# Patient Record
Sex: Female | Born: 1945 | Race: Black or African American | Hispanic: No | Smoking: Never smoker
Health system: Southern US, Community
[De-identification: ages and names within clinical notes are randomized; demographics above are authoritative.]

## PROBLEM LIST (undated history)

## (undated) ENCOUNTER — Emergency Department (HOSPITAL_COMMUNITY): Admission: EM | Payer: Medicare HMO

## (undated) DIAGNOSIS — G459 Transient cerebral ischemic attack, unspecified: Secondary | ICD-10-CM

## (undated) DIAGNOSIS — J309 Allergic rhinitis, unspecified: Secondary | ICD-10-CM

## (undated) DIAGNOSIS — E559 Vitamin D deficiency, unspecified: Secondary | ICD-10-CM

## (undated) DIAGNOSIS — R040 Epistaxis: Secondary | ICD-10-CM

## (undated) DIAGNOSIS — M85852 Other specified disorders of bone density and structure, left thigh: Secondary | ICD-10-CM

## (undated) DIAGNOSIS — I10 Essential (primary) hypertension: Secondary | ICD-10-CM

## (undated) DIAGNOSIS — M1A9XX0 Chronic gout, unspecified, without tophus (tophi): Secondary | ICD-10-CM

## (undated) DIAGNOSIS — E441 Mild protein-calorie malnutrition: Secondary | ICD-10-CM

## (undated) DIAGNOSIS — K5901 Slow transit constipation: Secondary | ICD-10-CM

## (undated) DIAGNOSIS — E162 Hypoglycemia, unspecified: Secondary | ICD-10-CM

## (undated) DIAGNOSIS — M1990 Unspecified osteoarthritis, unspecified site: Secondary | ICD-10-CM

## (undated) DIAGNOSIS — D509 Iron deficiency anemia, unspecified: Secondary | ICD-10-CM

## (undated) DIAGNOSIS — F22 Delusional disorders: Secondary | ICD-10-CM

## (undated) DIAGNOSIS — K5790 Diverticulosis of intestine, part unspecified, without perforation or abscess without bleeding: Secondary | ICD-10-CM

## (undated) DIAGNOSIS — Z950 Presence of cardiac pacemaker: Secondary | ICD-10-CM

## (undated) DIAGNOSIS — Z7901 Long term (current) use of anticoagulants: Secondary | ICD-10-CM

## (undated) DIAGNOSIS — G4730 Sleep apnea, unspecified: Secondary | ICD-10-CM

## (undated) DIAGNOSIS — I509 Heart failure, unspecified: Secondary | ICD-10-CM

## (undated) DIAGNOSIS — I4891 Unspecified atrial fibrillation: Secondary | ICD-10-CM

## (undated) DIAGNOSIS — K922 Gastrointestinal hemorrhage, unspecified: Secondary | ICD-10-CM

## (undated) DIAGNOSIS — D6859 Other primary thrombophilia: Secondary | ICD-10-CM

## (undated) DIAGNOSIS — K220 Achalasia of cardia: Secondary | ICD-10-CM

## (undated) DIAGNOSIS — N189 Chronic kidney disease, unspecified: Secondary | ICD-10-CM

## (undated) DIAGNOSIS — R1312 Dysphagia, oropharyngeal phase: Secondary | ICD-10-CM

## (undated) DIAGNOSIS — M069 Rheumatoid arthritis, unspecified: Secondary | ICD-10-CM

## (undated) DIAGNOSIS — M329 Systemic lupus erythematosus, unspecified: Secondary | ICD-10-CM

## (undated) DIAGNOSIS — I7300 Raynaud's syndrome without gangrene: Secondary | ICD-10-CM

## (undated) DIAGNOSIS — M1A00X0 Idiopathic chronic gout, unspecified site, without tophus (tophi): Secondary | ICD-10-CM

## (undated) DIAGNOSIS — F0390 Unspecified dementia without behavioral disturbance: Secondary | ICD-10-CM

## (undated) DIAGNOSIS — R569 Unspecified convulsions: Secondary | ICD-10-CM

## (undated) DIAGNOSIS — D649 Anemia, unspecified: Secondary | ICD-10-CM

## (undated) DIAGNOSIS — J8410 Pulmonary fibrosis, unspecified: Secondary | ICD-10-CM

## (undated) DIAGNOSIS — I429 Cardiomyopathy, unspecified: Secondary | ICD-10-CM

## (undated) DIAGNOSIS — Z9289 Personal history of other medical treatment: Secondary | ICD-10-CM

## (undated) HISTORY — DX: Epistaxis: R04.0

## (undated) HISTORY — DX: Iron deficiency anemia, unspecified: D50.9

## (undated) HISTORY — DX: Rheumatoid arthritis, unspecified: M06.9

## (undated) HISTORY — DX: Chronic gout, unspecified, without tophus (tophi): M1A.9XX0

## (undated) HISTORY — DX: Mild protein-calorie malnutrition: E44.1

## (undated) HISTORY — DX: Transient cerebral ischemic attack, unspecified: G45.9

## (undated) HISTORY — PX: HEMORRHOID SURGERY: SHX153

## (undated) HISTORY — DX: Delusional disorders: F22

## (undated) HISTORY — DX: Achalasia of cardia: K22.0

## (undated) HISTORY — DX: Slow transit constipation: K59.01

## (undated) HISTORY — DX: Hypoglycemia, unspecified: E16.2

## (undated) HISTORY — DX: Unspecified convulsions: R56.9

## (undated) HISTORY — DX: Gastrointestinal hemorrhage, unspecified: K92.2

## (undated) HISTORY — DX: Long term (current) use of anticoagulants: Z79.01

## (undated) HISTORY — DX: Other specified disorders of bone density and structure, left thigh: M85.852

## (undated) HISTORY — PX: PACEMAKER INSERTION: SHX728

## (undated) HISTORY — DX: Idiopathic chronic gout, unspecified site, without tophus (tophi): M1A.00X0

## (undated) HISTORY — PX: BREAST BIOPSY: SHX20

## (undated) HISTORY — PX: TONSILLECTOMY: SUR1361

## (undated) HISTORY — DX: Raynaud's syndrome without gangrene: I73.00

## (undated) HISTORY — DX: Allergic rhinitis, unspecified: J30.9

## (undated) HISTORY — PX: CARDIAC VALVE SURGERY: SHX40

## (undated) HISTORY — DX: Other primary thrombophilia: D68.59

## (undated) HISTORY — DX: Dysphagia, oropharyngeal phase: R13.12

## (undated) HISTORY — PX: CARPAL TUNNEL RELEASE: SHX101

---

## 2002-03-26 DIAGNOSIS — I359 Nonrheumatic aortic valve disorder, unspecified: Secondary | ICD-10-CM

## 2002-03-26 HISTORY — DX: Nonrheumatic aortic valve disorder, unspecified: I35.9

## 2017-08-18 DIAGNOSIS — I4729 Other ventricular tachycardia: Secondary | ICD-10-CM

## 2017-08-18 DIAGNOSIS — I442 Atrioventricular block, complete: Secondary | ICD-10-CM | POA: Insufficient documentation

## 2017-08-18 DIAGNOSIS — I10 Essential (primary) hypertension: Secondary | ICD-10-CM

## 2017-08-18 HISTORY — DX: Atrioventricular block, complete: I44.2

## 2017-08-18 HISTORY — DX: Essential (primary) hypertension: I10

## 2017-08-18 HISTORY — DX: Other ventricular tachycardia: I47.29

## 2017-12-12 DIAGNOSIS — M25552 Pain in left hip: Secondary | ICD-10-CM

## 2017-12-12 HISTORY — DX: Pain in left hip: M25.552

## 2019-11-14 DIAGNOSIS — Z8679 Personal history of other diseases of the circulatory system: Secondary | ICD-10-CM

## 2019-11-14 DIAGNOSIS — E7849 Other hyperlipidemia: Secondary | ICD-10-CM

## 2019-11-14 DIAGNOSIS — I080 Rheumatic disorders of both mitral and aortic valves: Secondary | ICD-10-CM | POA: Insufficient documentation

## 2019-11-14 DIAGNOSIS — Z95810 Presence of automatic (implantable) cardiac defibrillator: Secondary | ICD-10-CM

## 2019-11-14 HISTORY — DX: Other hyperlipidemia: E78.49

## 2019-11-14 HISTORY — DX: Personal history of other diseases of the circulatory system: Z86.79

## 2019-11-14 HISTORY — DX: Rheumatic disorders of both mitral and aortic valves: I08.0

## 2020-03-10 DIAGNOSIS — A419 Sepsis, unspecified organism: Secondary | ICD-10-CM

## 2020-03-10 HISTORY — DX: Sepsis, unspecified organism: A41.9

## 2020-04-14 ENCOUNTER — Institutional Professional Consult (permissible substitution): Payer: Self-pay | Admitting: Pulmonary Disease

## 2020-05-01 ENCOUNTER — Ambulatory Visit (INDEPENDENT_AMBULATORY_CARE_PROVIDER_SITE_OTHER): Payer: Medicare HMO | Admitting: Pulmonary Disease

## 2020-05-01 ENCOUNTER — Other Ambulatory Visit: Payer: Self-pay

## 2020-05-01 ENCOUNTER — Encounter: Payer: Self-pay | Admitting: Pulmonary Disease

## 2020-05-01 DIAGNOSIS — I509 Heart failure, unspecified: Secondary | ICD-10-CM | POA: Insufficient documentation

## 2020-05-01 DIAGNOSIS — R809 Proteinuria, unspecified: Secondary | ICD-10-CM

## 2020-05-01 DIAGNOSIS — R928 Other abnormal and inconclusive findings on diagnostic imaging of breast: Secondary | ICD-10-CM | POA: Diagnosis not present

## 2020-05-01 DIAGNOSIS — M329 Systemic lupus erythematosus, unspecified: Secondary | ICD-10-CM | POA: Diagnosis not present

## 2020-05-01 DIAGNOSIS — I071 Rheumatic tricuspid insufficiency: Secondary | ICD-10-CM

## 2020-05-01 DIAGNOSIS — K5730 Diverticulosis of large intestine without perforation or abscess without bleeding: Secondary | ICD-10-CM

## 2020-05-01 DIAGNOSIS — K8020 Calculus of gallbladder without cholecystitis without obstruction: Secondary | ICD-10-CM | POA: Insufficient documentation

## 2020-05-01 DIAGNOSIS — I9589 Other hypotension: Secondary | ICD-10-CM | POA: Insufficient documentation

## 2020-05-01 DIAGNOSIS — D6869 Other thrombophilia: Secondary | ICD-10-CM

## 2020-05-01 DIAGNOSIS — E46 Unspecified protein-calorie malnutrition: Secondary | ICD-10-CM | POA: Insufficient documentation

## 2020-05-01 DIAGNOSIS — I4891 Unspecified atrial fibrillation: Secondary | ICD-10-CM | POA: Insufficient documentation

## 2020-05-01 DIAGNOSIS — D8481 Immunodeficiency due to conditions classified elsewhere: Secondary | ICD-10-CM | POA: Insufficient documentation

## 2020-05-01 DIAGNOSIS — E261 Secondary hyperaldosteronism: Secondary | ICD-10-CM

## 2020-05-01 DIAGNOSIS — E2749 Other adrenocortical insufficiency: Secondary | ICD-10-CM

## 2020-05-01 DIAGNOSIS — E559 Vitamin D deficiency, unspecified: Secondary | ICD-10-CM

## 2020-05-01 DIAGNOSIS — N183 Chronic kidney disease, stage 3 unspecified: Secondary | ICD-10-CM

## 2020-05-01 DIAGNOSIS — J84112 Idiopathic pulmonary fibrosis: Secondary | ICD-10-CM

## 2020-05-01 DIAGNOSIS — I5032 Chronic diastolic (congestive) heart failure: Secondary | ICD-10-CM

## 2020-05-01 DIAGNOSIS — D638 Anemia in other chronic diseases classified elsewhere: Secondary | ICD-10-CM | POA: Diagnosis not present

## 2020-05-01 DIAGNOSIS — M169 Osteoarthritis of hip, unspecified: Secondary | ICD-10-CM

## 2020-05-01 DIAGNOSIS — I700 Atherosclerosis of aorta: Secondary | ICD-10-CM | POA: Insufficient documentation

## 2020-05-01 DIAGNOSIS — D849 Immunodeficiency, unspecified: Secondary | ICD-10-CM | POA: Insufficient documentation

## 2020-05-01 DIAGNOSIS — K219 Gastro-esophageal reflux disease without esophagitis: Secondary | ICD-10-CM

## 2020-05-01 DIAGNOSIS — I10 Essential (primary) hypertension: Secondary | ICD-10-CM

## 2020-05-01 DIAGNOSIS — I480 Paroxysmal atrial fibrillation: Secondary | ICD-10-CM | POA: Insufficient documentation

## 2020-05-01 DIAGNOSIS — M351 Other overlap syndromes: Secondary | ICD-10-CM

## 2020-05-01 DIAGNOSIS — M179 Osteoarthritis of knee, unspecified: Secondary | ICD-10-CM

## 2020-05-01 DIAGNOSIS — M174 Other bilateral secondary osteoarthritis of knee: Secondary | ICD-10-CM

## 2020-05-01 DIAGNOSIS — I429 Cardiomyopathy, unspecified: Secondary | ICD-10-CM | POA: Insufficient documentation

## 2020-05-01 DIAGNOSIS — M1611 Unilateral primary osteoarthritis, right hip: Secondary | ICD-10-CM

## 2020-05-01 DIAGNOSIS — E441 Mild protein-calorie malnutrition: Secondary | ICD-10-CM

## 2020-05-01 HISTORY — DX: Chronic kidney disease, stage 3 unspecified: N18.30

## 2020-05-01 HISTORY — DX: Osteoarthritis of hip, unspecified: M16.9

## 2020-05-01 HISTORY — DX: Rheumatic tricuspid insufficiency: I07.1

## 2020-05-01 HISTORY — DX: Immunodeficiency due to conditions classified elsewhere: D84.81

## 2020-05-01 HISTORY — DX: Unspecified atrial fibrillation: I48.91

## 2020-05-01 HISTORY — DX: Other thrombophilia: D68.69

## 2020-05-01 HISTORY — DX: Diverticulosis of large intestine without perforation or abscess without bleeding: K57.30

## 2020-05-01 HISTORY — DX: Idiopathic pulmonary fibrosis: J84.112

## 2020-05-01 HISTORY — DX: Proteinuria, unspecified: R80.9

## 2020-05-01 HISTORY — DX: Other adrenocortical insufficiency: E27.49

## 2020-05-01 HISTORY — DX: Systemic lupus erythematosus, unspecified: M32.9

## 2020-05-01 HISTORY — DX: Atherosclerosis of aorta: I70.0

## 2020-05-01 HISTORY — DX: Chronic diastolic (congestive) heart failure: I50.32

## 2020-05-01 HISTORY — DX: Osteoarthritis of knee, unspecified: M17.9

## 2020-05-01 HISTORY — DX: Calculus of gallbladder without cholecystitis without obstruction: K80.20

## 2020-05-01 HISTORY — DX: Gastro-esophageal reflux disease without esophagitis: K21.9

## 2020-05-01 HISTORY — DX: Secondary hyperaldosteronism: E26.1

## 2020-05-01 LAB — CBC
HCT: 26.3 % — ABNORMAL LOW (ref 36.0–46.0)
Hemoglobin: 8.8 g/dL — ABNORMAL LOW (ref 12.0–15.0)
MCHC: 33.6 g/dL (ref 30.0–36.0)
MCV: 93.8 fl (ref 78.0–100.0)
Platelets: 165 10*3/uL (ref 150.0–400.0)
RBC: 2.8 Mil/uL — ABNORMAL LOW (ref 3.87–5.11)
RDW: 16.4 % — ABNORMAL HIGH (ref 11.5–15.5)
WBC: 5.9 10*3/uL (ref 4.0–10.5)

## 2020-05-01 NOTE — Patient Instructions (Signed)
Nice to meet you  We will get blood work today to make sure your blood count is okay given the report of dark stool  Please schedule a 6-minute walk test at your convenience in the coming days when you check out.  This will check if your oxygen is dropping, I will also be useful to follow how far you walk if we need to do additional things in the future.  Please make sure to bring your Rollator and use it during this walk.  Return to clinic or follow-up with [name] in 3 months, or sooner as needed

## 2020-05-04 NOTE — Progress Notes (Signed)
@Patient  ID: , female    DOB: 02-02-1945, 75 y.o.   MRN: [id number]  Chief Complaint  Patient presents with   Consult    Winded easily    Referring provider: No ref. provider found  HPI:   75 year old woman whom I am seeing in consultation at the request of self for evaluation and ongoing care of multiple pulmonary issues.  PCP note reviewed.  Patient recently [place] the area from [place].  Had multiple specialist including cardiology, pulmonary.  Chronic dyspnea for some time.  Diagnosed with RA related ILD.  Started on Ofev in the past.  Continues this.  Occasional diarrhea.  Dyspnea worse on inclines or stairs.  Present on flat surfaces over short distances as well.  No time of day when things are better or worse.  Dyspnea constant.  No seasonal environmental factors she can identify that make this better or worse.  No position make things better or worse.  No clear alleviating exacerbating factors.  She continues on inhalers.  Currently Trelegy.  PMH: ILD, valvular issues, heart issues, Surgical history: Heart valve surgery, pacemaker placement, tonsillectomy Family history: Mother had CAD, hypertension Father with CAD, hypertension Social history: Never smoker, recently moved to the area, lives in [place] / Pulmonary Flowsheets:   ACT:  No flowsheet data found.  MMRC: No flowsheet data found.  Epworth:  No flowsheet data found.  Tests:   FENO:  No results found for: NITRICOXIDE  PFT: No flowsheet data found.  WALK:  No flowsheet data found.  Imaging: None reviewed, requested  Lab Results:  CBC Personally reviewed most recent labs from PCP with minimal records sent    Component Value Date/Time   WBC 5.9 05/01/2020 1124   RBC 2.80 (L) 05/01/2020 1124   HGB 8.8 Repeated and verified X2. (L) 05/01/2020 1124   HCT 26.3 Repeated and verified X2. (L) 05/01/2020 1124   PLT 165.0 05/01/2020 1124   MCV 93.8 05/01/2020 1124    MCHC 33.6 05/01/2020 1124   RDW 16.4 (H) 05/01/2020 1124    BMET No results found for: NA, K, CL, CO2, GLUCOSE, BUN, CREATININE, CALCIUM, GFRNONAA, GFRAA  BNP No results found for: BNP  ProBNP No results found for: PROBNP  Specialty Problems       Pulmonary Problems   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HCC)       Not on File   There is no immunization history on file for this patient.  History reviewed. No pertinent past medical history.  Tobacco History: Social History   Tobacco Use  Smoking Status Never Smoker  Smokeless Tobacco Never Used   Counseling given: Yes   Continue to not smoke  Outpatient Encounter Medications as of 05/01/2020  Medication Sig   acetaminophen (TYLENOL 8 HOUR) 650 MG CR tablet Take 650 mg by mouth every 8 (eight) hours as needed for pain.   albuterol (VENTOLIN HFA) 108 (90 Base) MCG/ACT inhaler Inhale 2 puffs into the lungs every 6 (six) hours as needed for wheezing or shortness of breath.   allopurinol (ZYLOPRIM) 100 MG tablet Take 100 mg by mouth daily.   apixaban (ELIQUIS) 2.5 MG TABS tablet Take 2.5 mg by mouth 2 (two) times daily.   calcium carbonate (OSCAL) 1500 (600 Ca) MG TABS tablet Take by mouth 2 (two) times daily with a meal.   cetirizine HCl (ZYRTEC) 5 MG/5ML SOLN Take 5 mg by mouth daily.   feeding supplement (ENSURE IMMUNE HEALTH) LIQD Take 237 mLs  by mouth 3 (three) times daily with meals.   fluticasone (FLONASE) 50 MCG/ACT nasal spray Place 2 sprays into both nostrils daily.   Fluticasone-Umeclidin-Vilant (TRELEGY ELLIPTA) 100-62.5-25 MCG/INH AEPB Inhale into the lungs.   folic acid (FOLVITE) 800 MCG tablet Take 400 mcg by mouth daily.   furosemide (LASIX) 20 MG tablet Take 20 mg by mouth.   hydrocortisone (CORTEF) 5 MG tablet Take 5 mg by mouth 2 (two) times daily.   hydroxychloroquine (PLAQUENIL) 200 MG tablet Take 200 mg by mouth daily.   levothyroxine (SYNTHROID) 75 MCG tablet Take 75 mcg by mouth daily before  breakfast.   metoprolol tartrate (LOPRESSOR) 25 MG tablet Take 25 mg by mouth daily.   Multiple Vitamin (MULTIVITAMIN) LIQD Take 5 mLs by mouth daily.   Nintedanib (OFEV) 150 MG CAPS Take 150 mg by mouth 2 (two) times daily.   [DISCONTINUED] hydrocortisone cream 0.5 % Apply 1 application topically 2 (two) times daily.   No facility-administered encounter medications on file as of 05/01/2020.     Review of Systems  Review of Systems  No chest pain with exertion.  No orthopnea or PND.  She endorses lower extremity swelling that fluctuates.  Comprehensive review of systems otherwise negative. Physical Exam  BP 118/74 (BP Location: Left Arm, Cuff Size: Normal)   Pulse 71   Temp 99 F (37.2 C) (Oral)   Ht 5' 3.5" (1.613 m)   Wt 128 lb 6.4 oz (58.2 kg)   SpO2 99%   BMI 22.39 kg/m   Wt Readings from Last 5 Encounters:  05/01/20 128 lb 6.4 oz (58.2 kg)    BMI Readings from Last 5 Encounters:  05/01/20 22.39 kg/m     Physical Exam General: Frail, sitting in chair Eyes: EOMI, no icterus Neck: Supple, no JVP Cardiovascular: Significant 3/6 late systolic murmur heard best right upper sternal border, regular rate Pulmonary: Crackles in bilateral bases, otherwise clear to auscultation bilaterally Abdomen: Nondistended, bowel sounds present MSK: No synovitis, joint effusion Neuro: Ambulates with assistance of walker, otherwise no weakness Psych: Normal mood, full affect   Assessment & Plan:   ILD: Presumed IPF, on Ofev.  To continue this.  Consider repeat CT scan in future if symptoms worsen.  We will need to track down prior films for comparison.  6-minute walk test ordered to be completed in the coming days to assess for hypoxemia and as baseline so he can compare in future if symptoms worsen.  Dyspnea exertion: Related to ILD, pulmonary hypertension.  On therapy for ILD as above.  Continue Trelegy.  Consider PFTs in the future if symptoms worsen.  Pulmonary hypertension:  Presumed based on prior TTE.  Never on therapy.  Denies right heart cath.  Reportedly significant valvular disease, like combination  of group 2 and group 3.  She is poor candidate for therapy based on her age, frailty.  Do not see a reason to pursue right heart cath at this time but can reconsider in the future.  Patient presented with release of records, this was signed and filled out today requested prior notes, radiology reports, films, PFTs etc.  We will look for these results in the coming days to weeks.  Will need to be scanned into the media tab so can reference them in the future.   Return in about 3 months (around 07/31/2020).   Karren Burly, MD 05/04/2020

## 2020-05-12 ENCOUNTER — Emergency Department (HOSPITAL_COMMUNITY)
Admission: EM | Admit: 2020-05-12 | Discharge: 2020-05-12 | Disposition: A | Discharge status: Home / Self Care | Payer: Medicare HMO | Attending: Emergency Medicine | Admitting: Emergency Medicine

## 2020-05-12 ENCOUNTER — Other Ambulatory Visit: Payer: Self-pay

## 2020-05-12 ENCOUNTER — Encounter (HOSPITAL_COMMUNITY): Payer: Self-pay | Admitting: *Deleted

## 2020-05-12 ENCOUNTER — Emergency Department (HOSPITAL_COMMUNITY): Payer: Medicare HMO

## 2020-05-12 DIAGNOSIS — N183 Chronic kidney disease, stage 3 unspecified: Secondary | ICD-10-CM | POA: Diagnosis not present

## 2020-05-12 DIAGNOSIS — I509 Heart failure, unspecified: Secondary | ICD-10-CM | POA: Diagnosis not present

## 2020-05-12 DIAGNOSIS — Z7901 Long term (current) use of anticoagulants: Secondary | ICD-10-CM | POA: Insufficient documentation

## 2020-05-12 DIAGNOSIS — I4891 Unspecified atrial fibrillation: Secondary | ICD-10-CM | POA: Insufficient documentation

## 2020-05-12 DIAGNOSIS — I130 Hypertensive heart and chronic kidney disease with heart failure and stage 1 through stage 4 chronic kidney disease, or unspecified chronic kidney disease: Secondary | ICD-10-CM | POA: Insufficient documentation

## 2020-05-12 DIAGNOSIS — Z79899 Other long term (current) drug therapy: Secondary | ICD-10-CM | POA: Diagnosis not present

## 2020-05-12 DIAGNOSIS — Z4502 Encounter for adjustment and management of automatic implantable cardiac defibrillator: Secondary | ICD-10-CM | POA: Insufficient documentation

## 2020-05-12 DIAGNOSIS — Z7189 Other specified counseling: Secondary | ICD-10-CM

## 2020-05-12 HISTORY — DX: Pulmonary fibrosis, unspecified: J84.10

## 2020-05-12 HISTORY — DX: Chronic kidney disease, unspecified: N18.9

## 2020-05-12 HISTORY — DX: Unspecified atrial fibrillation: I48.91

## 2020-05-12 HISTORY — DX: Cardiomyopathy, unspecified: I42.9

## 2020-05-12 HISTORY — DX: Essential (primary) hypertension: I10

## 2020-05-12 HISTORY — DX: Diverticulosis of intestine, part unspecified, without perforation or abscess without bleeding: K57.90

## 2020-05-12 HISTORY — DX: Vitamin D deficiency, unspecified: E55.9

## 2020-05-12 HISTORY — DX: Systemic lupus erythematosus, unspecified: M32.9

## 2020-05-12 HISTORY — DX: Unspecified osteoarthritis, unspecified site: M19.90

## 2020-05-12 HISTORY — DX: Anemia, unspecified: D64.9

## 2020-05-12 HISTORY — DX: Heart failure, unspecified: I50.9

## 2020-05-12 LAB — TROPONIN I (HIGH SENSITIVITY)
Troponin I (High Sensitivity): 66 ng/L — ABNORMAL HIGH (ref <18)
Troponin I (High Sensitivity): 61 ng/L — ABNORMAL HIGH (ref <18)

## 2020-05-12 LAB — CBC WITH DIFFERENTIAL/PLATELET
Abs Immature Granulocytes: 0.03 10*3/uL (ref 0.00–0.07)
Basophils Absolute: 0 10*3/uL (ref 0.0–0.1)
Basophils Relative: 0 %
Eosinophils Absolute: 0.1 10*3/uL (ref 0.0–0.5)
Eosinophils Relative: 1 %
HCT: 27.6 % — ABNORMAL LOW (ref 36.0–46.0)
Hemoglobin: 8.8 g/dL — ABNORMAL LOW (ref 12.0–15.0)
Immature Granulocytes: 1 %
Lymphocytes Relative: 26 %
Lymphs Abs: 1.6 10*3/uL (ref 0.7–4.0)
MCH: 31.2 pg (ref 26.0–34.0)
MCHC: 31.9 g/dL (ref 30.0–36.0)
MCV: 97.9 fL (ref 80.0–100.0)
Monocytes Absolute: 0.5 10*3/uL (ref 0.1–1.0)
Monocytes Relative: 7 %
Neutro Abs: 4.1 10*3/uL (ref 1.7–7.7)
Neutrophils Relative %: 65 %
Platelets: 121 10*3/uL — ABNORMAL LOW (ref 150–400)
RBC: 2.82 MIL/uL — ABNORMAL LOW (ref 3.87–5.11)
RDW: 16.1 % — ABNORMAL HIGH (ref 11.5–15.5)
WBC: 6.4 10*3/uL (ref 4.0–10.5)
nRBC: 0 % (ref 0.0–0.2)

## 2020-05-12 LAB — BASIC METABOLIC PANEL
Anion gap: 8 (ref 5–15)
BUN: 17 mg/dL (ref 8–23)
CO2: 27 mmol/L (ref 22–32)
Calcium: 9.2 mg/dL (ref 8.9–10.3)
Chloride: 102 mmol/L (ref 98–111)
Creatinine, Ser: 0.96 mg/dL (ref 0.44–1.00)
GFR, Estimated: >60 mL/min (ref >60)
Glucose, Bld: 83 mg/dL (ref 70–99)
Potassium: 3.4 mmol/L — ABNORMAL LOW (ref 3.5–5.1)
Sodium: 137 mmol/L (ref 135–145)

## 2020-05-12 LAB — BRAIN NATRIURETIC PEPTIDE: B Natriuretic Peptide: 567.7 pg/mL — ABNORMAL HIGH (ref 0.0–100.0)

## 2020-05-12 LAB — MAGNESIUM: Magnesium: 1.8 mg/dL (ref 1.7–2.4)

## 2020-05-12 MED ORDER — FUROSEMIDE 10 MG/ML IJ SOLN
20.0000 mg | Freq: Once | INTRAMUSCULAR | Status: AC
  Administered 2020-05-12: 20 mg via INTRAVENOUS
  Filled 2020-05-12: qty 2

## 2020-05-12 NOTE — ED Provider Notes (Signed)
[facility] EMERGENCY DEPARTMENT Provider Note   CSN: [id number] Arrival date & time: 05/12/20  1615     History Chief Complaint  Patient presents with  . Illness    Lindsey Huber is a 75 y.o. female.  The history is provided by the patient.  Illness Location:  Pacemaker Severity:  Mild Onset quality:  Sudden Timing:  Unable to specify Progression:  Unable to specify Chronicity:  New Context:  Patient was notified by her cardiologist in [place] (just move here and to establish care with Dr. Leta Jungling next week) that her ICD was having an issue and instructed to come to ED. Patient told ICD was trying to fire but patient denies any symtpoms or shocks. Relieved by:  Nothing Worsened by:  Nothing Associated symptoms: no abdominal pain, no chest pain, no congestion, no cough, no diarrhea, no ear pain, no fatigue, no fever, no headaches, no loss of consciousness, no myalgias, no nausea, no rash, no rhinorrhea, no shortness of breath, no sore throat, no vomiting and no wheezing   Risk factors:  Cardiomyopathy, afib on blood thinner      Past Medical History:  Diagnosis Date  . A-fib (HCC)   . Anemia   . Cardiomyopathy (HCC)   . CHF (congestive heart failure) (HCC)   . CKD (chronic kidney disease)   . Diverticulosis   . Hypertension   . Lupus (HCC)   . Osteoarthritis   . Pulmonary fibrosis (HCC)   . Vitamin D deficiency     Patient Active Problem List   Diagnosis Date Noted  . 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  . Vitamin D deficiency 05/01/2020  . Mammogram abnormal 05/01/2020  . Proteinuria 05/01/2020  . Other bilateral secondary osteoarthritis of knee 05/01/2020  . Osteoarthritis of hip 05/01/2020  . HTN (hypertension) 05/01/2020  . GERD without esophagitis 05/01/2020  . 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HCC) 05/01/2020  . Cardiomyopathy (HCC) 05/01/2020  . Iatrogenic hypotension 05/01/2020  . Hypercoagulability due to atrial  fibrillation (HCC) 05/01/2020  . Systemic lupus erythematosus (HCC) 05/01/2020  . Chronic heart failure with preserved ejection fraction (HCC) 05/01/2020  . 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  . Paroxysmal atrial fibrillation with RVR (HCC) 05/01/2020  . Protein calorie malnutrition (HCC) 05/01/2020  . Chronic kidney disease (CKD), active medical management without dialysis, stage 3 (moderate) (HCC) 05/01/2020  . 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HCC) 05/01/2020  . Non-rheumatic atrial fibrillation (HCC) 05/01/2020  . Atherosclerosis of abdominal aorta (HCC) 05/01/2020  . Diverticulosis of colon 05/01/2020  . Cholelithiasis 05/01/2020  . 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 Anemia of chronic disease 05/01/2020    Past Surgical History:  Procedure Laterality Date  . PACEMAKER INSERTION       OB History   No obstetric history on file.     No family history on file.  Social History   Tobacco Use  . Smoking status: Never Smoker  . Smokeless tobacco: Never Used    Home Medications Prior to Admission medications   Medication Sig Start Date End Date Taking? Authorizing Provider  acetaminophen (TYLENOL 8 HOUR) 650 MG CR tablet Take 650 mg by mouth every 8 (eight) hours as needed for pain.    [provider]  albuterol (VENTOLIN HFA) 108 (90 Base) MCG/ACT inhaler Inhale 2 puffs into the lungs every 6 (six) hours as needed for wheezing or shortness of breath.    [provider]  allopurinol (ZYLOPRIM) 100 MG tablet Take 100 mg  by mouth daily.    [provider]  apixaban (ELIQUIS) 2.5 MG TABS tablet Take 2.5 mg by mouth 2 (two) times daily.    [provider]  calcium carbonate (OSCAL) 1500 (600 Ca) MG TABS tablet Take by mouth 2 (two) times daily with a meal.    [provider]  cetirizine HCl (ZYRTEC) 5 MG/5ML SOLN Take 5 mg by mouth daily.    [provider]   feeding supplement (ENSURE IMMUNE HEALTH) LIQD Take 237 mLs by mouth 3 (three) times daily with meals.    [provider]  fluticasone (FLONASE) 50 MCG/ACT nasal spray Place 2 sprays into both nostrils daily.    [provider]  Fluticasone-Umeclidin-Vilant (TRELEGY ELLIPTA) 100-62.5-25 MCG/INH AEPB Inhale into the lungs.    [provider]  folic acid (FOLVITE) 800 MCG tablet Take 400 mcg by mouth daily.    [provider]  furosemide (LASIX) 20 MG tablet Take 20 mg by mouth.    [provider]  hydrocortisone (CORTEF) 5 MG tablet Take 5 mg by mouth 2 (two) times daily.    [provider]  hydroxychloroquine (PLAQUENIL) 200 MG tablet Take 200 mg by mouth daily.    [provider]  levothyroxine (SYNTHROID) 75 MCG tablet Take 75 mcg by mouth daily before breakfast.    [provider]  metoprolol tartrate (LOPRESSOR) 25 MG tablet Take 25 mg by mouth daily.    [provider]  Multiple Vitamin (MULTIVITAMIN) LIQD Take 5 mLs by mouth daily.    [provider]  Nintedanib (OFEV) 150 MG CAPS Take 150 mg by mouth 2 (two) times daily.    [provider]    Allergies    Norvasc [amlodipine]  Review of Systems   Review of Systems  Constitutional: Negative for chills, fatigue and fever.  HENT: Negative for congestion, ear pain, rhinorrhea and sore throat.   Eyes: Negative for pain and visual disturbance.  Respiratory: Negative for cough, shortness of breath and wheezing.   Cardiovascular: Negative for chest pain and palpitations.  Gastrointestinal: Negative for abdominal pain, diarrhea, nausea and vomiting.  Genitourinary: Negative for dysuria and hematuria.  Musculoskeletal: Negative for arthralgias, back pain and myalgias.  Skin: Negative for color change and rash.  Neurological: Negative for seizures, loss of consciousness, syncope and headaches.  All other systems reviewed and are  negative.   Physical Exam Updated Vital Signs  ED Triage Vitals  Enc Vitals Group     BP 05/12/20 1620 (!) 152/75     Pulse Rate 05/12/20 1620 74     Resp 05/12/20 1620 17     Temp 05/12/20 1620 (!) 97.3 F (36.3 C)     Temp Source 05/12/20 1620 Oral     SpO2 05/12/20 1620 100 %     Weight --      Height --      Head Circumference --      Peak Flow --      Pain Score 05/12/20 1629 0     Pain Loc --      Pain Edu? --      Excl. in GC? --     Physical Exam Vitals and nursing note reviewed.  Constitutional:      General: She is not in acute distress.    Appearance: She is well-developed. She is not ill-appearing.  HENT:     Head: Normocephalic and atraumatic.     Nose: Nose normal.  Mouth/Throat:     Mouth: Mucous membranes are moist.  Eyes:     Extraocular Movements: Extraocular movements intact.     Conjunctiva/sclera: Conjunctivae normal.     Pupils: Pupils are equal, round, and reactive to light.  Cardiovascular:     Rate and Rhythm: Normal rate and regular rhythm.     Pulses: Normal pulses.     Heart sounds: Normal heart sounds. No murmur heard.   Pulmonary:     Effort: Pulmonary effort is normal. No respiratory distress.     Breath sounds: Normal breath sounds.  Abdominal:     Palpations: Abdomen is soft.     Tenderness: There is no abdominal tenderness.  Musculoskeletal:        General: Normal range of motion.     Cervical back: Normal range of motion and neck supple.  Skin:    General: Skin is warm and dry.     Capillary Refill: Capillary refill takes less than 2 seconds.  Neurological:     General: No focal deficit present.     Mental Status: She is alert and oriented to person, place, and time.     Cranial Nerves: No cranial nerve deficit.     Sensory: No sensory deficit.     Motor: No weakness.     Coordination: Coordination normal.     ED Results / Procedures / Treatments   Labs (all labs ordered are listed, but only abnormal results  are displayed) Labs Reviewed  CBC WITH DIFFERENTIAL/PLATELET - Abnormal; Notable for the following components:      Result Value   RBC 2.82 (*)    Hemoglobin 8.8 (*)    HCT 27.6 (*)    RDW 16.1 (*)    Platelets 121 (*)    All other components within normal limits  BASIC METABOLIC PANEL - Abnormal; Notable for the following components:   Potassium 3.4 (*)    All other components within normal limits  BRAIN NATRIURETIC PEPTIDE - Abnormal; Notable for the following components:   B Natriuretic Peptide 567.7 (*)    All other components within normal limits  TROPONIN I (HIGH SENSITIVITY) - Abnormal; Notable for the following components:   Troponin I (High Sensitivity) 66 (*)    All other components within normal limits  TROPONIN I (HIGH SENSITIVITY) - Abnormal; Notable for the following components:   Troponin I (High Sensitivity) 61 (*)    All other components within normal limits  MAGNESIUM    EKG EKG Interpretation  Date/Time:  Monday May 12 2020 16:24:44 EDT Ventricular Rate:  71 PR Interval:  336 QRS Duration: 168 QT Interval:  512 QTC Calculation: 556 R Axis:   -79 Text Interpretation: Atrial-sensed ventricular-paced rhythm with prolonged AV conduction Abnormal ECG No previous ECGs available Confirmed by Tilden Fossa [phone]) on 05/12/2020 4:36:49 PM   Radiology DG Chest 2 View  Result Date: 05/12/2020 CLINICAL DATA:  Arrhythmia EXAM: CHEST - 2 VIEW COMPARISON:  None. FINDINGS: Cardiac shadow is enlarged. Postsurgical changes and defibrillator are noted. Lungs are well aerated bilaterally. Mild vascular prominence is seen. Mild bibasilar atelectatic changes are noted. No bony abnormality is seen. IMPRESSION: Mild vascular congestion. Mild basilar atelectasis. Electronically Signed   By: Alcide Clever M.D.   On: 05/12/2020 16:55    Procedures Procedures   Medications Ordered in ED Medications  furosemide (LASIX) injection 20 mg (20 mg Intravenous Given 05/12/20 2100)     ED Course  I have reviewed the triage vital signs and  the nursing notes.  Pertinent labs & imaging results that were available during my care of the patient were reviewed by me and considered in my medical decision making (see chart for details).    MDM Rules/Calculators/A&P                          Jacqeline Broers is a 75 year old female with history of heart failure status post ICD, atrial fib on blood thinner who presents to the ED with ICD problem.  Patient with overall normal vitals.  She denies any chest pain or shortness of breath.  She was instructed to come here by her cardiologist who is in [place] as [employer] called about her ICD having an issue.  She currently moved here.  She is due to see cardiology next week here to establish care.  She is unsure of the ICD issue.  She denies any chest pain, shortness of breath, leg swelling.  [employer] device was interrogated.  Lab work was obtained.  Troponin stable x2.  These were in the 60s.  BNP mildly elevated at 560.  Otherwise lab work is unremarkable.  Chest x-ray unremarkable.  No signs of large volume overload on exam.  Vital signs are reassuring.  I was able to get the [employer] report and overall the main issue was that the OptiFlow portion of the machine was saying that patient might be volume overloaded as this has been collecting data since February.  Talked on the phone with cardiology about this and overall not significant given that she is asymptomatic.  I decided to give her a dose of Lasix and she will follow-up with cardiology next week.  Discharged in good condition.  No concern for other arrhythmia or ICD problem.  This chart was dictated using voice recognition software.  Despite best efforts to proofread,  errors can occur which can change the documentation meaning.    Final Clinical Impression(s) / ED Diagnoses Final diagnoses:  Encounter for discussion regarding implantable cardioverter-defibrillator (ICD)     Rx / DC Orders ED Discharge Orders    None       Virgina Norfolk, DO 05/12/20 2129

## 2020-05-12 NOTE — ED Triage Notes (Signed)
Pt reports that ICD company called her to come to ED due to abnormality noted in her cardiac rhythm. Pt has no complaints and no acute distress is noted.

## 2020-05-12 NOTE — ED Triage Notes (Signed)
Emergency Medicine Provider Triage Evaluation Note  Lindsey Huber , a 75 y.o. female  was evaluated in triage.  Pt complains of irregular heart rate. States that she has a pacemaker and was contacted by her cardiologist and told that her rhythm was abnormal. She did not feel her icd fire. States that she has been asymptomatic.  Review of Systems  Positive: Abnormal heart rhythm Negative: Chest pain, sob  Physical Exam  BP (!) 152/75 (BP Location: Left Arm)   Pulse 74   Temp (!) 97.3 F (36.3 C) (Oral)   Resp 17   SpO2 100%  Gen:   Awake, no distress   HEENT:  Atraumatic  Resp:  Normal effort, bibasilar rales Cardiac:  Normal rate  Abd:   Nondistended, nontender  MSK:   Moves extremities without difficulty  Neuro:  Speech clear   Medical Decision Making  Medically screening exam initiated at 6:34 PM.  Appropriate orders placed.  Lindsey Huber was informed that the remainder of the evaluation will be completed by another provider, this initial triage assessment does not replace that evaluation, and the importance of remaining in the ED until their evaluation is complete.  Clinical Impression   76 y/o f here with arrhytmia  MSE was initiated and I personally evaluated the patient and placed orders (if any) at  6:34 PM on May 12, 2020.  The patient appears stable so that the remainder of the MSE may be completed by another provider.    Karrie Meres, [place] 05/12/20 1834

## 2020-05-20 ENCOUNTER — Other Ambulatory Visit: Payer: Self-pay

## 2020-05-20 ENCOUNTER — Ambulatory Visit (INDEPENDENT_AMBULATORY_CARE_PROVIDER_SITE_OTHER): Payer: Medicare HMO | Admitting: Cardiology

## 2020-05-20 ENCOUNTER — Encounter: Payer: Self-pay | Admitting: Cardiology

## 2020-05-20 VITALS — BP 178/80 | HR 90 | Ht 63.5 in | Wt 118.6 lb

## 2020-05-20 DIAGNOSIS — R030 Elevated blood-pressure reading, without diagnosis of hypertension: Secondary | ICD-10-CM

## 2020-05-20 DIAGNOSIS — I480 Paroxysmal atrial fibrillation: Secondary | ICD-10-CM

## 2020-05-20 DIAGNOSIS — I429 Cardiomyopathy, unspecified: Secondary | ICD-10-CM | POA: Diagnosis not present

## 2020-05-20 DIAGNOSIS — Z95810 Presence of automatic (implantable) cardiac defibrillator: Secondary | ICD-10-CM

## 2020-05-20 DIAGNOSIS — D638 Anemia in other chronic diseases classified elsewhere: Secondary | ICD-10-CM | POA: Diagnosis not present

## 2020-05-20 MED ORDER — RIVAROXABAN 15 MG PO TABS: 15.0000 mg | ORAL_TABLET | Freq: Every day | ORAL | 11 refills | Status: DC

## 2020-05-20 NOTE — Progress Notes (Signed)
Electrophysiology Office Note:    Date:  05/20/2020   ID:  Lindsey Huber, DOB 1945-04-26, MRN [id number]  PCP:  Lindsey Mares, PA  [facility] Cardiologist:  No primary care provider on file.  [facility] Electrophysiologist:  Lindsey Prude, MD   Referring MD: Lindsey Mares, PA   Chief Complaint: ICD  History of Present Illness:    Lindsey Huber is a 75 y.o. female who presents for an evaluation of ICD at the request of [place], PA-C.  She has an extensive past medical history that includes cardiomyopathy, GERD, hypertension, idiopathic pulmonary fibrosis, paroxysmal atrial fibrillation and flutter post ablation, bioprosthetic aortic valve replacement, tricuspid valve replacement stage IIIa chronic kidney disease, SLE and anemia of chronic disease.  She presents today to establish care for her ICD.  She was previously seen in [facility] by cardiology.  She takes Eliquis for stroke prophylaxis.  She is with her daughter today in clinic.  She tells me that overall she is doing well since being discharged from her hospitalization for sepsis in February.  She has some of that she has intermittent nosebleeds on her Eliquis.  They last about 5 minutes but she is interested in trying another blood thinner to see if it helps.  She has never been on Xarelto.  Past Medical History:  Diagnosis Date  . A-fib (HCC)   . Anemia   . Cardiomyopathy (HCC)   . CHF (congestive heart failure) (HCC)   . CKD (chronic kidney disease)   . Diverticulosis   . Hypertension   . Lupus (HCC)   . Osteoarthritis   . Pulmonary fibrosis (HCC)   . Vitamin D deficiency     Past Surgical History:  Procedure Laterality Date  . PACEMAKER INSERTION      Current Medications: Current Meds  Medication Sig  . acetaminophen (TYLENOL) 650 MG CR tablet Take 650 mg by mouth every 8 (eight) hours as needed for pain.  Marland Kitchen albuterol (VENTOLIN HFA) 108 (90 Base) MCG/ACT inhaler  Inhale 2 puffs into the lungs every 6 (six) hours as needed for wheezing or shortness of breath.  . allopurinol (ZYLOPRIM) 100 MG tablet Take 100 mg by mouth daily.  . calcium carbonate (OSCAL) 1500 (600 Ca) MG TABS tablet Take by mouth 2 (two) times daily with a meal.  . cetirizine HCl (ZYRTEC) 5 MG/5ML SOLN Take 5 mg by mouth daily.  . Cholecalciferol 25 MCG (1000 UT) tablet Take 1 tablet by mouth daily.  . feeding supplement (ENSURE IMMUNE HEALTH) LIQD Take 237 mLs by mouth 3 (three) times daily with meals.  . fluticasone (FLONASE) 50 MCG/ACT nasal spray Place 2 sprays into both nostrils daily.  . Fluticasone-Umeclidin-Vilant (TRELEGY ELLIPTA) 100-62.5-25 MCG/INH AEPB Inhale into the lungs.  . folic acid (FOLVITE) 800 MCG tablet Take 400 mcg by mouth daily.  . furosemide (LASIX) 20 MG tablet Take 20 mg by mouth.  . hydrocortisone (CORTEF) 5 MG tablet Take 5 mg by mouth 2 (two) times daily.  . hydroxychloroquine (PLAQUENIL) 200 MG tablet Take 200 mg by mouth daily.  Marland Kitchen levothyroxine (SYNTHROID) 75 MCG tablet Take 75 mcg by mouth daily before breakfast.  . metoprolol tartrate (LOPRESSOR) 25 MG tablet Take 25 mg by mouth daily.  . Multiple Vitamin (MULTIVITAMIN) LIQD Take 5 mLs by mouth daily.  . Nintedanib (OFEV) 150 MG CAPS Take 150 mg by mouth 2 (two) times daily.  . Rivaroxaban (XARELTO) 15 MG TABS tablet Take 1 tablet (15 mg total)  by mouth daily with supper.  . senna-docusate (SENOKOT-S) 8.6-50 MG tablet Take 2 tablets by mouth as needed for irritation.  . [DISCONTINUED] apixaban (ELIQUIS) 2.5 MG TABS tablet Take 2.5 mg by mouth 2 (two) times daily.  . [DISCONTINUED] ELIQUIS 5 MG TABS tablet Take 1 tablet by mouth 2 (two) times daily.     Allergies:   Norvasc [amlodipine]   Social History   Socioeconomic History  . Marital status: Legally Separated    Spouse name: Not on file  . Number of children: Not on file  . Years of education: Not on file  . Highest education level: Not on  file  Occupational History  . Not on file  Tobacco Use  . Smoking status: Never Smoker  . Smokeless tobacco: Never Used  Substance and Sexual Activity  . Alcohol use: Not on file  . Drug use: Not on file  . Sexual activity: Not on file  Other Topics Concern  . Not on file  Social History Narrative  . Not on file   Social Determinants of Health   Financial Resource Strain: Not on file  Food Insecurity: Not on file  Transportation Needs: Not on file  Physical Activity: Not on file  Stress: Not on file  Social Connections: Not on file     Family History: The patient's family history is not on file.  ROS:   Please see the history of present illness.    All other systems reviewed and are negative.  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    May 20, 2020 in clinic device interrogation personally reviewed 99% V pacing Lead parameters are stable Battery longevity 7.4 years Her OptiVol index has been elevated since her hospitalization for sepsis in February.  Recent Labs: 05/12/2020: B Natriuretic Peptide 567.7; BUN 17; Creatinine, Ser 0.96; Hemoglobin 8.8; Magnesium 1.8; Platelets 121; Potassium 3.4; Sodium 137  Recent Lipid Panel No results found for: CHOL, TRIG, HDL, CHOLHDL, VLDL, LDLCALC, LDLDIRECT  Physical Exam:    VS:  BP (!) 178/80   Pulse 90   Ht 5' 3.5" (1.613 m)   Wt 118 lb 9.6 oz (53.8 kg)   SpO2 95%   BMI 20.68 kg/m     Wt Readings from Last 3 Encounters:  05/20/20 118 lb 9.6 oz (53.8 kg)  05/12/20 118 lb (53.5 kg)  05/01/20 128 lb 6.4 oz (58.2 kg)     GEN: Well nourished, well developed in no acute distress.   Using Rollator for ambulation HEENT: Normal NECK: No JVD; No carotid bruits LYMPHATICS: No lymphadenopathy CARDIAC: RRR, no murmurs, rubs, gallops.  Defibrillator pocket well-healed without pain. RESPIRATORY:  Clear to auscultation without rales, wheezing or rhonchi  ABDOMEN: Soft, non-tender, non-distended MUSCULOSKELETAL:  No edema; No deformity  SKIN: Warm and dry NEUROLOGIC:  Alert and oriented x 3 PSYCHIATRIC:  Normal affect   ASSESSMENT:    1. Paroxysmal atrial fibrillation with RVR (HCC)   2. Implantable cardioverter-defibrillator (ICD) in situ    PLAN:    In order of problems listed above:  1. Paroxysmal atrial fibrillation On Eliquis for stroke prophylaxis.  We will transition her to Xarelto 15 mg by mouth once daily to see if this helps  with her nosebleeds.   2.  ICD in situ Will establish her in our device clinic for remote monitoring  3.  Elevated blood pressure reading Patient's daughter brings in a log of her daily blood pressures at home and they are all controlled in the 120s 130s systolic.  I suspect there is an element of whitecoat hypertension contributing to today's reading.  I have asked him to continue taking measurements at home.  Follow-up 1 year.   Medication Adjustments/Labs and Tests Ordered: Current medicines are reviewed at length with the patient today.  Concerns regarding medicines are outlined above.  Orders Placed This Encounter  Procedures  . EKG 12-Lead   Meds ordered this encounter  Medications  . Rivaroxaban (XARELTO) 15 MG TABS tablet    Sig: Take 1 tablet (15 mg total) by mouth daily with supper.    Dispense:  30 tablet    Refill:  11     Signed, Deisha Stull T. Lalla Brothers, MD, [facility], [facility] 05/20/2020 8:55 AM    Electrophysiology [facility]

## 2020-05-20 NOTE — Patient Instructions (Addendum)
Medication Instructions:  Your physician has recommended you make the following change in your medication:   1.  STOP ELIQUIS  2.  START Xarelto 15 mg- Take one tablet by mouth daily with dinner.  *If you need a refill on your cardiac medications before your next appointment, please call your pharmacy*  Lab Work: None ordered. If you have labs (blood work) drawn today and your tests are completely normal, you will receive your results only by: Marland Kitchen MyChart Message (if you have MyChart) OR . A paper copy in the mail If you have any lab test that is abnormal or we need to change your treatment, we will call you to review the results.  Testing/Procedures: None ordered.  Follow-Up: At [facility], you and your health needs are our priority.  As part of our continuing mission to provide you with exceptional heart care, we have created designated Provider Care Teams.  These Care Teams include your primary Cardiologist (physician) and Advanced Practice Providers (APPs -  Physician Assistants and Nurse Practitioners) who all work together to provide you with the care you need, when you need it.  Your next appointment:   Your physician wants you to follow-up in: one year with Dr. Lalla Brothers.   You will receive a reminder letter in the mail two months in advance. If you don't receive a letter, please call our office to schedule the follow-up appointment.  Remote monitoring is used to monitor your ICD from home.  We will set you up with remote monitoring with our device clinic.  Device clinic [phone]

## 2020-05-23 ENCOUNTER — Telehealth: Payer: Self-pay | Admitting: Cardiology

## 2020-05-23 ENCOUNTER — Telehealth: Payer: Self-pay | Admitting: Pulmonary Disease

## 2020-05-23 NOTE — Telephone Encounter (Signed)
Spoke with pt and and advised of Dr Harvie Bridge recommendation to continue Xarelto for now.  Pt states she has called pulmonology and is waiting on a call back.  Pt verbalizes understanding is she has another episode of hemoptysis she should report to ED.  Pt thanked Charity fundraiser for the call.

## 2020-05-23 NOTE — Telephone Encounter (Signed)
Spoke with pt, states that her cardiologist has switched her from Eliquis to Xarelto. She took last dose of Eliquis yesterday morning approx 0600, and took her first dose of Xarelto yesterday evening.  Pt woke up approx 0200 this morning with some sob, and had a prod cough with dark red blood and contained blood clots. Pt states that since this her sputum has lightened to pink tinged.  Denies any current SOB, fever, chest pain.   Pt reached out to cardiologist, who advised her to contact our office for further recs and to present to ED if hemoptysis worsens.   [name] please advise on recs, thanks!

## 2020-05-23 NOTE — Telephone Encounter (Signed)
Mild hemoptysis that seems improving, the pink tinge is encouraging compared to the clots overnight. Advise her to go to ED if coughs up more than a couple tablespoons of frank fresh blood at a time. It is not uncommon to cough up a little blood intermittently with pulmonary fibrosis and while on a blood thinner is often a little worse but usually gets better on its own.

## 2020-05-23 NOTE — Telephone Encounter (Signed)
She has been on Eliquis long term.  She was just changed to Xarelto and took her first dose last night.  She had some scant hemoptysis. At this point I do not necessarily think that her hemoptysis is due to the fact that she changed from Eliquis to Xarelto although that is possible. I suspect this is more of a pulmonary fibrosis issue and she should call her pulmonary doctor. Continue Xarelto for now.  If she has recurrent hemoptysis then she should stop the Xarelto and change back to Eliquis.  If she has severe hemoptysis she needs to go to the emergency room.

## 2020-05-23 NOTE — Telephone Encounter (Signed)
Spoke with pt who reports between 2am-3am this morning she woke feeling some mild SOB and began to cough up phlegm that contained dark blood and a couple of clots. Pt continued to cough and blood became lighter and pink tinged in color.  Pt has not seen any blood in her sputum since 5am.  She denies current CP, SOB, dizziness, fever, nausea or vomiting and states she feels fine right now.  She states she saw Dr Lalla Brothers on 05/20/2020 to establish care for ICD and Afib management.  Her Eliquis was changed to Xarelto d/t her history of diarrhea.  Pt states she took her first Xarelto last night.  She is concerned the medication change possibly caused this.  Pt also has a history of Pulmonary Fibrosis and systemic Lupus.   Pt advised Dr Lalla Brothers is out of the office today but will forward information to him and our DOD, Dr Elease Hashimoto.  Encouraged pt to contact her Pulmonologist now and if symptoms reappear she is to go immediately to the ED for further evaluation.  Pt verbalizes understanding and agrees with current plan.

## 2020-05-23 NOTE — Telephone Encounter (Signed)
Spoke with pt, aware of recs.  Nothing further needed at this time- will close encounter.   

## 2020-05-23 NOTE — Telephone Encounter (Signed)
New message   Pt c/o medication issue:  1. Name of Medication: xarelto  2. How are you currently taking this medication (dosage and times per day)? Once daily at supper   3. Are you having a reaction (difficulty breathing--STAT)? no  4. What is your medication issue? Pt states she took medication last night and woke up this morning about 3 coughing and spitting up blood

## 2020-06-19 ENCOUNTER — Other Ambulatory Visit: Payer: Self-pay | Admitting: Gastroenterology

## 2020-06-19 DIAGNOSIS — R634 Abnormal weight loss: Secondary | ICD-10-CM

## 2020-06-19 DIAGNOSIS — R194 Change in bowel habit: Secondary | ICD-10-CM

## 2020-06-19 DIAGNOSIS — R1319 Other dysphagia: Secondary | ICD-10-CM

## 2020-06-26 NOTE — Progress Notes (Signed)
Office Visit Note  Patient: Lindsey Huber             Date of Birth: 01-Nov-1945           MRN: [id number]             PCP: Collene Mares, PA Referring: Collene Mares, [name] Visit Date: 06/27/2020 Occupation: Former   Subjective:  New Patient (Initial Visit) (Total body pain, bil knee pain, bil hand pain)   History of Present Illness: Lindsey Huber is a 75 y.o. female here to establish care for systemic lupus and gouty arthritis currently on hydroxychloroquine 200 mg p.o. daily and allopurinol 100 mg p.o. daily.  She was originally diagnosed due to symptoms of multiple joint pains with orthopedic surgery evaluation in [place] with additional work-up revealing for systemic lupus.  She has previously taken steroids and was on low-dose prednisone 5 mg daily for quite some time although discontinued at her most recent hospitalization.  She has had episodic gout flares involving both feet.  Lupus symptoms have been predominantly arthritis possibly a overlap with rheumatoid arthritis with no known history of lupus nephritis.  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  She has also had intra-articular steroid injection of the knees for arthritis symptoms with reasonably good benefit.  Her left knee has had some frequent slipping or instability reported she describes a fall last week despite routine use of a walker and sometimes knee brace for this.  Activities of Daily Living:  Patient reports morning stiffness for 24 hours.   Patient Denies nocturnal pain.  Difficulty dressing/grooming: Reports Difficulty climbing stairs: Reports Difficulty getting out of chair: Reports Difficulty using hands for taps, buttons, cutlery, and/or writing: Reports  Review of Systems  Constitutional: Positive for fatigue.  HENT: Positive for mouth dryness.   Eyes: Positive for dryness.  Respiratory: Positive for shortness of breath.   Cardiovascular:  Positive for swelling in legs/feet.  Gastrointestinal: Positive for constipation and diarrhea.  Endocrine: Positive for cold intolerance and increased urination.  Genitourinary: Negative for difficulty urinating.  Musculoskeletal: Positive for arthralgias, gait problem, joint pain, joint swelling, muscle weakness, morning stiffness and muscle tenderness.  Skin: Negative for rash.  Allergic/Immunologic: Negative for susceptible to infections.  Neurological: Positive for numbness and weakness.  Hematological: Positive for bruising/bleeding tendency.  Psychiatric/Behavioral: Positive for sleep disturbance.    PMFS History:  Patient Active Problem List   Diagnosis Date Noted  . Idiopathic gout of multiple sites 06/27/2020  . 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  . Vitamin D deficiency 05/01/2020  . Mammogram abnormal 05/01/2020  . Proteinuria 05/01/2020  . Other bilateral secondary osteoarthritis of knee 05/01/2020  . Osteoarthritis of hip 05/01/2020  . HTN (hypertension) 05/01/2020  . GERD without esophagitis 05/01/2020  . 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HCC) 05/01/2020  . Cardiomyopathy (HCC) 05/01/2020  . Iatrogenic hypotension 05/01/2020  . Hypercoagulability due to atrial fibrillation (HCC) 05/01/2020  . Systemic lupus erythematosus (HCC) 05/01/2020  . Chronic heart failure with preserved ejection fraction (HCC) 05/01/2020  . 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  . Paroxysmal atrial fibrillation with RVR (HCC) 05/01/2020  . Protein calorie malnutrition (HCC) 05/01/2020  . Chronic kidney disease (CKD), active medical management without dialysis, stage 3 (moderate) (HCC) 05/01/2020  . 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HCC) 05/01/2020  . Non-rheumatic atrial fibrillation (HCC) 05/01/2020  . Atherosclerosis of abdominal aorta (HCC) 05/01/2020  . Diverticulosis of colon 05/01/2020  . Cholelithiasis  05/01/2020  . Secondary adrenal  insufficiency (HCC) 05/01/2020  . Anemia of chronic disease 05/01/2020    Past Medical History:  Diagnosis Date  . A-fib (HCC)   . Anemia   . Cardiomyopathy (HCC)   . CHF (congestive heart failure) (HCC)   . CKD (chronic kidney disease)   . Diverticulosis   . Hypertension   . Lupus (HCC)   . Osteoarthritis   . Pulmonary fibrosis (HCC)   . Vitamin D deficiency     Family History  Problem Relation Age of Onset  . Heart disease Mother   . Hypertension Mother   . Rheum arthritis Mother   . Osteoarthritis Mother   . Heart disease Father   . Hypertension Father   . Osteoarthritis Father   . Heart disease Sister   . Diabetes Brother   . Cancer Brother    Past Surgical History:  Procedure Laterality Date  . CARDIAC VALVE SURGERY    . CARPAL TUNNEL RELEASE    . HEMORRHOID SURGERY    . PACEMAKER INSERTION    . TONSILLECTOMY     Social History   Social History Narrative  . Not on file   Immunization History  Administered Date(s) Administered  . PFIZER(Purple Top)SARS-COV-2 Vaccination 04/17/2019, 11/24/2019     Objective: Vital Signs: BP (!) 148/73 (BP Location: Right Arm, Patient Position: Sitting, Cuff Size: Normal)   Pulse 69   Resp 16   Ht 5' 2.5" (1.588 m)   Wt 118 lb (53.5 kg)   BMI 21.24 kg/m    Physical Exam HENT:     Right Ear: External ear normal.     Left Ear: External ear normal.     Mouth/Throat:     Mouth: Mucous membranes are moist.     Pharynx: Oropharynx is clear.  Eyes:     Conjunctiva/sclera: Conjunctivae normal.  Cardiovascular:     Rate and Rhythm: Normal rate and regular rhythm.     Heart sounds: Murmur heard.    Pulmonary:     Comments: Bilateral inspiratory crackles Skin:    General: Skin is warm and dry.     Findings: No rash.     Comments: Bilateral pitting edema at the ankles, wearing high socks  Neurological:     Mental Status: She is alert.     Comments: Difficult to produce knee and ankle jerk reflexes but intact   Psychiatric:        Mood and Affect: Mood normal.     Musculoskeletal Exam: Right wrist in brace for previous surgical fixation Squaring of 1st CMC joint more on right, mild bony enlargement of several MCP joints Left knee patellofemoral crepitus, bony enlargement, bilateral knees medial joint line tenderness to palpation Negative MTP squeeze tenderness  Investigation: No additional findings.  Imaging: No results found.  Recent Labs: Lab Results  Component Value Date   WBC 6.4 05/12/2020   HGB 8.8 (L) 05/12/2020   PLT 121 (L) 05/12/2020   NA 137 05/12/2020   K 3.4 (L) 05/12/2020   CL 102 05/12/2020   CO2 27 05/12/2020   GLUCOSE 83 05/12/2020   BUN 17 05/12/2020   CREATININE 0.96 05/12/2020   CALCIUM 9.2 05/12/2020    Speciality Comments: No specialty comments available.  Procedures:  No procedures performed Allergies: Propofol, Amlodipine, Flecainide, and Amiodarone   Assessment / Plan:     Visit Diagnoses: Systemic lupus erythematosus, unspecified SLE type, unspecified organ involvement status (HCC) - Plan: Sedimentation rate, Anti-DNA antibody, double-stranded, C3  and C4, Protein / creatinine ratio, urine, Ambulatory referral to Ophthalmology, hydroxychloroquine (PLAQUENIL) 200 MG tablet  Longstanding history of systemic lupus since decades ago manifestations seem most consistent with inflammatory arthritis and possibly more than RA overlap.  Currently on long-term treatment with hydroxychloroquine symptoms seem reasonably controlled I suspect more of the knee problem is degenerative arthritis.  We will check for disease activity sedimentation rate, dsDNA, complements, urine protein creatinine ratio.  Continue hydroxychloroquine 200 mg p.o. daily, referral to ophthalmology for routine hydroxychloroquine retinal toxicity screening as she has a greater than 15-year medication use history.  Chronic kidney disease (CKD), active medical management without dialysis, stage 3  (moderate) (HCC)  CKD stage III with no known lupus nephritis monitoring for urine protein as above.  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HCC)  Currently on Ofev treatment she does describe shortness of breath with any increased activity level is following up with pulmonology for this.  Vitamin D deficiency - Plan: VITAMIN D 25 Hydroxy (Vit-D Deficiency, Fractures)  Has had previous vitamin D deficiency we will check for adequacy of replacement today for association with lupus.  Idiopathic chronic gout of multiple sites without tophus - Plan: Uric acid, allopurinol (ZYLOPRIM) 100 MG tablet  Previous history of gout affecting both feet no current inflammation somewhat hard to distinguish as she has chronic edema causing some swelling.  She has had some weight loss and also there is some changes in renal function to make sure current uric acid is at goal.  Orders: Orders Placed This Encounter  Procedures  . Sedimentation rate  . Uric acid  . Anti-DNA antibody, double-stranded  . C3 and C4  . Protein / creatinine ratio, urine  . VITAMIN D 25 Hydroxy (Vit-D Deficiency, Fractures)  . Ambulatory referral to Ophthalmology   Meds ordered this encounter  Medications  . hydroxychloroquine (PLAQUENIL) 200 MG tablet    Sig: Take 1 tablet (200 mg total) by mouth daily.    Dispense:  30 tablet    Refill:  0  . allopurinol (ZYLOPRIM) 100 MG tablet    Sig: Take 1 tablet (100 mg total) by mouth daily.    Dispense:  30 tablet    Refill:  0     Follow-Up Instructions: Return in about 8 weeks (around 08/22/2020) for SLE, gout, new pt f/u.   Fuller Plan, MD  Note - This record has been created using [employer].  Chart creation errors have been sought, but may not always  have been located. Such creation errors do not reflect on  the standard of medical care.

## 2020-06-27 ENCOUNTER — Encounter: Payer: Self-pay | Admitting: Internal Medicine

## 2020-06-27 ENCOUNTER — Ambulatory Visit (INDEPENDENT_AMBULATORY_CARE_PROVIDER_SITE_OTHER): Payer: Medicare HMO | Admitting: Internal Medicine

## 2020-06-27 ENCOUNTER — Other Ambulatory Visit: Payer: Self-pay

## 2020-06-27 VITALS — BP 148/73 | HR 69 | Resp 16 | Ht 62.5 in | Wt 118.0 lb

## 2020-06-27 DIAGNOSIS — E559 Vitamin D deficiency, unspecified: Secondary | ICD-10-CM | POA: Diagnosis not present

## 2020-06-27 DIAGNOSIS — N183 Chronic kidney disease, stage 3 unspecified: Secondary | ICD-10-CM | POA: Diagnosis not present

## 2020-06-27 DIAGNOSIS — M329 Systemic lupus erythematosus, unspecified: Secondary | ICD-10-CM

## 2020-06-27 DIAGNOSIS — M1A09X0 Idiopathic chronic gout, multiple sites, without tophus (tophi): Secondary | ICD-10-CM

## 2020-06-27 DIAGNOSIS — J84112 Idiopathic pulmonary fibrosis: Secondary | ICD-10-CM

## 2020-06-27 DIAGNOSIS — M1009 Idiopathic gout, multiple sites: Secondary | ICD-10-CM | POA: Insufficient documentation

## 2020-06-27 MED ORDER — HYDROXYCHLOROQUINE SULFATE 200 MG PO TABS: 200.0000 mg | ORAL_TABLET | Freq: Every day | ORAL | 0 refills | Status: DC

## 2020-06-27 MED ORDER — ALLOPURINOL 100 MG PO TABS: 100.0000 mg | ORAL_TABLET | Freq: Every day | ORAL | 0 refills | Status: DC

## 2020-06-27 NOTE — Patient Instructions (Addendum)
Erythrocyte Sedimentation Rate Test Why am I having this test? The erythrocyte sedimentation rate (ESR) test is used to help find illnesses related to:  Sudden (acute) or long-term (chronic) infections.  Inflammation.  The body's disease-fighting system attacking healthy cells (autoimmune diseases).  Cancer.  Tissue death. If you have symptoms that may be related to any of these illnesses, your health care provider may do an ESR test before doing more specific tests. If you have an inflammatory immune disease, such as rheumatoid arthritis, you may have this test to help monitor your therapy. What is being tested? This test measures how long it takes for your red blood cells (erythrocytes) to settle in a solution over a certain amount of time (sedimentation rate). When you have an infection or inflammation, your red blood cells clump together and settle faster. The sedimentation rate provides information about how much inflammation is present in the body. What kind of sample is taken? A blood sample is required for this test. It is usually collected by inserting a needle into a blood vessel.   How do I prepare for this test? Follow any instructions from your health care provider about changing or stopping your regular medicines. Tell a health care provider about:  Any allergies you have.  All medicines you are taking, including vitamins, herbs, eye drops, creams, and over-the-counter medicines.  Any blood disorders you have.  Any surgeries you have had.  Any medical conditions you have, such as thyroid or kidney disease.  Whether you are pregnant or may be pregnant. How are the results reported? Your results will be reported as a value that measures sedimentation rate in millimeters per hour (mm/hr). Your health care provider will compare your results to normal ranges that were established after testing a large group of people (reference values). Reference values may vary among  labs and hospitals. For this test, common reference values, which vary by age and gender, are:  Newborn: 0-2 mm/hr.  Child, up to puberty: 0-10 mm/hr.  Female: ? Under 50 years: 0-20 mm/hr. ? 50-85 years: 0-30 mm/hr. ? Over 85 years: 0-42 mm/hr.  Female: ? Under 50 years: 0-15 mm/hr. ? 50-85 years: 0-20 mm/hr. ? Over 85 years: 0-30 mm/hr. Certain conditions or medicines may cause ESR levels to be falsely lower or higher, such as:  Pregnancy.  Obesity.  Steroids, birth control pills, and blood thinners.  Thyroid or kidney disease. What do the results mean? Results that are within reference values are considered normal, meaning that the level of inflammation in your body is healthy. High ESR levels mean that there is inflammation in your body. You will have more tests to help make a diagnosis. Inflammation may result from many different conditions or injuries. Talk with your health care provider about what your results mean. Questions to ask your health care provider Ask your health care provider, or the department that is doing the test:  When will my results be ready?  How will I get my results?  What are my treatment options?  What other tests do I need?  What are my next steps? Summary  The erythrocyte sedimentation rate (ESR) test is used to help find illnesses associated with sudden (acute) or long-term (chronic) infections, inflammation, autoimmune diseases, cancer, or tissue death.  If you have symptoms that may be related to any of these illnesses, your health care provider may do an ESR test before doing more specific tests. If you have an inflammatory immune disease, such as rheumatoid  arthritis, you may have this test to help monitor your therapy.  This test measures how long it takes for your red blood cells (erythrocytes) to settle in a solution over a certain amount of time (sedimentation rate). This provides information about how much inflammation is present  in the body. This information is not intended to replace advice given to you by your health care provider. Make sure you discuss any questions you have with your health care provider. Document Revised: 09/14/2019 Document Reviewed: 09/14/2019 Elsevier Patient Education  [place].   Anti-DNA Antibody Test Why am I having this test? The anti-DNA antibody test helps with the diagnosis and follow-up of systemic lupus erythematosus (SLE). It is also used to monitor treatment of this condition as the antibody decreases with successful therapy. What is being tested? This test measures the amount of anti-DNA antibody in the blood. This antibody is found in 65-80% of patients with active SLE. This antibody is not as common in patients who have other diseases. What kind of sample is taken? A blood sample is required for this test. It is usually collected by inserting a needle into a blood vessel.   How are the results reported? Your test results will be reported as a value. Your test results may also be reported as positive, intermediate, or negative. Your health care provider will compare your results to normal ranges that were established after testing a large group of people (reference values). Reference values may vary among labs and hospitals. For this test, common reference values are:  Positive: 10 or more international units/mL.  Intermediate: 5-9 international units/mL.  Negative: Less than 5 international units/mL. What do the results mean? Positive results, which are associated with results that are higher than the reference values, may indicate:  Autoimmune disorders such as SLE.  Infectious mononucleosis.  Chronic liver conditions. Intermediate results mean that the anti-DNA antibody levels are higher than normal, but not high enough to be considered positive. Negative results mean that you do not have the anti-DNA antibody that is associated with these conditions. Talk  with your health care provider about what your results mean. Questions to ask your health care provider Ask your health care provider, or the department that is doing the test:  When will my results be ready?  How will I get my results?  What are my treatment options?  What other tests do I need?  What are my next steps? Summary  The anti-DNA antibody test helps with the diagnosis and follow-up of systemic lupus erythematosus (SLE). It is also used to monitor treatment of this condition as the antibody decreases with successful therapy.  This test measures the amount of anti-DNA antibody in the blood.  Elevated levels of anti-DNA antibody can be seen in patients with SLE and certain other conditions.   Complement Assay Test Why am I having this test? Complement refers to a group of proteins that are part of the body's disease-fighting system (immune system). A complement assay test provides information about some or all of these proteins. You may have this test:  To diagnose a lack, or deficiency, of certain complement proteins. Deficiencies can be passed from parent to child (inherited).  To monitor an infection or autoimmune disease.  If you have unexplained inflammation or swelling (edema).  If you have bacterial infections again and again. What is being tested? This test can be used to measure:  Total complement. This is the total number of protein complements in your  blood.  The number of each kind of complement in your blood. The nine main kinds of complement are labeled C1 through C9. Some of these complements, such as C3 and C4, are especially important and have many functions in the body. Depending on why you are having the test, your health care provider may test your total complement or only some individual complements, such as C3 and C4. The total complement assay test may be done before individual complements are tested. What kind of sample is taken? A blood  sample is required for this test. It is usually collected by inserting a needle into a blood vessel.   Tell a health care provider about:  Any allergies you have.  All medicines you are taking, including vitamins, herbs, eye drops, creams, and over-the-counter medicines.  Any blood disorders you have.  Any surgeries you have had.  Any medical conditions you have.  Whether you are pregnant or may be pregnant. How are the results reported? Your results will be reported as a value that tells you how much complement is in your blood. This will be given as units per milliliter of blood (units/mL) or as milligrams per deciliter of blood (mg/dL). Your results may be reported as total complement, or as individual complements, or both. Your health care provider will compare your results to normal ranges that were established after testing a large group of people (reference ranges). Reference ranges may vary among labs and hospitals. For this test, reference ranges for some of the most commonly measured complement assays may be:  Total complement: 30-75 units/mL.  C2: 1-4 mg/dL.  C3: 75-175 mg/dL.  C4: 22-45 units/mL. What do the results mean? Results within reference ranges are considered normal, which means you have a normal amount of complement in your blood. Results that are higher than the reference ranges may be caused by:  Inflammatory disease.  Heart attack.  Cancer. Complement deficiencies, or results lower than the reference ranges, may be caused by:  Certain inherited conditions.  Autoimmune disease.  Certain liver diseases.  Malnutrition.  Certain types of anemia that result in breakdown of red blood cells (hemolytic anemia). Talk with your health care provider about what your results mean. Questions to ask your health care provider Ask your health care provider, or the department that is doing the test:  When will my results be ready?  How will I get my  results?  What are my treatment options?  What other tests do I need?  What are my next steps? Summary  Complement refers to a group of proteins that are part of the body's disease-fighting system (immune system). A complement assay test can provide information about some or all of these proteins.  You may have a complement assay test to help diagnose a complement deficiency, and to monitor some infections or autoimmune disease.  Talk with your health care provider about what your results mean.   Urine Protein Test Why am I having this test? A urine protein test may be used to check the health of your kidneys and to look for kidney damage or kidney disease. The test may be done as part of a normal screening or if you have a condition that affects your kidneys, such as:  Diabetes.  High blood pressure (hypertension).  Urinary tract infection.  Problems resulting from pregnancy, including preeclampsia. Your health care provider may also do this test if you have symptoms of kidney disease. These can include:  Fluid retention, also called  edema.  Tiredness, or fatigue.  Nausea. What is being tested? This test measures the amount of protein in your urine. Your blood contains proteins. When your blood is filtered by your kidneys, most of the proteins should be returned to your blood and there should be no protein or very little protein in your urine. If you have a large amount of protein in your urine (proteinuria), your kidneys may not be working as well as they should. What kind of sample is taken? This test requires a urine sample. Two methods may be used for the test:  A dipstick test is often the first step in a urine protein test. After you urinate into a germ-free (sterile) cup, your health care provider will dip a test strip into your sample. The health care provider can tell right away if there is protein in your urine.  Your health care provider may do a 24-hour urine  test if the dipstick test shows that there is a large amount of protein in your urine. This test measures the amount of protein that is released in your urine over a 24-hour period. You may also have other tests to find out the types of proteins you have in your urine.   How do I collect samples at home? If your health care provider wants to do a 24-hour urine test, you may be asked to collect urine samples at home over a 24-hour period. Follow instructions from a health care provider about how to collect the samples. When collecting a urine sample at home, make sure you:  Use supplies and instructions that you received from the lab.  Collect urine only in the sterile cup that you received from the lab.  Do not let any toilet paper or stool (feces) get into the cup.  Refrigerate the sample or keep it on ice until you can return it to the lab.  Return the sample(s) to the lab as instructed.   Tell a health care provider about:  All medicines you are taking, including vitamins, herbs, eye drops, creams, and over-the-counter medicines.  Any medical conditions you have. Some conditions can cause you to have protein in your urine.  Whether you have had a radiology scan with contrast dye.  Whether you have exercised heavily in the last few days.  Whether you are pregnant or may be pregnant. How are the results reported? The result of the urine dipstick test for proteins will be reported as either positive or negative.  A negative result means no protein or a small amount of protein was found in your urine.  A positive result means a large amount of protein was found in your urine. The result of the 24-hour urine test will be reported as a value that indicates the amount of protein in your urine. Your health care provider will compare your results to normal ranges that were established after testing a large group of people (reference ranges). Reference ranges may vary among labs and  hospitals. For someone at rest, 50-80 mg per 24 hours is considered normal. For someone exercising, 50-250 mg per 24 hours is considered normal. What do the results mean? Many conditions can cause a level of protein in your urine that is higher than normal, such as:  Dehydration.  Doing exercise that takes a lot of effort (is vigorous).  Urinary tract infection.  High blood pressure, heart failure, or diabetes.  Kidney disease.  Bladder cancer.  Preeclampsia, in pregnant women. Talk with your health  care provider about what your results mean. Questions to ask your health care provider Ask your health care provider, or the department that is doing the test:  When will my results be ready?  How will I get my results?  What are my treatment options?  What other tests do I need?  What are my next steps? Summary  The urine protein test is often used to screen for kidney disease.  This test measures the amount of protein in your urine. If you have a large amount of protein in your urine (proteinuria), your kidneys may not be working as well as they should.  Your health care provider may do a 24-hour urine test if the dipstick test shows that there is a large amount of protein in your urine.  Talk with your health care provider about what your results mean. This information is not intended to replace advice given to you by your health care provider. Make sure you discuss any questions you have with your health care provider. Document Revised: 09/14/2019 Document Reviewed: 04/06/2019 Elsevier Patient Education  [place].

## 2020-06-30 ENCOUNTER — Ambulatory Visit
Admission: RE | Admit: 2020-06-30 | Discharge: 2020-06-30 | Disposition: A | Discharge status: Home / Self Care | Payer: Medicare HMO | Source: Ambulatory Visit | Attending: Gastroenterology | Admitting: Gastroenterology

## 2020-06-30 DIAGNOSIS — R194 Change in bowel habit: Secondary | ICD-10-CM

## 2020-06-30 DIAGNOSIS — R634 Abnormal weight loss: Secondary | ICD-10-CM

## 2020-06-30 LAB — ANTI-DNA ANTIBODY, DOUBLE-STRANDED: ds DNA Ab: 3 IU/mL

## 2020-06-30 LAB — C3 AND C4
C3 Complement: 109 mg/dL (ref 83–193)
C4 Complement: 28 mg/dL (ref 15–57)

## 2020-06-30 LAB — SEDIMENTATION RATE: Sed Rate: 58 mm/h — ABNORMAL HIGH (ref 0–30)

## 2020-06-30 LAB — PROTEIN / CREATININE RATIO, URINE
Creatinine, Urine: 127 mg/dL (ref 20–275)
Protein/Creat Ratio: 409 mg/g creat — ABNORMAL HIGH (ref 21–161)
Protein/Creatinine Ratio: 0.409 mg/mg creat — ABNORMAL HIGH (ref 0.021–0.161)
Total Protein, Urine: 52 mg/dL — ABNORMAL HIGH (ref 5–24)

## 2020-06-30 LAB — URIC ACID: Uric Acid, Serum: 5.8 mg/dL (ref 2.5–7.0)

## 2020-06-30 LAB — VITAMIN D 25 HYDROXY (VIT D DEFICIENCY, FRACTURES): Vit D, 25-Hydroxy: 72 ng/mL (ref 30–100)

## 2020-06-30 MED ORDER — IOPAMIDOL (ISOVUE-300) INJECTION 61%
100.0000 mL | Freq: Once | INTRAVENOUS | Status: AC | PRN
  Administered 2020-06-30: 100 mL via INTRAVENOUS

## 2020-07-01 ENCOUNTER — Emergency Department (HOSPITAL_BASED_OUTPATIENT_CLINIC_OR_DEPARTMENT_OTHER): Payer: Medicare HMO

## 2020-07-01 ENCOUNTER — Emergency Department (HOSPITAL_BASED_OUTPATIENT_CLINIC_OR_DEPARTMENT_OTHER)
Admission: EM | Admit: 2020-07-01 | Discharge: 2020-07-02 | Disposition: A | Discharge status: Home / Self Care | Payer: Medicare HMO | Attending: Emergency Medicine | Admitting: Emergency Medicine

## 2020-07-01 ENCOUNTER — Encounter (HOSPITAL_BASED_OUTPATIENT_CLINIC_OR_DEPARTMENT_OTHER): Payer: Self-pay | Admitting: Emergency Medicine

## 2020-07-01 ENCOUNTER — Other Ambulatory Visit: Payer: Self-pay

## 2020-07-01 DIAGNOSIS — I130 Hypertensive heart and chronic kidney disease with heart failure and stage 1 through stage 4 chronic kidney disease, or unspecified chronic kidney disease: Secondary | ICD-10-CM | POA: Diagnosis not present

## 2020-07-01 DIAGNOSIS — G459 Transient cerebral ischemic attack, unspecified: Secondary | ICD-10-CM | POA: Diagnosis not present

## 2020-07-01 DIAGNOSIS — I509 Heart failure, unspecified: Secondary | ICD-10-CM | POA: Diagnosis not present

## 2020-07-01 DIAGNOSIS — Z950 Presence of cardiac pacemaker: Secondary | ICD-10-CM | POA: Insufficient documentation

## 2020-07-01 DIAGNOSIS — Z79899 Other long term (current) drug therapy: Secondary | ICD-10-CM | POA: Diagnosis not present

## 2020-07-01 DIAGNOSIS — N183 Chronic kidney disease, stage 3 unspecified: Secondary | ICD-10-CM | POA: Diagnosis not present

## 2020-07-01 DIAGNOSIS — R202 Paresthesia of skin: Secondary | ICD-10-CM | POA: Insufficient documentation

## 2020-07-01 DIAGNOSIS — I480 Paroxysmal atrial fibrillation: Secondary | ICD-10-CM | POA: Diagnosis not present

## 2020-07-01 DIAGNOSIS — Y900 Blood alcohol level of less than 20 mg/100 ml: Secondary | ICD-10-CM | POA: Diagnosis not present

## 2020-07-01 DIAGNOSIS — R4182 Altered mental status, unspecified: Secondary | ICD-10-CM | POA: Diagnosis present

## 2020-07-01 DIAGNOSIS — E049 Nontoxic goiter, unspecified: Secondary | ICD-10-CM | POA: Insufficient documentation

## 2020-07-01 DIAGNOSIS — Z7901 Long term (current) use of anticoagulants: Secondary | ICD-10-CM | POA: Diagnosis not present

## 2020-07-01 DIAGNOSIS — R519 Headache, unspecified: Secondary | ICD-10-CM | POA: Insufficient documentation

## 2020-07-01 LAB — CBC
HCT: 27.3 % — ABNORMAL LOW (ref 36.0–46.0)
Hemoglobin: 8.9 g/dL — ABNORMAL LOW (ref 12.0–15.0)
MCH: 31.2 pg (ref 26.0–34.0)
MCHC: 32.6 g/dL (ref 30.0–36.0)
MCV: 95.8 fL (ref 80.0–100.0)
Platelets: 128 10*3/uL — ABNORMAL LOW (ref 150–400)
RBC: 2.85 MIL/uL — ABNORMAL LOW (ref 3.87–5.11)
RDW: 15.8 % — ABNORMAL HIGH (ref 11.5–15.5)
WBC: 5.4 10*3/uL (ref 4.0–10.5)
nRBC: 0 % (ref 0.0–0.2)

## 2020-07-01 LAB — DIFFERENTIAL
Abs Immature Granulocytes: 0.01 10*3/uL (ref 0.00–0.07)
Basophils Absolute: 0 10*3/uL (ref 0.0–0.1)
Basophils Relative: 0 %
Eosinophils Absolute: 0.1 10*3/uL (ref 0.0–0.5)
Eosinophils Relative: 2 %
Immature Granulocytes: 0 %
Lymphocytes Relative: 24 %
Lymphs Abs: 1.3 10*3/uL (ref 0.7–4.0)
Monocytes Absolute: 0.6 10*3/uL (ref 0.1–1.0)
Monocytes Relative: 12 %
Neutro Abs: 3.3 10*3/uL (ref 1.7–7.7)
Neutrophils Relative %: 62 %

## 2020-07-01 LAB — APTT: aPTT: 36 seconds (ref 24–36)

## 2020-07-01 LAB — COMPREHENSIVE METABOLIC PANEL
ALT: 41 U/L (ref 0–44)
AST: 56 U/L — ABNORMAL HIGH (ref 15–41)
Albumin: 3.2 g/dL — ABNORMAL LOW (ref 3.5–5.0)
Alkaline Phosphatase: 69 U/L (ref 38–126)
Anion gap: 5 (ref 5–15)
BUN: 15 mg/dL (ref 8–23)
CO2: 27 mmol/L (ref 22–32)
Calcium: 8.9 mg/dL (ref 8.9–10.3)
Chloride: 104 mmol/L (ref 98–111)
Creatinine, Ser: 0.87 mg/dL (ref 0.44–1.00)
GFR, Estimated: >60 mL/min (ref >60)
Glucose, Bld: 102 mg/dL — ABNORMAL HIGH (ref 70–99)
Potassium: 4.1 mmol/L (ref 3.5–5.1)
Sodium: 136 mmol/L (ref 135–145)
Total Bilirubin: 0.7 mg/dL (ref 0.3–1.2)
Total Protein: 7.4 g/dL (ref 6.5–8.1)

## 2020-07-01 LAB — ETHANOL: Alcohol, Ethyl (B): <10 mg/dL (ref <10)

## 2020-07-01 LAB — CBG MONITORING, ED: Glucose-Capillary: 96 mg/dL (ref 70–99)

## 2020-07-01 LAB — PROTIME-INR
INR: 1.5 — ABNORMAL HIGH (ref 0.8–1.2)
Prothrombin Time: 18.5 seconds — ABNORMAL HIGH (ref 11.4–15.2)

## 2020-07-01 MED ORDER — IOHEXOL 350 MG/ML SOLN
75.0000 mL | Freq: Once | INTRAVENOUS | Status: AC | PRN
  Administered 2020-07-01: 75 mL via INTRAVENOUS

## 2020-07-01 NOTE — ED Notes (Signed)
Patient transported to CT 

## 2020-07-01 NOTE — ED Provider Notes (Signed)
[facility] EMERGENCY DEPARTMENT Provider Note   CSN: [id number] Arrival date & time: 07/01/20  1940     History Chief Complaint  Patient presents with  . stroke like symptoms    Lindsey Huber is a 75 y.o. female.  HPI 75 year old female presents with acute trouble speaking and AMS.  History is from patient somewhat and also family.  Family at the bedside states that around 6:28 PM she acutely started holding the right side of her head near her ear and was saying "mm-hmm".  She would not talk otherwise.  When she finally did speak it did not sound like she was talking correctly.  Words were slurred and did not usually make sense.  When she finally tried to identify the family member she used her own names of the family members name.  Called 911 and tried to get her to repeat phrases but she could not do it.  EMS was called but by that time she actually was completely resolved.  Lasted around 5 minutes in total.  They recommended she come to the ER to be checked out for a possible UTI.  Patient indicates she is been having a headache on and off all day.  It is mild, 3/10 now.  Intermittent foot and leg numbness bilaterally all day as well and some fingertip numbness.  Right now is completely back to normal besides a little bit of a headache and some tingling/numbness  Past Medical History:  Diagnosis Date  . A-fib (HCC)   . Anemia   . Cardiomyopathy (HCC)   . CHF (congestive heart failure) (HCC)   . CKD (chronic kidney disease)   . Diverticulosis   . Hypertension   . Lupus (HCC)   . Osteoarthritis   . Pulmonary fibrosis (HCC)   . Vitamin D deficiency     Patient Active Problem List   Diagnosis Date Noted  . Idiopathic gout of multiple sites 06/27/2020  . 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  . Vitamin D deficiency 05/01/2020  . Mammogram abnormal 05/01/2020  . Proteinuria 05/01/2020  . Other bilateral secondary osteoarthritis of knee 05/01/2020  . Osteoarthritis  of hip 05/01/2020  . HTN (hypertension) 05/01/2020  . GERD without esophagitis 05/01/2020  . 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HCC) 05/01/2020  . Cardiomyopathy (HCC) 05/01/2020  . Iatrogenic hypotension 05/01/2020  . Hypercoagulability due to atrial fibrillation (HCC) 05/01/2020  . Systemic lupus erythematosus (HCC) 05/01/2020  . Chronic heart failure with preserved ejection fraction (HCC) 05/01/2020  . 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  . Paroxysmal atrial fibrillation with RVR (HCC) 05/01/2020  . Protein calorie malnutrition (HCC) 05/01/2020  . Chronic kidney disease (CKD), active medical management without dialysis, stage 3 (moderate) (HCC) 05/01/2020  . 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HCC) 05/01/2020  . Non-rheumatic atrial fibrillation (HCC) 05/01/2020  . Atherosclerosis of abdominal aorta (HCC) 05/01/2020  . Diverticulosis of colon 05/01/2020  . Cholelithiasis 05/01/2020  . 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 Anemia of chronic disease 05/01/2020    Past Surgical History:  Procedure Laterality Date  . CARDIAC VALVE SURGERY    . CARPAL TUNNEL RELEASE    . HEMORRHOID SURGERY    . PACEMAKER INSERTION    . TONSILLECTOMY       OB History   No obstetric history on file.     Family History  Problem Relation Age of Onset  . Heart disease Mother   . Hypertension Mother   . Rheum arthritis Mother   .  Osteoarthritis Mother   . Heart disease Father   . Hypertension Father   . Osteoarthritis Father   . Heart disease Sister   . Diabetes Brother   . Cancer Brother     Social History   Tobacco Use  . Smoking status: Never Smoker  . Smokeless tobacco: Never Used  Vaping Use  . Vaping Use: Never used  Substance Use Topics  . Alcohol use: Never  . Drug use: Never    Home Medications Prior to Admission medications   Medication Sig Start Date End Date Taking? Authorizing Provider  acetaminophen  (TYLENOL) 650 MG CR tablet Take 650 mg by mouth every 8 (eight) hours as needed for pain.    [provider]  albuterol (VENTOLIN HFA) 108 (90 Base) MCG/ACT inhaler Inhale 2 puffs into the lungs every 6 (six) hours as needed for wheezing or shortness of breath.    [provider]  allopurinol (ZYLOPRIM) 100 MG tablet Take 1 tablet (100 mg total) by mouth daily. 06/27/20   Fuller Plan, MD  amoxicillin (AMOXIL) 500 MG capsule For dental procedures 03/30/20   [provider]  BREO ELLIPTA 100-25 MCG/INH AEPB 1 puff daily. 06/18/20   [provider]  calcium carbonate (OSCAL) 1500 (600 Ca) MG TABS tablet Take by mouth 2 (two) times daily with a meal. Patient not taking: Reported on 06/27/2020    [provider]  Calcium Carbonate-Vitamin D (CALTRATE 600+D PO)     [provider]  cetirizine HCl (ZYRTEC) 5 MG/5ML SOLN Take 5 mg by mouth daily.    [provider]  Cholecalciferol 25 MCG (1000 UT) tablet Take 1 tablet by mouth daily.    [provider]  feeding supplement (ENSURE IMMUNE HEALTH) LIQD Take 237 mLs by mouth 3 (three) times daily with meals.    [provider]  fluticasone (FLONASE) 50 MCG/ACT nasal spray Place 2 sprays into both nostrils daily.    [provider]  Fluticasone-Umeclidin-Vilant (TRELEGY ELLIPTA) 100-62.5-25 MCG/INH AEPB Inhale into the lungs.    [provider]  folic acid (FOLVITE) 800 MCG tablet Take 400 mcg by mouth daily.    [provider]  furosemide (LASIX) 20 MG tablet Take 20 mg by mouth.    [provider]  hydrocortisone (CORTEF) 5 MG tablet Take 5 mg by mouth 2 (two) times daily.    [provider]  hydroxychloroquine (PLAQUENIL) 200 MG tablet Take 1 tablet (200 mg total) by mouth daily. 06/27/20   Fuller Plan, MD  levothyroxine (SYNTHROID) 25 MCG tablet Take 25 mcg by mouth daily. Patient not taking: Reported on 06/27/2020 04/04/20    [provider]  levothyroxine (SYNTHROID) 75 MCG tablet Take 75 mcg by mouth daily before breakfast.    [provider]  metoprolol succinate (TOPROL-XL) 25 MG 24 hr tablet Take 1 tablet by mouth daily. 06/18/20   [provider]  metoprolol tartrate (LOPRESSOR) 25 MG tablet Take 25 mg by mouth daily. Patient not taking: Reported on 06/27/2020    [provider]  Multiple Vitamin (MULTIVITAMIN) LIQD Take 5 mLs by mouth daily.    [provider]  Nintedanib (OFEV) 150 MG CAPS Take 150 mg by mouth 2 (two) times daily.    [provider]  Rivaroxaban (XARELTO) 15 MG TABS tablet Take 1 tablet (15 mg total) by mouth daily with supper. 05/20/20   Lanier Prude, MD  senna-docusate (SENOKOT-S) 8.6-50 MG tablet Take 2 tablets by mouth  as needed for irritation. 02/24/20   [provider]    Allergies    Propofol, Amlodipine, Flecainide, and Amiodarone  Review of Systems   Review of Systems  Constitutional: Negative for fever.  Neurological: Positive for speech difficulty and headaches. Negative for weakness and numbness.  Psychiatric/Behavioral: Positive for confusion.  All other systems reviewed and are negative.   Physical Exam Updated Vital Signs BP 130/75 (BP Location: Left Arm)   Pulse 74   Temp 99.4 F (37.4 C)   Resp 20   Ht 5' 2.5" (1.588 m)   Wt 53.5 kg   SpO2 100%   BMI 21.24 kg/m   Physical Exam Vitals and nursing note reviewed.  Constitutional:      Appearance: She is well-developed.  HENT:     Head: Normocephalic and atraumatic.     Right Ear: Tympanic membrane and external ear normal.     Left Ear: Tympanic membrane and external ear normal.     Nose: Nose normal.  Eyes:     General:        Right eye: No discharge.        Left eye: No discharge.     Extraocular Movements: Extraocular movements intact.     Pupils: Pupils are equal, round, and reactive to light.  Cardiovascular:     Rate and Rhythm:  Normal rate and regular rhythm.     Heart sounds: Normal heart sounds.  Pulmonary:     Effort: Pulmonary effort is normal.     Breath sounds: Normal breath sounds.  Abdominal:     Palpations: Abdomen is soft.     Tenderness: There is no abdominal tenderness.  Musculoskeletal:     Cervical back: Neck supple.  Skin:    General: Skin is warm and dry.  Neurological:     Mental Status: She is alert and oriented to person, place, and time.     Comments: CN 3-12 grossly intact. 5/5 strength in all 4 extremities. Grossly normal sensation. Normal finger to nose.   Psychiatric:        Mood and Affect: Mood is not anxious.     ED Results / Procedures / Treatments   Labs (all labs ordered are listed, but only abnormal results are displayed) Labs Reviewed  PROTIME-INR - Abnormal; Notable for the following components:      Result Value   Prothrombin Time 18.5 (*)    INR 1.5 (*)    All other components within normal limits  CBC - Abnormal; Notable for the following components:   RBC 2.85 (*)    Hemoglobin 8.9 (*)    HCT 27.3 (*)    RDW 15.8 (*)    Platelets 128 (*)    All other components within normal limits  COMPREHENSIVE METABOLIC PANEL - Abnormal; Notable for the following components:   Glucose, Bld 102 (*)    Albumin 3.2 (*)    AST 56 (*)    All other components within normal limits  ETHANOL  APTT  DIFFERENTIAL  RAPID URINE DRUG SCREEN, HOSP PERFORMED  URINALYSIS, ROUTINE W REFLEX MICROSCOPIC  CBG MONITORING, ED    EKG EKG Interpretation  Date/Time:  Tuesday July 01 2020 20:07:17 EDT Ventricular Rate:  70 PR Interval:    QRS Duration: 147 QT Interval:  475 QTC Calculation: 513 R Axis:   -81 Text Interpretation: Accelerated junctional rhythm LVH with IVCD, LAD and secondary repol abnrmolarity Anterolateral infarct, recent Prolonged QT interval Confirmed by Pricilla Loveless [phone]) on  07/01/2020 8:15:59 PM   Radiology CT Angio Head W or Wo Contrast  Result Date:  07/01/2020 CLINICAL DATA:  Initial evaluation for acute stroke, slurred speech. EXAM: CT ANGIOGRAPHY HEAD AND NECK TECHNIQUE: Multidetector CT imaging of the head and neck was performed using the standard protocol during bolus administration of intravenous contrast. Multiplanar CT image reconstructions and MIPs were obtained to evaluate the vascular anatomy. Carotid stenosis measurements (when applicable) are obtained utilizing NASCET criteria, using the distal internal carotid diameter as the denominator. CONTRAST:  31mL OMNIPAQUE IOHEXOL 350 MG/ML SOLN COMPARISON:  Prior study from earlier the same day. FINDINGS: CTA NECK FINDINGS Aortic arch: Visualized aortic arch normal in caliber with normal branch pattern. Mild-to-moderate atheromatous change about the arch and origin of the great vessels without hemodynamically significant stenosis. Right carotid system: Right CCA patent from its origin to the bifurcation without stenosis. Mild atheromatous change about the right carotid bulb/proximal right ICA without significant stenosis. Right ICA patent distally without stenosis, dissection or occlusion. Left carotid system: Left CCA patent from its origin to the bifurcation without stenosis. Mild atheromatous change about the left carotid bulb/proximal left ICA without significant stenosis. Left ICA patent distally without stenosis, dissection or occlusion. Vertebral arteries: Both vertebral arteries arise from the subclavian arteries. No proximal subclavian artery stenosis. Vertebral arteries mildly tortuous but are widely patent without stenosis, dissection or occlusion. Skeleton: No visible acute osseous finding. No discrete or worrisome osseous lesions. Moderate spondylosis noted at C5-6 and C6-7. Other neck: No other acute soft tissue abnormality within the neck. Salivary glands are atrophic with innumerable sialoliths bilaterally. Enlarged multinodular goiter with substernal extension. Dominant left thyroid nodule  measures up to approximately 5.7 cm. Mass effect on the subglottic trachea which is mildly narrowed and deviated to the right. Upper chest: Enlarged precarinal lymph node measures up to 2.7 cm. Prevascular node measures 1.5 cm. Scattered subpleural reticular densities seen within the partially visualized lungs, nonspecific, but could reflect fibrosis and/or infection. Visualized esophagus is dilated and patulous with air-fluid level within the esophageal lumen. Review of the MIP images confirms the above findings CTA HEAD FINDINGS Anterior circulation: Petrous segments patent bilaterally. Scattered atheromatous change within the carotid siphons with associated mild multifocal narrowing. A1 segments patent bilaterally. Normal anterior communicating artery complex. Anterior cerebral arteries patent to their distal aspects without stenosis. No M1 stenosis or occlusion. Normal MCA bifurcations. Distal MCA branches well perfused and symmetric. Posterior circulation: Both V4 segments patent to the vertebrobasilar junction without stenosis. Both PICA origins patent and normal. Basilar patent to its distal aspect without stenosis. Superior cerebellar arteries patent bilaterally. Both PCAs supplied via the basilar and prominent bilateral posterior communicating arteries. PCAs well perfused to their distal aspects without stenosis. Venous sinuses: Grossly patent allowing for timing the contrast bolus. Anatomic variants: None significant.  No aneurysm. Review of the MIP images confirms the above findings IMPRESSION: 1. Negative CTA for large vessel occlusion. 2. Mild for age atheromatous change about the carotid bifurcations and carotid siphons without hemodynamically significant stenosis. 3. Enlarged multinodular goiter with dominant approximate 5.7 cm nodule on the left. Further evaluation with dedicated thyroid ultrasound recommended. This could be performed on a nonemergent outpatient basis. (Ref: J Am Coll Radiol. 2015  Feb;12(2): 143-50). 4. Scattered subpleural reticular densities within the partially visualized lungs, nonspecific, but could reflect fibrosis and/or infection. Correlation with plain film radiography suggested as warranted. 5. Enlarged mediastinal adenopathy as above, indeterminate, but could be reactive. 6. Atrophic appearance of the salivary glands  bilaterally with innumerable intraglandular calcifications. Findings are nonspecific, but can be seen in the setting of Sjogren's syndrome Electronically Signed   By: Rise Mu M.D.   On: 07/01/2020 23:16   CT HEAD WO CONTRAST  Result Date: 07/01/2020 CLINICAL DATA:  Transient ischemic attack. Aphasia for 5-6 minutes today, resolved. Headache. EXAM: CT HEAD WITHOUT CONTRAST TECHNIQUE: Contiguous axial images were obtained from the base of the skull through the vertex without intravenous contrast. COMPARISON:  None. FINDINGS: Brain: Brain volume is normal for age. No intracranial hemorrhage, mass effect, or midline shift. No hydrocephalus. The basilar cisterns are patent. Bilateral basal gangliar mineralization, typically senescent. Moderate periventricular and deep white matter hypodensity, nonspecific but typically chronic small vessel ischemia. No evidence of territorial infarct or acute ischemia. No extra-axial or intracranial fluid collection. Vascular: Atherosclerosis of skullbase vasculature without hyperdense vessel or abnormal calcification. Skull: No fracture or focal lesion. Sinuses/Orbits: No acute finding. No paranasal sinus inflammation or mastoid effusion. Other: Scattered parotid calcifications, nonspecific. IMPRESSION: 1. No acute intracranial abnormality. 2. Moderate chronic small vessel ischemia. Electronically Signed   By: Narda Rutherford M.D.   On: 07/01/2020 20:41   CT Angio Neck W and/or Wo Contrast  Result Date: 07/01/2020 CLINICAL DATA:  Initial evaluation for acute stroke, slurred speech. EXAM: CT ANGIOGRAPHY HEAD AND NECK  TECHNIQUE: Multidetector CT imaging of the head and neck was performed using the standard protocol during bolus administration of intravenous contrast. Multiplanar CT image reconstructions and MIPs were obtained to evaluate the vascular anatomy. Carotid stenosis measurements (when applicable) are obtained utilizing NASCET criteria, using the distal internal carotid diameter as the denominator. CONTRAST:  75mL OMNIPAQUE IOHEXOL 350 MG/ML SOLN COMPARISON:  Prior study from earlier the same day. FINDINGS: CTA NECK FINDINGS Aortic arch: Visualized aortic arch normal in caliber with normal branch pattern. Mild-to-moderate atheromatous change about the arch and origin of the great vessels without hemodynamically significant stenosis. Right carotid system: Right CCA patent from its origin to the bifurcation without stenosis. Mild atheromatous change about the right carotid bulb/proximal right ICA without significant stenosis. Right ICA patent distally without stenosis, dissection or occlusion. Left carotid system: Left CCA patent from its origin to the bifurcation without stenosis. Mild atheromatous change about the left carotid bulb/proximal left ICA without significant stenosis. Left ICA patent distally without stenosis, dissection or occlusion. Vertebral arteries: Both vertebral arteries arise from the subclavian arteries. No proximal subclavian artery stenosis. Vertebral arteries mildly tortuous but are widely patent without stenosis, dissection or occlusion. Skeleton: No visible acute osseous finding. No discrete or worrisome osseous lesions. Moderate spondylosis noted at C5-6 and C6-7. Other neck: No other acute soft tissue abnormality within the neck. Salivary glands are atrophic with innumerable sialoliths bilaterally. Enlarged multinodular goiter with substernal extension. Dominant left thyroid nodule measures up to approximately 5.7 cm. Mass effect on the subglottic trachea which is mildly narrowed and deviated  to the right. Upper chest: Enlarged precarinal lymph node measures up to 2.7 cm. Prevascular node measures 1.5 cm. Scattered subpleural reticular densities seen within the partially visualized lungs, nonspecific, but could reflect fibrosis and/or infection. Visualized esophagus is dilated and patulous with air-fluid level within the esophageal lumen. Review of the MIP images confirms the above findings CTA HEAD FINDINGS Anterior circulation: Petrous segments patent bilaterally. Scattered atheromatous change within the carotid siphons with associated mild multifocal narrowing. A1 segments patent bilaterally. Normal anterior communicating artery complex. Anterior cerebral arteries patent to their distal aspects without stenosis. No M1 stenosis or occlusion. Normal  MCA bifurcations. Distal MCA branches well perfused and symmetric. Posterior circulation: Both V4 segments patent to the vertebrobasilar junction without stenosis. Both PICA origins patent and normal. Basilar patent to its distal aspect without stenosis. Superior cerebellar arteries patent bilaterally. Both PCAs supplied via the basilar and prominent bilateral posterior communicating arteries. PCAs well perfused to their distal aspects without stenosis. Venous sinuses: Grossly patent allowing for timing the contrast bolus. Anatomic variants: None significant.  No aneurysm. Review of the MIP images confirms the above findings IMPRESSION: 1. Negative CTA for large vessel occlusion. 2. Mild for age atheromatous change about the carotid bifurcations and carotid siphons without hemodynamically significant stenosis. 3. Enlarged multinodular goiter with dominant approximate 5.7 cm nodule on the left. Further evaluation with dedicated thyroid ultrasound recommended. This could be performed on a nonemergent outpatient basis. (Ref: J Am Coll Radiol. 2015 Feb;12(2): 143-50). 4. Scattered subpleural reticular densities within the partially visualized lungs, nonspecific,  but could reflect fibrosis and/or infection. Correlation with plain film radiography suggested as warranted. 5. Enlarged mediastinal adenopathy as above, indeterminate, but could be reactive. 6. Atrophic appearance of the salivary glands bilaterally with innumerable intraglandular calcifications. Findings are nonspecific, but can be seen in the setting of Sjogren's syndrome Electronically Signed   By: Rise Mu M.D.   On: 07/01/2020 23:16   CT ABDOMEN PELVIS W CONTRAST  Result Date: 07/01/2020 CLINICAL DATA:  Weight loss EXAM: CT ABDOMEN AND PELVIS WITH CONTRAST TECHNIQUE: Multidetector CT imaging of the abdomen and pelvis was performed using the standard protocol following bolus administration of intravenous contrast. CONTRAST:  ISOVUE-300 IOPAMIDOL (ISOVUE-300) INJECTION 61% COMPARISON:  None. FINDINGS: Lower chest: Cardiomegaly. Pacer wires noted in the right heart. Scarring/fibrosis in the lung bases. Hepatobiliary: Small layering gallstones within the gallbladder. No focal hepatic abnormality. Pancreas: No focal abnormality or ductal dilatation. Spleen: No focal abnormality.  Normal size. Adrenals/Urinary Tract: No adrenal abnormality. No focal renal abnormality. No stones or hydronephrosis. Urinary bladder is unremarkable. Stomach/Bowel: Moderate stool throughout the colon. Stomach, large and small bowel grossly unremarkable. Few scattered sigmoid diverticula. Vascular/Lymphatic: Scattered aortic atherosclerosis. No evidence of aneurysm or adenopathy. Reproductive: Calcifications throughout the uterus, likely fibroids. No adnexal mass. Other: No free fluid or free air. Musculoskeletal: Advanced degenerative disc and facet disease throughout the lumbar spine. Severe disc disease at L4-5 with complete disc space loss. 15 mm of anterolisthesis of L4 on L5 related to severe facet disease. Mild compression fracture through the superior endplate of L3, age indeterminate. IMPRESSION: Cardiomegaly.  Scarring/fibrosis in the lung bases. Scattered sigmoid diverticula. Cholelithiasis. Uterine fibroids. Severe degenerative disc and facet disease in the lower lumbar spine with grade 2 anterolisthesis at L4-5. Age-indeterminate compression fracture at L3. Electronically Signed   By: Charlett Nose M.D.   On: 07/01/2020 09:10    Procedures Procedures   Medications Ordered in ED Medications  iohexol (OMNIPAQUE) 350 MG/ML injection 75 mL (75 mLs Intravenous Contrast Given 07/01/20 2156)    ED Course  I have reviewed the triage vital signs and the nursing notes.  Pertinent labs & imaging results that were available during my care of the patient were reviewed by me and considered in my medical decision making (see chart for details).    MDM Rules/Calculators/A&P                          Patient's symptoms sound like a TIA.  She has known A. fib and is already on Xarelto.  I  discussed with neurology, Dr. Derry Lory, given the negative work-up.  He recommends CT angiography of head and neck to assess her major arteries.  These do not show significant atherosclerosis so he recommends follow-up with neurology in 1-2 weeks but she would otherwise be okay for discharge home.  Antiplatelets do not need to be added.  We also discussed the ancillary findings such as goiter, lymph nodes, fibrosis, etc. and she and her family are already aware of all of these.  We discussed return precautions. Final Clinical Impression(s) / ED Diagnoses Final diagnoses:  TIA (transient ischemic attack)  Goiter    Rx / DC Orders ED Discharge Orders         Ordered    Ambulatory referral to Neurology       Comments: An appointment is requested in approximately: 1 week   07/01/20 2333           Pricilla Loveless, MD 07/01/20 2350

## 2020-07-01 NOTE — ED Triage Notes (Signed)
Per pt family they were sitting there talking and all of a sudden the pt grabbed the side of her face and was not able to talk  Family states she was unable to say her name  When she was able to talk her speech was slurred and the words did not make sense  EMS came out to the house   Family states sxs resolved in about 5 or 6 minutes  Family also states the pt has been complaining that her hands and feet have been numb today  Pt able to speak clearly upon arrival to the hospital  Pt is c/o headache today

## 2020-07-01 NOTE — Discharge Instructions (Addendum)
If you develop recurrent or new symptoms such as trouble speaking, severe headache, weakness or numbness, or any other new/concerning symptoms then call 911 or return to the ER.   Your CT scan showed some other findings, such as a thyroid goiter and nonspecific lymph nodes. You will need follow up by your primary care physician for an outpatient ultrasound

## 2020-07-02 LAB — URINALYSIS, ROUTINE W REFLEX MICROSCOPIC
Bilirubin Urine: NEGATIVE
Glucose, UA: NEGATIVE mg/dL
Hgb urine dipstick: NEGATIVE
Ketones, ur: NEGATIVE mg/dL
Leukocytes,Ua: NEGATIVE
Nitrite: NEGATIVE
Protein, ur: NEGATIVE mg/dL
Specific Gravity, Urine: <1.005 — ABNORMAL LOW (ref 1.005–1.030)
pH: 6.5 (ref 5.0–8.0)

## 2020-07-02 LAB — RAPID URINE DRUG SCREEN, HOSP PERFORMED
Amphetamines: NOT DETECTED
Barbiturates: NOT DETECTED
Benzodiazepines: NOT DETECTED
Cocaine: NOT DETECTED
Opiates: NOT DETECTED
Tetrahydrocannabinol: NOT DETECTED

## 2020-07-02 NOTE — Progress Notes (Signed)
NEUROLOGY CONSULTATION NOTE  Lindsey Huber MRN: [id number] DOB: 07-28-1945  Referring provider: Pricilla Loveless, MD (ED referral) Primary care provider: Evangeline Dakin, [name]  Reason for consult:  TIA  Assessment/Plan:    Transient neurologic symptoms - May have been a TIA but given altered awareness, also consider seizure. Left temporal headache - returned after discontinuation of prednisone.  Consider temporal arteritis.  Sed rate only mildly elevated but would check CRP A fib Hypertension  1.Continue Xarelto and blood pressure medications 2.Check lipid panel and Hgb A1c 3.Check CRP 4.Check EEG 5.Further recommendations pending results.  Otherwise, follow up in 6 months.   Subjective:  Lindsey Huber is a 75 year old right-handed female with a fib, cardiomyopathy, HTN, CHF, lupus, pulmonary fibrosis and CKD who presents for TIA.  History supplemented by ED note.  Current medications:  Xarelto, Toprol-XL, furosemide,   On 07/01/2020, the patient experienced a mild headache off and on during the day.  She also had a left temporal headache and noted intermittent numbness and tingling in the bilateral lower extremities.  That evening, she was talking with family when she suddenly started holding the right side of her face, eyes became big and she started drooling.  When her family spoke to her, she could only moan.  She was unable to repeat.  She could not smile but did not exhibit facial droop or unilateral weakness.  After 5-6 minutes, she started speaking but it was slurred and didn't make sense.  She was unable to repeat phrases.  She has no recollection of this.  No convulsions or incontinence  She was brought to the ED.  CTA of head and neck personally reviewed was negative for large vessel occlusion or hemodynamically significant stenosis.  Unable to have an MRI due to pacemaker.  She has a fib which is treated with Xarelto.    She has remote history of left sided temporal  headaches (sometimes radiates to right side).  She has lupus and was on prednisone until February.  Headaches returned about a month later.  She sometimes reports blurred vision.  Sed rate on 06/27/2020 was 58.     PAST MEDICAL HISTORY: Past Medical History:  Diagnosis Date   A-fib (HCC)    Anemia    Cardiomyopathy (HCC)    CHF (congestive heart failure) (HCC)    CKD (chronic kidney disease)    Diverticulosis    Hypertension    Lupus (HCC)    Osteoarthritis    Pulmonary fibrosis (HCC)    Vitamin D deficiency     PAST SURGICAL HISTORY: Past Surgical History:  Procedure Laterality Date   CARDIAC VALVE SURGERY     CARPAL TUNNEL RELEASE     HEMORRHOID SURGERY     PACEMAKER INSERTION     TONSILLECTOMY      MEDICATIONS: Current Outpatient Medications on File Prior to Visit  Medication Sig Dispense Refill   acetaminophen (TYLENOL) 650 MG CR tablet Take 650 mg by mouth every 8 (eight) hours as needed for pain.     albuterol (VENTOLIN HFA) 108 (90 Base) MCG/ACT inhaler Inhale 2 puffs into the lungs every 6 (six) hours as needed for wheezing or shortness of breath.     allopurinol (ZYLOPRIM) 100 MG tablet Take 1 tablet (100 mg total) by mouth daily. 30 tablet 0   amoxicillin (AMOXIL) 500 MG capsule For dental procedures     BREO ELLIPTA 100-25 MCG/INH AEPB 1 puff daily.     calcium carbonate (OSCAL) 1500 (600  Ca) MG TABS tablet Take by mouth 2 (two) times daily with a meal. (Patient not taking: Reported on 06/27/2020)     Calcium Carbonate-Vitamin D (CALTRATE 600+D PO)      cetirizine HCl (ZYRTEC) 5 MG/5ML SOLN Take 5 mg by mouth daily.     Cholecalciferol 25 MCG (1000 UT) tablet Take 1 tablet by mouth daily.     feeding supplement (ENSURE IMMUNE HEALTH) LIQD Take 237 mLs by mouth 3 (three) times daily with meals.     fluticasone (FLONASE) 50 MCG/ACT nasal spray Place 2 sprays into both nostrils daily.     Fluticasone-Umeclidin-Vilant (TRELEGY ELLIPTA) 100-62.5-25 MCG/INH AEPB Inhale  into the lungs.     folic acid (FOLVITE) 800 MCG tablet Take 400 mcg by mouth daily.     furosemide (LASIX) 20 MG tablet Take 20 mg by mouth.     hydrocortisone (CORTEF) 5 MG tablet Take 5 mg by mouth 2 (two) times daily.     hydroxychloroquine (PLAQUENIL) 200 MG tablet Take 1 tablet (200 mg total) by mouth daily. 30 tablet 0   levothyroxine (SYNTHROID) 25 MCG tablet Take 25 mcg by mouth daily. (Patient not taking: Reported on 06/27/2020)     levothyroxine (SYNTHROID) 75 MCG tablet Take 75 mcg by mouth daily before breakfast.     metoprolol succinate (TOPROL-XL) 25 MG 24 hr tablet Take 1 tablet by mouth daily.     metoprolol tartrate (LOPRESSOR) 25 MG tablet Take 25 mg by mouth daily. (Patient not taking: Reported on 06/27/2020)     Multiple Vitamin (MULTIVITAMIN) LIQD Take 5 mLs by mouth daily.     Nintedanib (OFEV) 150 MG CAPS Take 150 mg by mouth 2 (two) times daily.     Rivaroxaban (XARELTO) 15 MG TABS tablet Take 1 tablet (15 mg total) by mouth daily with supper. 30 tablet 11   senna-docusate (SENOKOT-S) 8.6-50 MG tablet Take 2 tablets by mouth as needed for irritation.     No current facility-administered medications on file prior to visit.    ALLERGIES: Allergies  Allergen Reactions   Propofol     Other reaction(s): asthma, Unknown/Patient and Family Unable to Define   Amlodipine Other (See Comments)    Other reaction(s): unknown Tired, syncope   Flecainide     Other reaction(s): unknown   Amiodarone     Other reaction(s): Delerium/Confusion/Psychosis, unknown    FAMILY HISTORY: Family History  Problem Relation Age of Onset   Heart disease Mother    Hypertension Mother    Rheum arthritis Mother    Osteoarthritis Mother    Heart disease Father    Hypertension Father    Osteoarthritis Father    Heart disease Sister    Diabetes Brother    Cancer Brother     Objective:  Blood pressure (!) 158/87, pulse 82, height 5\' 2"  (1.575 m), weight 118 lb (53.5 kg), SpO2 100  %. General: No acute distress.  Patient appears well-groomed.   Head:  Normocephalic/atraumatic Eyes:  fundi examined but not visualized Neck: supple, no paraspinal tenderness, full range of motion Back: No paraspinal tenderness Heart: regular rate and rhythm Lungs: Clear to auscultation bilaterally. Vascular: No carotid bruits. Neurological Exam: Mental status: alert and oriented to person, place, and time, recent and remote memory intact, fund of knowledge intact, attention and concentration intact, speech fluent and not dysarthric, language intact. Cranial nerves: CN I: not tested CN II: pupils equal, round and reactive to light, visual fields intact CN III, IV, VI:  full  range of motion, no nystagmus, no ptosis CN V: facial sensation intact. CN VII: upper and lower face symmetric CN VIII: hearing intact CN IX, X: gag intact, uvula midline CN XI: sternocleidomastoid and trapezius muscles intact CN XII: tongue midline Bulk & Tone: normal, no fasciculations. Motor:  muscle strength 5/5 throughout Sensation:  Pinprick, temperature and vibratory sensation intact. Deep Tendon Reflexes:  2+ throughout,  toes downgoing.   Finger to nose testing:  Without dysmetria.   Heel to shin:  Without dysmetria.   Gait:  Normal station and stride.  Romberg negative.    Thank you for allowing me to take part in the care of this patient.  Shon Millet, DO  CC: Evangeline Dakin, [name]

## 2020-07-03 ENCOUNTER — Other Ambulatory Visit (INDEPENDENT_AMBULATORY_CARE_PROVIDER_SITE_OTHER): Payer: Medicare HMO

## 2020-07-03 ENCOUNTER — Other Ambulatory Visit: Payer: Self-pay

## 2020-07-03 ENCOUNTER — Encounter: Payer: Self-pay | Admitting: Neurology

## 2020-07-03 ENCOUNTER — Ambulatory Visit (INDEPENDENT_AMBULATORY_CARE_PROVIDER_SITE_OTHER): Payer: Medicare HMO | Admitting: Neurology

## 2020-07-03 VITALS — BP 158/87 | HR 82 | Ht 62.0 in | Wt 118.0 lb

## 2020-07-03 DIAGNOSIS — G459 Transient cerebral ischemic attack, unspecified: Secondary | ICD-10-CM

## 2020-07-03 DIAGNOSIS — R404 Transient alteration of awareness: Secondary | ICD-10-CM | POA: Diagnosis not present

## 2020-07-03 DIAGNOSIS — I10 Essential (primary) hypertension: Secondary | ICD-10-CM

## 2020-07-03 DIAGNOSIS — I480 Paroxysmal atrial fibrillation: Secondary | ICD-10-CM | POA: Diagnosis not present

## 2020-07-03 DIAGNOSIS — R739 Hyperglycemia, unspecified: Secondary | ICD-10-CM

## 2020-07-03 DIAGNOSIS — R519 Headache, unspecified: Secondary | ICD-10-CM | POA: Diagnosis not present

## 2020-07-03 DIAGNOSIS — M329 Systemic lupus erythematosus, unspecified: Secondary | ICD-10-CM

## 2020-07-03 LAB — HEMOGLOBIN A1C: Hgb A1c MFr Bld: 5.4 % (ref 4.6–6.5)

## 2020-07-03 LAB — LIPID PANEL
Cholesterol: 134 mg/dL (ref 0–200)
HDL: 59.1 mg/dL (ref >39.00)
LDL Cholesterol: 60 mg/dL (ref 0–99)
NonHDL: 75.35
Total CHOL/HDL Ratio: 2
Triglycerides: 76 mg/dL (ref 0.0–149.0)
VLDL: 15.2 mg/dL (ref 0.0–40.0)

## 2020-07-03 LAB — C-REACTIVE PROTEIN: CRP: 1.2 mg/dL (ref 0.5–20.0)

## 2020-07-03 NOTE — Patient Instructions (Addendum)
Check lipid panel, Hgb A1c, and CRP.Your provider has requested that you have labwork completed today. Please go to [facility] ([id number]) on the second floor of this building before leaving the office today. You do not need to check in. If you are not called within 15 minutes please check with the front desk.   Check EEG Further recommendations pending results Follow up 6 months.

## 2020-07-04 ENCOUNTER — Telehealth: Payer: Self-pay

## 2020-07-04 NOTE — Telephone Encounter (Signed)
Pt called no answer per DPR left a voice mail All labs look okay.  No change in management of care

## 2020-07-04 NOTE — Telephone Encounter (Signed)
-----   Message from Drema Dallas, DO sent at 07/04/2020  6:19 AM EDT ----- All labs look okay.  No change in management.

## 2020-07-06 NOTE — Progress Notes (Signed)
Lab tests show increased sedimentation rate which indicates inflammation. Other markers for lupus activity were negative. Uric acid level is good so do not suspect gout is causing symptoms. I did see she had a recent episode at the ED with neurology follow up. No specific treatment changes recommended from our visit currently, we will follow up.

## 2020-07-07 NOTE — Progress Notes (Signed)
Spoke with patient, advised lab tests show increased sedimentation rate which indicates inflammation. Other markers for lupus activity were negative. Uric acid level is good so do not suspect gout is causing symptoms. Dr. Dimple Casey did see she had a recent episode at the ED with neurology follow up. No specific treatment changes recommended from ourvisit currently, we will follow up.

## 2020-07-08 ENCOUNTER — Other Ambulatory Visit: Payer: Self-pay | Admitting: Gastroenterology

## 2020-07-08 ENCOUNTER — Ambulatory Visit
Admission: RE | Admit: 2020-07-08 | Discharge: 2020-07-08 | Disposition: A | Discharge status: Home / Self Care | Payer: Medicare HMO | Source: Ambulatory Visit | Attending: Gastroenterology | Admitting: Gastroenterology

## 2020-07-08 DIAGNOSIS — R1319 Other dysphagia: Secondary | ICD-10-CM

## 2020-07-10 ENCOUNTER — Emergency Department (HOSPITAL_COMMUNITY): Payer: Medicare HMO

## 2020-07-10 ENCOUNTER — Emergency Department (HOSPITAL_COMMUNITY)
Admission: EM | Admit: 2020-07-10 | Discharge: 2020-07-10 | Disposition: A | Discharge status: Home / Self Care | Payer: Medicare HMO | Attending: Emergency Medicine | Admitting: Emergency Medicine

## 2020-07-10 ENCOUNTER — Other Ambulatory Visit: Payer: Self-pay

## 2020-07-10 DIAGNOSIS — I5032 Chronic diastolic (congestive) heart failure: Secondary | ICD-10-CM | POA: Diagnosis not present

## 2020-07-10 DIAGNOSIS — N183 Chronic kidney disease, stage 3 unspecified: Secondary | ICD-10-CM | POA: Diagnosis not present

## 2020-07-10 DIAGNOSIS — E162 Hypoglycemia, unspecified: Secondary | ICD-10-CM

## 2020-07-10 DIAGNOSIS — Z7901 Long term (current) use of anticoagulants: Secondary | ICD-10-CM | POA: Diagnosis not present

## 2020-07-10 DIAGNOSIS — I130 Hypertensive heart and chronic kidney disease with heart failure and stage 1 through stage 4 chronic kidney disease, or unspecified chronic kidney disease: Secondary | ICD-10-CM | POA: Diagnosis not present

## 2020-07-10 DIAGNOSIS — D649 Anemia, unspecified: Secondary | ICD-10-CM

## 2020-07-10 DIAGNOSIS — E1122 Type 2 diabetes mellitus with diabetic chronic kidney disease: Secondary | ICD-10-CM | POA: Insufficient documentation

## 2020-07-10 DIAGNOSIS — R479 Unspecified speech disturbances: Secondary | ICD-10-CM | POA: Insufficient documentation

## 2020-07-10 DIAGNOSIS — R519 Headache, unspecified: Secondary | ICD-10-CM | POA: Diagnosis present

## 2020-07-10 DIAGNOSIS — Z7951 Long term (current) use of inhaled steroids: Secondary | ICD-10-CM | POA: Insufficient documentation

## 2020-07-10 DIAGNOSIS — Z950 Presence of cardiac pacemaker: Secondary | ICD-10-CM | POA: Insufficient documentation

## 2020-07-10 DIAGNOSIS — D696 Thrombocytopenia, unspecified: Secondary | ICD-10-CM

## 2020-07-10 DIAGNOSIS — Z79899 Other long term (current) drug therapy: Secondary | ICD-10-CM | POA: Insufficient documentation

## 2020-07-10 DIAGNOSIS — G459 Transient cerebral ischemic attack, unspecified: Secondary | ICD-10-CM

## 2020-07-10 LAB — BASIC METABOLIC PANEL
Anion gap: 6 (ref 5–15)
BUN: 11 mg/dL (ref 8–23)
CO2: 21 mmol/L — ABNORMAL LOW (ref 22–32)
Calcium: 7.6 mg/dL — ABNORMAL LOW (ref 8.9–10.3)
Chloride: 112 mmol/L — ABNORMAL HIGH (ref 98–111)
Creatinine, Ser: 0.67 mg/dL (ref 0.44–1.00)
GFR, Estimated: >60 mL/min (ref >60)
Glucose, Bld: 66 mg/dL — ABNORMAL LOW (ref 70–99)
Potassium: 3.5 mmol/L (ref 3.5–5.1)
Sodium: 139 mmol/L (ref 135–145)

## 2020-07-10 LAB — CBC
HCT: 25.8 % — ABNORMAL LOW (ref 36.0–46.0)
Hemoglobin: 8.3 g/dL — ABNORMAL LOW (ref 12.0–15.0)
MCH: 32.4 pg (ref 26.0–34.0)
MCHC: 32.2 g/dL (ref 30.0–36.0)
MCV: 100.8 fL — ABNORMAL HIGH (ref 80.0–100.0)
Platelets: 92 10*3/uL — ABNORMAL LOW (ref 150–400)
RBC: 2.56 MIL/uL — ABNORMAL LOW (ref 3.87–5.11)
RDW: 16.3 % — ABNORMAL HIGH (ref 11.5–15.5)
WBC: 4 10*3/uL (ref 4.0–10.5)
nRBC: 0 % (ref 0.0–0.2)

## 2020-07-10 LAB — CBG MONITORING, ED
Glucose-Capillary: 44 mg/dL — CL (ref 70–99)
Glucose-Capillary: 76 mg/dL (ref 70–99)

## 2020-07-10 NOTE — ED Notes (Signed)
Pt found to have CBG 44. Pt alert, caox4 in no obvious distress. Pt drank 4 oz apple juice, 4 oz orange juice, and ate two more sticks of cheese.

## 2020-07-10 NOTE — ED Triage Notes (Signed)
Pt arrived via [facility] from home. Per EMS pt family states at 1700 headache 1703 family noticed pt having episode of aphasia lasting till 34 per family. EMS reports that stroke scale was negative throughout their care. Pt arrived to ED caox4 in no obvious distress, stating she is still having slight headache but that it has improved since initial onset. Pt had similar incident occurrence and evaluation at ED on 07/01/20. No obvious neurological deficit on arrival to ED.   BP 189/103. HR 70 with pacemaker  RR 16 CBG 101 SpO2 97%  20 g L forearm

## 2020-07-10 NOTE — ED Notes (Signed)
D/c instructions reviewed and explained, pt verbalized understanding. 

## 2020-07-10 NOTE — ED Notes (Signed)
Sprite, graham crackers and cheese provided after pt passed stroke swallow screen.

## 2020-07-10 NOTE — Discharge Instructions (Addendum)
It was our pleasure to provide your ER care today - we hope that you feel better.  From today's labs, your blood sugar was low - make sure to eat meals regularly and not to skip/delay meals. Consider supplementing nutrition with Boost, Ensure, or other nutritious shake.  Also from the labs, your calcium level is mildly low, and blood count low (hemoglobin 8.3) and platelet count low (92).  Follow up with your primary care doctor in the coming week for recheck, and to discuss above lab results.   Also follow up with neurologist in the next 1-2 weeks.   Return to ER right away if worse, new symptoms, fevers, chest pain, trouble breathing, weak/fainting, change in speech  or vision, numbness/weakness, or other concern.

## 2020-07-10 NOTE — ED Provider Notes (Addendum)
[facility] EMERGENCY DEPARTMENT Provider Note   CSN: [id number] Arrival date & time: 07/10/20  1853     History Chief Complaint  Patient presents with   Aphasia    Lindsey Huber is a 75 y.o. female.  Patient presents via EMS with family noting that earlier tonight pt with episode of trouble speaking that lasted approximately 30 minutes. Symptoms acute onset, moderate, episodic, now resolved. Pt says she felt fine earlier other than mild headache, which is resolving. No acute, abrupt or severe head pain. No neck pain or stiffness. Denies change in vision. Denies numbness, weakness, problems w balance or coordination, or loss of normal functional ability. EMS did not note any focal deficits on exam. CBG 101.   The history is provided by the patient and the EMS personnel.      Past Medical History:  Diagnosis Date   A-fib (HCC)    Anemia    Cardiomyopathy (HCC)    CHF (congestive heart failure) (HCC)    CKD (chronic kidney disease)    Diverticulosis    Hypertension    Lupus (HCC)    Osteoarthritis    Pulmonary fibrosis (HCC)    Vitamin D deficiency     Patient Active Problem List   Diagnosis Date Noted   Idiopathic gout of multiple sites 06/27/2020   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   Vitamin D deficiency 05/01/2020   Mammogram abnormal 05/01/2020   Proteinuria 05/01/2020   Other bilateral secondary osteoarthritis of knee 05/01/2020   Osteoarthritis of hip 05/01/2020   HTN (hypertension) 05/01/2020   GERD without esophagitis 05/01/2020   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HCC) 05/01/2020   Cardiomyopathy (HCC) 05/01/2020   Iatrogenic hypotension 05/01/2020   Hypercoagulability due to atrial fibrillation (HCC) 05/01/2020   Systemic lupus erythematosus (HCC) 05/01/2020   Chronic heart failure with preserved ejection fraction (HCC) 05/01/2020   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   Paroxysmal atrial fibrillation with RVR (HCC)  05/01/2020   Protein calorie malnutrition (HCC) 05/01/2020   Chronic kidney disease (CKD), active medical management without dialysis, stage 3 (moderate) (HCC) 05/01/2020   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     Family History  Problem Relation Age of Onset   Heart disease Mother    Hypertension Mother    Rheum arthritis Mother    Osteoarthritis Mother    Heart disease Father    Hypertension Father    Osteoarthritis Father    Heart disease Sister    Diabetes Brother    Cancer Brother     Social History   Tobacco Use   Smoking status: Never   Smokeless tobacco: Never  Vaping Use   Vaping Use: Never used  Substance Use Topics   Alcohol use: Never   Drug use: Never    Home Medications Prior to Admission medications   Medication Sig Start Date End Date Taking? Authorizing Provider  acetaminophen (TYLENOL) 650 MG CR tablet Take  650 mg by mouth every 8 (eight) hours as needed for pain.    [provider]  albuterol (VENTOLIN HFA) 108 (90 Base) MCG/ACT inhaler Inhale 2 puffs into the lungs every 6 (six) hours as needed for wheezing or shortness of breath.    [provider]  allopurinol (ZYLOPRIM) 100 MG tablet Take 1 tablet (100 mg total) by mouth daily. 06/27/20   Fuller Plan, MD  amoxicillin (AMOXIL) 500 MG capsule For dental procedures 03/30/20   [provider]  BREO ELLIPTA 100-25 MCG/INH AEPB 1 puff daily.  06/18/20   [provider]  calcium carbonate (OSCAL) 1500 (600 Ca) MG TABS tablet Take by mouth 2 (two) times daily with a meal. Patient not taking: No sig reported    [provider]  Calcium Carbonate-Vitamin D (CALTRATE 600+D PO)     [provider]  cetirizine HCl (ZYRTEC) 5 MG/5ML SOLN Take 5 mg by mouth daily.    [provider]  Cholecalciferol 25 MCG (1000 UT) tablet Take 1 tablet by mouth daily.    [provider]  feeding supplement (ENSURE IMMUNE HEALTH) LIQD Take 237 mLs by mouth 3 (three) times daily with meals.    [provider]  fluticasone (FLONASE) 50 MCG/ACT nasal spray Place 2 sprays into both nostrils daily.    [provider]  Fluticasone-Umeclidin-Vilant (TRELEGY ELLIPTA) 100-62.5-25 MCG/INH AEPB Inhale into the lungs.    [provider]  folic acid (FOLVITE) 800 MCG tablet Take 400 mcg by mouth daily.    [provider]  furosemide (LASIX) 20 MG tablet Take 20 mg by mouth.    [provider]  hydrocortisone (CORTEF) 5 MG tablet Take 5 mg by mouth 2 (two) times daily.    [provider]  hydroxychloroquine (PLAQUENIL) 200 MG tablet Take 1 tablet (200 mg total) by mouth daily. 06/27/20   Fuller Plan, MD  levothyroxine (SYNTHROID) 25 MCG tablet Take 25 mcg by mouth daily. 04/04/20   [provider]  metoprolol succinate (TOPROL-XL) 25 MG 24 hr tablet Take 1 tablet by mouth daily. 06/18/20   [provider]  metoprolol tartrate (LOPRESSOR) 25 MG tablet Take 25 mg by mouth daily.    [provider]  Multiple Vitamin (MULTIVITAMIN) LIQD Take 5 mLs by mouth daily.    [provider]  Nintedanib (OFEV) 150 MG CAPS Take 150 mg by mouth 2 (two) times daily.    [provider]  Rivaroxaban (XARELTO) 15 MG TABS tablet Take 1 tablet (15 mg total) by mouth daily with supper. 05/20/20   Lanier Prude, MD  senna-docusate (SENOKOT-S) 8.6-50  MG tablet Take 2 tablets by mouth as needed for irritation. 02/24/20   [provider]    Allergies    Propofol, Amlodipine, Flecainide, and Amiodarone  Review of Systems   Review of Systems  Constitutional:  Negative for fever.  HENT:  Negative for sore throat and trouble swallowing.   Eyes:  Negative for pain and visual disturbance.  Respiratory:  Negative for shortness of breath.   Cardiovascular:  Negative for chest pain.  Gastrointestinal:  Negative for abdominal pain, nausea and vomiting.  Genitourinary:  Negative for dysuria and flank pain.  Musculoskeletal:  Negative for back pain and neck pain.  Skin:  Negative for rash.  Neurological:  Positive for speech difficulty and headaches. Negative for weakness and numbness.  Hematological:  Does not bruise/bleed easily.  Psychiatric/Behavioral:  Negative for agitation.    Physical  Exam Updated Vital Signs BP (!) 159/91   Pulse 72   Temp 98 F (36.7 C) (Oral)   Resp 16   SpO2 100%   Physical Exam Vitals and nursing note reviewed.  Constitutional:      Appearance: Normal appearance. She is well-developed.  HENT:     Head: Atraumatic.     Nose: Nose normal.     Mouth/Throat:     Mouth: Mucous membranes are moist.  Eyes:     General: No scleral icterus.    Conjunctiva/sclera: Conjunctivae normal.     Pupils: Pupils are equal, round, and reactive to light.  Neck:     Vascular: No carotid bruit.     Trachea: No tracheal deviation.     Comments: No stiffness or rigidity.  Cardiovascular:     Rate and Rhythm: Normal rate and regular rhythm.     Pulses: Normal pulses.     Heart sounds: Normal heart sounds. No murmur heard.   No friction rub. No gallop.  Pulmonary:     Effort: Pulmonary effort is normal. No respiratory distress.     Breath sounds: Normal breath sounds.  Abdominal:     General: Bowel sounds are normal. There is no distension.     Palpations: Abdomen is soft.     Tenderness: There is no  abdominal tenderness. There is no guarding.  Genitourinary:    Comments: No cva tenderness. Yellowish/light brown stool, heme neg.  Musculoskeletal:        General: No swelling or tenderness.     Cervical back: Normal range of motion and neck supple. No rigidity. No muscular tenderness.  Skin:    General: Skin is warm and dry.     Findings: No rash.  Neurological:     Mental Status: She is alert.     Comments: Alert, speech normal. No dysarthria or aphasia. Motor intact bil, stre 5/5. No pronator drift. Sensation grossly intact bil.   Psychiatric:        Mood and Affect: Mood normal.    ED Results / Procedures / Treatments   Labs (all labs ordered are listed, but only abnormal results are displayed) Results for orders placed or performed during the hospital encounter of 07/10/20  CBC  Result Value Ref Range   WBC 4.0 4.0 - 10.5 K/uL   RBC 2.56 (L) 3.87 - 5.11 MIL/uL   Hemoglobin 8.3 (L) 12.0 - 15.0 g/dL   HCT 16.6 (L) 06.3 - 01.6 %   MCV 100.8 (H) 80.0 - 100.0 fL   MCH 32.4 26.0 - 34.0 pg   MCHC 32.2 30.0 - 36.0 g/dL   RDW 01.0 (H) 93.2 - 35.5 %   Platelets 92 (L) 150 - 400 K/uL   nRBC 0.0 0.0 - 0.2 %  Basic metabolic panel  Result Value Ref Range   Sodium 139 135 - 145 mmol/L   Potassium 3.5 3.5 - 5.1 mmol/L   Chloride 112 (H) 98 - 111 mmol/L   CO2 21 (L) 22 - 32 mmol/L   Glucose, Bld 66 (L) 70 - 99 mg/dL   BUN 11 8 - 23 mg/dL   Creatinine, Ser 7.32 0.44 - 1.00 mg/dL   Calcium 7.6 (L) 8.9 - 10.3 mg/dL   GFR, Estimated >20 >25 mL/min   Anion gap 6 5 - 15  POC CBG, ED  Result Value Ref Range   Glucose-Capillary 44 (LL) 70 - 99 mg/dL     CT HEAD WO CONTRAST  Result Date:  07/10/2020 CLINICAL DATA:  Aphasia EXAM: CT HEAD WITHOUT CONTRAST TECHNIQUE: Contiguous axial images were obtained from the base of the skull through the vertex without intravenous contrast. COMPARISON:  None. FINDINGS: Brain: There is atrophy and chronic small vessel disease changes. No acute  intracranial abnormality. Specifically, no hemorrhage, hydrocephalus, mass lesion, acute infarction, or significant intracranial injury. Vascular: No hyperdense vessel or unexpected calcification. Skull: Insert calvarium Sinuses/Orbits: No acute findings Other: None IMPRESSION: Atrophy, chronic microvascular disease. No acute intracranial abnormality. Electronically Signed   By: Charlett Nose M.D.   On: 07/10/2020 20:08    EKG EKG Interpretation  Date/Time:  Thursday July 10 2020 18:55:11 EDT Ventricular Rate:  71 PR Interval:  275 QRS Duration: 148 QT Interval:  483 QTC Calculation: 525 R Axis:   -80 Text Interpretation: Ventricular-paced rhythm No further analysis attempted due to paced rhythm Confirmed by Cathren Laine ([id number]) on 07/10/2020 6:59:54 PM  Radiology CT HEAD WO CONTRAST  Result Date: 07/10/2020 CLINICAL DATA:  Aphasia EXAM: CT HEAD WITHOUT CONTRAST TECHNIQUE: Contiguous axial images were obtained from the base of the skull through the vertex without intravenous contrast. COMPARISON:  None. FINDINGS: Brain: There is atrophy and chronic small vessel disease changes. No acute intracranial abnormality. Specifically, no hemorrhage, hydrocephalus, mass lesion, acute infarction, or significant intracranial injury. Vascular: No hyperdense vessel or unexpected calcification. Skull: Insert calvarium Sinuses/Orbits: No acute findings Other: None IMPRESSION: Atrophy, chronic microvascular disease. No acute intracranial abnormality. Electronically Signed   By: Charlett Nose M.D.   On: 07/10/2020 20:08    Procedures Procedures   Medications Ordered in ED Medications - No data to display  ED Course  I have reviewed the triage vital signs and the nursing notes.  Pertinent labs & imaging results that were available during my care of the patient were reviewed by me and considered in my medical decision making (see chart for details).    MDM Rules/Calculators/A&P                         Iv  ns. Continuous pulse ox and cardiac monitoring. Stat labs. Imaging.   Reviewed nursing notes and prior charts for additional history. Very recent ED and neurology evaluation for similar symptoms - CTA and neuro recs then to continue current meds, xarelto.   CT reviewed/interpreted by me - no hem.   Labs reviewed/interpreted by me - k normal, hgb c/w prior. Glucose mildly low.  Pt/fam deny any diabetic med use, but state pt hadnt eaten anything since breakfast, and that they were about to eat dinner when came to ED. Po fluids/food provided. Repeat cbg.   Recheck CBG 76/normal.  Pt/family indicate symptoms have remain resolved and pt at baseline. Pt indicates feels ready for d/c.   Rec close pcp f/u.  Return precautions provided.       Final Clinical Impression(s) / ED Diagnoses Final diagnoses:  None    Rx / DC Orders ED Discharge Orders     None          Cathren Laine, MD 07/10/20 2304

## 2020-07-11 ENCOUNTER — Telehealth: Payer: Self-pay | Admitting: *Deleted

## 2020-07-11 NOTE — Telephone Encounter (Signed)
   [facility] Pre-operative Risk Assessment    Patient Name: Lindsey Huber  DOB: 06-Sep-1945  MRN: [id number]   HEARTCARE STAFF: - Please ensure there is not already an duplicate clearance open for this procedure. - Under Visit Info/Reason for Call, type in Other and utilize the format Clearance MM/DD/YY or Clearance TBD. Do not use dashes or single digits. - If request is for dental extraction, please clarify the # of teeth to be extracted. - If the patient is currently at the dentist's office, call Pre-Op APP to address. If the patient is not currently in the dentist office, please route to the Pre-Op pool  Request for surgical clearance:  What type of surgery is being performed? COLONOSCOPY/ENDOSCOPY (ANEMIA, CHANGES IN BOWEL HABITS, WT LOSS ,DYSPHAGIA)   When is this surgery scheduled? TBD   What type of clearance is required (medical clearance vs. Pharmacy clearance to hold med vs. Both)? BOTH  Are there any medications that need to be held prior to surgery and how long? XARELTO x 3 DAYS PER DR. [facility]   Practice name and name of physician performing surgery? [facility]; DR. Therisa Doyne   What is the office phone number? [phone]   7.   What is the office fax number? [phone]  8.   Anesthesia type (None, local, MAC, general) ? PROPOFOL   Julaine Hua 07/11/2020, 5:09 PM  _________________________________________________________________   (provider comments below)

## 2020-07-14 ENCOUNTER — Ambulatory Visit (INDEPENDENT_AMBULATORY_CARE_PROVIDER_SITE_OTHER): Payer: Medicare HMO | Admitting: Neurology

## 2020-07-14 ENCOUNTER — Other Ambulatory Visit: Payer: Self-pay

## 2020-07-14 DIAGNOSIS — R413 Other amnesia: Secondary | ICD-10-CM | POA: Diagnosis not present

## 2020-07-14 DIAGNOSIS — R519 Headache, unspecified: Secondary | ICD-10-CM

## 2020-07-14 DIAGNOSIS — R404 Transient alteration of awareness: Secondary | ICD-10-CM | POA: Diagnosis not present

## 2020-07-14 DIAGNOSIS — R4781 Slurred speech: Secondary | ICD-10-CM | POA: Diagnosis not present

## 2020-07-14 NOTE — Telephone Encounter (Signed)
Pharmacy, can you please comment on how long Xarelto can be held prior to procedure?  Thank you!

## 2020-07-14 NOTE — Procedures (Signed)
ELECTROENCEPHALOGRAM REPORT  Date of Study: 07/14/2020  Patient's Name: Lindsey Huber MRN: [id number] Date of Birth: October 28, 1945  Clinical History: 75 year old female with transient episode of headache, drooling, slurred speech and amnesia.  Medications: TYLENOL 650 MG CR tablet VENTOLIN HFA 108 (90 Base) MCG/ACT inhaler ZYLOPRIM 100 MG tablet AMOXIL 500 MG capsule BREO ELLIPTA 100-25 MCG/INH AEPB OSCAL 1500 (600 Ca) MG TABS tablet ZYRTEC 5 MG/5ML SOLN CALTRATE 600+D PO Cholecalciferol 25 MCG (1000 UT) tablet ENSURE IMMUNE HEALTH LIQD FLONASE 50 MCG/ACT nasal spray TRELEGY ELLIPTA 100-62.5-25 MCG/INH AEPB FOLVITE 800 MCG tablet LASIX 20 MG tablet CORTEF 5 MG tablet PLAQUENIL 200 MG tablet SYNTHROID 25 MCG tablet SYNTHROID 75 MCG tablet TOPROL-XL 25 MG 24 hr tablet LOPRESSOR 25 MG tablet MULTIVITAMIN LIQD OFEV 150 MG CAPS XARELTO 15 MG TABS tablet SENOKOT-S 8.6-50 MG tablet  Technical Summary: A multichannel digital EEG recording measured by the international 10-20 system with electrodes applied with paste and impedances below 5000 ohms performed in our laboratory with EKG monitoring in an awake and asleep patient.  Photic stimulation was performed.  The digital EEG was referentially recorded, reformatted, and digitally filtered in a variety of bipolar and referential montages for optimal display.    Description: The patient is awake and asleep during the recording.  During maximal wakefulness, there is a symmetric, medium voltage 10 Hz posterior dominant rhythm that attenuates with eye opening.  The record is symmetric.  During drowsiness and sleep, there is an increase in theta slowing of the background.  Vertex waves and symmetric sleep spindles were seen.  Photic stimulation did not elicit any abnormalities.  There were no epileptiform discharges or electrographic seizures seen.    EKG lead was unremarkable.  Impression: This awake and asleep EEG is normal.     Clinical Correlation: A normal EEG does not exclude a clinical diagnosis of epilepsy.  If further clinical questions remain, prolonged EEG may be helpful.  Clinical correlation is advised.   Shon Millet, DO

## 2020-07-14 NOTE — Telephone Encounter (Signed)
Patient with diagnosis of atrial fibrillation on Xarelto for anticoagulation.    Procedure: colonoscopy/endoscopy Date of procedure: TBD   CHA2DS2-VASc Score = 6  This indicates a 9.7% annual risk of stroke. The patient's score is based upon: CHF History: Yes HTN History: Yes Diabetes History: No Stroke History: Yes Vascular Disease History: No Age Score: 1 Gender Score: 1    CrCl 63.2 Platelet count 92  Patient has been to ED x 2 in June 2022 for TIA - after first episode (6/7) was seen by neurology to rule out seizure disorder - they are monitoring.  Second occurred 07/10/20.    Note that patient is on Xarelto 15 mg, switched in April from Eliquis due to issues with nose bleeds.  Based on CrCl, she should be on the 20 mg dose of Xarelto to prevent stroke, however may have been given lower dose due to history of chronic anemia and current nosebleeds.  Would recommend increasing dose to 20 mg or going back to Eliquis due to recent TIA activity.    Also, patient has platelet count < 100, therefore need MD comment on clearance.  If procedure cannot be delayed, would recommend that only hold Xarelto x 1 day.  Lovenox is not recommended for platelet counts < 100.   Will defer to primary cardiologist for final decision.

## 2020-07-15 NOTE — Telephone Encounter (Signed)
Personally called and spoke with Dr. Laurey Morale nurse Amy about Dr. Lovena Neighbours recommendation for holding off on colonoscopy for now if not emergent. Patient has had weight loss, dysphagia, and anemia. Will fax Dr. Lovena Neighbours note below to Dr. Laurey Morale office so that she can get back to [place] on what she ultimately recommends.   Also, Amy stated that Dr. Marca Ancona was also wanting Dr. [name] to clear her for procedure given previous problems with anaesthesia. However, patient has never been seen by Dr. [name] and I don't see mention of referring to him in Dr. Lovena Neighbours last office visit note. It sounds like PCP referred patient to Dr. [name]. There was concern about anesthesia with patient's pacemaker and history of COPD. Discussed that the pacemaker should not create a major problem with anesthesia and that it sounds like pulmonology needs to given pre-op risk assessment if the concern is about her severe COPD.   Will fax note to Dr. Marca Ancona office and wait for her recommendation.  Corrin Parker, PA-C 07/15/2020 10:29 AM

## 2020-07-16 NOTE — Progress Notes (Signed)
Patient advised.

## 2020-07-17 MED ORDER — RIVAROXABAN 20 MG PO TABS: 20.0000 mg | ORAL_TABLET | Freq: Every day | ORAL | 11 refills | Status: DC

## 2020-07-17 NOTE — Telephone Encounter (Signed)
Spoke with Pt's daughter.  Advised per Dr. Lalla Brothers should increase her Xarelto to 20 mg one tablet by mouth daily.  Prescription sent to pharmacy.  Daughter indicates understanding.

## 2020-07-22 NOTE — Telephone Encounter (Signed)
Called patient to get clarification on timing of colonoscopy. I spoke with her daughter Lindsey Huber. They have an appt with GI on 08/20/20 to discuss next steps regarding colonoscopy.  Per Dr. Lalla Brothers:  I reviewed her chart. She was previously on lowered dose of apixaban because of bleeding issues and chronic unexplained anemia.   For now, given the recent events that sound like TIA, can we please send in a prescription for xarelto 20mg  PO daily.   If the colonoscopy is not emergent, would recommend holding for now.     I have asked them to clarify urgency and timing of colonoscopy at their July appt. I have also asked them to really to GI if colonoscopy is planned, please reach back out to August for clearance with date of scope.   I will remove this request from the pool.

## 2020-08-13 ENCOUNTER — Other Ambulatory Visit: Payer: Self-pay

## 2020-08-13 ENCOUNTER — Ambulatory Visit (INDEPENDENT_AMBULATORY_CARE_PROVIDER_SITE_OTHER): Payer: Medicare HMO | Admitting: Pulmonary Disease

## 2020-08-13 ENCOUNTER — Telehealth (HOSPITAL_COMMUNITY): Payer: Self-pay | Admitting: Cardiology

## 2020-08-13 ENCOUNTER — Encounter: Payer: Self-pay | Admitting: Pulmonary Disease

## 2020-08-13 VITALS — BP 146/80 | HR 86 | Ht 62.0 in | Wt 115.6 lb

## 2020-08-13 DIAGNOSIS — J4540 Moderate persistent asthma, uncomplicated: Secondary | ICD-10-CM

## 2020-08-13 DIAGNOSIS — J84112 Idiopathic pulmonary fibrosis: Secondary | ICD-10-CM | POA: Diagnosis not present

## 2020-08-13 DIAGNOSIS — R0600 Dyspnea, unspecified: Secondary | ICD-10-CM

## 2020-08-13 DIAGNOSIS — R0609 Other forms of dyspnea: Secondary | ICD-10-CM

## 2020-08-13 MED ORDER — BREZTRI AEROSPHERE 160-9-4.8 MCG/ACT IN AERO: 2.0000 | INHALATION_SPRAY | Freq: Two times a day (BID) | RESPIRATORY_TRACT | 11 refills | Status: DC

## 2020-08-13 MED ORDER — BREZTRI AEROSPHERE 160-9-4.8 MCG/ACT IN AERO: 2.0000 | INHALATION_SPRAY | Freq: Two times a day (BID) | RESPIRATORY_TRACT | 0 refills | Status: DC

## 2020-08-13 NOTE — Telephone Encounter (Signed)
[facility] called to f/u w/referral faxed on 06/07, please advise

## 2020-08-13 NOTE — Patient Instructions (Addendum)
Nice to see you again  I worried that some your symptoms are related to poorly controlled asthma given the cough, wheeze.  Stop Trelegy.  Use Breztri 2 puffs twice a day.  Please rinse your mouth out after every use.  Similar to Trelegy, Lindsey Huber as 3 medicines to help reduce inflammation and open up your airways.  I am hopeful this will improve your breathing.  If despite this change or still having difficulty breathing, we will need to consider repeating breathing tests in the future.  Continue taking the Ofev as prescribed.  Return to clinic in 3 months for follow-up with [name] or sooner as needed

## 2020-08-18 ENCOUNTER — Ambulatory Visit: Payer: Medicare HMO | Admitting: Internal Medicine

## 2020-08-19 ENCOUNTER — Ambulatory Visit (INDEPENDENT_AMBULATORY_CARE_PROVIDER_SITE_OTHER): Payer: Medicare HMO

## 2020-08-19 DIAGNOSIS — I429 Cardiomyopathy, unspecified: Secondary | ICD-10-CM

## 2020-08-19 NOTE — Progress Notes (Signed)
  ID: Lindsey Huber, female    DOB: 10/12/1945, 75 y.o.   MRN: [id number]  Chief Complaint  Patient presents with   Follow-up    She reports short of breath on Exertion x 1-2 months,. Laying down makes her feel better.     Referring provider: Collene Mares, PA  HPI:   75 year old woman whom I am seeing in follow up and ongoing care of multiple pulmonary issues.  ED notes 07/01/20 and 07/10/20 reviewed.  Overall doing ok. A bit more wheezy, cough a little worse last few weeks. Increased nasal congestion, post nasal drip symptoms. Seen in ED for word finding difficulties. Imaging normal. Glucose low. Symptoms improved after food.  Recent EEG outpatient EEG reviewed - normal.   HPI at initial visit: Patient recently moved to the area from [place].  Had multiple specialist including cardiology, pulmonary.  Chronic dyspnea for some time.  Diagnosed with RA related ILD.  Started on Ofev in the past.  Continues this.  Occasional diarrhea.  Dyspnea worse on inclines or stairs.  Present on flat surfaces over short distances as well.  No time of day when things are better or worse.  Dyspnea constant.  No seasonal environmental factors she can identify that make this better or worse.  No position make things better or worse.  No clear alleviating exacerbating factors.  She continues on inhalers.  Currently Trelegy.  PMH: ILD, valvular issues, heart issues, Surgical history: Heart valve surgery, pacemaker placement, tonsillectomy Family history: Mother had CAD, hypertension Father with CAD, hypertension Social history: Never smoker, recently moved to the area, lives in [place] / Pulmonary Flowsheets:   ACT:  No flowsheet data found.  MMRC: No flowsheet data found.  Epworth:  No flowsheet data found.  Tests:   FENO:  No results found for: NITRICOXIDE  PFT: No flowsheet data found.  WALK:  No flowsheet data found.  Imaging: None reviewed,  requested  Lab Results:  CBC Personally reviewed most recent labs from PCP with minimal records sent    Component Value Date/Time   WBC 4.0 07/10/2020 1930   RBC 2.56 (L) 07/10/2020 1930   HGB 8.3 (L) 07/10/2020 1930   HCT 25.8 (L) 07/10/2020 1930   PLT 92 (L) 07/10/2020 1930   MCV 100.8 (H) 07/10/2020 1930   MCH 32.4 07/10/2020 1930   MCHC 32.2 07/10/2020 1930   RDW 16.3 (H) 07/10/2020 1930   LYMPHSABS 1.3 07/01/2020 2006   MONOABS 0.6 07/01/2020 2006   EOSABS 0.1 07/01/2020 2006   BASOSABS 0.0 07/01/2020 2006    BMET    Component Value Date/Time   NA 139 07/10/2020 1930   K 3.5 07/10/2020 1930   CL 112 (H) 07/10/2020 1930   CO2 21 (L) 07/10/2020 1930   GLUCOSE 66 (L) 07/10/2020 1930   BUN 11 07/10/2020 1930   CREATININE 0.67 07/10/2020 1930   CALCIUM 7.6 (L) 07/10/2020 1930   GFRNONAA >60 07/10/2020 1930    BNP    Component Value Date/Time   BNP 567.7 (H) 05/12/2020 1754    ProBNP No results found for: PROBNP  Specialty Problems       Pulmonary Problems   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HCC)    Allergies  Allergen Reactions   Propofol Other (See Comments)    Other reaction(s): asthma, Unknown/Patient and Family Unable to Define   Flecainide Other (See Comments)    Other reaction(s): unknown   Amiodarone Other (See Comments)    Other reaction(s):  Delerium/Confusion/Psychosis, unknown   Amlodipine Other (See Comments)    Other reaction(s): unknown Tired, syncope    Immunization History  Administered Date(s) Administered   Influenza-Unspecified 10/26/2019   PFIZER(Purple Top)SARS-COV-2 Vaccination 04/17/2019, 05/08/2019, 11/24/2019    Past Medical History:  Diagnosis Date   A-fib (HCC)    Anemia    Cardiomyopathy (HCC)    CHF (congestive heart failure) (HCC)    CKD (chronic kidney disease)    Diverticulosis    Hypertension    Lupus (HCC)    Osteoarthritis    Pulmonary fibrosis (HCC)    Vitamin D deficiency     Tobacco  History: Social History   Tobacco Use  Smoking Status Never  Smokeless Tobacco Never   Counseling given: Not Answered   Continue to not smoke  Outpatient Encounter Medications as of 08/13/2020  Medication Sig   albuterol (VENTOLIN HFA) 108 (90 Base) MCG/ACT inhaler Inhale 2 puffs into the lungs every 6 (six) hours as needed for wheezing or shortness of breath.   allopurinol (ZYLOPRIM) 100 MG tablet Take 1 tablet (100 mg total) by mouth daily.   amoxicillin (AMOXIL) 500 MG capsule Take 500 mg by mouth 2 (two) times daily as needed (Dental procedures).   Budeson-Glycopyrrol-Formoterol (BREZTRI AEROSPHERE) 160-9-4.8 MCG/ACT AERO Inhale 2 puffs into the lungs in the morning and at bedtime.   Budeson-Glycopyrrol-Formoterol (BREZTRI AEROSPHERE) 160-9-4.8 MCG/ACT AERO Inhale 2 puffs into the lungs in the morning and at bedtime.   Calcium Carbonate-Vitamin D (CALTRATE 600+D PO) Take 1 capsule by mouth daily.   cetirizine HCl (ZYRTEC) 5 MG/5ML SOLN Take 5 mg by mouth daily.   Cholecalciferol 25 MCG (1000 UT) tablet Take 1 tablet by mouth daily.   feeding supplement (ENSURE IMMUNE HEALTH) LIQD Take 237 mLs by mouth 3 (three) times daily with meals.   fluticasone (FLONASE) 50 MCG/ACT nasal spray Place 2 sprays into both nostrils daily as needed for allergies.   folic acid (FOLVITE) 800 MCG tablet Take 400 mcg by mouth daily.   furosemide (LASIX) 20 MG tablet Take 20 mg by mouth daily.   hydrocortisone (CORTEF) 5 MG tablet Take 5 mg by mouth 2 (two) times daily.   hydroxychloroquine (PLAQUENIL) 200 MG tablet Take 1 tablet (200 mg total) by mouth daily.   levothyroxine (SYNTHROID) 25 MCG tablet Take 25 mcg by mouth daily.   metoprolol succinate (TOPROL-XL) 25 MG 24 hr tablet Take 1 tablet by mouth daily. Hold dose if <110 SBP or <60 DBP   Multiple Vitamin (MULTIVITAMIN) LIQD Take 5 mLs by mouth daily.   Nintedanib (OFEV) 150 MG CAPS Take 150 mg by mouth 2 (two) times daily.   rivaroxaban (XARELTO)  20 MG TABS tablet Take 1 tablet (20 mg total) by mouth daily with supper.   senna-docusate (SENOKOT-S) 8.6-50 MG tablet Take 2 tablets by mouth as needed for irritation.   [DISCONTINUED] Fluticasone-Umeclidin-Vilant (TRELEGY ELLIPTA) 100-62.5-25 MCG/INH AEPB Inhale 1 puff into the lungs daily.   [DISCONTINUED] acetaminophen (TYLENOL) 650 MG CR tablet Take 650 mg by mouth every 8 (eight) hours as needed for pain. (Patient not taking: Reported on 08/13/2020)   [DISCONTINUED] BREO ELLIPTA 100-25 MCG/INH AEPB Inhale 1 puff into the lungs daily.   No facility-administered encounter medications on file as of 08/13/2020.     Review of Systems  Review of Systems  N/a Physical Exam  BP (!) 146/80 (BP Location: Right Arm, Patient Position: Sitting, Cuff Size: Normal)   Pulse 86   Ht 5\' 2"  (1.575 m)  Wt 115 lb 9.6 oz (52.4 kg)   SpO2 100%   BMI 21.14 kg/m   Wt Readings from Last 5 Encounters:  08/13/20 115 lb 9.6 oz (52.4 kg)  07/10/20 118 lb (53.5 kg)  07/03/20 118 lb (53.5 kg)  07/01/20 118 lb (53.5 kg)  06/27/20 118 lb (53.5 kg)    BMI Readings from Last 5 Encounters:  08/13/20 21.14 kg/m  07/10/20 21.58 kg/m  07/03/20 21.58 kg/m  07/01/20 21.24 kg/m  06/27/20 21.24 kg/m     Physical Exam General: Frail, sitting in chair Eyes: EOMI, no icterus Neck: Supple, no JVP Cardiovascular: Significant 3/6 late systolic murmur heard best right upper sternal border, regular rate Pulmonary: Crackles in bilateral bases, otherwise clear to auscultation bilaterally Abdomen: Nondistended, bowel sounds present MSK: No synovitis, joint effusion Neuro: Ambulates with assistance of walker, otherwise no weakness Psych: Normal mood, full affect   Assessment & Plan:   ILD: 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  To continue this.  Consider repeat CT scan in future if symptoms worsen.  Dyspnea exertion: Related to ILD, pulmonary hypertension, asthma.  On therapy for ILD as above.   Worsened DOE with increased atopic symptoms. See asthma below for intervention. Consider PFTs in the future if symptoms fail to improve or worsen.  Asthma: Increase DOE, wheeze, atopic symptoms last several weeks. Stop Trelegy, start Breztri as suspect HFA may be more effective with advanced lung disease.   Pulmonary hypertension: Presumed based on prior TTE.  Never on therapy.  Denies right heart cath.  Reportedly significant valvular disease, like combination  of group 2 and group 3.  She is poor candidate for therapy based on her age, frailty.  Do not see a reason to pursue right heart cath at this time but can reconsider in the future.    Return in about 3 months (around 11/13/2020).   Karren Burly, MD 08/19/2020

## 2020-08-20 ENCOUNTER — Ambulatory Visit (INDEPENDENT_AMBULATORY_CARE_PROVIDER_SITE_OTHER): Payer: Medicare HMO | Admitting: Internal Medicine

## 2020-08-20 ENCOUNTER — Other Ambulatory Visit: Payer: Self-pay

## 2020-08-20 ENCOUNTER — Ambulatory Visit: Payer: Medicare HMO | Admitting: Internal Medicine

## 2020-08-20 ENCOUNTER — Encounter: Payer: Self-pay | Admitting: Internal Medicine

## 2020-08-20 VITALS — BP 142/84 | HR 84 | Ht 62.0 in | Wt 112.4 lb

## 2020-08-20 DIAGNOSIS — E042 Nontoxic multinodular goiter: Secondary | ICD-10-CM

## 2020-08-20 DIAGNOSIS — M1A09X0 Idiopathic chronic gout, multiple sites, without tophus (tophi): Secondary | ICD-10-CM | POA: Diagnosis not present

## 2020-08-20 DIAGNOSIS — E2749 Other adrenocortical insufficiency: Secondary | ICD-10-CM

## 2020-08-20 DIAGNOSIS — E039 Hypothyroidism, unspecified: Secondary | ICD-10-CM

## 2020-08-20 DIAGNOSIS — M329 Systemic lupus erythematosus, unspecified: Secondary | ICD-10-CM

## 2020-08-20 HISTORY — DX: Nontoxic multinodular goiter: E04.2

## 2020-08-20 HISTORY — DX: Hypothyroidism, unspecified: E03.9

## 2020-08-20 LAB — BASIC METABOLIC PANEL
BUN: 20 mg/dL (ref 6–23)
CO2: 24 mEq/L (ref 19–32)
Calcium: 9.4 mg/dL (ref 8.4–10.5)
Chloride: 105 mEq/L (ref 96–112)
Creatinine, Ser: 0.98 mg/dL (ref 0.40–1.20)
GFR: 57.36 mL/min — ABNORMAL LOW (ref >60.00)
Glucose, Bld: 69 mg/dL — ABNORMAL LOW (ref 70–99)
Potassium: 3.6 mEq/L (ref 3.5–5.1)
Sodium: 138 mEq/L (ref 135–145)

## 2020-08-20 LAB — TSH: TSH: 1.67 u[IU]/mL (ref 0.35–5.50)

## 2020-08-20 LAB — CORTISOL: Cortisol, Plasma: 3.7 ug/dL

## 2020-08-20 MED ORDER — ALLOPURINOL 100 MG PO TABS: 100.0000 mg | ORAL_TABLET | Freq: Every day | ORAL | 0 refills | Status: DC

## 2020-08-20 MED ORDER — HYDROXYCHLOROQUINE SULFATE 200 MG PO TABS: 200.0000 mg | ORAL_TABLET | Freq: Every day | ORAL | 0 refills | Status: DC

## 2020-08-20 MED ORDER — PREDNISONE 5 MG PO TABS: 5.0000 mg | ORAL_TABLET | Freq: Every day | ORAL | 3 refills | Status: DC

## 2020-08-20 NOTE — Progress Notes (Signed)
Name: Lindsey Huber  MRN/ DOB: [id number], [birth year]    Age/ Sex: 75 y.o., female    PCP: Collene Mares, [name]   Reason for Endocrinology Evaluation: MNG, adrenal insufficiency      Date of Initial Endocrinology Evaluation: 08/20/2020     HPI: [name] is a 75 y.o. female with a past medical history of MNG, Gout, Adrenal Insufficiency, CHF and RA. The patient presented for initial endocrinology clinic visit on 08/20/2020 for consultative assistance with her MNG/ adrenal Insufficiency .     She is accompanied by her Daughter Marcelino Duster , moved from [place]    MNG History:  Has been diagnosed with MNG years ago. She is not sure of a thyroid biopsies in the past . Has been on LT-4 replacement daily   She takes it appropriately in the morning  She denies any local neck swelling, or pain  Denies prior exposure to radiation   No Fh of thyroid disease   Adrenal History :  She was started on [facility] in 02/2020 following an admission for septic shock . Prior to that she was on Prednisone for years , she was on this though rheumatology for RA   RA is stable  Fatigue fluctuates  Has occasional dizziness  Rare nausea Alternating bowel movement  She has been losing weight    HOME ENDOCRINE MEDICATIONS : HC 5 mg  BID  Levothyroxine 25 mcg daily     HISTORY:  Past Medical History:  Past Medical History:  Diagnosis Date   A-fib (HCC)    Anemia    Cardiomyopathy (HCC)    CHF (congestive heart failure) (HCC)    CKD (chronic kidney disease)    Diverticulosis    Hypertension    Lupus (HCC)    Osteoarthritis    Pulmonary fibrosis (HCC)    Vitamin D deficiency    Past Surgical History:  Past Surgical History:  Procedure Laterality Date   CARDIAC VALVE SURGERY     CARPAL TUNNEL RELEASE     HEMORRHOID SURGERY     PACEMAKER INSERTION     TONSILLECTOMY      Social History:  reports that she has never smoked. She has never used smokeless tobacco. She reports that  she does not drink alcohol and does not use drugs. Family History: family history includes Cancer in her brother; Diabetes in her brother; Heart disease in her father, mother, and sister; Hypertension in her father and mother; Osteoarthritis in her father and mother; Rheum arthritis in her mother.   HOME MEDICATIONS: Allergies as of 08/20/2020       Reactions   Propofol Other (See Comments)   Other reaction(s): asthma, Unknown/Patient and Family Unable to Define   Flecainide Other (See Comments)   Other reaction(s): unknown   Amiodarone Other (See Comments)   Other reaction(s): Delerium/Confusion/Psychosis, unknown   Amlodipine Other (See Comments)   Other reaction(s): unknown Tired, syncope        Medication List        Accurate as of August 20, 2020  7:08 AM. If you have any questions, ask your nurse or doctor.          albuterol 108 (90 Base) MCG/ACT inhaler Commonly known as: VENTOLIN HFA Inhale 2 puffs into the lungs every 6 (six) hours as needed for wheezing or shortness of breath.   allopurinol 100 MG tablet Commonly known as: ZYLOPRIM Take 1 tablet (100 mg total) by mouth daily.   amoxicillin 500 MG  capsule Commonly known as: AMOXIL Take 500 mg by mouth 2 (two) times daily as needed (Dental procedures).   Breztri Aerosphere 160-9-4.8 MCG/ACT Aero Generic drug: Budeson-Glycopyrrol-Formoterol Inhale 2 puffs into the lungs in the morning and at bedtime.   Breztri Aerosphere 160-9-4.8 MCG/ACT Aero Generic drug: Budeson-Glycopyrrol-Formoterol Inhale 2 puffs into the lungs in the morning and at bedtime.   CALTRATE 600+D PO Take 1 capsule by mouth daily.   cetirizine HCl 5 MG/5ML Soln Commonly known as: Zyrtec Take 5 mg by mouth daily.   Cholecalciferol 25 MCG (1000 UT) tablet Take 1 tablet by mouth daily.   feeding supplement Liqd Take 237 mLs by mouth 3 (three) times daily with meals.   fluticasone 50 MCG/ACT nasal spray Commonly known as:  FLONASE Place 2 sprays into both nostrils daily as needed for allergies.   folic acid 800 MCG tablet Commonly known as: FOLVITE Take 400 mcg by mouth daily.   furosemide 20 MG tablet Commonly known as: LASIX Take 20 mg by mouth daily.   hydrocortisone 5 MG tablet Commonly known as: CORTEF Take 5 mg by mouth 2 (two) times daily.   hydroxychloroquine 200 MG tablet Commonly known as: PLAQUENIL Take 1 tablet (200 mg total) by mouth daily.   levothyroxine 25 MCG tablet Commonly known as: SYNTHROID Take 25 mcg by mouth daily.   metoprolol succinate 25 MG 24 hr tablet Commonly known as: TOPROL-XL Take 1 tablet by mouth daily. Hold dose if <110 SBP or <60 DBP   multivitamin Liqd Take 5 mLs by mouth daily.   Ofev 150 MG Caps Generic drug: Nintedanib Take 150 mg by mouth 2 (two) times daily.   rivaroxaban 20 MG Tabs tablet Commonly known as: XARELTO Take 1 tablet (20 mg total) by mouth daily with supper.   senna-docusate 8.6-50 MG tablet Commonly known as: Senokot-S Take 2 tablets by mouth as needed for irritation.          REVIEW OF SYSTEMS: A comprehensive ROS was conducted with the patient and is negative except as per HPI    OBJECTIVE:  VS: BP (!) 142/84 (BP Location: Left Arm, Patient Position: Sitting, Cuff Size: Normal)   Pulse 84   Ht 5\' 2"  (1.575 m)   Wt 112 lb 6.4 oz (51 kg)   SpO2 99%   BMI 20.56 kg/m    Wt Readings from Last 3 Encounters:  08/13/20 115 lb 9.6 oz (52.4 kg)  07/10/20 118 lb (53.5 kg)  07/03/20 118 lb (53.5 kg)     EXAM: General: Pt appears well and is in NAD  Neck: General: Supple without adenopathy. Thyroid: Thyroid size normal.  Right thyroid asymmetry appreciated.   Lungs: Clear with good BS bilat with no rales, rhonchi, or wheezes  Heart: Auscultation: RRR.  Abdomen: Normoactive bowel sounds, soft, nontender, without masses or organomegaly palpable  Extremities:  BL LE: No pretibial edema normal ROM and strength.   Mental Status: Judgment, insight: Intact Orientation: Oriented to time, place, and person Mood and affect: No depression, anxiety, or agitation     DATA REVIEWED: Results for LESLIEANNE, COBARRUBIAS (MRN [name]) as of 08/22/2020 09:28  Ref. Range 08/20/2020 09:19  Sodium Latest Ref Range: 135 - 145 mEq/L 138  Potassium Latest Ref Range: 3.5 - 5.1 mEq/L 3.6  Chloride Latest Ref Range: 96 - 112 mEq/L 105  CO2 Latest Ref Range: 19 - 32 mEq/L 24  Glucose Latest Ref Range: 70 - 99 mg/dL 69 (L)  BUN Latest Ref Range: 6 -  23 mg/dL 20  Creatinine Latest Ref Range: 0.40 - 1.20 mg/dL 4.09  Calcium Latest Ref Range: 8.4 - 10.5 mg/dL 9.4  GFR Latest Ref Range: >60.00 mL/min 57.36 (L)  Cortisol, Plasma Latest Units: ug/dL 3.7  TSH Latest Ref Range: 0.35 - 5.50 uIU/mL 1.67      ASSESSMENT/PLAN/RECOMMENDATIONS:   Multinodular Goiter :  -Records not available -And known history of biopsies in the past -We will proceed with thyroid ultrasound    2. Hypothyroidism:  -Patient with nonspecific symptoms - Pt educated extensively on the correct way to take levothyroxine (first thing in the morning with water, 30 minutes before eating or taking other medications). - Pt encouraged to double dose the following day if she were to miss a dose given long half-life of levothyroxine.  Medication Continue levothyroxine 25 MCG daily   3.  Secondary adrenal insufficiency:   -This is due to chronic use of glucocorticoids for rheumatoid arthritis.  She was on prednisone for years which was switched to hydrocortisone since her hospitalization in [place] in 02/2020 -She is on subphysiologic doses of hydrocortisone which could explain some of her symptoms -Since this is a chronic issue I am going to switch hydrocortisone back to prednisone for ease of take -I have advised the patient to obtain a medical alert bracelet -We discussed sick day will   Medication Stop hydrocortisone Start prednisone 5 mg  daily with breakfast    Follow-up in 6 months  Signed electronically by: Lyndle Herrlich, MD  [facility]  [facility] [address] [place] [place], [place] [address] Phone: [phone] FAX: [phone]   CC: Collene Mares, [name] [address] [place] [address] Phone: [phone] Fax: [phone]   Return to Endocrinology clinic as below: Future Appointments  Date Time Provider Department Center  08/20/2020  8:30 AM Baylee Mccorkel, Konrad Dolores, MD [facility] None  08/20/2020 11:00 AM Fuller Plan, MD [facility] None  08/22/2020  2:30 PM [facility] 6 MINUTE WALK [facility] None  10/27/2020  2:20 PM Bensimhon, Bevelyn Buckles, MD [facility] None  11/18/2020  7:00 AM [facility] DEVICE REMOTES [facility]  01/21/2021  3:30 PM Shon Millet R, DO [facility] None  02/17/2021  7:00 AM [facility] DEVICE REMOTES [facility]  05/19/2021  7:00 AM [facility] DEVICE REMOTES [facility]  08/18/2021  7:00 AM [facility] DEVICE REMOTES [facility]  11/17/2021  7:00 AM [facility] DEVICE REMOTES [facility]

## 2020-08-20 NOTE — Progress Notes (Addendum)
Office Visit Note  Patient: Lindsey Huber             Date of Birth: Apr 07, 1945           MRN: [id number]             PCP: Collene Mares, PA Referring: Collene Mares, [name] Visit Date: 08/20/2020   Subjective:  Follow-up (Patient is currently taking PLQ 200 mg daily and Allopurinol 100 mg daily and feels as if most days symptoms are well controlled. Patient is scheduled for PLQ eye exam in August. Patient was seen by endocrinology earlier today and was started on Prednisone 5 mg daily. )   History of Present Illness: Lindsey Huber is a 75 y.o. female here for follow up for SLE with inflammatory arthritis and gout currently on HCQ 200 mg and allopurinol 100 mg.  She denies any significant flareup of symptoms since her last visit. She remains on Ofev 150 mg BID for ILD. She has ongoing problems with her left knee sometimes increased pain and stiffness in this joint and feeling that it gives out on her.  She reports a fall in April of this year after her left knee gave out on her also slipping in May according to our last visit.  She uses a rolling walker for stability and sometimes uses a soft knee brace. She worked with physical therapy for gait earlier this year. She saw endocrinology clinic this morning and is now restarting steroids at 5 mg prednisone daily for chronic adrenal insufficiency most likely from long-term use of steroids for rheumatology.  Previous HPI: 06/27/20 [name] is a 75 y.o. female here to establish care for systemic lupus and gouty arthritis currently on hydroxychloroquine 200 mg p.o. daily and allopurinol 100 mg p.o. daily.  She was originally diagnosed due to symptoms of multiple joint pains with orthopedic surgery evaluation in [place] with additional work-up revealing for systemic lupus.  She has previously taken steroids and was on low-dose prednisone 5 mg daily for quite some time although discontinued at her most recent hospitalization.  She  has had episodic gout flares involving both feet.  Lupus symptoms have been predominantly arthritis possibly a overlap with rheumatoid arthritis with no known history of lupus nephritis.  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  She has also had intra-articular steroid injection of the knees for arthritis symptoms with reasonably good benefit.  Her left knee has had some frequent slipping or instability reported she describes a fall last week despite routine use of a walker and sometimes knee brace for this.  Review of Systems  Constitutional:  Negative for fatigue.  HENT:  Negative for mouth sores, mouth dryness and nose dryness.   Eyes:  Positive for pain. Negative for itching, visual disturbance and dryness.  Respiratory:  Positive for shortness of breath. Negative for cough, hemoptysis and difficulty breathing.   Cardiovascular:  Positive for swelling in legs/feet. Negative for chest pain and palpitations.  Gastrointestinal:  Positive for constipation and diarrhea. Negative for abdominal pain and blood in stool.  Endocrine: Negative for increased urination.  Genitourinary:  Negative for painful urination.  Musculoskeletal:  Positive for joint pain, joint pain and morning stiffness. Negative for joint swelling, myalgias, muscle weakness, muscle tenderness and myalgias.  Skin:  Negative for color change, rash and redness.  Allergic/Immunologic: Negative for susceptible to infections.  Neurological:  Negative for dizziness, numbness, headaches, memory loss and weakness.  Hematological:  Negative for swollen glands.  Psychiatric/Behavioral:  Positive for sleep disturbance. Negative for confusion.    PMFS History:  Patient Active Problem List   Diagnosis Date Noted   Multinodular goiter 08/20/2020   Acquired hypothyroidism 08/20/2020   Idiopathic gout of multiple sites 06/27/2020   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   Vitamin D deficiency  05/01/2020   Mammogram abnormal 05/01/2020   Proteinuria 05/01/2020   Other bilateral secondary osteoarthritis of knee 05/01/2020   Osteoarthritis of hip 05/01/2020   HTN (hypertension) 05/01/2020   GERD without esophagitis 05/01/2020   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HCC) 05/01/2020   Cardiomyopathy (HCC) 05/01/2020   Iatrogenic hypotension 05/01/2020   Hypercoagulability due to atrial fibrillation (HCC) 05/01/2020   Systemic lupus erythematosus (HCC) 05/01/2020   Chronic heart failure with preserved ejection fraction (HCC) 05/01/2020   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   Paroxysmal atrial fibrillation with RVR (HCC) 05/01/2020   Protein calorie malnutrition (HCC) 05/01/2020   Chronic kidney disease (CKD), active medical management without dialysis, stage 3 (moderate) (HCC) 05/01/2020   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HCC) 05/01/2020   Non-rheumatic atrial fibrillation (HCC) 05/01/2020   Atherosclerosis of abdominal aorta (HCC) 05/01/2020   Diverticulosis of colon 05/01/2020   Cholelithiasis 05/01/2020   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   Anemia of chronic disease 05/01/2020    Past Medical History:  Diagnosis Date   A-fib (HCC)    Anemia    Cardiomyopathy (HCC)    CHF (congestive heart failure) (HCC)    CKD (chronic kidney disease)    Diverticulosis    Hypertension    Lupus (HCC)    Osteoarthritis    Pulmonary fibrosis (HCC)    Vitamin D deficiency     Family History  Problem Relation Age of Onset   Heart disease Mother    Hypertension Mother    Rheum arthritis Mother    Osteoarthritis Mother    Heart disease Father    Hypertension Father    Osteoarthritis Father    Heart disease Sister    Diabetes Brother    Cancer Brother    Past Surgical History:  Procedure Laterality Date   CARDIAC VALVE SURGERY     CARPAL TUNNEL RELEASE     HEMORRHOID SURGERY     PACEMAKER INSERTION      TONSILLECTOMY     Social History   Social History Narrative   Not on file   Immunization History  Administered Date(s) Administered   Influenza-Unspecified 10/26/2019   PFIZER(Purple Top)SARS-COV-2 Vaccination 04/17/2019, 05/08/2019, 11/24/2019     Objective: Vital Signs: BP (!) 141/77 (BP Location: Right Arm, Patient Position: Sitting, Cuff Size: Normal)   Pulse 73   Ht 5\' 2"  (1.575 m)   Wt 114 lb (51.7 kg)   BMI 20.85 kg/m    Physical Exam HENT:     Right Ear: External ear normal.     Left Ear: External ear normal.  Pulmonary:     Effort: Pulmonary effort is normal.  Skin:    General: Skin is dry.     Comments: 1+ edema over shins bilaterally, hyperpigmented skin changes in patches anteriorly  Neurological:     Mental Status: She is alert.  Psychiatric:        Mood and Affect: Mood normal.     Musculoskeletal Exam:  Neck full ROM no tenderness Shoulders full ROM no tenderness or swelling Elbows full ROM no tenderness or swelling Wrists decreased flexion and extension ROM with crepitus  R>L no swelling or tenderness Fingers full ROM, squaring of 1st CMC b/l with MCP hyperextension, chronic MCP enlargement Knees full ROM R normal left medial joint line tenderness to pressure and patellofemoral crepitus Ankles full ROM no tenderness or swelling   Investigation: No additional findings.  Imaging: No results found.  Recent Labs: Lab Results  Component Value Date   WBC 4.0 07/10/2020   HGB 8.3 (L) 07/10/2020   PLT 92 (L) 07/10/2020   NA 139 07/10/2020   K 3.5 07/10/2020   CL 112 (H) 07/10/2020   CO2 21 (L) 07/10/2020   GLUCOSE 66 (L) 07/10/2020   BUN 11 07/10/2020   CREATININE 0.67 07/10/2020   BILITOT 0.7 07/01/2020   ALKPHOS 69 07/01/2020   AST 56 (H) 07/01/2020   ALT 41 07/01/2020   PROT 7.4 07/01/2020   ALBUMIN 3.2 (L) 07/01/2020   CALCIUM 7.6 (L) 07/10/2020    Speciality Comments: No specialty comments available.  Procedures:  No procedures  performed Allergies: Propofol, Flecainide, Amiodarone, and Amlodipine   Assessment / Plan:     Visit Diagnoses: Systemic lupus erythematosus, unspecified SLE type, unspecified organ involvement status (HCC) - Plan: hydroxychloroquine (PLAQUENIL) 200 MG tablet  Inflammatory arthritis appears well controlled no synovitis present on exam today.  Left knee problem sounds more consistent with degenerative arthritis also suspicious for degenerative meniscus damage with the worsening joint stability and giving way.  Patient and her daughter provide somewhat confused report whether she is taking the hydroxychloroquine once or twice daily.  I reviewed with them the correct dosing is once daily based on her body mass and updated prescription to be sure it reflects this.  He is resuming low-dose steroids for endocrine purposes this would make any symptom exacerbation if this is a dose reduction very unlikely.  She is scheduled for ophthalmology exam later this year.  Idiopathic chronic gout of multiple sites without tophus - Plan: allopurinol (ZYLOPRIM) 100 MG tablet  Uric acid previously at goal on current allopurinol 100 mg daily she denies any gout flare there is no inflammatory arthritis present on exam today.  Plan to continue allopurinol at current 100 mg dose.  Orders: No orders of the defined types were placed in this encounter.  Meds ordered this encounter  Medications   hydroxychloroquine (PLAQUENIL) 200 MG tablet    Sig: Take 1 tablet (200 mg total) by mouth daily.    Dispense:  90 tablet    Refill:  0   allopurinol (ZYLOPRIM) 100 MG tablet    Sig: Take 1 tablet (100 mg total) by mouth daily.    Dispense:  90 tablet    Refill:  0      Follow-Up Instructions: Return in about 3 months (around 11/20/2020) for RA on HCQ dose reduction f/u 52mos.   Fuller Plan, MD  Note - This record has been created using [employer].  Chart creation errors have been sought, but may not  always  have been located. Such creation errors do not reflect on  the standard of medical care.

## 2020-08-20 NOTE — Patient Instructions (Signed)
-  STOP Hydrocortisone - START Prednisone 5 mg 1 tablet EVERY morning with Breakfast  - Continue Levothyroxine 25 mcg daily   ADRENAL INSUFFICIENCY SICK DAY RULES:  Should you face an extreme emotional or physical stress such as trauma, surgery or acute illness, this will require extra steroid coverage so that the body can meet that stress.   Without increasing the steroid dose you may experience severe weakness, headache, dizziness, nausea and vomiting and possibly a more serious deterioration in health.  Typically the dose of steroids will only need to be increased for a couple of days if you have an illness that is transient and managed in the community.   If you are unable to take/absorb an increased dose of steroids orally because of vomiting or diarrhea, you will urgently require steroid injections and should present to an Emergency Department.  The general advice for any serious illness is as follows: Double the normal daily steroid dose for up to 3 days if you have a temperature of more than 37.50C (99.50F) with signs of sickness, or severe emotional or physical distress Contact your primary care doctor and Endocrinologist if the illness worsens or it lasts for more than 3 days.  In cases of severe illness, urgent medical assistance should be promptly sought. If you experience vomiting/diarrhea or are unable to take steroids by mouth, please administer the Hydrocortisone injection kit and seek urgent medical help.  

## 2020-08-21 ENCOUNTER — Telehealth: Payer: Self-pay

## 2020-08-21 NOTE — Telephone Encounter (Signed)
Patient called to let us know that her monitor is being replaced and it will take 7-10 business days and she will call us once she receives it

## 2020-08-22 ENCOUNTER — Ambulatory Visit (INDEPENDENT_AMBULATORY_CARE_PROVIDER_SITE_OTHER): Payer: Medicare HMO

## 2020-08-22 ENCOUNTER — Other Ambulatory Visit: Payer: Self-pay

## 2020-08-22 DIAGNOSIS — J84112 Idiopathic pulmonary fibrosis: Secondary | ICD-10-CM | POA: Diagnosis not present

## 2020-08-22 LAB — CUP PACEART REMOTE DEVICE CHECK
Battery Remaining Longevity: 83 mo
Battery Voltage: 2.94 V
Brady Statistic RV Percent Paced: 99.68 %
Date Time Interrogation Session: 20220728222705
HighPow Impedance: 45 Ohm
Implantable Lead Implant Date: 20120920
Implantable Lead Location: 753862
Implantable Pulse Generator Implant Date: 20190809
Lead Channel Impedance Value: 247 Ohm
Lead Channel Impedance Value: 304 Ohm
Lead Channel Pacing Threshold Amplitude: 0.875 V
Lead Channel Pacing Threshold Pulse Width: 0.4 ms
Lead Channel Sensing Intrinsic Amplitude: 4.875 mV
Lead Channel Sensing Intrinsic Amplitude: 4.875 mV
Lead Channel Setting Pacing Amplitude: 1.75 V
Lead Channel Setting Pacing Pulse Width: 0.4 ms
Lead Channel Setting Sensing Sensitivity: 0.3 mV

## 2020-08-22 MED ORDER — LEVOTHYROXINE SODIUM 25 MCG PO TABS: 25.0000 ug | ORAL_TABLET | Freq: Every day | ORAL | 3 refills | Status: DC

## 2020-08-22 NOTE — Progress Notes (Signed)
Six Minute Walk - 08/22/20 1452       Six Minute Walk   Medications taken before test (dose and time) levothyroxine at 6am, allopurinol 100mg , hydroxychloroquine 200mg , prednisone 5mg  at 7am, and multivitamin at 12pm    Supplemental oxygen during test? No    Lap distance in meters  34 meters    Laps Completed 6    Partial lap (in meters) 0 meters    Baseline BP (sitting) 120/72    Baseline Heartrate 91    Baseline Dyspnea (Borg Scale) 3    Baseline Fatigue (Borg Scale) 2    Baseline SPO2 100 %      End of Test Values    BP (sitting) 150/100    Heartrate 95    Dyspnea (Borg Scale) 6    Fatigue (Borg Scale) 4    SPO2 97 %      2 Minutes Post Walk Values   BP (sitting) 138/90    Heartrate 90    SPO2 100 %    Stopped or paused before six minutes? Yes    Reason: paused with 1 min 50 sec remaining for 2 minutes due to needing to catch her breath      Interpretation   Distance completed 204 meters    Tech Comments: pt walked at a slow pace with a left knee brace on, stumbled multiple times against the wall due to the left knee and brace. pt paused with 1 min 50 sec remaining for 2 min to catch breath but was able to fully finish the walk.

## 2020-08-26 LAB — ACTH: C206 ACTH: 6 pg/mL (ref 6–50)

## 2020-08-28 ENCOUNTER — Other Ambulatory Visit: Payer: Self-pay

## 2020-08-28 ENCOUNTER — Ambulatory Visit
Admission: RE | Admit: 2020-08-28 | Discharge: 2020-08-28 | Disposition: A | Discharge status: Home / Self Care | Payer: Medicare HMO | Source: Ambulatory Visit | Attending: Internal Medicine | Admitting: Internal Medicine

## 2020-08-28 DIAGNOSIS — E042 Nontoxic multinodular goiter: Secondary | ICD-10-CM

## 2020-08-28 NOTE — Telephone Encounter (Signed)
Patient called in this morning to let us know that she has received her new monitor and she has sent a transmission and it was sent successfully.

## 2020-08-29 ENCOUNTER — Telehealth: Payer: Self-pay | Admitting: Internal Medicine

## 2020-08-29 ENCOUNTER — Telehealth: Payer: Self-pay | Admitting: Cardiology

## 2020-08-29 NOTE — Telephone Encounter (Signed)
I have discussed the ultrasound findings with daughter Marcelino Duster on 08/29/2020 at 1800      Thyroid ultrasound the patient has a thyroid nodule that meets criteria for FNA  Per Marcelino Duster patient is on Xarelto and the cardiologist has recommended no interruption due to TIA symptoms while proceeding with endoscopic procedures. Her EGD is on hold at this time  We have discussed holding off on FNA at this time we will wait on her records and compare previous thyroid nodules size   We may also opt for short-term follow-up in 6 months   Marcelino Duster is in agreement of this

## 2020-08-29 NOTE — Telephone Encounter (Signed)
Per pt's daughter inflammatory marker is mildly elevated and was told to call .Reviewed pt's chart and daughter then stated was to call vascular Pt's daughter to get clarification if card  is needed or referral required for vascular Will also have office send copy of labs as well./cy

## 2020-08-29 NOTE — Telephone Encounter (Signed)
Patient's daughter called and said that the nurse wanted the cardiology know that she mildly elevated imflammatory marker

## 2020-08-29 NOTE — Telephone Encounter (Signed)
Patient went to PCP yesterday, and had blood work. PCP requested daughter advise Dr. Dimple Casey that her SED Rate was mildly elevated at 62. Please call to advise.

## 2020-09-01 NOTE — Telephone Encounter (Signed)
Thanks for the update. Is she having any particular symptoms or worsening issues at the time? We do not need to change plans if feeling okay since she is on some antiinflammatory treatments already, and can follow up for this over time in routine clinic appointment.

## 2020-09-01 NOTE — Telephone Encounter (Signed)
Spoke with patient's daughter, Marcelino Duster, advised if she is not having any particular symptoms or worsening issues, which she is not, there is no need to change treatment plan since she is on some antiinflammatory treatment already. Advised patient's daughter we will follow up as planned on 11/19/2020.

## 2020-09-11 ENCOUNTER — Telehealth: Payer: Self-pay | Admitting: Internal Medicine

## 2020-09-11 NOTE — Telephone Encounter (Signed)
Patient's daughter Marcelino Duster requests to be called at ph# [phone] re: Marcelino Duster states Patient has been experiencing hearing voices and hallucinating since starting Prednisone

## 2020-09-12 NOTE — Progress Notes (Signed)
Remote ICD transmission.   

## 2020-09-15 NOTE — Telephone Encounter (Signed)
Spoke with daughter Marcelino Duster and she stated that she will relay the message to her mother and she will cb to the office if mother decided to have referral placed.

## 2020-09-15 NOTE — Telephone Encounter (Signed)
Called and confirmed pt is still experiencing hallucinations and hearing voices since starting Prednisone. This week is not as severe as last week but pt request advise on following up.

## 2020-10-24 ENCOUNTER — Encounter: Payer: Medicare HMO | Admitting: Vascular Surgery

## 2020-10-27 ENCOUNTER — Encounter (HOSPITAL_COMMUNITY): Payer: Self-pay | Admitting: Internal Medicine

## 2020-10-27 ENCOUNTER — Other Ambulatory Visit: Payer: Self-pay

## 2020-10-27 ENCOUNTER — Ambulatory Visit (HOSPITAL_COMMUNITY)
Admission: RE | Admit: 2020-10-27 | Discharge: 2020-10-27 | Disposition: A | Discharge status: Home / Self Care | Payer: Medicare HMO | Source: Ambulatory Visit | Attending: Internal Medicine | Admitting: Internal Medicine

## 2020-10-27 VITALS — BP 160/78 | HR 73 | Wt 116.4 lb

## 2020-10-27 DIAGNOSIS — J449 Chronic obstructive pulmonary disease, unspecified: Secondary | ICD-10-CM | POA: Diagnosis not present

## 2020-10-27 DIAGNOSIS — Z95810 Presence of automatic (implantable) cardiac defibrillator: Secondary | ICD-10-CM | POA: Diagnosis not present

## 2020-10-27 DIAGNOSIS — M1990 Unspecified osteoarthritis, unspecified site: Secondary | ICD-10-CM | POA: Diagnosis not present

## 2020-10-27 DIAGNOSIS — E2749 Other adrenocortical insufficiency: Secondary | ICD-10-CM | POA: Diagnosis not present

## 2020-10-27 DIAGNOSIS — R5381 Other malaise: Secondary | ICD-10-CM | POA: Diagnosis not present

## 2020-10-27 DIAGNOSIS — Z8249 Family history of ischemic heart disease and other diseases of the circulatory system: Secondary | ICD-10-CM | POA: Insufficient documentation

## 2020-10-27 DIAGNOSIS — M3214 Glomerular disease in systemic lupus erythematosus: Secondary | ICD-10-CM | POA: Diagnosis not present

## 2020-10-27 DIAGNOSIS — Z79899 Other long term (current) drug therapy: Secondary | ICD-10-CM | POA: Diagnosis not present

## 2020-10-27 DIAGNOSIS — Z888 Allergy status to other drugs, medicaments and biological substances status: Secondary | ICD-10-CM | POA: Diagnosis not present

## 2020-10-27 DIAGNOSIS — I5032 Chronic diastolic (congestive) heart failure: Secondary | ICD-10-CM

## 2020-10-27 DIAGNOSIS — I351 Nonrheumatic aortic (valve) insufficiency: Secondary | ICD-10-CM | POA: Diagnosis not present

## 2020-10-27 DIAGNOSIS — I4892 Unspecified atrial flutter: Secondary | ICD-10-CM | POA: Diagnosis not present

## 2020-10-27 DIAGNOSIS — I5033 Acute on chronic diastolic (congestive) heart failure: Secondary | ICD-10-CM | POA: Insufficient documentation

## 2020-10-27 DIAGNOSIS — Z953 Presence of xenogenic heart valve: Secondary | ICD-10-CM | POA: Diagnosis not present

## 2020-10-27 DIAGNOSIS — I4821 Permanent atrial fibrillation: Secondary | ICD-10-CM | POA: Diagnosis not present

## 2020-10-27 DIAGNOSIS — I340 Nonrheumatic mitral (valve) insufficiency: Secondary | ICD-10-CM | POA: Diagnosis not present

## 2020-10-27 DIAGNOSIS — J8410 Pulmonary fibrosis, unspecified: Secondary | ICD-10-CM | POA: Insufficient documentation

## 2020-10-27 DIAGNOSIS — N189 Chronic kidney disease, unspecified: Secondary | ICD-10-CM | POA: Insufficient documentation

## 2020-10-27 DIAGNOSIS — I272 Pulmonary hypertension, unspecified: Secondary | ICD-10-CM | POA: Diagnosis not present

## 2020-10-27 DIAGNOSIS — I130 Hypertensive heart and chronic kidney disease with heart failure and stage 1 through stage 4 chronic kidney disease, or unspecified chronic kidney disease: Secondary | ICD-10-CM | POA: Insufficient documentation

## 2020-10-27 DIAGNOSIS — Z7901 Long term (current) use of anticoagulants: Secondary | ICD-10-CM | POA: Insufficient documentation

## 2020-10-27 DIAGNOSIS — I495 Sick sinus syndrome: Secondary | ICD-10-CM | POA: Insufficient documentation

## 2020-10-27 DIAGNOSIS — R0600 Dyspnea, unspecified: Secondary | ICD-10-CM | POA: Insufficient documentation

## 2020-10-27 LAB — CBC
HCT: 30.4 % — ABNORMAL LOW (ref 36.0–46.0)
Hemoglobin: 10 g/dL — ABNORMAL LOW (ref 12.0–15.0)
MCH: 33.1 pg (ref 26.0–34.0)
MCHC: 32.9 g/dL (ref 30.0–36.0)
MCV: 100.7 fL — ABNORMAL HIGH (ref 80.0–100.0)
Platelets: 135 10*3/uL — ABNORMAL LOW (ref 150–400)
RBC: 3.02 MIL/uL — ABNORMAL LOW (ref 3.87–5.11)
RDW: 13.9 % (ref 11.5–15.5)
WBC: 6.5 10*3/uL (ref 4.0–10.5)
nRBC: 0 % (ref 0.0–0.2)

## 2020-10-27 LAB — BASIC METABOLIC PANEL
Anion gap: 8 (ref 5–15)
BUN: 22 mg/dL (ref 8–23)
CO2: 26 mmol/L (ref 22–32)
Calcium: 9.2 mg/dL (ref 8.9–10.3)
Chloride: 105 mmol/L (ref 98–111)
Creatinine, Ser: 1 mg/dL (ref 0.44–1.00)
GFR, Estimated: 59 mL/min — ABNORMAL LOW (ref >60)
Glucose, Bld: 96 mg/dL (ref 70–99)
Potassium: 3.8 mmol/L (ref 3.5–5.1)
Sodium: 139 mmol/L (ref 135–145)

## 2020-10-27 LAB — BRAIN NATRIURETIC PEPTIDE: B Natriuretic Peptide: 419.2 pg/mL — ABNORMAL HIGH (ref 0.0–100.0)

## 2020-10-27 NOTE — Progress Notes (Signed)
[facility] CLIN[facility] CONSULT NOTE  Referring Physician: Dr. Lalla Brothers Primary Care: Hyacinth Meeker, [place] Primary Cardiologist: EP Steffanie Dunn  HPI:  75 yo female with pmh significant for chronic diastolic CHF, permanent A. fib, lupus, pulmonary fibrosis, pulmonary hypertension. Paroxysmal VT, OSA not on cpap, valvular disease Aortic, Mitral and Tricuspid, aflutter s/p ablation. Referred by Dr. Lalla Brothers for further evaluation of PAH and valvular heart disease  Mid 1990's developed substantial weight loss and severe arthritis diagnosed with SLE.    Aflutter ablation ~ 2003.  Did subsequently develop afib.    History of valvular disease with multiple replaced valves AVR/MV ring/TVR 09/25/2010.  Paroxysmal VT, ICD placed due to torsades on Flecainide with SSS requiring PPM which predated valve repar/replacement surgery 09/25/2010 and in fact ICD lead was damaged during valve replacement surgery.       History of chronic COPD and pulmonary fibrosis followed by pulmonology.  On triple therapy for COPD vs asthma,  nintedanib for PF. 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  Did not smoke but inhaled second hand smoke from husband and mother pretty much her whole life until 01/2020.  Continued nintedanib switched inhaler therapy to [facility].    2016 ECHO EF 55%, moderately reduced RV function, mild MR with normally functioning valve s/p MV ring, mild TR with normal functioning bioprosthesis.  No AI w/normally functioning aortic bioprosthesis.    Echocardiogram 12/06/2018: EF 63%, pacemaker in right heart, normal bioprosthetic tricuspid and aortic valve with mild tricuspid regurgitation, mitral valve repair with mild mitral regurgitation, severe biatrial enlargement.   Admitted at [facility] healthcare [facility] for hypotension and acute kidney injury 03/10/2020.  She was placed on broad-spectrum antibiotics with initial diagnosis of sepsis, no source of infection found also required Levophed and IVF infusion  to support blood pressure, started on COPD therapy.  There was question of whether adrenal insufficiency contributing.  Steroid therapy reinstituted.   Echo 03/11/2020  LVEF 55 to 60%, moderate to severe tricuspid regurgitation, moderately elevated right ventricular systolic pressure estimate 28mm Hg, moderate mitral regurgitation.  RV not well visualized.    Has felt more tired the last two weeks.  Does have dyspnea on exertion, gets dyspneic just going from waiting room to the exam room.  Feels this is fairly acute worse over the last month.  6 months ago she was able to do everything except go up and down stairs without getting dyspneic.  Takes lasix when systolic >110 and diastolic >60.  Uses same parameters for metoprolol  XL.  She meets these parameters most days.  No light headedness or dizziness for months.  Did not take her home medications yet today, normally bp sig lower.   Review of Systems: [y] = yes, [ ]  = no   General: Weight gain [ ] ; Weight loss [ ] ; Anorexia [ ] ; Fatigue ]; Fever [ ] ; Chills [ ] ; Weakness [ ]   Cardiac: Chest pain/pressure [ ] ; Resting SOB [ y]; Exertional SOB [ y]; Orthopnea [ ] ; Pedal Edema [ ] ; Palpitations [ ] ; Syncope [ ] ; Presyncope [ ] ; Paroxysmal nocturnal dyspnea[ ]   Pulmonary: Cough [ y]; Wheezing[ ] ; Hemoptysis[ ] ; Sputum [ ] ; Snoring [ y]  GI: Vomiting[ ] ; Dysphagia[ ] ; Melena[ ] ; Hematochezia [ ] ; Heartburn[ ] ; Abdominal pain [ ] ; Constipation [ ] ; Diarrhea [ ] ; BRBPR [ ]   GU: Hematuria[ ] ; Dysuria [ ] ; Nocturia[ ]   Vascular: Pain in legs with walking [ ] ; Pain in feet with lying flat [ ] ;  Non-healing sores [ ] ; Stroke [ ] ; TIA [ ] ; Slurred speech [ ] ;  Neuro: Headaches[ ] ; Vertigo[ ] ; Seizures[ ] ; Paresthesias[ ] ;Blurred vision [ ] ; Diplopia [ ] ; Vision changes [ ]   Ortho/Skin: Arthritis [ y]; Joint pain ]; Muscle pain [ ] ; Joint swelling [ ] ; Back Pain [ ] ; Rash [ ]   Psych: Depression[ ] ; Anxiety[ ]   Heme: Bleeding problems [ ] ; Clotting  disorders [ ] ; Anemia ]  Endocrine: Diabetes [ ] ; Thyroid dysfunction[ ]    Past Medical History:  Diagnosis Date   A-fib (HCC)    Anemia    Cardiomyopathy (HCC)    CHF (congestive heart failure) (HCC)    CKD (chronic kidney disease)    Diverticulosis    Hypertension    Lupus (HCC)    Osteoarthritis    Pulmonary fibrosis (HCC)    Vitamin D deficiency     Current Outpatient Medications  Medication Sig Dispense Refill   albuterol (VENTOLIN HFA) 108 (90 Base) MCG/ACT inhaler Inhale 2 puffs into the lungs every 6 (six) hours as needed for wheezing or shortness of breath.     allopurinol (ZYLOPRIM) 100 MG tablet Take 1 tablet (100 mg total) by mouth daily. 90 tablet 0   amoxicillin (AMOXIL) 500 MG capsule Take 500 mg by mouth 2 (two) times daily as needed (Dental procedures).     Budeson-Glycopyrrol-Formoterol (BREZTRI AEROSPHERE) 160-9-4.8 MCG/ACT AERO Inhale 2 puffs into the lungs in the morning and at bedtime. 10.7 g 0   Calcium Carbonate-Vitamin D (CALTRATE 600+D PO) Take 1 capsule by mouth daily.     cetirizine HCl (ZYRTEC) 5 MG/5ML SOLN Take 5 mg by mouth daily.     Cholecalciferol 25 MCG (1000 UT) tablet Take 1 tablet by mouth daily.     feeding supplement (ENSURE IMMUNE HEALTH) LIQD Take 237 mLs by mouth 3 (three) times daily with meals.     fluticasone (FLONASE) 50 MCG/ACT nasal spray Place 2 sprays into both nostrils daily as needed for allergies.     folic acid (FOLVITE) 800 MCG tablet Take 400 mcg by mouth daily.     furosemide (LASIX) 20 MG tablet Take 20 mg by mouth daily.     hydroxychloroquine (PLAQUENIL) 200 MG tablet Take 1 tablet (200 mg total) by mouth daily. 90 tablet 0   levothyroxine (SYNTHROID) 25 MCG tablet Take 1 tablet (25 mcg total) by mouth daily. 90 tablet 3   metoprolol succinate (TOPROL-XL) 25 MG 24 hr tablet Take 1 tablet by mouth daily. Hold dose if <110 SBP or <60 DBP     Multiple Vitamin (MULTIVITAMIN) LIQD Take 5 mLs by mouth daily.     Nintedanib  (OFEV) 150 MG CAPS Take 150 mg by mouth 2 (two) times daily.     predniSONE (DELTASONE) 5 MG tablet Take 1 tablet (5 mg total) by mouth daily with breakfast. Patient to double up dose during sick day rule 100 tablet 3   rivaroxaban (XARELTO) 20 MG TABS tablet Take 1 tablet (20 mg total) by mouth daily with supper. 30 tablet 11   senna-docusate (SENOKOT-S) 8.6-50 MG tablet Take 2 tablets by mouth as needed for irritation.     No current facility-administered medications for this encounter.    Allergies  Allergen Reactions   Propofol Other (See Comments)    Other reaction(s): asthma, Unknown/Patient and Family Unable to Define   Flecainide Other (See Comments)    Other reaction(s): unknown   Amiodarone Other (See Comments)  Other reaction(s): Delerium/Confusion/Psychosis, unknown   Amlodipine Other (See Comments)    Other reaction(s): unknown Tired, syncope      Social History   Socioeconomic History   Marital status: Legally Separated    Spouse name: Not on file   Number of children: Not on file   Years of education: Not on file   Highest education level: Not on file  Occupational History   Not on file  Tobacco Use   Smoking status: Never   Smokeless tobacco: Never  Vaping Use   Vaping Use: Never used  Substance and Sexual Activity   Alcohol use: Never   Drug use: Never   Sexual activity: Not on file  Other Topics Concern   Not on file  Social History Narrative   Not on file   Social Determinants of Health   Financial Resource Strain: Not on file  Food Insecurity: Not on file  Transportation Needs: Not on file  Physical Activity: Not on file  Stress: Not on file  Social Connections: Not on file  Intimate Partner Violence: Not on file      Family History  Problem Relation Age of Onset   Heart disease Mother    Hypertension Mother    Rheum arthritis Mother    Osteoarthritis Mother    Heart disease Father    Hypertension Father    Osteoarthritis Father     Heart disease Sister    Diabetes Brother    Cancer Brother     Vitals:   10/27/20 1445  BP: (!) 160/78  Pulse: 73  SpO2: 97%  Weight: 52.8 kg (116 lb 6.4 oz)    PHYSICAL EXAM: General:  Elderly. No respiratory difficulty HEENT: normal Neck: supple. JVD 10 prominent C-V waves. 3/6 systolic murmur, Carotids 2+ bilat; no bruits. No lymphadenopathy or thryomegaly appreciated. Cor: PMI nondisplaced. Regular rate & rhythm. No rubs, gallops or murmurs. Lungs: dry crackles bibasilar Abdomen: soft, nontender, nondistended. No hepatosplenomegaly. No bruits or masses. Good bowel sounds. Extremities: no cyanosis, clubbing, rash, edema Neuro: alert & oriented x 3, cranial nerves grossly intact. moves all 4 extremities w/o difficulty. Affect pleasant.   ASSESSMENT & PLAN:  Acute on Chronic Diastolic CHF due to valvular heart disease -uncertain cause of valvular dysfunction.  Does have hx of SLE -s/p bioprosthetic AVR/MV ring/bioprosthetic TVR 09/25/2010 -progression of valvular dysfunction since 2016 ECHO EF 55%, moderately reduced RV function, mild MR with normally functioning valve s/p MV ring, mild TR with normal functioning bioprosthesis.  No AI w/normally functioning aortic bioprosthesis. -Echocardiogram 12/06/2018: EF 63%, pacemaker in right heart, normal bioprosthetic tricuspid and aortic valve with mild tricuspid regurgitation, mitral valve repair with mild mitral regurgitation, severe biatrial enlargement.  -Echo 03/11/2020  LVEF 55 to 60%, moderate to severe tricuspid regurgitation, moderately elevated right ventricular systolic pressure estimate 50mm Hg, moderate mitral regurgitation.  RV not well visualized. AV function not assessed. -NYHA class III, mild volume up on exam, REDS 32% -She has several potential reasons for her continued decline and symptoms. Worsening pulmonary disease, worsening valvular heart disease and worsening pulmonary HTN and any combination of all of these.   There is a potential for CAD but less likely due to no hx in 2012. -begin workup with repeat TTE, expect she will need TEE and L/RHC in near future   2. Pulmonary HTN by ECHO -Given serial echo results and RV dysfunction predating the left sided valvular issues I suspect high RVSP estimate primarily PH driven likely WHO group 3  but cannot be certain.   3. ILD -11/2018 HRCT with stable ILD compared with 2018.  2018 CT Slowly progressive basal predominant, moderately severe pulmonary fibrosis. Favor NSIP -no visible PFT's on care everywhere -followed now by [name] and continued on Ofev, bronchodilators  4. OSA -resolved with weight loss and cpap discontinued  5. Permanent Afib with SSS - has ICD in place due to h/o VT  -Followed by Dr. Lalla Brothers, 04/2020 was 99% V pacing.  Switched to xarelto for nosebleeds  6. H/o VT - ICD in palce  7. SLE -followed by Dr. Dimple Casey, lupus felt to be under good control -continued on HCQ and she is also on 5mg  prednisone for secondary adrenal insufficiency  , MD  Patient seen and examined with the above-signed Advanced Practice Provider and/or Housestaff. I personally reviewed laboratory data, imaging studies and relevant notes. I independently examined the patient and formulated the important aspects of the plan. I have edited the note to reflect any of my changes or salient points. I have personally discussed the plan with the patient and/or family.  75 y/o woman with complicated medical h/o including SLE, OSA, ILD, valvular heart disease s/p previous AVR, TVR and MV repair, chronic AF, and VT s/p ICD.   Recently moved from [place] to be with family in setting of worsening DOE. Now NYHA IIIB which is apparently quite a change from 6 months ago. Denies CP, edema, orthopnea or PND. Recently saw Dr. Beaumont and inhalers adjusted. We do not have PFTs on chart.   Echo report from [facility] EF 55-60% moderate to severe tricuspid  regurgitation, moderately elevated right ventricular systolic pressure estimate 73mm Hg, moderate mitral regurgitation.  RV not well visualized. AV function not assessed.  ReDS 32%   General:  Elderly frail No resp difficulty HEENT: normal Neck: supple.JVP 8-10 with prominent v- waves. Carotids 2+ bilat; no bruits. No lymphadenopathy or thryomegaly appreciated. Cor: PMI nondisplaced. Regular rate & rhythm. No rubs, gallops or murmurs. Lungs: decreased throughout fine crackles at bases  Abdomen: soft, nontender, nondistended. No hepatosplenomegaly. No bruits or masses. Good bowel sounds. Extremities: no cyanosis, clubbing, rash, edema Neuro: alert & orientedx3, cranial nerves grossly intact. moves all 4 extremities w/o difficulty. Affect pleasant  ECG AF with v pacing  She is quite debilitated with marked DOE which has progressed substantially over past 6 months by her reports. Suspect this is likely multifactorial due to lung disease, valvular heart disease and deconditioning.   Will repeat echo and will likely need R/LHC as well as PFTs. Possible TEE  41m, MD  7:49 PM

## 2020-10-27 NOTE — Patient Instructions (Addendum)
Labs done today, your results will be available in MyChart, we will contact you for abnormal readings.  Your physician has requested that you have an echocardiogram. Echocardiography is a painless test that uses sound waves to create images of your heart. It provides your doctor with information about the size and shape of your heart and how well your heart's chambers and valves are working. This procedure takes approximately one hour. There are no restrictions for this procedure.  Your physician recommends that you schedule a follow-up appointment in: 2 months  If you have any questions or concerns before your next appointment please send [place] a message through [place] or call our office at [phone].    TO LEAVE A MESSAGE FOR THE NURSE SELECT OPTION 2, PLEASE LEAVE A MESSAGE INCLUDING: YOUR NAME DATE OF BIRTH CALL BACK NUMBER REASON FOR CALL**this is important as we prioritize the call backs  YOU WILL RECEIVE A CALL BACK THE SAME DAY AS LONG AS YOU CALL BEFORE 4:00 PM  At the [facility], you and your health needs are our priority. As part of our continuing mission to provide you with exceptional heart care, we have created designated Provider Care Teams. These Care Teams include your primary Cardiologist (physician) and Advanced Practice Providers (APPs- Physician Assistants and Nurse Practitioners) who all work together to provide you with the care you need, when you need it.   You may see any of the following providers on your designated Care Team at your next follow up: Dr Arvilla Meres Dr Marca Ancona Dr Brandon Melnick, NP Robbie Lis, [name] Mikki Santee Karle Plumber, PharmD   Please be sure to bring in all your medications bottles to every appointment.

## 2020-10-27 NOTE — Progress Notes (Signed)
ReDS Vest / Clip - 10/27/20 1500       ReDS Vest / Clip   Station Marker A    Ruler Value 27    ReDS Value Range Low volume    ReDS Actual Value 32

## 2020-10-31 ENCOUNTER — Ambulatory Visit (INDEPENDENT_AMBULATORY_CARE_PROVIDER_SITE_OTHER): Payer: Medicare HMO | Admitting: Vascular Surgery

## 2020-10-31 ENCOUNTER — Other Ambulatory Visit: Payer: Self-pay

## 2020-10-31 ENCOUNTER — Ambulatory Visit (HOSPITAL_COMMUNITY)
Admission: RE | Admit: 2020-10-31 | Discharge: 2020-10-31 | Disposition: A | Discharge status: Home / Self Care | Payer: Medicare HMO | Source: Ambulatory Visit | Attending: Internal Medicine | Admitting: Internal Medicine

## 2020-10-31 ENCOUNTER — Encounter: Payer: Self-pay | Admitting: Vascular Surgery

## 2020-10-31 VITALS — BP 171/101 | HR 80 | Temp 97.9°F | Resp 18 | Ht 62.0 in | Wt 115.0 lb

## 2020-10-31 DIAGNOSIS — I340 Nonrheumatic mitral (valve) insufficiency: Secondary | ICD-10-CM

## 2020-10-31 DIAGNOSIS — I083 Combined rheumatic disorders of mitral, aortic and tricuspid valves: Secondary | ICD-10-CM | POA: Insufficient documentation

## 2020-10-31 DIAGNOSIS — I351 Nonrheumatic aortic (valve) insufficiency: Secondary | ICD-10-CM

## 2020-10-31 DIAGNOSIS — M25562 Pain in left knee: Secondary | ICD-10-CM

## 2020-10-31 DIAGNOSIS — M25561 Pain in right knee: Secondary | ICD-10-CM

## 2020-10-31 DIAGNOSIS — G8929 Other chronic pain: Secondary | ICD-10-CM | POA: Diagnosis not present

## 2020-10-31 DIAGNOSIS — I739 Peripheral vascular disease, unspecified: Secondary | ICD-10-CM

## 2020-10-31 LAB — ECHOCARDIOGRAM COMPLETE
AR max vel: 1.21 cm2
AV Area VTI: 1.14 cm2
AV Area mean vel: 1.1 cm2
AV Mean grad: 18.6 mmHg
AV Peak grad: 32.3 mmHg
Ao pk vel: 2.84 m/s
Area-P 1/2: 3.48 cm2
Calc EF: 4 %
MV VTI: 1.93 cm2
S' Lateral: 3.1 cm
Single Plane A2C EF: 55.3 %
Single Plane A4C EF: -6.8 %

## 2020-10-31 NOTE — Progress Notes (Signed)
  Echocardiogram 2D Echocardiogram has been performed.  Janalyn Harder 10/31/2020, 10:12 AM

## 2020-10-31 NOTE — Progress Notes (Signed)
Office Note     CC: Abnormal ABI Requesting Provider:  Collene Mares, PA  HPI: Lindsey Huber is a 75 y.o. (1945/10/23) female who presents at the request of her PCP after recent outside ABI demonstrated concern for left lower extremity peripheral arterial disease.  [name] presents to the office accompanied by her daughter.  She recently moved from [place] after retirement.  Currently describes most of her days being rather sedentary, but with ambulation denies symptoms of claudication, rest pain.  She has no history of lower extremity wounds.  Ambulation is mostly limited by bilateral knee pain.  She was told she has very little cartilage left in either knee. Pt also has history of gout, and well-controlled SLE.  The pt is not on a statin for cholesterol management.  The pt is not on a daily aspirin.   Other AC:  Xarelto The pt is not on medication for hypertension.   The pt is not diabetic.   Tobacco hx: Non-smoker, secondhand smoke from husband  Past Medical History:  Diagnosis Date   A-fib (HCC)    Anemia    Cardiomyopathy (HCC)    CHF (congestive heart failure) (HCC)    CKD (chronic kidney disease)    Diverticulosis    Hypertension    Lupus (HCC)    Osteoarthritis    Pulmonary fibrosis (HCC)    Vitamin D deficiency     Past Surgical History:  Procedure Laterality Date   CARDIAC VALVE SURGERY     CARPAL TUNNEL RELEASE     HEMORRHOID SURGERY     PACEMAKER INSERTION     TONSILLECTOMY      Social History   Socioeconomic History   Marital status: Legally Separated    Spouse name: Not on file   Number of children: Not on file   Years of education: Not on file   Highest education level: Not on file  Occupational History   Not on file  Tobacco Use   Smoking status: Never   Smokeless tobacco: Never  Vaping Use   Vaping Use: Never used  Substance and Sexual Activity   Alcohol use: Never   Drug use: Never   Sexual activity: Not on file  Other Topics  Concern   Not on file  Social History Narrative   Not on file   Social Determinants of Health   Financial Resource Strain: Not on file  Food Insecurity: Not on file  Transportation Needs: Not on file  Physical Activity: Not on file  Stress: Not on file  Social Connections: Not on file  Intimate Partner Violence: Not on file    Family History  Problem Relation Age of Onset   Heart disease Mother    Hypertension Mother    Rheum arthritis Mother    Osteoarthritis Mother    Heart disease Father    Hypertension Father    Osteoarthritis Father    Heart disease Sister    Diabetes Brother    Cancer Brother     Current Outpatient Medications  Medication Sig Dispense Refill   albuterol (VENTOLIN HFA) 108 (90 Base) MCG/ACT inhaler Inhale 2 puffs into the lungs every 6 (six) hours as needed for wheezing or shortness of breath.     allopurinol (ZYLOPRIM) 100 MG tablet Take 1 tablet (100 mg total) by mouth daily. 90 tablet 0   amoxicillin (AMOXIL) 500 MG capsule Take 500 mg by mouth 2 (two) times daily as needed (Dental procedures).     Calcium Carbonate-Vitamin D (  CALTRATE 600+D PO) Take 1 capsule by mouth daily.     cetirizine HCl (ZYRTEC) 5 MG/5ML SOLN Take 5 mg by mouth daily.     Cholecalciferol 25 MCG (1000 UT) tablet Take 1 tablet by mouth daily.     feeding supplement (ENSURE IMMUNE HEALTH) LIQD Take 237 mLs by mouth 3 (three) times daily with meals.     fluticasone (FLONASE) 50 MCG/ACT nasal spray Place 2 sprays into both nostrils daily as needed for allergies.     folic acid (FOLVITE) 800 MCG tablet Take 400 mcg by mouth daily.     furosemide (LASIX) 20 MG tablet Take 20 mg by mouth daily.     hydroxychloroquine (PLAQUENIL) 200 MG tablet Take 1 tablet (200 mg total) by mouth daily. 90 tablet 0   levothyroxine (SYNTHROID) 25 MCG tablet Take 1 tablet (25 mcg total) by mouth daily. 90 tablet 3   metoprolol succinate (TOPROL-XL) 25 MG 24 hr tablet Take 1 tablet by mouth daily.  Hold dose if <110 SBP or <60 DBP     Multiple Vitamin (MULTIVITAMIN) LIQD Take 5 mLs by mouth daily.     Nintedanib (OFEV) 150 MG CAPS Take 150 mg by mouth 2 (two) times daily.     predniSONE (DELTASONE) 5 MG tablet Take 1 tablet (5 mg total) by mouth daily with breakfast. Patient to double up dose during sick day rule 100 tablet 3   rivaroxaban (XARELTO) 20 MG TABS tablet Take 1 tablet (20 mg total) by mouth daily with supper. 30 tablet 11   senna-docusate (SENOKOT-S) 8.6-50 MG tablet Take 2 tablets by mouth as needed for irritation.     No current facility-administered medications for this visit.    Allergies  Allergen Reactions   Propofol Other (See Comments)    Other reaction(s): asthma, Unknown/Patient and Family Unable to Define   Flecainide Other (See Comments)    Other reaction(s): unknown   Amiodarone Other (See Comments)    Other reaction(s): Delerium/Confusion/Psychosis, unknown   Amlodipine Other (See Comments)    Other reaction(s): unknown Tired, syncope     REVIEW OF SYSTEMS:   [X]  denotes positive finding, [ ]  denotes negative finding Cardiac  Comments:  Chest pain or chest pressure:    Shortness of breath upon exertion:    Short of breath when lying flat:    Irregular heart rhythm:        Vascular    Pain in calf, thigh, or hip brought on by ambulation:    Pain in feet at night that wakes you up from your sleep:     Blood clot in your veins:    Leg swelling:         Pulmonary    Oxygen at home:    Productive cough:     Wheezing:         Neurologic    Sudden weakness in arms or legs:     Sudden numbness in arms or legs:     Sudden onset of difficulty speaking or slurred speech:    Temporary loss of vision in one eye:     Problems with dizziness:         Gastrointestinal    Blood in stool:     Vomited blood:         Genitourinary    Burning when urinating:     Blood in urine:        Psychiatric    Major depression:  Hematologic     Bleeding problems:    Problems with blood clotting too easily:        Skin    Rashes or ulcers:        Constitutional    Fever or chills:      PHYSICAL EXAMINATION:  Vitals:   10/31/20 0945  BP: (!) 171/101  Pulse: 80  Resp: 18  Temp: 97.9 F (36.6 C)  SpO2: 98%  Weight: 115 lb (52.2 kg)  Height: 5\' 2"  (1.575 m)    General:  WDWN in NAD; vital signs documented above Gait: Not observed HENT: WNL, normocephalic Pulmonary: normal non-labored breathing , without Rales, rhonchi,  wheezing Cardiac: regular HR, irregular rhythm Abdomen: soft, NT, no masses Skin: without rashes Vascular Exam/Pulses:  Right Left                  DP 2+ (normal) 2+ (normal)  PT 1+ (weak) 1+ (weak)   Extremities: without ischemic changes, without Gangrene , without cellulitis; without open wounds;  Musculoskeletal: no muscle wasting or atrophy  Neurologic: A&O X 3;  No focal weakness or paresthesias are detected Psychiatric:  The pt has Normal affect.   Non-Invasive Vascular Imaging:   Outside ankle-brachial index was independently reviewed demonstrating a left sided ABI of 0.37.    ASSESSMENT/PLAN:: 75 y.o. female presenting with outside ABI noting moderate to severe left leg peripheral arterial disease.  This is inconsistent with her physical exam which demonstrated palpable pedal pulses bilaterally.  Avin has no history of claudication, rest pain, tissue loss.  I believe the outside ABI is incorrect, as an ABI of 0.37 is usually consistent with significant claudication symptoms, possible rest pain.  I discussed repeating the ABI in our office with both Alice and her daughter due to the discrepancy outlined above. I will call them with the results.   Eber Jones, MD [facility] [phone]

## 2020-11-05 ENCOUNTER — Encounter (HOSPITAL_COMMUNITY): Payer: Medicare HMO

## 2020-11-06 ENCOUNTER — Other Ambulatory Visit: Payer: Self-pay | Admitting: Internal Medicine

## 2020-11-06 DIAGNOSIS — M1A09X0 Idiopathic chronic gout, multiple sites, without tophus (tophi): Secondary | ICD-10-CM

## 2020-11-07 ENCOUNTER — Ambulatory Visit (HOSPITAL_COMMUNITY)
Admission: RE | Admit: 2020-11-07 | Discharge: 2020-11-07 | Disposition: A | Discharge status: Home / Self Care | Payer: Medicare HMO | Source: Ambulatory Visit | Attending: Vascular Surgery | Admitting: Vascular Surgery

## 2020-11-07 ENCOUNTER — Other Ambulatory Visit: Payer: Self-pay

## 2020-11-07 DIAGNOSIS — I739 Peripheral vascular disease, unspecified: Secondary | ICD-10-CM | POA: Diagnosis not present

## 2020-11-12 ENCOUNTER — Ambulatory Visit: Payer: Medicare HMO | Attending: Internal Medicine | Admitting: Physical Therapy

## 2020-11-12 ENCOUNTER — Other Ambulatory Visit: Payer: Self-pay

## 2020-11-12 DIAGNOSIS — R262 Difficulty in walking, not elsewhere classified: Secondary | ICD-10-CM | POA: Insufficient documentation

## 2020-11-12 DIAGNOSIS — R2689 Other abnormalities of gait and mobility: Secondary | ICD-10-CM | POA: Insufficient documentation

## 2020-11-12 DIAGNOSIS — M6281 Muscle weakness (generalized): Secondary | ICD-10-CM | POA: Diagnosis present

## 2020-11-12 DIAGNOSIS — R293 Abnormal posture: Secondary | ICD-10-CM | POA: Diagnosis present

## 2020-11-12 NOTE — Patient Instructions (Signed)
Access Code: [email or web address] URL: [email or web address] Date: 11/12/2020 Prepared by: Vernon Prey April Kirstie Peri  Exercises Supine Bridge - 1 x daily - 7 x weekly - 2 sets - 10 reps Clamshell - 1 x daily - 7 x weekly - 2 sets - 10 reps Supine Quad Set - 1 x daily - 7 x weekly - 2 sets - 10 reps Supine Posterior Pelvic Tilt - 1 x daily - 7 x weekly - 2 sets - 10 reps Supine Lower Trunk Rotation - 1 x daily - 7 x weekly - 5 sets - 5-10 sec hold

## 2020-11-12 NOTE — Therapy (Signed)
[facility] [address] [place], [place], [address] Phone: [phone]   Fax:  [phone]  Physical Therapy Evaluation  Patient Details  Name: Lindsey Huber MRN: [id number] Date of Birth: October 25, 1945 No data recorded  Encounter Date: 11/12/2020   PT End of Session - 11/12/20 1659     Visit Number 1    Number of Visits 16    Date for PT Re-Evaluation 01/07/21    Authorization Type [employer] Medicare    PT Start Time 1700    PT Stop Time 1745    PT Time Calculation (min) 45 min    Equipment Utilized During Treatment Gait belt    Activity Tolerance Patient tolerated treatment well    Behavior During Therapy WFL for tasks assessed/performed             Past Medical History:  Diagnosis Date   A-fib (HCC)    Anemia    Cardiomyopathy (HCC)    CHF (congestive heart failure) (HCC)    CKD (chronic kidney disease)    Diverticulosis    Hypertension    Lupus (HCC)    Osteoarthritis    Pulmonary fibrosis (HCC)    Vitamin D deficiency     Past Surgical History:  Procedure Laterality Date   CARDIAC VALVE SURGERY     CARPAL TUNNEL RELEASE     HEMORRHOID SURGERY     PACEMAKER INSERTION     TONSILLECTOMY      There were no vitals filed for this visit.    Subjective Assessment - 11/12/20 1703     Subjective Pt was referred by her cardiologist for conditioning. Pt has been feeling tired and SHOB which has slowly been worsening. Pt reports some LE pain mostly in the knees. Also notes increased LE spasms.    Patient is accompained by: Family member   daughter   Pertinent History A-fib, anemia, CHF, CKD, gout, HTN, OA, pulmonary fibrosis, lupus    Limitations Standing;Walking;Lifting    How long can you sit comfortably? no issues    How long can you stand comfortably? ~1 hr    How long can you walk comfortably? ~1 hr    Patient Stated Goals Increase strength, walk without getting so out of breath    Currently in Pain?  Yes    Pain Score 3     Pain Location Knee    Pain Orientation Left;Right    Pain Descriptors / Indicators Sore    Pain Type Chronic pain    Pain Onset More than a month ago    Pain Frequency Constant    Aggravating Factors  Mornings    Pain Relieving Factors Sitting                [facility] PT Assessment - 11/12/20 0001       Home Environment   Living Environment Private residence    Living Arrangements Children    Type of Home House    Home Access Level entry    Home Layout One level      Prior Function   Level of Independence Independent    Leisure Coloring      ROM / Strength   AROM / PROM / Strength Strength      Strength   Overall Strength Comments L knee flexion and extension 4/5 (limited by pain); R knee flex and ext 4+/5.    Strength Assessment Site Hip    Right/Left Hip Right;Left    Right  Hip Flexion 4/5    Right Hip Extension 4/5    Right Hip ABduction 4-/5    Left Hip Flexion 4-/5    Left Hip Extension 4/5    Left Hip ABduction 4-/5      Transfers   Five time sit to stand comments  45 sec      6 Minute walk- Post Test   6 Minute Walk Post Test yes    HR (bpm) 95    02 Sat (%RA) 95 %    Modified Borg Scale for Dyspnea 6-      6 minute walk test results    Endurance additional comments 7 laps around gym before fatigue at 54 sec                        Objective measurements completed on examination: See above findings.       [facility] Adult PT Treatment/Exercise - 11/12/20 0001       Exercises   Exercises Knee/Hip      Knee/Hip Exercises: Stretches   Other Knee/Hip Stretches lower trunk rotation 5x10 sec      Knee/Hip Exercises: Supine   Quad Sets Strengthening;Both;10 reps    Bridges Strengthening;Both;10 reps    Other Supine Knee/Hip Exercises Pelvic tilt x10      Knee/Hip Exercises: Sidelying   Clams x10 bilat                     PT Education - 11/12/20 1750     Education Details Discussed exam  findings, POC, and HEP    Person(s) Educated Patient    Methods Explanation;Demonstration;Tactile cues;Verbal cues;Handout    Comprehension Verbalized understanding;Returned demonstration;Verbal cues required;Tactile cues required              PT Short Term Goals - 11/12/20 1758       PT SHORT TERM GOAL #1   Title Pt will be independent with initial HEP    Time 4    Period Weeks    Status New    Target Date 12/10/20      PT SHORT TERM GOAL #2   Title Pt will be able to tolerate walking at least 6 minutes to demo improving endurance    Time 6    Period Weeks    Status New    Target Date 12/24/20      PT SHORT TERM GOAL #3   Title Pt will be able to perform 1 sit to stand without UE support    Time 4    Period Weeks    Status New    Target Date 12/10/20               PT Long Term Goals - 11/12/20 1759       PT LONG TERM GOAL #1   Title Pt will be independent with advanced HEP    Time 8    Period Weeks    Status New    Target Date 01/07/21      PT LONG TERM GOAL #2   Title Pt will be able to walk at least 12 laps around the gym within 6 min to demo improved endurance    Baseline only able to walk 7 laps in 3 min 54 sec    Time 8    Period Weeks    Status New    Target Date 01/07/21      PT LONG TERM GOAL #3  Title Pt will be able to perform 5x STS without UE support in </=20 sec    Baseline 46 sec with bilat UE support    Time 8    Period Weeks    Status New    Target Date 01/07/21      PT LONG TERM GOAL #4   Title Pt will report decrease in pain by at least 25%    Time 8    Period Weeks    Status New    Target Date 01/07/21                    Plan - 11/12/20 1752     Clinical Impression Statement Pt is a 75 y/o F referred to [facility] for physical deconditioning. Pt demos bilat LE weakness but mostly in hips and knees (L worse than R). Pt only able to tolerate <4 min of amb (~7 laps from gym to front desk). Pt would benefit from PT to  improve her home and community mobility.    Personal Factors and Comorbidities Age;Fitness;Time since onset of injury/illness/exacerbation    Examination-Activity Limitations Squat;Stairs;Locomotion Level;Transfers    Examination-Participation Restrictions Community Activity;Shop;Meal Prep;Cleaning    Stability/Clinical Decision Making Stable/Uncomplicated    Clinical Decision Making Low    Rehab Potential Good    PT Frequency 2x / week    PT Duration 8 weeks    PT Treatment/Interventions ADLs/Self Care Home Management;Aquatic Therapy;Electrical Stimulation;Cryotherapy;Iontophoresis 4mg /ml Dexamethasone;Moist Heat;Ultrasound;DME Instruction;Gait training;Stair training;Functional mobility training;Therapeutic activities;Therapeutic exercise;Balance training;Neuromuscular re-education;Manual techniques;Patient/family education;Passive range of motion;Dry needling;Taping    PT Next Visit Plan Assess response to HEP. Continue core, bilat hip and knee strengthening. Continue to work on endurance.    PT Home Exercise Plan Access Code: [email or web address]    Consulted and Agree with Plan of Care Patient;Family member/caregiver    Family Member Consulted Daughter,             Patient will benefit from skilled therapeutic intervention in order to improve the following deficits and impairments:  Abnormal gait, Difficulty walking, Decreased endurance, Decreased activity tolerance, Pain, Decreased balance, Impaired flexibility, Improper body mechanics, Decreased mobility, Decreased strength, Postural dysfunction  Visit Diagnosis: Muscle weakness (generalized)  Difficulty in walking, not elsewhere classified  Abnormal posture  Other abnormalities of gait and mobility     Problem List Patient Active Problem List   Diagnosis Date Noted   Multinodular goiter 08/20/2020   Acquired hypothyroidism 08/20/2020   Idiopathic gout of multiple sites 06/27/2020   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    Vitamin D deficiency 05/01/2020   Mammogram abnormal 05/01/2020   Proteinuria 05/01/2020   Other bilateral secondary osteoarthritis of knee 05/01/2020   Osteoarthritis of hip 05/01/2020   HTN (hypertension) 05/01/2020   GERD without esophagitis 05/01/2020   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HCC) 05/01/2020   Cardiomyopathy (HCC) 05/01/2020   Iatrogenic hypotension 05/01/2020   Hypercoagulability due to atrial fibrillation (HCC) 05/01/2020   Systemic lupus erythematosus (HCC) 05/01/2020   Chronic heart failure with preserved ejection fraction (HCC) 05/01/2020   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   Paroxysmal atrial fibrillation with RVR (HCC) 05/01/2020   Protein calorie malnutrition (HCC) 05/01/2020   Chronic kidney disease (CKD), active medical management without dialysis, stage 3 (moderate) (HCC) 05/01/2020   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HCC) 05/01/2020   Non-rheumatic atrial fibrillation (HCC) 05/01/2020   Atherosclerosis of abdominal aorta (HCC) 05/01/2020   Diverticulosis of colon 05/01/2020   Cholelithiasis 05/01/2020   Secondary adrenal  insufficiency (HCC) 05/01/2020   Anemia of chronic disease 05/01/2020    [facility] April Dell [place], [place], DPT 11/12/2020, 6:07 PM  [facility] [address] [place], [place], [address] Phone: [phone]   Fax:  [phone]  Name: Aletheia Tangredi MRN: [id number] Date of Birth: Aug 24, 1945

## 2020-11-18 ENCOUNTER — Ambulatory Visit (INDEPENDENT_AMBULATORY_CARE_PROVIDER_SITE_OTHER): Payer: Medicare HMO

## 2020-11-18 DIAGNOSIS — I429 Cardiomyopathy, unspecified: Secondary | ICD-10-CM | POA: Diagnosis not present

## 2020-11-18 LAB — CUP PACEART REMOTE DEVICE CHECK
Battery Remaining Longevity: 76 mo
Battery Voltage: 2.99 V
Brady Statistic RV Percent Paced: 99.4 %
Date Time Interrogation Session: 20221025001603
HighPow Impedance: 48 Ohm
Implantable Lead Implant Date: 20120920
Implantable Lead Location: 753862
Implantable Pulse Generator Implant Date: 20190809
Lead Channel Impedance Value: 285 Ohm
Lead Channel Impedance Value: 304 Ohm
Lead Channel Pacing Threshold Amplitude: 0.75 V
Lead Channel Pacing Threshold Pulse Width: 0.4 ms
Lead Channel Sensing Intrinsic Amplitude: 6.875 mV
Lead Channel Sensing Intrinsic Amplitude: 6.875 mV
Lead Channel Setting Pacing Amplitude: 1.5 V
Lead Channel Setting Pacing Pulse Width: 0.4 ms
Lead Channel Setting Sensing Sensitivity: 0.3 mV

## 2020-11-19 ENCOUNTER — Ambulatory Visit: Payer: Medicare HMO | Admitting: Internal Medicine

## 2020-11-19 ENCOUNTER — Other Ambulatory Visit: Payer: Self-pay

## 2020-11-19 ENCOUNTER — Ambulatory Visit: Payer: Medicare HMO | Admitting: Physical Therapy

## 2020-11-19 DIAGNOSIS — M6281 Muscle weakness (generalized): Secondary | ICD-10-CM

## 2020-11-19 DIAGNOSIS — R262 Difficulty in walking, not elsewhere classified: Secondary | ICD-10-CM

## 2020-11-19 DIAGNOSIS — R2689 Other abnormalities of gait and mobility: Secondary | ICD-10-CM

## 2020-11-19 DIAGNOSIS — R293 Abnormal posture: Secondary | ICD-10-CM

## 2020-11-19 NOTE — Therapy (Signed)
[facility] [address] [place], [place], [address] Phone: [phone]   Fax:  [phone]  Physical Therapy Treatment  Patient Details  Name: Lindsey Huber MRN: [id number] Date of Birth: [birth year] No data recorded  Encounter Date: 11/19/2020   PT End of Session - 11/19/20 1748     Visit Number 2    Number of Visits 16    Date for PT Re-Evaluation 01/07/21    Authorization Type A[employer]na Medicare    PT Start Time 1712   arrived late   PT Stop Time 1743    PT Time Calculation (min) 31 min    Activity Tolerance Patient tolerated treatment well    Behavior During Therapy WFL for tasks assessed/performed             Past Medical History:  Diagnosis Date   A-fib (HCC)    Anemia    Cardiomyopathy (HCC)    CHF (congestive heart failure) (HCC)    CKD (chronic kidney disease)    Diverticulosis    Hypertension    Lupus (HCC)    Osteoarthritis    Pulmonary fibrosis (HCC)    Vitamin D deficiency     Past Surgical History:  Procedure Laterality Date   CARDIAC VALVE SURGERY     CARPAL TUNNEL RELEASE     HEMORRHOID SURGERY     PACEMAKER INSERTION     TONSILLECTOMY      There were no vitals filed for this visit.   Subjective Assessment - 11/19/20 1714     Subjective I'm a little sore in my hips and knees, its normal for me and this is worse some days/not so bad others. Standing is hard for me to do, as is getting up. No falls or close calls but has been stumbling a bit. I get tired easily.    Patient is accompained by: Family member    Pertinent History A-fib, anemia, CHF, CKD, gout, HTN, OA, pulmonary fibrosis, lupus    Currently in Pain? Yes    Pain Score 6     Pain Location Knee   and hips   Pain Orientation Right;Left    Pain Descriptors / Indicators Sore    Pain Type Chronic pain    Aggravating Factors  standing/movement    Pain Relieving Factors stretching                                [facility] Adult PT Treatment/Exercise - 11/19/20 0001       Knee/Hip Exercises: Seated   Other Seated Knee/Hip Exercises sitting on air pad with no UE/LE support- external perturbations multidirectional for core sterngth; seated bicep curls on air pad without BLE support 2 sets (superset) 3#     Knee/Hip Exercises: Supine   Bridges Strengthening;Both;2 sets;10 reps   superset   Other Supine Knee/Hip Exercises supine marches 2x20 (superset)            Gait interval training x2 rounds      Balance Exercises - 11/19/20 0001       Balance Exercises: Standing   Standing Eyes Closed Foam/compliant surface   60 seconds   Heel Raises 15 reps;Other (comment)   on blue foam pad   Other Standing Exercises narrow BOS with horizontal and vertical head turns                PT Education - 11/19/20 1748  Education Details exercise form and purpose    Person(s) Educated Patient;Child(ren)    Methods Explanation    Comprehension Verbalized understanding              PT Short Term Goals - 11/12/20 1758       PT SHORT TERM GOAL #1   Title Pt will be independent with initial HEP    Time 4    Period Weeks    Status New    Target Date 12/10/20      PT SHORT TERM GOAL #2   Title Pt will be able to tolerate walking at least 6 minutes to demo improving endurance    Time 6    Period Weeks    Status New    Target Date 12/24/20      PT SHORT TERM GOAL #3   Title Pt will be able to perform 1 sit to stand without UE support    Time 4    Period Weeks    Status New    Target Date 12/10/20               PT Long Term Goals - 11/12/20 1759       PT LONG TERM GOAL #1   Title Pt will be independent with advanced HEP    Time 8    Period Weeks    Status New    Target Date 01/07/21      PT LONG TERM GOAL #2   Title Pt will be able to walk at least 12 laps around the gym within 6 min to demo improved endurance    Baseline only  able to walk 7 laps in 3 min 54 sec    Time 8    Period Weeks    Status New    Target Date 01/07/21      PT LONG TERM GOAL #3   Title Pt will be able to perform 5x STS without UE support in </=20 sec    Baseline 46 sec with bilat UE support    Time 8    Period Weeks    Status New    Target Date 01/07/21      PT LONG TERM GOAL #4   Title Pt will report decrease in pain by at least 25%    Time 8    Period Weeks    Status New    Target Date 01/07/21                   Plan - 11/19/20 1748     Clinical Impression Statement Lindsey Huber arrived a bit late today, but regardless motivated and willing to work with therapy. Reports her main concern is weakness and being fatigued, has had some stumbles at home. Spent time working on [employer] for strengthening/endurance as well as a bit of balance training and gait intervals today. Able to complete first gait interval in 105 seconds on first attempt with cane, and in 76 seconds on second attempt with no cane. Does well with gentle encouragement. Will continue to benefit from ongoing skilled PT- continue POC.    Personal Factors and Comorbidities Age;Fitness;Time since onset of injury/illness/exacerbation    Examination-Activity Limitations Squat;Stairs;Locomotion Level;Transfers    Examination-Participation Restrictions Community Activity;Shop;Meal Prep;Cleaning    Stability/Clinical Decision Making Stable/Uncomplicated    Rehab Potential Good    PT Frequency 2x / week    PT Duration 8 weeks    PT Treatment/Interventions ADLs/Self Care Home Management;Aquatic Therapy;Electrical Stimulation;Cryotherapy;Iontophoresis  4mg /ml Dexamethasone;Moist Heat;Ultrasound;DME Instruction;Gait training;Stair training;Functional mobility training;Therapeutic activities;Therapeutic exercise;Balance training;Neuromuscular re-education;Manual techniques;Patient/family education;Passive range of motion;Dry needling;Taping    PT Next Visit Plan continue  core, BLE strengthening and endurance training; also incorporate balance training. Try interval training on [employer]??    PT Home Exercise Plan Access Code: [email or web address]    Consulted and Agree with Plan of Care Patient;Family member/caregiver    Family Member Consulted Daughter,             Patient will benefit from skilled therapeutic intervention in order to improve the following deficits and impairments:  Abnormal gait, Difficulty walking, Decreased endurance, Decreased activity tolerance, Pain, Decreased balance, Impaired flexibility, Improper body mechanics, Decreased mobility, Decreased strength, Postural dysfunction  Visit Diagnosis: Muscle weakness (generalized)  Difficulty in walking, not elsewhere classified  Abnormal posture  Other abnormalities of gait and mobility     Problem List Patient Active Problem List   Diagnosis Date Noted   Multinodular goiter 08/20/2020   Acquired hypothyroidism 08/20/2020   Idiopathic gout of multiple sites 06/27/2020   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   Vitamin D deficiency 05/01/2020   Mammogram abnormal 05/01/2020   Proteinuria 05/01/2020   Other bilateral secondary osteoarthritis of knee 05/01/2020   Osteoarthritis of hip 05/01/2020   HTN (hypertension) 05/01/2020   GERD without esophagitis 05/01/2020   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HCC) 05/01/2020   Cardiomyopathy (HCC) 05/01/2020   Iatrogenic hypotension 05/01/2020   Hypercoagulability due to atrial fibrillation (HCC) 05/01/2020   Systemic lupus erythematosus (HCC) 05/01/2020   Chronic heart failure with preserved ejection fraction (HCC) 05/01/2020   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   Paroxysmal atrial fibrillation with RVR (HCC) 05/01/2020   Protein calorie malnutrition (HCC) 05/01/2020   Chronic kidney disease (CKD), active medical management without dialysis, stage 3 (moderate) (HCC) 05/01/2020   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HCC) 05/01/2020   Non-rheumatic atrial fibrillation (HCC) 05/01/2020   Atherosclerosis of abdominal aorta (HCC) 05/01/2020   Diverticulosis of colon 05/01/2020   Cholelithiasis 05/01/2020   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   Anemia of chronic disease 05/01/2020   07/01/2020 PT, DPT, PN2   Supplemental Physical Therapist [facility]    Pager [phone] Acute Rehab Office [phone]   [facility] [address] [place], [place], [place] Phone: [phone]   Fax:  [phone]  Name: Lindsey Huber MRN: [name] Date of Birth: [birth year]

## 2020-11-21 ENCOUNTER — Other Ambulatory Visit: Payer: Self-pay

## 2020-11-21 ENCOUNTER — Ambulatory Visit: Payer: Medicare HMO | Admitting: Physical Therapy

## 2020-11-21 ENCOUNTER — Encounter: Payer: Self-pay | Admitting: Physical Therapy

## 2020-11-21 DIAGNOSIS — R262 Difficulty in walking, not elsewhere classified: Secondary | ICD-10-CM

## 2020-11-21 DIAGNOSIS — R293 Abnormal posture: Secondary | ICD-10-CM

## 2020-11-21 DIAGNOSIS — M6281 Muscle weakness (generalized): Secondary | ICD-10-CM

## 2020-11-21 DIAGNOSIS — R2689 Other abnormalities of gait and mobility: Secondary | ICD-10-CM

## 2020-11-21 NOTE — Therapy (Signed)
[facility] [place], [place], [address] Phone: [phone]   Fax:  [phone]  Physical Therapy Treatment  Patient Details  Name: Lindsey Huber MRN: [id number] Date of Birth: 1945-11-27 No data recorded  Encounter Date: 11/21/2020   PT End of Session - 11/21/20 1146     Visit Number 3    Number of Visits 16    Date for PT Re-Evaluation 01/07/21    Authorization Type [employer]    PT Start Time 1104    PT Stop Time 1147    PT Time Calculation (min) 43 min    Activity Tolerance Patient tolerated treatment well    Behavior During Therapy WFL for tasks assessed/performed             Past Medical History:  Diagnosis Date   A-fib ([place])    Anemia    Cardiomyopathy ([place])    CHF (congestive heart failure) ([place])    CKD (chronic kidney disease)    Diverticulosis    Hypertension    Lupus ([place])    Osteoarthritis    Pulmonary fibrosis ([place])    Vitamin D deficiency     Past Surgical History:  Procedure Laterality Date   CARDIAC VALVE [place]      There were no vitals filed for this visit.   Subjective Assessment - 11/21/20 1111     Subjective "The cold weather has affected me, sore in hips and knees. But I have to keep going."    Patient is accompained by: Family member    Pertinent History A-fib, anemia, CHF, CKD, gout, HTN, OA, pulmonary fibrosis, lupus    Limitations Standing;Walking;Lifting    How long can you sit comfortably? no issues    How long can you stand comfortably? ~1 hr    How long can you walk comfortably? ~1 hr    Patient Stated Goals Increase strength, walk without getting so out of breath    Currently in Pain? Yes    Pain Score 4     Pain Location Hip    Pain Orientation Right;Left    Pain Descriptors / Indicators Aching    Pain Type Chronic pain    Pain Onset More than a month  ago    Pain Frequency Constant    Aggravating Factors  Cold weather.                               [facility] Adult PT Treatment/Exercise - 11/21/20 0001       Knee/Hip Exercises: Aerobic   Nustep L3 x 5 minutes      Knee/Hip Exercises: Standing   Other Standing Knee Exercises standing rotation and reach to each side, CGA, x 5 each direction.      Knee/Hip Exercises: Seated   Other Seated Knee/Hip Exercises Sit on side of mat, rotate to R, place both hands on the mat, walk hands out away from trunk, initiating weight shift over BOS, return to center. 5 x to each side.    Sit to [employer] Other (comment)   Patient struggled with sit to stand wihtout UE support, even with elevated mat, so tried mini suats, which also proved difficult.     Knee/Hip Exercises: Supine   Bridges Strengthening;Both;2 sets;10 reps;1 set  Bridges Limitations over ball    Straight Leg Raises Strengthening;Both;10 reps    Other Supine Knee/Hip Exercises Supine with legs over ball, rol ups and lower trunk rotation. 10 reps of each.      Knee/Hip Exercises: Sidelying   Hip ABduction Strengthening;Both;2 sets;10 reps    Hip ABduction Limitations at the same time, against red T band resistance.                     PT Education - 11/21/20 1145     Education Details Facilitated upright posture during exercises.    Person(s) Educated Patient    Methods Explanation;Demonstration    Comprehension Verbalized understanding;Returned demonstration              PT Short Term Goals - 11/21/20 1124       PT SHORT TERM GOAL #1   Title Pt will be independent with initial HEP    Time 3    Period Weeks    Status On-going    Target Date 12/10/20      PT SHORT TERM GOAL #2   Title Pt will be able to tolerate walking at least 6 minutes to demo improving endurance    Time 5    Period Weeks    Status On-going    Target Date 12/24/20      PT SHORT TERM GOAL #3   Title Pt will be able  to perform 1 sit to stand without UE support    Baseline unable    Time 3    Period Weeks    Status Not Met    Target Date 12/10/20               PT Long Term Goals - 11/12/20 1759       PT LONG TERM GOAL #1   Title Pt will be independent with advanced HEP    Time 8    Period Weeks    Status New    Target Date 01/07/21      PT LONG TERM GOAL #2   Title Pt will be able to walk at least 12 laps around the gym within 6 min to demo improved endurance    Baseline only able to walk 7 laps in 3 min 54 sec    Time 8    Period Weeks    Status New    Target Date 01/07/21      PT LONG TERM GOAL #3   Title Pt will be able to perform 5x STS without UE support in </=20 sec    Baseline 46 sec with bilat UE support    Time 8    Period Weeks    Status New    Target Date 01/07/21      PT LONG TERM GOAL #4   Title Pt will report decrease in pain by at least 25%    Time 8    Period Weeks    Status New    Target Date 01/07/21                   Plan - 11/21/20 1147     Clinical Impression Statement Patient particiapted well. Treatment focused on trunk and LE strength for functional moibity and postural contorl. Also utilized Nu-Step to facilitate activitiy tolerance, endurance, cardiovascular training, tolerated 5 minutes.    Personal Factors and Comorbidities Age;Fitness;Time since onset of injury/illness/exacerbation    Examination-Activity Limitations Squat;Stairs;Locomotion Level;Transfers    Examination-Participation Restrictions [employer]  Activity;Shop;Meal Prep;Cleaning    Stability/Clinical Decision Making Stable/Uncomplicated    Clinical Decision Making Low    Rehab Potential Good    PT Frequency 2x / week    PT Duration 8 weeks    PT Treatment/Interventions ADLs/Self Care Home Management;Aquatic Therapy;Electrical Stimulation;Cryotherapy;Iontophoresis 59m/ml Dexamethasone;Moist Heat;Ultrasound;DME Instruction;Gait training;Stair training;Functional mobility  training;Therapeutic activities;Therapeutic exercise;Balance training;Neuromuscular re-education;Manual techniques;Patient/family education;Passive range of motion;Dry needling;Taping    PT Next Visit Plan continue core, BLE strengthening and endurance training; also incorporate balance training. Try interval training on Nustep??    PT Home Exercise Plan Access Code: [email or web address]    Consulted and Agree with Plan of Care Patient;Family member/caregiver    Family Member Consulted Daughter, MSharyn Lull            Patient will benefit from skilled therapeutic intervention in order to improve the following deficits and impairments:  Abnormal gait, Difficulty walking, Decreased endurance, Decreased activity tolerance, Pain, Decreased balance, Impaired flexibility, Improper body mechanics, Decreased mobility, Decreased strength, Postural dysfunction  Visit Diagnosis: Muscle weakness (generalized)  Difficulty in walking, not elsewhere classified  Abnormal posture  Other abnormalities of gait and mobility     Problem List Patient Active Problem List   Diagnosis Date Noted   Multinodular goiter 08/20/2020   Acquired hypothyroidism 08/20/2020   Idiopathic gout of multiple sites 06/27/2020   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   Vitamin D deficiency 05/01/2020   Mammogram abnormal 05/01/2020   Proteinuria 05/01/2020   Other bilateral secondary osteoarthritis of knee 05/01/2020   Osteoarthritis of hip 05/01/2020   HTN (hypertension) 05/01/2020   GERD without esophagitis 05/01/2020   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[place] 05/01/2020   Cardiomyopathy ([facility] 05/01/2020   Iatrogenic hypotension 05/01/2020   Hypercoagulability due to atrial fibrillation ([facility] 05/01/2020   Systemic lupus erythematosus ([facility] 05/01/2020   Chronic heart failure with preserved ejection fraction ([facility] 05/01/2020   Mixed connective tissue disease ([place] 05/01/2020   Paroxysmal atrial fibrillation with RVR ([facility]  05/01/2020   Protein calorie malnutrition ([facility] 05/01/2020   Chronic kidney disease (CKD), active medical management without dialysis, stage 3 (moderate) ([facility] 05/01/2020   Secondary hyperaldosteronism ([facility] 05/01/2020   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[facility] 05/01/2020   Non-rheumatic atrial fibrillation ([facility] 05/01/2020   Atherosclerosis of abdominal aorta ([facility] 05/01/2020   Diverticulosis of colon 05/01/2020   Cholelithiasis 05/01/2020   Secondary adrenal insufficiency ([facility] 05/01/2020   Anemia of chronic disease 05/01/2020    SMarcelina Morel DPT 11/21/2020, 11:50 AM  [place] [id number]: [phone]  Fax:  [phone] Name: Lindsey BoldingMRN: [id number] of Birth: [birth year]

## 2020-11-25 ENCOUNTER — Telehealth (HOSPITAL_COMMUNITY): Payer: Self-pay | Admitting: *Deleted

## 2020-11-25 ENCOUNTER — Other Ambulatory Visit (HOSPITAL_COMMUNITY): Payer: Self-pay | Admitting: *Deleted

## 2020-11-25 DIAGNOSIS — I5032 Chronic diastolic (congestive) heart failure: Secondary | ICD-10-CM

## 2020-11-25 DIAGNOSIS — I340 Nonrheumatic mitral (valve) insufficiency: Secondary | ICD-10-CM

## 2020-11-25 NOTE — Telephone Encounter (Signed)
Spoke with pts daughter cath instructions given.  Hold xarelto the night before, hold lasix the morning of procedure, nothing to eat or drink after midnight except a sip of water with medications. Arrive at [facility] at 10am for 12pm cath.

## 2020-11-26 ENCOUNTER — Ambulatory Visit: Payer: Medicare HMO | Admitting: Internal Medicine

## 2020-11-26 NOTE — Progress Notes (Signed)
Remote ICD transmission.   

## 2020-11-27 ENCOUNTER — Ambulatory Visit (HOSPITAL_COMMUNITY)
Admission: RE | Disposition: A | Discharge status: Home / Self Care | Payer: Self-pay | Source: Home / Self Care | Attending: Internal Medicine

## 2020-11-27 ENCOUNTER — Ambulatory Visit (HOSPITAL_COMMUNITY)
Admission: RE | Admit: 2020-11-27 | Discharge: 2020-11-27 | Disposition: A | Discharge status: Home / Self Care | Payer: Medicare HMO | Attending: Internal Medicine | Admitting: Internal Medicine

## 2020-11-27 DIAGNOSIS — Z888 Allergy status to other drugs, medicaments and biological substances status: Secondary | ICD-10-CM | POA: Insufficient documentation

## 2020-11-27 DIAGNOSIS — I5032 Chronic diastolic (congestive) heart failure: Secondary | ICD-10-CM | POA: Insufficient documentation

## 2020-11-27 DIAGNOSIS — I340 Nonrheumatic mitral (valve) insufficiency: Secondary | ICD-10-CM | POA: Insufficient documentation

## 2020-11-27 DIAGNOSIS — G4733 Obstructive sleep apnea (adult) (pediatric): Secondary | ICD-10-CM | POA: Diagnosis not present

## 2020-11-27 DIAGNOSIS — Z95810 Presence of automatic (implantable) cardiac defibrillator: Secondary | ICD-10-CM | POA: Insufficient documentation

## 2020-11-27 DIAGNOSIS — M3214 Glomerular disease in systemic lupus erythematosus: Secondary | ICD-10-CM | POA: Insufficient documentation

## 2020-11-27 DIAGNOSIS — J8410 Pulmonary fibrosis, unspecified: Secondary | ICD-10-CM | POA: Insufficient documentation

## 2020-11-27 DIAGNOSIS — I272 Pulmonary hypertension, unspecified: Secondary | ICD-10-CM | POA: Insufficient documentation

## 2020-11-27 DIAGNOSIS — I130 Hypertensive heart and chronic kidney disease with heart failure and stage 1 through stage 4 chronic kidney disease, or unspecified chronic kidney disease: Secondary | ICD-10-CM | POA: Diagnosis present

## 2020-11-27 DIAGNOSIS — Z953 Presence of xenogenic heart valve: Secondary | ICD-10-CM | POA: Diagnosis not present

## 2020-11-27 DIAGNOSIS — I4821 Permanent atrial fibrillation: Secondary | ICD-10-CM | POA: Diagnosis not present

## 2020-11-27 DIAGNOSIS — I495 Sick sinus syndrome: Secondary | ICD-10-CM | POA: Insufficient documentation

## 2020-11-27 DIAGNOSIS — E2749 Other adrenocortical insufficiency: Secondary | ICD-10-CM | POA: Diagnosis not present

## 2020-11-27 HISTORY — PX: RIGHT/LEFT HEART CATH AND CORONARY ANGIOGRAPHY: CATH118266

## 2020-11-27 LAB — BASIC METABOLIC PANEL
Anion gap: 8 (ref 5–15)
BUN: 20 mg/dL (ref 8–23)
CO2: 30 mmol/L (ref 22–32)
Calcium: 9.2 mg/dL (ref 8.9–10.3)
Chloride: 100 mmol/L (ref 98–111)
Creatinine, Ser: 1.05 mg/dL — ABNORMAL HIGH (ref 0.44–1.00)
GFR, Estimated: 56 mL/min — ABNORMAL LOW (ref >60)
Glucose, Bld: 80 mg/dL (ref 70–99)
Potassium: 4 mmol/L (ref 3.5–5.1)
Sodium: 138 mmol/L (ref 135–145)

## 2020-11-27 LAB — CBC
HCT: 38 % (ref 36.0–46.0)
Hemoglobin: 12.6 g/dL (ref 12.0–15.0)
MCH: 33.1 pg (ref 26.0–34.0)
MCHC: 33.2 g/dL (ref 30.0–36.0)
MCV: 99.7 fL (ref 80.0–100.0)
Platelets: 142 10*3/uL — ABNORMAL LOW (ref 150–400)
RBC: 3.81 MIL/uL — ABNORMAL LOW (ref 3.87–5.11)
RDW: 13.4 % (ref 11.5–15.5)
WBC: 6.8 10*3/uL (ref 4.0–10.5)
nRBC: 0 % (ref 0.0–0.2)

## 2020-11-27 LAB — POCT I-STAT EG7
Acid-Base Excess: 4 mmol/L — ABNORMAL HIGH (ref 0.0–2.0)
Acid-Base Excess: 0 mmol/L (ref 0.0–2.0)
Acid-Base Excess: 3 mmol/L — ABNORMAL HIGH (ref 0.0–2.0)
Acid-Base Excess: 6 mmol/L — ABNORMAL HIGH (ref 0.0–2.0)
Bicarbonate: 28.9 mmol/L — ABNORMAL HIGH (ref 20.0–28.0)
Bicarbonate: 26.2 mmol/L (ref 20.0–28.0)
Bicarbonate: 27.9 mmol/L (ref 20.0–28.0)
Bicarbonate: 32.1 mmol/L — ABNORMAL HIGH (ref 20.0–28.0)
Calcium, Ion: 1.07 mmol/L — ABNORMAL LOW (ref 1.15–1.40)
Calcium, Ion: 1 mmol/L — ABNORMAL LOW (ref 1.15–1.40)
Calcium, Ion: 1.07 mmol/L — ABNORMAL LOW (ref 1.15–1.40)
Calcium, Ion: 1.19 mmol/L (ref 1.15–1.40)
HCT: 33 % — ABNORMAL LOW (ref 36.0–46.0)
HCT: 30 % — ABNORMAL LOW (ref 36.0–46.0)
HCT: 31 % — ABNORMAL LOW (ref 36.0–46.0)
HCT: 34 % — ABNORMAL LOW (ref 36.0–46.0)
Hemoglobin: 11.2 g/dL — ABNORMAL LOW (ref 12.0–15.0)
Hemoglobin: 10.2 g/dL — ABNORMAL LOW (ref 12.0–15.0)
Hemoglobin: 10.5 g/dL — ABNORMAL LOW (ref 12.0–15.0)
Hemoglobin: 11.6 g/dL — ABNORMAL LOW (ref 12.0–15.0)
O2 Saturation: 96 %
O2 Saturation: 75 %
O2 Saturation: 76 %
O2 Saturation: 82 %
Potassium: 3 mmol/L — ABNORMAL LOW (ref 3.5–5.1)
Potassium: 2.7 mmol/L — CL (ref 3.5–5.1)
Potassium: 2.8 mmol/L — ABNORMAL LOW (ref 3.5–5.1)
Potassium: 3.3 mmol/L — ABNORMAL LOW (ref 3.5–5.1)
Sodium: 142 mmol/L (ref 135–145)
Sodium: 134 mmol/L — ABNORMAL LOW (ref 135–145)
Sodium: 140 mmol/L (ref 135–145)
Sodium: 143 mmol/L (ref 135–145)
TCO2: 30 mmol/L (ref 22–32)
TCO2: 28 mmol/L (ref 22–32)
TCO2: 29 mmol/L (ref 22–32)
TCO2: 34 mmol/L — ABNORMAL HIGH (ref 22–32)
pCO2, Ven: 43.6 mmHg — ABNORMAL LOW (ref 44.0–60.0)
pCO2, Ven: 46 mmHg (ref 44.0–60.0)
pCO2, Ven: 50.4 mmHg (ref 44.0–60.0)
pCO2, Ven: 51 mmHg (ref 44.0–60.0)
pH, Ven: 7.43 (ref 7.250–7.430)
pH, Ven: 7.324 (ref 7.250–7.430)
pH, Ven: 7.391 (ref 7.250–7.430)
pH, Ven: 7.407 (ref 7.250–7.430)
pO2, Ven: 81 mmHg — ABNORMAL HIGH (ref 32.0–45.0)
pO2, Ven: 40 mmHg (ref 32.0–45.0)
pO2, Ven: 42 mmHg (ref 32.0–45.0)
pO2, Ven: 51 mmHg — ABNORMAL HIGH (ref 32.0–45.0)

## 2020-11-27 SURGERY — RIGHT/LEFT HEART CATH AND CORONARY ANGIOGRAPHY
Anesthesia: LOCAL

## 2020-11-27 MED ORDER — SODIUM CHLORIDE 0.9 % IV SOLN: INTRAVENOUS | Status: DC

## 2020-11-27 MED ORDER — ACETAMINOPHEN 325 MG PO TABS: 650.0000 mg | ORAL_TABLET | ORAL | Status: DC | PRN

## 2020-11-27 MED ORDER — HEPARIN SODIUM (PORCINE) 1000 UNIT/ML IJ SOLN
INTRAMUSCULAR | Status: DC | PRN
  Administered 2020-11-27: 2500 [IU] via INTRAVENOUS

## 2020-11-27 MED ORDER — IOHEXOL 350 MG/ML SOLN
INTRAVENOUS | Status: DC | PRN
  Administered 2020-11-27: 55 mL

## 2020-11-27 MED ORDER — LIDOCAINE HCL (PF) 1 % IJ SOLN
INTRAMUSCULAR | Status: DC | PRN
  Administered 2020-11-27: 5 mL

## 2020-11-27 MED ORDER — SODIUM CHLORIDE 0.9 % IV SOLN: 250.0000 mL | INTRAVENOUS | Status: DC | PRN

## 2020-11-27 MED ORDER — ASPIRIN 81 MG PO CHEW
81.0000 mg | CHEWABLE_TABLET | ORAL | Status: AC
  Administered 2020-11-27: 81 mg via ORAL
  Filled 2020-11-27: qty 1

## 2020-11-27 MED ORDER — SODIUM CHLORIDE 0.9% FLUSH: 3.0000 mL | INTRAVENOUS | Status: DC | PRN

## 2020-11-27 MED ORDER — SODIUM CHLORIDE 0.9% FLUSH: 3.0000 mL | Freq: Two times a day (BID) | INTRAVENOUS | Status: DC

## 2020-11-27 MED ORDER — HEPARIN (PORCINE) IN NACL 1000-0.9 UT/500ML-% IV SOLN
INTRAVENOUS | Status: AC
  Filled 2020-11-27: qty 500

## 2020-11-27 MED ORDER — LABETALOL HCL 5 MG/ML IV SOLN: 10.0000 mg | INTRAVENOUS | Status: DC | PRN

## 2020-11-27 MED ORDER — VERAPAMIL HCL 2.5 MG/ML IV SOLN
INTRAVENOUS | Status: AC
  Filled 2020-11-27: qty 2

## 2020-11-27 MED ORDER — LIDOCAINE HCL (PF) 1 % IJ SOLN
INTRAMUSCULAR | Status: AC
  Filled 2020-11-27: qty 30

## 2020-11-27 MED ORDER — VERAPAMIL HCL 2.5 MG/ML IV SOLN
INTRAVENOUS | Status: DC | PRN
  Administered 2020-11-27: 10 mL via INTRA_ARTERIAL

## 2020-11-27 MED ORDER — HEPARIN (PORCINE) IN NACL 1000-0.9 UT/500ML-% IV SOLN
INTRAVENOUS | Status: DC | PRN
  Administered 2020-11-27 (×2): 500 mL

## 2020-11-27 MED ORDER — HEPARIN SODIUM (PORCINE) 1000 UNIT/ML IJ SOLN
INTRAMUSCULAR | Status: AC
  Filled 2020-11-27: qty 1

## 2020-11-27 MED ORDER — MIDAZOLAM HCL 2 MG/2ML IJ SOLN
INTRAMUSCULAR | Status: AC
  Filled 2020-11-27: qty 2

## 2020-11-27 MED ORDER — MIDAZOLAM HCL 2 MG/2ML IJ SOLN
INTRAMUSCULAR | Status: DC | PRN
  Administered 2020-11-27: 1 mg via INTRAVENOUS

## 2020-11-27 MED ORDER — ONDANSETRON HCL 4 MG/2ML IJ SOLN: 4.0000 mg | Freq: Four times a day (QID) | INTRAMUSCULAR | Status: DC | PRN

## 2020-11-27 MED ORDER — HYDRALAZINE HCL 20 MG/ML IJ SOLN: 10.0000 mg | INTRAMUSCULAR | Status: DC | PRN

## 2020-11-27 SURGICAL SUPPLY — 12 items
CATH BALLN WEDGE 5F 110CM (CATHETERS) ×2 IMPLANT
CATH INFINITI 5 FR JL3.5 (CATHETERS) ×2 IMPLANT
CATH INFINITI MULTIPACK ANG 4F (CATHETERS) ×2 IMPLANT
DEVICE RAD COMP TR BAND LRG (VASCULAR PRODUCTS) ×2 IMPLANT
GLIDESHEATH SLEND SS 6F .021 (SHEATH) ×2 IMPLANT
GUIDEWIRE .025 260CM (WIRE) ×2 IMPLANT
GUIDEWIRE INQWIRE 1.5J.035X260 (WIRE) ×1 IMPLANT
INQWIRE 1.5J .035X260CM (WIRE) ×2
PACK CARDIAC CATHETERIZATION (CUSTOM PROCEDURE TRAY) ×2 IMPLANT
SHEATH GLIDE SLENDER 4/5FR (SHEATH) ×4 IMPLANT
TRANSDUCER W/STOPCOCK (MISCELLANEOUS) ×2 IMPLANT
WIRE HI TORQ VERSACORE-J 145CM (WIRE) ×2 IMPLANT

## 2020-11-27 NOTE — H&P (Signed)
ADVANCED HF CLINIC H&P NOTE  Referring Physician: Dr. Quentin Ore Primary Care: Sabra Heck, [place] Primary Cardiologist: EP Lars Mage  HPI:  75 yo female with pmh significant for chronic diastolic CHF, permanent A. fib, lupus, pulmonary fibrosis, pulmonary hypertension. Paroxysmal VT, OSA not on cpap, valvular disease Aortic, Mitral and Tricuspid, aflutter s/p ablation. Referred by Dr. Quentin Ore for further evaluation of [place] and valvular heart disease  Mid 1990's developed substantial weight loss and severe arthritis diagnosed with SLE.    Aflutter ablation ~ 2003.  Did subsequently develop afib.    History of valvular disease with multiple replaced valves AVR/MV ring/TVR 09/25/2010.  Paroxysmal VT, ICD placed due to torsades on Flecainide with SSS requiring PPM which predated valve repar/replacement surgery 09/25/2010 and in fact ICD lead was damaged during valve replacement surgery.       History of chronic COPD and pulmonary fibrosis followed by pulmonology.  On triple therapy for COPD vs asthma,  nintedanib for PF. 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  Did not smoke but inhaled second hand smoke from husband and mother pretty much her whole life until 01/2020.  Continued nintedanib switched inhaler therapy to [facility].    2016 ECHO EF 55%, moderately reduced RV function, mild MR with normally functioning valve s/p MV ring, mild TR with normal functioning bioprosthesis.  No AI w/normally functioning aortic bioprosthesis.    Echocardiogram 12/06/2018: EF 63%, pacemaker in right heart, normal bioprosthetic tricuspid and aortic valve with mild tricuspid regurgitation, mitral valve repair with mild mitral regurgitation, severe biatrial enlargement.   Admitted at [facility] for hypotension and acute kidney injury 03/10/2020.  She was placed on broad-spectrum antibiotics with initial diagnosis of sepsis, no source of infection found also required Levophed and IVF infusion to  support blood pressure, started on COPD therapy.  There was question of whether adrenal insufficiency contributing.  Steroid therapy reinstituted.   Echo 03/11/2020  LVEF 55 to 60%, moderate to severe tricuspid regurgitation, moderately elevated right ventricular systolic pressure estimate 78mm Hg, moderate mitral regurgitation.  RV not well visualized.    Seen in clinic last note more fatigued with increasing DOE. Now NYHA IIIB-IV. Concern for worsening HF and valvular disease. Arranged for R/L cath and TEE.   Review of Systems: [y] = yes, [ ]  = no   General: Weight gain [ ] ; Weight loss [ ] ; Anorexia [ ] ; Fatigue Blue.Reese ]; Fever [ ] ; Chills [ ] ; Weakness [ ]   Cardiac: Chest pain/pressure [ ] ; Resting SOB [ y]; Exertional SOB [ y]; Orthopnea [ ] ; Pedal Edema [ y]; Palpitations [ ] ; Syncope [ ] ; Presyncope [ ] ; Paroxysmal nocturnal dyspnea[ ]   Pulmonary: Cough [ y]; Wheezing[ ] ; Hemoptysis[ ] ; Sputum [ ] ; Snoring [ y]  GI: Vomiting[ ] ; Dysphagia[ ] ; Melena[ ] ; Hematochezia [ ] ; Heartburn[ ] ; Abdominal pain [ ] ; Constipation [ ] ; Diarrhea [ ] ; BRBPR [ ]   GU: Hematuria[ ] ; Dysuria [ ] ; Nocturia[ ]   Vascular: Pain in legs with walking [ ] ; Pain in feet with lying flat [ ] ; Non-healing sores [ ] ; Stroke [ ] ; TIA [ ] ; Slurred speech [ ] ;  Neuro: Headaches[ ] ; Vertigo[ ] ; Seizures[ ] ; Paresthesias[ ] ;Blurred vision [ ] ; Diplopia [ ] ; Vision changes [ ]   Ortho/Skin: Arthritis [ y]; Joint pain Blue.Reese ]; Muscle pain [ ] ; Joint swelling [ ] ; Back Pain [ ] ; Rash [ ]   Psych: Depression[ ] ; Anxiety[ ]   Heme: Bleeding problems [ ] ; Clotting disorders [ ] ;  Anemia [place] ]  Endocrine: Diabetes [ ] ; Thyroid dysfunction[ ]    Past Medical History:  Diagnosis Date   A-fib (HCC)    Anemia    Cardiomyopathy (H[facility])    CHF (congestive heart failure) (H[facility])    CKD (chronic kidney disease)    Diverticulosis    Hypertension    Lupus ([facility])    Osteoarthritis    Pulmonary fibrosis ([facility])    Vitamin D deficiency     Current  Facility-Administered Medications  Medication Dose Route Frequency Provider Last Rate Last Admin   0.9 %  sodium chloride infusion  250 mL Intravenous PRN Eddi Hymes, , MD       0.9 %  sodium chloride infusion   Intravenous Continuous Doratha Mcswain, Bevelyn Buckles, MD 10 mL/hr at 11/27/20 1030 New Bag at 11/27/20 1030   sodium chloride flush (NS) 0.9 % injection 3 mL  3 mL Intravenous Q12H Yarel Kilcrease, 13/03/22, MD       sodium chloride flush (NS) 0.9 % injection 3 mL  3 mL Intravenous PRN Gentri Guardado, Bevelyn Buckles, MD        Allergies  Allergen Reactions   Propofol Other (See Comments)    Other reaction(s): asthma, Unknown/Patient and Family Unable to Define   Flecainide Other (See Comments)    Other reaction(s): unknown   Amiodarone Other (See Comments)    Other reaction(s): Delerium/Confusion/Psychosis, unknown   Amlodipine Other (See Comments)    Other reaction(s): unknown Tired, syncope      Social History   Socioeconomic History   Marital status: Legally Separated    Spouse name: Not on file   Number of children: Not on file   Years of education: Not on file   Highest education level: Not on file  Occupational History   Not on file  Tobacco Use   Smoking status: Never   Smokeless tobacco: Never  Vaping Use   Vaping Use: Never used  Substance and Sexual Activity   Alcohol use: Never   Drug use: Never   Sexual activity: Not on file  Other Topics Concern   Not on file  Social History Narrative   Not on file   Social Determinants of Health   Financial Resource Strain: Not on file  Food Insecurity: Not on file  Transportation Needs: Not on file  Physical Activity: Not on file  Stress: Not on file  Social Connections: Not on file  Intimate Partner Violence: Not on file      Family History  Problem Relation Age of Onset   Heart disease Mother    Hypertension Mother    Rheum arthritis Mother    Osteoarthritis Mother    Heart disease Father    Hypertension Father     Osteoarthritis Father    Heart disease Sister    Diabetes Brother    Cancer Brother     Vitals:   11/27/20 1006  BP: (!) 159/69  Pulse: 72  Resp: 15  Temp: 97.8 F (36.6 C)  TempSrc: Oral  SpO2: 100%  Weight: 53.5 kg  Height: 5\' 2"  (1.575 m)    PHYSICAL EXAM: General:  Elderly. No respiratory difficulty HEENT: normal Neck: supple. JVP 8-10 with prominent v waves   Carotids 2+ bilat; no bruits. No lymphadenopathy or thryomegaly appreciated. Cor: PMI nondisplaced. Irregular rate & rhythm. 3/6 MR/TR Lungs: clear Abdomen: soft, nontender, nondistended. No hepatosplenomegaly. No bruits or masses. Good bowel sounds. Extremities: no cyanosis, clubbing, rash, edema Neuro: alert & orientedx3, cranial nerves grossly  intact. moves all 4 extremities w/o difficulty. Affect pleasant   ASSESSMENT & PLAN:  Chronic Diastolic CHF due to valvular heart disease -uncertain cause of valvular dysfunction.  Does have hx of SLE -s/p bioprosthetic AVR/MV ring/bioprosthetic TVR 09/25/2010 -progression of valvular dysfunction since 2016 ECHO EF 55%, moderately reduced RV function, mild MR with normally functioning valve s/p MV ring, mild TR with normal functioning bioprosthesis.  No AI w/normally functioning aortic bioprosthesis. -Echocardiogram 12/06/2018: EF 63%, pacemaker in right heart, normal bioprosthetic tricuspid and aortic valve with mild tricuspid regurgitation, mitral valve repair with mild mitral regurgitation, severe biatrial enlargement.  -Echo 03/11/2020  LVEF 55 to 60%, moderate to severe tricuspid regurgitation, moderately elevated right ventricular systolic pressure estimate 36mm Hg, moderate mitral regurgitation.  RV not well visualized. AV function not assessed. -Worse NYHA IIIB  -She has several potential reasons for her continued decline and symptoms. Worsening pulmonary disease, worsening valvular heart disease and worsening pulmonary HTN and any combination of all of these.  There  is a potential for CAD but less likely due to no hx in 2012. -Plan echo and R/L cath to further evaluate. Cath today  2. 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  - R/L cath today  3. ILD -11/2018 HRCT with stable ILD compared with 2018.  2018 CT Slowly progressive basal predominant, moderately severe pulmonary fibrosis. Favor NSIP -no visible PFT's on care everywhere -followed now by Dr. Silas Flood and continued on Ofev, bronchodilators  4. OSA -resolved with weight loss and cpap discontinued  5. Permanent Afib with SSS - has ICD in place due to h/o VT  -Followed by Dr. Quentin Ore, 04/2020 was 99% V pacing.  Switched to [place] for nosebleeds - Xarelto on hold for cath   6. H/o VT - ICD in palce  7. SLE -followed by Dr. Benjamine Mola, lupus felt to be under good control -continued on HCQ and she is also on 5mg  prednisone for secondary adrenal insufficiency  Glori Bickers, MD  11:33 AM

## 2020-11-28 ENCOUNTER — Encounter (HOSPITAL_COMMUNITY): Payer: Self-pay | Admitting: Internal Medicine

## 2020-11-28 MED FILL — Heparin Sod (Porcine)-NaCl IV Soln 1000 Unit/500ML-0.9%: INTRAVENOUS | Qty: 500 | Status: AC

## 2020-12-02 ENCOUNTER — Ambulatory Visit: Payer: Medicare HMO | Admitting: Internal Medicine

## 2020-12-05 ENCOUNTER — Ambulatory Visit: Payer: Medicare HMO | Admitting: Physical Therapy

## 2020-12-09 ENCOUNTER — Encounter: Payer: Medicare HMO | Admitting: Physical Therapy

## 2020-12-10 ENCOUNTER — Encounter: Payer: Medicare HMO | Admitting: Physical Therapy

## 2020-12-13 ENCOUNTER — Inpatient Hospital Stay (HOSPITAL_COMMUNITY)
Admission: EM | Admit: 2020-12-13 | Discharge: 2020-12-23 | DRG: 378 | Disposition: A | Discharge status: Home / Self Care | Payer: Medicare HMO | Attending: Internal Medicine | Admitting: Internal Medicine

## 2020-12-13 ENCOUNTER — Emergency Department (HOSPITAL_COMMUNITY): Payer: Medicare HMO

## 2020-12-13 DIAGNOSIS — Z953 Presence of xenogenic heart valve: Secondary | ICD-10-CM

## 2020-12-13 DIAGNOSIS — Z8249 Family history of ischemic heart disease and other diseases of the circulatory system: Secondary | ICD-10-CM

## 2020-12-13 DIAGNOSIS — E872 Acidosis, unspecified: Secondary | ICD-10-CM | POA: Diagnosis present

## 2020-12-13 DIAGNOSIS — E8809 Other disorders of plasma-protein metabolism, not elsewhere classified: Secondary | ICD-10-CM | POA: Diagnosis present

## 2020-12-13 DIAGNOSIS — I359 Nonrheumatic aortic valve disorder, unspecified: Secondary | ICD-10-CM | POA: Diagnosis present

## 2020-12-13 DIAGNOSIS — Z20822 Contact with and (suspected) exposure to covid-19: Secondary | ICD-10-CM | POA: Diagnosis present

## 2020-12-13 DIAGNOSIS — Z7952 Long term (current) use of systemic steroids: Secondary | ICD-10-CM

## 2020-12-13 DIAGNOSIS — K5793 Diverticulitis of intestine, part unspecified, without perforation or abscess with bleeding: Secondary | ICD-10-CM

## 2020-12-13 DIAGNOSIS — I110 Hypertensive heart disease with heart failure: Secondary | ICD-10-CM | POA: Diagnosis present

## 2020-12-13 DIAGNOSIS — J84112 Idiopathic pulmonary fibrosis: Secondary | ICD-10-CM | POA: Diagnosis not present

## 2020-12-13 DIAGNOSIS — I429 Cardiomyopathy, unspecified: Secondary | ICD-10-CM | POA: Diagnosis present

## 2020-12-13 DIAGNOSIS — E039 Hypothyroidism, unspecified: Secondary | ICD-10-CM | POA: Diagnosis present

## 2020-12-13 DIAGNOSIS — E611 Iron deficiency: Secondary | ICD-10-CM | POA: Diagnosis present

## 2020-12-13 DIAGNOSIS — I4510 Unspecified right bundle-branch block: Secondary | ICD-10-CM | POA: Diagnosis present

## 2020-12-13 DIAGNOSIS — I4821 Permanent atrial fibrillation: Secondary | ICD-10-CM | POA: Diagnosis present

## 2020-12-13 DIAGNOSIS — I480 Paroxysmal atrial fibrillation: Secondary | ICD-10-CM | POA: Diagnosis present

## 2020-12-13 DIAGNOSIS — E861 Hypovolemia: Secondary | ICD-10-CM | POA: Diagnosis present

## 2020-12-13 DIAGNOSIS — Z635 Disruption of family by separation and divorce: Secondary | ICD-10-CM

## 2020-12-13 DIAGNOSIS — K922 Gastrointestinal hemorrhage, unspecified: Secondary | ICD-10-CM | POA: Diagnosis present

## 2020-12-13 DIAGNOSIS — E876 Hypokalemia: Secondary | ICD-10-CM | POA: Diagnosis not present

## 2020-12-13 DIAGNOSIS — I495 Sick sinus syndrome: Secondary | ICD-10-CM | POA: Diagnosis present

## 2020-12-13 DIAGNOSIS — I959 Hypotension, unspecified: Secondary | ICD-10-CM | POA: Diagnosis present

## 2020-12-13 DIAGNOSIS — R079 Chest pain, unspecified: Secondary | ICD-10-CM | POA: Diagnosis present

## 2020-12-13 DIAGNOSIS — I9589 Other hypotension: Secondary | ICD-10-CM

## 2020-12-13 DIAGNOSIS — Z8679 Personal history of other diseases of the circulatory system: Secondary | ICD-10-CM

## 2020-12-13 DIAGNOSIS — E871 Hypo-osmolality and hyponatremia: Secondary | ICD-10-CM | POA: Diagnosis present

## 2020-12-13 DIAGNOSIS — E2749 Other adrenocortical insufficiency: Secondary | ICD-10-CM | POA: Diagnosis present

## 2020-12-13 DIAGNOSIS — Z809 Family history of malignant neoplasm, unspecified: Secondary | ICD-10-CM

## 2020-12-13 DIAGNOSIS — I2729 Other secondary pulmonary hypertension: Secondary | ICD-10-CM | POA: Diagnosis present

## 2020-12-13 DIAGNOSIS — I4891 Unspecified atrial fibrillation: Secondary | ICD-10-CM | POA: Diagnosis present

## 2020-12-13 DIAGNOSIS — M109 Gout, unspecified: Secondary | ICD-10-CM | POA: Diagnosis present

## 2020-12-13 DIAGNOSIS — K8020 Calculus of gallbladder without cholecystitis without obstruction: Secondary | ICD-10-CM | POA: Diagnosis present

## 2020-12-13 DIAGNOSIS — K5733 Diverticulitis of large intestine without perforation or abscess with bleeding: Secondary | ICD-10-CM | POA: Diagnosis present

## 2020-12-13 DIAGNOSIS — Z888 Allergy status to other drugs, medicaments and biological substances status: Secondary | ICD-10-CM

## 2020-12-13 DIAGNOSIS — Z86718 Personal history of other venous thrombosis and embolism: Secondary | ICD-10-CM

## 2020-12-13 DIAGNOSIS — I5082 Biventricular heart failure: Secondary | ICD-10-CM | POA: Diagnosis present

## 2020-12-13 DIAGNOSIS — G4733 Obstructive sleep apnea (adult) (pediatric): Secondary | ICD-10-CM | POA: Diagnosis present

## 2020-12-13 DIAGNOSIS — I5032 Chronic diastolic (congestive) heart failure: Secondary | ICD-10-CM | POA: Diagnosis present

## 2020-12-13 DIAGNOSIS — Z79899 Other long term (current) drug therapy: Secondary | ICD-10-CM

## 2020-12-13 DIAGNOSIS — I071 Rheumatic tricuspid insufficiency: Secondary | ICD-10-CM | POA: Diagnosis present

## 2020-12-13 DIAGNOSIS — Z833 Family history of diabetes mellitus: Secondary | ICD-10-CM

## 2020-12-13 DIAGNOSIS — M329 Systemic lupus erythematosus, unspecified: Secondary | ICD-10-CM | POA: Diagnosis present

## 2020-12-13 DIAGNOSIS — Z95810 Presence of automatic (implantable) cardiac defibrillator: Secondary | ICD-10-CM

## 2020-12-13 DIAGNOSIS — D62 Acute posthemorrhagic anemia: Secondary | ICD-10-CM | POA: Diagnosis present

## 2020-12-13 DIAGNOSIS — Z8261 Family history of arthritis: Secondary | ICD-10-CM

## 2020-12-13 DIAGNOSIS — Z7901 Long term (current) use of anticoagulants: Secondary | ICD-10-CM

## 2020-12-13 DIAGNOSIS — D696 Thrombocytopenia, unspecified: Secondary | ICD-10-CM | POA: Diagnosis present

## 2020-12-13 DIAGNOSIS — E559 Vitamin D deficiency, unspecified: Secondary | ICD-10-CM | POA: Diagnosis present

## 2020-12-13 DIAGNOSIS — Z79890 Hormone replacement therapy (postmenopausal): Secondary | ICD-10-CM

## 2020-12-13 LAB — MAGNESIUM: Magnesium: 1.6 mg/dL — ABNORMAL LOW (ref 1.7–2.4)

## 2020-12-13 LAB — I-STAT CHEM 8, ED
BUN: 15 mg/dL (ref 8–23)
Calcium, Ion: 0.9 mmol/L — ABNORMAL LOW (ref 1.15–1.40)
Chloride: 116 mmol/L — ABNORMAL HIGH (ref 98–111)
Creatinine, Ser: 0.7 mg/dL (ref 0.44–1.00)
Glucose, Bld: 87 mg/dL (ref 70–99)
HCT: 15 % — ABNORMAL LOW (ref 36.0–46.0)
Hemoglobin: 5.1 g/dL — CL (ref 12.0–15.0)
Potassium: 2.9 mmol/L — ABNORMAL LOW (ref 3.5–5.1)
Sodium: 145 mmol/L (ref 135–145)
TCO2: 19 mmol/L — ABNORMAL LOW (ref 22–32)

## 2020-12-13 LAB — PREPARE RBC (CROSSMATCH)

## 2020-12-13 LAB — COMPREHENSIVE METABOLIC PANEL WITH GFR
ALT: 17 U/L (ref 0–44)
AST: 26 U/L (ref 15–41)
Albumin: 1.8 g/dL — ABNORMAL LOW (ref 3.5–5.0)
Alkaline Phosphatase: 27 U/L — ABNORMAL LOW (ref 38–126)
Anion gap: 5 (ref 5–15)
BUN: 15 mg/dL (ref 8–23)
CO2: 17 mmol/L — ABNORMAL LOW (ref 22–32)
Calcium: 6.2 mg/dL — CL (ref 8.9–10.3)
Chloride: 118 mmol/L — ABNORMAL HIGH (ref 98–111)
Creatinine, Ser: 0.75 mg/dL (ref 0.44–1.00)
GFR, Estimated: >60 mL/min
Glucose, Bld: 92 mg/dL (ref 70–99)
Potassium: 2.8 mmol/L — ABNORMAL LOW (ref 3.5–5.1)
Sodium: 140 mmol/L (ref 135–145)
Total Bilirubin: 0.6 mg/dL (ref 0.3–1.2)
Total Protein: 4.4 g/dL — ABNORMAL LOW (ref 6.5–8.1)

## 2020-12-13 LAB — CBG MONITORING, ED: Glucose-Capillary: 109 mg/dL — ABNORMAL HIGH (ref 70–99)

## 2020-12-13 LAB — CBC WITH DIFFERENTIAL/PLATELET
Abs Immature Granulocytes: 0.01 10*3/uL (ref 0.00–0.07)
Basophils Absolute: 0 10*3/uL (ref 0.0–0.1)
Basophils Relative: 0 %
Eosinophils Absolute: 0 10*3/uL (ref 0.0–0.5)
Eosinophils Relative: 1 %
HCT: 19.2 % — ABNORMAL LOW (ref 36.0–46.0)
Hemoglobin: 6 g/dL — CL (ref 12.0–15.0)
Immature Granulocytes: 0 %
Lymphocytes Relative: 44 %
Lymphs Abs: 2.4 10*3/uL (ref 0.7–4.0)
MCH: 32.8 pg (ref 26.0–34.0)
MCHC: 31.3 g/dL (ref 30.0–36.0)
MCV: 104.9 fL — ABNORMAL HIGH (ref 80.0–100.0)
Monocytes Absolute: 0.3 10*3/uL (ref 0.1–1.0)
Monocytes Relative: 5 %
Neutro Abs: 2.7 10*3/uL (ref 1.7–7.7)
Neutrophils Relative %: 50 %
Platelets: 91 10*3/uL — ABNORMAL LOW (ref 150–400)
RBC: 1.83 MIL/uL — ABNORMAL LOW (ref 3.87–5.11)
RDW: 13.3 % (ref 11.5–15.5)
WBC: 5.5 10*3/uL (ref 4.0–10.5)
nRBC: 0 % (ref 0.0–0.2)

## 2020-12-13 LAB — ABO/RH: ABO/RH(D): A NEG

## 2020-12-13 LAB — TROPONIN I (HIGH SENSITIVITY): Troponin I (High Sensitivity): 70 ng/L — ABNORMAL HIGH (ref <18)

## 2020-12-13 LAB — BRAIN NATRIURETIC PEPTIDE: B Natriuretic Peptide: 117.6 pg/mL — ABNORMAL HIGH (ref 0.0–100.0)

## 2020-12-13 LAB — LACTIC ACID, PLASMA
Lactic Acid, Venous: 1.2 mmol/L (ref 0.5–1.9)
Lactic Acid, Venous: 1 mmol/L (ref 0.5–1.9)

## 2020-12-13 LAB — POC OCCULT BLOOD, ED: Fecal Occult Bld: POSITIVE — AB

## 2020-12-13 MED ORDER — POTASSIUM CHLORIDE 10 MEQ/100ML IV SOLN
10.0000 meq | INTRAVENOUS | Status: AC
  Administered 2020-12-14 (×2): 10 meq via INTRAVENOUS
  Filled 2020-12-13 (×2): qty 100

## 2020-12-13 MED ORDER — SODIUM CHLORIDE 0.9% IV SOLUTION: Freq: Once | INTRAVENOUS | Status: AC

## 2020-12-13 MED ORDER — POTASSIUM CHLORIDE CRYS ER 20 MEQ PO TBCR
40.0000 meq | EXTENDED_RELEASE_TABLET | Freq: Once | ORAL | Status: AC
  Administered 2020-12-13: 40 meq via ORAL
  Filled 2020-12-13: qty 2

## 2020-12-13 MED ORDER — MAGNESIUM SULFATE 2 GM/50ML IV SOLN
2.0000 g | Freq: Once | INTRAVENOUS | Status: AC
  Administered 2020-12-14: 2 g via INTRAVENOUS
  Filled 2020-12-13: qty 50

## 2020-12-13 MED ORDER — PROTHROMBIN COMPLEX CONC HUMAN 500 UNITS IV KIT
2669.0000 [IU] | PACK | Status: AC
  Administered 2020-12-13: 2669 [IU] via INTRAVENOUS
  Filled 2020-12-13: qty 506

## 2020-12-13 MED ORDER — PANTOPRAZOLE SODIUM 40 MG IV SOLR
40.0000 mg | Freq: Once | INTRAVENOUS | Status: AC
  Administered 2020-12-13: 40 mg via INTRAVENOUS
  Filled 2020-12-13: qty 40

## 2020-12-13 MED ORDER — CALCIUM GLUCONATE-NACL 1-0.675 GM/50ML-% IV SOLN
1.0000 g | Freq: Once | INTRAVENOUS | Status: AC
  Administered 2020-12-13: 1000 mg via INTRAVENOUS
  Filled 2020-12-13: qty 50

## 2020-12-13 MED ORDER — IOHEXOL 350 MG/ML SOLN
100.0000 mL | Freq: Once | INTRAVENOUS | Status: AC | PRN
  Administered 2020-12-13: 100 mL via INTRAVENOUS

## 2020-12-13 NOTE — H&P (Addendum)
History and Physical    Lindsey Huber [id number] DOB: Jan 11, 1946 DOA: 12/13/2020  PCP: Scheryl Marten, PA  Patient coming from: Home  I have personally briefly reviewed patient's old medical records in [place]  Chief Complaint: chest pain  HPI: Lindsey Huber is a 75 y.o. 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  For the past 2 days, patient is noted increasing shortness of breath with intermittent chest pain both at rest and with exertion.  Also has been feeling fatigued.  Has diffuse abdominal pain worse in left quadrants and epigastric area.  Has had diarrhea.  2 days ago she has noticed some darker stools with bright red blood.  Has history of hemorrhoidectomy.  Denies chronic NSAID use.  Patient also recently had left heart cath on 11/3 due to worsening symptoms of fatigue and dyspnea with exertion.  LHC showed normal coronary arteries, EF of 60 to 65%, very mild PAH and no significant valvular stenosis   ED Course: She presented initially with hypotension of 60 over 30s and had to be placed in Trendelenburg position.  Hemoglobin down to 6 from a prior of 10.5 several weeks ago.  FOBT positive with reportedly dark blood on rectal exam by ED PA.   Troponin pending.  EKG with ventricularly paced rhythm. K of 2.8.  Creatinine of 0.75. Calcium of 6.2, albumin 1.8.  Chest x-ray shows mild vascular congestion.  CT angio of the abdomen and later returned with likely acute diverticular hemorrhage.  GI was consulted by ED PA and is awaiting further recommendations.  1 unit of PRBC has been ordered and patient is pending transfusion although her blood pressure has significantly improved in Trendelenburg  position.   Review of Systems: Constitutional: No Weight Change, No Fever ENT/Mouth: No sore throat, No Rhinorrhea Eyes: No Eye Pain, No Vision Changes Cardiovascular: +Chest Pain, +SOB Respiratory: No Cough, No Sputum Gastrointestinal: No Nausea, No Vomiting, + Diarrhea, No Constipation, + Pain Genitourinary: +Urinary Incontinence, No Urgency, No Flank Pain Musculoskeletal: No Arthralgias, No Myalgias Skin: No Skin Lesions, No Pruritus, Neuro: no Weakness, No Numbness Psych: No Anxiety/Panic, No Depression, no decrease appetite Heme/Lymph: No Bruising, No Bleeding  Past Medical History:  Diagnosis Date   A-fib (HCC)    Anemia    Cardiomyopathy (HCC)    CHF (congestive heart failure) (HCC)    CKD (chronic kidney disease)    Diverticulosis    Hypertension    Lupus (HCC)    Osteoarthritis    Pulmonary fibrosis (HCC)    Vitamin D deficiency     Past Surgical History:  Procedure Laterality Date   CARDIAC VALVE SURGERY     CARPAL TUNNEL RELEASE     HEMORRHOID SURGERY     PACEMAKER INSERTION     RIGHT/LEFT HEART CATH AND CORONARY ANGIOGRAPHY N/A 11/27/2020   Procedure: RIGHT/LEFT HEART CATH AND CORONARY ANGIOGRAPHY;  Surgeon: Jolaine Artist, MD;  Location: [facility];  Service: Cardiovascular;  Laterality: N/A;   TONSILLECTOMY       reports that she has never smoked. She has never used smokeless tobacco. She reports that she does not drink alcohol and does not use drugs. Social History  Allergies  Allergen Reactions   Propofol Other (See Comments)    Caused asthma   Flecainide  Other (See Comments)    Unknown reaction   Amiodarone Other (See Comments)    Delerium/Confusion/Psychosis,    Amlodipine Other (See Comments)    Tired, syncope    Family History  Problem Relation Age of Onset   Heart disease Mother    Hypertension Mother    Rheum arthritis Mother    Osteoarthritis Mother    Heart disease Father    Hypertension Father    Osteoarthritis  Father    Heart disease Sister    Diabetes Brother    Cancer Brother      Prior to Admission medications   Medication Sig Start Date End Date Taking? Authorizing Provider  albuterol (VENTOLIN HFA) 108 (90 Base) MCG/ACT inhaler Inhale 2 puffs into the lungs every 6 (six) hours as needed for wheezing or shortness of breath.    [provider]  allopurinol (ZYLOPRIM) 100 MG tablet TAKE 1 TABLET BY MOUTH EVERY DAY 11/06/20   Rice, Jamesetta Orleans, MD  amoxicillin (AMOXIL) 500 MG capsule Take 2,000 mg by mouth See admin instructions. Dental procedures 03/30/20   [provider]  Budeson-Glycopyrrol-Formoterol (BREZTRI AEROSPHERE) 160-9-4.8 MCG/ACT AERO Inhale 2 puffs into the lungs daily as needed (SOB).    [provider]  Calcium Carbonate-Vitamin D (CALTRATE 600+D PO) Take 1 capsule by mouth daily.    [provider]  cetirizine HCl (ZYRTEC) 5 MG/5ML SOLN Take 5 mg by mouth at bedtime as needed for allergies.    [provider]  Cholecalciferol 25 MCG (1000 UT) tablet Take 1 tablet by mouth daily.    [provider]  feeding supplement (ENSURE IMMUNE HEALTH) LIQD Take 237 mLs by mouth daily as needed (nutrition).    [provider]  ferrous sulfate 325 (65 FE) MG tablet Take 325 mg by mouth daily with breakfast.    [provider]  fluticasone (FLONASE) 50 MCG/ACT nasal spray Place 2 sprays into both nostrils daily as needed for allergies.    [provider]  folic acid (FOLVITE) 800 MCG tablet Take 800 mcg by mouth daily.    [provider]  furosemide (LASIX) 20 MG tablet Take 20 mg by mouth daily. Hold if BP is lower than 110/60    [provider]  hydroxychloroquine (PLAQUENIL) 200 MG tablet Take 1 tablet (200 mg total) by mouth daily. 08/20/20   Fuller Plan, MD  levothyroxine (SYNTHROID) 25 MCG tablet Take 1 tablet (25 mcg total) by mouth daily. 08/22/20   Shamleffer, Konrad Dolores, MD   metoprolol succinate (TOPROL-XL) 25 MG 24 hr tablet Take 1 tablet by mouth daily. Hold dose if <110 SBP or <60 DBP 06/18/20   [provider]  Multiple Vitamins-Minerals (MULTIVITAMIN WITH MINERALS) tablet Take 1 tablet by mouth daily.    [provider]  Nintedanib (OFEV) 150 MG CAPS Take 150 mg by mouth daily.    [provider]  polyvinyl alcohol (LIQUIFILM TEARS) 1.4 % ophthalmic solution Place 1 drop into both eyes as needed for dry eyes.    [provider]  predniSONE (DELTASONE) 5 MG tablet Take 1 tablet (5 mg total) by mouth daily with breakfast. Patient to double up dose during sick day rule 08/20/20   Shamleffer, Konrad Dolores, MD  rivaroxaban (XARELTO) 20 MG TABS tablet Take 1 tablet (20 mg total) by mouth daily with supper. 07/17/20   Lanier Prude, MD  vitamin B-12 (CYANOCOBALAMIN) 1000 MCG tablet Take 1,000 mcg by mouth daily.    [provider]    Physical Exam: Vitals:   12/13/20 2028 12/13/20 2030 12/13/20 2045 12/13/20 2050  BP: 122/88 (!) 111/58 (!) 112/59 118/63  Pulse: 75  70 70  Resp: 16 13 20  (!) 22  Temp: 98.6 F (37 C)     TempSrc:      SpO2: 100%  100% 100%    Constitutional: NAD, calm, ill appearing drowsy female laying in Trendelenburg position.  Awoke easily to light touch. Vitals:   12/13/20 2028 12/13/20 2030 12/13/20 2045 12/13/20 2050  BP: 122/88 (!) 111/58 (!) 112/59 118/63  Pulse: 75  70 70  Resp: 16 13 20  (!) 22  Temp: 98.6 F (37 C)     TempSrc:      SpO2: 100%  100% 100%   Eyes: PERRL, lids and conjunctivae normal ENMT: Mucous membranes are moist.  Neck: normal, supple Respiratory: clear to auscultation bilaterally, no wheezing, no crackles. Normal respiratory effort. No accessory muscle use.  Cardiovascular: Regular rate and rhythm, no murmurs / rubs / gallops. No extremity edema.  Abdomen: Diffuse abdominal tenderness without any guarding, rigidity or rebound tenderness.  Bowel sounds  positive.  Musculoskeletal: no clubbing / cyanosis. No joint deformity upper and lower extremities. Good ROM, no contractures. Normal muscle tone.  Skin: no rashes, lesions, ulcers. No induration Neurologic: CN 2-12 grossly intact. Sensation intact, DTR normal. Strength 5/5 in all 4.  Psychiatric: Normal judgment and insight. Alert and oriented x 3. Normal mood.     Labs on Admission: I have personally reviewed following labs and imaging studies  CBC: Recent Labs  Lab 12/13/20 1846 12/13/20 1928  WBC 5.5  --   NEUTROABS 2.7  --   HGB 6.0* 5.1*  HCT 19.2* 15.0*  MCV 104.9*  --   PLT 91*  --    Basic Metabolic Panel: Recent Labs  Lab 12/13/20 1846 12/13/20 1928  NA 140 145  K 2.8* 2.9*  CL 118* 116*  CO2 17*  --   GLUCOSE 92 87  BUN 15 15  CREATININE 0.75 0.70  CALCIUM 6.2*  --    GFR: CrCl cannot be calculated (Unknown ideal weight.). Liver Function Tests: Recent Labs  Lab 12/13/20 1846  AST 26  ALT 17  ALKPHOS 27*  BILITOT 0.6  PROT 4.4*  ALBUMIN 1.8*   No results for input(s): LIPASE, AMYLASE in the last 168 hours. No results for input(s): AMMONIA in the last 168 hours. Coagulation Profile: No results for input(s): INR, PROTIME in the last 168 hours. Cardiac Enzymes: No results for input(s): CKTOTAL, CKMB, CKMBINDEX, TROPONINI in the last 168 hours. BNP (last 3 results) No results for input(s): PROBNP in the last 8760 hours. HbA1C: No results for input(s): HGBA1C in the last 72 hours. CBG: Recent Labs  Lab 12/13/20 1829  GLUCAP 109*   Lipid Profile: No results for input(s): CHOL, HDL, LDLCALC, TRIG, CHOLHDL, LDLDIRECT in the last 72 hours. Thyroid Function Tests: No results for input(s): TSH, T4TOTAL, FREET4, T3FREE, THYROIDAB in the last 72 hours. Anemia Panel: No results for input(s): VITAMINB12, FOLATE, FERRITIN, TIBC, IRON, RETICCTPCT in the last 72 hours. Urine analysis:    Component Value Date/Time   COLORURINE YELLOW 07/01/2020 0009    APPEARANCEUR CLEAR 07/01/2020 0009   LABSPEC <1.005 (L) 07/01/2020 0009   PHURINE 6.5 07/01/2020 0009   GLUCOSEU NEGATIVE 07/01/2020 0009   HGBUR NEGATIVE 07/01/2020 0009   BILIRUBINUR NEGATIVE 07/01/2020 0009   KETONESUR NEGATIVE 07/01/2020 0009   PROTEINUR NEGATIVE 07/01/2020 0009  NITRITE NEGATIVE 07/01/2020 0009   LEUKOCYTESUR NEGATIVE 07/01/2020 0009    Radiological Exams on Admission: DG Chest Portable 1 View  Result Date: 12/13/2020 CLINICAL DATA:  Shortness of breath EXAM: PORTABLE CHEST 1 VIEW COMPARISON:  05/12/2020 FINDINGS: Cardiac shadow is enlarged. Postsurgical changes and defibrillator are again seen and stable. Mild vascular congestion is again noted. No focal infiltrate or effusion is seen. No bony abnormality is noted. IMPRESSION: Mild vascular congestion stable from the prior exam. Electronically Signed   By: Inez Catalina M.D.   On: 12/13/2020 19:54      Assessment/Plan  Acute symptomatic anemia secondary to GI bleed Hgb of 6 from prior of 10 two weeks ago. CT angio showing acute diverticular hemorrhage. VIR consulted and recommends reversal with Kcentra and to continue resuscitation with blood transfusion. They will be available for mesenteric angiogram and possible embolization if needed overnight  -Transfuse 2 units now and follow q2 H/H with goal of 8  Hypotension Secondary to hypovolemia from acute anemia Continue to be resuscitation with 2 unit PRBC transfusion  Chronic diastolic CHF due to valvular heart disease S/p bioprosthetic AVR/MV ring/bioprosthetic TVR in 2012  Permanent atrial fibrillation with SSS History of VT s/p ICD -Hold Xarelto due to active bleed. given Kcentra for reversal. -Holding beta-blocker due to hypotension  Hypokalemia K of 2.8.  Given 40 mEq orally.  We will also start IV 10 mEq x 3 K supplementation  Hypocalcemia Corrected calcium for albumin of 7.6.  Received IV calcium gluconate in the ED.  Follow with repeat in the  morning.  Also we will replete magnesium  Hypomagnesemia Magnesium 1.7.  Will replete with IV magnesium  ILD -continue Ofev, bronchodilator -follows with Dr. Silas Flood pulmonology   OSA -Off CPAP after weight loss  SLE -Follow by rheumatology Dr. Benjamine Mola - Continue steroid through IV for secondary adrenal insufficiency while NPO  DVT prophylaxis:.SCD Code Status: Full Family Communication: Plan discussed with patient and daughter at bedside  disposition Plan: Home with at least 2 midnight stays  Consults called: VIR Admission status: inpatient  Level of care: Progressive  Status is: Inpatient  Remains inpatient appropriate because: Active          Orene Desanctis DO [facility]   If 7PM-7AM, please contact night-coverage [email or web address]   12/13/2020, 9:30 PM

## 2020-12-13 NOTE — ED Notes (Addendum)
Pt to triage via [facility] for chest pain x 2 days.  Pt pale on arrival to triage with BP in the 60s.  Charge RN notified and pt to treatment room.  IV was started PTA by EMS and NS bolus hung on arrival to triage.

## 2020-12-13 NOTE — ED Provider Notes (Signed)
[facility] EMERGENCY DEPARTMENT Provider Note   CSN: [id number] Arrival date & time: 12/13/20  1821     History No chief complaint on file.   Lindsey Huber is a 75 y.o. female presenting for evaluation of chest pain and lightheadedness.  Patient states she called EMS today due to chest pain.  On arrival to the ER, patient felt lightheaded, like she was going to pass out.  Her chest pain has resolved.  Over the past 2 days, she has had some intermittent shortness of breath, not always with exertion.  She also reports some nausea and diarrhea, states her daughter said her stool was bloody today.  She is on Xarelto due to A. fib, last dose was last night.  She denies fever or urinary symptoms.  Additional history obtained from chart review, patient with a history of A. fib on anticoagulation, CHF, CKD, hypertension, lupus, pulmonary fibrosis.  She had a heart cath done 2 weeks ago.  HPI     Past Medical History:  Diagnosis Date   A-fib ([place])    Anemia    Cardiomyopathy ([place])    CHF (congestive heart failure) ([place])    CKD (chronic kidney disease)    Diverticulosis    Hypertension    Lupus ([place])    Osteoarthritis    Pulmonary fibrosis ([place])    Vitamin D deficiency     Patient Active Problem List   Diagnosis Date Noted   Acute GI bleeding 12/13/2020   Multinodular goiter 08/20/2020   Acquired hypothyroidism 08/20/2020   Idiopathic gout of multiple sites 06/27/2020   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   Vitamin D deficiency 05/01/2020   Mammogram abnormal 05/01/2020   Proteinuria 05/01/2020   Other bilateral secondary osteoarthritis of knee 05/01/2020   Osteoarthritis of hip 05/01/2020   HTN (hypertension) 05/01/2020   GERD without esophagitis 05/01/2020   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[place]) 05/01/2020   Cardiomyopathy ([place]) 05/01/2020   Iatrogenic hypotension 05/01/2020   Hypercoagulability due to atrial fibrillation ([place]) 05/01/2020    Systemic lupus erythematosus ([place]) 05/01/2020   Chronic heart failure with preserved ejection fraction ([place]) 05/01/2020   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   Paroxysmal atrial fibrillation with RVR ([place]) 05/01/2020   Protein calorie malnutrition ([place]) 05/01/2020   Chronic kidney disease (CKD), active medical management without dialysis, stage 3 (moderate) ([place]) 05/01/2020   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   Non-rheumatic atrial fibrillation ([place]) 05/01/2020   Atherosclerosis of abdominal aorta ([place]) 05/01/2020   Diverticulosis of colon 05/01/2020   Cholelithiasis 05/01/2020   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     Family History  Problem Relation Age of Onset   Heart disease Mother    Hypertension Mother    Rheum arthritis Mother    Osteoarthritis Mother    Heart disease Father    Hypertension Father    Osteoarthritis Father    Heart disease  Sister    Diabetes Brother    Cancer Brother     Social History   Tobacco Use   Smoking status: Never   Smokeless tobacco: Never  Vaping Use   Vaping Use: Never used  Substance Use Topics   Alcohol use: Never   Drug use: Never    Home Medications Prior to Admission medications   Medication Sig Start Date End Date Taking? Authorizing Provider  acetaminophen (TYLENOL) 500 MG tablet Take 1,000 mg by mouth every 6 (six) hours as needed (pain).   Yes [provider]  albuterol (VENTOLIN HFA) 108 (90 Base) MCG/ACT inhaler Inhale 2 puffs into the lungs every 6 (six) hours as needed for wheezing or shortness of breath.   Yes [provider]  allopurinol (ZYLOPRIM) 100 MG tablet TAKE 1 TABLET BY MOUTH EVERY DAY Patient taking differently: Take 100 mg by mouth every morning. 11/06/20  Yes Rice, Resa Miner, MD  amoxicillin (AMOXIL) 500 MG capsule Take 2,000 mg by mouth See admin instructions. Take 4 capsules (2000 mg) by mouth one hour prior to dental procedures 03/30/20  Yes [provider]  Budeson-Glycopyrrol-Formoterol (BREZTRI AEROSPHERE) 160-9-4.8 MCG/ACT AERO Inhale 2 puffs into the lungs 2 (two) times daily as needed (shortness of breath).   Yes [provider]  Calcium Carbonate-Vitamin D (CALTRATE 600+D PO) Take 1 capsule by mouth daily with lunch.   Yes [provider]  cetirizine HCl (ZYRTEC) 5 MG/5ML SOLN Take 5 mg by mouth at bedtime.   Yes [provider]  Cholecalciferol 25 MCG (1000 UT) tablet Take 1,000 Units by mouth daily with lunch.   Yes [provider]  ferrous sulfate 325 (65 FE) MG tablet Take 325 mg by mouth daily with lunch.   Yes [provider]  fluticasone (FLONASE) 50 MCG/ACT nasal spray Place 2 sprays into both nostrils daily as needed for allergies.   Yes [provider]  folic acid (FOLVITE) Q000111Q MCG tablet Take 800 mcg by mouth daily with lunch.   Yes [provider]  furosemide (LASIX) 20 MG tablet Take 20 mg by mouth See admin instructions. Take one tablet (20 mg) by mouth every morning, Hold if BP is lower than 110/60   Yes [provider]  hydroxychloroquine (PLAQUENIL) 200 MG tablet Take 1 tablet (200 mg total) by mouth daily. Patient taking differently: Take 200 mg by mouth every morning. 08/20/20  Yes Rice, Resa Miner, MD  levothyroxine (SYNTHROID) 25 MCG tablet Take 1 tablet (25 mcg total) by mouth daily. Patient taking  differently: Take 25 mcg by mouth daily before breakfast. 08/22/20  Yes Shamleffer, Melanie Crazier, MD  metoprolol succinate (TOPROL-XL) 25 MG 24 hr tablet Take 25 mg by mouth See admin instructions. Take one tablet (25 mg) by mouth every morning, Hold dose if <110 SBP or <60 DBP 06/18/20  Yes [provider]  Multiple Vitamins-Minerals (MULTIVITAMIN WITH MINERALS) tablet Take 1 tablet by mouth daily with lunch.   Yes [provider]  Nintedanib (OFEV) 150 MG CAPS Take 150 mg by mouth at bedtime.   Yes [provider]  polyvinyl alcohol (LIQUIFILM TEARS) 1.4 % ophthalmic solution Place 1 drop into both eyes daily as needed for dry eyes.   Yes [provider]  predniSONE (DELTASONE) 5 MG tablet Take 1 tablet (5 mg total) by mouth daily with breakfast. Patient to double up dose during sick day rule Patient taking differently: Take 5 mg by mouth See admin instructions. Take one  tablet (5 mg) by mouth every morning, double up dose during sick day rule 08/20/20  Yes Shamleffer, Melanie Crazier, MD  rivaroxaban (XARELTO) 20 MG TABS tablet Take 1 tablet (20 mg total) by mouth daily with supper. 07/17/20  Yes Vickie Epley, MD  vitamin B-12 (CYANOCOBALAMIN) 1000 MCG tablet Take 1,000 mcg by mouth daily with lunch.   Yes [provider]    Allergies    Propofol, Flecainide, Amiodarone, and Amlodipine  Review of Systems   Review of Systems  Cardiovascular:  Positive for chest pain (resolved).  Gastrointestinal:  Positive for blood in stool.  Hematological:  Bruises/bleeds easily.  All other systems reviewed and are negative.  Physical Exam Updated Vital Signs BP 118/63   Pulse 70   Temp 98.6 F (37 C)   Resp (!) 22   Wt 53.5 kg   SpO2 100%   BMI 21.58 kg/m   Physical Exam Vitals and nursing note reviewed.  Constitutional:      General: She is in acute distress.     Appearance: Normal appearance. She is ill-appearing.  HENT:     Head:  Normocephalic and atraumatic.  Eyes:     Conjunctiva/sclera: Conjunctivae normal.     Pupils: Pupils are equal, round, and reactive to light.  Cardiovascular:     Rate and Rhythm: Normal rate and regular rhythm.     Pulses: Normal pulses.  Pulmonary:     Effort: Pulmonary effort is normal. No respiratory distress.     Breath sounds: Normal breath sounds. No wheezing.     Comments: Speaking in full sentences.  Clear lung sounds in all fields. Abdominal:     General: There is no distension.     Palpations: Abdomen is soft. There is no mass.     Tenderness: There is abdominal tenderness. There is no guarding or rebound.     Comments: Mild ttp of lower abd  Musculoskeletal:        General: Normal range of motion.     Cervical back: Normal range of motion and neck supple.  Skin:    General: Skin is warm and dry.     Capillary Refill: Capillary refill takes less than 2 seconds.     Coloration: Skin is pale.  Neurological:     Mental Status: She is alert and oriented to person, place, and time.  Psychiatric:        Mood and Affect: Mood and affect normal.        Speech: Speech normal.        Behavior: Behavior normal.    ED Results / Procedures / Treatments   Labs (all labs ordered are listed, but only abnormal results are displayed) Labs Reviewed  BRAIN NATRIURETIC PEPTIDE - Abnormal; Notable for the following components:      Result Value   B Natriuretic Peptide 117.6 (*)    All other components within normal limits  CBC WITH DIFFERENTIAL/PLATELET - Abnormal; Notable for the following components:   RBC 1.83 (*)    Hemoglobin 6.0 (*)    HCT 19.2 (*)    MCV 104.9 (*)    Platelets 91 (*)    All other components within normal limits  COMPREHENSIVE METABOLIC PANEL - Abnormal; Notable for the following components:   Potassium 2.8 (*)    Chloride 118 (*)    CO2 17 (*)    Calcium 6.2 (*)    Total Protein 4.4 (*)    Albumin 1.8 (*)  Alkaline Phosphatase 27 (*)    All other  components within normal limits  MAGNESIUM - Abnormal; Notable for the following components:   Magnesium 1.6 (*)    All other components within normal limits  CBG MONITORING, ED - Abnormal; Notable for the following components:   Glucose-Capillary 109 (*)    All other components within normal limits  I-STAT CHEM 8, ED - Abnormal; Notable for the following components:   Potassium 2.9 (*)    Chloride 116 (*)    Calcium, Ion 0.90 (*)    TCO2 19 (*)    Hemoglobin 5.1 (*)    HCT 15.0 (*)    All other components within normal limits  POC OCCULT BLOOD, ED - Abnormal; Notable for the following components:   Fecal Occult Bld POSITIVE (*)    All other components within normal limits  TROPONIN I (HIGH SENSITIVITY) - Abnormal; Notable for the following components:   Troponin I (High Sensitivity) 70 (*)    All other components within normal limits  RESP PANEL BY RT-PCR (FLU A&B, COVID) ARPGX2  LACTIC ACID, PLASMA  LACTIC ACID, PLASMA  TYPE AND SCREEN  PREPARE RBC (CROSSMATCH)  ABO/RH  PREPARE RBC (CROSSMATCH)    EKG EKG Interpretation  Date/Time:  Saturday December 13 2020 18:35:37 EST Ventricular Rate:  80 PR Interval:  474 QRS Duration: 135 QT Interval:  467 QTC Calculation: 539 R Axis:   264 Text Interpretation: Ventricular-paced rhythm No further analysis attempted due to paced rhythm No significant change since last tracing Confirmed by Linwood Dibbles [phone]) on 12/13/2020 6:37:09 PM  Radiology DG Chest Portable 1 View  Result Date: 12/13/2020 CLINICAL DATA:  Shortness of breath EXAM: PORTABLE CHEST 1 VIEW COMPARISON:  05/12/2020 FINDINGS: Cardiac shadow is enlarged. Postsurgical changes and defibrillator are again seen and stable. Mild vascular congestion is again noted. No focal infiltrate or effusion is seen. No bony abnormality is noted. IMPRESSION: Mild vascular congestion stable from the prior exam. Electronically Signed   By: Alcide Clever M.D.   On: 12/13/2020 19:54   CT  ANGIO GI BLEED  Result Date: 12/13/2020 CLINICAL DATA:  Bright red blood per rectum, hypertension EXAM: CTA ABDOMEN AND PELVIS WITHOUT AND WITH CONTRAST TECHNIQUE: Multidetector CT imaging of the abdomen and pelvis was performed using the standard protocol during bolus administration of intravenous contrast. Multiplanar reconstructed images and MIPs were obtained and reviewed to evaluate the vascular anatomy. CONTRAST:  OMNIPAQUE IOHEXOL 350 MG/ML SOLN COMPARISON:  06/30/2020 FINDINGS: VASCULAR Aorta: Minimal atherosclerotic calcification. Normal caliber; no aneurysm or dissection. No periaortic inflammatory change. Celiac: Patent without evidence of aneurysm, dissection, vasculitis or significant stenosis. SMA: Patent without evidence of aneurysm, dissection, vasculitis or significant stenosis. Renals: Single right and dual left renal arteries are widely patent demonstrate normal vascular morphology. No aneurysm or dissection. IMA: Patent without evidence of aneurysm, dissection, vasculitis or significant stenosis. Inflow: Patent without evidence of aneurysm, dissection, vasculitis or significant stenosis. Proximal Outflow: Bilateral common femoral and visualized portions of the superficial and profunda femoral arteries are patent without evidence of aneurysm, dissection, vasculitis or significant stenosis. Veins: Unremarkable Review of the MIP images confirms the above findings. NON-VASCULAR Lower chest: Ground-glass pulmonary infiltrate with associated intra and interlobular septal thickening and traction bronchiectasis in keeping with pulmonary fibrosis again noted at the visualized lung bases, similar to prior examination. Tricuspid, mitral, and possible aortic valve replacement has been performed. Pacemaker leads are seen within the right atrium and right ventricle. Moderate cardiomegaly with biatrial  enlargement. Reflux of contrast into the hepatic venous system is in keeping with at least some  degree of right heart failure or tricuspid regurgitation. Hepatobiliary: Cholelithiasis without pericholecystic inflammatory change. Gallbladder unremarkable. No intra or extrahepatic biliary ductal dilation. Pancreas: Unremarkable Spleen: Unremarkable Adrenals/Urinary Tract: Adrenal glands are unremarkable. Kidneys are normal, without renal calculi, focal lesion, or hydronephrosis. Bladder is unremarkable. Stomach/Bowel: There is active gastrointestinal hemorrhage involving the mid descending colon, best seen on axial image # 62/6 and coronal image # 96/11. The underlying etiology of the hemorrhage is not clearly identified, however, this most likely represents a small diverticular hemorrhage. There is background moderate sigmoid diverticulosis. The stomach, small bowel, and large bowel are otherwise unremarkable. The appendix is not clearly identified and is likely absent. Lymphatic: No pathologic adenopathy within the abdomen and pelvis. Reproductive: Uterus and bilateral adnexa are unremarkable. Other: No abdominal wall hernia.  The rectum is unremarkable. Musculoskeletal: There is grade 2 anterolisthesis of L4-5 with superimposed advanced degenerative changes. Resultant marked central canal stenosis best seen on axial image # 70/6. Superior endplate fracture of L3 with approximately 10-20% loss of height noted. Osseous structures are diffusely osteopenic. No acute bone abnormality identified. No lytic or blastic bone lesions. IMPRESSION: VASCULAR No acute findings. NON-VASCULAR Active gastrointestinal hemorrhage involving the mid descending colon likely representing an active diverticular hemorrhage. Stable bibasilar pulmonary fibrotic change. Moderate cardiomegaly with biatrial enlargement and at least some degree of right heart failure or tricuspid regurgitation. Cholelithiasis. Moderate sigmoid diverticulosis. Grade 2 anterolisthesis L4-5 with superimposed advanced degenerative change. Resultant marked  central canal stenosis. These results were called by telephone at the time of interpretation on 12/13/2020 at 9:52 pm to provider [facility] , who verbally acknowledged these results. Electronically Signed   By: Fidela Salisbury M.D.   On: 12/13/2020 21:54    Procedures Ultrasound ED Peripheral IV (Provider)  Date/Time: 12/13/2020 8:06 PM Performed by: Franchot Heidelberg, PA-C Authorized by: Franchot Heidelberg, PA-C   Procedure details:    Indications: hypotension and poor IV access     Skin Prep: isopropyl alcohol     Location: R proximal arm.   Angiocath:  20 G   Bedside Ultrasound Guided: Yes     Images: not archived     Patient tolerated procedure without complications: Yes     Dressing applied: Yes   .Critical Care Performed by: Franchot Heidelberg, PA-C Authorized by: Franchot Heidelberg, PA-C   Critical care provider statement:    Critical care time (minutes):  50   Critical care time was exclusive of:  Separately billable procedures and treating other patients and teaching time   Critical care was necessary to treat or prevent imminent or life-threatening deterioration of the following conditions:  Metabolic crisis and circulatory failure   Critical care was time spent personally by me on the following activities:  Blood draw for specimens, development of treatment plan with patient or surrogate, examination of patient, evaluation of patient's response to treatment, obtaining history from patient or surrogate, ordering and performing treatments and interventions, ordering and review of laboratory studies, ordering and review of radiographic studies, pulse oximetry, review of old charts and re-evaluation of patient's condition   I assumed direction of critical care for this patient from another provider in my specialty: no     Care discussed with: admitting provider     Medications Ordered in ED Medications  calcium gluconate 1 g/ 50 mL sodium chloride IVPB (1,000 mg Intravenous  New Bag/Given 12/13/20 2212)  potassium chloride  SA (KLOR-CON) CR tablet 40 mEq (has no administration in time range)  0.9 %  sodium chloride infusion (Manually program via Guardrails IV Fluids) (has no administration in time range)  prothrombin complex conc human (KCENTRA) IVPB 2,669 Units (has no administration in time range)  0.9 %  sodium chloride infusion (Manually program via Guardrails IV Fluids) ( Intravenous New Bag/Given 12/13/20 1945)  pantoprazole (PROTONIX) injection 40 mg (40 mg Intravenous Given 12/13/20 1944)  iohexol (OMNIPAQUE) 350 MG/ML injection 100 mL (100 mLs Intravenous Contrast Given 12/13/20 2117)    ED Course  I have reviewed the triage vital signs and the nursing notes.  Pertinent labs & imaging results that were available during my care of the patient were reviewed by me and considered in my medical decision making (see chart for details).    MDM Rules/Calculators/A&P                           Patient presenting for evaluation of chest pain and lightheadedness/hypotension.  On exam, chest pain has resolved, however patient appears ill.  She is hypotensive and pale.  She has grossly positive rectal exam.  Concern for GI bleed, labs and blood ordered.  Protonix for bleed.  BP responding well to fluids and pt in trendelenburg.   Hemoglobin low at 5.  On reevaluation, blood pressure improved, overall 100.  As patient's last dose of anticoagulant was last night, and blood pressure is improving, patient remains ANO, will hold on reversal agent.  Labs interpreted by me, shows hypocalcemia, hypokalemia.  Replenishment ordered.  Will test magnesium.  Troponin minimally elevated at 7, this is likely due to demand.  Without an ischemic EKG or chest pain, doubt ACS.  Chest x-ray viewed and independently interpreted by me, shows cardiomegaly, per radiology stable exam.  Patient will need to be admitted for anemia 2/2 GI bleed and electrolyte abnormality.on reevaluation  blood pressure is stable.  Patient is now reporting some mild left-sided abdominal pain.  As such, will obtain CTA of the abdomen to rule out bleed.  CTA shows an acute bleed in the descending colon, likely diverticular.  Discussed with Dr. Deno Etienne from GI who recommends IR consultation and management.  Discussed with Dr. Earleen Newport from interventional radiology who will evaluate the patient.  Recommends reversal agent.  Discussed with pharmacy, will order [place].  Discussed with Dr. Flossie Buffy from [facility] hospitalist service, patient to be admitted.   Final Clinical Impression(s) / ED Diagnoses Final diagnoses:  Gastrointestinal hemorrhage associated with intestinal diverticulitis  Hypocalcemia  Hypokalemia  Hypomagnesemia    Rx / DC Orders ED Discharge Orders     None        Franchot Heidelberg, PA-C 12/13/20 2240    Dorie Rank, MD 12/14/20 1314

## 2020-12-13 NOTE — ED Notes (Signed)
When pt came to room she was placed in trendelenburg & NS fluid bolus with pressure bag was used, A/Ox4, verbal & very pale in appearance.

## 2020-12-13 NOTE — Consult Note (Signed)
Chief Complaint: GI hemorrhage  Referring Physician(s): Dr. Lisbeth Renshaw  Supervising Physician: Gilmer Mor  Patient Status: [facility] - ED  History of Present Illness: Lindsey Huber is a 75 y.o. female presenting to [facility] ED with her daughter, with bloody bowel movement.   Her daughter gives most of the history.  They live together.   Earlier today her daughter tells me that she noticed some blood in the stool, which occurred twice at home.  She has not moved her bowels at the hospital.  This is the first time this has happened according to her daughter.    She is therapeutic on xarelto, QD dosing, secondary to atrial fibrillation and aortic valve disease.    [name] denies any chest pain or abdominal pain at this time.  She is rousable, though somnolent.   Pertinent labs: BNP: 117.6 Trop I: 70 H&H: 6.0/19.2 Platelets: 91 WBC: 5.5 Cr: 0.75 BUN: 15 Ca: 6.2 K: 2.8 Mg: 1.6 Lactic Acid: 1.2  Vitals: SBP 109-120's HR: 70 O2: 100  CTA GI bleeding study is positive for contrast in the descending colon.  Diverticular disease  Past Medical History:  Diagnosis Date   A-fib (HCC)    Anemia    Cardiomyopathy (HCC)    CHF (congestive heart failure) (HCC)    CKD (chronic kidney disease)    Diverticulosis    Hypertension    Lupus (HCC)    Osteoarthritis    Pulmonary fibrosis (HCC)    Vitamin D deficiency     Past Surgical History:  Procedure Laterality Date   CARDIAC VALVE SURGERY     CARPAL TUNNEL RELEASE     HEMORRHOID SURGERY     PACEMAKER INSERTION     RIGHT/LEFT HEART CATH AND CORONARY ANGIOGRAPHY N/A 11/27/2020   Procedure: RIGHT/LEFT HEART CATH AND CORONARY ANGIOGRAPHY;  Surgeon: Dolores Patty, MD;  Location: [facility] INVASIVE CV LAB;  Service: Cardiovascular;  Laterality: N/A;   TONSILLECTOMY      Allergies: Propofol, Flecainide, Amiodarone, and Amlodipine  Medications: Prior to Admission medications   Medication Sig Start Date End Date Taking?  Authorizing Provider  acetaminophen (TYLENOL) 500 MG tablet Take 1,000 mg by mouth every 6 (six) hours as needed (pain).   Yes [provider]  albuterol (VENTOLIN HFA) 108 (90 Base) MCG/ACT inhaler Inhale 2 puffs into the lungs every 6 (six) hours as needed for wheezing or shortness of breath.   Yes [provider]  allopurinol (ZYLOPRIM) 100 MG tablet TAKE 1 TABLET BY MOUTH EVERY DAY Patient taking differently: Take 100 mg by mouth every morning. 11/06/20  Yes Rice, Lindsey Orleans, MD  amoxicillin (AMOXIL) 500 MG capsule Take 2,000 mg by mouth See admin instructions. Take 4 capsules (2000 mg) by mouth one hour prior to dental procedures 03/30/20  Yes [provider]  Budeson-Glycopyrrol-Formoterol (BREZTRI AEROSPHERE) 160-9-4.8 MCG/ACT AERO Inhale 2 puffs into the lungs 2 (two) times daily as needed (shortness of breath).   Yes [provider]  Calcium Carbonate-Vitamin D (CALTRATE 600+D PO) Take 1 capsule by mouth daily with lunch.   Yes [provider]  cetirizine HCl (ZYRTEC) 5 MG/5ML SOLN Take 5 mg by mouth at bedtime.   Yes [provider]  Cholecalciferol 25 MCG (1000 UT) tablet Take 1,000 Units by mouth daily with lunch.   Yes [provider]  ferrous sulfate 325 (65 FE) MG tablet Take 325 mg by mouth daily with lunch.   Yes [provider]  fluticasone (FLONASE) 50 MCG/ACT nasal  spray Place 2 sprays into both nostrils daily as needed for allergies.   Yes [provider]  folic acid (FOLVITE) Q000111Q MCG tablet Take 800 mcg by mouth daily with lunch.   Yes [provider]  furosemide (LASIX) 20 MG tablet Take 20 mg by mouth See admin instructions. Take one tablet (20 mg) by mouth every morning, Hold if BP is lower than 110/60   Yes [provider]  hydroxychloroquine (PLAQUENIL) 200 MG tablet Take 1 tablet (200 mg total) by mouth daily. Patient taking differently: Take 200 mg by mouth every morning.  08/20/20  Yes Rice, Lindsey Miner, MD  levothyroxine (SYNTHROID) 25 MCG tablet Take 1 tablet (25 mcg total) by mouth daily. Patient taking differently: Take 25 mcg by mouth daily before breakfast. 08/22/20  Yes Shamleffer, Melanie Crazier, MD  metoprolol succinate (TOPROL-XL) 25 MG 24 hr tablet Take 25 mg by mouth See admin instructions. Take one tablet (25 mg) by mouth every morning, Hold dose if <110 SBP or <60 DBP 06/18/20  Yes [provider]  Multiple Vitamins-Minerals (MULTIVITAMIN WITH MINERALS) tablet Take 1 tablet by mouth daily with lunch.   Yes [provider]  Nintedanib (OFEV) 150 MG CAPS Take 150 mg by mouth at bedtime.   Yes [provider]  polyvinyl alcohol (LIQUIFILM TEARS) 1.4 % ophthalmic solution Place 1 drop into both eyes daily as needed for dry eyes.   Yes [provider]  predniSONE (DELTASONE) 5 MG tablet Take 1 tablet (5 mg total) by mouth daily with breakfast. Patient to double up dose during sick day rule Patient taking differently: Take 5 mg by mouth See admin instructions. Take one tablet (5 mg) by mouth every morning, double up dose during sick day rule 08/20/20  Yes Shamleffer, Melanie Crazier, MD  rivaroxaban (XARELTO) 20 MG TABS tablet Take 1 tablet (20 mg total) by mouth daily with supper. 07/17/20  Yes Vickie Epley, MD  vitamin B-12 (CYANOCOBALAMIN) 1000 MCG tablet Take 1,000 mcg by mouth daily with lunch.   Yes [provider]     Family History  Problem Relation Age of Onset   Heart disease Mother    Hypertension Mother    Rheum arthritis Mother    Osteoarthritis Mother    Heart disease Father    Hypertension Father    Osteoarthritis Father    Heart disease Sister    Diabetes Brother    Cancer Brother     Social History   Socioeconomic History   Marital status: Legally Separated    Spouse name: Not on file   Number of children: Not on file   Years of education: Not on file   Highest education level:  Not on file  Occupational History   Not on file  Tobacco Use   Smoking status: Never   Smokeless tobacco: Never  Vaping Use   Vaping Use: Never used  Substance and Sexual Activity   Alcohol use: Never   Drug use: Never   Sexual activity: Not on file  Other Topics Concern   Not on file  Social History Narrative   Not on file   Social Determinants of Health   Financial Resource Strain: Not on file  Food Insecurity: Not on file  Transportation Needs: Not on file  Physical Activity: Not on file  Stress: Not on file  Social Connections: Not on file       Review of Systems: A 12 point ROS discussed and pertinent positives are indicated  in the HPI above.  All other systems are negative.  Review of Systems  Vital Signs: BP 109/61   Pulse 70   Temp 98.2 F (36.8 C)   Resp 16   Wt 53.5 kg   SpO2 100%   BMI 21.58 kg/m   Physical Exam General: 75 yo female appearing stated age.  Frail, somnolent, arousable HEENT: Atraumatic, normocephalic.  Conjugate gaze, extra-ocular motor intact. No scleral icterus or scleral injection. No lesions on external ears, nose, lips, or gums.  Oral mucosa moist, pink.  Neck: Symmetric with no goiter enlargement.  Chest/Lungs:  Symmetric chest with inspiration/expiration.  No labored breathing.  Clear to auscultation with no wheezes, rhonchi, or rales.  Heart:  RRR, with mechanical heart sounds appreciated. No JVD appreciated.  Abdomen:  Soft, NT/ND, with + bowel sounds.  Heme positive rectal exam, with no active bleeding identified.  Genito-urinary: Deferred Neurologic:  Appropriate questions.  Moving all 4 extremities with gross sensory intact.  Pulse Exam:  No bruit appreciated.   Palpable right CFA & left CFA.   Extremities: cool extremities.  No pitting edema  Imaging: CARDIAC CATHETERIZATION  Result Date: 11/27/2020   The left ventricular ejection fraction is 55-65% by visual estimate. Findings: Ao =129/63 (89) LV = 141/8 RA = 11 (v  waves to 14) RV = 43/4 PA = 42/15 (25) PCW = 10 Fick cardiac output/index = 5.7/3.7 PVR = 2.1 WU SVR = 1101 PAPi = 2.45 WU Ao sat = 96% PA sat =  74%, 75% SVC sat = 82% MVA (LV-wedge tracing) = 3.5cm2 mean gradient across MV 3.91mmHG Assessment: 1. Normal coronary arteries 2. EF 60-65% 3. Very mild PAH 4. Normal left-sided filling pressures with no significant v-waves in PCWP tracing to suggest hemodynamically significant MR 5. No significant stenosis across AoV, MV or TV Plan/Discussion: Medical therapy Arvilla Meres, MD 1:17 PM  DG Chest Portable 1 View  Result Date: 12/13/2020 CLINICAL DATA:  Shortness of breath EXAM: PORTABLE CHEST 1 VIEW COMPARISON:  05/12/2020 FINDINGS: Cardiac shadow is enlarged. Postsurgical changes and defibrillator are again seen and stable. Mild vascular congestion is again noted. No focal infiltrate or effusion is seen. No bony abnormality is noted. IMPRESSION: Mild vascular congestion stable from the prior exam. Electronically Signed   By: Alcide Clever M.D.   On: 12/13/2020 19:54   CUP PACEART REMOTE DEVICE CHECK  Result Date: 11/18/2020 Scheduled remote reviewed. Normal device function.  Next remote 91 days- JBox, RN/CVRS  CT ANGIO GI BLEED  Result Date: 12/13/2020 CLINICAL DATA:  Bright red blood per rectum, hypertension EXAM: CTA ABDOMEN AND PELVIS WITHOUT AND WITH CONTRAST TECHNIQUE: Multidetector CT imaging of the abdomen and pelvis was performed using the standard protocol during bolus administration of intravenous contrast. Multiplanar reconstructed images and MIPs were obtained and reviewed to evaluate the vascular anatomy. CONTRAST:  OMNIPAQUE IOHEXOL 350 MG/ML SOLN COMPARISON:  06/30/2020 FINDINGS: VASCULAR Aorta: Minimal atherosclerotic calcification. Normal caliber; no aneurysm or dissection. No periaortic inflammatory change. Celiac: Patent without evidence of aneurysm, dissection, vasculitis or significant stenosis. SMA: Patent without evidence of  aneurysm, dissection, vasculitis or significant stenosis. Renals: Single right and dual left renal arteries are widely patent demonstrate normal vascular morphology. No aneurysm or dissection. IMA: Patent without evidence of aneurysm, dissection, vasculitis or significant stenosis. Inflow: Patent without evidence of aneurysm, dissection, vasculitis or significant stenosis. Proximal Outflow: Bilateral common femoral and visualized portions of the superficial and profunda femoral arteries are patent without evidence of aneurysm, dissection,  vasculitis or significant stenosis. Veins: Unremarkable Review of the MIP images confirms the above findings. NON-VASCULAR Lower chest: Ground-glass pulmonary infiltrate with associated intra and interlobular septal thickening and traction bronchiectasis in keeping with pulmonary fibrosis again noted at the visualized lung bases, similar to prior examination. Tricuspid, mitral, and possible aortic valve replacement has been performed. Pacemaker leads are seen within the right atrium and right ventricle. Moderate cardiomegaly with biatrial enlargement. Reflux of contrast into the hepatic venous system is in keeping with at least some degree of right heart failure or tricuspid regurgitation. Hepatobiliary: Cholelithiasis without pericholecystic inflammatory change. Gallbladder unremarkable. No intra or extrahepatic biliary ductal dilation. Pancreas: Unremarkable Spleen: Unremarkable Adrenals/Urinary Tract: Adrenal glands are unremarkable. Kidneys are normal, without renal calculi, focal lesion, or hydronephrosis. Bladder is unremarkable. Stomach/Bowel: There is active gastrointestinal hemorrhage involving the mid descending colon, best seen on axial image # 62/6 and coronal image # 96/11. The underlying etiology of the hemorrhage is not clearly identified, however, this most likely represents a small diverticular hemorrhage. There is background moderate sigmoid diverticulosis. The  stomach, small bowel, and large bowel are otherwise unremarkable. The appendix is not clearly identified and is likely absent. Lymphatic: No pathologic adenopathy within the abdomen and pelvis. Reproductive: Uterus and bilateral adnexa are unremarkable. Other: No abdominal wall hernia.  The rectum is unremarkable. Musculoskeletal: There is grade 2 anterolisthesis of L4-5 with superimposed advanced degenerative changes. Resultant marked central canal stenosis best seen on axial image # 70/6. Superior endplate fracture of L3 with approximately 10-20% loss of height noted. Osseous structures are diffusely osteopenic. No acute bone abnormality identified. No lytic or blastic bone lesions. IMPRESSION: VASCULAR No acute findings. NON-VASCULAR Active gastrointestinal hemorrhage involving the mid descending colon likely representing an active diverticular hemorrhage. Stable bibasilar pulmonary fibrotic change. Moderate cardiomegaly with biatrial enlargement and at least some degree of right heart failure or tricuspid regurgitation. Cholelithiasis. Moderate sigmoid diverticulosis. Grade 2 anterolisthesis L4-5 with superimposed advanced degenerative change. Resultant marked central canal stenosis. These results were called by telephone at the time of interpretation on 12/13/2020 at 9:52 pm to provider [facility] , who verbally acknowledged these results. Electronically Signed   By: Fidela Salisbury M.D.   On: 12/13/2020 21:54    Labs:  CBC: Recent Labs    07/10/20 1930 10/27/20 1556 11/27/20 1020 11/27/20 1226 11/27/20 1231 11/27/20 1258 12/13/20 1846 12/13/20 1928  WBC 4.0 6.5 6.8  --   --   --  5.5  --   HGB 8.3* 10.0* 12.6   < > 11.6*  10.2* 10.5* 6.0* 5.1*  HCT 25.8* 30.4* 38.0   < > 34.0*  30.0* 31.0* 19.2* 15.0*  PLT 92* 135* 142*  --   --   --  91*  --    < > = values in this interval not displayed.    COAGS: Recent Labs    07/01/20 2006  INR 1.5*  APTT 36    BMP: Recent Labs     07/10/20 1930 08/20/20 0919 10/27/20 1556 11/27/20 1020 11/27/20 1226 11/27/20 1231 11/27/20 1258 12/13/20 1846 12/13/20 1928  NA 139 138 139 138   < > 140  143 134* 140 145  K 3.5 3.6 3.8 4.0   < > 3.3*  2.8* 2.7* 2.8* 2.9*  CL 112* 105 105 100  --   --   --  118* 116*  CO2 21* 24 26 30   --   --   --  17*  --  GLUCOSE 66* 69* 96 80  --   --   --  92 87  BUN 11 20 22 20   --   --   --  15 15  CALCIUM 7.6* 9.4 9.2 9.2  --   --   --  6.2*  --   CREATININE 0.67 0.98 1.00 1.05*  --   --   --  0.75 0.70  GFRNONAA >60  --  59* 56*  --   --   --  >60  --    < > = values in this interval not displayed.    LIVER FUNCTION TESTS: Recent Labs    07/01/20 2006 12/13/20 1846  BILITOT 0.7 0.6  AST 56* 26  ALT 41 17  ALKPHOS 69 27*  PROT 7.4 4.4*  ALBUMIN 3.2* 1.8*    TUMOR MARKERS: No results for input(s): AFPTM, CEA, CA199, CHROMGRNA in the last 8760 hours.  Assessment and Plan:  Ms Berringer is 75 yo female with lower GI bleeding localized to the descending colon on CTA performed in the ED.   She is currently therapeutic on xarelto, with last dose confirmed last night.   She is undergoing resuscitation, starting second unit of PRBC's now.   Discussed with Dr. Junious Silk the need for considering reversal of the [facility] as the first step for controlling a presumed symptomatic diverticular bleed.  This would be for primary control of the bleed, but also to make any need for arterial access/mesenteric angiogram safer.    I had a discussion with Ms Leavy daughter regarding the role of mesenteric angiogram and possible embolization in this setting, and the historical role of surgery.  I do agree that mesenteric angiogram should be the first step.  I offered a complete informed consent regarding our procedure, and consent was obtained.   Risks and benefits of mesenteric angiogram possible embolization were discussed with the patient and her daughter including, but not limited to bleeding,  infection, contrast reaction, kidney/organ injury, vascular injury, non target embolization, cardiopulmonary collapse, death.   This interventional procedure involves the use of X-rays and because of the nature of the planned procedure, it is possible that we will have prolonged use of X-ray fluoroscopy.  Consent signed and in chart.  Plan:  Our current plan is to continue resuscitation and reversal of the [facility], and we will follow along.  Should there be any change in status we are prepared for mesenteric angiogram and possible embolization.    Thank you for this interesting consult.  I greatly enjoyed meeting Verlinda Kinnison and look forward to participating in their care.  A copy of this report was sent to the requesting provider on this date.  Electronically Signed: Corrie Mckusick, DO 12/13/2020, 11:12 PM   I spent a total of 67 Miinutes    in face to face in clinical consultation, greater than 50% of which was counseling/coordinating care for lower GI hemorrhage, possible mesenteric angiogram possible embolization

## 2020-12-14 ENCOUNTER — Other Ambulatory Visit: Payer: Self-pay

## 2020-12-14 ENCOUNTER — Encounter (HOSPITAL_COMMUNITY): Payer: Self-pay | Admitting: Family Medicine

## 2020-12-14 DIAGNOSIS — E876 Hypokalemia: Secondary | ICD-10-CM

## 2020-12-14 DIAGNOSIS — I959 Hypotension, unspecified: Secondary | ICD-10-CM

## 2020-12-14 DIAGNOSIS — K922 Gastrointestinal hemorrhage, unspecified: Secondary | ICD-10-CM | POA: Diagnosis not present

## 2020-12-14 DIAGNOSIS — I5032 Chronic diastolic (congestive) heart failure: Secondary | ICD-10-CM | POA: Diagnosis not present

## 2020-12-14 HISTORY — DX: Hypocalcemia: E83.51

## 2020-12-14 HISTORY — DX: Hypomagnesemia: E83.42

## 2020-12-14 HISTORY — DX: Hypotension, unspecified: I95.9

## 2020-12-14 HISTORY — DX: Hypokalemia: E87.6

## 2020-12-14 LAB — HEMOGLOBIN AND HEMATOCRIT, BLOOD
HCT: 27.2 % — ABNORMAL LOW (ref 36.0–46.0)
HCT: 26.3 % — ABNORMAL LOW (ref 36.0–46.0)
HCT: 26.3 % — ABNORMAL LOW (ref 36.0–46.0)
HCT: 29.6 % — ABNORMAL LOW (ref 36.0–46.0)
HCT: 27.4 % — ABNORMAL LOW (ref 36.0–46.0)
HCT: 28.7 % — ABNORMAL LOW (ref 36.0–46.0)
Hemoglobin: 9.1 g/dL — ABNORMAL LOW (ref 12.0–15.0)
Hemoglobin: 8.7 g/dL — ABNORMAL LOW (ref 12.0–15.0)
Hemoglobin: 8.9 g/dL — ABNORMAL LOW (ref 12.0–15.0)
Hemoglobin: 9.5 g/dL — ABNORMAL LOW (ref 12.0–15.0)
Hemoglobin: 9.1 g/dL — ABNORMAL LOW (ref 12.0–15.0)
Hemoglobin: 9.7 g/dL — ABNORMAL LOW (ref 12.0–15.0)

## 2020-12-14 LAB — BPAM RBC
Blood Product Expiration Date: 202211252359
Blood Product Expiration Date: 202212192359
ISSUE DATE / TIME: 202211191943
ISSUE DATE / TIME: 202211192229
Unit Type and Rh: 5100
Unit Type and Rh: 600

## 2020-12-14 LAB — BASIC METABOLIC PANEL
Anion gap: 7 (ref 5–15)
BUN: 19 mg/dL (ref 8–23)
CO2: 19 mmol/L — ABNORMAL LOW (ref 22–32)
Calcium: 8 mg/dL — ABNORMAL LOW (ref 8.9–10.3)
Chloride: 108 mmol/L (ref 98–111)
Creatinine, Ser: 0.99 mg/dL (ref 0.44–1.00)
GFR, Estimated: >60 mL/min (ref >60)
Glucose, Bld: 198 mg/dL — ABNORMAL HIGH (ref 70–99)
Potassium: 4.9 mmol/L (ref 3.5–5.1)
Sodium: 134 mmol/L — ABNORMAL LOW (ref 135–145)

## 2020-12-14 LAB — RESP PANEL BY RT-PCR (FLU A&B, COVID) ARPGX2
Influenza A by PCR: NEGATIVE
Influenza B by PCR: NEGATIVE
SARS Coronavirus 2 by RT PCR: NEGATIVE

## 2020-12-14 LAB — TYPE AND SCREEN
ABO/RH(D): A NEG
Antibody Screen: NEGATIVE
Unit division: 0
Unit division: 0

## 2020-12-14 MED ORDER — PREDNISONE 5 MG PO TABS
5.0000 mg | ORAL_TABLET | Freq: Every day | ORAL | Status: DC
  Administered 2020-12-15 – 2020-12-23 (×9): 5 mg via ORAL
  Filled 2020-12-14 (×8): qty 1

## 2020-12-14 MED ORDER — VITAMIN B-12 1000 MCG PO TABS
1000.0000 ug | ORAL_TABLET | Freq: Every day | ORAL | Status: DC
  Administered 2020-12-15 – 2020-12-22 (×7): 1000 ug via ORAL
  Filled 2020-12-14 (×7): qty 1

## 2020-12-14 MED ORDER — POTASSIUM CHLORIDE 10 MEQ/100ML IV SOLN
INTRAVENOUS | Status: AC
  Administered 2020-12-14: 10 meq
  Filled 2020-12-14: qty 100

## 2020-12-14 MED ORDER — METHYLPREDNISOLONE SODIUM SUCC 40 MG IJ SOLR
20.0000 mg | Freq: Every day | INTRAMUSCULAR | Status: DC
  Administered 2020-12-14: 20 mg via INTRAVENOUS
  Filled 2020-12-14: qty 1

## 2020-12-14 MED ORDER — FLUTICASONE FUROATE-VILANTEROL 100-25 MCG/ACT IN AEPB: 1.0000 | INHALATION_SPRAY | Freq: Every day | RESPIRATORY_TRACT | Status: DC | PRN

## 2020-12-14 MED ORDER — BUDESON-GLYCOPYRROL-FORMOTEROL 160-9-4.8 MCG/ACT IN AERO: 2.0000 | INHALATION_SPRAY | Freq: Two times a day (BID) | RESPIRATORY_TRACT | Status: DC | PRN

## 2020-12-14 MED ORDER — FERROUS SULFATE 325 (65 FE) MG PO TABS
325.0000 mg | ORAL_TABLET | Freq: Every day | ORAL | Status: DC
  Administered 2020-12-15 – 2020-12-22 (×7): 325 mg via ORAL
  Filled 2020-12-14 (×7): qty 1

## 2020-12-14 MED ORDER — METOPROLOL SUCCINATE ER 25 MG PO TB24: 25.0000 mg | ORAL_TABLET | ORAL | Status: DC

## 2020-12-14 MED ORDER — POLYVINYL ALCOHOL 1.4 % OP SOLN
1.0000 [drp] | Freq: Every day | OPHTHALMIC | Status: DC | PRN
  Administered 2020-12-14: 22:00:00 1 [drp] via OPHTHALMIC
  Filled 2020-12-14: qty 15

## 2020-12-14 MED ORDER — ALBUTEROL SULFATE (2.5 MG/3ML) 0.083% IN NEBU: 3.0000 mL | INHALATION_SOLUTION | Freq: Four times a day (QID) | RESPIRATORY_TRACT | Status: DC | PRN

## 2020-12-14 MED ORDER — ALLOPURINOL 100 MG PO TABS
100.0000 mg | ORAL_TABLET | Freq: Every morning | ORAL | Status: DC
  Administered 2020-12-15 – 2020-12-23 (×9): 100 mg via ORAL
  Filled 2020-12-14 (×9): qty 1

## 2020-12-14 MED ORDER — UMECLIDINIUM BROMIDE 62.5 MCG/ACT IN AEPB: 1.0000 | INHALATION_SPRAY | Freq: Every day | RESPIRATORY_TRACT | Status: DC | PRN

## 2020-12-14 MED ORDER — HYDROXYCHLOROQUINE SULFATE 200 MG PO TABS
200.0000 mg | ORAL_TABLET | Freq: Every morning | ORAL | Status: DC
  Administered 2020-12-15 – 2020-12-23 (×9): 200 mg via ORAL
  Filled 2020-12-14 (×9): qty 1

## 2020-12-14 MED ORDER — ADULT MULTIVITAMIN W/MINERALS CH
1.0000 | ORAL_TABLET | Freq: Every day | ORAL | Status: DC
  Administered 2020-12-15 – 2020-12-22 (×7): 1 via ORAL
  Filled 2020-12-14 (×7): qty 1

## 2020-12-14 MED ORDER — FOLIC ACID 1 MG PO TABS
1.0000 mg | ORAL_TABLET | Freq: Every day | ORAL | Status: DC
  Administered 2020-12-16 – 2020-12-22 (×6): 1 mg via ORAL
  Filled 2020-12-14 (×6): qty 1

## 2020-12-14 MED ORDER — NINTEDANIB ESYLATE 150 MG PO CAPS: 150.0000 mg | ORAL_CAPSULE | Freq: Every day | ORAL | Status: DC

## 2020-12-14 MED ORDER — FLUTICASONE PROPIONATE 50 MCG/ACT NA SUSP
2.0000 | Freq: Every day | NASAL | Status: DC | PRN
  Filled 2020-12-14: qty 16

## 2020-12-14 MED ORDER — INFLUENZA VAC A&B SA ADJ QUAD 0.5 ML IM PRSY
0.5000 mL | PREFILLED_SYRINGE | INTRAMUSCULAR | Status: DC
  Filled 2020-12-14: qty 0.5

## 2020-12-14 MED ORDER — LEVOTHYROXINE SODIUM 25 MCG PO TABS
25.0000 ug | ORAL_TABLET | Freq: Every day | ORAL | Status: DC
  Administered 2020-12-15 – 2020-12-23 (×8): 25 ug via ORAL
  Filled 2020-12-14 (×9): qty 1

## 2020-12-14 MED ORDER — CETIRIZINE HCL 5 MG/5ML PO SOLN
5.0000 mg | Freq: Every day | ORAL | Status: DC
  Administered 2020-12-14 – 2020-12-22 (×9): 5 mg via ORAL
  Filled 2020-12-14 (×10): qty 5

## 2020-12-14 NOTE — ED Notes (Signed)
Patient incont of stool. Cleaned up and new brief put on

## 2020-12-14 NOTE — ED Notes (Addendum)
ED TO INPATIENT HANDOFF REPORT  ED Nurse Name and Phone #: [place] [phone]  S Name/Age/Gender Lindsey Huber 75 y.o. female Room/Bed: 029C/029C  Code Status   Code Status: Prior  Home/SNF/Other Home Patient oriented to: self, place, time, and situation Is this baseline? Yes   Triage Complete: Triage complete  Chief Complaint Acute GI bleeding [K92.2]  Triage Note No notes on file   Allergies Allergies  Allergen Reactions   Propofol Other (See Comments)    Caused asthma   Flecainide Other (See Comments)    Unknown reaction   Amiodarone Other (See Comments)    Delerium/Confusion/Psychosis,    Amlodipine Other (See Comments)    Tired, syncope    Level of Care/Admitting Diagnosis ED Disposition     ED Disposition  Admit   Condition  --   [place]: [facility] [place] [[id number]]  Level of Care: Progressive [102]  Admit to Progressive based on following criteria: GI, ENDOCRINE disease patients with GI bleeding, acute liver failure or pancreatitis, stable with diabetic ketoacidosis or thyrotoxicosis (hypothyroid) state.  May admit patient to [facility] or [facility] if equivalent level of care is available:: No  Covid Evaluation: Asymptomatic Screening Protocol (No Symptoms)  Diagnosis: Acute GI bleeding [253168]  Admitting Physician: Orene Desanctis [id number]  Attending Physician: Orene Desanctis [id number]  Estimated length of stay: past midnight tomorrow  Certification:: I certify this patient will need inpatient services for at least 2 midnights          B Medical/Surgery History Past Medical History:  Diagnosis Date   A-fib ([place])    Anemia    Cardiomyopathy ([place])    CHF (congestive heart failure) ([place])    CKD (chronic kidney disease)    Diverticulosis    Hypertension    Lupus ([place])    Osteoarthritis    Pulmonary fibrosis ([place])    Vitamin D deficiency    Past Surgical History:  Procedure Laterality Date   CARDIAC VALVE  SURGERY     CARPAL TUNNEL RELEASE     HEMORRHOID SURGERY     PACEMAKER INSERTION     RIGHT/LEFT HEART CATH AND CORONARY ANGIOGRAPHY N/A 11/27/2020   Procedure: RIGHT/LEFT HEART CATH AND CORONARY ANGIOGRAPHY;  Surgeon: Jolaine Artist, MD;  Location: [facility];  Service: Cardiovascular;  Laterality: N/A;   TONSILLECTOMY       A IV Location/Drains/Wounds Patient Lines/Drains/Airways Status     Active Line/Drains/Airways     Name Placement date Placement time Site Days   Peripheral IV 12/13/20 Anterior;Left Forearm 12/13/20  1940  Forearm  1   Peripheral IV 12/13/20 20 G Right Antecubital 12/13/20  2019  Antecubital  1   Peripheral IV 12/14/20 20 G Anterior;Left;Upper Arm 12/14/20  0744  Arm  less than 1            Intake/Output Last 24 hours  Intake/Output Summary (Last 24 hours) at 12/14/2020 1329 Last data filed at 12/14/2020 [id number] Gross per 24 hour  Intake 2136.49 ml  Output --  Net 2136.49 ml    Labs/Imaging Results for orders placed or performed during the hospital encounter of 12/13/20 (from the past 48 hour(s))  CBG monitoring, ED     Status: Abnormal   Collection Time: 12/13/20  6:29 PM  Result Value Ref Range   Glucose-Capillary 109 (H) 70 - 99 mg/dL    Comment: Glucose reference range applies only to samples taken after fasting for at least 8 hours.  Brain natriuretic peptide     Status: Abnormal   Collection Time: 12/13/20  6:46 PM  Result Value Ref Range   B Natriuretic Peptide 117.6 (H) 0.0 - 100.0 pg/mL    Comment: Performed at [facility] [address] [place], [place] [address]  CBC with Differential     Status: Abnormal   Collection Time: 12/13/20  6:46 PM  Result Value Ref Range   WBC 5.5 4.0 - 10.5 K/uL   RBC 1.83 (L) 3.87 - 5.11 MIL/uL   Hemoglobin 6.0 (LL) 12.0 - 15.0 g/dL    Comment: REPEATED TO VERIFY THIS CRITICAL RESULT HAS VERIFIED AND BEEN CALLED TO MATT BRUCK,RN BY BILLEE WYLIE ON 11 19 2022 AT 2018, AND HAS BEEN  READ BACK.     HCT 19.2 (L) 36.0 - 46.0 %   MCV 104.9 (H) 80.0 - 100.0 fL   MCH 32.8 26.0 - 34.0 pg   MCHC 31.3 30.0 - 36.0 g/dL   RDW 13.3 11.5 - 15.5 %   Platelets 91 (L) 150 - 400 K/uL    Comment: Immature Platelet Fraction may be clinically indicated, consider ordering this additional test [id number] REPEATED TO VERIFY PLATELET COUNT CONFIRMED BY SMEAR    nRBC 0.0 0.0 - 0.2 %   Neutrophils Relative % 50 %   Neutro Abs 2.7 1.7 - 7.7 K/uL   Lymphocytes Relative 44 %   Lymphs Abs 2.4 0.7 - 4.0 K/uL   Monocytes Relative 5 %   Monocytes Absolute 0.3 0.1 - 1.0 K/uL   Eosinophils Relative 1 %   Eosinophils Absolute 0.0 0.0 - 0.5 K/uL   Basophils Relative 0 %   Basophils Absolute 0.0 0.0 - 0.1 K/uL   Immature Granulocytes 0 %   Abs Immature Granulocytes 0.01 0.00 - 0.07 K/uL    Comment: Performed at [facility], [address], [place], [place] [address]  Comprehensive metabolic panel     Status: Abnormal   Collection Time: 12/13/20  6:46 PM  Result Value Ref Range   Sodium 140 135 - 145 mmol/L   Potassium 2.8 (L) 3.5 - 5.1 mmol/L   Chloride 118 (H) 98 - 111 mmol/L   CO2 17 (L) 22 - 32 mmol/L   Glucose, Bld 92 70 - 99 mg/dL    Comment: Glucose reference range applies only to samples taken after fasting for at least 8 hours.   BUN 15 8 - 23 mg/dL   Creatinine, Ser 0.75 0.44 - 1.00 mg/dL   Calcium 6.2 (LL) 8.9 - 10.3 mg/dL    Comment: CRITICAL RESULT CALLED TO, READ BACK BY AND VERIFIED WITH: Selena Batten RN BY SSTEPHENS 2015 [id number]    Total Protein 4.4 (L) 6.5 - 8.1 g/dL   Albumin 1.8 (L) 3.5 - 5.0 g/dL   AST 26 15 - 41 U/L   ALT 17 0 - 44 U/L   Alkaline Phosphatase 27 (L) 38 - 126 U/L   Total Bilirubin 0.6 0.3 - 1.2 mg/dL   GFR, Estimated >60 >60 mL/min    Comment: (NOTE) Calculated using the CKD-EPI Creatinine Equation (2021)    Anion gap 5 5 - 15    Comment: Performed at [facility], [place] [address], [place], [place] [address]  Resp Panel by RT-PCR (Flu  A&B, Covid) Nasopharyngeal Swab     Status: None   Collection Time: 12/13/20  6:47 PM   Specimen: Nasopharyngeal Swab; Nasopharyngeal(NP) swabs in vial transport medium  Result Value  Ref Range   SARS Coronavirus 2 by RT PCR NEGATIVE NEGATIVE    Comment: (NOTE) SARS-CoV-2 target nucleic acids are NOT DETECTED.  The SARS-CoV-2 RNA is generally detectable in upper respiratory specimens during the acute phase of infection. The lowest concentration of SARS-CoV-2 viral copies this assay can detect is 138 copies/mL. A negative result does not preclude SARS-Cov-2 infection and should not be used as the sole basis for treatment or other patient management decisions. A negative result may occur with  improper specimen collection/handling, submission of specimen other than nasopharyngeal swab, presence of viral mutation(s) within the areas targeted by this assay, and inadequate number of viral copies(<138 copies/mL). A negative result must be combined with clinical observations, patient history, and epidemiological information. The expected result is Negative.  Fact Sheet for Patients:  EntrepreneurPulse.com.au  Fact Sheet for Healthcare Providers:  IncredibleEmployment.be  This test is no t yet approved or cleared by the [place] FDA and  has been authorized for detection and/or diagnosis of SARS-CoV-2 by FDA under an Emergency Use Authorization (EUA). This EUA will remain  in effect (meaning this test can be used) for the duration of the COVID-19 declaration under Section 564(b)(1) of the Act, 21 U.S.C.section 360bbb-3(b)(1), unless the authorization is terminated  or revoked sooner.       Influenza A by PCR NEGATIVE NEGATIVE   Influenza B by PCR NEGATIVE NEGATIVE    Comment: (NOTE) The Xpert Xpress SARS-CoV-2/FLU/RSV plus assay is intended as an aid in the diagnosis of influenza from Nasopharyngeal swab specimens and should not be used as a  sole basis for treatment. Nasal washings and aspirates are unacceptable for Xpert Xpress SARS-CoV-2/FLU/RSV testing.  Fact Sheet for Patients: EntrepreneurPulse.com.au  Fact Sheet for Healthcare Providers: IncredibleEmployment.be  This test is not yet approved or cleared by the [place] FDA and has been authorized for detection and/or diagnosis of SARS-CoV-2 by FDA under an Emergency Use Authorization (EUA). This EUA will remain in effect (meaning this test can be used) for the duration of the COVID-19 declaration under Section 564(b)(1) of the Act, 21 U.S.C. section 360bbb-3(b)(1), unless the authorization is terminated or revoked.  Performed at [facility], [place] [address] [place], [place] [address]   POC occult blood, ED Provider will collect     Status: Abnormal   Collection Time: 12/13/20  6:50 PM  Result Value Ref Range   Fecal Occult Bld POSITIVE (A) NEGATIVE  ABO/Rh     Status: None   Collection Time: 12/13/20  6:55 PM  Result Value Ref Range   ABO/RH(D)      A NEG Performed at [facility] [address], [place], [place] [address]   Type and screen [place]     Status: None   Collection Time: 12/13/20  7:00 PM  Result Value Ref Range   ABO/RH(D) A NEG    Antibody Screen NEG    Sample Expiration 12/16/2020,2359    Unit Number [id number]    Blood Component Type RED CELLS,LR    Unit division 00    Status of Unit ISSUED,FINAL    Unit tag comment EMERGENCY RELEASE    Transfusion Status OK TO TRANSFUSE    Crossmatch Result COMPATIBLE    Unit Number [id number]    Blood Component Type RED CELLS,LR    Unit division 00    Status of Unit ISSUED,FINAL    Transfusion Status OK TO TRANSFUSE    Crossmatch Result  Compatible Performed at [facility], [place] [address], [place], [place] [address]   I-stat chem 8, ED     Status: Abnormal   Collection Time: 12/13/20  7:28 PM  Result  Value Ref Range   Sodium 145 135 - 145 mmol/L   Potassium 2.9 (L) 3.5 - 5.1 mmol/L   Chloride 116 (H) 98 - 111 mmol/L   BUN 15 8 - 23 mg/dL   Creatinine, Ser 0.70 0.44 - 1.00 mg/dL   Glucose, Bld 87 70 - 99 mg/dL    Comment: Glucose reference range applies only to samples taken after fasting for at least 8 hours.   Calcium, Ion 0.90 (L) 1.15 - 1.40 mmol/L   TCO2 19 (L) 22 - 32 mmol/L   Hemoglobin 5.1 (LL) 12.0 - 15.0 g/dL   HCT 15.0 (L) 36.0 - 46.0 %   Comment NOTIFIED PHYSICIAN   Prepare RBC (crossmatch)     Status: None   Collection Time: 12/13/20  7:30 PM  Result Value Ref Range   Order Confirmation      ORDER PROCESSED BY BLOOD BANK Performed at [facility], [place] [address] [place], [place] [address]   Lactic acid, plasma     Status: None   Collection Time: 12/13/20  8:00 PM  Result Value Ref Range   Lactic Acid, Venous 1.2 0.5 - 1.9 mmol/L    Comment: Performed at [facility], [place] [address] [place], [place] [address]  Prepare RBC (crossmatch)     Status: None   Collection Time: 12/13/20  8:47 PM  Result Value Ref Range   Order Confirmation      ORDER PROCESSED BY BLOOD BANK Performed at [facility], [place] [address], [place], [place] [address]   Troponin I (High Sensitivity)     Status: Abnormal   Collection Time: 12/13/20  9:33 PM  Result Value Ref Range   Troponin I (High Sensitivity) 70 (H) <18 ng/L    Comment: (NOTE) Elevated high sensitivity troponin I (hsTnI) values and significant  changes across serial measurements may suggest ACS but many other  chronic and acute conditions are known to elevate hsTnI results.  Refer to the "Links" section for chest pain algorithms and additional  guidance. Performed at [facility], [place] [address], [place], [place] [address]   Lactic acid, plasma     Status: None   Collection Time: 12/13/20  9:33 PM  Result Value Ref Range   Lactic Acid, Venous 1.0 0.5 - 1.9 mmol/L    Comment: Performed at  [facility] [address] [place], [place] [address]  Magnesium     Status: Abnormal   Collection Time: 12/13/20  9:33 PM  Result Value Ref Range   Magnesium 1.6 (L) 1.7 - 2.4 mg/dL    Comment: Performed at [facility] [address], [place], [place] [address]  Hemoglobin and hematocrit, blood     Status: Abnormal   Collection Time: 12/14/20  1:35 AM  Result Value Ref Range   Hemoglobin 9.1 (L) 12.0 - 15.0 g/dL    Comment: REPEATED TO VERIFY POST TRANSFUSION SPECIMEN    HCT 27.2 (L) 36.0 - 46.0 %    Comment: Performed at [facility] [address], [place], [place] [address]  Hemoglobin and hematocrit, blood     Status: Abnormal   Collection Time: 12/14/20  5:35 AM  Result Value Ref Range   Hemoglobin 8.7 (  L) 12.0 - 15.0 g/dL   HCT 79.0 (L) 24.0 - 97.3 %    Comment: Performed at [facility], [address] [place], [place] [address]  Hemoglobin and hematocrit, blood     Status: Abnormal   Collection Time: 12/14/20  7:35 AM  Result Value Ref Range   Hemoglobin 8.9 (L) 12.0 - 15.0 g/dL   HCT 24.2 (L) 68.3 - 41.9 %    Comment: Performed at [facility], [address], [place], [place] [address]  Hemoglobin and hematocrit, blood     Status: Abnormal   Collection Time: 12/14/20  9:32 AM  Result Value Ref Range   Hemoglobin 9.5 (L) 12.0 - 15.0 g/dL   HCT 79.8 (L) 92.1 - 19.4 %    Comment: Performed at [facility], [address] [place], [place] [address]  Hemoglobin and hematocrit, blood     Status: Abnormal   Collection Time: 12/14/20 12:10 PM  Result Value Ref Range   Hemoglobin 9.1 (L) 12.0 - 15.0 g/dL   HCT 14.4 (L) 81.8 - 56.3 %    Comment: Performed at [facility], [address], [place], [place] [address]   DG Chest Portable 1 View  Result Date: 12/13/2020 CLINICAL DATA:  Shortness of breath EXAM: PORTABLE CHEST 1 VIEW COMPARISON:  05/12/2020 FINDINGS: Cardiac shadow is enlarged. Postsurgical changes and  defibrillator are again seen and stable. Mild vascular congestion is again noted. No focal infiltrate or effusion is seen. No bony abnormality is noted. IMPRESSION: Mild vascular congestion stable from the prior exam. Electronically Signed   By: Alcide Clever M.D.   On: 12/13/2020 19:54   CT ANGIO GI BLEED  Result Date: 12/13/2020 CLINICAL DATA:  Bright red blood per rectum, hypertension EXAM: CTA ABDOMEN AND PELVIS WITHOUT AND WITH CONTRAST TECHNIQUE: Multidetector CT imaging of the abdomen and pelvis was performed using the standard protocol during bolus administration of intravenous contrast. Multiplanar reconstructed images and MIPs were obtained and reviewed to evaluate the vascular anatomy. CONTRAST:  OMNIPAQUE IOHEXOL 350 MG/ML SOLN COMPARISON:  06/30/2020 FINDINGS: VASCULAR Aorta: Minimal atherosclerotic calcification. Normal caliber; no aneurysm or dissection. No periaortic inflammatory change. Celiac: Patent without evidence of aneurysm, dissection, vasculitis or significant stenosis. SMA: Patent without evidence of aneurysm, dissection, vasculitis or significant stenosis. Renals: Single right and dual left renal arteries are widely patent demonstrate normal vascular morphology. No aneurysm or dissection. IMA: Patent without evidence of aneurysm, dissection, vasculitis or significant stenosis. Inflow: Patent without evidence of aneurysm, dissection, vasculitis or significant stenosis. Proximal Outflow: Bilateral common femoral and visualized portions of the superficial and profunda femoral arteries are patent without evidence of aneurysm, dissection, vasculitis or significant stenosis. Veins: Unremarkable Review of the MIP images confirms the above findings. NON-VASCULAR Lower chest: Ground-glass pulmonary infiltrate with associated intra and interlobular septal thickening and traction bronchiectasis in keeping with pulmonary fibrosis again noted at the visualized lung bases, similar to prior  examination. Tricuspid, mitral, and possible aortic valve replacement has been performed. Pacemaker leads are seen within the right atrium and right ventricle. Moderate cardiomegaly with biatrial enlargement. Reflux of contrast into the hepatic venous system is in keeping with at least some degree of right heart failure or tricuspid regurgitation. Hepatobiliary: Cholelithiasis without pericholecystic inflammatory change. Gallbladder unremarkable. No intra or extrahepatic biliary ductal dilation. Pancreas: Unremarkable Spleen: Unremarkable Adrenals/Urinary Tract: Adrenal glands are unremarkable. Kidneys are normal, without renal calculi, focal lesion, or hydronephrosis. Bladder is unremarkable. Stomach/Bowel: There  is active gastrointestinal hemorrhage involving the mid descending colon, best seen on axial image # 62/6 and coronal image # 96/11. The underlying etiology of the hemorrhage is not clearly identified, however, this most likely represents a small diverticular hemorrhage. There is background moderate sigmoid diverticulosis. The stomach, small bowel, and large bowel are otherwise unremarkable. The appendix is not clearly identified and is likely absent. Lymphatic: No pathologic adenopathy within the abdomen and pelvis. Reproductive: Uterus and bilateral adnexa are unremarkable. Other: No abdominal wall hernia.  The rectum is unremarkable. Musculoskeletal: There is grade 2 anterolisthesis of L4-5 with superimposed advanced degenerative changes. Resultant marked central canal stenosis best seen on axial image # 70/6. Superior endplate fracture of L3 with approximately 10-20% loss of height noted. Osseous structures are diffusely osteopenic. No acute bone abnormality identified. No lytic or blastic bone lesions. IMPRESSION: VASCULAR No acute findings. NON-VASCULAR Active gastrointestinal hemorrhage involving the mid descending colon likely representing an active diverticular hemorrhage. Stable bibasilar  pulmonary fibrotic change. Moderate cardiomegaly with biatrial enlargement and at least some degree of right heart failure or tricuspid regurgitation. Cholelithiasis. Moderate sigmoid diverticulosis. Grade 2 anterolisthesis L4-5 with superimposed advanced degenerative change. Resultant marked central canal stenosis. These results were called by telephone at the time of interpretation on 12/13/2020 at 9:52 pm to provider [facility] , who verbally acknowledged these results. Electronically Signed   By: Fidela Salisbury M.D.   On: 12/13/2020 21:54    Pending Labs Unresulted Labs (From admission, onward)     Start     Ordered   12/14/20 0135  Hemoglobin and hematocrit, blood  Now then every 2 hours,   R (with TIMED occurrences)      12/14/20 0134            Vitals/Pain Today's Vitals   12/14/20 0845 12/14/20 0900 12/14/20 1200 12/14/20 1315  BP: (!) 102/51 (!) 105/57 107/60 (!) 121/97  Pulse: 70 70 68 70  Resp: (!) 22 18 (!) 21 20  Temp:      TempSrc:      SpO2: 99% 100% 100% 100%  Weight:      PainSc:        Isolation Precautions No active isolations  Medications Medications  potassium chloride 10 mEq in 100 mL IVPB (0 mEq Intravenous Stopped 12/14/20 0356)  Nintedanib (OFEV) CAPS 150 mg (has no administration in time range)  methylPREDNISolone sodium succinate (SOLU-MEDROL) 40 mg/mL injection 20 mg (20 mg Intravenous Given 12/14/20 0917)  fluticasone furoate-vilanterol (BREO ELLIPTA) 100-25 MCG/ACT 1 puff (has no administration in time range)    And  umeclidinium bromide (INCRUSE ELLIPTA) 62.5 MCG/ACT 1 puff (has no administration in time range)  0.9 %  sodium chloride infusion (Manually program via Guardrails IV Fluids) (0 mLs Intravenous Stopped 12/13/20 2350)  pantoprazole (PROTONIX) injection 40 mg (40 mg Intravenous Given 12/13/20 1944)  calcium gluconate 1 g/ 50 mL sodium chloride IVPB (0 mg Intravenous Stopped 12/13/20 2350)  potassium chloride SA (KLOR-CON) CR  tablet 40 mEq (40 mEq Oral Given 12/13/20 2120)  0.9 %  sodium chloride infusion (Manually program via Guardrails IV Fluids) (0 mLs Intravenous Stopped 12/13/20 2350)  iohexol (OMNIPAQUE) 350 MG/ML injection 100 mL (100 mLs Intravenous Contrast Given 12/13/20 2117)  prothrombin complex conc human (KCENTRA) IVPB 2,669 Units (0 Units Intravenous Stopped 12/14/20 0046)  magnesium sulfate IVPB 2 g 50 mL (0 g Intravenous Stopped 12/14/20 0233)  potassium chloride 10 MEQ/100ML IVPB (0 mEq  Stopped 12/14/20 0612)  Mobility walks with device Moderate fall risk   Focused Assessments Cardiac Assessment Handoff:  Cardiac Rhythm: Other (Comment) (paced) No results found for: CKTOTAL, CKMB, CKMBINDEX, TROPONINI No results found for: DDIMER Does the Patient currently have chest pain? No   , Neuro Assessment Handoff:  Swallow screen pass? Yes  Cardiac Rhythm: Other (Comment) (paced)       Neuro Assessment: Within Defined Limits Neuro Checks:      Last Documented NIHSS Modified Score:   Has TPA been given? No If patient is a Neuro Trauma and patient is going to OR before floor call report to [place] nurse: [phone] or [phone]   R Recommendations: See Admitting Provider Note  Report given to:   Additional Notes: none

## 2020-12-14 NOTE — Progress Notes (Signed)
PROGRESS NOTE    Lindsey Huber  [id number] DOB: November 06, 1945 DOA: 12/13/2020 PCP: Scheryl Marten, PA    Brief Narrative:  [name] was admitted to the hospital with the working diagnosis of acute blood loss anemia due to lower GI bleed.   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   Reported intermittent chest pain for 2 days leading into her hospitalization, associated with dyspnea, fatigue and diffuse abdominal pain.  Positive melena.  Because of persistent symptoms she came to the hospital, for further evaluation.  On her initial physical examination her blood pressure was 60/30, she received IV fluids and improved to 122/88, heart rate 75, respiratory rate 16, temperature 98.6, oxygen saturation 100%, her lungs are clear to auscultation bilaterally, heart S1-S2, present, rhythmic, her abdomen was diffusely tender to palpation with no guarding or rebound, no lower extremity edema.  Sodium 140, potassium 2.8, chloride 118, bicarb 17, glucose 92, BUN 15, creatinine 0.72, calcium 6.2, albumin 1.8, AST 26, ALT 17, white count 5.5, hemoglobin 6.0, hematocrit 19.2, platelets 91 SARS COVID-19 negative  Chest radiograph with cardiomegaly, no infiltrates.  EKG 84 bpm, rightward axis, right bundle branch block, PACs with ventricular paced rhythm, no significant ST segment or T wave changes.  CT angiography show acute gastrointestinal hemorrhage involving the mid descending colon likely representing an active diverticular hemorrhage.  Stable bibasilar pulmonary fibrotic changes.  Moderate cardiomegaly.  Positive cholelithiasis.  Moderate sigmoid  diverticulosis.  Patient received 2 units packed red blood cell transfusion. Anticoagulation was held and received Kcentra for reversal. IR was consulted with recommendations to reverse anticoagulation, and close follow-up, with plan of angiogram and possible embolization if worsening bleeding.   Assessment & Plan:   Principal Problem:   Acute GI bleeding Active Problems:   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HCC)   Systemic lupus erythematosus (HCC)   Chronic heart failure with preserved ejection fraction (HCC)   Non-rheumatic atrial fibrillation (HCC)   Secondary adrenal insufficiency (HCC)   Hypotension   Hypokalemia   Hypocalcemia   Hypomagnesemia   Acute blood loss anemia due to lower GI bleed, diverticular bleed, symptomatic anemia. Iron deficiency  Patient is sp 2 units PRBC with good toleration, follow up hgb is 9,1 and hct 27.4 No dyspnea or chest pain, blood pressure continue to be stable with systolic [id number]. No further melena during this morning Sp prothrombin complex infusion.   Plan to continue close hemodynamic monitoring Check cell count in am. If worsening bleeding will need IR embolization.  Continue with clear liquid diet for today.  Resume oral iron supplementation   2. Valvular heart disease, with chronic diastolic heart failure. Bioprosthetic valves  Continue to close blood pressure monitoring, no signs of heart failure decompensation.   3. Paroxysmal atrial fibrillation. Currently paced rhythm, continue telemetry monitoring, Holding anticoagulation due to acute blood loss anemia.   4. Hyponatremia, hypokalemia, hypomagnesemia and hypocalcemia/ non anion gap metabolic acidosis Follow up renal function with serum cr at 0,70, K is 2,9 and serum bicarbonate at 17.  Check BMP this pm and continue K correction. Continue to hold on IV fluids for now.   5. SLE and ILD  No signs of acute exacerbation.  Continue with Nintedanib and resume  hydroxychloroquine  6. Secondary adrenal insufficiency. Continue with systemic steroids and close blood pressure monitoring   7. Hypothyroid. Continue with levothyroxine   8. HTN Continue holding metoprolol and furosemide to prevent hypotension.   Patient continue to be at high risk for worsening anemia   Status is: Inpatient  Remains inpatient appropriate because: hemodynamic monitoring    DVT prophylaxis: Scd   Code Status:    full Family Communication:  No family at the bedside    Consultants:  IR  Subjective: Patient with no dyspnea or chest pain, no nausea or vomiting, this am with no further melena   Objective: Vitals:   12/14/20 1200 12/14/20 1315 12/14/20 1357 12/14/20 1401  BP: 107/60 (!) 121/97 (!) 127/52   Pulse: 68 70 73   Resp: (!) 21 20 17    Temp:   97.7 F (36.5 C)   TempSrc:   Oral   SpO2: 100% 100% 100%   Weight:    53.7 kg  Height:    5\' 2"  (1.575 m)    Intake/Output Summary (Last 24 hours) at 12/14/2020 1424 Last data filed at 12/14/2020 [id number] Gross per 24 hour  Intake 2136.49 ml  Output --  Net 2136.49 ml   Filed Weights   12/13/20 2218 12/14/20 1401  Weight: 53.5 kg 53.7 kg    Examination:   General: Not in pain or dyspnea, deconditioned  Neurology: Awake and alert, non focal  E ENT: positive pallor, no icterus, oral mucosa moist Cardiovascular: No JVD. S1-S2 present, rhythmic, positive 3/6 systolic murmur at the right parasternal border. No lower extremity edema. Pulmonary: positive breath sounds bilaterally, adequate air movement, no wheezing, rhonchi or rales. Gastrointestinal. Abdomen soft and non tender Skin. No rashes Musculoskeletal: no joint deformities     Data Reviewed: I have personally reviewed following labs and imaging studies  CBC: Recent Labs  Lab 12/13/20 1846 12/13/20 1928 12/14/20 0135 12/14/20 0535 12/14/20 0735 12/14/20 0932 12/14/20 1210  WBC 5.5  --   --   --   --   --   --   NEUTROABS 2.7  --    --   --   --   --   --   HGB 6.0*   < > 9.1* 8.7* 8.9* 9.5* 9.1*  HCT 19.2*   < > 27.2* 26.3* 26.3* 29.6* 27.4*  MCV 104.9*  --   --   --   --   --   --   PLT 91*  --   --   --   --   --   --    < > = values in this interval not displayed.   Basic Metabolic Panel: Recent Labs  Lab 12/13/20 1846 12/13/20 1928 12/13/20 2133  NA 140 145  --   K 2.8* 2.9*  --   CL 118* 116*  --   CO2 17*  --   --   GLUCOSE 92 87  --   BUN 15 15  --   CREATININE 0.75 0.70  --   CALCIUM 6.2*  --   --   MG  --   --  1.6*   GFR: Estimated Creatinine Clearance: 49.5 mL/min (by C-G formula based on SCr of 0.7 mg/dL). Liver Function Tests: Recent Labs  Lab 12/13/20 1846  AST 26  ALT 17  ALKPHOS 27*  BILITOT 0.6  PROT 4.4*  ALBUMIN 1.8*   No results for input(s): LIPASE, AMYLASE in the last 168 hours. No results for input(s): AMMONIA in  the last 168 hours. Coagulation Profile: No results for input(s): INR, PROTIME in the last 168 hours. Cardiac Enzymes: No results for input(s): CKTOTAL, CKMB, CKMBINDEX, TROPONINI in the last 168 hours. BNP (last 3 results) No results for input(s): PROBNP in the last 8760 hours. HbA1C: No results for input(s): HGBA1C in the last 72 hours. CBG: Recent Labs  Lab 12/13/20 1829  GLUCAP 109*   Lipid Profile: No results for input(s): CHOL, HDL, LDLCALC, TRIG, CHOLHDL, LDLDIRECT in the last 72 hours. Thyroid Function Tests: No results for input(s): TSH, T4TOTAL, FREET4, T3FREE, THYROIDAB in the last 72 hours. Anemia Panel: No results for input(s): VITAMINB12, FOLATE, FERRITIN, TIBC, IRON, RETICCTPCT in the last 72 hours.    Radiology Studies: I have reviewed all of the imaging during this hospital visit personally     Scheduled Meds:  [START ON 12/15/2020] influenza vaccine adjuvanted  0.5 mL Intramuscular Tomorrow-1000   methylPREDNISolone (SOLU-MEDROL) injection  20 mg Intravenous Daily   Nintedanib  150 mg Oral QHS   Continuous Infusions:    LOS: 1 day        Brock Mokry Annett Gula, MD

## 2020-12-14 NOTE — H&P (View-Only) (Signed)
[facility] Consult  Referring Provider: ER Primary Care Physician:  Scheryl Marten, [name] Primary Gastroenterologist: Dr.Claudette Wermuth  Reason for Consultation: Rectal bleeding  HPI: Lindsey Huber is a 75 y.o. female presented to the ER with worsening shortness of breath, chest pain, fatigue, diffuse abdominal pain, diarrhea, rectal bleeding since 1 day. In ER found to be hypotensive, blood pressure 60/30 mmHg, hemoglobin 6, dropped to 5.1(baseline 10.5) CT angio performed in ER showed active bleeding from mid descending colon, likely active diverticular bleeding  Past medical history:chronic diastolic CHF with valvular disease, status post prosthetic aortic and mitral valve replacement, bioprosthetic tricuspid valve replacement, permanent atrial fibrillation, sick sinus syndrome, status post AICD placement, history of thromboembolism, lupus, pulmonary hypertension and interstitial lung disease. Recent cardiac cath on 11/27/2020: EF 60-65, mild PAH  Prior GI work-up: Barium swallow 06/19/2020, possibility of achalasia, EGD with Botox recommended however not deemed appropriate for anesthesia. Past Medical History:  Diagnosis Date   A-fib ([place])    Anemia    Cardiomyopathy ([place])    CHF (congestive heart failure) (H[facility])    CKD (chronic kidney disease)    Diverticulosis    Hypertension    Lupus (H[facility])    Osteoarthritis    Pulmonary fibrosis ([place])    Vitamin D deficiency     Past Surgical History:  Procedure Laterality Date   CARDIAC VALVE SURGERY     CARPAL TUNNEL RELEASE     HEMORRHOID SURGERY     PACEMAKER INSERTION     RIGHT/LEFT HEART CATH AND CORONARY ANGIOGRAPHY N/A 11/27/2020   Procedure: RIGHT/LEFT HEART CATH AND CORONARY ANGIOGRAPHY;  Surgeon: Jolaine Artist, MD;  Location: [facility];  Service: Cardiovascular;  Laterality: N/A;   TONSILLECTOMY      Prior to Admission medications   Medication Sig Start Date End Date Taking? Authorizing Provider   acetaminophen (TYLENOL) 500 MG tablet Take 1,000 mg by mouth every 6 (six) hours as needed (pain).   Yes [provider]  albuterol (VENTOLIN HFA) 108 (90 Base) MCG/ACT inhaler Inhale 2 puffs into the lungs every 6 (six) hours as needed for wheezing or shortness of breath.   Yes [provider]  allopurinol (ZYLOPRIM) 100 MG tablet TAKE 1 TABLET BY MOUTH EVERY DAY Patient taking differently: Take 100 mg by mouth every morning. 11/06/20  Yes Rice, Resa Miner, MD  amoxicillin (AMOXIL) 500 MG capsule Take 2,000 mg by mouth See admin instructions. Take 4 capsules (2000 mg) by mouth one hour prior to dental procedures 03/30/20  Yes [provider]  Budeson-Glycopyrrol-Formoterol (BREZTRI AEROSPHERE) 160-9-4.8 MCG/ACT AERO Inhale 2 puffs into the lungs 2 (two) times daily as needed (shortness of breath).   Yes [provider]  Calcium Carbonate-Vitamin D (CALTRATE 600+D PO) Take 1 capsule by mouth daily with lunch.   Yes [provider]  cetirizine HCl (ZYRTEC) 5 MG/5ML SOLN Take 5 mg by mouth at bedtime.   Yes [provider]  Cholecalciferol 25 MCG (1000 UT) tablet Take 1,000 Units by mouth daily with lunch.   Yes [provider]  ferrous sulfate 325 (65 FE) MG tablet Take 325 mg by mouth daily with lunch.   Yes [provider]  fluticasone (FLONASE) 50 MCG/ACT nasal spray Place 2 sprays into both nostrils daily as needed for allergies.   Yes [provider]  folic acid (FOLVITE) Q000111Q MCG tablet Take 800 mcg by mouth daily with lunch.   Yes [provider]  furosemide (LASIX) 20 MG tablet  Take 20 mg by mouth See admin instructions. Take one tablet (20 mg) by mouth every morning, Hold if BP is lower than 110/60   Yes [provider]  hydroxychloroquine (PLAQUENIL) 200 MG tablet Take 1 tablet (200 mg total) by mouth daily. Patient taking differently: Take 200 mg by mouth every morning. 08/20/20  Yes Rice,  Resa Miner, MD  levothyroxine (SYNTHROID) 25 MCG tablet Take 1 tablet (25 mcg total) by mouth daily. Patient taking differently: Take 25 mcg by mouth daily before breakfast. 08/22/20  Yes Shamleffer, Melanie Crazier, MD  metoprolol succinate (TOPROL-XL) 25 MG 24 hr tablet Take 25 mg by mouth See admin instructions. Take one tablet (25 mg) by mouth every morning, Hold dose if <110 SBP or <60 DBP 06/18/20  Yes [provider]  Multiple Vitamins-Minerals (MULTIVITAMIN WITH MINERALS) tablet Take 1 tablet by mouth daily with lunch.   Yes [provider]  Nintedanib (OFEV) 150 MG CAPS Take 150 mg by mouth at bedtime.   Yes [provider]  polyvinyl alcohol (LIQUIFILM TEARS) 1.4 % ophthalmic solution Place 1 drop into both eyes daily as needed for dry eyes.   Yes [provider]  predniSONE (DELTASONE) 5 MG tablet Take 1 tablet (5 mg total) by mouth daily with breakfast. Patient to double up dose during sick day rule Patient taking differently: Take 5 mg by mouth See admin instructions. Take one tablet (5 mg) by mouth every morning, double up dose during sick day rule 08/20/20  Yes Shamleffer, Melanie Crazier, MD  rivaroxaban (XARELTO) 20 MG TABS tablet Take 1 tablet (20 mg total) by mouth daily with supper. 07/17/20  Yes Vickie Epley, MD  vitamin B-12 (CYANOCOBALAMIN) 1000 MCG tablet Take 1,000 mcg by mouth daily with lunch.   Yes [provider]    Current Facility-Administered Medications  Medication Dose Route Frequency Provider Last Rate Last Admin   fluticasone furoate-vilanterol (BREO ELLIPTA) 100-25 MCG/ACT 1 puff  1 puff Inhalation Daily PRN Tu, Ching T, DO       And   umeclidinium bromide (INCRUSE ELLIPTA) 62.5 MCG/ACT 1 puff  1 puff Inhalation Daily PRN Tu, Ching T, DO       methylPREDNISolone sodium succinate (SOLU-MEDROL) 40 mg/mL injection 20 mg  20 mg Intravenous Daily Tu, Ching T, DO   20 mg at 12/14/20 [id number]   Nintedanib (OFEV) CAPS 150  mg  150 mg Oral QHS Tu, Ching T, DO       Current Outpatient Medications  Medication Sig Dispense Refill   acetaminophen (TYLENOL) 500 MG tablet Take 1,000 mg by mouth every 6 (six) hours as needed (pain).     albuterol (VENTOLIN HFA) 108 (90 Base) MCG/ACT inhaler Inhale 2 puffs into the lungs every 6 (six) hours as needed for wheezing or shortness of breath.     allopurinol (ZYLOPRIM) 100 MG tablet TAKE 1 TABLET BY MOUTH EVERY DAY (Patient taking differently: Take 100 mg by mouth every morning.) 90 tablet 0   amoxicillin (AMOXIL) 500 MG capsule Take 2,000 mg by mouth See admin instructions. Take 4 capsules (2000 mg) by mouth one hour prior to dental procedures     Budeson-Glycopyrrol-Formoterol (BREZTRI AEROSPHERE) 160-9-4.8 MCG/ACT AERO Inhale 2 puffs into the lungs 2 (two) times daily as needed (shortness of breath).     Calcium Carbonate-Vitamin D (CALTRATE 600+D PO) Take 1 capsule by mouth daily with lunch.     cetirizine HCl (ZYRTEC) 5 MG/5ML SOLN Take 5 mg by mouth at bedtime.  Cholecalciferol 25 MCG (1000 UT) tablet Take 1,000 Units by mouth daily with lunch.     ferrous sulfate 325 (65 FE) MG tablet Take 325 mg by mouth daily with lunch.     fluticasone (FLONASE) 50 MCG/ACT nasal spray Place 2 sprays into both nostrils daily as needed for allergies.     folic acid (FOLVITE) Q000111Q MCG tablet Take 800 mcg by mouth daily with lunch.     furosemide (LASIX) 20 MG tablet Take 20 mg by mouth See admin instructions. Take one tablet (20 mg) by mouth every morning, Hold if BP is lower than 110/60     hydroxychloroquine (PLAQUENIL) 200 MG tablet Take 1 tablet (200 mg total) by mouth daily. (Patient taking differently: Take 200 mg by mouth every morning.) 90 tablet 0   levothyroxine (SYNTHROID) 25 MCG tablet Take 1 tablet (25 mcg total) by mouth daily. (Patient taking differently: Take 25 mcg by mouth daily before breakfast.) 90 tablet 3   metoprolol succinate (TOPROL-XL) 25 MG 24 hr tablet Take 25  mg by mouth See admin instructions. Take one tablet (25 mg) by mouth every morning, Hold dose if <110 SBP or <60 DBP     Multiple Vitamins-Minerals (MULTIVITAMIN WITH MINERALS) tablet Take 1 tablet by mouth daily with lunch.     Nintedanib (OFEV) 150 MG CAPS Take 150 mg by mouth at bedtime.     polyvinyl alcohol (LIQUIFILM TEARS) 1.4 % ophthalmic solution Place 1 drop into both eyes daily as needed for dry eyes.     predniSONE (DELTASONE) 5 MG tablet Take 1 tablet (5 mg total) by mouth daily with breakfast. Patient to double up dose during sick day rule (Patient taking differently: Take 5 mg by mouth See admin instructions. Take one tablet (5 mg) by mouth every morning, double up dose during sick day rule) 100 tablet 3   rivaroxaban (XARELTO) 20 MG TABS tablet Take 1 tablet (20 mg total) by mouth daily with supper. 30 tablet 11   vitamin B-12 (CYANOCOBALAMIN) 1000 MCG tablet Take 1,000 mcg by mouth daily with lunch.      Allergies as of 12/13/2020 - Review Complete 12/13/2020  Allergen Reaction Noted   Propofol Other (See Comments) 02/06/2007   Flecainide Other (See Comments) 06/19/2020   Amiodarone Other (See Comments) 09/03/2015   Amlodipine Other (See Comments) 09/01/2016    Family History  Problem Relation Age of Onset   Heart disease Mother    Hypertension Mother    Rheum arthritis Mother    Osteoarthritis Mother    Heart disease Father    Hypertension Father    Osteoarthritis Father    Heart disease Sister    Diabetes Brother    Cancer Brother     Social History   Socioeconomic History   Marital status: Legally Separated    Spouse name: Not on file   Number of children: Not on file   Years of education: Not on file   Highest education level: Not on file  Occupational History   Not on file  Tobacco Use   Smoking status: Never   Smokeless tobacco: Never  Vaping Use   Vaping Use: Never used  Substance and Sexual Activity   Alcohol use: Never   Drug use: Never    Sexual activity: Not on file  Other Topics Concern   Not on file  Social History Narrative   Not on file   Social Determinants of Health   Financial Resource Strain: Not on file  Food Insecurity: Not on file  Transportation Needs: Not on file  Physical Activity: Not on file  Stress: Not on file  Social Connections: Not on file  Intimate Partner Violence: Not on file    Review of Systems: as per HPI Pertinent positives: unintentional weight loss, arthritis in her knees/back, dysphagia   Physical Exam: Vital signs in last 24 hours: Temp:  [98 F (36.7 C)-98.6 F (37 C)] 98.1 F (36.7 C) (11/20 0354) Pulse Rate:  [69-75] 70 (11/20 0900) Resp:  [13-27] 18 (11/20 0900) BP: (67-138)/(39-88) 105/57 (11/20 0900) SpO2:  [98 %-100 %] 100 % (11/20 0900) Weight:  [53.5 kg] 53.5 kg (11/19 2218)    General:   Frail, elderly, on RA Head:  Normocephalic and atraumatic. Eyes: Prominent pallor Ears:  Normal auditory acuity. Nose:  No deformity, discharge,  or lesions. Mouth:  No deformity or lesions.  Oropharynx pink & moist. Neck:  Supple; no masses or thyromegaly. Lungs:  Clear throughout to auscultation.   No wheezes, crackles, or rhonchi. No acute distress. Heart:  Regular rate and rhythm; no murmurs, clicks, rubs,  or gallops. Extremities:  Without clubbing or edema. Neurologic:  Alert and  oriented x4;  grossly normal neurologically. Skin:  Intact without significant lesions or rashes. Psych:  Alert and cooperative. Normal mood and affect. Abdomen:  Soft, nontender and nondistended. No masses, hepatosplenomegaly or hernias noted. Normal bowel sounds, without guarding, and without rebound.         Lab Results: Recent Labs    12/13/20 1846 12/13/20 1928 12/14/20 0535 12/14/20 0735 12/14/20 0932  WBC 5.5  --   --   --   --   HGB 6.0*   < > 8.7* 8.9* 9.5*  HCT 19.2*   < > 26.3* 26.3* 29.6*  PLT 91*  --   --   --   --    < > = values in this interval not displayed.    BMET Recent Labs    12/13/20 1846 12/13/20 1928  NA 140 145  K 2.8* 2.9*  CL 118* 116*  CO2 17*  --   GLUCOSE 92 87  BUN 15 15  CREATININE 0.75 0.70  CALCIUM 6.2*  --    LFT Recent Labs    12/13/20 1846  PROT 4.4*  ALBUMIN 1.8*  AST 26  ALT 17  ALKPHOS 27*  BILITOT 0.6   PT/INR No results for input(s): LABPROT, INR in the last 72 hours.  Studies/Results: DG Chest Portable 1 View  Result Date: 12/13/2020 CLINICAL DATA:  Shortness of breath EXAM: PORTABLE CHEST 1 VIEW COMPARISON:  05/12/2020 FINDINGS: Cardiac shadow is enlarged. Postsurgical changes and defibrillator are again seen and stable. Mild vascular congestion is again noted. No focal infiltrate or effusion is seen. No bony abnormality is noted. IMPRESSION: Mild vascular congestion stable from the prior exam. Electronically Signed   By: Inez Catalina M.D.   On: 12/13/2020 19:54   CT ANGIO GI BLEED  Result Date: 12/13/2020 CLINICAL DATA:  Bright red blood per rectum, hypertension EXAM: CTA ABDOMEN AND PELVIS WITHOUT AND WITH CONTRAST TECHNIQUE: Multidetector CT imaging of the abdomen and pelvis was performed using the standard protocol during bolus administration of intravenous contrast. Multiplanar reconstructed images and MIPs were obtained and reviewed to evaluate the vascular anatomy. CONTRAST:  158mL OMNIPAQUE IOHEXOL 350 MG/ML SOLN COMPARISON:  06/30/2020 FINDINGS: VASCULAR Aorta: Minimal atherosclerotic calcification. Normal caliber; no aneurysm or dissection. No periaortic inflammatory change. Celiac: Patent without evidence of aneurysm, dissection,  vasculitis or significant stenosis. SMA: Patent without evidence of aneurysm, dissection, vasculitis or significant stenosis. Renals: Single right and dual left renal arteries are widely patent demonstrate normal vascular morphology. No aneurysm or dissection. IMA: Patent without evidence of aneurysm, dissection, vasculitis or significant stenosis. Inflow: Patent  without evidence of aneurysm, dissection, vasculitis or significant stenosis. Proximal Outflow: Bilateral common femoral and visualized portions of the superficial and profunda femoral arteries are patent without evidence of aneurysm, dissection, vasculitis or significant stenosis. Veins: Unremarkable Review of the MIP images confirms the above findings. NON-VASCULAR Lower chest: Ground-glass pulmonary infiltrate with associated intra and interlobular septal thickening and traction bronchiectasis in keeping with pulmonary fibrosis again noted at the visualized lung bases, similar to prior examination. Tricuspid, mitral, and possible aortic valve replacement has been performed. Pacemaker leads are seen within the right atrium and right ventricle. Moderate cardiomegaly with biatrial enlargement. Reflux of contrast into the hepatic venous system is in keeping with at least some degree of right heart failure or tricuspid regurgitation. Hepatobiliary: Cholelithiasis without pericholecystic inflammatory change. Gallbladder unremarkable. No intra or extrahepatic biliary ductal dilation. Pancreas: Unremarkable Spleen: Unremarkable Adrenals/Urinary Tract: Adrenal glands are unremarkable. Kidneys are normal, without renal calculi, focal lesion, or hydronephrosis. Bladder is unremarkable. Stomach/Bowel: There is active gastrointestinal hemorrhage involving the mid descending colon, best seen on axial image # 62/6 and coronal image # 96/11. The underlying etiology of the hemorrhage is not clearly identified, however, this most likely represents a small diverticular hemorrhage. There is background moderate sigmoid diverticulosis. The stomach, small bowel, and large bowel are otherwise unremarkable. The appendix is not clearly identified and is likely absent. Lymphatic: No pathologic adenopathy within the abdomen and pelvis. Reproductive: Uterus and bilateral adnexa are unremarkable. Other: No abdominal wall hernia.  The rectum  is unremarkable. Musculoskeletal: There is grade 2 anterolisthesis of L4-5 with superimposed advanced degenerative changes. Resultant marked central canal stenosis best seen on axial image # 70/6. Superior endplate fracture of L3 with approximately 10-20% loss of height noted. Osseous structures are diffusely osteopenic. No acute bone abnormality identified. No lytic or blastic bone lesions. IMPRESSION: VASCULAR No acute findings. NON-VASCULAR Active gastrointestinal hemorrhage involving the mid descending colon likely representing an active diverticular hemorrhage. Stable bibasilar pulmonary fibrotic change. Moderate cardiomegaly with biatrial enlargement and at least some degree of right heart failure or tricuspid regurgitation. Cholelithiasis. Moderate sigmoid diverticulosis. Grade 2 anterolisthesis L4-5 with superimposed advanced degenerative change. Resultant marked central canal stenosis. These results were called by telephone at the time of interpretation on 12/13/2020 at 9:52 pm to provider [facility] , who verbally acknowledged these results. Electronically Signed   By: Helyn Numbers M.D.   On: 12/13/2020 21:54    Impression: Hematochezia-active gastrointestinal hemorrhage involving mid descending colon likely representing active diverticular hemorrhage-evaluated by Dr. Loreta Ave from IR, recommended mesenteric angiogram and possible embolization after reversal of Xarelto She was given Kcentra x1 dose on 12/13/2020 Moderate sigmoid diverticulosis  Hemoglobin has improved to 9.5 after 2 unit PRBC transfusion Atrial fibrillation on Xarelto, last dose 12/13/2020  Hypokalemia, potassium 2.9 Acidosis, bicarb 17 Hypocalcemia, calcium 6.2 Hypomagnesemia, magnesium 1.6 Hypoalbuminemia, albumin 1.8   Plan: Monitor H and H and transfuse as needed If rectal bleeding continues, recommend IR evaluation for embolization    LOS: 1 day   Kerin Salen, MD  12/14/2020, 11:32 AM

## 2020-12-14 NOTE — Progress Notes (Addendum)
HOSPITAL MEDICINE OVERNIGHT EVENT NOTE    Repeat H/H reviewed.  Hgb currently at 9.1, quite a large increase compared to 5.1 after 2 unit PRBC transfusion.    Patient remains hemodynamically stable.  Nursing reports the patient is continuing to put out a mixture of dark and bright red stool output, approximately half a pint identified on last nursing assessment.  At this point, patient states that she feels much better, patient is AAOx3.  Continue to monitor closely, continuing to follow serial CBCs.  Per my discussion with the admitting provider, IR is actively following however I will contact them again if patient becomes hypotensive continues to exhibit significant ongoing bleeding or exhibits a substantial drop in hemoglobin and hematocrit.    Marinda Elk  MD [facility]   ADDENDUM 6:45AM  Repeat Hgb 8.7.  Patient remains hemodynamically stable.  Continue to monitor.  Deno Lunger Isley Zinni

## 2020-12-14 NOTE — Consult Note (Signed)
[facility] Consult  Referring Provider: ER Primary Care Physician:  Scheryl Marten, [name] Primary Gastroenterologist: Dr.Kimbree Casanas  Reason for Consultation: Rectal bleeding  HPI: Lindsey Huber is a 75 y.o. female presented to the ER with worsening shortness of breath, chest pain, fatigue, diffuse abdominal pain, diarrhea, rectal bleeding since 1 day. In ER found to be hypotensive, blood pressure 60/30 mmHg, hemoglobin 6, dropped to 5.1(baseline 10.5) CT angio performed in ER showed active bleeding from mid descending colon, likely active diverticular bleeding  Past medical history:chronic diastolic CHF with valvular disease, status post prosthetic aortic and mitral valve replacement, bioprosthetic tricuspid valve replacement, permanent atrial fibrillation, sick sinus syndrome, status post AICD placement, history of thromboembolism, lupus, pulmonary hypertension and interstitial lung disease. Recent cardiac cath on 11/27/2020: EF 60-65, mild PAH  Prior GI work-up: Barium swallow 06/19/2020, possibility of achalasia, EGD with Botox recommended however not deemed appropriate for anesthesia. Past Medical History:  Diagnosis Date   A-fib ([place])    Anemia    Cardiomyopathy ([place])    CHF (congestive heart failure) (HCC)    CKD (chronic kidney disease)    Diverticulosis    Hypertension    Lupus (HCC)    Osteoarthritis    Pulmonary fibrosis ([place])    Vitamin D deficiency     Past Surgical History:  Procedure Laterality Date   CARDIAC VALVE SURGERY     CARPAL TUNNEL RELEASE     HEMORRHOID SURGERY     PACEMAKER INSERTION     RIGHT/LEFT HEART CATH AND CORONARY ANGIOGRAPHY N/A 11/27/2020   Procedure: RIGHT/LEFT HEART CATH AND CORONARY ANGIOGRAPHY;  Surgeon: Jolaine Artist, MD;  Location: [facility];  Service: Cardiovascular;  Laterality: N/A;   TONSILLECTOMY      Prior to Admission medications   Medication Sig Start Date End Date Taking? Authorizing Provider   acetaminophen (TYLENOL) 500 MG tablet Take 1,000 mg by mouth every 6 (six) hours as needed (pain).   Yes [provider]  albuterol (VENTOLIN HFA) 108 (90 Base) MCG/ACT inhaler Inhale 2 puffs into the lungs every 6 (six) hours as needed for wheezing or shortness of breath.   Yes [provider]  allopurinol (ZYLOPRIM) 100 MG tablet TAKE 1 TABLET BY MOUTH EVERY DAY Patient taking differently: Take 100 mg by mouth every morning. 11/06/20  Yes Rice, Resa Miner, MD  amoxicillin (AMOXIL) 500 MG capsule Take 2,000 mg by mouth See admin instructions. Take 4 capsules (2000 mg) by mouth one hour prior to dental procedures 03/30/20  Yes [provider]  Budeson-Glycopyrrol-Formoterol (BREZTRI AEROSPHERE) 160-9-4.8 MCG/ACT AERO Inhale 2 puffs into the lungs 2 (two) times daily as needed (shortness of breath).   Yes [provider]  Calcium Carbonate-Vitamin D (CALTRATE 600+D PO) Take 1 capsule by mouth daily with lunch.   Yes [provider]  cetirizine HCl (ZYRTEC) 5 MG/5ML SOLN Take 5 mg by mouth at bedtime.   Yes [provider]  Cholecalciferol 25 MCG (1000 UT) tablet Take 1,000 Units by mouth daily with lunch.   Yes [provider]  ferrous sulfate 325 (65 FE) MG tablet Take 325 mg by mouth daily with lunch.   Yes [provider]  fluticasone (FLONASE) 50 MCG/ACT nasal spray Place 2 sprays into both nostrils daily as needed for allergies.   Yes [provider]  folic acid (FOLVITE) Q000111Q MCG tablet Take 800 mcg by mouth daily with lunch.   Yes [provider]  furosemide (LASIX) 20 MG tablet  Take 20 mg by mouth See admin instructions. Take one tablet (20 mg) by mouth every morning, Hold if BP is lower than 110/60   Yes [provider]  hydroxychloroquine (PLAQUENIL) 200 MG tablet Take 1 tablet (200 mg total) by mouth daily. Patient taking differently: Take 200 mg by mouth every morning. 08/20/20  Yes Rice,  Resa Miner, MD  levothyroxine (SYNTHROID) 25 MCG tablet Take 1 tablet (25 mcg total) by mouth daily. Patient taking differently: Take 25 mcg by mouth daily before breakfast. 08/22/20  Yes Shamleffer, Melanie Crazier, MD  metoprolol succinate (TOPROL-XL) 25 MG 24 hr tablet Take 25 mg by mouth See admin instructions. Take one tablet (25 mg) by mouth every morning, Hold dose if <110 SBP or <60 DBP 06/18/20  Yes [provider]  Multiple Vitamins-Minerals (MULTIVITAMIN WITH MINERALS) tablet Take 1 tablet by mouth daily with lunch.   Yes [provider]  Nintedanib (OFEV) 150 MG CAPS Take 150 mg by mouth at bedtime.   Yes [provider]  polyvinyl alcohol (LIQUIFILM TEARS) 1.4 % ophthalmic solution Place 1 drop into both eyes daily as needed for dry eyes.   Yes [provider]  predniSONE (DELTASONE) 5 MG tablet Take 1 tablet (5 mg total) by mouth daily with breakfast. Patient to double up dose during sick day rule Patient taking differently: Take 5 mg by mouth See admin instructions. Take one tablet (5 mg) by mouth every morning, double up dose during sick day rule 08/20/20  Yes Shamleffer, Melanie Crazier, MD  rivaroxaban (XARELTO) 20 MG TABS tablet Take 1 tablet (20 mg total) by mouth daily with supper. 07/17/20  Yes Vickie Epley, MD  vitamin B-12 (CYANOCOBALAMIN) 1000 MCG tablet Take 1,000 mcg by mouth daily with lunch.   Yes [provider]    Current Facility-Administered Medications  Medication Dose Route Frequency Provider Last Rate Last Admin   fluticasone furoate-vilanterol (BREO ELLIPTA) 100-25 MCG/ACT 1 puff  1 puff Inhalation Daily PRN Tu, Ching T, DO       And   umeclidinium bromide (INCRUSE ELLIPTA) 62.5 MCG/ACT 1 puff  1 puff Inhalation Daily PRN Tu, Ching T, DO       methylPREDNISolone sodium succinate (SOLU-MEDROL) 40 mg/mL injection 20 mg  20 mg Intravenous Daily Tu, Ching T, DO   20 mg at 12/14/20 [id number]   Nintedanib (OFEV) CAPS 150  mg  150 mg Oral QHS Tu, Ching T, DO       Current Outpatient Medications  Medication Sig Dispense Refill   acetaminophen (TYLENOL) 500 MG tablet Take 1,000 mg by mouth every 6 (six) hours as needed (pain).     albuterol (VENTOLIN HFA) 108 (90 Base) MCG/ACT inhaler Inhale 2 puffs into the lungs every 6 (six) hours as needed for wheezing or shortness of breath.     allopurinol (ZYLOPRIM) 100 MG tablet TAKE 1 TABLET BY MOUTH EVERY DAY (Patient taking differently: Take 100 mg by mouth every morning.) 90 tablet 0   amoxicillin (AMOXIL) 500 MG capsule Take 2,000 mg by mouth See admin instructions. Take 4 capsules (2000 mg) by mouth one hour prior to dental procedures     Budeson-Glycopyrrol-Formoterol (BREZTRI AEROSPHERE) 160-9-4.8 MCG/ACT AERO Inhale 2 puffs into the lungs 2 (two) times daily as needed (shortness of breath).     Calcium Carbonate-Vitamin D (CALTRATE 600+D PO) Take 1 capsule by mouth daily with lunch.     cetirizine HCl (ZYRTEC) 5 MG/5ML SOLN Take 5 mg by mouth at bedtime.  Cholecalciferol 25 MCG (1000 UT) tablet Take 1,000 Units by mouth daily with lunch.     ferrous sulfate 325 (65 FE) MG tablet Take 325 mg by mouth daily with lunch.     fluticasone (FLONASE) 50 MCG/ACT nasal spray Place 2 sprays into both nostrils daily as needed for allergies.     folic acid (FOLVITE) Q000111Q MCG tablet Take 800 mcg by mouth daily with lunch.     furosemide (LASIX) 20 MG tablet Take 20 mg by mouth See admin instructions. Take one tablet (20 mg) by mouth every morning, Hold if BP is lower than 110/60     hydroxychloroquine (PLAQUENIL) 200 MG tablet Take 1 tablet (200 mg total) by mouth daily. (Patient taking differently: Take 200 mg by mouth every morning.) 90 tablet 0   levothyroxine (SYNTHROID) 25 MCG tablet Take 1 tablet (25 mcg total) by mouth daily. (Patient taking differently: Take 25 mcg by mouth daily before breakfast.) 90 tablet 3   metoprolol succinate (TOPROL-XL) 25 MG 24 hr tablet Take 25  mg by mouth See admin instructions. Take one tablet (25 mg) by mouth every morning, Hold dose if <110 SBP or <60 DBP     Multiple Vitamins-Minerals (MULTIVITAMIN WITH MINERALS) tablet Take 1 tablet by mouth daily with lunch.     Nintedanib (OFEV) 150 MG CAPS Take 150 mg by mouth at bedtime.     polyvinyl alcohol (LIQUIFILM TEARS) 1.4 % ophthalmic solution Place 1 drop into both eyes daily as needed for dry eyes.     predniSONE (DELTASONE) 5 MG tablet Take 1 tablet (5 mg total) by mouth daily with breakfast. Patient to double up dose during sick day rule (Patient taking differently: Take 5 mg by mouth See admin instructions. Take one tablet (5 mg) by mouth every morning, double up dose during sick day rule) 100 tablet 3   rivaroxaban (XARELTO) 20 MG TABS tablet Take 1 tablet (20 mg total) by mouth daily with supper. 30 tablet 11   vitamin B-12 (CYANOCOBALAMIN) 1000 MCG tablet Take 1,000 mcg by mouth daily with lunch.      Allergies as of 12/13/2020 - Review Complete 12/13/2020  Allergen Reaction Noted   Propofol Other (See Comments) 02/06/2007   Flecainide Other (See Comments) 06/19/2020   Amiodarone Other (See Comments) 09/03/2015   Amlodipine Other (See Comments) 09/01/2016    Family History  Problem Relation Age of Onset   Heart disease Mother    Hypertension Mother    Rheum arthritis Mother    Osteoarthritis Mother    Heart disease Father    Hypertension Father    Osteoarthritis Father    Heart disease Sister    Diabetes Brother    Cancer Brother     Social History   Socioeconomic History   Marital status: Legally Separated    Spouse name: Not on file   Number of children: Not on file   Years of education: Not on file   Highest education level: Not on file  Occupational History   Not on file  Tobacco Use   Smoking status: Never   Smokeless tobacco: Never  Vaping Use   Vaping Use: Never used  Substance and Sexual Activity   Alcohol use: Never   Drug use: Never    Sexual activity: Not on file  Other Topics Concern   Not on file  Social History Narrative   Not on file   Social Determinants of Health   Financial Resource Strain: Not on file  Food Insecurity: Not on file  Transportation Needs: Not on file  Physical Activity: Not on file  Stress: Not on file  Social Connections: Not on file  Intimate Partner Violence: Not on file    Review of Systems: as per HPI Pertinent positives: unintentional weight loss, arthritis in her knees/back, dysphagia   Physical Exam: Vital signs in last 24 hours: Temp:  [98 F (36.7 C)-98.6 F (37 C)] 98.1 F (36.7 C) (11/20 0354) Pulse Rate:  [69-75] 70 (11/20 0900) Resp:  [13-27] 18 (11/20 0900) BP: (67-138)/(39-88) 105/57 (11/20 0900) SpO2:  [98 %-100 %] 100 % (11/20 0900) Weight:  [53.5 kg] 53.5 kg (11/19 2218)    General:   Frail, elderly, on RA Head:  Normocephalic and atraumatic. Eyes: Prominent pallor Ears:  Normal auditory acuity. Nose:  No deformity, discharge,  or lesions. Mouth:  No deformity or lesions.  Oropharynx pink & moist. Neck:  Supple; no masses or thyromegaly. Lungs:  Clear throughout to auscultation.   No wheezes, crackles, or rhonchi. No acute distress. Heart:  Regular rate and rhythm; no murmurs, clicks, rubs,  or gallops. Extremities:  Without clubbing or edema. Neurologic:  Alert and  oriented x4;  grossly normal neurologically. Skin:  Intact without significant lesions or rashes. Psych:  Alert and cooperative. Normal mood and affect. Abdomen:  Soft, nontender and nondistended. No masses, hepatosplenomegaly or hernias noted. Normal bowel sounds, without guarding, and without rebound.         Lab Results: Recent Labs    12/13/20 1846 12/13/20 1928 12/14/20 0535 12/14/20 0735 12/14/20 0932  WBC 5.5  --   --   --   --   HGB 6.0*   < > 8.7* 8.9* 9.5*  HCT 19.2*   < > 26.3* 26.3* 29.6*  PLT 91*  --   --   --   --    < > = values in this interval not displayed.    BMET Recent Labs    12/13/20 1846 12/13/20 1928  NA 140 145  K 2.8* 2.9*  CL 118* 116*  CO2 17*  --   GLUCOSE 92 87  BUN 15 15  CREATININE 0.75 0.70  CALCIUM 6.2*  --    LFT Recent Labs    12/13/20 1846  PROT 4.4*  ALBUMIN 1.8*  AST 26  ALT 17  ALKPHOS 27*  BILITOT 0.6   PT/INR No results for input(s): LABPROT, INR in the last 72 hours.  Studies/Results: DG Chest Portable 1 View  Result Date: 12/13/2020 CLINICAL DATA:  Shortness of breath EXAM: PORTABLE CHEST 1 VIEW COMPARISON:  05/12/2020 FINDINGS: Cardiac shadow is enlarged. Postsurgical changes and defibrillator are again seen and stable. Mild vascular congestion is again noted. No focal infiltrate or effusion is seen. No bony abnormality is noted. IMPRESSION: Mild vascular congestion stable from the prior exam. Electronically Signed   By: Inez Catalina M.D.   On: 12/13/2020 19:54   CT ANGIO GI BLEED  Result Date: 12/13/2020 CLINICAL DATA:  Bright red blood per rectum, hypertension EXAM: CTA ABDOMEN AND PELVIS WITHOUT AND WITH CONTRAST TECHNIQUE: Multidetector CT imaging of the abdomen and pelvis was performed using the standard protocol during bolus administration of intravenous contrast. Multiplanar reconstructed images and MIPs were obtained and reviewed to evaluate the vascular anatomy. CONTRAST:  149mL OMNIPAQUE IOHEXOL 350 MG/ML SOLN COMPARISON:  06/30/2020 FINDINGS: VASCULAR Aorta: Minimal atherosclerotic calcification. Normal caliber; no aneurysm or dissection. No periaortic inflammatory change. Celiac: Patent without evidence of aneurysm, dissection,  vasculitis or significant stenosis. SMA: Patent without evidence of aneurysm, dissection, vasculitis or significant stenosis. Renals: Single right and dual left renal arteries are widely patent demonstrate normal vascular morphology. No aneurysm or dissection. IMA: Patent without evidence of aneurysm, dissection, vasculitis or significant stenosis. Inflow: Patent  without evidence of aneurysm, dissection, vasculitis or significant stenosis. Proximal Outflow: Bilateral common femoral and visualized portions of the superficial and profunda femoral arteries are patent without evidence of aneurysm, dissection, vasculitis or significant stenosis. Veins: Unremarkable Review of the MIP images confirms the above findings. NON-VASCULAR Lower chest: Ground-glass pulmonary infiltrate with associated intra and interlobular septal thickening and traction bronchiectasis in keeping with pulmonary fibrosis again noted at the visualized lung bases, similar to prior examination. Tricuspid, mitral, and possible aortic valve replacement has been performed. Pacemaker leads are seen within the right atrium and right ventricle. Moderate cardiomegaly with biatrial enlargement. Reflux of contrast into the hepatic venous system is in keeping with at least some degree of right heart failure or tricuspid regurgitation. Hepatobiliary: Cholelithiasis without pericholecystic inflammatory change. Gallbladder unremarkable. No intra or extrahepatic biliary ductal dilation. Pancreas: Unremarkable Spleen: Unremarkable Adrenals/Urinary Tract: Adrenal glands are unremarkable. Kidneys are normal, without renal calculi, focal lesion, or hydronephrosis. Bladder is unremarkable. Stomach/Bowel: There is active gastrointestinal hemorrhage involving the mid descending colon, best seen on axial image # 62/6 and coronal image # 96/11. The underlying etiology of the hemorrhage is not clearly identified, however, this most likely represents a small diverticular hemorrhage. There is background moderate sigmoid diverticulosis. The stomach, small bowel, and large bowel are otherwise unremarkable. The appendix is not clearly identified and is likely absent. Lymphatic: No pathologic adenopathy within the abdomen and pelvis. Reproductive: Uterus and bilateral adnexa are unremarkable. Other: No abdominal wall hernia.  The rectum  is unremarkable. Musculoskeletal: There is grade 2 anterolisthesis of L4-5 with superimposed advanced degenerative changes. Resultant marked central canal stenosis best seen on axial image # 70/6. Superior endplate fracture of L3 with approximately 10-20% loss of height noted. Osseous structures are diffusely osteopenic. No acute bone abnormality identified. No lytic or blastic bone lesions. IMPRESSION: VASCULAR No acute findings. NON-VASCULAR Active gastrointestinal hemorrhage involving the mid descending colon likely representing an active diverticular hemorrhage. Stable bibasilar pulmonary fibrotic change. Moderate cardiomegaly with biatrial enlargement and at least some degree of right heart failure or tricuspid regurgitation. Cholelithiasis. Moderate sigmoid diverticulosis. Grade 2 anterolisthesis L4-5 with superimposed advanced degenerative change. Resultant marked central canal stenosis. These results were called by telephone at the time of interpretation on 12/13/2020 at 9:52 pm to provider [facility] , who verbally acknowledged these results. Electronically Signed   By: Helyn Numbers M.D.   On: 12/13/2020 21:54    Impression: Hematochezia-active gastrointestinal hemorrhage involving mid descending colon likely representing active diverticular hemorrhage-evaluated by Dr. Loreta Ave from IR, recommended mesenteric angiogram and possible embolization after reversal of Xarelto She was given Kcentra x1 dose on 12/13/2020 Moderate sigmoid diverticulosis  Hemoglobin has improved to 9.5 after 2 unit PRBC transfusion Atrial fibrillation on Xarelto, last dose 12/13/2020  Hypokalemia, potassium 2.9 Acidosis, bicarb 17 Hypocalcemia, calcium 6.2 Hypomagnesemia, magnesium 1.6 Hypoalbuminemia, albumin 1.8   Plan: Monitor H and H and transfuse as needed If rectal bleeding continues, recommend IR evaluation for embolization    LOS: 1 day   Kerin Salen, MD  12/14/2020, 11:32 AM

## 2020-12-15 DIAGNOSIS — K922 Gastrointestinal hemorrhage, unspecified: Secondary | ICD-10-CM | POA: Diagnosis not present

## 2020-12-15 DIAGNOSIS — I5032 Chronic diastolic (congestive) heart failure: Secondary | ICD-10-CM | POA: Diagnosis not present

## 2020-12-15 DIAGNOSIS — E876 Hypokalemia: Secondary | ICD-10-CM | POA: Diagnosis not present

## 2020-12-15 LAB — BASIC METABOLIC PANEL
Anion gap: 6 (ref 5–15)
BUN: 18 mg/dL (ref 8–23)
CO2: 21 mmol/L — ABNORMAL LOW (ref 22–32)
Calcium: 8 mg/dL — ABNORMAL LOW (ref 8.9–10.3)
Chloride: 107 mmol/L (ref 98–111)
Creatinine, Ser: 0.78 mg/dL (ref 0.44–1.00)
GFR, Estimated: >60 mL/min (ref >60)
Glucose, Bld: 83 mg/dL (ref 70–99)
Potassium: 4.1 mmol/L (ref 3.5–5.1)
Sodium: 134 mmol/L — ABNORMAL LOW (ref 135–145)

## 2020-12-15 LAB — CBC
HCT: 25.5 % — ABNORMAL LOW (ref 36.0–46.0)
Hemoglobin: 8.6 g/dL — ABNORMAL LOW (ref 12.0–15.0)
MCH: 31.7 pg (ref 26.0–34.0)
MCHC: 33.7 g/dL (ref 30.0–36.0)
MCV: 94.1 fL (ref 80.0–100.0)
Platelets: 100 10*3/uL — ABNORMAL LOW (ref 150–400)
RBC: 2.71 MIL/uL — ABNORMAL LOW (ref 3.87–5.11)
RDW: 15.2 % (ref 11.5–15.5)
WBC: 6.7 10*3/uL (ref 4.0–10.5)
nRBC: 0 % (ref 0.0–0.2)

## 2020-12-15 LAB — HEMOGLOBIN AND HEMATOCRIT, BLOOD
HCT: 29.2 % — ABNORMAL LOW (ref 36.0–46.0)
Hemoglobin: 9.7 g/dL — ABNORMAL LOW (ref 12.0–15.0)

## 2020-12-15 MED ORDER — NINTEDANIB ESYLATE 150 MG PO CAPS
150.0000 mg | ORAL_CAPSULE | Freq: Every day | ORAL | Status: DC
  Administered 2020-12-16 – 2020-12-22 (×8): 150 mg via ORAL
  Filled 2020-12-15 (×8): qty 1

## 2020-12-15 MED ORDER — METOPROLOL SUCCINATE ER 25 MG PO TB24
25.0000 mg | ORAL_TABLET | Freq: Every day | ORAL | Status: DC
  Administered 2020-12-16 – 2020-12-22 (×6): 25 mg via ORAL
  Filled 2020-12-15 (×9): qty 1

## 2020-12-15 NOTE — Progress Notes (Signed)
PROGRESS NOTE    Lindsey Huber  [id number] DOB: Jan 26, 1946 DOA: 12/13/2020 PCP: Scheryl Marten, PA    Brief Narrative:  Lindsey Huber was admitted to the hospital with the working diagnosis of acute blood loss anemia due to lower GI bleed.    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   Reported intermittent chest pain for 2 days leading into her hospitalization, associated with dyspnea, fatigue and diffuse abdominal pain.  Positive melena.  Because of persistent symptoms she came to the hospital, for further evaluation.  On her initial physical examination her blood pressure was 60/30, she received IV fluids and improved to 122/88, heart rate 75, respiratory rate 16, temperature 98.6, oxygen saturation 100%, her lungs are clear to auscultation bilaterally, heart S1-S2, present, rhythmic, her abdomen was diffusely tender to palpation with no guarding or rebound, no lower extremity edema.   Sodium 140, potassium 2.8, chloride 118, bicarb 17, glucose 92, BUN 15, creatinine 0.72, calcium 6.2, albumin 1.8, AST 26, ALT 17, white count 5.5, hemoglobin 6.0, hematocrit 19.2, platelets 91 SARS COVID-19 negative   Chest radiograph with cardiomegaly, no infiltrates.   EKG 84 bpm, rightward axis, right bundle branch block, PACs with ventricular paced rhythm, no significant ST segment or T wave changes.   CT angiography show acute gastrointestinal hemorrhage involving the mid descending colon likely representing an active diverticular hemorrhage.  Stable bibasilar pulmonary fibrotic changes.  Moderate cardiomegaly.  Positive cholelithiasis.  Moderate sigmoid  diverticulosis.   Patient received 2 units packed red blood cell transfusion. Anticoagulation was held and received Kcentra for reversal. IR was consulted with recommendations to reverse anticoagulation, and close follow-up, with plan of angiogram and possible embolization if worsening bleeding.    Her Hgb has been stable after PRBC transfusion but she continue to have hematochezia.   Assessment & Plan:   Principal Problem:   Acute GI bleeding Active Problems:   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HCC)   Systemic lupus erythematosus (HCC)   Chronic heart failure with preserved ejection fraction (HCC)   Non-rheumatic atrial fibrillation (HCC)   Secondary adrenal insufficiency (HCC)   Hypotension   Hypokalemia   Hypocalcemia   Hypomagnesemia   Acute blood loss anemia due to lower GI bleed, diverticular bleed, symptomatic anemia. Iron deficiency  Sp 2 units PRBC transfusion Sp prothrombin complex infusion to revert anticoagulation.    Follow up hgb this am 8,6 and hct at 25.5 Patient had hematochezia yesterday. Plant to continue with clear liquid diet and follow hgb in am, if worsening bleeding will need IR embolization.  Continue to hold on anticoagulation  On oral iron supplementation.     2. Valvular heart disease, with chronic diastolic heart failure. Bioprosthetic valves   No clinical signs of heart failure decompensation.    3. Paroxysmal atrial fibrillation.  Paced rhythm, continue telemetry monitoring, Continue holding anticoagulation due to acute blood loss anemia.    4. Hyponatremia, hypokalemia, hypomagnesemia and hypocalcemia/ non anion gap metabolic acidosis K has improved to 4,1 Na 134 with serum bicarbonate at 21, renal function with serum cr at 0,78  Continue with clear liquid diet.    5. SLE and ILD  No clinical signs of acute exacerbation.  On Nintedanib and hydroxychloroquine    6.  Secondary adrenal insufficiency.  No signs of acute  decompensation, continue with oral prednisone.   7. Hypothyroid.  On levothyroxine    8. HTN  Blood pressure 124 to 137 mmHg,  Will resume metoprolol to prevent rebound tachycardia.  Patient continue to be at high risk for worsening anemia   Status is: Inpatient  Remains inpatient appropriate because: monitor for bleeding   DVT prophylaxis: Scd   Code Status:    full  Family Communication:  I spoke with patient's daughter at the bedside, we talked in detail about patient's condition, plan of care and prognosis and all questions were addressed.     Consultants:  GI  IR     Subjective: Patient is feeling well, with no nausea or vomiting, no chest pain or dyspnea, no abdominal pain. Positive hematochezia yesterday.   Objective: Vitals:   12/14/20 1401 12/14/20 1402 12/14/20 2000 12/15/20 0421  BP:  (!) 115/51 (!) 142/58 124/76  Pulse:  70 70 69  Resp:  20 18 15   Temp:   98.2 F (36.8 C) (!) 97.5 F (36.4 C)  TempSrc:   Oral Oral  SpO2:  100%  100%  Weight: 53.7 kg     Height: 5\' 2"  (1.575 m)       Intake/Output Summary (Last 24 hours) at 12/15/2020 1229 Last data filed at 12/15/2020 0900 Gross per 24 hour  Intake 120 ml  Output --  Net 120 ml   Filed Weights   12/13/20 2218 12/14/20 1401  Weight: 53.5 kg 53.7 kg    Examination:   General: Not in pain or dyspnea, deconditioned  Neurology: Awake and alert, non focal  E ENT:  mild pallor, no icterus, oral mucosa moist Cardiovascular: No JVD. S1-S2 present, rhythmic, no gallops, rubs, or murmurs. No lower extremity edema. Pulmonary: positive breath sounds bilaterally, adequate air movement, no wheezing, rhonchi or rales. Gastrointestinal. Abdomen soft and non tender Skin. No rashes Musculoskeletal: no joint deformities     Data Reviewed: I have personally reviewed following labs and imaging studies  CBC: Recent Labs  Lab 12/13/20 1846 12/13/20 1928 12/14/20 0735 12/14/20 0932 12/14/20 1210  12/14/20 1432 12/15/20 0330  WBC 5.5  --   --   --   --   --  6.7  NEUTROABS 2.7  --   --   --   --   --   --   HGB 6.0*   < > 8.9* 9.5* 9.1* 9.7* 8.6*  HCT 19.2*   < > 26.3* 29.6* 27.4* 28.7* 25.5*  MCV 104.9*  --   --   --   --   --  94.1  PLT 91*  --   --   --   --   --  100*   < > = values in this interval not displayed.   Basic Metabolic Panel: Recent Labs  Lab 12/13/20 1846 12/13/20 1928 12/13/20 2133 12/14/20 1432 12/15/20 0330  NA 140 145  --  134* 134*  K 2.8* 2.9*  --  4.9 4.1  CL 118* 116*  --  108 107  CO2 17*  --   --  19* 21*  GLUCOSE 92 87  --  198* 83  BUN 15 15  --  19 18  CREATININE 0.75 0.70  --  0.99 0.78  CALCIUM 6.2*  --   --  8.0* 8.0*  MG  --   --  1.6*  --   --    GFR: Estimated Creatinine  Clearance: 49.5 mL/min (by C-G formula based on SCr of 0.78 mg/dL). Liver Function Tests: Recent Labs  Lab 12/13/20 1846  AST 26  ALT 17  ALKPHOS 27*  BILITOT 0.6  PROT 4.4*  ALBUMIN 1.8*   No results for input(s): LIPASE, AMYLASE in the last 168 hours. No results for input(s): AMMONIA in the last 168 hours. Coagulation Profile: No results for input(s): INR, PROTIME in the last 168 hours. Cardiac Enzymes: No results for input(s): CKTOTAL, CKMB, CKMBINDEX, TROPONINI in the last 168 hours. BNP (last 3 results) No results for input(s): PROBNP in the last 8760 hours. HbA1C: No results for input(s): HGBA1C in the last 72 hours. CBG: Recent Labs  Lab 12/13/20 1829  GLUCAP 109*   Lipid Profile: No results for input(s): CHOL, HDL, LDLCALC, TRIG, CHOLHDL, LDLDIRECT in the last 72 hours. Thyroid Function Tests: No results for input(s): TSH, T4TOTAL, FREET4, T3FREE, THYROIDAB in the last 72 hours. Anemia Panel: No results for input(s): VITAMINB12, FOLATE, FERRITIN, TIBC, IRON, RETICCTPCT in the last 72 hours.    Radiology Studies: I have reviewed all of the imaging during this hospital visit personally     Scheduled Meds:  allopurinol  100 mg  Oral q morning   cetirizine HCl  5 mg Oral QHS   ferrous sulfate  325 mg Oral Q lunch   folic acid  1 mg Oral Q lunch   hydroxychloroquine  200 mg Oral q morning   influenza vaccine adjuvanted  0.5 mL Intramuscular Tomorrow-1000   levothyroxine  25 mcg Oral QAC breakfast   multivitamin with minerals  1 tablet Oral Q lunch   Nintedanib  150 mg Oral QHS   predniSONE  5 mg Oral Q breakfast   vitamin B-12  1,000 mcg Oral Q lunch   Continuous Infusions:   LOS: 2 days        Lindsey Huber Annett Gula, MD

## 2020-12-15 NOTE — Progress Notes (Signed)
[facility] Gastroenterology Progress Note  Lindsey Huber 75 y.o. 11-22-1945  CC: Acute lower GI bleed   Subjective: Patient seen and examined at bedside.  Last bleeding episode was last night.  No bowel movement this morning.  Denies abdominal pain  ROS : Afebrile, negative for nausea and vomiting   Objective: Vital signs in last 24 hours: Vitals:   12/14/20 2000 12/15/20 0421  BP: (!) 142/58 124/76  Pulse: 70 69  Resp: 18 15  Temp: 98.2 F (36.8 C) (!) 97.5 F (36.4 C)  SpO2:  100%    Physical Exam:  General : Elderly patient, not in acute distress Abdomen : Soft, nontender, nondistended, bowel sounds present.  No peritoneal signs Psych : Mood and affect normal Neuro : Alert and oriented x3  Lab Results: Recent Labs    12/13/20 2133 12/14/20 1432 12/15/20 0330  NA  --  134* 134*  K  --  4.9 4.1  CL  --  108 107  CO2  --  19* 21*  GLUCOSE  --  198* 83  BUN  --  19 18  CREATININE  --  0.99 0.78  CALCIUM  --  8.0* 8.0*  MG 1.6*  --   --    Recent Labs    12/13/20 1846  AST 26  ALT 17  ALKPHOS 27*  BILITOT 0.6  PROT 4.4*  ALBUMIN 1.8*   Recent Labs    12/13/20 1846 12/13/20 1928 12/14/20 1432 12/15/20 0330  WBC 5.5  --   --  6.7  NEUTROABS 2.7  --   --   --   HGB 6.0*   < > 9.7* 8.6*  HCT 19.2*   < > 28.7* 25.5*  MCV 104.9*  --   --  94.1  PLT 91*  --   --  100*   < > = values in this interval not displayed.   No results for input(s): LABPROT, INR in the last 72 hours.    Assessment/Plan: -Acute lower GI bleed with CT angio showing active bleeding in a mid descending colon probably from diverticular bleed. -Acute blood loss anemia.  Hemoglobin stable now status posttransfusion -History of atrial fibrillation -Xarelto on hold. -History of pulmonary hypertension and interstitial lung disease -S/p prosthetic aortic and mitral valve replacement history of bioprosthetic tricuspid valve  replacement  Recommendation ------------------------- -Continue to monitor H&H.  Transfuse if hemoglobin less than 8 -Continue to hold Xarelto today -If evidence of active bleeding, recommend IR consult for embolization. -If no bleeding in the next 24 hours, may consider resuming anticoagulation with heparin drip if anticoagulation is clinically indicated -Okay to start liquid diet -We will follow  Kathi Der MD, FACP 12/15/2020, 8:13 AM  Contact #  [phone]

## 2020-12-16 ENCOUNTER — Ambulatory Visit: Payer: Medicare HMO | Admitting: Physical Therapy

## 2020-12-16 DIAGNOSIS — K922 Gastrointestinal hemorrhage, unspecified: Secondary | ICD-10-CM | POA: Diagnosis not present

## 2020-12-16 DIAGNOSIS — I5032 Chronic diastolic (congestive) heart failure: Secondary | ICD-10-CM | POA: Diagnosis not present

## 2020-12-16 DIAGNOSIS — E876 Hypokalemia: Secondary | ICD-10-CM | POA: Diagnosis not present

## 2020-12-16 LAB — CBC
HCT: 23.9 % — ABNORMAL LOW (ref 36.0–46.0)
Hemoglobin: 7.9 g/dL — ABNORMAL LOW (ref 12.0–15.0)
MCH: 31.6 pg (ref 26.0–34.0)
MCHC: 33.1 g/dL (ref 30.0–36.0)
MCV: 95.6 fL (ref 80.0–100.0)
Platelets: 96 10*3/uL — ABNORMAL LOW (ref 150–400)
RBC: 2.5 MIL/uL — ABNORMAL LOW (ref 3.87–5.11)
RDW: 14.9 % (ref 11.5–15.5)
WBC: 6.3 10*3/uL (ref 4.0–10.5)
nRBC: 0.3 % — ABNORMAL HIGH (ref 0.0–0.2)

## 2020-12-16 LAB — HEMOGLOBIN AND HEMATOCRIT, BLOOD
HCT: 25.3 % — ABNORMAL LOW (ref 36.0–46.0)
Hemoglobin: 8.5 g/dL — ABNORMAL LOW (ref 12.0–15.0)

## 2020-12-16 LAB — PROTIME-INR
INR: 1.2 (ref 0.8–1.2)
Prothrombin Time: 14.8 seconds (ref 11.4–15.2)

## 2020-12-16 NOTE — Care Management Important Message (Signed)
Important Message  Patient Details  Name: Lindsey Huber MRN: [id number] Date of Birth: [birth year]   Medicare Important Message Given:        Wadie Lessen 12/16/2020, 11:57 AM

## 2020-12-16 NOTE — Progress Notes (Signed)
[facility] Gastroenterology Progress Note  Lindsey Huber 75 y.o. [birth year]  CC: Acute lower GI bleed   Subjective: Patient seen and examined at bedside.  Last bowel movement last night which contained blood clots.  She denies abdominal pain.  ROS : Afebrile, negative for nausea and vomiting   Objective: Vital signs in last 24 hours: Vitals:   12/15/20 2019 12/16/20 0413  BP: 122/64 119/62  Pulse: 87 70  Resp: 18 20  Temp: 98 F (36.7 C) 97.6 F (36.4 C)  SpO2: (!) 81% 100%    Physical Exam:  General : Elderly patient, not in acute distress Abdomen : Soft, nontender, nondistended, bowel sounds present.  No peritoneal signs Psych : Mood and affect normal Neuro : Alert and oriented x3  Lab Results: Recent Labs    12/13/20 2133 12/14/20 1432 12/15/20 0330  NA  --  134* 134*  K  --  4.9 4.1  CL  --  108 107  CO2  --  19* 21*  GLUCOSE  --  198* 83  BUN  --  19 18  CREATININE  --  0.99 0.78  CALCIUM  --  8.0* 8.0*  MG 1.6*  --   --    Recent Labs    12/13/20 1846  AST 26  ALT 17  ALKPHOS 27*  BILITOT 0.6  PROT 4.4*  ALBUMIN 1.8*   Recent Labs    12/13/20 1846 12/13/20 1928 12/15/20 0330 12/15/20 1853 12/16/20 0239  WBC 5.5  --  6.7  --  6.3  NEUTROABS 2.7  --   --   --   --   HGB 6.0*   < > 8.6* 9.7* 7.9*  HCT 19.2*   < > 25.5* 29.2* 23.9*  MCV 104.9*  --  94.1  --  95.6  PLT 91*  --  100*  --  96*   < > = values in this interval not displayed.   No results for input(s): LABPROT, INR in the last 72 hours.    Assessment/Plan: -Acute lower GI bleed with CT angio showing active bleeding in a mid descending colon probably from diverticular bleed. -Acute blood loss anemia.   -History of atrial fibrillation -Xarelto on hold. -History of pulmonary hypertension and interstitial lung disease -S/p prosthetic aortic and mitral valve replacement history of bioprosthetic tricuspid valve replacement  Recommendation ------------------------- -Not having  any further bright red blood per rectum but still passing blood clots. -Continue to monitor H&H.  Transfuse if hemoglobin less than 8 -Continue to hold Xarelto.  May have to hold anticoagulation for additional few days. -If evidence of active rectal bleeding, recommend IR consult for embolization. -Advance diet to soft -We will follow  Kathi Der MD, FACP 12/16/2020, 9:33 AM  Contact #  [phone]

## 2020-12-16 NOTE — Progress Notes (Signed)
PROGRESS NOTE    Lindsey Huber  [id number]:[id number] DOB: 1945/10/10 DOA: 12/13/2020 PCP: Collene Mares, PA    Brief Narrative:  Mrs. Rossy was admitted to the hospital with the working diagnosis of acute blood loss anemia due to lower GI bleed, diverticular bleed.    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   Reported intermittent chest pain for 2 days leading into her hospitalization, associated with dyspnea, fatigue and diffuse abdominal pain.  Positive melena.  Because of persistent symptoms she came to the hospital, for further evaluation.  On her initial physical examination her blood pressure was 60/30, she received IV fluids and improved to 122/88, heart rate 75, respiratory rate 16, temperature 98.6, oxygen saturation 100%, her lungs were clear to auscultation bilaterally, heart S1-S2, present, rhythmic, her abdomen was diffusely tender to palpation with no guarding or rebound, no lower extremity edema.   Sodium 140, potassium 2.8, chloride 118, bicarb 17, glucose 92, BUN 15, creatinine 0.72, calcium 6.2, albumin 1.8, AST 26, ALT 17,  white count 5.5, hemoglobin 6.0, hematocrit 19.2, platelets 91 SARS COVID-19 negative   Chest radiograph with cardiomegaly, no infiltrates.   EKG 84 bpm, rightward axis, right bundle branch block, PACs with ventricular paced rhythm, no significant ST segment or T wave changes.   CT angiography show acute gastrointestinal hemorrhage involving the mid descending colon likely representing an active diverticular hemorrhage.  Stable bibasilar pulmonary fibrotic changes.  Moderate  cardiomegaly.  Positive cholelithiasis.  Moderate sigmoid diverticulosis.   Patient received 2 units packed red blood cell transfusion. Anticoagulation was held and received Kcentra for reversal. IR was consulted with recommendations to reverse anticoagulation, and close follow-up, with plan of angiogram and possible embolization if worsening bleeding.    Hgb and Hct slowly trending down and patient continue to have melanotic/ dark red stools.  11/22 IR re-consulted    Assessment & Plan:   Principal Problem:   Acute GI bleeding Active Problems:   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HCC)   Systemic lupus erythematosus (HCC)   Chronic heart failure with preserved ejection fraction (HCC)   Non-rheumatic atrial fibrillation (HCC)   Secondary adrenal insufficiency (HCC)   Hypotension   Hypokalemia   Hypocalcemia   Hypomagnesemia     Acute blood loss anemia due to lower GI bleed, diverticular bleed, symptomatic anemia. Iron deficiency / thrombocytopenia.  Sp 2 units PRBC transfusion Sp prothrombin complex infusion to revert anticoagulation.    Patient continue to have melanotic and dark blood stools, her hgb has been slowly trending down, today Hgb is 7,9 and Hct 23.9 with Plt 96.   I have contacted IR for re-consultation, if persistent worsening anemia, she likely will need further intervention.    Continue with oral iron supplementation.     2. Valvular heart disease, with chronic diastolic heart failure. Bioprosthetic valves  No signs of hypervolemia or pulmonary edema, continue close blood pressure monitoring.    3. Paroxysmal atrial fibrillation.  Patient has remained on paced rhythm. Continue telemetry monitoring and holding anticoagulation.    4. Hyponatremia, hypokalemia, hypomagnesemia and hypocalcemia/ non anion gap metabolic acidosis Patient has been no clear liquid diet, this am back to NPO due to worsening enemia.  Will follow up renal function and  electrolytes  in am.    5. SLE and ILD  No signs of acute exacerbation.  Continue with Nintedanib and hydroxychloroquine    6. Secondary adrenal insufficiency.  No acute decompensation, continue with oral prednisone at her home dose.    7. Hypothyroid.  Continue with levothyroxine    8. HTN  Blood pressure 159/67 this am with HR 70, continue with metoprolol.   9. Gout. No acute flare, continue with allopurinol.   Patient continue to be at high risk for worsening anemia   Status is: Inpatient  Remains inpatient appropriate because: acute anemia evaluation   DVT prophylaxis: Scd   Code Status:   full  Family Communication:  I spoke over the phone with the patient's daughter about patient's  condition, plan of care, prognosis and all questions were addressed.     Consultants:  GI   Procedures:   IR     Subjective: Patient feeling very weak and deconditioned, continue to have melanotic and dark red stools, positive intermittent mild abdominal cramps.   Objective: Vitals:   12/15/20 1841 12/15/20 2019 12/16/20 0413 12/16/20 1029  BP: (!) 147/48 122/64 119/62 (!) 159/67  Pulse: 73 87 70 70  Resp: 19 18 20 17   Temp: 97.6 F (36.4 C) 98 F (36.7 C) 97.6 F (36.4 C) 97.6 F (36.4 C)  TempSrc: Oral Oral    SpO2:  (!) 81% 100%   Weight:      Height:        Intake/Output Summary (Last 24 hours) at 12/16/2020 1218 Last data filed at 12/16/2020 0900 Gross per 24 hour  Intake 360 ml  Output --  Net 360 ml   Filed Weights   12/13/20 2218 12/14/20 1401  Weight: 53.5 kg 53.7 kg    Examination:   General: Not in pain or dyspnea, deconditioned  Neurology: Awake and alert, non focal  E ENT: positive pallor, no icterus, oral mucosa moist Cardiovascular: No JVD. S1-S2 present, rhythmic, no gallops, positive 3/6 systolic murmurs at the left sternal border. No lower extremity edema. Pulmonary: positive breath sounds bilaterally, with no wheezing, rhonchi or rales. Gastrointestinal.  Abdomen soft and non tender, not distended Skin. No rashes Musculoskeletal: no joint deformities     Data Reviewed: I have personally reviewed following labs and imaging studies  CBC: Recent Labs  Lab 12/13/20 1846 12/13/20 1928 12/14/20 1210 12/14/20 1432 12/15/20 0330 12/15/20 1853 12/16/20 0239  WBC 5.5  --   --   --  6.7  --  6.3  NEUTROABS 2.7  --   --   --   --   --   --   HGB 6.0*   < > 9.1* 9.7* 8.6* 9.7* 7.9*  HCT 19.2*   < > 27.4* 28.7* 25.5* 29.2* 23.9*  MCV 104.9*  --   --   --  94.1  --  95.6  PLT 91*  --   --   --  100*  --  96*   < > = values in this interval not displayed.   Basic Metabolic Panel: Recent Labs  Lab 12/13/20 1846 12/13/20 1928 12/13/20 2133 12/14/20 1432 12/15/20 0330  NA 140 145  --  134* 134*  K 2.8* 2.9*  --  4.9 4.1  CL 118* 116*  --  108 107  CO2 17*  --   --  19* 21*  GLUCOSE 92 87  --  198* 83  BUN 15 15  --  19 18  CREATININE  0.75 0.70  --  0.99 0.78  CALCIUM 6.2*  --   --  8.0* 8.0*  MG  --   --  1.6*  --   --    GFR: Estimated Creatinine Clearance: 49.5 mL/min (by C-G formula based on SCr of 0.78 mg/dL). Liver Function Tests: Recent Labs  Lab 12/13/20 1846  AST 26  ALT 17  ALKPHOS 27*  BILITOT 0.6  PROT 4.4*  ALBUMIN 1.8*   No results for input(s): LIPASE, AMYLASE in the last 168 hours. No results for input(s): AMMONIA in the last 168 hours. Coagulation Profile: Recent Labs  Lab 12/16/20 1130  INR 1.2   Cardiac Enzymes: No results for input(s): CKTOTAL, CKMB, CKMBINDEX, TROPONINI in the last 168 hours. BNP (last 3 results) No results for input(s): PROBNP in the last 8760 hours. HbA1C: No results for input(s): HGBA1C in the last 72 hours. CBG: Recent Labs  Lab 12/13/20 1829  GLUCAP 109*   Lipid Profile: No results for input(s): CHOL, HDL, LDLCALC, TRIG, CHOLHDL, LDLDIRECT in the last 72 hours. Thyroid Function Tests: No results for input(s): TSH, T4TOTAL, FREET4, T3FREE, THYROIDAB in the last 72  hours. Anemia Panel: No results for input(s): VITAMINB12, FOLATE, FERRITIN, TIBC, IRON, RETICCTPCT in the last 72 hours.    Radiology Studies: I have reviewed all of the imaging during this hospital visit personally     Scheduled Meds:  allopurinol  100 mg Oral q morning   cetirizine HCl  5 mg Oral QHS   ferrous sulfate  325 mg Oral Q lunch   folic acid  1 mg Oral Q lunch   hydroxychloroquine  200 mg Oral q morning   influenza vaccine adjuvanted  0.5 mL Intramuscular Tomorrow-1000   levothyroxine  25 mcg Oral QAC breakfast   metoprolol succinate  25 mg Oral Daily   multivitamin with minerals  1 tablet Oral Q lunch   Nintedanib  150 mg Oral QHS   predniSONE  5 mg Oral Q breakfast   vitamin B-12  1,000 mcg Oral Q lunch   Continuous Infusions:   LOS: 3 days        Jamai Dolce Gerome Apley, MD

## 2020-12-16 NOTE — Progress Notes (Signed)
Patient with melanotic stool and blood clots overnight with down trending hgb - IR consulted for possible intervention.  History and imaging reviewed by Dr. Bryn Gulling, Dr. Loreta Ave and Dr. Grace Isaac with plan as below:  - Stat INR - if patient becomes hypotensive and/or tachycardic we will bring to IR for mesenteric angiogram with possible embolization (patient seen and consented for procedure 11/19) - if patient is otherwise hemodynamically stable without BRBPR, continue to monitor hgb and if there is continued down trending recommend NM GI bleeding scan and if positive we will bring to IR for mesenteric angiogram with possible embolization  Above discussed with Dr. Ella Jubilee via Epic secure chat.  Please call IR with questions or concerns.  Lynnette Caffey, PA-C

## 2020-12-17 ENCOUNTER — Inpatient Hospital Stay (HOSPITAL_COMMUNITY): Payer: Medicare HMO

## 2020-12-17 ENCOUNTER — Ambulatory Visit: Payer: Medicare HMO | Admitting: Physical Therapy

## 2020-12-17 DIAGNOSIS — K922 Gastrointestinal hemorrhage, unspecified: Secondary | ICD-10-CM | POA: Diagnosis not present

## 2020-12-17 LAB — CBC
HCT: 24.2 % — ABNORMAL LOW (ref 36.0–46.0)
HCT: 27.2 % — ABNORMAL LOW (ref 36.0–46.0)
Hemoglobin: 8.2 g/dL — ABNORMAL LOW (ref 12.0–15.0)
Hemoglobin: 8.9 g/dL — ABNORMAL LOW (ref 12.0–15.0)
MCH: 32.5 pg (ref 26.0–34.0)
MCH: 32 pg (ref 26.0–34.0)
MCHC: 33.9 g/dL (ref 30.0–36.0)
MCHC: 32.7 g/dL (ref 30.0–36.0)
MCV: 96 fL (ref 80.0–100.0)
MCV: 97.8 fL (ref 80.0–100.0)
Platelets: 98 10*3/uL — ABNORMAL LOW (ref 150–400)
Platelets: 119 10*3/uL — ABNORMAL LOW (ref 150–400)
RBC: 2.52 MIL/uL — ABNORMAL LOW (ref 3.87–5.11)
RBC: 2.78 MIL/uL — ABNORMAL LOW (ref 3.87–5.11)
RDW: 14.7 % (ref 11.5–15.5)
RDW: 14.8 % (ref 11.5–15.5)
WBC: 6.5 10*3/uL (ref 4.0–10.5)
WBC: 10.1 10*3/uL (ref 4.0–10.5)
nRBC: 0 % (ref 0.0–0.2)
nRBC: 0 % (ref 0.0–0.2)

## 2020-12-17 LAB — BASIC METABOLIC PANEL
Anion gap: 4 — ABNORMAL LOW (ref 5–15)
BUN: 9 mg/dL (ref 8–23)
CO2: 26 mmol/L (ref 22–32)
Calcium: 8.5 mg/dL — ABNORMAL LOW (ref 8.9–10.3)
Chloride: 108 mmol/L (ref 98–111)
Creatinine, Ser: 0.81 mg/dL (ref 0.44–1.00)
GFR, Estimated: >60 mL/min (ref >60)
Glucose, Bld: 66 mg/dL — ABNORMAL LOW (ref 70–99)
Potassium: 3.8 mmol/L (ref 3.5–5.1)
Sodium: 138 mmol/L (ref 135–145)

## 2020-12-17 LAB — MAGNESIUM: Magnesium: 1.9 mg/dL (ref 1.7–2.4)

## 2020-12-17 MED ORDER — TECHNETIUM TC 99M-LABELED RED BLOOD CELLS IV KIT
24.6000 | PACK | Freq: Once | INTRAVENOUS | Status: AC | PRN
  Administered 2020-12-17: 24.6 via INTRAVENOUS

## 2020-12-17 NOTE — Progress Notes (Signed)
[facility] Gastroenterology Progress Note  Lindsey Huber 75 y.o. January 26, 1946  CC: Acute lower GI bleed   Subjective: Patient seen and examined at bedside.  Last bowel movement this morning had small amount of blood in the stool.  According to patient, bleeding has slowed down.  Patient wants to eat solid food.  ROS : Afebrile, negative for nausea and vomiting   Objective: Vital signs in last 24 hours: Vitals:   12/17/20 0416 12/17/20 0744  BP: 114/62 123/60  Pulse: 70 69  Resp: 18 16  Temp: 97.8 F (36.6 C) 98.2 F (36.8 C)  SpO2: 100% 100%    Physical Exam:  General : Elderly patient, not in acute distress Abdomen : Soft, nontender, nondistended, bowel sounds present.  No peritoneal signs Psych : Mood and affect normal Neuro : Alert and oriented x3  Lab Results: Recent Labs    12/15/20 0330 12/17/20 0218  NA 134* 138  K 4.1 3.8  CL 107 108  CO2 21* 26  GLUCOSE 83 66*  BUN 18 9  CREATININE 0.78 0.81  CALCIUM 8.0* 8.5*  MG  --  1.9   No results for input(s): AST, ALT, ALKPHOS, BILITOT, PROT, ALBUMIN in the last 72 hours.  Recent Labs    12/16/20 0239 12/16/20 1556 12/17/20 0218  WBC 6.3  --  6.5  HGB 7.9* 8.5* 8.2*  HCT 23.9* 25.3* 24.2*  MCV 95.6  --  96.0  PLT 96*  --  98*   Recent Labs    12/16/20 1130  LABPROT 14.8  INR 1.2      Assessment/Plan: -Acute lower GI bleed with CT angio showing active bleeding in a mid descending colon probably from diverticular bleed. -Acute blood loss anemia.   -History of atrial fibrillation -Xarelto on hold. 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 -Continue to hold Xarelto.  May have to hold anticoagulation for additional few days. -Okay to have soft diet after bleeding  scan from GI standpoint -Monitor H&H.  Transfuse if hemoglobin less than 8.  GI will follow  Kathi Der MD, FACP 12/17/2020, 11:22 AM  Contact #  [phone]

## 2020-12-17 NOTE — Progress Notes (Signed)
PROGRESS NOTE    Lindsey Huber  UYQ:[id number] DOB: 1945-11-02 DOA: 12/13/2020 PCP: Collene Mares, PA    Brief Narrative:  75/F chronically ill with chronic diastolic CHF, valvular heart disease s/p bioprosthetic AVR, mitral valve annuloplasty, bioprosthetic TVR, paroxysmal atrial fibrillation on Eliquis, history of VT status post AICD, history of lupus, interstitial lung disease, pulmonary hypertension -Was admitted with hematochezia and severe acute blood loss anemia, hemoglobin of 6  -CT angiography noted acute gastrointestinal hemorrhage involving the mid descending colon likely representing an active diverticular hemorrhage.   -She was transfused 2 units of PRBC, anticoagulation was held and received Kcentra for reversal -seen by gastroenterology and and interventional radiology in consultation  -IR was reconsulted 11/22 for ongoing bleeding  Assessment & Plan:     Acute diverticular bleeding  Acute blood loss anemia -Transfused 2 units of PRBC this admission -CTA 11/19 noted acute GI hemorrhage involving mid descending colon, likely acute diverticular hge -Gastroenterology following -Bleeding has continued to wax and wane, recurrent bleeding last 48 hours -IR was reconsulted yesterday, nuclear medicine scan recommended with possible mesenteric angiogram/embolization -For NM scan today -Trend hemoglobin  Chronic diastolic CHF Valvular heart disease, s/p bioprosthetic AVR, TVR, mitral valve ring Pulmonary hypertension -Last echo 2/22 noted EF of 55-60%, moderate to severe TR, moderate PAH, moderate MR -Followed by heart failure clinic -Clinically euvolemic at this time    Paroxysmal atrial fibrillation, atrial flutter Patient has remained on paced rhythm. -Was on hold as above   Hyponatremia, hypokalemia, hypomagnesemia and hypocalcemia/ non anion gap metabolic acidosis -Improved, repleted   SLE Interstitial lung disease No signs of acute exacerbation.   -Followed by [name] Continue with Nintedanib and hydroxychloroquine    Secondary adrenal insufficiency.  No acute decompensation, continue with oral prednisone at her home dose.     Hypothyroid.  Continue with levothyroxine     HTN  Blood pressure 159/67 this am with HR 70, continue with metoprolol.   Gout. No acute flare, continue with allopurinol.   DVT prophylaxis: Scd   Code Status:   full  Family Communication:  No family at bedside, updated daughter  Status is: Inpatient  Remains inpatient appropriate because: Severity of illness  Consultants:  GI   Procedures:   IR     Subjective: -Feels okay overall, denies any pain, did have an episode of maroon BRBPR yesterday  Objective: Vitals:   12/16/20 2044 12/16/20 2334 12/17/20 0416 12/17/20 0744  BP: 121/62 (!) 126/58 114/62 123/60  Pulse: 70 70 70 69  Resp: 18 20 18 16   Temp: 97.9 F (36.6 C) 97.6 F (36.4 C) 97.8 F (36.6 C) 98.2 F (36.8 C)  TempSrc: Oral Oral Oral Oral  SpO2: 100% 100% 100% 100%  Weight:      Height:        Intake/Output Summary (Last 24 hours) at 12/17/2020 1211 Last data filed at 12/16/2020 1300 Gross per 24 hour  Intake 0 ml  Output --  Net 0 ml   Filed Weights   12/13/20 2218 12/14/20 1401  Weight: 53.5 kg 53.7 kg    Examination:   General: Pleasant elderly female sitting up in bed, AAOx3, no distress HEENT: No JVD CVS: S1-S2, regular rate rhythm Lungs: Fine diffuse bilateral crackles Abdomen: Soft, nontender, bowel sounds present Extremities: No edema Skin: no new rashes on exposed skin Musculoskeletal: no joint deformities  Data Reviewed: I have personally reviewed following labs and imaging studies  CBC: Recent Labs  Lab 12/13/20 1846 12/13/20  1928 12/15/20 0330 12/15/20 1853 12/16/20 0239 12/16/20 1556 12/17/20 0218  WBC 5.5  --  6.7  --  6.3  --  6.5  NEUTROABS 2.7  --   --   --   --   --   --   HGB 6.0*   < > 8.6* 9.7* 7.9* 8.5* 8.2*  HCT  19.2*   < > 25.5* 29.2* 23.9* 25.3* 24.2*  MCV 104.9*  --  94.1  --  95.6  --  96.0  PLT 91*  --  100*  --  96*  --  98*   < > = values in this interval not displayed.   Basic Metabolic Panel: Recent Labs  Lab 12/13/20 1846 12/13/20 1928 12/13/20 2133 12/14/20 1432 12/15/20 0330 12/17/20 0218  NA 140 145  --  134* 134* 138  K 2.8* 2.9*  --  4.9 4.1 3.8  CL 118* 116*  --  108 107 108  CO2 17*  --   --  19* 21* 26  GLUCOSE 92 87  --  198* 83 66*  BUN 15 15  --  19 18 9   CREATININE 0.75 0.70  --  0.99 0.78 0.81  CALCIUM 6.2*  --   --  8.0* 8.0* 8.5*  MG  --   --  1.6*  --   --  1.9   GFR: Estimated Creatinine Clearance: 48.9 mL/min (by C-G formula based on SCr of 0.81 mg/dL). Liver Function Tests: Recent Labs  Lab 12/13/20 1846  AST 26  ALT 17  ALKPHOS 27*  BILITOT 0.6  PROT 4.4*  ALBUMIN 1.8*   No results for input(s): LIPASE, AMYLASE in the last 168 hours. No results for input(s): AMMONIA in the last 168 hours. Coagulation Profile: Recent Labs  Lab 12/16/20 1130  INR 1.2   Cardiac Enzymes: No results for input(s): CKTOTAL, CKMB, CKMBINDEX, TROPONINI in the last 168 hours. BNP (last 3 results) No results for input(s): PROBNP in the last 8760 hours. HbA1C: No results for input(s): HGBA1C in the last 72 hours. CBG: Recent Labs  Lab 12/13/20 1829  GLUCAP 109*   Lipid Profile: No results for input(s): CHOL, HDL, LDLCALC, TRIG, CHOLHDL, LDLDIRECT in the last 72 hours. Thyroid Function Tests: No results for input(s): TSH, T4TOTAL, FREET4, T3FREE, THYROIDAB in the last 72 hours. Anemia Panel: No results for input(s): VITAMINB12, FOLATE, FERRITIN, TIBC, IRON, RETICCTPCT in the last 72 hours.    Radiology Studies: I have reviewed all of the imaging during this hospital visit personally  Scheduled Meds:  allopurinol  100 mg Oral q morning   cetirizine HCl  5 mg Oral QHS   ferrous sulfate  325 mg Oral Q lunch   folic acid  1 mg Oral Q lunch    hydroxychloroquine  200 mg Oral q morning   influenza vaccine adjuvanted  0.5 mL Intramuscular Tomorrow-1000   levothyroxine  25 mcg Oral QAC breakfast   metoprolol succinate  25 mg Oral Daily   multivitamin with minerals  1 tablet Oral Q lunch   Nintedanib  150 mg Oral QHS   predniSONE  5 mg Oral Q breakfast   vitamin B-12  1,000 mcg Oral Q lunch   Continuous Infusions:   LOS: 4 days    Domenic Polite, MD

## 2020-12-17 NOTE — Care Management Important Message (Signed)
Important Message  Patient Details  Name: Lindsey Huber MRN: [id number] Date of Birth: 12-15-1945   Medicare Important Message Given:  Yes     Wadie Lessen 12/17/2020, 11:13 AM

## 2020-12-17 NOTE — Progress Notes (Signed)
Mobility Specialist Progress Note   12/17/20 1115  Mobility  Activity Ambulated in hall  Level of Assistance Modified independent, requires aide device or extra time  Assistive Device [facility] Ambulated (ft) 550 ft  Mobility Ambulated independently in hallway  Mobility Response Tolerated well  Mobility performed by Mobility specialist  $Mobility charge 1 Mobility   Received pt in bed having no complaints and agreeable to mobility. Asymptomatic throughout ambulation, returned back to bed w/ call bell by side prepping to be transported for a procedure.   Pre Mobility: 78 HR, 15 RR During Mobility: 83 HR, 31 RR Post Mobility: 72 HR, 12 RR  Judeen Hammans Phone Number [phone]

## 2020-12-18 DIAGNOSIS — K922 Gastrointestinal hemorrhage, unspecified: Secondary | ICD-10-CM | POA: Diagnosis not present

## 2020-12-18 LAB — BASIC METABOLIC PANEL
Anion gap: 7 (ref 5–15)
BUN: 11 mg/dL (ref 8–23)
CO2: 25 mmol/L (ref 22–32)
Calcium: 8.2 mg/dL — ABNORMAL LOW (ref 8.9–10.3)
Chloride: 103 mmol/L (ref 98–111)
Creatinine, Ser: 0.9 mg/dL (ref 0.44–1.00)
GFR, Estimated: >60 mL/min (ref >60)
Glucose, Bld: 54 mg/dL — ABNORMAL LOW (ref 70–99)
Potassium: 4.1 mmol/L (ref 3.5–5.1)
Sodium: 135 mmol/L (ref 135–145)

## 2020-12-18 LAB — GLUCOSE, CAPILLARY
Glucose-Capillary: 49 mg/dL — ABNORMAL LOW (ref 70–99)
Glucose-Capillary: 153 mg/dL — ABNORMAL HIGH (ref 70–99)
Glucose-Capillary: 52 mg/dL — ABNORMAL LOW (ref 70–99)
Glucose-Capillary: 64 mg/dL — ABNORMAL LOW (ref 70–99)
Glucose-Capillary: 102 mg/dL — ABNORMAL HIGH (ref 70–99)
Glucose-Capillary: 131 mg/dL — ABNORMAL HIGH (ref 70–99)
Glucose-Capillary: 116 mg/dL — ABNORMAL HIGH (ref 70–99)

## 2020-12-18 LAB — CBC
HCT: 24.9 % — ABNORMAL LOW (ref 36.0–46.0)
Hemoglobin: 8.2 g/dL — ABNORMAL LOW (ref 12.0–15.0)
MCH: 31.8 pg (ref 26.0–34.0)
MCHC: 32.9 g/dL (ref 30.0–36.0)
MCV: 96.5 fL (ref 80.0–100.0)
Platelets: 116 10*3/uL — ABNORMAL LOW (ref 150–400)
RBC: 2.58 MIL/uL — ABNORMAL LOW (ref 3.87–5.11)
RDW: 14.8 % (ref 11.5–15.5)
WBC: 5.9 10*3/uL (ref 4.0–10.5)
nRBC: 0 % (ref 0.0–0.2)

## 2020-12-18 MED ORDER — DEXTROSE 50 % IV SOLN: 1.0000 | Freq: Once | INTRAVENOUS | Status: AC

## 2020-12-18 MED ORDER — DEXTROSE 50 % IV SOLN
INTRAVENOUS | Status: AC
  Administered 2020-12-18: 50 mL via INTRAVENOUS
  Filled 2020-12-18: qty 50

## 2020-12-18 NOTE — Progress Notes (Signed)
Mobility Specialist Progress Note   12/18/20 0930  Mobility  Activity Ambulated in hall;Ambulated in room;Ambulated to bathroom  Level of Assistance Modified independent, requires aide device or extra time  Assistive Device [employer] Ambulated (ft) 580 ft  Mobility Out of bed for toileting;Ambulated with assistance in room;Ambulated with assistance in hallway  Mobility Response Tolerated well  Mobility performed by Mobility specialist  $Mobility charge 1 Mobility   Received pt needing help to bathroom, mod ind. For bed mobility and ambulation to BR. X2 successful BM's, blood was present in first BM but not in second. Pt reported no symptoms post BM. Pt ambulates find but did fatigue a lot quicker than normal for her baseline she claimed and took x1 standing break. Returned back to bed w/ call bell by side, alarm on and NT and RN notified about BM.   Pre Mobility: 92 HR, 129/66 BP, 85% SpO2 on RA  During Mobility: 112 HR, 95% SpO2 on RA  Post Mobility: 75 HR, 121/48 BP, 100% SpO2 on RA  Frederico Hamman Mobility Specialist Phone Number [phone]

## 2020-12-18 NOTE — Progress Notes (Signed)
[facility] Gastroenterology Progress Note  Lindsey Huber 75 y.o. 07/16/1945  CC: Acute lower GI bleed   Subjective: Patient seen and examined at bedside.  Continues to have blood clots mixed with the stool.  ROS : Afebrile, negative for nausea and vomiting   Objective: Vital signs in last 24 hours: Vitals:   12/18/20 0830 12/18/20 0903  BP: 128/61 129/66  Pulse: 67 71  Resp: 16 15  Temp: 98.4 F (36.9 C) 98.3 F (36.8 C)  SpO2: 100% 98%    Physical Exam:  General : Elderly patient, not in acute distress Abdomen : Soft, nontender, nondistended, bowel sounds present.  No peritoneal signs Psych : Mood and affect normal Neuro : Alert and oriented x3  Lab Results: Recent Labs    12/17/20 0218 12/18/20 0148  NA 138 135  K 3.8 4.1  CL 108 103  CO2 26 25  GLUCOSE 66* 54*  BUN 9 11  CREATININE 0.81 0.90  CALCIUM 8.5* 8.2*  MG 1.9  --    No results for input(s): AST, ALT, ALKPHOS, BILITOT, PROT, ALBUMIN in the last 72 hours.  Recent Labs    12/17/20 1523 12/18/20 0148  WBC 10.1 5.9  HGB 8.9* 8.2*  HCT 27.2* 24.9*  MCV 97.8 96.5  PLT 119* 116*   Recent Labs    12/16/20 1130  LABPROT 14.8  INR 1.2      Assessment/Plan: -Acute lower GI bleed with CT angio showing active bleeding in a mid descending colon probably from diverticular bleed. -Acute blood loss anemia.   -History of atrial fibrillation -Xarelto on hold. 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 -Mild drop in hemoglobin noted but hemoglobin relatively stable -If continues to have bleeding or drop in hemoglobin, we will consider colonoscopy in the next few days. -Continue to hold Xarelto.   -Monitor H&H.  Transfuse if hemoglobin less than 8.  GI will follow  Kathi Der MD,  FACP 12/18/2020, 10:39 AM  Contact #  [phone]

## 2020-12-18 NOTE — Progress Notes (Signed)
PROGRESS NOTE    Lindsey Huber  [id number]:[id number] DOB: November 23, 1945 DOA: 12/13/2020 PCP: Collene Mares, PA    Brief Narrative:  75/F chronically ill with chronic diastolic CHF, valvular heart disease s/p bioprosthetic AVR, mitral valve annuloplasty, bioprosthetic TVR, paroxysmal atrial fibrillation on Eliquis, history of VT status post AICD, history of lupus, interstitial lung disease, pulmonary hypertension -Was admitted with hematochezia and severe acute blood loss anemia, hemoglobin of 6  -CT angiography noted acute gastrointestinal hemorrhage involving the mid descending colon likely representing an active diverticular hemorrhage.   -She was transfused 2 units of PRBC, anticoagulation was held and received Kcentra for reversal -seen by gastroenterology and and interventional radiology in consultation  -IR was reconsulted 11/22 for ongoing bleeding  Assessment & Plan:     Acute diverticular bleeding  Acute blood loss anemia -Transfused 2 units of PRBC this admission -CTA 11/19 noted acute GI hemorrhage involving mid descending colon, likely acute diverticular hge -Bleeding has continued to wax and wane, recurrent bleeding persists, not profuse anymore -IR was reconsulted, nuclear medicine scan was negative -Hemoglobin is relatively stable, gastroenterology following, may consider colonoscopy if bleeding persists -CBC in a.m.  Chronic diastolic CHF Valvular heart disease, s/p bioprosthetic AVR, TVR, mitral valve ring Pulmonary hypertension -Last echo 2/22 noted EF of 55-60%, moderate to severe TR, moderate PAH, moderate MR -Followed by heart failure clinic -Clinically euvolemic at this time    Paroxysmal atrial fibrillation, atrial flutter Patient has remained on paced rhythm. -Was on hold as above   Hyponatremia, hypokalemia, hypomagnesemia and hypocalcemia/ non anion gap metabolic acidosis -Improved, repleted   SLE Interstitial lung disease No signs of acute  exacerbation.  -Followed by [name] Continue with Nintedanib and hydroxychloroquine    Secondary adrenal insufficiency.  No acute decompensation, continue with oral prednisone at her home dose.     Hypothyroid.  Continue with levothyroxine     HTN  Blood pressure 159/67 this am with HR 70, continue with metoprolol.   Gout. No acute flare, continue with allopurinol.   DVT prophylaxis: Scd   Code Status:   full  Family Communication:  No family at bedside, updated daughter yesterday  Status is: Inpatient  Remains inpatient appropriate because: Severity of illness  Consultants:  GI   Procedures:   IR     Subjective: -Feels okay overall, denies any pain or dyspnea, did have BM with blood clots yesterday  Objective: Vitals:   12/18/20 0407 12/18/20 0800 12/18/20 0830 12/18/20 0903  BP:  122/73 128/61 129/66  Pulse: 72 71 67 71  Resp:  20 16 15   Temp: 98.1 F (36.7 C)  98.4 F (36.9 C) 98.3 F (36.8 C)  TempSrc: Oral Oral Oral Oral  SpO2: 97% 99% 100% 98%  Weight:      Height:       No intake or output data in the 24 hours ending 12/18/20 1157  Filed Weights   12/13/20 2218 12/14/20 1401  Weight: 53.5 kg 53.7 kg    Examination:   General: Chronically ill telemetry built female laying in bed awake, Alert, Oriented X 3, no distress HEENT: no JVD Lungs: Fine diffuse bilateral crackles CVS: S1S2/RRR Abd: soft, Non tender, non distended, BS present Extremities: No edema Skin: no new rashes on exposed skin   Data Reviewed: I have personally reviewed following labs and imaging studies  CBC: Recent Labs  Lab 12/13/20 1846 12/13/20 1928 12/15/20 0330 12/15/20 1853 12/16/20 0239 12/16/20 1556 12/17/20 0218 12/17/20 1523 12/18/20 0148  WBC 5.5  --  6.7  --  6.3  --  6.5 10.1 5.9  NEUTROABS 2.7  --   --   --   --   --   --   --   --   HGB 6.0*   < > 8.6*   < > 7.9* 8.5* 8.2* 8.9* 8.2*  HCT 19.2*   < > 25.5*   < > 23.9* 25.3* 24.2* 27.2* 24.9*   MCV 104.9*  --  94.1  --  95.6  --  96.0 97.8 96.5  PLT 91*  --  100*  --  96*  --  98* 119* 116*   < > = values in this interval not displayed.   Basic Metabolic Panel: Recent Labs  Lab 12/13/20 1846 12/13/20 1928 12/13/20 2133 12/14/20 1432 12/15/20 0330 12/17/20 0218 12/18/20 0148  NA 140 145  --  134* 134* 138 135  K 2.8* 2.9*  --  4.9 4.1 3.8 4.1  CL 118* 116*  --  108 107 108 103  CO2 17*  --   --  19* 21* 26 25  GLUCOSE 92 87  --  198* 83 66* 54*  BUN 15 15  --  19 18 9 11   CREATININE 0.75 0.70  --  0.99 0.78 0.81 0.90  CALCIUM 6.2*  --   --  8.0* 8.0* 8.5* 8.2*  MG  --   --  1.6*  --   --  1.9  --    GFR: Estimated Creatinine Clearance: 44 mL/min (by C-G formula based on SCr of 0.9 mg/dL). Liver Function Tests: Recent Labs  Lab 12/13/20 1846  AST 26  ALT 17  ALKPHOS 27*  BILITOT 0.6  PROT 4.4*  ALBUMIN 1.8*   No results for input(s): LIPASE, AMYLASE in the last 168 hours. No results for input(s): AMMONIA in the last 168 hours. Coagulation Profile: Recent Labs  Lab 12/16/20 1130  INR 1.2   Cardiac Enzymes: No results for input(s): CKTOTAL, CKMB, CKMBINDEX, TROPONINI in the last 168 hours. BNP (last 3 results) No results for input(s): PROBNP in the last 8760 hours. HbA1C: No results for input(s): HGBA1C in the last 72 hours. CBG: Recent Labs  Lab 12/13/20 1829 12/18/20 0417 12/18/20 0546 12/18/20 0909 12/18/20 1145  GLUCAP 109* 49* 153* 52* 64*   Lipid Profile: No results for input(s): CHOL, HDL, LDLCALC, TRIG, CHOLHDL, LDLDIRECT in the last 72 hours. Thyroid Function Tests: No results for input(s): TSH, T4TOTAL, FREET4, T3FREE, THYROIDAB in the last 72 hours. Anemia Panel: No results for input(s): VITAMINB12, FOLATE, FERRITIN, TIBC, IRON, RETICCTPCT in the last 72 hours.    Radiology Studies: I have reviewed all of the imaging during this hospital visit personally  Scheduled Meds:  allopurinol  100 mg Oral q morning   cetirizine HCl   5 mg Oral QHS   ferrous sulfate  325 mg Oral Q lunch   folic acid  1 mg Oral Q lunch   hydroxychloroquine  200 mg Oral q morning   influenza vaccine adjuvanted  0.5 mL Intramuscular Tomorrow-1000   levothyroxine  25 mcg Oral QAC breakfast   metoprolol succinate  25 mg Oral Daily   multivitamin with minerals  1 tablet Oral Q lunch   Nintedanib  150 mg Oral QHS   predniSONE  5 mg Oral Q breakfast   vitamin B-12  1,000 mcg Oral Q lunch   Continuous Infusions:   LOS: 5 days    12/20/20, MD

## 2020-12-19 DIAGNOSIS — K922 Gastrointestinal hemorrhage, unspecified: Secondary | ICD-10-CM | POA: Diagnosis not present

## 2020-12-19 LAB — BASIC METABOLIC PANEL
Anion gap: 5 (ref 5–15)
BUN: 16 mg/dL (ref 8–23)
CO2: 29 mmol/L (ref 22–32)
Calcium: 8.3 mg/dL — ABNORMAL LOW (ref 8.9–10.3)
Chloride: 103 mmol/L (ref 98–111)
Creatinine, Ser: 1.14 mg/dL — ABNORMAL HIGH (ref 0.44–1.00)
GFR, Estimated: 51 mL/min — ABNORMAL LOW (ref >60)
Glucose, Bld: 117 mg/dL — ABNORMAL HIGH (ref 70–99)
Potassium: 4.2 mmol/L (ref 3.5–5.1)
Sodium: 137 mmol/L (ref 135–145)

## 2020-12-19 LAB — CBC
HCT: 24 % — ABNORMAL LOW (ref 36.0–46.0)
Hemoglobin: 7.8 g/dL — ABNORMAL LOW (ref 12.0–15.0)
MCH: 32 pg (ref 26.0–34.0)
MCHC: 32.5 g/dL (ref 30.0–36.0)
MCV: 98.4 fL (ref 80.0–100.0)
Platelets: 118 10*3/uL — ABNORMAL LOW (ref 150–400)
RBC: 2.44 MIL/uL — ABNORMAL LOW (ref 3.87–5.11)
RDW: 14.6 % (ref 11.5–15.5)
WBC: 5.9 10*3/uL (ref 4.0–10.5)
nRBC: 0 % (ref 0.0–0.2)

## 2020-12-19 MED ORDER — SODIUM CHLORIDE 0.9 % IV SOLN: INTRAVENOUS | Status: DC

## 2020-12-19 MED ORDER — PEG 3350-KCL-NA BICARB-NACL 420 G PO SOLR
4000.0000 mL | Freq: Once | ORAL | Status: AC
  Administered 2020-12-19: 4000 mL via ORAL
  Filled 2020-12-19: qty 4000

## 2020-12-19 NOTE — Progress Notes (Addendum)
Mobility Specialist Progress Note   12/19/20 1600  Mobility  Activity Ambulated in hall  Level of Assistance Modified independent, requires aide device or extra time  Assistive Device Front wheel walker  Distance Ambulated (ft) 550 ft  Mobility Ambulated independently in hallway  Mobility Response Tolerated well  Mobility performed by Mobility specialist  $Mobility charge 1 Mobility   Received pt in BR having no complaints and agreeable to mobility. Asymptomatic throughout ambulation, returned back to bed w/ call bell by side and all needs met.  Holland Falling Mobility Specialist Phone Number [phone]

## 2020-12-19 NOTE — Progress Notes (Signed)
[facility] Gastroenterology Progress Note  Lindsey Huber 75 y.o. 07/11/1945  CC: Acute lower GI bleed   Subjective: Patient seen and examined at bedside.  She continues to have intermittent rectal bleeding.  Mild drop in hemoglobin noted.  Denies any abdominal pain.  ROS : Afebrile, negative for nausea and vomiting   Objective: Vital signs in last 24 hours: Vitals:   12/18/20 2013 12/19/20 0610  BP: 134/68 (!) 110/54  Pulse: 69 72  Resp: 15 20  Temp: 98.4 F (36.9 C) 97.8 F (36.6 C)  SpO2: 98% 99%    Physical Exam:  General : Elderly patient, not in acute distress Abdomen : Soft, nontender, nondistended, bowel sounds present.  No peritoneal signs Psych : Mood and affect normal Neuro : Alert and oriented x3  Lab Results: Recent Labs    12/17/20 0218 12/18/20 0148 12/19/20 0212  NA 138 135 137  K 3.8 4.1 4.2  CL 108 103 103  CO2 26 25 29   GLUCOSE 66* 54* 117*  BUN 9 11 16   CREATININE 0.81 0.90 1.14*  CALCIUM 8.5* 8.2* 8.3*  MG 1.9  --   --    No results for input(s): AST, ALT, ALKPHOS, BILITOT, PROT, ALBUMIN in the last 72 hours.  Recent Labs    12/18/20 0148 12/19/20 0212  WBC 5.9 5.9  HGB 8.2* 7.8*  HCT 24.9* 24.0*  MCV 96.5 98.4  PLT 116* 118*   Recent Labs    12/16/20 1130  LABPROT 14.8  INR 1.2      Assessment/Plan: -Acute lower GI bleed with CT angio showing active bleeding in a mid descending colon probably from diverticular bleed.  -Acute blood loss anemia.   -History of atrial fibrillation -Xarelto on hold. 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 -Discussed with patient's daughter over the phone. -Okay to have clear liquids diet today.  Keep n.p.o. past midnight. -Continue to hold Xarelto.    Risks (bleeding, infection, bowel perforation that could require surgery, sedation-related changes in cardiopulmonary systems), benefits (identification and possible treatment of source of symptoms, exclusion of certain causes of symptoms), and alternatives (watchful waiting, radiographic imaging studies, empiric medical treatment)  were explained to patient/family in detail and patient wishes to proceed.   12/19 MD, FACP 12/19/2020, 9:07 AM  Contact #  [phone]

## 2020-12-19 NOTE — Progress Notes (Signed)
PROGRESS NOTE    Lindsey Huber  UXL:[id number] DOB: [birth year] DOA: 12/13/2020 PCP: Collene Mares, PA    Brief Narrative:  75/F chronically ill with chronic diastolic CHF, valvular heart disease s/p bioprosthetic AVR, mitral valve annuloplasty, bioprosthetic TVR, paroxysmal atrial fibrillation on Eliquis, history of VT status post AICD, history of lupus, interstitial lung disease, pulmonary hypertension -Was admitted with hematochezia and severe acute blood loss anemia, hemoglobin of 6  -CT angiography noted acute gastrointestinal hemorrhage involving the mid descending colon likely representing an active diverticular hemorrhage.   -She was transfused 2 units of PRBC, anticoagulation was held and received Kcentra for reversal -seen by gastroenterology and and interventional radiology in consultation  -IR was reconsulted 11/22 for ongoing bleeding  Assessment & Plan:     Acute diverticular bleeding  Acute blood loss anemia -Transfused 2 units of PRBC this admission -CTA 11/19 noted acute GI hemorrhage involving mid descending colon, likely acute diverticular hge -Bleeding has continued to wax and wane, recurrent bleeding persists, not profuse anymore -IR was reconsulted, nuclear medicine scan was negative -Multiple episodes of bleeding with clots yesterday, plan for colonoscopy tomorrow, slight downtrend in hemoglobin noted, check CBC in a.m.  Chronic diastolic CHF Valvular heart disease, s/p bioprosthetic AVR, TVR, mitral valve ring Pulmonary hypertension -Last echo 2/22 noted EF of 55-60%, moderate to severe TR, moderate PAH, moderate MR -Followed by heart failure clinic -Clinically euvolemic at this time    Paroxysmal atrial fibrillation, atrial flutter Patient has remained on paced rhythm. -Was on hold as above   Hyponatremia, hypokalemia, hypomagnesemia and hypocalcemia/ non anion gap metabolic acidosis -Improved, repleted   SLE Interstitial lung disease No signs  of acute exacerbation.  -Followed by [name] Continue with Nintedanib and hydroxychloroquine    Secondary adrenal insufficiency.  No acute decompensation, continue with oral prednisone at her home dose.     Hypothyroid.  Continue with levothyroxine     HTN  -Stable, continue metoprolol  Gout.  -continue with allopurinol.   DVT prophylaxis: Scd   Code Status:   full  Family Communication:  No family at bedside, updated daughter yesterday  Status is: Inpatient  Remains inpatient appropriate because: Severity of illness  Consultants:  GI   Procedures:   IR     Subjective: -Had 2 episodes of bloody stools with clots yesterday, breathing okay  Objective: Vitals:   12/18/20 1559 12/18/20 2013 12/19/20 0610 12/19/20 1017  BP: (!) 105/48 134/68 (!) 110/54 (!) 142/74  Pulse: 70 69 72 72  Resp: 16 15 20  (!) 22  Temp: 98.8 F (37.1 C) 98.4 F (36.9 C) 97.8 F (36.6 C) 97.7 F (36.5 C)  TempSrc: Oral Oral  Oral  SpO2: 100% 98% 99% 95%  Weight:      Height:       No intake or output data in the 24 hours ending 12/19/20 1151  Filed Weights   12/13/20 2218 12/14/20 1401  Weight: 53.5 kg 53.7 kg    Examination:   General: Chronically ill frail female sitting up in bed, AAOx3, no distress HEENT: No JVD CVS: S1-S2, regular rate rhythm Lungs: Fine diffuse bilateral crackles Abdomen: Soft, nontender, bowel sounds present Extremities: No edema Skin: no new rashes on exposed skin   Data Reviewed: I have personally reviewed following labs and imaging studies  CBC: Recent Labs  Lab 12/13/20 1846 12/13/20 1928 12/16/20 0239 12/16/20 1556 12/17/20 0218 12/17/20 1523 12/18/20 0148 12/19/20 0212  WBC 5.5   < > 6.3  --  6.5 10.1 5.9 5.9  NEUTROABS 2.7  --   --   --   --   --   --   --   HGB 6.0*   < > 7.9* 8.5* 8.2* 8.9* 8.2* 7.8*  HCT 19.2*   < > 23.9* 25.3* 24.2* 27.2* 24.9* 24.0*  MCV 104.9*   < > 95.6  --  96.0 97.8 96.5 98.4  PLT 91*   < > 96*  --   98* 119* 116* 118*   < > = values in this interval not displayed.   Basic Metabolic Panel: Recent Labs  Lab 12/13/20 2133 12/14/20 1432 12/15/20 0330 12/17/20 0218 12/18/20 0148 12/19/20 0212  NA  --  134* 134* 138 135 137  K  --  4.9 4.1 3.8 4.1 4.2  CL  --  108 107 108 103 103  CO2  --  19* 21* 26 25 29   GLUCOSE  --  198* 83 66* 54* 117*  BUN  --  19 18 9 11 16   CREATININE  --  0.99 0.78 0.81 0.90 1.14*  CALCIUM  --  8.0* 8.0* 8.5* 8.2* 8.3*  MG 1.6*  --   --  1.9  --   --    GFR: Estimated Creatinine Clearance: 34.8 mL/min (A) (by C-G formula based on SCr of 1.14 mg/dL (H)). Liver Function Tests: Recent Labs  Lab 12/13/20 1846  AST 26  ALT 17  ALKPHOS 27*  BILITOT 0.6  PROT 4.4*  ALBUMIN 1.8*   No results for input(s): LIPASE, AMYLASE in the last 168 hours. No results for input(s): AMMONIA in the last 168 hours. Coagulation Profile: Recent Labs  Lab 12/16/20 1130  INR 1.2   Cardiac Enzymes: No results for input(s): CKTOTAL, CKMB, CKMBINDEX, TROPONINI in the last 168 hours. BNP (last 3 results) No results for input(s): PROBNP in the last 8760 hours. HbA1C: No results for input(s): HGBA1C in the last 72 hours. CBG: Recent Labs  Lab 12/18/20 0909 12/18/20 1145 12/18/20 1348 12/18/20 1556 12/18/20 2013  GLUCAP 52* 64* 102* 131* 116*   Lipid Profile: No results for input(s): CHOL, HDL, LDLCALC, TRIG, CHOLHDL, LDLDIRECT in the last 72 hours. Thyroid Function Tests: No results for input(s): TSH, T4TOTAL, FREET4, T3FREE, THYROIDAB in the last 72 hours. Anemia Panel: No results for input(s): VITAMINB12, FOLATE, FERRITIN, TIBC, IRON, RETICCTPCT in the last 72 hours.    Radiology Studies: I have reviewed all of the imaging during this hospital visit personally  Scheduled Meds:  allopurinol  100 mg Oral q morning   cetirizine HCl  5 mg Oral QHS   ferrous sulfate  325 mg Oral Q lunch   folic acid  1 mg Oral Q lunch   hydroxychloroquine  200 mg Oral q  morning   influenza vaccine adjuvanted  0.5 mL Intramuscular Tomorrow-1000   levothyroxine  25 mcg Oral QAC breakfast   metoprolol succinate  25 mg Oral Daily   multivitamin with minerals  1 tablet Oral Q lunch   Nintedanib  150 mg Oral QHS   polyethylene glycol-electrolytes  4,000 mL Oral Once   predniSONE  5 mg Oral Q breakfast   vitamin B-12  1,000 mcg Oral Q lunch   Continuous Infusions:   LOS: 6 days    12/20/20, MD

## 2020-12-20 ENCOUNTER — Inpatient Hospital Stay (HOSPITAL_COMMUNITY): Payer: Medicare HMO | Admitting: Anesthesiology

## 2020-12-20 ENCOUNTER — Encounter (HOSPITAL_COMMUNITY)
Admission: EM | Disposition: A | Discharge status: Home / Self Care | Payer: Self-pay | Source: Home / Self Care | Attending: Internal Medicine

## 2020-12-20 DIAGNOSIS — K922 Gastrointestinal hemorrhage, unspecified: Secondary | ICD-10-CM | POA: Diagnosis not present

## 2020-12-20 HISTORY — PX: COLONOSCOPY WITH PROPOFOL: SHX5780

## 2020-12-20 LAB — CBC
HCT: 26.8 % — ABNORMAL LOW (ref 36.0–46.0)
Hemoglobin: 8.9 g/dL — ABNORMAL LOW (ref 12.0–15.0)
MCH: 32.6 pg (ref 26.0–34.0)
MCHC: 33.2 g/dL (ref 30.0–36.0)
MCV: 98.2 fL (ref 80.0–100.0)
Platelets: 134 10*3/uL — ABNORMAL LOW (ref 150–400)
RBC: 2.73 MIL/uL — ABNORMAL LOW (ref 3.87–5.11)
RDW: 14.7 % (ref 11.5–15.5)
WBC: 6.5 10*3/uL (ref 4.0–10.5)
nRBC: 0 % (ref 0.0–0.2)

## 2020-12-20 LAB — BASIC METABOLIC PANEL
Anion gap: 7 (ref 5–15)
BUN: 8 mg/dL (ref 8–23)
CO2: 25 mmol/L (ref 22–32)
Calcium: 8.7 mg/dL — ABNORMAL LOW (ref 8.9–10.3)
Chloride: 109 mmol/L (ref 98–111)
Creatinine, Ser: 0.8 mg/dL (ref 0.44–1.00)
GFR, Estimated: >60 mL/min (ref >60)
Glucose, Bld: 88 mg/dL (ref 70–99)
Potassium: 4.2 mmol/L (ref 3.5–5.1)
Sodium: 141 mmol/L (ref 135–145)

## 2020-12-20 SURGERY — COLONOSCOPY WITH PROPOFOL
Anesthesia: Monitor Anesthesia Care

## 2020-12-20 MED ORDER — PHENYLEPHRINE HCL (PRESSORS) 10 MG/ML IV SOLN
INTRAVENOUS | Status: DC | PRN
  Administered 2020-12-20 (×2): 80 ug via INTRAVENOUS

## 2020-12-20 MED ORDER — LACTATED RINGERS IV SOLN
INTRAVENOUS | Status: AC | PRN
  Administered 2020-12-20: 1000 mL via INTRAVENOUS

## 2020-12-20 MED ORDER — EPHEDRINE SULFATE 50 MG/ML IJ SOLN
INTRAMUSCULAR | Status: DC | PRN
  Administered 2020-12-20: 15 mg via INTRAVENOUS
  Administered 2020-12-20: 35 mg via INTRAVENOUS

## 2020-12-20 MED ORDER — PROPOFOL 10 MG/ML IV BOLUS
INTRAVENOUS | Status: DC | PRN
  Administered 2020-12-20: 20 mg via INTRAVENOUS
  Administered 2020-12-20: 30 mg via INTRAVENOUS

## 2020-12-20 MED ORDER — LIDOCAINE HCL (CARDIAC) PF 100 MG/5ML IV SOSY
PREFILLED_SYRINGE | INTRAVENOUS | Status: DC | PRN
  Administered 2020-12-20: 50 mg via INTRAVENOUS

## 2020-12-20 SURGICAL SUPPLY — 22 items

## 2020-12-20 NOTE — Progress Notes (Signed)
Mobility Specialist Progress Note   12/20/20 1325  Mobility  Activity Contraindicated/medical hold (d/t procedure today)   Frederico Hamman Mobility Specialist Phone Number [phone]

## 2020-12-20 NOTE — Anesthesia Postprocedure Evaluation (Signed)
Anesthesia Post Note  Patient: Lindsey Huber  Procedure(s) Performed: COLONOSCOPY WITH PROPOFOL     Patient location during evaluation: Endoscopy Anesthesia Type: MAC Level of consciousness: awake and alert Pain management: pain level controlled Vital Signs Assessment: post-procedure vital signs reviewed and stable Respiratory status: spontaneous breathing, nonlabored ventilation, respiratory function stable and patient connected to nasal cannula oxygen Cardiovascular status: stable and blood pressure returned to baseline Postop Assessment: no apparent nausea or vomiting Anesthetic complications: no   No notable events documented.  Last Vitals:  Vitals:   12/20/20 1421 12/20/20 1436  BP: (!) 104/51 (!) 109/59  Pulse:    Resp: 12 17  Temp: 36.6 C 36.6 C  SpO2: 96% 97%    Last Pain:  Vitals:   12/20/20 1436  TempSrc:   PainSc: 0-No pain                 Janyra Barillas,W. EDMOND

## 2020-12-20 NOTE — Transfer of Care (Signed)
Immediate Anesthesia Transfer of Care Note  Patient: Lindsey Huber  Procedure(s) Performed: COLONOSCOPY WITH PROPOFOL  Patient Location: PACU  Anesthesia Type:MAC  Level of Consciousness: awake, alert , oriented and patient cooperative  Airway & Oxygen Therapy: Patient Spontanous Breathing  Post-op Assessment: Report given to RN, Post -op Vital signs reviewed and stable and Patient moving all extremities X 4  Post vital signs: Reviewed and stable  Last Vitals:  Vitals Value Taken Time  BP 104/51 12/20/20 1421  Temp 36.6 C 12/20/20 1421  Pulse 27 12/20/20 1423  Resp 15 12/20/20 1423  SpO2 67 % 12/20/20 1423  Vitals shown include unvalidated device data.  Last Pain:  Vitals:   12/20/20 1235  TempSrc: Oral  PainSc: 0-No pain         Complications: No notable events documented.

## 2020-12-20 NOTE — Plan of Care (Signed)

## 2020-12-20 NOTE — Op Note (Signed)
[facility] Patient Name: Lindsey Huber Procedure Date : 12/20/2020 MRN: [id number] Attending MD: Arta Silence , MD Date of Birth: May 04, 1945 CSN: [id number] Age: 75 Admit Type: Outpatient Procedure:                Colonoscopy Indications:              Hematochezia, Acute post hemorrhagic anemia Providers:                Arta Silence, MD, Benetta Spar, Technician,                            Jerene Pitch Person Referring MD:             [facility]. Medicines:                Monitored Anesthesia Care Complications:            No immediate complications. Estimated Blood Loss:     Estimated blood loss: none. Procedure:                Pre-Anesthesia Assessment:                           - Prior to the procedure, a History and Physical                            was performed, and patient medications and                            allergies were reviewed. The patient's tolerance of                            previous anesthesia was also reviewed. The risks                            and benefits of the procedure and the sedation                            options and risks were discussed with the patient.                            All questions were answered, and informed consent                            was obtained. Prior Anticoagulants: The patient has                            taken Xarelto (rivaroxaban), last dose was 7 days                            prior to procedure. ASA Grade Assessment: III - A                            patient with severe systemic disease. After  reviewing the risks and benefits, the patient was                            deemed in satisfactory condition to undergo the                            procedure.                           After obtaining informed consent, the colonoscope                            was passed under direct vision. Throughout the                            procedure, the patient's  blood pressure, pulse, and                            oxygen saturations were monitored continuously. The                            PCF-HQ190L ([id number]) Olympus colonoscope was                            introduced through the anus and advanced to the the                            cecum, identified by appendiceal orifice and                            ileocecal valve. The ileocecal valve, appendiceal                            orifice, and rectum were photographed. The entire                            colon was visualized. Scope In: 1:54:16 PM Scope Out: 2:14:01 PM Scope Withdrawal Time: 0 hours 12 minutes 10 seconds  Total Procedure Duration: 0 hours 19 minutes 45 seconds  Findings:      Hemorrhoids were found on perianal exam.      A few small and large-mouthed diverticula were found in the sigmoid       colon and descending colon.      Colon otherwise normal; no other polyps, masses, vascular ectasias, or       inflammatory changes were seen.      No old or fresh blood was seen to the extent of our examination.      Very diminutive rectal vault; unable to retroflex; no obvious anorectal       pathology seen upon slow withdrawal of colonoscope in prograde position. Impression:               - Hemorrhoids found on perianal exam.                           - Diverticulosis in the sigmoid colon and in the  descending colon.                           - The examination was otherwise normal.                           - No bleeding seen; suspect bleeding was from                            diverticular bleeding that has subsequently stopped. Recommendation:           - Return patient to hospital ward for ongoing care.                           - Soft diet today.                           - Continue present medications.                           - Would resume Xarelto in 3 days, if clinically                            feasible.                           Sadie Haber  GI will sign-off; we can arrange outpatient                            follow-up with [place]; please call with any questions;                            thank you for the consultation. Procedure Code(s):        --- Professional ---                           [phone], Colonoscopy, flexible; diagnostic, including                            collection of specimen(s) by brushing or washing,                            when performed (separate procedure) Diagnosis Code(s):        --- Professional ---                           K64.9, Unspecified hemorrhoids                           K92.1, Melena (includes Hematochezia)                           D62, Acute posthemorrhagic anemia                           K57.30, Diverticulosis of large intestine without  perforation or abscess without bleeding CPT copyright 2019 [employer]. All rights reserved. The codes documented in this report are preliminary and upon coder review may  be revised to meet current compliance requirements. Arta Silence, MD 12/20/2020 2:30:13 PM This report has been signed electronically. Number of Addenda: 0

## 2020-12-20 NOTE — Anesthesia Preprocedure Evaluation (Addendum)
Anesthesia Evaluation  Patient identified by MRN, date of birth, ID band Patient awake    Reviewed: Allergy & Precautions, H&P , NPO status , Patient's Chart, lab work & pertinent test results  Airway Mallampati: III  TM Distance: >3 FB Neck ROM: Full    Dental no notable dental hx. (+) Teeth Intact, Dental Advisory Given   Pulmonary neg pulmonary ROS,    Pulmonary exam normal breath sounds clear to auscultation       Cardiovascular hypertension, Pt. on medications and Pt. on home beta blockers +CHF   Rhythm:Regular Rate:Normal     Neuro/Psych negative neurological ROS  negative psych ROS   GI/Hepatic negative GI ROS, Neg liver ROS, GERD  ,  Endo/Other  Hypothyroidism   Renal/GU negative Renal ROS  negative genitourinary   Musculoskeletal   Abdominal   Peds  Hematology negative hematology ROS (+)   Anesthesia Other Findings   Reproductive/Obstetrics negative OB ROS                            Anesthesia Physical Anesthesia Plan  ASA: 3  Anesthesia Plan: MAC   Post-op Pain Management:    Induction: Intravenous  PONV Risk Score and Plan: 2 and Propofol infusion and Treatment may vary due to age or medical condition  Airway Management Planned: Natural Airway and Simple Face Mask  Additional Equipment:   Intra-op Plan:   Post-operative Plan:   Informed Consent: I have reviewed the patients History and Physical, chart, labs and discussed the procedure including the risks, benefits and alternatives for the proposed anesthesia with the patient or authorized representative who has indicated his/her understanding and acceptance.     Dental advisory given  Plan Discussed with: CRNA  Anesthesia Plan Comments:         Anesthesia Quick Evaluation

## 2020-12-20 NOTE — Progress Notes (Signed)
PROGRESS NOTE    Lindsey Huber  ZOX:[id number] DOB: 10-25-1945 DOA: 12/13/2020 PCP: Collene Mares, PA    Brief Narrative:  75/F chronically ill with chronic diastolic CHF, valvular heart disease s/p bioprosthetic AVR, mitral valve annuloplasty, bioprosthetic TVR, paroxysmal atrial fibrillation on Eliquis, history of VT status post AICD, history of lupus, interstitial lung disease, pulmonary hypertension -Was admitted with hematochezia and severe acute blood loss anemia, hemoglobin of 6  -CT angiography noted acute gastrointestinal hemorrhage involving the mid descending colon likely representing an active diverticular hemorrhage.   -She was transfused 2 units of PRBC, anticoagulation was held and received Kcentra for reversal -seen by gastroenterology and and interventional radiology in consultation  -IR was reconsulted 11/22 for ongoing bleeding  Assessment & Plan:     Acute diverticular bleeding  Acute blood loss anemia -Transfused 2 units of PRBC this admission -CTA 11/19 noted acute GI hemorrhage involving mid descending colon, likely acute diverticular hge -Bleeding has continued to wax and wane, recurrent bleeding persists, not profuse anymore -IR was reconsulted, nuclear medicine scan was negative -Multiple episodes of bleeding with clots 11/24, gastroenterology following, started prep for colonoscopy yesterday, not clear yet -Eliquis on hold -Bump in his hemoglobin today, suspect lab error will repeat  Chronic diastolic CHF Valvular heart disease, s/p bioprosthetic AVR, TVR, mitral valve ring Pulmonary hypertension -Last echo 2/22 noted EF of 55-60%, moderate to severe TR, moderate PAH, moderate MR -Followed by heart failure clinic -Clinically euvolemic at this time    Paroxysmal atrial fibrillation, atrial flutter Patient has remained on paced rhythm. -Eliquis on hold as above   Hyponatremia, hypokalemia, hypomagnesemia and hypocalcemia/ non anion gap metabolic  acidosis -Improved, repleted   SLE Interstitial lung disease No signs of acute exacerbation.  -Followed by [name] Continue with Nintedanib and hydroxychloroquine    Secondary adrenal insufficiency.  No acute decompensation, continue with oral prednisone at her home dose.     Hypothyroid.  Continue with levothyroxine     HTN  -Stable, continue metoprolol  Gout.  -continue with allopurinol.   DVT prophylaxis: Scd   Code Status:   full  Family Communication: Discussed patient in detail, no family at bedside  Status is: Inpatient  Remains inpatient appropriate because: Severity of illness  Consultants:  GI   Procedures:   IR     Subjective: -Started colonoscopy prep yesterday evening, had 1 episode of loose stool this morning  Objective: Vitals:   12/19/20 1212 12/19/20 2017 12/20/20 0004 12/20/20 0851  BP: (!) 131/58 (!) 118/51 (!) 146/89 (!) 117/49  Pulse: 70 70 70 70  Resp: (!) 21   14  Temp:  97.6 F (36.4 C) 97.7 F (36.5 C)   TempSrc:  Oral Oral   SpO2: 94% 90% 99% 100%  Weight:      Height:       No intake or output data in the 24 hours ending 12/20/20 1128  Filed Weights   12/13/20 2218 12/14/20 1401  Weight: 53.5 kg 53.7 kg    Examination:   General: Pleasant chronically ill female sitting up in bed, AAOx3, no distress HEENT: No JVD CVS: S1-S2, regular rate rhythm Lungs: Fine diffuse bilateral crackles Abdomen: Soft, nontender, bowel sounds present Extremities: No edema  Skin: no new rashes on exposed skin   Data Reviewed: I have personally reviewed following labs and imaging studies  CBC: Recent Labs  Lab 12/13/20 1846 12/13/20 1928 12/17/20 0218 12/17/20 1523 12/18/20 0148 12/19/20 0212 12/20/20 0400  WBC 5.5   < >  6.5 10.1 5.9 5.9 6.5  NEUTROABS 2.7  --   --   --   --   --   --   HGB 6.0*   < > 8.2* 8.9* 8.2* 7.8* 8.9*  HCT 19.2*   < > 24.2* 27.2* 24.9* 24.0* 26.8*  MCV 104.9*   < > 96.0 97.8 96.5 98.4 98.2  PLT 91*    < > 98* 119* 116* 118* 134*   < > = values in this interval not displayed.   Basic Metabolic Panel: Recent Labs  Lab 12/13/20 2133 12/14/20 1432 12/15/20 0330 12/17/20 0218 12/18/20 0148 12/19/20 0212 12/20/20 0155  NA  --    < > 134* 138 135 137 141  K  --    < > 4.1 3.8 4.1 4.2 4.2  CL  --    < > 107 108 103 103 109  CO2  --    < > 21* 26 25 29 25   GLUCOSE  --    < > 83 66* 54* 117* 88  BUN  --    < > 18 9 11 16 8   CREATININE  --    < > 0.78 0.81 0.90 1.14* 0.80  CALCIUM  --    < > 8.0* 8.5* 8.2* 8.3* 8.7*  MG 1.6*  --   --  1.9  --   --   --    < > = values in this interval not displayed.   GFR: Estimated Creatinine Clearance: 49.5 mL/min (by C-G formula based on SCr of 0.8 mg/dL). Liver Function Tests: Recent Labs  Lab 12/13/20 1846  AST 26  ALT 17  ALKPHOS 27*  BILITOT 0.6  PROT 4.4*  ALBUMIN 1.8*   No results for input(s): LIPASE, AMYLASE in the last 168 hours. No results for input(s): AMMONIA in the last 168 hours. Coagulation Profile: Recent Labs  Lab 12/16/20 1130  INR 1.2   Cardiac Enzymes: No results for input(s): CKTOTAL, CKMB, CKMBINDEX, TROPONINI in the last 168 hours. BNP (last 3 results) No results for input(s): PROBNP in the last 8760 hours. HbA1C: No results for input(s): HGBA1C in the last 72 hours. CBG: Recent Labs  Lab 12/18/20 0909 12/18/20 1145 12/18/20 1348 12/18/20 1556 12/18/20 2013  GLUCAP 52* 64* 102* 131* 116*   Lipid Profile: No results for input(s): CHOL, HDL, LDLCALC, TRIG, CHOLHDL, LDLDIRECT in the last 72 hours. Thyroid Function Tests: No results for input(s): TSH, T4TOTAL, FREET4, T3FREE, THYROIDAB in the last 72 hours. Anemia Panel: No results for input(s): VITAMINB12, FOLATE, FERRITIN, TIBC, IRON, RETICCTPCT in the last 72 hours.    Radiology Studies: I have reviewed all of the imaging during this hospital visit personally  Scheduled Meds:  allopurinol  100 mg Oral q morning   cetirizine HCl  5 mg Oral  QHS   ferrous sulfate  325 mg Oral Q lunch   folic acid  1 mg Oral Q lunch   hydroxychloroquine  200 mg Oral q morning   influenza vaccine adjuvanted  0.5 mL Intramuscular Tomorrow-1000   levothyroxine  25 mcg Oral QAC breakfast   metoprolol succinate  25 mg Oral Daily   multivitamin with minerals  1 tablet Oral Q lunch   Nintedanib  150 mg Oral QHS   predniSONE  5 mg Oral Q breakfast   vitamin B-12  1,000 mcg Oral Q lunch   Continuous Infusions:  sodium chloride       LOS: 7 days    Domenic Polite,  MD   

## 2020-12-20 NOTE — Interval H&P Note (Signed)
History and Physical Interval Note:  12/20/2020 1:14 PM  Lindsey Huber  has presented today for surgery, with the diagnosis of Rectal bleeding.  The various methods of treatment have been discussed with the patient and family. After consideration of risks, benefits and other options for treatment, the patient has consented to  Procedure(s): COLONOSCOPY WITH PROPOFOL (N/A) as a surgical intervention.  The patient's history has been reviewed, patient examined, no change in status, stable for surgery.  I have reviewed the patient's chart and labs.  Questions were answered to the patient's satisfaction.     Freddy Jaksch

## 2020-12-21 ENCOUNTER — Encounter (HOSPITAL_COMMUNITY): Payer: Self-pay | Admitting: Gastroenterology

## 2020-12-21 DIAGNOSIS — K922 Gastrointestinal hemorrhage, unspecified: Secondary | ICD-10-CM | POA: Diagnosis not present

## 2020-12-21 LAB — FOLATE: Folate: 21.2 ng/mL (ref >5.9)

## 2020-12-21 LAB — BASIC METABOLIC PANEL
Anion gap: 6 (ref 5–15)
BUN: 13 mg/dL (ref 8–23)
CO2: 25 mmol/L (ref 22–32)
Calcium: 8.1 mg/dL — ABNORMAL LOW (ref 8.9–10.3)
Chloride: 106 mmol/L (ref 98–111)
Creatinine, Ser: 1.07 mg/dL — ABNORMAL HIGH (ref 0.44–1.00)
GFR, Estimated: 55 mL/min — ABNORMAL LOW (ref >60)
Glucose, Bld: 91 mg/dL (ref 70–99)
Potassium: 4.1 mmol/L (ref 3.5–5.1)
Sodium: 137 mmol/L (ref 135–145)

## 2020-12-21 LAB — CBC
HCT: 22.6 % — ABNORMAL LOW (ref 36.0–46.0)
Hemoglobin: 7.4 g/dL — ABNORMAL LOW (ref 12.0–15.0)
MCH: 32.7 pg (ref 26.0–34.0)
MCHC: 32.7 g/dL (ref 30.0–36.0)
MCV: 100 fL (ref 80.0–100.0)
Platelets: 124 10*3/uL — ABNORMAL LOW (ref 150–400)
RBC: 2.26 MIL/uL — ABNORMAL LOW (ref 3.87–5.11)
RDW: 14.8 % (ref 11.5–15.5)
WBC: 5.3 10*3/uL (ref 4.0–10.5)
nRBC: 0 % (ref 0.0–0.2)

## 2020-12-21 LAB — RETICULOCYTES
Immature Retic Fract: 20.4 % — ABNORMAL HIGH (ref 2.3–15.9)
RBC.: 2.33 MIL/uL — ABNORMAL LOW (ref 3.87–5.11)
Retic Count, Absolute: 68.7 10*3/uL (ref 19.0–186.0)
Retic Ct Pct: 3 % (ref 0.4–3.1)

## 2020-12-21 LAB — VITAMIN B12: Vitamin B-12: 1406 pg/mL — ABNORMAL HIGH (ref 180–914)

## 2020-12-21 LAB — FERRITIN: Ferritin: 76 ng/mL (ref 11–307)

## 2020-12-21 LAB — IRON AND TIBC
Iron: 48 ug/dL (ref 28–170)
Saturation Ratios: 24 % (ref 10.4–31.8)
TIBC: 197 ug/dL — ABNORMAL LOW (ref 250–450)
UIBC: 149 ug/dL

## 2020-12-21 MED ORDER — SODIUM CHLORIDE 0.9 % IV SOLN
250.0000 mg | Freq: Every day | INTRAVENOUS | Status: AC
  Administered 2020-12-21 – 2020-12-22 (×2): 250 mg via INTRAVENOUS
  Filled 2020-12-21 (×2): qty 20

## 2020-12-21 NOTE — Progress Notes (Signed)
PROGRESS NOTE    Lindsey Huber  ZOX:[id number] DOB: 12/01/1945 DOA: 12/13/2020 PCP: Collene Mares, PA    Brief Narrative:  75/F chronically ill with chronic diastolic CHF, valvular heart disease s/p bioprosthetic AVR, mitral valve annuloplasty, bioprosthetic TVR, paroxysmal atrial fibrillation on Eliquis, history of VT status post AICD, history of lupus, interstitial lung disease, pulmonary hypertension -Was admitted with hematochezia and severe acute blood loss anemia, hemoglobin of 6  -CT angiography noted acute gastrointestinal hemorrhage involving the mid descending colon likely representing an active diverticular hemorrhage.   -She was transfused 2 units of PRBC, anticoagulation was held and received Kcentra for reversal -seen by gastroenterology and and interventional radiology in consultation  -IR was reconsulted 11/22 for ongoing bleeding  Assessment & Plan:     Acute diverticular bleeding  Acute blood loss anemia -Transfused 2 units of PRBC this admission -CTA 11/19 noted acute GI hemorrhage involving mid descending colon, likely acute diverticular hge -Bleeding has continued to wax and wane, recurrent bleeding persists, not profuse anymore -IR was reconsulted, nuclear medicine scan was negative -Multiple episodes of bleeding with clots 11/24-11/25, gastroenterology following, underwent colonoscopy yesterday which noted hemorrhoids and diverticulosis, no active bleeding, recommended to restart Eliquis in 3 days if stable -Hemoglobin is 7.4 this morning , will check anemia panel, give IV iron CBC in a.m.  Chronic diastolic CHF Valvular heart disease, s/p bioprosthetic AVR, TVR, mitral valve ring Pulmonary hypertension -Last echo 2/22 noted EF of 55-60%, moderate to severe TR, moderate PAH, moderate MR -Followed by heart failure clinic -Clinically euvolemic at this time    Paroxysmal atrial fibrillation, atrial flutter Patient has remained on paced rhythm. -Eliquis  on hold as above   Hyponatremia, hypokalemia, hypomagnesemia and hypocalcemia/ non anion gap metabolic acidosis -Improved, repleted   SLE Interstitial lung disease No signs of acute exacerbation.  -Followed by [name] Continue with Nintedanib and hydroxychloroquine    Secondary adrenal insufficiency.  No acute decompensation, continue with oral prednisone at her home dose.     Hypothyroid.  Continue with levothyroxine     HTN  -Stable, continue metoprolol  Gout.  -continue with allopurinol.   DVT prophylaxis: Scd   Code Status:   full  Family Communication: Discussed patient in detail, no family at bedside Disposition: Home in 1 to 2 days Status is: Inpatient  Remains inpatient appropriate because: Severity of illness  Consultants:  GI   Procedures: Colonoscopy 11/26 noted hemorrhoids and diverticulosis in the sigmoid and descending colon, no active bleeding  IR     Subjective: -Underwent colonoscopy yesterday afternoon, this was unremarkable, no active bleeding noted   Objective: Vitals:   12/20/20 2050 12/20/20 2328 12/21/20 0400 12/21/20 0828  BP: (!) 105/47 (!) 98/43 (!) 102/47 (!) 100/56  Pulse: 70 69 69 71  Resp: 15 17 19 16   Temp: 98.2 F (36.8 C) 98 F (36.7 C) 98 F (36.7 C) 98.1 F (36.7 C)  TempSrc: Oral Oral Axillary Oral  SpO2: 100% 100% 100% 100%  Weight:   58.4 kg   Height:        Intake/Output Summary (Last 24 hours) at 12/21/2020 1140 Last data filed at 12/20/2020 2210 Gross per 24 hour  Intake 740 ml  Output 0 ml  Net 740 ml    Filed Weights   12/14/20 1401 12/20/20 1235 12/21/20 0400  Weight: 53.7 kg 53.7 kg 58.4 kg    Examination:   General: Pleasant chronically ill female sitting up in bed, AAOx3, no distress HEENT:  No JVD CVS: S1-S2, regular rate rhythm Lungs: Fine diffuse bilateral crackles Abdomen: Soft, nontender, bowel sounds present Extremities: No edema  Skin: no new rashes on exposed skin   Data  Reviewed: I have personally reviewed following labs and imaging studies  CBC: Recent Labs  Lab 12/17/20 1523 12/18/20 0148 12/19/20 0212 12/20/20 0400 12/21/20 0152  WBC 10.1 5.9 5.9 6.5 5.3  HGB 8.9* 8.2* 7.8* 8.9* 7.4*  HCT 27.2* 24.9* 24.0* 26.8* 22.6*  MCV 97.8 96.5 98.4 98.2 100.0  PLT 119* 116* 118* 134* [id number]*   Basic Metabolic Panel: Recent Labs  Lab 12/17/20 0218 12/18/20 0148 12/19/20 0212 12/20/20 0155 12/21/20 0152  NA 138 135 137 141 137  K 3.8 4.1 4.2 4.2 4.1  CL 108 103 103 109 106  CO2 26 25 29 25 25   GLUCOSE 66* 54* 117* 88 91  BUN 9 11 16 8 13   CREATININE 0.81 0.90 1.14* 0.80 1.07*  CALCIUM 8.5* 8.2* 8.3* 8.7* 8.1*  MG 1.9  --   --   --   --    GFR: Estimated Creatinine Clearance: 37 mL/min (A) (by C-G formula based on SCr of 1.07 mg/dL (H)). Liver Function Tests: No results for input(s): AST, ALT, ALKPHOS, BILITOT, PROT, ALBUMIN in the last 168 hours.  No results for input(s): LIPASE, AMYLASE in the last 168 hours. No results for input(s): AMMONIA in the last 168 hours. Coagulation Profile: Recent Labs  Lab 12/16/20 1130  INR 1.2   Cardiac Enzymes: No results for input(s): CKTOTAL, CKMB, CKMBINDEX, TROPONINI in the last 168 hours. BNP (last 3 results) No results for input(s): PROBNP in the last 8760 hours. HbA1C: No results for input(s): HGBA1C in the last 72 hours. CBG: Recent Labs  Lab 12/18/20 0909 12/18/20 1145 12/18/20 1348 12/18/20 1556 12/18/20 2013  GLUCAP 52* 64* 102* 131* 116*   Lipid Profile: No results for input(s): CHOL, HDL, LDLCALC, TRIG, CHOLHDL, LDLDIRECT in the last 72 hours. Thyroid Function Tests: No results for input(s): TSH, T4TOTAL, FREET4, T3FREE, THYROIDAB in the last 72 hours. Anemia Panel: Recent Labs    12/21/20 0753  VITAMINB12 1,406*  FOLATE 21.2  FERRITIN 76  TIBC 197*  IRON 48  RETICCTPCT 3.0   Radiology Studies: I have reviewed all of the imaging during this hospital visit  personally  Scheduled Meds:  allopurinol  100 mg Oral q morning   cetirizine HCl  5 mg Oral QHS   ferrous sulfate  325 mg Oral Q lunch   folic acid  1 mg Oral Q lunch   hydroxychloroquine  200 mg Oral q morning   influenza vaccine adjuvanted  0.5 mL Intramuscular Tomorrow-1000   levothyroxine  25 mcg Oral QAC breakfast   metoprolol succinate  25 mg Oral Daily   multivitamin with minerals  1 tablet Oral Q lunch   Nintedanib  150 mg Oral QHS   predniSONE  5 mg Oral Q breakfast   vitamin B-12  1,000 mcg Oral Q lunch   Continuous Infusions:     LOS: 8 days    Domenic Polite, MD

## 2020-12-21 NOTE — Evaluation (Signed)
Physical Therapy Evaluation Patient Details Name: Lindsey Huber MRN: [id number] DOB: 05/13/1945 Today's Date: 12/21/2020  History of Present Illness  Pt is a 75 y/o female admitted secondary to an acute GIB secondary to an acute diverticular bleed. PMH including but not limited to chronic diastolic CHF, valvular heart disease s/p bioprosthetic AVR, mitral valve annuloplasty, bioprosthetic TVR, paroxysmal atrial fibrillation on Eliquis, history of VT status post AICD, history of lupus, interstitial lung disease, pulmonary hypertension.   Clinical Impression  Pt presented sitting upright in recliner chair, awake and willing to participate in therapy session. Prior to admission, pt reported that she was independent with ADLs, walks independently within her home and utilizes a cane to ambulate within the community. Pt lives with her daughter and granddaughter in a single level home with a level entry. At the time of evaluation, pt overall moving very well at a supervision to min guard level with all functional mobility without the use of an AD, including long distance hallway ambulation. Pt reported that she feels she is at her baseline in regards to functional mobility. No further acute PT needs identified at this time. PT signing off.        Recommendations for follow up therapy are one component of a multi-disciplinary discharge planning process, led by the attending physician.  Recommendations may be updated based on patient status, additional functional criteria and insurance authorization.  Follow Up Recommendations No PT follow up    Assistance Recommended at Discharge PRN  Functional Status Assessment Patient has not had a recent decline in their functional status  Equipment Recommendations  None recommended by PT    Recommendations for Other Services       Precautions / Restrictions Precautions Precautions: Fall Restrictions Weight Bearing Restrictions: No      Mobility  Bed  Mobility               General bed mobility comments: pt OOB in recliner chair upon PT arrival    Transfers Overall transfer level: Needs assistance Equipment used: None Transfers: Sit to/from Stand Sit to Stand: Supervision           General transfer comment: for safety    Ambulation/Gait Ambulation/Gait assistance: Min guard Gait Distance (Feet): 500 Feet Assistive device: None Gait Pattern/deviations: Step-through pattern;Decreased stride length Gait velocity: decreased     General Gait Details: pt with mild instability but no overt LOB or need for physical assistance, min guard for safety  Stairs            Wheelchair Mobility    Modified Rankin (Stroke Patients Only)       Balance Overall balance assessment: Needs assistance Sitting-balance support: Feet supported Sitting balance-Leahy Scale: Good     Standing balance support: During functional activity;No upper extremity supported Standing balance-Leahy Scale: Fair                               Pertinent Vitals/Pain Pain Assessment: No/denies pain    Home Living Family/patient expects to be discharged to:: Private residence Living Arrangements: Children Available Help at Discharge: Family Type of Home: House Home Access: Level entry       Home Layout: One level Home Equipment: Conservation officer, nature (2 wheels);Cane - quad;Shower seat;Wheelchair - manual      Prior Function Prior Level of Function : Independent/Modified Independent             Mobility Comments: ambulates independently within her  home, uses a cane for community mobility       Hand Dominance        Extremity/Trunk Assessment   Upper Extremity Assessment Upper Extremity Assessment: Overall WFL for tasks assessed    Lower Extremity Assessment Lower Extremity Assessment: Overall WFL for tasks assessed       Communication   Communication: No difficulties  Cognition Arousal/Alertness:  Awake/alert Behavior During Therapy: WFL for tasks assessed/performed Overall Cognitive Status: Within Functional Limits for tasks assessed                                          General Comments      Exercises     Assessment/Plan    PT Assessment Patient does not need any further PT services  PT Problem List         PT Treatment Interventions      PT Goals (Current goals can be found in the Care Plan section)  Acute Rehab PT Goals Patient Stated Goal: "go home" PT Goal Formulation: All assessment and education complete, DC therapy    Frequency     Barriers to discharge        Co-evaluation               AM-PAC PT "6 Clicks" Mobility  Outcome Measure Help needed turning from your back to your side while in a flat bed without using bedrails?: None Help needed moving from lying on your back to sitting on the side of a flat bed without using bedrails?: None Help needed moving to and from a bed to a chair (including a wheelchair)?: None Help needed standing up from a chair using your arms (e.g., wheelchair or bedside chair)?: None Help needed to walk in hospital room?: None Help needed climbing 3-5 steps with a railing? : A Little 6 Click Score: 23    End of Session   Activity Tolerance: Patient tolerated treatment well Patient left: in chair;with call bell/phone within reach Nurse Communication: Mobility status PT Visit Diagnosis: Other abnormalities of gait and mobility (R26.89)    Time: 6568-1275 PT Time Calculation (min) (ACUTE ONLY): 17 min   Charges:   PT Evaluation $PT Eval Low Complexity: 1 Low          Ginette Pitman, PT, DPT  Acute Rehabilitation Services Office [phone]   Lindsey Huber 12/21/2020, 1:35 PM

## 2020-12-21 NOTE — Progress Notes (Deleted)
Mobility Specialist Progress Note   12/21/20 1347  Mobility  Activity Transferred to/from [facility];Transferred:  Bed to chair (Transferred to Bathroom*)  Level of Assistance Minimal assist, patient does 75% or more  Assistive Device Stedy  Mobility Sit up in bed/chair position for meals;Out of bed for toileting  Mobility Response Tolerated well  Mobility performed by Mobility specialist  Bed Position Chair  $Mobility charge 1 Mobility   Received pt in bed just receiving pain meds and needing to use BR. Min A for LE for bed mobility, Min A STS w/ stedy, able to stand for ~35min w/o complaint. Unsuccessful BM and returned to the chair w/ call bell in reach, lunch tray in front and chair alarm on.   Frederico Hamman Mobility Specialist Phone Number [phone]

## 2020-12-21 NOTE — Progress Notes (Deleted)
Office Visit Note  Patient: Lindsey Huber             Date of Birth: 1945/08/22           MRN: [id number]             PCP: Scheryl Marten, PA Referring: Scheryl Marten, [name] Visit Date: 12/22/2020   Subjective:  No chief complaint on file.   History of Present Illness: Lindsey Huber is a 75 y.o. female here for follow up for SLE with inflammatory arthritis and gout on HCQ 200 mg daily and allopurinol 100 mg daily. ***   Previous HPI 08/20/20 Lindsey Huber is a 75 y.o. female here for follow up for SLE with inflammatory arthritis and gout currently on HCQ 200 mg and allopurinol 100 mg.  She denies any significant flareup of symptoms since her last visit. She remains on Ofev 150 mg BID for ILD. She has ongoing problems with her left knee sometimes increased pain and stiffness in this joint and feeling that it gives out on her.  She reports a fall in April of this year after her left knee gave out on her also slipping in May according to our last visit.  She uses a rolling walker for stability and sometimes uses a soft knee brace. She worked with physical therapy for gait earlier this year. She saw endocrinology clinic this morning and is now restarting steroids at 5 mg prednisone daily for chronic adrenal insufficiency most likely from long-term use of steroids for rheumatology.   Previous HPI: 06/27/20 Lindsey Huber is a 75 y.o. female here to establish care for systemic lupus and gouty arthritis currently on hydroxychloroquine 200 mg p.o. daily and allopurinol 100 mg p.o. daily.  She was originally diagnosed due to symptoms of multiple joint pains with orthopedic surgery evaluation in [place] with additional work-up revealing for systemic lupus.  She has previously taken steroids and was on low-dose prednisone 5 mg daily for quite some time although discontinued at her most recent hospitalization.  She has had episodic gout flares involving both feet.  Lupus symptoms have  been predominantly arthritis possibly a overlap with rheumatoid arthritis with no known history of lupus nephritis.  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  She has also had intra-articular steroid injection of the knees for arthritis symptoms with reasonably good benefit.  Her left knee has had some frequent slipping or instability reported she describes a fall last week despite routine use of a walker and sometimes knee brace for this.   No Rheumatology ROS completed.   PMFS History:  Patient Active Problem List   Diagnosis Date Noted   Hypotension 12/14/2020   Hypokalemia 12/14/2020   Hypocalcemia 12/14/2020   Hypomagnesemia 12/14/2020   Acute GI bleeding 12/13/2020   Multinodular goiter 08/20/2020   Acquired hypothyroidism 08/20/2020   Idiopathic gout of multiple sites 06/27/2020   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   Vitamin D deficiency 05/01/2020   Mammogram abnormal 05/01/2020   Proteinuria 05/01/2020   Other bilateral secondary osteoarthritis of knee 05/01/2020   Osteoarthritis of hip 05/01/2020   HTN (hypertension) 05/01/2020   GERD without esophagitis 05/01/2020   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[place]) 05/01/2020   Cardiomyopathy ([place]) 05/01/2020   Iatrogenic hypotension 05/01/2020   Hypercoagulability due to atrial fibrillation ([place]) 05/01/2020   Systemic lupus erythematosus ([place]) 05/01/2020   Chronic heart failure with preserved ejection fraction ([place]) 05/01/2020   Mixed connective tissue disease (  [place]) 05/01/2020   Paroxysmal atrial fibrillation with RVR ([place]) 05/01/2020   Protein calorie malnutrition ([place]) 05/01/2020   Chronic kidney disease (CKD), active medical management without dialysis, stage 3 (moderate) ([place]) 05/01/2020   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[place]) 05/01/2020   Non-rheumatic atrial fibrillation ([place]) 05/01/2020   Atherosclerosis of abdominal aorta ([place])  05/01/2020   Diverticulosis of colon 05/01/2020   Cholelithiasis 05/01/2020   Secondary adrenal insufficiency ([place]) 05/01/2020   Anemia of chronic disease 05/01/2020    Past Medical History:  Diagnosis Date   A-fib ([place])    Anemia    Cardiomyopathy ([place])    CHF (congestive heart failure) ([place])    CKD (chronic kidney disease)    Diverticulosis    Hypertension    Lupus ([place])    Osteoarthritis    Pulmonary fibrosis ([place])    Vitamin D deficiency     Family History  Problem Relation Age of Onset   Heart disease Mother    Hypertension Mother    Rheum arthritis Mother    Osteoarthritis Mother    Heart disease Father    Hypertension Father    Osteoarthritis Father    Heart disease Sister    Diabetes Brother    Cancer Brother    Past Surgical History:  Procedure Laterality Date   CARDIAC VALVE SURGERY     CARPAL TUNNEL RELEASE     COLONOSCOPY WITH PROPOFOL N/A 12/20/2020   Procedure: COLONOSCOPY WITH PROPOFOL;  Surgeon: Arta Silence, MD;  Location: [facility] ENDOSCOPY;  Service: Endoscopy;  Laterality: N/A;   HEMORRHOID SURGERY     PACEMAKER INSERTION     RIGHT/LEFT HEART CATH AND CORONARY ANGIOGRAPHY N/A 11/27/2020   Procedure: RIGHT/LEFT HEART CATH AND CORONARY ANGIOGRAPHY;  Surgeon: Jolaine Artist, MD;  Location: [facility];  Service: Cardiovascular;  Laterality: N/A;   TONSILLECTOMY     Social History   Social History Narrative   Not on file   Immunization History  Administered Date(s) Administered   Influenza-Unspecified 10/26/2019   PFIZER(Purple Top)SARS-COV-2 Vaccination 05/08/2019     Objective: Vital Signs: There were no vitals taken for this visit.   Physical Exam   Musculoskeletal Exam: ***  CDAI Exam: CDAI Score: -- Patient Global: --; Provider Global: -- Swollen: --; Tender: -- Joint Exam 12/22/2020   No joint exam has been documented for this visit   There is currently no information documented on the homunculus. Go to the  Rheumatology activity and complete the homunculus joint exam.  Investigation: No additional findings.  Imaging: NM GI Blood Loss  Result Date: 12/17/2020 CLINICAL DATA:  Gastrointestinal bleeding, previous abnormal CT angiography demonstrating bleeding in the proximal descending colon, last episode of bleeding this morning at 8 a.m. EXAM: NUCLEAR MEDICINE GASTROINTESTINAL BLEEDING SCAN TECHNIQUE: Sequential abdominal images were obtained following intravenous administration of Tc-32m labeled red blood cells. RADIOPHARMACEUTICALS:  24.6 mCi Tc-38m pertechnetate in-vitro labeled red cells. COMPARISON:  12/13/2020 FINDINGS: Anterior planar images of the abdomen and pelvis are obtained for 2 hours after radiotracer administration. There is normal distribution of radiotracer throughout the vascular structures, liver, and heart. A small amount of excreted tracer is seen filling the bladder lumen. There is no abnormal accumulation of radiotracer to suggest active gastrointestinal bleeding. Specifically, no radiotracer accumulation within the region of the descending colon at the site of prior hemorrhage seen on CT angiography. IMPRESSION: 1. No evidence of active gastrointestinal bleeding. Electronically Signed   By: Randa Ngo M.D.   On:  12/17/2020 16:07   CARDIAC CATHETERIZATION  Result Date: 11/27/2020   The left ventricular ejection fraction is 55-65% by visual estimate. Findings: Ao =129/63 (89) LV = 141/8 RA = 11 (v waves to 14) RV = 43/4 PA = 42/15 (25) PCW = 10 Fick cardiac output/index = 5.7/3.7 PVR = 2.1 WU SVR = 1101 PAPi = 2.45 WU Ao sat = 96% PA sat =  74%, 75% SVC sat = 82% MVA (LV-wedge tracing) = 3.5cm2 mean gradient across MV 3.59mmHG Assessment: 1. Normal coronary arteries 2. EF 60-65% 3. Very mild PAH 4. Normal left-sided filling pressures with no significant v-waves in PCWP tracing to suggest hemodynamically significant MR 5. No significant stenosis across AoV, MV or TV Plan/Discussion:  Medical therapy Glori Bickers, MD 1:17 PM  DG Chest Portable 1 View  Result Date: 12/13/2020 CLINICAL DATA:  Shortness of breath EXAM: PORTABLE CHEST 1 VIEW COMPARISON:  05/12/2020 FINDINGS: Cardiac shadow is enlarged. Postsurgical changes and defibrillator are again seen and stable. Mild vascular congestion is again noted. No focal infiltrate or effusion is seen. No bony abnormality is noted. IMPRESSION: Mild vascular congestion stable from the prior exam. Electronically Signed   By: Inez Catalina M.D.   On: 12/13/2020 19:54   CT ANGIO GI BLEED  Result Date: 12/13/2020 CLINICAL DATA:  Bright red blood per rectum, hypertension EXAM: CTA ABDOMEN AND PELVIS WITHOUT AND WITH CONTRAST TECHNIQUE: Multidetector CT imaging of the abdomen and pelvis was performed using the standard protocol during bolus administration of intravenous contrast. Multiplanar reconstructed images and MIPs were obtained and reviewed to evaluate the vascular anatomy. CONTRAST:  119mL OMNIPAQUE IOHEXOL 350 MG/ML SOLN COMPARISON:  06/30/2020 FINDINGS: VASCULAR Aorta: Minimal atherosclerotic calcification. Normal caliber; no aneurysm or dissection. No periaortic inflammatory change. Celiac: Patent without evidence of aneurysm, dissection, vasculitis or significant stenosis. SMA: Patent without evidence of aneurysm, dissection, vasculitis or significant stenosis. Renals: Single right and dual left renal arteries are widely patent demonstrate normal vascular morphology. No aneurysm or dissection. IMA: Patent without evidence of aneurysm, dissection, vasculitis or significant stenosis. Inflow: Patent without evidence of aneurysm, dissection, vasculitis or significant stenosis. Proximal Outflow: Bilateral common femoral and visualized portions of the superficial and profunda femoral arteries are patent without evidence of aneurysm, dissection, vasculitis or significant stenosis. Veins: Unremarkable Review of the MIP images confirms the above  findings. NON-VASCULAR Lower chest: Ground-glass pulmonary infiltrate with associated intra and interlobular septal thickening and traction bronchiectasis in keeping with pulmonary fibrosis again noted at the visualized lung bases, similar to prior examination. Tricuspid, mitral, and possible aortic valve replacement has been performed. Pacemaker leads are seen within the right atrium and right ventricle. Moderate cardiomegaly with biatrial enlargement. Reflux of contrast into the hepatic venous system is in keeping with at least some degree of right heart failure or tricuspid regurgitation. Hepatobiliary: Cholelithiasis without pericholecystic inflammatory change. Gallbladder unremarkable. No intra or extrahepatic biliary ductal dilation. Pancreas: Unremarkable Spleen: Unremarkable Adrenals/Urinary Tract: Adrenal glands are unremarkable. Kidneys are normal, without renal calculi, focal lesion, or hydronephrosis. Bladder is unremarkable. Stomach/Bowel: There is active gastrointestinal hemorrhage involving the mid descending colon, best seen on axial image # 62/6 and coronal image # 96/11. The underlying etiology of the hemorrhage is not clearly identified, however, this most likely represents a small diverticular hemorrhage. There is background moderate sigmoid diverticulosis. The stomach, small bowel, and large bowel are otherwise unremarkable. The appendix is not clearly identified and is likely absent. Lymphatic: No pathologic adenopathy within the abdomen and pelvis. Reproductive:  Uterus and bilateral adnexa are unremarkable. Other: No abdominal wall hernia.  The rectum is unremarkable. Musculoskeletal: There is grade 2 anterolisthesis of L4-5 with superimposed advanced degenerative changes. Resultant marked central canal stenosis best seen on axial image # 70/6. Superior endplate fracture of L3 with approximately 10-20% loss of height noted. Osseous structures are diffusely osteopenic. No acute bone abnormality  identified. No lytic or blastic bone lesions. IMPRESSION: VASCULAR No acute findings. NON-VASCULAR Active gastrointestinal hemorrhage involving the mid descending colon likely representing an active diverticular hemorrhage. Stable bibasilar pulmonary fibrotic change. Moderate cardiomegaly with biatrial enlargement and at least some degree of right heart failure or tricuspid regurgitation. Cholelithiasis. Moderate sigmoid diverticulosis. Grade 2 anterolisthesis L4-5 with superimposed advanced degenerative change. Resultant marked central canal stenosis. These results were called by telephone at the time of interpretation on 12/13/2020 at 9:52 pm to provider [facility] , who verbally acknowledged these results. Electronically Signed   By: Helyn Numbers M.D.   On: 12/13/2020 21:54    Recent Labs: Lab Results  Component Value Date   WBC 5.3 12/21/2020   HGB 7.4 (L) 12/21/2020   PLT 124 (L) 12/21/2020   NA 137 12/21/2020   K 4.1 12/21/2020   CL 106 12/21/2020   CO2 25 12/21/2020   GLUCOSE 91 12/21/2020   BUN 13 12/21/2020   CREATININE 1.07 (H) 12/21/2020   BILITOT 0.6 12/13/2020   ALKPHOS 27 (L) 12/13/2020   AST 26 12/13/2020   ALT 17 12/13/2020   PROT 4.4 (L) 12/13/2020   ALBUMIN 1.8 (L) 12/13/2020   CALCIUM 8.1 (L) 12/21/2020    Speciality Comments: PLQ Eye Exam- 09/19/2020, nothing abnormal related to PLQ F/u in 12 months  Procedures:  No procedures performed Allergies: Propofol, Flecainide, Amiodarone, and Amlodipine   Assessment / Plan:     Visit Diagnoses: No diagnosis found.  ***  Orders: No orders of the defined types were placed in this encounter.  No orders of the defined types were placed in this encounter.    Follow-Up Instructions: No follow-ups on file.   Fuller Plan, MD  Note - This record has been created using [employer].  Chart creation errors have been sought, but may not always  have been located. Such creation errors do not reflect  on  the standard of medical care.

## 2020-12-21 NOTE — Progress Notes (Signed)
Mobility Specialist Progress Note   12/21/20 1150  Mobility  Activity Ambulated in hall  Level of Assistance Modified independent, requires aide device or extra time  Assistive Device Front wheel walker  Distance Ambulated (ft) 775 ft  Mobility Ambulated independently in hallway  Mobility Response Tolerated well  Mobility performed by Mobility specialist  Bed Position Chair  $Mobility charge 1 Mobility   Received pt in chair having no complaints and agreeable to mobility. Asymptomatic throughout ambulation, returned back to chair w/ call bell by side and all needs met.  Holland Falling Mobility Specialist Phone Number [phone]

## 2020-12-22 ENCOUNTER — Ambulatory Visit: Payer: Medicare HMO | Admitting: Internal Medicine

## 2020-12-22 DIAGNOSIS — K922 Gastrointestinal hemorrhage, unspecified: Secondary | ICD-10-CM | POA: Diagnosis not present

## 2020-12-22 LAB — CBC
HCT: 22.6 % — ABNORMAL LOW (ref 36.0–46.0)
Hemoglobin: 7.4 g/dL — ABNORMAL LOW (ref 12.0–15.0)
MCH: 32 pg (ref 26.0–34.0)
MCHC: 32.7 g/dL (ref 30.0–36.0)
MCV: 97.8 fL (ref 80.0–100.0)
Platelets: 130 10*3/uL — ABNORMAL LOW (ref 150–400)
RBC: 2.31 MIL/uL — ABNORMAL LOW (ref 3.87–5.11)
RDW: 14.8 % (ref 11.5–15.5)
WBC: 5.1 10*3/uL (ref 4.0–10.5)
nRBC: 0 % (ref 0.0–0.2)

## 2020-12-22 LAB — BASIC METABOLIC PANEL
Anion gap: 6 (ref 5–15)
BUN: 12 mg/dL (ref 8–23)
CO2: 26 mmol/L (ref 22–32)
Calcium: 8.4 mg/dL — ABNORMAL LOW (ref 8.9–10.3)
Chloride: 107 mmol/L (ref 98–111)
Creatinine, Ser: 1.07 mg/dL — ABNORMAL HIGH (ref 0.44–1.00)
GFR, Estimated: 55 mL/min — ABNORMAL LOW (ref >60)
Glucose, Bld: 91 mg/dL (ref 70–99)
Potassium: 4 mmol/L (ref 3.5–5.1)
Sodium: 139 mmol/L (ref 135–145)

## 2020-12-22 LAB — PREPARE RBC (CROSSMATCH)

## 2020-12-22 MED ORDER — SODIUM CHLORIDE 0.9% IV SOLUTION: Freq: Once | INTRAVENOUS | Status: AC

## 2020-12-22 MED ORDER — FUROSEMIDE 20 MG PO TABS
20.0000 mg | ORAL_TABLET | Freq: Every day | ORAL | Status: DC
  Administered 2020-12-22 – 2020-12-23 (×2): 20 mg via ORAL
  Filled 2020-12-22 (×2): qty 1

## 2020-12-22 NOTE — Progress Notes (Signed)
Mobility Specialist Progress Note   12/22/20 1513  Mobility  Activity Ambulated in hall  Level of Assistance Standby assist, set-up cues, supervision of patient - no hands on  Assistive Device Front wheel walker  Distance Ambulated (ft) 550 ft  Mobility Ambulated independently in hallway  Mobility Response Tolerated well  Mobility performed by Mobility specialist  $Mobility charge 1 Mobility   Received pt in bed having no complaints and agreeable to mobility. Asymptomatic throughout ambulation, returned back to bed w/ call bell by side and all needs met.  Holland Falling Mobility Specialist Phone Number [phone]

## 2020-12-22 NOTE — Progress Notes (Addendum)
PROGRESS NOTE    Lindsey Huber  [id number] DOB: [birth year] DOA: 12/13/2020 PCP: Scheryl Marten, PA    Brief Narrative:  75/F chronically ill with chronic diastolic CHF, valvular heart disease s/p bioprosthetic AVR, mitral valve annuloplasty, bioprosthetic TVR, paroxysmal atrial fibrillation on Eliquis, history of VT status post AICD, history of lupus, interstitial lung disease, pulmonary hypertension -Was admitted with hematochezia and severe acute blood loss anemia, hemoglobin of 6  -CT angiography noted acute gastrointestinal hemorrhage involving the mid descending colon likely representing an active diverticular hemorrhage.   -She was transfused 2 units of PRBC, anticoagulation was held and received Kcentra for reversal -seen by gastroenterology and and interventional radiology in consultation  -IR was reconsulted 11/22 for ongoing bleeding  Assessment & Plan:   Acute diverticular bleeding  Acute blood loss anemia -Transfused 2 units of PRBC this admission -CTA 11/19 noted acute GI hemorrhage involving mid descending colon, likely acute diverticular hge -Bleeding has continued to wax and wane, recurrent bleeding persists, not profuse anymore -IR was reconsulted, nuclear medicine scan was negative -Multiple episodes of bleeding with clots last week and 11/24-11/25, gastroenterology following, underwent colonoscopy 11/26 which noted hemorrhoids and diverticulosis, no active bleeding, recommended to restart Eliquis in 3 days if stable -Hemoglobin is stable at 7.4, will transfuse 1 unit of PRBC given multitude of cardiac issues including CHF, pulmonary hypertension, also giving IV iron  -CBC in a.m  Chronic diastolic CHF Valvular heart disease, s/p bioprosthetic AVR, TVR, mitral valve ring Pulmonary hypertension -Last echo 2/22 noted EF of 55-60%, moderate to severe TR, moderate PAH, moderate MR -Followed by heart failure clinic -Clinically euvolemic at this time -Resume  p.o. Lasix    Paroxysmal atrial fibrillation, atrial flutter Patient has remained on paced rhythm. -Eliquis on hold as above   Hyponatremia, hypokalemia, hypomagnesemia and hypocalcemia/ non anion gap metabolic acidosis -Improved, repleted   SLE Interstitial lung disease No signs of acute exacerbation.  -Followed by Dr. Silas Flood Continue with Nintedanib and hydroxychloroquine    Secondary adrenal insufficiency.  No acute decompensation, continue with oral prednisone at her home dose.     Hypothyroid.  Continue with levothyroxine     HTN  -Stable, continue metoprolol  Gout.  -continue with allopurinol.   DVT prophylaxis: SCDs  Code Status:   full  Family Communication: Discussed patient in detail, no family at bedside Disposition: Home tomorrow if stable Status is: Inpatient  Remains inpatient appropriate because: Severity of illness  Consultants:  GI   Procedures: Colonoscopy 11/26 noted hemorrhoids and diverticulosis in the sigmoid and descending colon, no active bleeding  IR     Subjective: -Feels okay, some dyspnea with exertion, not too different from baseline  Objective: Vitals:   12/21/20 2105 12/21/20 2342 12/22/20 0355 12/22/20 0819  BP: 132/62 (!) 102/46 (!) 113/54 (!) 121/52  Pulse: 71 73 70 71  Resp: 15 16 20 18   Temp: 97.8 F (36.6 C) 97.7 F (36.5 C) 97.6 F (36.4 C) 98.1 F (36.7 C)  TempSrc:  Oral Oral Oral  SpO2: 100% 98% 99% 100%  Weight:      Height:        Intake/Output Summary (Last 24 hours) at 12/22/2020 1214 Last data filed at 12/21/2020 1824 Gross per 24 hour  Intake 480 ml  Output --  Net 480 ml    Filed Weights   12/14/20 1401 12/20/20 1235 12/21/20 0400  Weight: 53.7 kg 53.7 kg 58.4 kg    Examination:   General: Pleasant chronically ill  female sitting up in bed, AAOx3, no distress HEENT: No JVD CVS: S1-S2, regular rate rhythm Lungs: Diffuse bilateral crackles Abdomen: Soft, nontender, bowel sounds  present Extremities: No edema Skin: no new rashes on exposed skin   Data Reviewed: I have personally reviewed following labs and imaging studies  CBC: Recent Labs  Lab 12/18/20 0148 12/19/20 0212 12/20/20 0400 12/21/20 0152 12/22/20 0333  WBC 5.9 5.9 6.5 5.3 5.1  HGB 8.2* 7.8* 8.9* 7.4* 7.4*  HCT 24.9* 24.0* 26.8* 22.6* 22.6*  MCV 96.5 98.4 98.2 100.0 97.8  PLT 116* 118* 134* 124* 130*   Basic Metabolic Panel: Recent Labs  Lab 12/17/20 0218 12/18/20 0148 12/19/20 0212 12/20/20 0155 12/21/20 0152 12/22/20 0333  NA 138 135 137 141 137 139  K 3.8 4.1 4.2 4.2 4.1 4.0  CL 108 103 103 109 106 107  CO2 26 25 29 25 25 26   GLUCOSE 66* 54* 117* 88 91 91  BUN 9 11 16 8 13 12   CREATININE 0.81 0.90 1.14* 0.80 1.07* 1.07*  CALCIUM 8.5* 8.2* 8.3* 8.7* 8.1* 8.4*  MG 1.9  --   --   --   --   --    GFR: Estimated Creatinine Clearance: 37 mL/min (A) (by C-G formula based on SCr of 1.07 mg/dL (H)). Liver Function Tests: No results for input(s): AST, ALT, ALKPHOS, BILITOT, PROT, ALBUMIN in the last 168 hours.  No results for input(s): LIPASE, AMYLASE in the last 168 hours. No results for input(s): AMMONIA in the last 168 hours. Coagulation Profile: Recent Labs  Lab 12/16/20 1130  INR 1.2   Cardiac Enzymes: No results for input(s): CKTOTAL, CKMB, CKMBINDEX, TROPONINI in the last 168 hours. BNP (last 3 results) No results for input(s): PROBNP in the last 8760 hours. HbA1C: No results for input(s): HGBA1C in the last 72 hours. CBG: Recent Labs  Lab 12/18/20 0909 12/18/20 1145 12/18/20 1348 12/18/20 1556 12/18/20 2013  GLUCAP 52* 64* 102* 131* 116*   Lipid Profile: No results for input(s): CHOL, HDL, LDLCALC, TRIG, CHOLHDL, LDLDIRECT in the last 72 hours. Thyroid Function Tests: No results for input(s): TSH, T4TOTAL, FREET4, T3FREE, THYROIDAB in the last 72 hours. Anemia Panel: Recent Labs    12/21/20 0753  VITAMINB12 1,406*  FOLATE 21.2  FERRITIN 76  TIBC 197*   IRON 48  RETICCTPCT 3.0   Radiology Studies: I have reviewed all of the imaging during this hospital visit personally  Scheduled Meds:  sodium chloride   Intravenous Once   allopurinol  100 mg Oral q morning   cetirizine HCl  5 mg Oral QHS   ferrous sulfate  325 mg Oral Q lunch   folic acid  1 mg Oral Q lunch   furosemide  20 mg Oral Daily   hydroxychloroquine  200 mg Oral q morning   influenza vaccine adjuvanted  0.5 mL Intramuscular Tomorrow-1000   levothyroxine  25 mcg Oral QAC breakfast   metoprolol succinate  25 mg Oral Daily   multivitamin with minerals  1 tablet Oral Q lunch   Nintedanib  150 mg Oral QHS   predniSONE  5 mg Oral Q breakfast   vitamin B-12  1,000 mcg Oral Q lunch   Continuous Infusions:     LOS: 9 days    12/20/20, MD

## 2020-12-22 NOTE — Care Management Important Message (Signed)
Important Message  Patient Details  Name: Lindsey Huber MRN: [id number] Date of Birth: 1945-09-03   Medicare Important Message Given:  Yes     Wadie Lessen 12/22/2020, 2:52 PM

## 2020-12-23 ENCOUNTER — Ambulatory Visit: Payer: Medicare HMO | Admitting: Physical Therapy

## 2020-12-23 DIAGNOSIS — K922 Gastrointestinal hemorrhage, unspecified: Secondary | ICD-10-CM | POA: Diagnosis not present

## 2020-12-23 LAB — BASIC METABOLIC PANEL
Anion gap: 6 (ref 5–15)
BUN: 13 mg/dL (ref 8–23)
CO2: 28 mmol/L (ref 22–32)
Calcium: 8.6 mg/dL — ABNORMAL LOW (ref 8.9–10.3)
Chloride: 106 mmol/L (ref 98–111)
Creatinine, Ser: 1.18 mg/dL — ABNORMAL HIGH (ref 0.44–1.00)
GFR, Estimated: 49 mL/min — ABNORMAL LOW (ref >60)
Glucose, Bld: 87 mg/dL (ref 70–99)
Potassium: 3.9 mmol/L (ref 3.5–5.1)
Sodium: 140 mmol/L (ref 135–145)

## 2020-12-23 LAB — CBC
HCT: 26.1 % — ABNORMAL LOW (ref 36.0–46.0)
Hemoglobin: 8.6 g/dL — ABNORMAL LOW (ref 12.0–15.0)
MCH: 31.7 pg (ref 26.0–34.0)
MCHC: 33 g/dL (ref 30.0–36.0)
MCV: 96.3 fL (ref 80.0–100.0)
Platelets: 131 10*3/uL — ABNORMAL LOW (ref 150–400)
RBC: 2.71 MIL/uL — ABNORMAL LOW (ref 3.87–5.11)
RDW: 15.8 % — ABNORMAL HIGH (ref 11.5–15.5)
WBC: 5.9 10*3/uL (ref 4.0–10.5)
nRBC: 0.5 % — ABNORMAL HIGH (ref 0.0–0.2)

## 2020-12-23 LAB — TYPE AND SCREEN
ABO/RH(D): A NEG
Antibody Screen: NEGATIVE
Unit division: 0

## 2020-12-23 LAB — BPAM RBC
Blood Product Expiration Date: 202212162359
ISSUE DATE / TIME: 202211281620
Unit Type and Rh: 6200

## 2020-12-23 MED ORDER — RIVAROXABAN 20 MG PO TABS: 20.0000 mg | ORAL_TABLET | Freq: Every day | ORAL | Status: DC

## 2020-12-23 MED ORDER — RIVAROXABAN 15 MG PO TABS: 15.0000 mg | ORAL_TABLET | Freq: Every day | ORAL | 0 refills | Status: DC

## 2020-12-23 NOTE — Progress Notes (Signed)
Patient given discharge instructions and stated understanding. 

## 2020-12-23 NOTE — Progress Notes (Signed)
Mobility Specialist Progress Note   12/23/20 1045  Mobility  Activity Ambulated in hall  Level of Assistance Independent after set-up  Assistive Device Front wheel walker  Distance Ambulated (ft) 550 ft  Mobility Ambulated independently in hallway  Mobility Response Tolerated well  Mobility performed by Mobility specialist  Bed Position Chair  $Mobility charge 1 Mobility   Received pt in chair having no complaints and agreeable to mobility. Asymptomatic throughout ambulation, returned back to chair w/ call bell by side and all needs met.  Holland Falling Mobility Specialist Phone Number [phone]

## 2020-12-24 ENCOUNTER — Encounter: Payer: Medicare HMO | Admitting: Physical Therapy

## 2020-12-25 ENCOUNTER — Ambulatory Visit: Payer: Medicare HMO | Admitting: Physical Therapy

## 2020-12-29 ENCOUNTER — Ambulatory Visit (HOSPITAL_COMMUNITY)
Admission: RE | Admit: 2020-12-29 | Discharge: 2020-12-29 | Disposition: A | Discharge status: Home / Self Care | Payer: Medicare HMO | Source: Ambulatory Visit | Attending: Internal Medicine | Admitting: Internal Medicine

## 2020-12-29 ENCOUNTER — Encounter (HOSPITAL_COMMUNITY): Payer: Self-pay | Admitting: Internal Medicine

## 2020-12-29 ENCOUNTER — Ambulatory Visit: Payer: Medicare HMO | Admitting: Physical Therapy

## 2020-12-29 VITALS — BP 150/90 | HR 68 | Wt 114.0 lb

## 2020-12-29 DIAGNOSIS — Z95810 Presence of automatic (implantable) cardiac defibrillator: Secondary | ICD-10-CM | POA: Insufficient documentation

## 2020-12-29 DIAGNOSIS — I4892 Unspecified atrial flutter: Secondary | ICD-10-CM | POA: Diagnosis not present

## 2020-12-29 DIAGNOSIS — Z7952 Long term (current) use of systemic steroids: Secondary | ICD-10-CM | POA: Diagnosis not present

## 2020-12-29 DIAGNOSIS — I4821 Permanent atrial fibrillation: Secondary | ICD-10-CM | POA: Diagnosis not present

## 2020-12-29 DIAGNOSIS — I340 Nonrheumatic mitral (valve) insufficiency: Secondary | ICD-10-CM | POA: Diagnosis not present

## 2020-12-29 DIAGNOSIS — M3214 Glomerular disease in systemic lupus erythematosus: Secondary | ICD-10-CM | POA: Insufficient documentation

## 2020-12-29 DIAGNOSIS — Z953 Presence of xenogenic heart valve: Secondary | ICD-10-CM | POA: Insufficient documentation

## 2020-12-29 DIAGNOSIS — I272 Pulmonary hypertension, unspecified: Secondary | ICD-10-CM | POA: Diagnosis not present

## 2020-12-29 DIAGNOSIS — I5032 Chronic diastolic (congestive) heart failure: Secondary | ICD-10-CM | POA: Insufficient documentation

## 2020-12-29 DIAGNOSIS — I495 Sick sinus syndrome: Secondary | ICD-10-CM | POA: Diagnosis not present

## 2020-12-29 DIAGNOSIS — N189 Chronic kidney disease, unspecified: Secondary | ICD-10-CM | POA: Insufficient documentation

## 2020-12-29 DIAGNOSIS — Z7182 Exercise counseling: Secondary | ICD-10-CM | POA: Diagnosis not present

## 2020-12-29 DIAGNOSIS — I083 Combined rheumatic disorders of mitral, aortic and tricuspid valves: Secondary | ICD-10-CM | POA: Diagnosis not present

## 2020-12-29 DIAGNOSIS — J8410 Pulmonary fibrosis, unspecified: Secondary | ICD-10-CM | POA: Insufficient documentation

## 2020-12-29 DIAGNOSIS — I351 Nonrheumatic aortic (valve) insufficiency: Secondary | ICD-10-CM

## 2020-12-29 DIAGNOSIS — E2749 Other adrenocortical insufficiency: Secondary | ICD-10-CM | POA: Diagnosis not present

## 2020-12-29 DIAGNOSIS — I2721 Secondary pulmonary arterial hypertension: Secondary | ICD-10-CM

## 2020-12-29 DIAGNOSIS — Z7901 Long term (current) use of anticoagulants: Secondary | ICD-10-CM | POA: Insufficient documentation

## 2020-12-29 DIAGNOSIS — J449 Chronic obstructive pulmonary disease, unspecified: Secondary | ICD-10-CM | POA: Diagnosis not present

## 2020-12-29 DIAGNOSIS — I130 Hypertensive heart and chronic kidney disease with heart failure and stage 1 through stage 4 chronic kidney disease, or unspecified chronic kidney disease: Secondary | ICD-10-CM | POA: Insufficient documentation

## 2020-12-29 DIAGNOSIS — Z7722 Contact with and (suspected) exposure to environmental tobacco smoke (acute) (chronic): Secondary | ICD-10-CM | POA: Insufficient documentation

## 2020-12-29 DIAGNOSIS — I480 Paroxysmal atrial fibrillation: Secondary | ICD-10-CM

## 2020-12-29 NOTE — Progress Notes (Signed)
ADVANCED HF CLINIC CONSULT NOTE  Referring Physician: Dr. Quentin Ore Primary Care: Sabra Heck, [place] Primary Cardiologist: EP Lars Mage  HPI:  75 yo female with pmh significant for chronic diastolic CHF, permanent A. fib, SLE, pulmonary fibrosis, pulmonary hypertension. Paroxysmal VT, OSA not on cpap, valvular disease Aortic, Mitral and Tricuspid, aflutter s/p ablation. Referred by Dr. Quentin Ore for further evaluation of [place] and valvular heart disease.  History of valvular disease with multiple replaced valves AVR/MV ring/TVR 09/25/2010.  Paroxysmal VT, ICD placed due to torsades on Flecainide with SSS requiring PPM which predated valve repar/replacement surgery 09/25/2010 and in fact ICD lead was damaged during valve replacement surgery.       History of chronic COPD and pulmonary fibrosis followed by pulmonology.  On triple therapy for COPD vs asthma,  nintedanib for PF. 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  Did not smoke but inhaled second hand smoke from husband and mother pretty much her whole life until 01/2020.  Continued nintedanib switched inhaler therapy to [facility].    Echocardiogram 12/06/2018: EF 63%, pacemaker in right heart, normal bioprosthetic tricuspid and aortic valve with mild tricuspid regurgitation, mitral valve repair with mild mitral regurgitation, severe biatrial enlargement.   Admitted at [place] for hypotension and acute kidney injury 03/10/2020.  She was placed on broad-spectrum antibiotics with initial diagnosis of sepsis, no source of infection found also required Levophed and IVF infusion to support blood pressure, started on COPD therapy.  There was question of whether adrenal insufficiency contributing.  Steroid therapy reinstituted.   Echo 03/11/2020  LVEF 55 to 60%, moderate to severe tricuspid regurgitation, moderately elevated right ventricular systolic pressure estimate 58mm Hg, moderate mitral regurgitation.  RV not well visualized.     Seen in HF Clinic for first visit 10/27/20. Had NYHA IIIB symptoms. Referred for R/L cath.   R/LHC 11/27/20 Ao =129/63 (89) LV = 141/8 RA = 11 (v waves to 14) RV = 43/4 PA = 42/15 (25) PCW = 10 Fick cardiac output/index = 5.7/3.7 PVR = 2.1 WU SVR = 1101 PAPi = 2.45 WU Ao sat = 96% PA sat =  74%, 75% SVC sat = 82% MVA (LV-wedge tracing) = 3.5cm2 mean gradient across MV 3.75mmHG   Assessment:   1. Normal coronary arteries 2. EF 60-65% 3. Very mild PAH 4. Normal left-sided filling pressures with no significant v-waves in PCWP tracing to suggest hemodynamically significant MR 5. No significant stenosis across AoV, MV or TV  Admitted 11/22 with acute LGIB. CT scan showed acute GI hemorrhage involving mid descending colon, likely acute diverticular bleed. Transfused 3u RBCs. Colonoscopy 11/26 which noted hemorrhoids and diverticulosis, no active bleeding, recommended to restart Xarelto in 3 days if stable.   Here with her daughter for routine f/u. Doing well. Gets around the house ok. No further bleeding. BP at home 120/70s, Denies edema, orthopnea or PND. No dizziness.    Past Medical History:  Diagnosis Date   A-fib ([place])    Anemia    Cardiomyopathy ([place])    CHF (congestive heart failure) (HCC)    CKD (chronic kidney disease)    Diverticulosis    Hypertension    Lupus ([facility])    Osteoarthritis    Pulmonary fibrosis (H[facility])    Vitamin D deficiency     Current Outpatient Medications  Medication Sig Dispense Refill   acetaminophen (TYLENOL) 500 MG tablet Take 1,000 mg by mouth every 6 (six) hours as needed (pain).     albuterol (  VENTOLIN HFA) 108 (90 Base) MCG/ACT inhaler Inhale 2 puffs into the lungs every 6 (six) hours as needed for wheezing or shortness of breath.     allopurinol (ZYLOPRIM) 100 MG tablet TAKE 1 TABLET BY MOUTH EVERY DAY 90 tablet 0   Budeson-Glycopyrrol-Formoterol (BREZTRI AEROSPHERE) 160-9-4.8 MCG/ACT AERO Inhale 2 puffs into the lungs 2 (two) times daily  as needed (shortness of breath).     Calcium Carbonate-Vitamin D (CALTRATE 600+D PO) Take 1 capsule by mouth daily with lunch.     cetirizine HCl (ZYRTEC) 5 MG/5ML SOLN Take 5 mg by mouth at bedtime.     Cholecalciferol 25 MCG (1000 UT) tablet Take 1,000 Units by mouth daily with lunch.     ferrous sulfate 325 (65 FE) MG tablet Take 325 mg by mouth 2 (two) times daily with a meal.     fluticasone (FLONASE) 50 MCG/ACT nasal spray Place 2 sprays into both nostrils daily as needed for allergies.     folic acid (FOLVITE) [id number] MCG tablet Take 800 mcg by mouth daily with lunch.     furosemide (LASIX) 20 MG tablet Take 20 mg by mouth See admin instructions. Take one tablet (20 mg) by mouth every morning, Hold if BP is lower than 110/60     hydroxychloroquine (PLAQUENIL) 200 MG tablet Take 1 tablet (200 mg total) by mouth daily. 90 tablet 0   levothyroxine (SYNTHROID) 25 MCG tablet Take 1 tablet (25 mcg total) by mouth daily. 90 tablet 3   metoprolol succinate (TOPROL-XL) 25 MG 24 hr tablet Take 25 mg by mouth See admin instructions. Take one tablet (25 mg) by mouth every morning, Hold dose if <110 SBP or <60 DBP     Multiple Vitamins-Minerals (MULTIVITAMIN WITH MINERALS) tablet Take 1 tablet by mouth daily with lunch.     Nintedanib (OFEV) 150 MG CAPS Take 150 mg by mouth at bedtime.     polyvinyl alcohol (LIQUIFILM TEARS) 1.4 % ophthalmic solution Place 1 drop into both eyes daily as needed for dry eyes.     predniSONE (DELTASONE) 5 MG tablet Take 1 tablet (5 mg total) by mouth daily with breakfast. Patient to double up dose during sick day rule 100 tablet 3   rivaroxaban (XARELTO) 15 MG TABS tablet Take 1 tablet (15 mg total) by mouth daily with supper. 30 tablet 0   vitamin B-12 (CYANOCOBALAMIN) 1000 MCG tablet Take 1,000 mcg by mouth daily with lunch.     No current facility-administered medications for this encounter.    Allergies  Allergen Reactions   Propofol Other (See Comments)    Caused  asthma   Flecainide Other (See Comments)    Unknown reaction   Amiodarone Other (See Comments)    Delerium/Confusion/Psychosis,    Amlodipine Other (See Comments)    Tired, syncope      Social History   Socioeconomic History   Marital status: Legally Separated    Spouse name: Not on file   Number of children: Not on file   Years of education: Not on file   Highest education level: Not on file  Occupational History   Not on file  Tobacco Use   Smoking status: Never   Smokeless tobacco: Never  Vaping Use   Vaping Use: Never used  Substance and Sexual Activity   Alcohol use: Never   Drug use: Never   Sexual activity: Not on file  Other Topics Concern   Not on file  Social History Narrative   Not on  file   Social Determinants of Health   Financial Resource Strain: Not on file  Food Insecurity: Not on file  Transportation Needs: Not on file  Physical Activity: Not on file  Stress: Not on file  Social Connections: Not on file  Intimate Partner Violence: Not on file      Family History  Problem Relation Age of Onset   Heart disease Mother    Hypertension Mother    Rheum arthritis Mother    Osteoarthritis Mother    Heart disease Father    Hypertension Father    Osteoarthritis Father    Heart disease Sister    Diabetes Brother    Cancer Brother     Vitals:   12/29/20 1534  BP: (!) 150/90  Pulse: 68  SpO2: 100%  Weight: 51.7 kg (114 lb)    PHYSICAL EXAM: General:  Thin elderly woman  No resp difficulty HEENT: normal Neck: supple. JVP 7-8 Carotids 2+ bilat; no bruits. No lymphadenopathy or thryomegaly appreciated. Cor: PMI nondisplaced. Irregular rate & rhythm. 2/6 TR Lungs: minimal dry crackles at bases Abdomen: soft, nontender, nondistended. No hepatosplenomegaly. No bruits or masses. Good bowel sounds. Extremities: no cyanosis, clubbing, rash, edema Neuro: alert & orientedx3, cranial nerves grossly intact. moves all 4 extremities w/o difficulty.  Affect pleasant   ASSESSMENT & PLAN:  Chronic Diastolic CHF due to valvular heart disease -s/p bioprosthetic AVR/MV ring/bioprosthetic TVR 09/25/2010 -Echo 03/11/2020  LVEF 55 to 60%, moderate to severe tricuspid regurgitation, moderately elevated right ventricular systolic pressure estimate 24mm Hg, moderate mitral regurgitation.  RV not well visualized. AV function not assessed. - [facility] 11/22: Mild PAH. RA = 11 (v waves to 14) PA = 42/15 (25) PCW = 10 Fick cardiac output/index = 5.7/3.7 PVR = 2.1 WU MVA (LV-wedge tracing) = 3.5cm2 mean gradient across MV 3.12mmHG - Stable NYHA III. Volume status ok  - Overall stable. Only mild PAH. Will continue medical therapy for now. If symptoms worsen can consider TEE - I have asked her to walk more  2. Pulmonary HTN by ECHO - [facility] 11/22 with mild PAH and normal PVR. (See above)  3. ILD -11/2018 HRCT with stable ILD compared with 2018.  2018 CT Slowly progressive basal predominant, moderately severe pulmonary fibrosis. Favor NSIP -no visible PFT's on care everywhere -followed now by [name] and continued on Ofev, bronchodilators  4. OSA -resolved with weight loss and cpap discontinued  5. Permanent Afib with SSS - has ICD in place due to h/o VT  -Followed by Dr. Lalla Brothers, 04/2020 was 99% V pacing.   - On xarelto  6. H/o VT - ICD in palce  7. SLE -followed by Dr. Dimple Casey, lupus felt to be under good control -continued on HCQ and she is also on 5mg  prednisone for secondary adrenal insufficiency  8. Recent diverticular bleed - seems to have resolved  , MD  10:11 PM

## 2020-12-29 NOTE — Patient Instructions (Signed)
Your physician recommends that you schedule a follow-up appointment in: 3-4 months  If you have any questions or concerns before your next appointment please send [place] a message through [place] or call our office at [phone].    TO LEAVE A MESSAGE FOR THE NURSE SELECT OPTION 2, PLEASE LEAVE A MESSAGE INCLUDING: YOUR NAME DATE OF BIRTH CALL BACK NUMBER REASON FOR CALL**this is important as we prioritize the call backs  YOU WILL RECEIVE A CALL BACK THE SAME DAY AS LONG AS YOU CALL BEFORE 4:00 PM  At the [facility], you and your health needs are our priority. As part of our continuing mission to provide you with exceptional heart care, we have created designated Provider Care Teams. These Care Teams include your primary Cardiologist (physician) and Advanced Practice Providers (APPs- Physician Assistants and Nurse Practitioners) who all work together to provide you with the care you need, when you need it.   You may see any of the following providers on your designated Care Team at your next follow up: Dr Arvilla Meres Dr Carron Curie, NP Robbie Lis, [name] [facility] [employer], [name] Karle Plumber, PharmD   Please be sure to bring in all your medications bottles to every appointment.

## 2020-12-31 ENCOUNTER — Ambulatory Visit: Payer: Medicare HMO | Admitting: Physical Therapy

## 2021-01-01 NOTE — Discharge Summary (Signed)
Physician Discharge Summary  Lindsey Huber [id number] DOB: July 03, 1945 DOA: 12/13/2020  PCP: Scheryl Marten, PA  Admit date: 12/13/2020 Discharge date: 12/23/2020  Time spent: 35 minutes  Recommendations for Outpatient Follow-up:  PCP in 1 week, please check CBC at follow-up, advised to resume Eliquis on 12/3 Heart failure team Dr. Haroldine Laws in few weeks   Discharge Diagnoses:  Principal Problem:   Acute GI bleeding Acute blood loss anemia   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[place])   Systemic lupus erythematosus ([place])   Chronic heart failure with preserved ejection fraction ([place])   Non-rheumatic atrial fibrillation ([employer])   Secondary adrenal insufficiency ([place])   Hypotension   Hypokalemia   Hypocalcemia   Hypomagnesemia   Discharge Condition: Stable  Diet recommendation: Low-sodium, heart healthy  Filed Weights   12/14/20 1401 12/20/20 1235 12/21/20 0400  Weight: 53.7 kg 53.7 kg 58.4 kg    History of present illness:  75/F chronically ill with chronic diastolic CHF, valvular heart disease s/p bioprosthetic AVR, mitral valve annuloplasty, bioprosthetic TVR, paroxysmal atrial fibrillation on Eliquis, history of VT status post AICD, history of lupus, interstitial lung disease, pulmonary hypertension -Was admitted with hematochezia and severe acute blood loss anemia, hemoglobin of 6  -CT angiography noted acute gastrointestinal hemorrhage involving the mid descending colon likely representing an active diverticular hemorrhage.   -She was transfused 2 units of PRBC, anticoagulation was held and received Kcentra for reversal -seen by gastroenterology and and interventional radiology in consultation  -IR was reconsulted 11/22 for ongoing bleeding    Hospital Course:   Acute diverticular bleeding  Acute blood loss anemia -Transfused 3 units of PRBC this admission -CTA 11/19 noted acute GI hemorrhage involving mid descending colon, likely acute diverticular  hge -Bleeding has continued to wax and wane,  not profuse anymore -IR was reconsulted, nuclear medicine scan was negative -Multiple episodes of bleeding with clots last week and 11/24-11/25, followed by gastroenterology  underwent colonoscopy 11/26 which noted hemorrhoids and diverticulosis, no active bleeding, recommended to restart Eliquis in 3 days if stable -Also given IV iron this admission -Hemoglobin 8.6 at discharge, no active bleeding for the last 48 hours -Follow-up with PCP in 1 week   Chronic diastolic CHF Valvular heart disease, s/p bioprosthetic AVR, TVR, mitral valve ring Pulmonary hypertension -Last echo 2/22 noted EF of 55-60%, moderate to severe TR, moderate PAH, moderate MR -Followed by heart failure clinic -Clinically euvolemic at this time -Resumed p.o. Lasix    Paroxysmal atrial fibrillation, atrial flutter Patient has remained on paced rhythm. -Eliquis to be resumed in 2 more days   Hyponatremia, hypokalemia, hypomagnesemia and hypocalcemia/ non anion gap metabolic acidosis -Improved, repleted   SLE Interstitial lung disease No signs of acute exacerbation.  -Followed by Dr. Silas Flood Continue with Nintedanib and hydroxychloroquine    Secondary adrenal insufficiency.  No acute decompensation, continue with oral prednisone at her home dose.     Hypothyroid.  Continue with levothyroxine     HTN  -Stable, continue metoprolol   Gout.  -continue with allopurinol.   Consultants:  GI    Procedures: Colonoscopy 11/26 noted hemorrhoids and diverticulosis in the sigmoid and descending colon, no active bleeding  IR    Discharge Exam: Vitals:   12/23/20 0817 12/23/20 0826  BP: (!) 89/73 (!) 140/57  Pulse: 71   Resp: 15   Temp: 98.1 F (36.7 C)   SpO2:    General: Pleasant chronically ill female sitting up in bed, AAOx3, no distress HEENT: No JVD CVS:  S1-S2, regular rate rhythm Lungs: Diffuse bilateral crackles Abdomen: Soft, nontender, bowel  sounds present Extremities: No edema Skin: no new rashes on exposed skin     Discharge Instructions   Discharge Instructions     Diet - low sodium heart healthy   Complete by: As directed    Increase activity slowly   Complete by: As directed       Allergies as of 12/23/2020       Reactions   Propofol Other (See Comments)   Caused asthma   Flecainide Other (See Comments)   Unknown reaction   Amiodarone Other (See Comments)   Delerium/Confusion/Psychosis,    Amlodipine Other (See Comments)   Tired, syncope        Medication List     STOP taking these medications    amoxicillin 500 MG capsule Commonly known as: AMOXIL       TAKE these medications    acetaminophen 500 MG tablet Commonly known as: TYLENOL Take 1,000 mg by mouth every 6 (six) hours as needed (pain).   albuterol 108 (90 Base) MCG/ACT inhaler Commonly known as: VENTOLIN HFA Inhale 2 puffs into the lungs every 6 (six) hours as needed for wheezing or shortness of breath.   allopurinol 100 MG tablet Commonly known as: ZYLOPRIM TAKE 1 TABLET BY MOUTH EVERY DAY   Breztri Aerosphere 160-9-4.8 MCG/ACT Aero Generic drug: Budeson-Glycopyrrol-Formoterol Inhale 2 puffs into the lungs 2 (two) times daily as needed (shortness of breath).   CALTRATE 600+D PO Take 1 capsule by mouth daily with lunch.   cetirizine HCl 5 MG/5ML Soln Commonly known as: Zyrtec Take 5 mg by mouth at bedtime.   Cholecalciferol 25 MCG (1000 UT) tablet Take 1,000 Units by mouth daily with lunch.   fluticasone 50 MCG/ACT nasal spray Commonly known as: FLONASE Place 2 sprays into both nostrils daily as needed for allergies.   folic acid Q000111Q MCG tablet Commonly known as: FOLVITE Take 800 mcg by mouth daily with lunch.   furosemide 20 MG tablet Commonly known as: LASIX Take 20 mg by mouth See admin instructions. Take one tablet (20 mg) by mouth every morning, Hold if BP is lower than 110/60   hydroxychloroquine 200  MG tablet Commonly known as: PLAQUENIL Take 1 tablet (200 mg total) by mouth daily.   levothyroxine 25 MCG tablet Commonly known as: SYNTHROID Take 1 tablet (25 mcg total) by mouth daily.   metoprolol succinate 25 MG 24 hr tablet Commonly known as: TOPROL-XL Take 25 mg by mouth See admin instructions. Take one tablet (25 mg) by mouth every morning, Hold dose if <110 SBP or <60 DBP   multivitamin with minerals tablet Take 1 tablet by mouth daily with lunch.   Ofev 150 MG Caps Generic drug: Nintedanib Take 150 mg by mouth at bedtime.   polyvinyl alcohol 1.4 % ophthalmic solution Commonly known as: LIQUIFILM TEARS Place 1 drop into both eyes daily as needed for dry eyes.   predniSONE 5 MG tablet Commonly known as: DELTASONE Take 1 tablet (5 mg total) by mouth daily with breakfast. Patient to double up dose during sick day rule   Rivaroxaban 15 MG Tabs tablet Commonly known as: XARELTO Take 1 tablet (15 mg total) by mouth daily with supper. What changed:  medication strength how much to take   vitamin B-12 1000 MCG tablet Commonly known as: CYANOCOBALAMIN Take 1,000 mcg by mouth daily with lunch.       Allergies  Allergen Reactions   Propofol  Other (See Comments)    Caused asthma   Flecainide Other (See Comments)    Unknown reaction   Amiodarone Other (See Comments)    Delerium/Confusion/Psychosis,    Amlodipine Other (See Comments)    Tired, syncope    Follow-up Information     [place], [place] E, PA. Schedule an appointment as soon as possible for a visit in 1 week(s).   Specialty: Internal Medicine Contact information: [address] [place] [address] [phone]                  The results of significant diagnostics from this hospitalization (including imaging, microbiology, ancillary and laboratory) are listed below for reference.    Significant Diagnostic Studies: NM GI Blood Loss  Result Date: 12/17/2020 CLINICAL  DATA:  Gastrointestinal bleeding, previous abnormal CT angiography demonstrating bleeding in the proximal descending colon, last episode of bleeding this morning at 8 a.m. EXAM: NUCLEAR MEDICINE GASTROINTESTINAL BLEEDING SCAN TECHNIQUE: Sequential abdominal images were obtained following intravenous administration of Tc-56m labeled red blood cells. RADIOPHARMACEUTICALS:  24.6 mCi Tc-22m pertechnetate in-vitro labeled red cells. COMPARISON:  12/13/2020 FINDINGS: Anterior planar images of the abdomen and pelvis are obtained for 2 hours after radiotracer administration. There is normal distribution of radiotracer throughout the vascular structures, liver, and heart. A small amount of excreted tracer is seen filling the bladder lumen. There is no abnormal accumulation of radiotracer to suggest active gastrointestinal bleeding. Specifically, no radiotracer accumulation within the region of the descending colon at the site of prior hemorrhage seen on CT angiography. IMPRESSION: 1. No evidence of active gastrointestinal bleeding. Electronically Signed   By: Sharlet Salina M.D.   On: 12/17/2020 16:07   DG Chest Portable 1 View  Result Date: 12/13/2020 CLINICAL DATA:  Shortness of breath EXAM: PORTABLE CHEST 1 VIEW COMPARISON:  05/12/2020 FINDINGS: Cardiac shadow is enlarged. Postsurgical changes and defibrillator are again seen and stable. Mild vascular congestion is again noted. No focal infiltrate or effusion is seen. No bony abnormality is noted. IMPRESSION: Mild vascular congestion stable from the prior exam. Electronically Signed   By: Alcide Clever M.D.   On: 12/13/2020 19:54   CT ANGIO GI BLEED  Result Date: 12/13/2020 CLINICAL DATA:  Bright red blood per rectum, hypertension EXAM: CTA ABDOMEN AND PELVIS WITHOUT AND WITH CONTRAST TECHNIQUE: Multidetector CT imaging of the abdomen and pelvis was performed using the standard protocol during bolus administration of intravenous contrast. Multiplanar  reconstructed images and MIPs were obtained and reviewed to evaluate the vascular anatomy. CONTRAST:  OMNIPAQUE IOHEXOL 350 MG/ML SOLN COMPARISON:  06/30/2020 FINDINGS: VASCULAR Aorta: Minimal atherosclerotic calcification. Normal caliber; no aneurysm or dissection. No periaortic inflammatory change. Celiac: Patent without evidence of aneurysm, dissection, vasculitis or significant stenosis. SMA: Patent without evidence of aneurysm, dissection, vasculitis or significant stenosis. Renals: Single right and dual left renal arteries are widely patent demonstrate normal vascular morphology. No aneurysm or dissection. IMA: Patent without evidence of aneurysm, dissection, vasculitis or significant stenosis. Inflow: Patent without evidence of aneurysm, dissection, vasculitis or significant stenosis. Proximal Outflow: Bilateral common femoral and visualized portions of the superficial and profunda femoral arteries are patent without evidence of aneurysm, dissection, vasculitis or significant stenosis. Veins: Unremarkable Review of the MIP images confirms the above findings. NON-VASCULAR Lower chest: Ground-glass pulmonary infiltrate with associated intra and interlobular septal thickening and traction bronchiectasis in keeping with pulmonary fibrosis again noted at the visualized lung bases, similar to prior examination. Tricuspid, mitral, and possible aortic valve  replacement has been performed. Pacemaker leads are seen within the right atrium and right ventricle. Moderate cardiomegaly with biatrial enlargement. Reflux of contrast into the hepatic venous system is in keeping with at least some degree of right heart failure or tricuspid regurgitation. Hepatobiliary: Cholelithiasis without pericholecystic inflammatory change. Gallbladder unremarkable. No intra or extrahepatic biliary ductal dilation. Pancreas: Unremarkable Spleen: Unremarkable Adrenals/Urinary Tract: Adrenal glands are unremarkable. Kidneys are normal,  without renal calculi, focal lesion, or hydronephrosis. Bladder is unremarkable. Stomach/Bowel: There is active gastrointestinal hemorrhage involving the mid descending colon, best seen on axial image # 62/6 and coronal image # 96/11. The underlying etiology of the hemorrhage is not clearly identified, however, this most likely represents a small diverticular hemorrhage. There is background moderate sigmoid diverticulosis. The stomach, small bowel, and large bowel are otherwise unremarkable. The appendix is not clearly identified and is likely absent. Lymphatic: No pathologic adenopathy within the abdomen and pelvis. Reproductive: Uterus and bilateral adnexa are unremarkable. Other: No abdominal wall hernia.  The rectum is unremarkable. Musculoskeletal: There is grade 2 anterolisthesis of L4-5 with superimposed advanced degenerative changes. Resultant marked central canal stenosis best seen on axial image # 70/6. Superior endplate fracture of L3 with approximately 10-20% loss of height noted. Osseous structures are diffusely osteopenic. No acute bone abnormality identified. No lytic or blastic bone lesions. IMPRESSION: VASCULAR No acute findings. NON-VASCULAR Active gastrointestinal hemorrhage involving the mid descending colon likely representing an active diverticular hemorrhage. Stable bibasilar pulmonary fibrotic change. Moderate cardiomegaly with biatrial enlargement and at least some degree of right heart failure or tricuspid regurgitation. Cholelithiasis. Moderate sigmoid diverticulosis. Grade 2 anterolisthesis L4-5 with superimposed advanced degenerative change. Resultant marked central canal stenosis. These results were called by telephone at the time of interpretation on 12/13/2020 at 9:52 pm to provider [facility] , who verbally acknowledged these results. Electronically Signed   By: Fidela Salisbury M.D.   On: 12/13/2020 21:54    Microbiology: No results found for this or any previous visit (from  the past 240 hour(s)).   Labs: Basic Metabolic Panel: No results for input(s): NA, K, CL, CO2, GLUCOSE, BUN, CREATININE, CALCIUM, MG, PHOS in the last 168 hours. Liver Function Tests: No results for input(s): AST, ALT, ALKPHOS, BILITOT, PROT, ALBUMIN in the last 168 hours. No results for input(s): LIPASE, AMYLASE in the last 168 hours. No results for input(s): AMMONIA in the last 168 hours. CBC: No results for input(s): WBC, NEUTROABS, HGB, HCT, MCV, PLT in the last 168 hours. Cardiac Enzymes: No results for input(s): CKTOTAL, CKMB, CKMBINDEX, TROPONINI in the last 168 hours. BNP: BNP (last 3 results) Recent Labs    05/12/20 1754 10/27/20 1556 12/13/20 1846  BNP 567.7* 419.2* 117.6*    ProBNP (last 3 results) No results for input(s): PROBNP in the last 8760 hours.  CBG: No results for input(s): GLUCAP in the last 168 hours.     Signed:  Domenic Polite MD.  [facility] 01/01/2021, 3:20 PM

## 2021-01-04 NOTE — Progress Notes (Signed)
Office Visit Note  Patient: Lindsey Huber             Date of Birth: [birth year]           MRN: [id number]             PCP: Scheryl Marten, PA Referring: Scheryl Marten, [name] Visit Date: 01/05/2021   Subjective:  Follow-up (Doing good)   History of Present Illness: Lindsey Huber is a 75 y.o. female here for follow up for SLE with inflammatory arthritis and gout on hydroxychloroquine 200 mg daily and allopurinol 100 mg. 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  Since our last visit she had a significant event with hospitalization for hematochezia work-up identified a diverticular bleed.  She did have sufficient anemia required blood transfusion otherwise no major complications.  She has had no major change in pulmonary symptoms no new skin rashes or swelling.  Does have mild pedal edema.  Currently she is experiencing some increased knee pain and stiffness.  Previous HPI 08/20/20 Lindsey Huber is a 75 y.o. female here for follow up for SLE with inflammatory arthritis and gout currently on HCQ 200 mg and allopurinol 100 mg.  She denies any significant flareup of symptoms since her last visit. She remains on Ofev 150 mg BID for ILD. She has ongoing problems with her left knee sometimes increased pain and stiffness in this joint and feeling that it gives out on her.  She reports a fall in April of this year after her left knee gave out on her also slipping in May according to our last visit.  She uses a rolling walker for stability and sometimes uses a soft knee brace. She worked with physical therapy for gait earlier this year. She saw endocrinology clinic this morning and is now restarting steroids at 5 mg prednisone daily for chronic adrenal insufficiency most likely from long-term use of steroids for rheumatology.   Previous HPI: 06/27/20 Lindsey Huber is a 75 y.o. female here to establish care for systemic lupus and gouty  arthritis currently on hydroxychloroquine 200 mg p.o. daily and allopurinol 100 mg p.o. daily.  She was originally diagnosed due to symptoms of multiple joint pains with orthopedic surgery evaluation in [place] with additional work-up revealing for systemic lupus.  She has previously taken steroids and was on low-dose prednisone 5 mg daily for quite some time although discontinued at her most recent hospitalization.  She has had episodic gout flares involving both feet.  Lupus symptoms have been predominantly arthritis possibly a overlap with rheumatoid arthritis with no known history of lupus nephritis.  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  She has also had intra-articular steroid injection of the knees for arthritis symptoms with reasonably good benefit.  Her left knee has had some frequent slipping or instability reported she describes a fall last week despite routine use of a walker and sometimes knee brace for this.   Review of Systems  Constitutional:  Positive for fatigue.  HENT:  Positive for mouth dryness.   Eyes:  Positive for dryness.  Respiratory:  Positive for shortness of breath.   Cardiovascular:  Negative for swelling in legs/feet.  Gastrointestinal:  Negative for constipation.  Endocrine: Positive for cold intolerance.  Genitourinary:  Negative for difficulty urinating.  Musculoskeletal:  Positive for joint pain, gait problem, joint pain and morning stiffness.  Skin:  Negative for rash.  Allergic/Immunologic: Negative for susceptible to infections.  Neurological:  Positive for numbness.  Hematological:  Positive for bruising/bleeding tendency.  Psychiatric/Behavioral:  Positive for sleep disturbance.    PMFS History:  Patient Active Problem List   Diagnosis Date Noted   Hypotension 12/14/2020   Hypokalemia 12/14/2020   Hypocalcemia 12/14/2020   Hypomagnesemia 12/14/2020   Acute GI bleeding 12/13/2020   Multinodular  goiter 08/20/2020   Acquired hypothyroidism 08/20/2020   Idiopathic gout of multiple sites 06/27/2020   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   Vitamin D deficiency 05/01/2020   Mammogram abnormal 05/01/2020   Proteinuria 05/01/2020   Other bilateral secondary osteoarthritis of knee 05/01/2020   Osteoarthritis of hip 05/01/2020   HTN (hypertension) 05/01/2020   GERD without esophagitis 05/01/2020   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[place]) 05/01/2020   Cardiomyopathy ([place]) 05/01/2020   Iatrogenic hypotension 05/01/2020   Hypercoagulability due to atrial fibrillation ([place]) 05/01/2020   Systemic lupus erythematosus ([place]) 05/01/2020   Chronic heart failure with preserved ejection fraction ([place]) 05/01/2020   Paroxysmal atrial fibrillation with RVR ([place]) 05/01/2020   Protein calorie malnutrition ([place]) 05/01/2020   Chronic kidney disease (CKD), active medical management without dialysis, stage 3 (moderate) ([place]) 05/01/2020   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[place]) 05/01/2020   Non-rheumatic atrial fibrillation ([place]) 05/01/2020   Atherosclerosis of abdominal aorta ([place]) 05/01/2020   Diverticulosis of colon 05/01/2020   Cholelithiasis 05/01/2020   Secondary adrenal insufficiency ([place]) 05/01/2020   Anemia of chronic disease 05/01/2020    Past Medical History:  Diagnosis Date   A-fib ([place])    Anemia    Cardiomyopathy ([place])    CHF (congestive heart failure) ([place])    CKD (chronic kidney disease)    Diverticulosis    Hypertension    Lupus ([place])    Osteoarthritis    Pulmonary fibrosis ([place])    Vitamin D deficiency     Family History  Problem Relation Age of Onset   Heart disease Mother    Hypertension Mother    Rheum arthritis Mother    Osteoarthritis Mother    Heart disease Father    Hypertension Father    Osteoarthritis Father    Heart disease Sister    Diabetes Brother    Cancer Brother    Past Surgical History:  Procedure Laterality Date    CARDIAC VALVE SURGERY     CARPAL TUNNEL RELEASE     COLONOSCOPY WITH PROPOFOL N/A 12/20/2020   Procedure: COLONOSCOPY WITH PROPOFOL;  Surgeon: Arta Silence, MD;  Location: [facility];  Service: Endoscopy;  Laterality: N/A;   HEMORRHOID SURGERY     PACEMAKER INSERTION     RIGHT/LEFT HEART CATH AND CORONARY ANGIOGRAPHY N/A 11/27/2020   Procedure: RIGHT/LEFT HEART CATH AND CORONARY ANGIOGRAPHY;  Surgeon: Jolaine Artist, MD;  Location: [facility];  Service: Cardiovascular;  Laterality: N/A;   TONSILLECTOMY     Social History   Social History Narrative   Not on file   Immunization History  Administered Date(s) Administered   Influenza-Unspecified 10/26/2019   PFIZER(Purple Top)SARS-COV-2 Vaccination 05/08/2019     Objective: Vital Signs: BP 131/69 (BP Location: Right Arm, Patient Position: Sitting, Cuff Size: Normal)   Pulse 93   Resp 16   Ht 5\' 2"  (1.575 m)   Wt 117 lb (53.1 kg)   BMI 21.40 kg/m    Physical Exam Eyes:     Conjunctiva/sclera: Conjunctivae normal.  Cardiovascular:     Rate and Rhythm: Normal rate and regular rhythm.  Pulmonary:  Effort: Pulmonary effort is normal.     Breath sounds: Normal breath sounds.  Musculoskeletal:     Comments: Mild pitting edema both sides slightly above ankles  Skin:    General: Skin is warm and dry.  Neurological:     Mental Status: She is alert.  Psychiatric:        Mood and Affect: Mood normal.     Musculoskeletal Exam:  Shoulders full ROM no tenderness or swelling Elbows full ROM no tenderness or swelling Wrists full ROM no tenderness or swelling Fingers full ROM no tenderness or swelling Knees full ROM, mild joint line tenderness to pressure no palpable effusions Ankles full ROM no tenderness or swelling    Investigation: No additional findings.  Imaging: NM GI Blood Loss  Result Date: 12/17/2020 CLINICAL DATA:  Gastrointestinal bleeding, previous abnormal CT angiography demonstrating  bleeding in the proximal descending colon, last episode of bleeding this morning at 8 a.m. EXAM: NUCLEAR MEDICINE GASTROINTESTINAL BLEEDING SCAN TECHNIQUE: Sequential abdominal images were obtained following intravenous administration of Tc-81m labeled red blood cells. RADIOPHARMACEUTICALS:  24.6 mCi Tc-37m pertechnetate in-vitro labeled red cells. COMPARISON:  12/13/2020 FINDINGS: Anterior planar images of the abdomen and pelvis are obtained for 2 hours after radiotracer administration. There is normal distribution of radiotracer throughout the vascular structures, liver, and heart. A small amount of excreted tracer is seen filling the bladder lumen. There is no abnormal accumulation of radiotracer to suggest active gastrointestinal bleeding. Specifically, no radiotracer accumulation within the region of the descending colon at the site of prior hemorrhage seen on CT angiography. IMPRESSION: 1. No evidence of active gastrointestinal bleeding. Electronically Signed   By: Randa Ngo M.D.   On: 12/17/2020 16:07   DG Chest Portable 1 View  Result Date: 12/13/2020 CLINICAL DATA:  Shortness of breath EXAM: PORTABLE CHEST 1 VIEW COMPARISON:  05/12/2020 FINDINGS: Cardiac shadow is enlarged. Postsurgical changes and defibrillator are again seen and stable. Mild vascular congestion is again noted. No focal infiltrate or effusion is seen. No bony abnormality is noted. IMPRESSION: Mild vascular congestion stable from the prior exam. Electronically Signed   By: Inez Catalina M.D.   On: 12/13/2020 19:54   CT ANGIO GI BLEED  Result Date: 12/13/2020 CLINICAL DATA:  Bright red blood per rectum, hypertension EXAM: CTA ABDOMEN AND PELVIS WITHOUT AND WITH CONTRAST TECHNIQUE: Multidetector CT imaging of the abdomen and pelvis was performed using the standard protocol during bolus administration of intravenous contrast. Multiplanar reconstructed images and MIPs were obtained and reviewed to evaluate the vascular anatomy.  CONTRAST:  138mL OMNIPAQUE IOHEXOL 350 MG/ML SOLN COMPARISON:  06/30/2020 FINDINGS: VASCULAR Aorta: Minimal atherosclerotic calcification. Normal caliber; no aneurysm or dissection. No periaortic inflammatory change. Celiac: Patent without evidence of aneurysm, dissection, vasculitis or significant stenosis. SMA: Patent without evidence of aneurysm, dissection, vasculitis or significant stenosis. Renals: Single right and dual left renal arteries are widely patent demonstrate normal vascular morphology. No aneurysm or dissection. IMA: Patent without evidence of aneurysm, dissection, vasculitis or significant stenosis. Inflow: Patent without evidence of aneurysm, dissection, vasculitis or significant stenosis. Proximal Outflow: Bilateral common femoral and visualized portions of the superficial and profunda femoral arteries are patent without evidence of aneurysm, dissection, vasculitis or significant stenosis. Veins: Unremarkable Review of the MIP images confirms the above findings. NON-VASCULAR Lower chest: Ground-glass pulmonary infiltrate with associated intra and interlobular septal thickening and traction bronchiectasis in keeping with pulmonary fibrosis again noted at the visualized lung bases, similar to prior examination. Tricuspid, mitral, and possible  aortic valve replacement has been performed. Pacemaker leads are seen within the right atrium and right ventricle. Moderate cardiomegaly with biatrial enlargement. Reflux of contrast into the hepatic venous system is in keeping with at least some degree of right heart failure or tricuspid regurgitation. Hepatobiliary: Cholelithiasis without pericholecystic inflammatory change. Gallbladder unremarkable. No intra or extrahepatic biliary ductal dilation. Pancreas: Unremarkable Spleen: Unremarkable Adrenals/Urinary Tract: Adrenal glands are unremarkable. Kidneys are normal, without renal calculi, focal lesion, or hydronephrosis. Bladder is unremarkable.  Stomach/Bowel: There is active gastrointestinal hemorrhage involving the mid descending colon, best seen on axial image # 62/6 and coronal image # 96/11. The underlying etiology of the hemorrhage is not clearly identified, however, this most likely represents a small diverticular hemorrhage. There is background moderate sigmoid diverticulosis. The stomach, small bowel, and large bowel are otherwise unremarkable. The appendix is not clearly identified and is likely absent. Lymphatic: No pathologic adenopathy within the abdomen and pelvis. Reproductive: Uterus and bilateral adnexa are unremarkable. Other: No abdominal wall hernia.  The rectum is unremarkable. Musculoskeletal: There is grade 2 anterolisthesis of L4-5 with superimposed advanced degenerative changes. Resultant marked central canal stenosis best seen on axial image # 70/6. Superior endplate fracture of L3 with approximately 10-20% loss of height noted. Osseous structures are diffusely osteopenic. No acute bone abnormality identified. No lytic or blastic bone lesions. IMPRESSION: VASCULAR No acute findings. NON-VASCULAR Active gastrointestinal hemorrhage involving the mid descending colon likely representing an active diverticular hemorrhage. Stable bibasilar pulmonary fibrotic change. Moderate cardiomegaly with biatrial enlargement and at least some degree of right heart failure or tricuspid regurgitation. Cholelithiasis. Moderate sigmoid diverticulosis. Grade 2 anterolisthesis L4-5 with superimposed advanced degenerative change. Resultant marked central canal stenosis. These results were called by telephone at the time of interpretation on 12/13/2020 at 9:52 pm to provider [facility] , who verbally acknowledged these results. Electronically Signed   By: Fidela Salisbury M.D.   On: 12/13/2020 21:54    Recent Labs: Lab Results  Component Value Date   WBC 5.9 12/23/2020   HGB 8.6 (L) 12/23/2020   PLT 131 (L) 12/23/2020   NA 140 12/23/2020   K  3.9 12/23/2020   CL 106 12/23/2020   CO2 28 12/23/2020   GLUCOSE 87 12/23/2020   BUN 13 12/23/2020   CREATININE 1.18 (H) 12/23/2020   BILITOT 0.6 12/13/2020   ALKPHOS 27 (L) 12/13/2020   AST 26 12/13/2020   ALT 17 12/13/2020   PROT 4.4 (L) 12/13/2020   ALBUMIN 1.8 (L) 12/13/2020   CALCIUM 8.6 (L) 12/23/2020    Speciality Comments: PLQ Eye Exam- 09/19/2020, nothing abnormal related to PLQ F/u in 12 months  Procedures:  No procedures performed Allergies: Propofol, Flecainide, Amiodarone, and Amlodipine   Assessment / Plan:     Visit Diagnoses: Systemic lupus erythematosus, unspecified SLE type, unspecified organ involvement status ([place]) - Plan: hydroxychloroquine (PLAQUENIL) 200 MG tablet  No particular evidence of new disease activity blood counts and metabolic panel reviewed from recent hospitalization.  Normal lupus monitoring labs from about 6 months ago clinically stable so we will wait to recheck these in future follow-up.  Continue hydroxychloroquine 200 mg p.o. daily. Chronic low dose prednisone not strictly for this indication but likely adding to control of any symptoms recurrence.  Idiopathic chronic gout of multiple sites without tophus - Plan: allopurinol (ZYLOPRIM) 100 MG tablet  No new episodes of inflammatory joint disease nothing on exam today continue allopurinol 100 mg daily.  Chronic kidney disease (CKD), active medical management without  dialysis, stage 3 (moderate) (HCC)  Stable by most recent labs checked during hospitalization she has upcoming follow-up with primary care office with plan for hospital follow-up repeat labs.  Other bilateral secondary osteoarthritis of knee  Knee pain appears more consistent with osteoarthritis symptoms no swelling crepitus and bony changes on exam discussed treatment options for this including low impact strengthening exercise for legs.  Orders: No orders of the defined types were placed in this encounter.  Meds ordered  this encounter  Medications   hydroxychloroquine (PLAQUENIL) 200 MG tablet    Sig: Take 1 tablet (200 mg total) by mouth daily.    Dispense:  90 tablet    Refill:  0   allopurinol (ZYLOPRIM) 100 MG tablet    Sig: Take 1 tablet (100 mg total) by mouth daily.    Dispense:  90 tablet    Refill:  0      Follow-Up Instructions: Return in about 3 months (around 04/05/2021) for SLE/OA f/u 48mos.   Fuller Plan, MD  Note - This record has been created using [employer].  Chart creation errors have been sought, but may not always  have been located. Such creation errors do not reflect on  the standard of medical care.

## 2021-01-05 ENCOUNTER — Other Ambulatory Visit: Payer: Self-pay

## 2021-01-05 ENCOUNTER — Ambulatory Visit (INDEPENDENT_AMBULATORY_CARE_PROVIDER_SITE_OTHER): Payer: Medicare HMO | Admitting: Internal Medicine

## 2021-01-05 ENCOUNTER — Encounter: Payer: Self-pay | Admitting: Internal Medicine

## 2021-01-05 VITALS — BP 131/69 | HR 93 | Resp 16 | Ht 62.0 in | Wt 117.0 lb

## 2021-01-05 DIAGNOSIS — M174 Other bilateral secondary osteoarthritis of knee: Secondary | ICD-10-CM | POA: Diagnosis not present

## 2021-01-05 DIAGNOSIS — N183 Chronic kidney disease, stage 3 unspecified: Secondary | ICD-10-CM

## 2021-01-05 DIAGNOSIS — M1A09X0 Idiopathic chronic gout, multiple sites, without tophus (tophi): Secondary | ICD-10-CM

## 2021-01-05 DIAGNOSIS — M329 Systemic lupus erythematosus, unspecified: Secondary | ICD-10-CM | POA: Diagnosis not present

## 2021-01-05 MED ORDER — HYDROXYCHLOROQUINE SULFATE 200 MG PO TABS: 200.0000 mg | ORAL_TABLET | Freq: Every day | ORAL | 0 refills | Status: DC

## 2021-01-05 MED ORDER — ALLOPURINOL 100 MG PO TABS: 100.0000 mg | ORAL_TABLET | Freq: Every day | ORAL | 0 refills | Status: DC

## 2021-01-05 NOTE — Patient Instructions (Signed)
For osteoarthritis several treatments may be beneficial:  - Topical antiinflammatory medicine such as diclofenac or Voltaren can be applied to  affected area as needed but may be less effective than oral antiinflammatory medicine. Topical analgesics containing CBD, menthol, or lidocaine can be tried.   - Other oral supplements such as glucosamine chondroitin or chondroitin sulfate do not have much strong data supporting effectiveness but can be helpful for some individuals and have no major side effects.  - Turmeric has some antiinflammatory effect similar to NSAID medications and may help, if taken as a supplement should not be taken above recommended doses.  - Physical therapy can discuss exercises or activity modification to improve symptoms or strength if needed. Strengthening exercises for the legs decrease the pressure on the joint and can help symptoms.  - Local steroid injection is an option if symptoms become worse and not controlled by the above options.

## 2021-01-08 ENCOUNTER — Other Ambulatory Visit: Payer: Self-pay | Admitting: Internal Medicine

## 2021-01-08 DIAGNOSIS — R2242 Localized swelling, mass and lump, left lower limb: Secondary | ICD-10-CM

## 2021-01-13 ENCOUNTER — Other Ambulatory Visit: Payer: Medicare HMO

## 2021-01-14 ENCOUNTER — Ambulatory Visit
Admission: RE | Admit: 2021-01-14 | Discharge: 2021-01-14 | Disposition: A | Discharge status: Home / Self Care | Payer: Medicare HMO | Source: Ambulatory Visit | Attending: Internal Medicine | Admitting: Internal Medicine

## 2021-01-14 DIAGNOSIS — R2242 Localized swelling, mass and lump, left lower limb: Secondary | ICD-10-CM

## 2021-01-20 NOTE — Progress Notes (Signed)
NEUROLOGY FOLLOW UP OFFICE NOTE  Lindsey Huber [id number]  Assessment/Plan:    Transient neurologic symptoms - Probable transient ischemic attack.   Left temporal headache - Giant cell arteritis not suspected.  Sed rate only mildly elevated and CRP normal. A fib Hypertension  Secondary stroke prevention as managed by PCP/cardiology: Xarelto Normotensive blood pressure LDL goal less than 70 Hgb W0J goal less than 7 If headaches do not continue to improve, consider gabapentin or antidepressant. Follow up 7 months.   Subjective:  Lindsey Huber is a 75 year old right-handed female with a fib, cardiomyopathy, HTN, CHF, lupus, pulmonary fibrosis and CKD who follows up for TIA.  UPDATE: Current medications:  Xarelto, Toprol-XL, furosemide,   To further evaluate headache, CRP checked in June, which was 1.2.  Overall, headaches are stable.  It is almost daily, some days worse than others.  Treats with Tylenol 2 to 3 days a week.  Other labs from June included Hgb A1c 5.4 and LDL 60. Routine awake and asleep EEG on 07/14/2020 was normal.  Admitted to hospital in November for acute diverticular bleeding.  Colonoscopy no longer found active bleeding.  She has since restarted Xarelto.  Feeling fatigued.  HISTORY:  On 07/01/2020, the patient experienced a mild headache off and on during the day.  She also had a left temporal headache and noted intermittent numbness and tingling in the bilateral lower extremities.  That evening, she was talking with family when she suddenly started holding the right side of her face, eyes became big and she started drooling.  When her family spoke to her, she could only moan.  She was unable to repeat.  She could not smile but did not exhibit facial droop or unilateral weakness.  After 5-6 minutes, she started speaking but it was slurred and didn't make sense.  She was unable to repeat phrases.  She has no recollection of this.  No convulsions or  incontinence  She was brought to the ED.  CTA of head and neck personally reviewed was negative for large vessel occlusion or hemodynamically significant stenosis.  Unable to have an MRI due to pacemaker.  She has a fib which is treated with Xarelto.     She has remote history of left sided temporal headaches (sometimes radiates to right side).  She has lupus and was on prednisone until February.  Headaches returned about a month later.  She sometimes reports blurred vision.  Sed rate on 06/27/2020 was 58.  PAST MEDICAL HISTORY: Past Medical History:  Diagnosis Date   A-fib (HCC)    Anemia    Cardiomyopathy (HCC)    CHF (congestive heart failure) (HCC)    CKD (chronic kidney disease)    Diverticulosis    Hypertension    Lupus (HCC)    Osteoarthritis    Pulmonary fibrosis (HCC)    Vitamin D deficiency     MEDICATIONS: Current Outpatient Medications on File Prior to Visit  Medication Sig Dispense Refill   acetaminophen (TYLENOL) 500 MG tablet Take 1,000 mg by mouth every 6 (six) hours as needed (pain).     albuterol (VENTOLIN HFA) 108 (90 Base) MCG/ACT inhaler Inhale 2 puffs into the lungs every 6 (six) hours as needed for wheezing or shortness of breath.     allopurinol (ZYLOPRIM) 100 MG tablet Take 1 tablet (100 mg total) by mouth daily. 90 tablet 0   Budeson-Glycopyrrol-Formoterol (BREZTRI AEROSPHERE) 160-9-4.8 MCG/ACT AERO Inhale 2 puffs into the lungs 2 (two) times daily as  needed (shortness of breath).     Calcium Carbonate-Vitamin D (CALTRATE 600+D PO) Take 1 capsule by mouth daily with lunch.     cetirizine HCl (ZYRTEC) 5 MG/5ML SOLN Take 5 mg by mouth at bedtime.     Cholecalciferol 25 MCG (1000 UT) tablet Take 1,000 Units by mouth daily with lunch.     ferrous sulfate 325 (65 FE) MG tablet Take 325 mg by mouth 2 (two) times daily with a meal.     fluticasone (FLONASE) 50 MCG/ACT nasal spray Place 2 sprays into both nostrils daily as needed for allergies.     folic acid (FOLVITE)  Q000111Q MCG tablet Take 800 mcg by mouth daily with lunch.     furosemide (LASIX) 20 MG tablet Take 20 mg by mouth See admin instructions. Take one tablet (20 mg) by mouth every morning, Hold if BP is lower than 110/60     hydroxychloroquine (PLAQUENIL) 200 MG tablet Take 1 tablet (200 mg total) by mouth daily. 90 tablet 0   levothyroxine (SYNTHROID) 25 MCG tablet Take 1 tablet (25 mcg total) by mouth daily. 90 tablet 3   metoprolol succinate (TOPROL-XL) 25 MG 24 hr tablet Take 25 mg by mouth See admin instructions. Take one tablet (25 mg) by mouth every morning, Hold dose if <110 SBP or <60 DBP     Multiple Vitamins-Minerals (MULTIVITAMIN WITH MINERALS) tablet Take 1 tablet by mouth daily with lunch.     Nintedanib (OFEV) 150 MG CAPS Take 150 mg by mouth at bedtime.     polyvinyl alcohol (LIQUIFILM TEARS) 1.4 % ophthalmic solution Place 1 drop into both eyes daily as needed for dry eyes.     predniSONE (DELTASONE) 5 MG tablet Take 1 tablet (5 mg total) by mouth daily with breakfast. Patient to double up dose during sick day rule 100 tablet 3   rivaroxaban (XARELTO) 15 MG TABS tablet Take 1 tablet (15 mg total) by mouth daily with supper. 30 tablet 0   vitamin B-12 (CYANOCOBALAMIN) 1000 MCG tablet Take 1,000 mcg by mouth daily with lunch.     No current facility-administered medications on file prior to visit.    ALLERGIES: Allergies  Allergen Reactions   Propofol Other (See Comments)    Caused asthma   Flecainide Other (See Comments)    Unknown reaction   Amiodarone Other (See Comments)    Delerium/Confusion/Psychosis,    Amlodipine Other (See Comments)    Tired, syncope    FAMILY HISTORY: Family History  Problem Relation Age of Onset   Heart disease Mother    Hypertension Mother    Rheum arthritis Mother    Osteoarthritis Mother    Heart disease Father    Hypertension Father    Osteoarthritis Father    Heart disease Sister    Diabetes Brother    Cancer Brother        Objective:  Blood pressure (!) 141/79, pulse 83, height 5\' 4"  (1.626 m), weight 115 lb (52.2 kg), SpO2 100 %. General: No acute distress.  Patient appears well-groomed.   Head:  Normocephalic/atraumatic Eyes:  Fundi examined but not visualized Neck: supple, no paraspinal tenderness, full range of motion Heart:  Regular rate and rhythm Lungs:  Clear to auscultation bilaterally Back: No paraspinal tenderness Neurological Exam: alert and oriented to person, place, and time.  Speech fluent and not dysarthric, language intact.  CN II-XII intact. Bulk and tone normal, muscle strength 5/5 throughout.  Sensation to light touch intact.  Deep tendon reflexes  1+ throughout.  Finger to nose testing intact.  Gait cautious.  Assisted by cane.  Romberg negative.   Metta Clines, DO  CC: Fredda Hammed, PA-C

## 2021-01-21 ENCOUNTER — Other Ambulatory Visit: Payer: Self-pay

## 2021-01-21 ENCOUNTER — Encounter: Payer: Self-pay | Admitting: Neurology

## 2021-01-21 ENCOUNTER — Ambulatory Visit (INDEPENDENT_AMBULATORY_CARE_PROVIDER_SITE_OTHER): Payer: Medicare HMO | Admitting: Neurology

## 2021-01-21 VITALS — BP 141/79 | HR 83 | Ht 64.0 in | Wt 115.0 lb

## 2021-01-21 DIAGNOSIS — R519 Headache, unspecified: Secondary | ICD-10-CM | POA: Diagnosis not present

## 2021-01-21 DIAGNOSIS — G459 Transient cerebral ischemic attack, unspecified: Secondary | ICD-10-CM

## 2021-01-21 NOTE — Patient Instructions (Signed)
Continue current management Follow up 7 months

## 2021-02-04 ENCOUNTER — Telehealth: Payer: Self-pay | Admitting: Cardiology

## 2021-02-04 NOTE — Telephone Encounter (Signed)
° °  Pt c/o medication issue:  1. Name of Medication:   rivaroxaban (XARELTO) 15 MG TABS tablet    2. How are you currently taking this medication (dosage and times per day)? Take 1 tablet (15 mg total) by mouth daily with supper.  3. Are you having a reaction (difficulty breathing--STAT)?   4. What is your medication issue? Pt's daughter requesting for discount card for xarelto. She said its very expensive

## 2021-02-04 NOTE — Telephone Encounter (Signed)
**Note De-Identified  Obfuscation** I called the pts daughter Lindsey Huber [facility]) and advised her that since her Mom has Medicare there are no discounts cards available other than the Xarelto "new start" free 30 day card that she has already used (It is a one time only benefit).  I did give her [employer] phone number so she can call to see if the pt is eligible for approval in their Xarelto program and/or to ask if there are any other discount options for her.  She is aware that Warfarin is the only generic anticoagulant on the market currently.   I did ask her to call us back if the pt is not eligible for any Xarelto discounts and cannot afford her Xarelto so we can discuss other anticoagulant options for the pt.  Lindsey Huber thanked me for calling her back.

## 2021-02-10 ENCOUNTER — Other Ambulatory Visit: Payer: Self-pay | Admitting: Internal Medicine

## 2021-02-16 ENCOUNTER — Other Ambulatory Visit: Payer: Self-pay | Admitting: Internal Medicine

## 2021-02-17 ENCOUNTER — Ambulatory Visit (INDEPENDENT_AMBULATORY_CARE_PROVIDER_SITE_OTHER): Payer: Medicare HMO

## 2021-02-17 DIAGNOSIS — I429 Cardiomyopathy, unspecified: Secondary | ICD-10-CM

## 2021-02-17 LAB — CUP PACEART REMOTE DEVICE CHECK
Battery Remaining Longevity: 74 mo
Battery Voltage: 2.99 V
Brady Statistic RV Percent Paced: 99.2 %
Date Time Interrogation Session: 20230124001605
HighPow Impedance: 52 Ohm
Implantable Lead Implant Date: 20120920
Implantable Lead Location: 753862
Implantable Pulse Generator Implant Date: 20190809
Lead Channel Impedance Value: 285 Ohm
Lead Channel Impedance Value: 304 Ohm
Lead Channel Pacing Threshold Amplitude: 0.625 V
Lead Channel Pacing Threshold Pulse Width: 0.4 ms
Lead Channel Sensing Intrinsic Amplitude: 8 mV
Lead Channel Sensing Intrinsic Amplitude: 8 mV
Lead Channel Setting Pacing Amplitude: 1.5 V
Lead Channel Setting Pacing Pulse Width: 0.4 ms
Lead Channel Setting Sensing Sensitivity: 0.3 mV

## 2021-02-20 ENCOUNTER — Other Ambulatory Visit: Payer: Self-pay

## 2021-02-20 ENCOUNTER — Encounter: Payer: Self-pay | Admitting: Internal Medicine

## 2021-02-20 ENCOUNTER — Ambulatory Visit (INDEPENDENT_AMBULATORY_CARE_PROVIDER_SITE_OTHER): Payer: Medicare HMO | Admitting: Internal Medicine

## 2021-02-20 VITALS — BP 130/84 | HR 82 | Ht 64.0 in | Wt 122.0 lb

## 2021-02-20 DIAGNOSIS — E042 Nontoxic multinodular goiter: Secondary | ICD-10-CM | POA: Diagnosis not present

## 2021-02-20 DIAGNOSIS — E2749 Other adrenocortical insufficiency: Secondary | ICD-10-CM

## 2021-02-20 DIAGNOSIS — E039 Hypothyroidism, unspecified: Secondary | ICD-10-CM

## 2021-02-20 MED ORDER — PREDNISONE 5 MG PO TABS: 5.0000 mg | ORAL_TABLET | Freq: Every day | ORAL | 3 refills | Status: DC

## 2021-02-20 NOTE — Progress Notes (Signed)
Name: Lindsey Huber  MRN/ DOB: [id number], Jan 14, 1946    Age/ Sex: 76 y.o., female    PCP: Scheryl Marten, [name]   Reason for Endocrinology Evaluation: MNG, adrenal insufficiency      Date of Initial Endocrinology Evaluation: 08/20/2020    HPI: [name] is a 76 y.o. female with a past medical history of MNG, Gout, Adrenal Insufficiency, CHF, gout  and SLE. The patient presented for initial endocrinology clinic visit on 08/20/2020 for consultative assistance with her MNG/ adrenal Insufficiency .     She is accompanied by her Daughter Lindsey Huber , moved from [place]    MNG History:  Has been diagnosed with MNG years ago. She is not sure of thyroid biopsies in the past . Has been on LT-4 replacement daily    No Fh of thyroid disease   Thyroid ultrasound on 08/29/2020 revealing multinodular goiter with a peripherally calcified nodule in the left mid gland meets consensus criteria to consider fine-needle aspiration biopsy.  Per daughter, cardiology has advised against holding Xarelto at this time due to TIA symptoms and we have opted to postpone FNA at this time.  As she also had an EGD on hold  Adrenal History :  She was started on [facility] in 02/2020 following an admission for septic shock . Prior to that she was on Prednisone for years , she was on this though rheumatology for RA   ACTH was at the low end of normal at 6 PG/mL with low normal plasma cortisol at 3.7 UG/DL 07/2020    SUBJECTIVE:    Today (02/20/21):  Lindsey Huber is here for follow-up on multinodular goiter and secondary adrenal insufficiency  Denies local neck swelling  Has occasional  palpitations  Had diarrhea yesterday - otherwise normal bowel movement   Still does not have medical alert bracelet  Has ankle swelling today  Left eye aches , saw ophthalmology in August   HOME ENDOCRINE MEDICATIONS : Prednisone 5 mg daily Levothyroxine 25 mcg daily     HISTORY:  Past Medical History:  Past  Medical History:  Diagnosis Date   A-fib ([place])    Anemia    Cardiomyopathy ([place])    CHF (congestive heart failure) (HCC)    CKD (chronic kidney disease)    Diverticulosis    Hypertension    Lupus ([place])    Osteoarthritis    Pulmonary fibrosis (HCC)    Vitamin D deficiency    Past Surgical History:  Past Surgical History:  Procedure Laterality Date   CARDIAC VALVE SURGERY     CARPAL TUNNEL RELEASE     COLONOSCOPY WITH PROPOFOL N/A 12/20/2020   Procedure: COLONOSCOPY WITH PROPOFOL;  Surgeon: Arta Silence, MD;  Location: [place];  Service: Endoscopy;  Laterality: N/A;   HEMORRHOID SURGERY     PACEMAKER INSERTION     RIGHT/LEFT HEART CATH AND CORONARY ANGIOGRAPHY N/A 11/27/2020   Procedure: RIGHT/LEFT HEART CATH AND CORONARY ANGIOGRAPHY;  Surgeon: Jolaine Artist, MD;  Location: [place] [facility];  Service: Cardiovascular;  Laterality: N/A;   TONSILLECTOMY      Social History:  reports that she has never smoked. She has never used smokeless tobacco. She reports that she does not drink alcohol and does not use drugs. Family History: family history includes Cancer in her brother; Diabetes in her brother; Heart disease in her father, mother, and sister; Hypertension in her father and mother; Osteoarthritis in her father and mother; Rheum arthritis in her mother.  HOME MEDICATIONS: Allergies as of 02/20/2021       Reactions   Propofol Other (See Comments)   Caused asthma   Flecainide Other (See Comments)   Unknown reaction   Amiodarone Other (See Comments)   Delerium/Confusion/Psychosis,    Amlodipine Other (See Comments)   Tired, syncope        Medication List        Accurate as of February 20, 2021  4:09 PM. If you have any questions, ask your nurse or doctor.          acetaminophen 500 MG tablet Commonly known as: TYLENOL Take 1,000 mg by mouth every 6 (six) hours as needed (pain).   albuterol 108 (90 Base) MCG/ACT inhaler Commonly known as: VENTOLIN  HFA Inhale 2 puffs into the lungs every 6 (six) hours as needed for wheezing or shortness of breath.   allopurinol 100 MG tablet Commonly known as: ZYLOPRIM Take 1 tablet (100 mg total) by mouth daily.   Breztri Aerosphere 160-9-4.8 MCG/ACT Aero Generic drug: Budeson-Glycopyrrol-Formoterol Inhale 2 puffs into the lungs 2 (two) times daily as needed (shortness of breath).   CALTRATE 600+D PO Take 1 capsule by mouth daily with lunch.   cetirizine HCl 5 MG/5ML Soln Commonly known as: Zyrtec Take 5 mg by mouth at bedtime.   Cholecalciferol 25 MCG (1000 UT) tablet Take 1,000 Units by mouth daily with lunch.   ferrous sulfate 325 (65 FE) MG tablet Take 325 mg by mouth 2 (two) times daily with a meal.   fluticasone 50 MCG/ACT nasal spray Commonly known as: FLONASE Place 2 sprays into both nostrils daily as needed for allergies.   folic acid Q000111Q MCG tablet Commonly known as: FOLVITE Take 800 mcg by mouth daily with lunch.   furosemide 20 MG tablet Commonly known as: LASIX Take 20 mg by mouth See admin instructions. Take one tablet (20 mg) by mouth every morning, Hold if BP is lower than 110/60   hydroxychloroquine 200 MG tablet Commonly known as: PLAQUENIL Take 1 tablet (200 mg total) by mouth daily.   levothyroxine 25 MCG tablet Commonly known as: SYNTHROID Take 1 tablet (25 mcg total) by mouth daily.   metoprolol succinate 25 MG 24 hr tablet Commonly known as: TOPROL-XL Take 25 mg by mouth See admin instructions. Take one tablet (25 mg) by mouth every morning, Hold dose if <110 SBP or <60 DBP   multivitamin with minerals tablet Take 1 tablet by mouth daily with lunch.   Ofev 150 MG Caps Generic drug: Nintedanib Take 150 mg by mouth at bedtime.   polyvinyl alcohol 1.4 % ophthalmic solution Commonly known as: LIQUIFILM TEARS Place 1 drop into both eyes daily as needed for dry eyes.   predniSONE 5 MG tablet Commonly known as: DELTASONE Take 1 tablet (5 mg total) by  mouth daily with breakfast. Patient to double up dose during sick day rule   Rivaroxaban 15 MG Tabs tablet Commonly known as: XARELTO Take 1 tablet (15 mg total) by mouth daily with supper.   vitamin B-12 1000 MCG tablet Commonly known as: CYANOCOBALAMIN Take 1,000 mcg by mouth daily with lunch.          REVIEW OF SYSTEMS: A comprehensive ROS was conducted with the patient and is negative except as per HPI    OBJECTIVE:  VS: BP 130/84 (BP Location: Left Arm, Patient Position: Sitting, Cuff Size: Small)    Pulse 82    Ht 5\' 4"  (1.626 m)  Wt 122 lb (55.3 kg)    SpO2 99%    BMI 20.94 kg/m    Wt Readings from Last 3 Encounters:  02/20/21 122 lb (55.3 kg)  01/21/21 115 lb (52.2 kg)  01/05/21 117 lb (53.1 kg)     EXAM: General: Pt appears well and is in NAD  Neck: General: Supple without adenopathy. Thyroid: Thyroid size normal.  Right thyroid asymmetry appreciated.   Lungs: Clear with good BS bilat with no rales, rhonchi, or wheezes  Heart: Auscultation: RRR.  Abdomen: Normoactive bowel sounds, soft, nontender, without masses or organomegaly palpable  Extremities:  BL LE: No pretibial edema normal ROM and strength.  Mental Status: Judgment, insight: Intact Orientation: Oriented to time, place, and person Mood and affect: No depression, anxiety, or agitation     DATA REVIEWED:  Latest Reference Range & Units 12/23/20 01:29  Sodium 135 - 145 mmol/L 140  Potassium 3.5 - 5.1 mmol/L 3.9  Chloride 98 - 111 mmol/L 106  CO2 22 - 32 mmol/L 28  Glucose 70 - 99 mg/dL 87  BUN 8 - 23 mg/dL 13  Creatinine 0.44 - 1.00 mg/dL 1.18 (H)  Calcium 8.9 - 10.3 mg/dL 8.6 (L)  Anion gap 5 - 15  6  GFR, Estimated >60 mL/min 49 (L)       Thyroid ultrasound 08/28/2020   Diffusely enlarged, heterogeneous and irregular thyroid gland. The appearance is most consistent with diffuse goitrous change.   Nodule # 2:   Location: Left; Mid   Maximum size: 2.0 cm; Other 2 dimensions:  1.6 x 1.5 cm   Composition: solid/almost completely solid (2)   Echogenicity: cannot determine (1)   Shape: not taller-than-wide (0)   Margins: ill-defined (0)   Echogenic foci: peripheral calcifications (2)   ACR TI-RADS total points: 5.   ACR TI-RADS risk category: TR4 (4-6 points).   ACR TI-RADS recommendations:   **Given size (>/= 1.5 cm) and appearance, fine needle aspiration of this moderately suspicious nodule should be considered based on TI-RADS criteria.   _________________________________________________________   Region of pseudo nodularity in the right mid gland.   IMPRESSION: 1. Diffusely enlarged, heterogeneous and lobular thyroid gland most consistent with diffuse goitrous change. 2. A peripherally calcified nodule in the left mid gland meets consensus criteria to consider fine-needle aspiration biopsy.    ASSESSMENT/PLAN/RECOMMENDATIONS:   Multinodular Goiter :   - No local neck symptoms -No known history of biopsies in the past -Thyroid ultrasound shows left nodule 2 cm, meeting FNA criteria.  Daughter and patient were told not to hold Xarelto by cardiology due to risk of TIA, I spoke to the radiology department on 02/25/2021 and confirmed that FNA can be done with Xarelto on board - They understand risk of thyroid cancer 3-5% and opted to monitor     2. Hypothyroidism:  -Patient with nonspecific symptoms - Pt educated extensively on the correct way to take levothyroxine (first thing in the morning with water, 30 minutes before eating or taking other medications). - Pt encouraged to double dose the following day if she were to miss a dose given long half-life of levothyroxine.  Medication Continue levothyroxine 25 MCG daily   3.  Secondary adrenal insufficiency:   -This is due to chronic use of glucocorticoids for rheumatoid arthritis.  She was on prednisone for years which was switched to hydrocortisone since her hospitalization in  [place] in 02/2020 - -I have advised the patient to obtain a medical alert bracelet -We discussed sick day  will   Medication Stop hydrocortisone Start prednisone 5 mg daily with breakfast    Follow-up in 6 months   Addendum: Spoke to Emerson on 02/25/2021 I have advised her that mother can proceed with FNA of the thyroid nodule without having to hold Xarelto Per radiology recommendations.  She will discuss with mother and let me know    Signed electronically by: Mack Guise, MD  [facility] Endocrinology  [facility] [address] [place], [place] [address] Phone: [phone] FAX: [phone]   CC: Scheryl Marten, [name] [address] [place] [address] Phone: [phone] Fax: [phone]   Return to Endocrinology clinic as below: Future Appointments  Date Time Provider [place]  04/09/2021 10:40 AM Collier Salina, MD [facility] None  04/23/2021  3:20 PM Bensimhon, Shaune Pascal, MD [facility] None  05/19/2021  7:00 AM [facility] DEVICE REMOTES [facility]  08/18/2021  7:00 AM [facility] DEVICE REMOTES [facility]  08/21/2021  2:40 PM Akita Maxim, Melanie Crazier, MD [facility] None  08/21/2021  3:30 PM Pieter Partridge, DO [facility] None  11/17/2021  7:00 AM [facility] DEVICE REMOTES [facility]

## 2021-02-20 NOTE — Patient Instructions (Addendum)
°-   Prednisone 5 mg 1 tablet EVERY morning with Breakfast  - Continue Levothyroxine 25 mcg daily   ADRENAL INSUFFICIENCY SICK DAY RULES:  Should you face an extreme emotional or physical stress such as trauma, surgery or acute illness, this will require extra steroid coverage so that the body can meet that stress.   Without increasing the steroid dose you may experience severe weakness, headache, dizziness, nausea and vomiting and possibly a more serious deterioration in health.  Typically the dose of steroids will only need to be increased for a couple of days if you have an illness that is transient and managed in the community.   If you are unable to take/absorb an increased dose of steroids orally because of vomiting or diarrhea, you will urgently require steroid injections and should present to an Emergency Department.  The general advice for any serious illness is as follows: Double the normal daily steroid dose for up to 3 days if you have a temperature of more than 37.50C (99.21F) with signs of sickness, or severe emotional or physical distress Contact your primary care doctor and Endocrinologist if the illness worsens or it lasts for more than 3 days.  In cases of severe illness, urgent medical assistance should be promptly sought. If you experience vomiting/diarrhea or are unable to take steroids by mouth, please administer the Hydrocortisone injection kit and seek urgent medical help.

## 2021-02-21 LAB — BASIC METABOLIC PANEL
BUN/Creatinine Ratio: 26 (ref 12–28)
BUN: 24 mg/dL (ref 8–27)
CO2: 24 mmol/L (ref 20–29)
Calcium: 9.2 mg/dL (ref 8.7–10.3)
Chloride: 106 mmol/L (ref 96–106)
Creatinine, Ser: 0.94 mg/dL (ref 0.57–1.00)
Glucose: 99 mg/dL (ref 70–99)
Potassium: 3.6 mmol/L (ref 3.5–5.2)
Sodium: 145 mmol/L — ABNORMAL HIGH (ref 134–144)
eGFR: 64 mL/min/{1.73_m2} (ref >59)

## 2021-02-21 LAB — TSH: TSH: 1.47 u[IU]/mL (ref 0.450–4.500)

## 2021-02-25 ENCOUNTER — Other Ambulatory Visit: Payer: Self-pay | Admitting: Internal Medicine

## 2021-02-25 DIAGNOSIS — M1A09X0 Idiopathic chronic gout, multiple sites, without tophus (tophi): Secondary | ICD-10-CM

## 2021-02-25 MED ORDER — LEVOTHYROXINE SODIUM 25 MCG PO TABS: 25.0000 ug | ORAL_TABLET | Freq: Every day | ORAL | 3 refills | Status: DC

## 2021-02-27 NOTE — Progress Notes (Signed)
Remote ICD transmission.   

## 2021-03-04 ENCOUNTER — Encounter: Payer: Self-pay | Admitting: Internal Medicine

## 2021-03-14 ENCOUNTER — Encounter (HOSPITAL_COMMUNITY): Payer: Self-pay

## 2021-03-14 ENCOUNTER — Emergency Department (HOSPITAL_COMMUNITY): Payer: Medicare HMO

## 2021-03-14 ENCOUNTER — Other Ambulatory Visit: Payer: Self-pay

## 2021-03-14 ENCOUNTER — Emergency Department (HOSPITAL_COMMUNITY)
Admission: EM | Admit: 2021-03-14 | Discharge: 2021-03-14 | Disposition: A | Discharge status: Home / Self Care | Payer: Medicare HMO | Attending: Emergency Medicine | Admitting: Emergency Medicine

## 2021-03-14 DIAGNOSIS — W19XXXA Unspecified fall, initial encounter: Secondary | ICD-10-CM

## 2021-03-14 DIAGNOSIS — Z8673 Personal history of transient ischemic attack (TIA), and cerebral infarction without residual deficits: Secondary | ICD-10-CM | POA: Insufficient documentation

## 2021-03-14 DIAGNOSIS — I4891 Unspecified atrial fibrillation: Secondary | ICD-10-CM | POA: Insufficient documentation

## 2021-03-14 DIAGNOSIS — Z79899 Other long term (current) drug therapy: Secondary | ICD-10-CM | POA: Diagnosis not present

## 2021-03-14 DIAGNOSIS — I130 Hypertensive heart and chronic kidney disease with heart failure and stage 1 through stage 4 chronic kidney disease, or unspecified chronic kidney disease: Secondary | ICD-10-CM | POA: Diagnosis not present

## 2021-03-14 DIAGNOSIS — Z20822 Contact with and (suspected) exposure to covid-19: Secondary | ICD-10-CM | POA: Insufficient documentation

## 2021-03-14 DIAGNOSIS — L539 Erythematous condition, unspecified: Secondary | ICD-10-CM | POA: Diagnosis not present

## 2021-03-14 DIAGNOSIS — Z7901 Long term (current) use of anticoagulants: Secondary | ICD-10-CM | POA: Insufficient documentation

## 2021-03-14 DIAGNOSIS — I509 Heart failure, unspecified: Secondary | ICD-10-CM | POA: Insufficient documentation

## 2021-03-14 DIAGNOSIS — L03114 Cellulitis of left upper limb: Secondary | ICD-10-CM | POA: Diagnosis present

## 2021-03-14 DIAGNOSIS — N189 Chronic kidney disease, unspecified: Secondary | ICD-10-CM | POA: Diagnosis not present

## 2021-03-14 DIAGNOSIS — R404 Transient alteration of awareness: Secondary | ICD-10-CM

## 2021-03-14 LAB — CBC WITH DIFFERENTIAL/PLATELET
Abs Immature Granulocytes: 0.02 10*3/uL (ref 0.00–0.07)
Basophils Absolute: 0 10*3/uL (ref 0.0–0.1)
Basophils Relative: 1 %
Eosinophils Absolute: 0.1 10*3/uL (ref 0.0–0.5)
Eosinophils Relative: 2 %
HCT: 32.8 % — ABNORMAL LOW (ref 36.0–46.0)
Hemoglobin: 10.6 g/dL — ABNORMAL LOW (ref 12.0–15.0)
Immature Granulocytes: 0 %
Lymphocytes Relative: 27 %
Lymphs Abs: 1.7 10*3/uL (ref 0.7–4.0)
MCH: 32.1 pg (ref 26.0–34.0)
MCHC: 32.3 g/dL (ref 30.0–36.0)
MCV: 99.4 fL (ref 80.0–100.0)
Monocytes Absolute: 0.6 10*3/uL (ref 0.1–1.0)
Monocytes Relative: 9 %
Neutro Abs: 4 10*3/uL (ref 1.7–7.7)
Neutrophils Relative %: 61 %
Platelets: 144 10*3/uL — ABNORMAL LOW (ref 150–400)
RBC: 3.3 MIL/uL — ABNORMAL LOW (ref 3.87–5.11)
RDW: 14.9 % (ref 11.5–15.5)
WBC: 6.5 10*3/uL (ref 4.0–10.5)
nRBC: 0 % (ref 0.0–0.2)

## 2021-03-14 LAB — URINALYSIS, ROUTINE W REFLEX MICROSCOPIC
Bilirubin Urine: NEGATIVE
Glucose, UA: NEGATIVE mg/dL
Hgb urine dipstick: NEGATIVE
Ketones, ur: NEGATIVE mg/dL
Leukocytes,Ua: NEGATIVE
Nitrite: NEGATIVE
Protein, ur: NEGATIVE mg/dL
Specific Gravity, Urine: 1.005 (ref 1.005–1.030)
pH: 6 (ref 5.0–8.0)

## 2021-03-14 LAB — PROTIME-INR
INR: 2 — ABNORMAL HIGH (ref 0.8–1.2)
Prothrombin Time: 22.8 seconds — ABNORMAL HIGH (ref 11.4–15.2)

## 2021-03-14 LAB — MAGNESIUM: Magnesium: 1.8 mg/dL (ref 1.7–2.4)

## 2021-03-14 LAB — COMPREHENSIVE METABOLIC PANEL
ALT: 17 U/L (ref 0–44)
AST: 32 U/L (ref 15–41)
Albumin: 3.1 g/dL — ABNORMAL LOW (ref 3.5–5.0)
Alkaline Phosphatase: 40 U/L (ref 38–126)
Anion gap: 9 (ref 5–15)
BUN: 26 mg/dL — ABNORMAL HIGH (ref 8–23)
CO2: 28 mmol/L (ref 22–32)
Calcium: 9.2 mg/dL (ref 8.9–10.3)
Chloride: 103 mmol/L (ref 98–111)
Creatinine, Ser: 0.92 mg/dL (ref 0.44–1.00)
GFR, Estimated: >60 mL/min (ref >60)
Glucose, Bld: 89 mg/dL (ref 70–99)
Potassium: 3.7 mmol/L (ref 3.5–5.1)
Sodium: 140 mmol/L (ref 135–145)
Total Bilirubin: 0.5 mg/dL (ref 0.3–1.2)
Total Protein: 7.5 g/dL (ref 6.5–8.1)

## 2021-03-14 LAB — RESP PANEL BY RT-PCR (FLU A&B, COVID) ARPGX2
Influenza A by PCR: NEGATIVE
Influenza B by PCR: NEGATIVE
SARS Coronavirus 2 by RT PCR: NEGATIVE

## 2021-03-14 MED ORDER — CEPHALEXIN 500 MG PO CAPS: 500.0000 mg | ORAL_CAPSULE | Freq: Three times a day (TID) | ORAL | 0 refills | Status: AC

## 2021-03-14 NOTE — ED Triage Notes (Signed)
Pt BIB [facility] from home d/t possible TIA. Per [facility], this morning pt had intermittent change in her speech, hard to get speech out and abnormal conversation. Had an episode of staring off space not answering her daughter. A&O X4 upon EMS arrival, however still had hard time getting speech out which have all resolved upon EMS arrival.

## 2021-03-14 NOTE — Discharge Instructions (Signed)
There is a prescription attached for Keflex.  This is an antibiotic to treat cellulitis.  Take as prescribed.  The area of redness on your left forearm should improve.  If it does not, please return to the emergency department.  If you have any other concerning neurologic symptoms, please return to the emergency department, otherwise you can follow-up with [facility] neurology.  Contact information is below.  Also, please follow-up with your primary care doctor to inform her of your recent symptoms and you are ER visit.

## 2021-03-14 NOTE — ED Provider Notes (Signed)
[facility] EMERGENCY DEPARTMENT Provider Note   CSN: [id number] Arrival date & time: 03/14/21  1217     History  No chief complaint on file.   Lindsey Huber is a 76 y.o. female.  HPI Patient presents for transient word finding difficulty.  She denies any history of stroke.  She states that she has had a previous TIA x2.  She is currently on Xarelto for atrial fibrillation.  Last dose was this morning.  Patient was in her normal state of health prior to today.  This morning, her daughter found her sitting on the bed staring off into space.  There were family concerns of patient not acting like herself.  Family does feel like she may have some early dementia sitting in.  She reportedly brought up a very strange conversation about cigarettes which is out of character for her.  She was also noticed to have difficulty with word finding.  This word finding difficulty was also noticed with EMS on scene.  This resolved during transit to the hospital.  Patient was not noted to have any other neurologic deficits with EMS.  Currently, patient states that she feels totally normal.  Recent history also includes a fall 2 days ago at the grocery store.  During this fall, patient did not strike her head.  She struck her left arm and has had soreness to her distal left arm since that fall.  At baseline, patient walks with a cane.  She states that she was walking earlier today.    Home Medications Prior to Admission medications   Medication Sig Start Date End Date Taking? Authorizing Provider  cephALEXin (KEFLEX) 500 MG capsule Take 1 capsule (500 mg total) by mouth 3 (three) times daily for 5 days. 03/14/21 03/19/21 Yes Godfrey Pick, MD  acetaminophen (TYLENOL) 500 MG tablet Take 1,000 mg by mouth every 6 (six) hours as needed (pain).    [provider]  albuterol (VENTOLIN HFA) 108 (90 Base) MCG/ACT inhaler Inhale 2 puffs into the lungs every 6 (six) hours as needed for wheezing  or shortness of breath.    [provider]  allopurinol (ZYLOPRIM) 100 MG tablet TAKE 1 TABLET BY MOUTH EVERY DAY 02/25/21   Rice, Resa Miner, MD  Budeson-Glycopyrrol-Formoterol (BREZTRI AEROSPHERE) 160-9-4.8 MCG/ACT AERO Inhale 2 puffs into the lungs 2 (two) times daily as needed (shortness of breath).    [provider]  Calcium Carbonate-Vitamin D (CALTRATE 600+D PO) Take 1 capsule by mouth daily with lunch.    [provider]  cetirizine HCl (ZYRTEC) 5 MG/5ML SOLN Take 5 mg by mouth at bedtime.    [provider]  Cholecalciferol 25 MCG (1000 UT) tablet Take 1,000 Units by mouth daily with lunch.    [provider]  ferrous sulfate 325 (65 FE) MG tablet Take 325 mg by mouth 2 (two) times daily with a meal.    [provider]  fluticasone (FLONASE) 50 MCG/ACT nasal spray Place 2 sprays into both nostrils daily as needed for allergies.    [provider]  folic acid (FOLVITE) Q000111Q MCG tablet Take 800 mcg by mouth daily with lunch.    [provider]  furosemide (LASIX) 20 MG tablet Take 20 mg by mouth See admin instructions. Take one tablet (20 mg) by mouth every morning, Hold if BP is lower than 110/60    [provider]  hydroxychloroquine (PLAQUENIL) 200 MG tablet Take 1 tablet (200 mg total) by mouth daily. 01/05/21  Collier Salina, MD  levothyroxine (SYNTHROID) 25 MCG tablet Take 1 tablet (25 mcg total) by mouth daily. 02/25/21   Shamleffer, Melanie Crazier, MD  metoprolol succinate (TOPROL-XL) 25 MG 24 hr tablet Take 25 mg by mouth See admin instructions. Take one tablet (25 mg) by mouth every morning, Hold dose if <110 SBP or <60 DBP 06/18/20   [provider]  Multiple Vitamins-Minerals (MULTIVITAMIN WITH MINERALS) tablet Take 1 tablet by mouth daily with lunch.    [provider]  Nintedanib (OFEV) 150 MG CAPS Take 150 mg by mouth at bedtime.    [provider]  polyvinyl alcohol  (LIQUIFILM TEARS) 1.4 % ophthalmic solution Place 1 drop into both eyes daily as needed for dry eyes.    [provider]  predniSONE (DELTASONE) 5 MG tablet Take 1 tablet (5 mg total) by mouth daily with breakfast. Patient to double up dose during sick day rule 02/20/21   Shamleffer, Melanie Crazier, MD  rivaroxaban (XARELTO) 15 MG TABS tablet Take 1 tablet (15 mg total) by mouth daily with supper. 12/24/20   Domenic Polite, MD  vitamin B-12 (CYANOCOBALAMIN) 1000 MCG tablet Take 1,000 mcg by mouth daily with lunch.    [provider]      Allergies    Propofol, Flecainide, Amiodarone, and Amlodipine    Review of Systems   Review of Systems  Neurological:  Positive for speech difficulty.  All other systems reviewed and are negative.  Physical Exam Updated Vital Signs BP 123/66    Pulse 69    Temp 97.6 F (36.4 C) (Oral)    Resp 20    SpO2 96%  Physical Exam Vitals and nursing note reviewed.  Constitutional:      General: She is not in acute distress.    Appearance: Normal appearance. She is well-developed.  HENT:     Head: Normocephalic and atraumatic.     Left Ear: External ear normal.     Nose: Nose normal.     Mouth/Throat:     Mouth: Mucous membranes are moist.     Pharynx: Oropharynx is clear.  Eyes:     General: No scleral icterus.    Extraocular Movements: Extraocular movements intact.     Conjunctiva/sclera: Conjunctivae normal.  Cardiovascular:     Rate and Rhythm: Normal rate.  Pulmonary:     Effort: Pulmonary effort is normal. No respiratory distress.  Abdominal:     General: Abdomen is flat.     Palpations: Abdomen is soft.     Tenderness: There is no abdominal tenderness.  Musculoskeletal:        General: No swelling or deformity. Normal range of motion.     Cervical back: Normal range of motion and neck supple. No rigidity.     Right lower leg: No edema.     Left lower leg: No edema.  Skin:    General: Skin is warm and dry.     Capillary  Refill: Capillary refill takes less than 2 seconds.     Findings: Erythema present.     Comments: Warmth and erythema on the medial aspect of left forearm  Neurological:     General: No focal deficit present.     Mental Status: She is alert and oriented to person, place, and time.     Cranial Nerves: No cranial nerve deficit.     Sensory: No sensory deficit.     Motor: No weakness.     Coordination: Coordination normal.  Psychiatric:  Mood and Affect: Mood normal.        Behavior: Behavior normal.        Thought Content: Thought content normal.        Judgment: Judgment normal.    ED Results / Procedures / Treatments   Labs (all labs ordered are listed, but only abnormal results are displayed) Labs Reviewed  PROTIME-INR - Abnormal; Notable for the following components:      Result Value   Prothrombin Time 22.8 (*)    INR 2.0 (*)    All other components within normal limits  URINALYSIS, ROUTINE W REFLEX MICROSCOPIC - Abnormal; Notable for the following components:   Color, Urine COLORLESS (*)    All other components within normal limits  COMPREHENSIVE METABOLIC PANEL - Abnormal; Notable for the following components:   BUN 26 (*)    Albumin 3.1 (*)    All other components within normal limits  CBC WITH DIFFERENTIAL/PLATELET - Abnormal; Notable for the following components:   RBC 3.30 (*)    Hemoglobin 10.6 (*)    HCT 32.8 (*)    Platelets 144 (*)    All other components within normal limits  RESP PANEL BY RT-PCR (FLU A&B, COVID) [id number]  MAGNESIUM    EKG EKG Interpretation  Date/Time:  Saturday March 14 2021 12:31:28 EST Ventricular Rate:  70 PR Interval:    QRS Duration: 150 QT Interval:  478 QTC Calculation: 516 R Axis:   261 Text Interpretation: VENTRICULAR PACING Confirmed by Godfrey Pick [phone]) on 03/14/2021 2:45:55 PM  Radiology DG Elbow 2 Views Left  Result Date: 03/14/2021 CLINICAL DATA:  Crush injury.  Elbow pain. EXAM: LEFT ELBOW - 2 VIEW  COMPARISON:  None. FINDINGS: There is no evidence of fracture, dislocation, or joint effusion. There is no evidence of arthropathy or other focal bone abnormality. Soft tissues are unremarkable except for vascular calcification. IMPRESSION: Negative. Electronically Signed   By: Nelson Chimes M.D.   On: 03/14/2021 19:44   DG Forearm Left  Result Date: 03/14/2021 CLINICAL DATA:  Fall with left arm pain. EXAM: LEFT FOREARM - 2 VIEW COMPARISON:  None. FINDINGS: There is no evidence of fracture or other focal bone lesions. Degenerative change about the wrist. There are advanced vascular calcifications. Soft tissue edema is present about the proximal ulnar aspect of the forearm. IMPRESSION: Soft tissue edema without acute forearm fracture. Electronically Signed   By: Keith Rake M.D.   On: 03/14/2021 19:44   CT Head Wo Contrast  Result Date: 03/14/2021 CLINICAL DATA:  TIA EXAM: CT HEAD WITHOUT CONTRAST TECHNIQUE: Contiguous axial images were obtained from the base of the skull through the vertex without intravenous contrast. RADIATION DOSE REDUCTION: This exam was performed according to the departmental dose-optimization program which includes automated exposure control, adjustment of the mA and/or kV according to patient size and/or use of iterative reconstruction technique. COMPARISON:  07/10/2020 FINDINGS: Brain: No acute intracranial findings are seen. There are no signs of recent bleeding. There are no epidural or subdural fluid collections. Coarse calcifications are seen in the basal ganglia on both sides. Cortical sulci are prominent. Vascular: Unremarkable. Skull: Unremarkable. Sinuses/Orbits: Unremarkable. Other: There are numerous small calcifications in both parotid salivary glands. 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 IMPRESSION: No acute intracranial findings are seen in noncontrast CT brain. Atrophy. No significant interval changes are noted. Electronically Signed   By: Elmer Picker M.D.    On: 03/14/2021 12:57    Procedures Procedures    Medications Ordered in  ED Medications - No data to display  ED Course/ Medical Decision Making/ A&P                           Medical Decision Making Amount and/or Complexity of Data Reviewed Labs: ordered. Radiology: ordered.  Risk Prescription drug management.   This patient presents to the ED for concern of word finding difficulty, this involves an extensive number of treatment options, and is a complaint that carries with it a high risk of complications and morbidity.  The differential diagnosis includes CVA, TIA, infection, metabolic abnormality, polypharmacy   Co morbidities that complicate the patient evaluation  HTN, GERD, IBS, SLE, atrial fibrillation, CHF, CKD, anemia   Additional history obtained:  Additional history obtained from EMS External records from outside source obtained and reviewed including EMR   Lab Tests:  I Ordered, and personally interpreted labs.  The pertinent results include: Baseline anemia, no leukocytosis, normal electrolytes   Imaging Studies ordered:  I ordered imaging studies including CT head, LUE x-rays I independently visualized and interpreted imaging which showed CT head showed no acute findings.  X-ray imaging of distal left upper extremity showed soft tissue edema only. I agree with the radiologist interpretation   Cardiac Monitoring:  The patient was maintained on a cardiac monitor.  I personally viewed and interpreted the cardiac monitored which showed an underlying rhythm of: Sinus rhythm  Consultations Obtained:  I requested consultation with the neurologist,  and discussed lab and imaging findings as well as pertinent plan - they recommend: MRI, if possible.  If MRI is not possible or if results show no acute findings, patient can be discharged with plans to continue Xarelto and follow-up outpatient.   Problem List / ED Course:  Pleasant 75 year old female  presenting from home by EMS due to concerns of word finding difficulty.  This difficulty was noted by family this morning and was also noted by EMS on scene.  Symptoms resolved during transit to the hospital.  On arrival, patient is fully alert and oriented.  She does not have any difficulty finding her words.  She has no focal neurologic deficits on exam.  Physical exam is also notable for erythema, tenderness, and warmth to an area of her medial left forearm.  She states that this was an area that she fell onto 2 days ago.  Findings are more consistent with cellulitis than with bruising.  Area of bruising is also not consistent with a fall.  She does take Xarelto and does state that she took her dose this morning.  Per chart review, patient has been seen by neurology in the past for concerns of TIA.  She was last seen in clinic on 12/28.  Last suspected TIA was June of last year.  At that time, she underwent CT head, and CTA of head and neck.  She followed up with neurology following that visit.  She did undergo an EEG which was negative.  Patient's early work-up was reassuring.  There were no acute findings identified on noncontrasted CT scan of head.  Lab work was also unremarkable.  I did consult neurology who recommended MRI if possible.  MRI was ordered.  MRI was contacted to find out if a MRI study would be possible given the presence of her [employer] pacemaker.  MRI stated that this would not be possible on the weekend.  Patient was informed of this.  Patient does feel comfortable with discharge home.  Given concern of cellulitis, patient to be discharged with Keflex.  She was advised to follow-up with neurology outpatient and to return to the emergency department for any further concerning symptoms.   Reevaluation:  After the interventions noted above, I reevaluated the patient and found that they have :stayed the same   Social Determinants of Health:  Has family support.  Has access to  outpatient care.   Dispostion:  After consideration of the diagnostic results and the patients response to treatment, I feel that the patent would benefit from discharge with close follow-up.          Final Clinical Impression(s) / ED Diagnoses Final diagnoses:  Fall  Left arm cellulitis  Transient alteration of awareness    Rx / DC Orders ED Discharge Orders          Ordered    cephALEXin (KEFLEX) 500 MG capsule  3 times daily        03/14/21 2035              Godfrey Pick, MD 03/14/21 2037

## 2021-03-16 ENCOUNTER — Other Ambulatory Visit: Payer: Self-pay | Admitting: Internal Medicine

## 2021-03-16 DIAGNOSIS — Z1231 Encounter for screening mammogram for malignant neoplasm of breast: Secondary | ICD-10-CM

## 2021-03-17 NOTE — Progress Notes (Deleted)
NEUROLOGY FOLLOW UP OFFICE NOTE  Lindsey Huber [id number]  Assessment/Plan:    Transient neurologic symptoms - Probable transient ischemic attack.   Left temporal headache - Giant cell arteritis not suspected.  Sed rate only mildly elevated and CRP normal. A fib Hypertension   Secondary stroke prevention as managed by PCP/cardiology: Xarelto Normotensive blood pressure LDL goal less than 70 Hgb A1c goal less than 7 If headaches do not continue to improve, consider gabapentin or antidepressant. Follow up 7 months.     Subjective:  Lindsey Huber is a 76 year old right-handed female with a fib, cardiomyopathy, HTN, CHF, lupus, pulmonary fibrosis and CKD who follows up for TIA.   UPDATE: Current medications:  Xarelto, Toprol-XL, furosemide,   Confused speech, trying to put together sentences and did not make sense, staring, frozen and head jerking, making a moaning sound and both hands shaking - lasting 2-3 minutes.  Speech started 9:30 and tapered off and then started again 10:30 - then episode  Speech slurred, trouble coming out sentences remembers staring and trying to talk.     On the morning of 03/14/2021, she was noted by her daughter to be acting strange.  She found her sitting on the bed and staring off.  She made unusual conversation about cigarettes.  She also exhibited word-finding difficulty.  No lateralizing symptoms such as facial droop or unilateral limb weakness.  EMS was called and symptoms resolved en route to the ED.  2 days prior, she had a fall and landed on her left arm.  CT head personally reviewed revealed no acute intracranial abnormality.  She was found to have cellulitis on her left medial forearm where she had fallen.  Labs revealed no leukocytes and electrolytes normal.  She was discharged on Keflex.  ***  Last year cognitive deficits -seeing things or hearing things - seeing spiderweb thinking someone put there. Hear people talking outside window -   lives with daughter - paranoid delusions - cousin calling radio station and talking about her - talking in house is being broadcast through building -    B12 from November is 1,406.  TSH from January was 1.470  HISTORY:  On 07/01/2020, the patient experienced a mild headache off and on during the day.  She also had a left temporal headache and noted intermittent numbness and tingling in the bilateral lower extremities.  That evening, she was talking with family when she suddenly started holding the right side of her face, eyes became big and she started drooling.  When her family spoke to her, she could only moan.  She was unable to repeat.  She could not smile but did not exhibit facial droop or unilateral weakness.  After 5-6 minutes, she started speaking but it was slurred and didn't make sense.  She was unable to repeat phrases.  She has no recollection of this.  No convulsions or incontinence  She was brought to the ED.  CTA of head and neck personally reviewed was negative for large vessel occlusion or hemodynamically significant stenosis.  Unable to have an MRI due to pacemaker.  She has a fib which is treated with Xarelto.  Routine awake and asleep EEG on 07/14/2020 was normal.   She has remote history of left sided temporal headaches (sometimes radiates to right side).  She has lupus and was on prednisone until February.  Headaches returned about a month later.  She sometimes reports blurred vision.  Sed rate in June was 58 and CRP  was 1.2.  PAST MEDICAL HISTORY: Past Medical History:  Diagnosis Date   A-fib ([place])    Anemia    Cardiomyopathy ([place])    CHF (congestive heart failure) ([facility])    CKD (chronic kidney disease)    Diverticulosis    Hypertension    Lupus ([facility])    Osteoarthritis    Pulmonary fibrosis ([facility])    Vitamin D deficiency     MEDICATIONS: Current Outpatient Medications on File Prior to Visit  Medication Sig Dispense Refill   acetaminophen (TYLENOL) 500 MG tablet Take  1,000 mg by mouth every 6 (six) hours as needed (pain).     albuterol (VENTOLIN HFA) 108 (90 Base) MCG/ACT inhaler Inhale 2 puffs into the lungs every 6 (six) hours as needed for wheezing or shortness of breath.     allopurinol (ZYLOPRIM) 100 MG tablet TAKE 1 TABLET BY MOUTH EVERY DAY 30 tablet 2   Budeson-Glycopyrrol-Formoterol (BREZTRI AEROSPHERE) 160-9-4.8 MCG/ACT AERO Inhale 2 puffs into the lungs 2 (two) times daily as needed (shortness of breath).     Calcium Carbonate-Vitamin D (CALTRATE 600+D PO) Take 1 capsule by mouth daily with lunch.     cephALEXin (KEFLEX) 500 MG capsule Take 1 capsule (500 mg total) by mouth 3 (three) times daily for 5 days. 15 capsule 0   cetirizine HCl (ZYRTEC) 5 MG/5ML SOLN Take 5 mg by mouth at bedtime.     Cholecalciferol 25 MCG (1000 UT) tablet Take 1,000 Units by mouth daily with lunch.     ferrous sulfate 325 (65 FE) MG tablet Take 325 mg by mouth 2 (two) times daily with a meal.     fluticasone (FLONASE) 50 MCG/ACT nasal spray Place 2 sprays into both nostrils daily as needed for allergies.     folic acid (FOLVITE) Q000111Q MCG tablet Take 800 mcg by mouth daily with lunch.     furosemide (LASIX) 20 MG tablet Take 20 mg by mouth See admin instructions. Take one tablet (20 mg) by mouth every morning, Hold if BP is lower than 110/60     hydroxychloroquine (PLAQUENIL) 200 MG tablet Take 1 tablet (200 mg total) by mouth daily. 90 tablet 0   levothyroxine (SYNTHROID) 25 MCG tablet Take 1 tablet (25 mcg total) by mouth daily. 90 tablet 3   metoprolol succinate (TOPROL-XL) 25 MG 24 hr tablet Take 25 mg by mouth See admin instructions. Take one tablet (25 mg) by mouth every morning, Hold dose if <110 SBP or <60 DBP     Multiple Vitamins-Minerals (MULTIVITAMIN WITH MINERALS) tablet Take 1 tablet by mouth daily with lunch.     Nintedanib (OFEV) 150 MG CAPS Take 150 mg by mouth at bedtime.     polyvinyl alcohol (LIQUIFILM TEARS) 1.4 % ophthalmic solution Place 1 drop into  both eyes daily as needed for dry eyes.     predniSONE (DELTASONE) 5 MG tablet Take 1 tablet (5 mg total) by mouth daily with breakfast. Patient to double up dose during sick day rule 100 tablet 3   rivaroxaban (XARELTO) 15 MG TABS tablet Take 1 tablet (15 mg total) by mouth daily with supper. 30 tablet 0   vitamin B-12 (CYANOCOBALAMIN) 1000 MCG tablet Take 1,000 mcg by mouth daily with lunch.     No current facility-administered medications on file prior to visit.    ALLERGIES: Allergies  Allergen Reactions   Propofol Other (See Comments)    Caused asthma   Flecainide Other (See Comments)    Unknown reaction  Amiodarone Other (See Comments)    Delerium/Confusion/Psychosis,    Amlodipine Other (See Comments)    Tired, syncope    FAMILY HISTORY: Family History  Problem Relation Age of Onset   Heart disease Mother    Hypertension Mother    Rheum arthritis Mother    Osteoarthritis Mother    Heart disease Father    Hypertension Father    Osteoarthritis Father    Heart disease Sister    Diabetes Brother    Cancer Brother       Objective:  *** General: No acute distress.  Patient appears ***-groomed.   Head:  Normocephalic/atraumatic Eyes:  Fundi examined but not visualized Neck: supple, no paraspinal tenderness, full range of motion Heart:  Regular rate and rhythm Lungs:  Clear to auscultation bilaterally Back: No paraspinal tenderness Neurological Exam: alert and oriented to person, place, and time.  Speech fluent and not dysarthric, language intact.  CN II-XII intact. Bulk and tone normal, muscle strength 5/5 throughout.  Sensation to light touch intact.  Deep tendon reflexes 2+ throughout, toes downgoing.  Finger to nose testing intact.  Gait normal, Romberg negative.   Metta Clines, DO  CC: ***

## 2021-03-18 ENCOUNTER — Other Ambulatory Visit: Payer: Self-pay

## 2021-03-18 ENCOUNTER — Ambulatory Visit (INDEPENDENT_AMBULATORY_CARE_PROVIDER_SITE_OTHER): Payer: Medicare HMO | Admitting: Neurology

## 2021-03-18 ENCOUNTER — Encounter: Payer: Self-pay | Admitting: Neurology

## 2021-03-18 VITALS — BP 117/79 | HR 65 | Ht 64.0 in | Wt 122.0 lb

## 2021-03-18 DIAGNOSIS — R4189 Other symptoms and signs involving cognitive functions and awareness: Secondary | ICD-10-CM | POA: Diagnosis not present

## 2021-03-18 DIAGNOSIS — R404 Transient alteration of awareness: Secondary | ICD-10-CM

## 2021-03-18 NOTE — Patient Instructions (Addendum)
24 hour ambulatory EEG MRI of brain without contrast - coordinate with [employer]  Neurocognitive evaluation Follow up 4 months

## 2021-03-18 NOTE — Progress Notes (Signed)
NEUROLOGY FOLLOW UP OFFICE NOTE  Obdulia Granquist [id number]  Assessment/Plan:    Transient neurologic symptoms - No lateralizing signs and given that presentation is similar to prior episode in June, consider complex partial seizure Neurocognitive disorder with behavioral changes - suspect underling dementia.     As these spells are not frequent (only two in 8 months), will defer starting an AED now and first finish workup. Will order MRI of brain and coordinate with [employer]  24 hour ambulatory EEG Neuropsychological evaluation Further recommendations pending results.     Subjective:  Diera Walsingham is a 76 year old right-handed female with a fib, cardiomyopathy, HTN, CHF, lupus, pulmonary fibrosis and CKD who follows up for TIA.   UPDATE: On the morning of 03/14/2021, she was noted by her daughter to be acting strange.  When she spoke, she had trouble putting words together and did not make sense.  After about an hour, she became unresponsive, started staring off, making moaning sounds and exhibited head twitching and shaking of both hands.  Lasted 2-3 minutes.  No incontinence or tongue biting.  She continued having some slurred speech and difficulty getting words out afterwards.  In hindsight, she says that she does remember feeling strange during this event and struggling to talk.  No lateralizing symptoms such as facial droop or unilateral limb weakness.  EMS was called and symptoms resolved en route to the ED.  2 days prior, she had a fall and landed on her left arm.  CT head personally reviewed revealed no acute intracranial abnormality.  MRI was unable to be performed because there were no [employer] reps that day who could come to the hospital to turn off the pacemaker.  She was found to have cellulitis on her left medial forearm where she had fallen.  Labs revealed no leukocytes and electrolytes normal.  She was discharged on Keflex.  No recurrent spells since then.  Over the  last year, her daughter has been concerned about dementia.  She has been having both visual and auditory hallucinations.  For example, she saw a large spider web in her house that she said was put up by somebody.   She sometimes hears people outside the window.  She also may exhibit paranoid delusions such as thinking her cousin is calling into radio stations and talking about her, or that her conversations are being broadcast throughout the apartment complex.  This isn't a frequent occurrence.  She lives with her granddaughter and her daughter stops by everyday.  She is able to perform ADLs independently.  No family history of dementia.    B12 from November is 1,406.  TSH from January was 1.470  HISTORY:  On 07/01/2020, the patient experienced a mild headache off and on during the day.  She also had a left temporal headache and noted intermittent numbness and tingling in the bilateral lower extremities.  That evening, she was talking with family when she suddenly started holding the right side of her face, eyes became big and she started drooling.  When her family spoke to her, she could only moan.  She was unable to repeat.  She could not smile but did not exhibit facial droop or unilateral weakness.  After 5-6 minutes, she started speaking but it was slurred and didn't make sense.  She was unable to repeat phrases.  She has no recollection of this.  No convulsions or incontinence  She was brought to the ED.  CTA of head and neck personally  reviewed was negative for large vessel occlusion or hemodynamically significant stenosis.  Unable to have an MRI due to pacemaker.  She has a fib which is treated with Xarelto.  Routine awake and asleep EEG on 07/14/2020 was normal.   She has remote history of left sided temporal headaches (sometimes radiates to right side).  Sed rate in June 2022 was 58 and CRP was 1.2.  PAST MEDICAL HISTORY: Past Medical History:  Diagnosis Date   A-fib ([place])    Anemia     Cardiomyopathy ([place])    CHF (congestive heart failure) (HCC)    CKD (chronic kidney disease)    Diverticulosis    Hypertension    Lupus (HCC)    Osteoarthritis    Pulmonary fibrosis (HCC)    Vitamin D deficiency     MEDICATIONS: Current Outpatient Medications on File Prior to Visit  Medication Sig Dispense Refill   acetaminophen (TYLENOL) 500 MG tablet Take 1,000 mg by mouth every 6 (six) hours as needed (pain).     albuterol (VENTOLIN HFA) 108 (90 Base) MCG/ACT inhaler Inhale 2 puffs into the lungs every 6 (six) hours as needed for wheezing or shortness of breath.     allopurinol (ZYLOPRIM) 100 MG tablet TAKE 1 TABLET BY MOUTH EVERY DAY 30 tablet 2   Budeson-Glycopyrrol-Formoterol (BREZTRI AEROSPHERE) 160-9-4.8 MCG/ACT AERO Inhale 2 puffs into the lungs 2 (two) times daily as needed (shortness of breath).     Calcium Carbonate-Vitamin D (CALTRATE 600+D PO) Take 1 capsule by mouth daily with lunch.     cephALEXin (KEFLEX) 500 MG capsule Take 1 capsule (500 mg total) by mouth 3 (three) times daily for 5 days. 15 capsule 0   cetirizine HCl (ZYRTEC) 5 MG/5ML SOLN Take 5 mg by mouth at bedtime.     Cholecalciferol 25 MCG (1000 UT) tablet Take 1,000 Units by mouth daily with lunch.     ferrous sulfate 325 (65 FE) MG tablet Take 325 mg by mouth 2 (two) times daily with a meal.     fluticasone (FLONASE) 50 MCG/ACT nasal spray Place 2 sprays into both nostrils daily as needed for allergies.     folic acid (FOLVITE) Q000111Q MCG tablet Take 800 mcg by mouth daily with lunch.     furosemide (LASIX) 20 MG tablet Take 20 mg by mouth See admin instructions. Take one tablet (20 mg) by mouth every morning, Hold if BP is lower than 110/60     hydroxychloroquine (PLAQUENIL) 200 MG tablet Take 1 tablet (200 mg total) by mouth daily. 90 tablet 0   levothyroxine (SYNTHROID) 25 MCG tablet Take 1 tablet (25 mcg total) by mouth daily. 90 tablet 3   metoprolol succinate (TOPROL-XL) 25 MG 24 hr tablet Take 25 mg by  mouth See admin instructions. Take one tablet (25 mg) by mouth every morning, Hold dose if <110 SBP or <60 DBP     Multiple Vitamins-Minerals (MULTIVITAMIN WITH MINERALS) tablet Take 1 tablet by mouth daily with lunch.     Nintedanib (OFEV) 150 MG CAPS Take 150 mg by mouth at bedtime.     polyvinyl alcohol (LIQUIFILM TEARS) 1.4 % ophthalmic solution Place 1 drop into both eyes daily as needed for dry eyes.     predniSONE (DELTASONE) 5 MG tablet Take 1 tablet (5 mg total) by mouth daily with breakfast. Patient to double up dose during sick day rule 100 tablet 3   rivaroxaban (XARELTO) 15 MG TABS tablet Take 1 tablet (15 mg total) by mouth  daily with supper. 30 tablet 0   vitamin B-12 (CYANOCOBALAMIN) 1000 MCG tablet Take 1,000 mcg by mouth daily with lunch.     No current facility-administered medications on file prior to visit.    ALLERGIES: Allergies  Allergen Reactions   Propofol Other (See Comments)    Caused asthma   Flecainide Other (See Comments)    Unknown reaction   Amiodarone Other (See Comments)    Delerium/Confusion/Psychosis,    Amlodipine Other (See Comments)    Tired, syncope    FAMILY HISTORY: Family History  Problem Relation Age of Onset   Heart disease Mother    Hypertension Mother    Rheum arthritis Mother    Osteoarthritis Mother    Heart disease Father    Hypertension Father    Osteoarthritis Father    Heart disease Sister    Diabetes Brother    Cancer Brother       Objective:  Blood pressure 117/79, pulse 65, height 5\' 4"  (1.626 m), weight 122 lb (55.3 kg), SpO2 92 %. General: No acute distress.  Patient appears well-groomed.   Head:  Normocephalic/atraumatic Eyes:  Fundi examined but not visualized Neck: supple, no paraspinal tenderness, full range of motion Heart:  Regular rate and rhythm Lungs:  Clear to auscultation bilaterally Back: No paraspinal tenderness Neurological Exam: alert and oriented to person, place, and time.  Speech fluent and  not dysarthric, language intact.  CN II-XII intact. Bulk and tone normal, muscle strength 5/5 throughout.  Sensation to light touch intact.  Deep tendon reflexes 1+ throughout.  Finger to nose testing intact.  Gait cautious.  Assisted by cane.  Romberg negative.   Metta Clines, DO  CC: Fredda Hammed, PA-C

## 2021-03-19 ENCOUNTER — Ambulatory Visit
Admission: RE | Admit: 2021-03-19 | Discharge: 2021-03-19 | Disposition: A | Discharge status: Home / Self Care | Payer: Medicare HMO | Source: Ambulatory Visit | Attending: Internal Medicine | Admitting: Internal Medicine

## 2021-03-19 DIAGNOSIS — Z1231 Encounter for screening mammogram for malignant neoplasm of breast: Secondary | ICD-10-CM

## 2021-03-30 ENCOUNTER — Other Ambulatory Visit: Payer: Self-pay | Admitting: Internal Medicine

## 2021-03-30 DIAGNOSIS — M1A09X0 Idiopathic chronic gout, multiple sites, without tophus (tophi): Secondary | ICD-10-CM

## 2021-03-31 ENCOUNTER — Other Ambulatory Visit: Payer: Self-pay | Admitting: Internal Medicine

## 2021-03-31 DIAGNOSIS — R928 Other abnormal and inconclusive findings on diagnostic imaging of breast: Secondary | ICD-10-CM

## 2021-04-08 NOTE — Progress Notes (Signed)
? ?Office Visit Note ? ?Patient: Lindsey Huber             ?Date of Birth: 09-04-1945           ?MRN: [id number]             ?PCP: Scheryl Marten, PA ?Referring: Scheryl Marten, PA ?Visit Date: 04/09/2021 ? ? ?Subjective:  ?Follow-up (Right knee pain) ? ? ?History of Present Illness: Lindsey Huber is a 76 y.o. female here for follow up for systemic lupus and gout on HCQ 200 mg daily and allopurinol 100 mg daily. She takes prednisone 5 mg daily as well for adrenal insufficiency. She has had multiple falls since last visit most recently about a week ago. This is most often from her leg typically right knee suddenly giving way without preceding signs or symptoms. She fell onto her right hip. Also fell recently landing on left elbow with residual bruising around that area. No new gout attacks or joint swelling besides from the bruising. ? ?Previous HPI ?01/05/21 ?Lindsey Huber is a 76 y.o. female here for follow up for SLE with inflammatory arthritis and gout on hydroxychloroquine 200 mg daily and allopurinol 100 mg. 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  Since our last visit she had a significant event with hospitalization for hematochezia work-up identified a diverticular bleed.  She did have sufficient anemia required blood transfusion otherwise no major complications.  She has had no major change in pulmonary symptoms no new skin rashes or swelling.  Does have mild pedal edema.  Currently she is experiencing some increased knee pain and stiffness. ?  ?Previous HPI: ?06/27/20 ?Lindsey Huber is a 76 y.o. female here to establish care for systemic lupus and gouty arthritis currently on hydroxychloroquine 200 mg p.o. daily and allopurinol 100 mg p.o. daily.  She was originally diagnosed due to symptoms of multiple joint pains with orthopedic surgery evaluation in [place] with additional work-up revealing for systemic lupus.  She has previously  taken steroids and was on low-dose prednisone 5 mg daily for quite some time although discontinued at her most recent hospitalization.  She has had episodic gout flares involving both feet.  Lupus symptoms have been predominantly arthritis possibly a overlap with rheumatoid arthritis with no known history of lupus nephritis.  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  She has also had intra-articular steroid injection of the knees for arthritis symptoms with reasonably good benefit.  Her left knee has had some frequent slipping or instability reported she describes a fall last week despite routine use of a walker and sometimes knee brace for this. ?  ? ? ?Review of Systems  ?Constitutional:  Positive for fatigue.  ?HENT:  Positive for mouth dryness.   ?Eyes:  Positive for dryness.  ?Respiratory:  Positive for shortness of breath.   ?Cardiovascular:  Positive for swelling in legs/feet.  ?Gastrointestinal:  Positive for constipation and diarrhea.  ?Endocrine: Positive for cold intolerance, excessive thirst and increased urination.  ?Genitourinary:  Negative for difficulty urinating.  ?Musculoskeletal:  Positive for joint pain, gait problem, joint pain, joint swelling and morning stiffness.  ?Skin:  Negative for rash.  ?Allergic/Immunologic: Negative for susceptible to infections.  ?Neurological:  Positive for numbness and weakness.  ?Hematological:  Positive for bruising/bleeding tendency.  ?Psychiatric/Behavioral:  Positive for sleep disturbance.   ? ?PMFS History:  ?Patient Active Problem List  ? Diagnosis Date  Noted  ? Falls frequently 04/09/2021  ? Hypotension 12/14/2020  ? Hypokalemia 12/14/2020  ? Hypocalcemia 12/14/2020  ? Hypomagnesemia 12/14/2020  ? Acute GI bleeding 12/13/2020  ? Multinodular goiter 08/20/2020  ? Acquired hypothyroidism 08/20/2020  ? Idiopathic gout of multiple sites 06/27/2020  ? 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  ? Vitamin D deficiency  05/01/2020  ? Mammogram abnormal 05/01/2020  ? Proteinuria 05/01/2020  ? Other bilateral secondary osteoarthritis of knee 05/01/2020  ? Osteoarthritis of hip 05/01/2020  ? HTN (hypertension) 05/01/2020  ? GERD without esophagitis 05/01/2020  ? 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[place]) 05/01/2020  ? Cardiomyopathy ([place]) 05/01/2020  ? Iatrogenic hypotension 05/01/2020  ? Hypercoagulability due to atrial fibrillation ([place]) 05/01/2020  ? Systemic lupus erythematosus ([place]) 05/01/2020  ? Chronic heart failure with preserved ejection fraction ([place]) 05/01/2020  ? Paroxysmal atrial fibrillation with RVR ([place]) 05/01/2020  ? Protein calorie malnutrition ([place]) 05/01/2020  ? Chronic kidney disease (CKD), active medical management without dialysis, stage 3 (moderate) ([place]) 05/01/2020  ? 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[place]) 05/01/2020  ? Non-rheumatic atrial fibrillation ([place]) 05/01/2020  ? Atherosclerosis of abdominal aorta ([place]) 05/01/2020  ? Diverticulosis of colon 05/01/2020  ? Cholelithiasis 05/01/2020  ? Secondary adrenal insufficiency ([place]) 05/01/2020  ? Anemia of chronic disease 05/01/2020  ?  ?Past Medical History:  ?Diagnosis Date  ? A-fib ([place])   ? Anemia   ? Cardiomyopathy ([place])   ? CHF (congestive heart failure) ([place])   ? CKD (chronic kidney disease)   ? Diverticulosis   ? Hypertension   ? Lupus ([place])   ? Osteoarthritis   ? Pulmonary fibrosis ([place])   ? Vitamin D deficiency   ?  ?Family History  ?Problem Relation Age of Onset  ? Heart disease Mother   ? Hypertension Mother   ? Rheum arthritis Mother   ? Osteoarthritis Mother   ? Heart disease Father   ? Hypertension Father   ? Osteoarthritis Father   ? Heart disease Sister   ? Diabetes Brother   ? Cancer Brother   ? ?Past Surgical History:  ?Procedure Laterality Date  ? BREAST BIOPSY Left   ? CARDIAC VALVE SURGERY    ? CARPAL TUNNEL RELEASE    ? COLONOSCOPY WITH PROPOFOL N/A 12/20/2020  ? Procedure: COLONOSCOPY WITH PROPOFOL;   Surgeon: Arta Silence, MD;  Location: [facility];  Service: Endoscopy;  Laterality: N/A;  ? HEMORRHOID SURGERY    ? PACEMAKER INSERTION    ? RIGHT/LEFT HEART CATH AND CORONARY ANGIOGRAPHY N/A 11/27/2020  ? Procedure: RIGHT/LEFT HEART CATH AND CORONARY ANGIOGRAPHY;  Surgeon: Jolaine Artist, MD;  Location: [facility];  Service: Cardiovascular;  Laterality: N/A;  ? TONSILLECTOMY    ? ?Social History  ? ?Social History Narrative  ? Not on file  ? ?Immunization History  ?Administered Date(s) Administered  ? Influenza-Unspecified 10/26/2019  ? PFIZER(Purple Top)SARS-COV-2 Vaccination 04/17/2019, 05/08/2019  ?  ? ?Objective: ?Vital Signs: BP (!) 144/79 (BP Location: Left Arm, Patient Position: Sitting, Cuff Size: Normal)   Pulse 87   Resp 16   Ht 5\' 3"  (1.6 m)   Wt 120 lb (54.4 kg)   BMI 21.26 kg/m?   ? ?Physical Exam ?Constitutional:   ?   Comments: In wheelchair  ?Cardiovascular:  ?   Rate and Rhythm: Normal rate and regular rhythm.  ?Pulmonary:  ?   Effort: Pulmonary effort is normal.  ?   Comments: Inspiratory crackles bilaterally ?Musculoskeletal:  ?   Right lower leg: No  edema.  ?   Left lower leg: No edema.  ?Skin: ?   General: Skin is warm and dry.  ?   Comments: Left elbow and forearm with bruising on extensor surface  ?Neurological:  ?   Mental Status: She is alert.  ?Psychiatric:     ?   Mood and Affect: Mood normal.  ?  ? ?Musculoskeletal Exam:  ?Right wrist brace ?Left knee crepitus with bony enlargement, mild joint line tenderness, right knee tenderness on medial side no palpable effusion ?Ankles full ROM no tenderness or swelling ? ? ?Investigation: ?No additional findings. ? ?Imaging: ?DG Elbow 2 Views Left ? ?Result Date: 03/14/2021 ?CLINICAL DATA:  Crush injury.  Elbow pain. EXAM: LEFT ELBOW - 2 VIEW COMPARISON:  None. FINDINGS: There is no evidence of fracture, dislocation, or joint effusion. There is no evidence of arthropathy or other focal bone abnormality. Soft tissues are  unremarkable except for vascular calcification. IMPRESSION: Negative. Electronically Signed   By: Nelson Chimes M.D.   On: 03/14/2021 19:44  ? ?DG Forearm Left ? ?Result Date: 03/14/2021 ?CLINICAL DATA:  Fall with left arm pain.

## 2021-04-09 ENCOUNTER — Other Ambulatory Visit: Payer: Self-pay

## 2021-04-09 ENCOUNTER — Ambulatory Visit (INDEPENDENT_AMBULATORY_CARE_PROVIDER_SITE_OTHER): Payer: Medicare HMO | Admitting: Internal Medicine

## 2021-04-09 ENCOUNTER — Encounter: Payer: Self-pay | Admitting: Internal Medicine

## 2021-04-09 VITALS — BP 144/79 | HR 87 | Resp 16 | Ht 63.0 in | Wt 120.0 lb

## 2021-04-09 DIAGNOSIS — J84112 Idiopathic pulmonary fibrosis: Secondary | ICD-10-CM

## 2021-04-09 DIAGNOSIS — E2749 Other adrenocortical insufficiency: Secondary | ICD-10-CM | POA: Diagnosis not present

## 2021-04-09 DIAGNOSIS — D6869 Other thrombophilia: Secondary | ICD-10-CM | POA: Diagnosis not present

## 2021-04-09 DIAGNOSIS — M329 Systemic lupus erythematosus, unspecified: Secondary | ICD-10-CM

## 2021-04-09 DIAGNOSIS — R296 Repeated falls: Secondary | ICD-10-CM

## 2021-04-09 DIAGNOSIS — I4891 Unspecified atrial fibrillation: Secondary | ICD-10-CM

## 2021-04-09 HISTORY — DX: Repeated falls: R29.6

## 2021-04-10 MED ORDER — HYDROXYCHLOROQUINE SULFATE 200 MG PO TABS: 200.0000 mg | ORAL_TABLET | Freq: Every day | ORAL | 1 refills | Status: DC

## 2021-04-21 ENCOUNTER — Ambulatory Visit: Admission: RE | Admit: 2021-04-21 | Payer: Medicare HMO | Source: Ambulatory Visit

## 2021-04-21 ENCOUNTER — Other Ambulatory Visit: Payer: Self-pay

## 2021-04-21 ENCOUNTER — Ambulatory Visit
Admission: RE | Admit: 2021-04-21 | Discharge: 2021-04-21 | Disposition: A | Discharge status: Home / Self Care | Payer: Medicare HMO | Source: Ambulatory Visit | Attending: Internal Medicine | Admitting: Internal Medicine

## 2021-04-21 DIAGNOSIS — R928 Other abnormal and inconclusive findings on diagnostic imaging of breast: Secondary | ICD-10-CM

## 2021-04-23 ENCOUNTER — Encounter (HOSPITAL_COMMUNITY): Payer: Self-pay | Admitting: Internal Medicine

## 2021-04-23 ENCOUNTER — Ambulatory Visit (HOSPITAL_COMMUNITY)
Admission: RE | Admit: 2021-04-23 | Discharge: 2021-04-23 | Disposition: A | Discharge status: Home / Self Care | Payer: Medicare HMO | Source: Ambulatory Visit | Attending: Internal Medicine | Admitting: Internal Medicine

## 2021-04-23 VITALS — BP 170/88 | HR 85 | Wt 119.0 lb

## 2021-04-23 DIAGNOSIS — I5032 Chronic diastolic (congestive) heart failure: Secondary | ICD-10-CM | POA: Diagnosis not present

## 2021-04-23 DIAGNOSIS — I2721 Secondary pulmonary arterial hypertension: Secondary | ICD-10-CM

## 2021-04-23 DIAGNOSIS — M3214 Glomerular disease in systemic lupus erythematosus: Secondary | ICD-10-CM | POA: Diagnosis not present

## 2021-04-23 DIAGNOSIS — I495 Sick sinus syndrome: Secondary | ICD-10-CM | POA: Diagnosis not present

## 2021-04-23 DIAGNOSIS — E2749 Other adrenocortical insufficiency: Secondary | ICD-10-CM | POA: Insufficient documentation

## 2021-04-23 DIAGNOSIS — Z7952 Long term (current) use of systemic steroids: Secondary | ICD-10-CM | POA: Diagnosis not present

## 2021-04-23 DIAGNOSIS — Z95810 Presence of automatic (implantable) cardiac defibrillator: Secondary | ICD-10-CM | POA: Insufficient documentation

## 2021-04-23 DIAGNOSIS — I130 Hypertensive heart and chronic kidney disease with heart failure and stage 1 through stage 4 chronic kidney disease, or unspecified chronic kidney disease: Secondary | ICD-10-CM | POA: Insufficient documentation

## 2021-04-23 DIAGNOSIS — I071 Rheumatic tricuspid insufficiency: Secondary | ICD-10-CM

## 2021-04-23 DIAGNOSIS — Z79899 Other long term (current) drug therapy: Secondary | ICD-10-CM | POA: Insufficient documentation

## 2021-04-23 DIAGNOSIS — N189 Chronic kidney disease, unspecified: Secondary | ICD-10-CM | POA: Diagnosis not present

## 2021-04-23 DIAGNOSIS — I50810 Right heart failure, unspecified: Secondary | ICD-10-CM

## 2021-04-23 DIAGNOSIS — Z953 Presence of xenogenic heart valve: Secondary | ICD-10-CM | POA: Diagnosis not present

## 2021-04-23 DIAGNOSIS — I4821 Permanent atrial fibrillation: Secondary | ICD-10-CM | POA: Insufficient documentation

## 2021-04-23 DIAGNOSIS — J8410 Pulmonary fibrosis, unspecified: Secondary | ICD-10-CM | POA: Insufficient documentation

## 2021-04-23 DIAGNOSIS — J449 Chronic obstructive pulmonary disease, unspecified: Secondary | ICD-10-CM | POA: Diagnosis not present

## 2021-04-23 DIAGNOSIS — I272 Pulmonary hypertension, unspecified: Secondary | ICD-10-CM | POA: Insufficient documentation

## 2021-04-23 DIAGNOSIS — Z7901 Long term (current) use of anticoagulants: Secondary | ICD-10-CM | POA: Diagnosis not present

## 2021-04-23 MED ORDER — METOPROLOL SUCCINATE ER 25 MG PO TB24: 25.0000 mg | ORAL_TABLET | Freq: Every day | ORAL | 3 refills | Status: DC

## 2021-04-23 MED ORDER — RIVAROXABAN 15 MG PO TABS: 15.0000 mg | ORAL_TABLET | Freq: Every day | ORAL | 3 refills | Status: DC

## 2021-04-23 NOTE — Patient Instructions (Signed)
Your physician has requested that you have an echocardiogram. Echocardiography is a painless test that uses sound waves to create images of your heart. It provides your doctor with information about the size and shape of your heart and how well your heart?s chambers and valves are working. This procedure takes approximately one hour. There are no restrictions for this procedure. ? ?Your physician recommends that you schedule a follow-up appointment in: 4 months, **PLEASE CALL OUR OFFICE IN MAY TO SCHEDULE THIS APPOINTMENT ? ?If you have any questions or concerns before your next appointment please send us a message through [facility] or call our office at [phone].   ? ?TO LEAVE A MESSAGE FOR THE NURSE SELECT OPTION 2, PLEASE LEAVE A MESSAGE INCLUDING: ?YOUR NAME ?DATE OF BIRTH ?CALL BACK NUMBER ?REASON FOR CALL**this is important as we prioritize the call backs ? ?YOU WILL RECEIVE A CALL BACK THE SAME DAY AS LONG AS YOU CALL BEFORE 4:00 PM ? ?At the [facility], you and your health needs are our priority. As part of our continuing mission to provide you with exceptional heart care, we have created designated Provider Care Teams. These Care Teams include your primary Cardiologist (physician) and Advanced Practice Providers (APPs- Physician Assistants and Nurse Practitioners) who all work together to provide you with the care you need, when you need it.  ? ?You may see any of the following providers on your designated Care Team at your next follow up: ?Dr Arvilla Meresaniel Bensimhon ?Dr Marca Anconaalton McLean ?Tonye BecketAmy Clegg, NP ?Robbie LisBrittainy Simmons, PA ?Jessica Milford,NP ?Anna GenreLindsay Finch, PA ?Karle PlumberLauren Kemp, PharmD ? ? ?Please be sure to bring in all your medications bottles to every appointment.  ? ? ?

## 2021-04-23 NOTE — Progress Notes (Signed)
? ? ?ADVANCED HF CLINIC NOTE ? ?Referring Physician: Dr. Lalla Brothers ?Primary Care: [facility] Me[facility], [place] ?Primary Cardiologist: EP Steffanie Dunn ? ?HPI: ?[name] is a 76 yo female with chronic diastolic CHF, permanent AF, AFL s/p ablation, SLE, pulmonary fibrosis, pulmonary hypertension. paroxysmal VT, OSA not on cpap and valvular disease s/p AVR/MV ring/TVR. Referred by Dr. Lalla Brothers in 10/22 for further evaluation of PAH and valvular heart disease. ? ?History of valvular disease with multiple replaced valves AVR/MV ring/TVR 09/25/2010.  Paroxysmal VT, ICD placed due to torsades on Flecainide with SSS requiring PPM which predated valve repar/replacement surgery 09/25/2010 and in fact ICD lead was damaged during valve replacement surgery.      ? ?History of chronic COPD and pulmonary fibrosis followed by pulmonology.  On triple therapy for COPD vs asthma,  nintedanib for PF. 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  Did not smoke but inhaled second hand smoke from husband and mother pretty much her whole life until 01/2020.  Continued nintedanib switched inhaler therapy to [facility].   ? ?Echocardiogram 12/06/2018: EF 63%,normal bioprosthetic tricuspid and aortic valve with mild tricuspid regurgitation, mitral valve repair with mild mitral regurgitation, severe biatrial enlargement.  ? ?Admitted in [place], [place] for hypotension and acute kidney injury 03/10/2020.  She was placed on broad-spectrum antibiotics with initial diagnosis of sepsis, no source of infection found also required Levophed and IVF infusion to support blood pressure, started on COPD therapy.  There was question of whether adrenal insufficiency contributing.  Steroid therapy reinstituted.  ? ?Echo 03/11/2020  LVEF 55 to 60%, moderate to severe tricuspid regurgitation, moderately elevated right ventricular systolic pressure estimate 51mm Hg, moderate mitral regurgitation.  RV not well visualized.   ? ?Seen in HF Clinic for first visit 10/27/20. Had NYHA  IIIB symptoms. Referred for R/L cath.  ? ?R/LHC 11/27/20 ?Ao =129/63 (89) ?LV = 141/8 ?RA = 11 (v waves to 14) ?RV = 43/4 ?PA = 42/15 (25) ?PCW = 10 ?Fick cardiac output/index = 5.7/3.7 ?PVR = 2.1 WU ?SVR = 1101 ?PAPi = 2.45 WU ?Ao sat = 96% ?PA sat =  74%, 75% ?SVC sat = 82% ?MVA (LV-wedge tracing) = 3.5cm2 mean gradient across MV 3.105mmHG  ?Assessment:  ?1. Normal coronary arteries ?2. EF 60-65% ?3. Very mild PAH ?4. Normal left-sided filling pressures with no significant v-waves in PCWP tracing to suggest hemodynamically significant MR ?5. No significant stenosis across AoV, MV or TV ? ?Admitted 11/22 with acute LGIB. CT scan showed acute GI hemorrhage involving mid descending colon, likely acute diverticular bleed. Transfused 3u RBCs. Colonoscopy 11/26 which noted hemorrhoids and diverticulosis, no active bleeding, recommended to restart Xarelto in 3 days if stable.  ? ?She was seen in the ED 03/14/21 for difficulty with word finding. CT Head with no acute findings. Discharged the same day and has follow up with Neurology.  ? ?Returns for HF follow up. Overall feeling fine. SOB with exertion. Some days better than others. Denies PND/Orthopnea. Doing a few little things around the house. SBP mat home 120s No bleeding issues. Appetite ok. No fever or chills. Weight at home 114-118 pounds. Taking all medications. ? ?Past Medical History:  ?Diagnosis Date  ? A-fib (HCC)   ? Anemia   ? Cardiomyopathy (HCC)   ? CHF (congestive heart failure) (HCC)   ? CKD (chronic kidney disease)   ? Diverticulosis   ? Hypertension   ? Lupus (HCC)   ? Osteoarthritis   ? Pulmonary fibrosis (HCC)   ?  Vitamin D deficiency   ? ? ?Current Outpatient Medications  ?Medication Sig Dispense Refill  ? acetaminophen (TYLENOL) 500 MG tablet Take 1,000 mg by mouth every 6 (six) hours as needed (pain).    ? albuterol (VENTOLIN HFA) 108 (90 Base) MCG/ACT inhaler Inhale 2 puffs into the lungs every 6 (six) hours as needed for wheezing or shortness of  breath.    ? allopurinol (ZYLOPRIM) 100 MG tablet TAKE 1 TABLET BY MOUTH EVERY DAY 30 tablet 2  ? Budeson-Glycopyrrol-Formoterol (BREZTRI AEROSPHERE) 160-9-4.8 MCG/ACT AERO Inhale 2 puffs into the lungs 2 (two) times daily as needed (shortness of breath).    ? Calcium Carbonate-Vitamin D (CALTRATE 600+D PO) Take 1 capsule by mouth daily with lunch.    ? cetirizine HCl (ZYRTEC) 5 MG/5ML SOLN Take 5 mg by mouth at bedtime.    ? Cholecalciferol 25 MCG (1000 UT) tablet Take 1,000 Units by mouth daily with lunch.    ? ferrous sulfate 325 (65 FE) MG tablet Take 325 mg by mouth 2 (two) times daily with a meal.    ? fluticasone (FLONASE) 50 MCG/ACT nasal spray Place 2 sprays into both nostrils daily as needed for allergies.    ? folic acid (FOLVITE) 800 MCG tablet Take 800 mcg by mouth daily with lunch.    ? furosemide (LASIX) 20 MG tablet Take 20 mg by mouth See admin instructions. Take one tablet (20 mg) by mouth every morning, Hold if BP is lower than 110/60    ? hydroxychloroquine (PLAQUENIL) 200 MG tablet Take 1 tablet (200 mg total) by mouth daily. 90 tablet 1  ? levothyroxine (SYNTHROID) 25 MCG tablet Take 1 tablet (25 mcg total) by mouth daily. 90 tablet 3  ? metoprolol succinate (TOPROL-XL) 25 MG 24 hr tablet Take 25 mg by mouth See admin instructions. Take one tablet (25 mg) by mouth every morning, Hold dose if <110 SBP or <60 DBP    ? Multiple Vitamins-Minerals (MULTIVITAMIN WITH MINERALS) tablet Take 1 tablet by mouth daily with lunch.    ? Nintedanib (OFEV) 150 MG CAPS Take 150 mg by mouth at bedtime.    ? predniSONE (DELTASONE) 5 MG tablet Take 1 tablet (5 mg total) by mouth daily with breakfast. Patient to double up dose during sick day rule 100 tablet 3  ? rivaroxaban (XARELTO) 15 MG TABS tablet Take 1 tablet (15 mg total) by mouth daily with supper. 30 tablet 0  ? vitamin B-12 (CYANOCOBALAMIN) 1000 MCG tablet Take 1,000 mcg by mouth daily with lunch.    ? ?No current facility-administered medications for  this encounter.  ? ? ?Allergies  ?Allergen Reactions  ? Propofol Other (See Comments)  ?  Caused asthma ?Other reaction(s): asthma  ? Flecainide Other (See Comments)  ?  Unknown reaction ?Other reaction(s): unknown  ? Amiodarone Other (See Comments)  ?  Delerium/Confusion/Psychosis,  ?Other reaction(s): unknown  ? Amlodipine Other (See Comments)  ?  Tired, syncope ?Other reaction(s): unknown  ? ? ?  ?Social History  ? ?Socioeconomic History  ? Marital status: Legally Separated  ?  Spouse name: Not on file  ? Number of children: Not on file  ? Years of education: Not on file  ? Highest education level: Not on file  ?Occupational History  ? Not on file  ?Tobacco Use  ? Smoking status: Never  ? Smokeless tobacco: Never  ?Vaping Use  ? Vaping Use: Never used  ?Substance and Sexual Activity  ? Alcohol use: Never  ?  Drug use: Never  ? Sexual activity: Not on file  ?Other Topics Concern  ? Not on file  ?Social History Narrative  ? Not on file  ? ?Social Determinants of Health  ? ?Financial Resource Strain: Not on file  ?Food Insecurity: Not on file  ?Transportation Needs: Not on file  ?Physical Activity: Not on file  ?Stress: Not on file  ?Social Connections: Not on file  ?Intimate Partner Violence: Not on file  ? ? ?  ?Family History  ?Problem Relation Age of Onset  ? Heart disease Mother   ? Hypertension Mother   ? Rheum arthritis Mother   ? Osteoarthritis Mother   ? Heart disease Father   ? Hypertension Father   ? Osteoarthritis Father   ? Heart disease Sister   ? Diabetes Brother   ? Cancer Brother   ? ? ?Vitals:  ? 04/23/21 1530  ?BP: (!) 170/88  ?Pulse: 85  ?SpO2: 98%  ?Weight: 54 kg (119 lb)  ? ? ?Wt Readings from Last 3 Encounters:  ?04/23/21 54 kg (119 lb)  ?04/09/21 54.4 kg (120 lb)  ?03/18/21 55.3 kg (122 lb)  ? ? ?PHYSICAL EXAM: ?General:  Walked in the clinic with cane. No resp difficulty ?HEENT: normal ?Neck: supple. JVP to jaw . Carotids 2+ bilat; no bruits. No lymphadenopathy or thryomegaly  appreciated. ?Cor: PMI nondisplaced. Regular rate & rhythm. No rubs, gallops. 2/6 TR ?Lungs: clear ?Abdomen: soft, nontender, nondistended. No hepatosplenomegaly. No bruits or masses. Good bowel sounds. ?Extremities: no

## 2021-04-28 NOTE — Progress Notes (Signed)
? ?Cardiology Office Note ?Date:  04/28/2021  ?Patient ID:  Lindsey Huber, Lindsey Huber 1945-04-18, MRN [id number] ?PCP:  Scheryl Marten, PA  ?Cardiologist:  Dr. Haroldine Laws ?Electrophysiologist: Dr. Quentin Ore ? ?  ?Chief Complaint: annual visit ? ?History of Present Illness: ?Lindsey Huber is a 76 y.o. female with history of IPF, HTN, GERD, AFib/flutter (permanent), VT, torsades (in setting of flecainide) > ICD, ? Hx of TIA hx,  VHD w/ bioprosthetic AVR, MV ring, TV replacement (2012), CKD (III), SLE, anemia of chronic disease, hypothyroidism, chronic CHF (diastolic). ? ?1. On September 25, 2010 the patient was brought to the operating room with Dr. ?Lambert Keto and underwent aortic valve replacement, bioprosthetic, excision ?of the subaortic membrane, mitral valve annuloplasty, tricuspid valve repair ?followed by tricuspid valve replacement, and manual extraction of right ?ventricular lead. At that time she was brought to the intensive care unit ?with an open chest secondary to continued bleeding. ?2. On September 3rd the patient was brought back to the operating room with ?Dr. Lambert Keto for reexploration of the mediastinum with mediastinal ?washout and sternal closure. ?3. A third procedure was performed on September 20 by Dr. Adair Laundry ?which included an ICD lead insertion and generator replacement with a ?Medtronic ICD. ? ?She comes in today to be seen for Dr. Quentin Ore, last seen by him April 2022, to establish management of her device, moving from [place]. ?She reported some nose bleeds and changed to Xarelto. ? ?Nov 2022, admitted with GIB, CT scan showed acute GI hemorrhage involving mid descending colon, likely acute diverticular bleed. Transfused 3u RBCs. Colonoscopy 11/26 which noted hemorrhoids and diverticulosis, no active bleeding, recommended to restart Xarelto in 3 days if stable ? ?She had an ER visit Feb 2023 with some transient confusion, word finding difficulty.  CT brain without acute  findings, labs unremarkable.  She had fall a couple days prior striking her arm, felt to have a cellulitis ?They did get hx of perhaps TIA in June of last year ?Neurology consulted, recommended MRI though, unable to get MRI done on the weekend given her device and pt/family preferred not to wait on that given resolved symptoms ?Discharged on Keflex and advised to f/u with neurology ? ?She has been established with the HF team, saw Dr. Haroldine Laws last 04/23/21, doing OK< waxing/waning DOE/SOB, exertional capacity., weight stable ?Planned to update her echo to re-evaluate her VHD, discussed  that "she has significant prosthetic valve TR with RV failure. Pulmonary pressures only mildly elevated, Discussed need for closer volume management with sliding scale lasix. Can switch to torsemide as needed, Will repeat echo at next visit. If RV failure and TR worsens may need to consider percutaneous valve options" ? ? ?TODAY ?She comes with her grand daughter. ?Since her visit with Dr. Haroldine Laws, no new symptoms or concerns ?She is currently getting an ambulatory EEG via neurology team. ?No syncope, no device therapies ?Occassionally she feel like the device sits towards he axilla, though otherwise no concerns. ?She reports her arm healed up well ?No bleeding, signs of bleeding ? ? ?Device information ?MDT single chamber ICD implanted 10/15/2010, gen change 09/02/2017 ?There is mention of ?damage to one of her device leads during her valvular surgery ?Secondary prevention indication with reports of torsades/VT ?In review of her CXRs, appears she has an abandoned RA lead and a broken/abandoned duakl coils RV lead. ?Original device implanted 06/23/2006, indication was for "Sick sinus syndrome, atrial standstill, advanced arteriovenous block (AV Wenckebach rate 80 beats per minute), VT/VF arrest (torsades),  mixed connective tissue disease" ? ?Past Medical History:  ?Diagnosis Date  ? A-fib ([place])   ? Anemia   ? Cardiomyopathy ([place])   ?  CHF (congestive heart failure) ([place])   ? CKD (chronic kidney disease)   ? Diverticulosis   ? Hypertension   ? Lupus ([place])   ? Osteoarthritis   ? Pulmonary fibrosis ([place])   ? Vitamin D deficiency   ? ? ?Past Surgical History:  ?Procedure Laterality Date  ? BREAST BIOPSY Left   ? CARDIAC VALVE SURGERY    ? CARPAL TUNNEL RELEASE    ? COLONOSCOPY WITH PROPOFOL N/A 12/20/2020  ? Procedure: COLONOSCOPY WITH PROPOFOL;  Surgeon: Arta Silence, MD;  Location: [facility];  Service: Endoscopy;  Laterality: N/A;  ? HEMORRHOID SURGERY    ? PACEMAKER INSERTION    ? RIGHT/LEFT HEART CATH AND CORONARY ANGIOGRAPHY N/A 11/27/2020  ? Procedure: RIGHT/LEFT HEART CATH AND CORONARY ANGIOGRAPHY;  Surgeon: Jolaine Artist, MD;  Location: [facility];  Service: Cardiovascular;  Laterality: N/A;  ? TONSILLECTOMY    ? ? ?Current Outpatient Medications  ?Medication Sig Dispense Refill  ? acetaminophen (TYLENOL) 500 MG tablet Take 1,000 mg by mouth every 6 (six) hours as needed (pain).    ? albuterol (VENTOLIN HFA) 108 (90 Base) MCG/ACT inhaler Inhale 2 puffs into the lungs every 6 (six) hours as needed for wheezing or shortness of breath.    ? allopurinol (ZYLOPRIM) 100 MG tablet TAKE 1 TABLET BY MOUTH EVERY DAY 30 tablet 2  ? Budeson-Glycopyrrol-Formoterol (BREZTRI AEROSPHERE) 160-9-4.8 MCG/ACT AERO Inhale 2 puffs into the lungs 2 (two) times daily as needed (shortness of breath).    ? Calcium Carbonate-Vitamin D (CALTRATE 600+D PO) Take 1 capsule by mouth daily with lunch.    ? cetirizine HCl (ZYRTEC) 5 MG/5ML SOLN Take 5 mg by mouth at bedtime.    ? Cholecalciferol 25 MCG (1000 UT) tablet Take 1,000 Units by mouth daily with lunch.    ? ferrous sulfate 325 (65 FE) MG tablet Take 325 mg by mouth 2 (two) times daily with a meal.    ? fluticasone (FLONASE) 50 MCG/ACT nasal spray Place 2 sprays into both nostrils daily as needed for allergies.    ? folic acid (FOLVITE) [id number] MCG tablet Take 800 mcg by mouth daily with lunch.    ?  furosemide (LASIX) 20 MG tablet Take 20 mg by mouth See admin instructions. Take one tablet (20 mg) by mouth every morning, Hold if BP is lower than 110/60    ? hydroxychloroquine (PLAQUENIL) 200 MG tablet Take 1 tablet (200 mg total) by mouth daily. 90 tablet 1  ? levothyroxine (SYNTHROID) 25 MCG tablet Take 1 tablet (25 mcg total) by mouth daily. 90 tablet 3  ? metoprolol succinate (TOPROL-XL) 25 MG 24 hr tablet Take 1 tablet (25 mg total) by mouth daily. Hold dose if <110 SBP or <60 DBP 90 tablet 3  ? Multiple Vitamins-Minerals (MULTIVITAMIN WITH MINERALS) tablet Take 1 tablet by mouth daily with lunch.    ? Nintedanib (OFEV) 150 MG CAPS Take 150 mg by mouth at bedtime.    ? predniSONE (DELTASONE) 5 MG tablet Take 1 tablet (5 mg total) by mouth daily with breakfast. Patient to double up dose during sick day rule 100 tablet 3  ? Rivaroxaban (XARELTO) 15 MG TABS tablet Take 1 tablet (15 mg total) by mouth daily with supper. 90 tablet 3  ? vitamin B-12 (CYANOCOBALAMIN) 1000 MCG tablet Take 1,000 mcg  by mouth daily with lunch.    ? ?No current facility-administered medications for this visit.  ? ? ?Allergies:   Propofol, Flecainide, Amiodarone, and Amlodipine  ? ?Social History:  The patient  reports that she has never smoked. She has never used smokeless tobacco. She reports that she does not drink alcohol and does not use drugs.  ? ?Family History:  The patient's family history includes Cancer in her brother; Diabetes in her brother; Heart disease in her father, mother, and sister; Hypertension in her father and mother; Osteoarthritis in her father and mother; Rheum arthritis in her mother. ? ?ROS:  Please see the history of present illness.    ?All other systems are reviewed and otherwise negative.  ? ?PHYSICAL EXAM:  ?VS:  There were no vitals taken for this visit. BMI: There is no height or weight on file to calculate BMI. ?Well nourished, well developed, in no acute distress ?HEENT: normocephalic,  atraumatic ?Neck: no JVD, carotid bruits or masses ?Cardiac:  RRR; (paced), 2-3/6 SM, no rubs, or gallops ?Lungs:  CTA b/l, no wheezing, rhonchi or rales ?Abd: soft, nontender ?MS: no deformity, advanced  atrophy ?Ext:

## 2021-04-29 ENCOUNTER — Ambulatory Visit (INDEPENDENT_AMBULATORY_CARE_PROVIDER_SITE_OTHER): Payer: Medicare HMO | Admitting: Neurology

## 2021-04-29 ENCOUNTER — Encounter: Payer: Self-pay | Admitting: Physician Assistant

## 2021-04-29 ENCOUNTER — Ambulatory Visit (INDEPENDENT_AMBULATORY_CARE_PROVIDER_SITE_OTHER): Payer: Medicare HMO | Admitting: Physician Assistant

## 2021-04-29 VITALS — BP 138/70 | HR 81 | Ht 63.0 in | Wt 114.0 lb

## 2021-04-29 DIAGNOSIS — I472 Ventricular tachycardia, unspecified: Secondary | ICD-10-CM | POA: Diagnosis not present

## 2021-04-29 DIAGNOSIS — I4821 Permanent atrial fibrillation: Secondary | ICD-10-CM

## 2021-04-29 DIAGNOSIS — Z954 Presence of other heart-valve replacement: Secondary | ICD-10-CM

## 2021-04-29 DIAGNOSIS — R404 Transient alteration of awareness: Secondary | ICD-10-CM | POA: Diagnosis not present

## 2021-04-29 DIAGNOSIS — I5032 Chronic diastolic (congestive) heart failure: Secondary | ICD-10-CM

## 2021-04-29 DIAGNOSIS — Z9889 Other specified postprocedural states: Secondary | ICD-10-CM

## 2021-04-29 DIAGNOSIS — I429 Cardiomyopathy, unspecified: Secondary | ICD-10-CM

## 2021-04-29 DIAGNOSIS — Z95810 Presence of automatic (implantable) cardiac defibrillator: Secondary | ICD-10-CM

## 2021-04-29 DIAGNOSIS — Z952 Presence of prosthetic heart valve: Secondary | ICD-10-CM | POA: Diagnosis not present

## 2021-04-29 LAB — CUP PACEART INCLINIC DEVICE CHECK
Battery Remaining Longevity: 69 mo
Battery Voltage: 2.98 V
Brady Statistic RV Percent Paced: 98.57 %
Date Time Interrogation Session: 20230405133241
HighPow Impedance: 61 Ohm
Implantable Lead Implant Date: 20120920
Implantable Lead Location: 753862
Implantable Pulse Generator Implant Date: 20190809
Lead Channel Impedance Value: 304 Ohm
Lead Channel Impedance Value: 361 Ohm
Lead Channel Pacing Threshold Amplitude: 0.625 V
Lead Channel Pacing Threshold Pulse Width: 0.4 ms
Lead Channel Sensing Intrinsic Amplitude: 8.625 mV
Lead Channel Sensing Intrinsic Amplitude: 8.625 mV
Lead Channel Setting Pacing Amplitude: 1.25 V
Lead Channel Setting Pacing Pulse Width: 0.4 ms
Lead Channel Setting Sensing Sensitivity: 0.3 mV

## 2021-04-29 NOTE — Patient Instructions (Signed)
Medication Instructions:  ? ?Your physician recommends that you continue on your current medications as directed. Please refer to the Current Medication list given to you today. ? ? ? ?*If you need a refill on your cardiac medications before your next appointment, please call your pharmacy* ? ? ?Lab Work:  NONE ORDERED  TODAY ? ? ? ?If you have labs (blood work) drawn today and your tests are completely normal, you will receive your results only by: ?MyChart Message (if you have MyChart) OR ?A paper copy in the mail ?If you have any lab test that is abnormal or we need to change your treatment, we will call you to review the results. ? ? ?Testing/Procedures: NONE ORDERED  TODAY ? ? ? ? ? ?Follow-Up: ?At [facility], you and your health needs are our priority.  As part of our continuing mission to provide you with exceptional heart care, we have created designated Provider Care Teams.  These Care Teams include your primary Cardiologist (physician) and Advanced Practice Providers (APPs -  Physician Assistants and Nurse Practitioners) who all work together to provide you with the care you need, when you need it. ? ?We recommend signing up for the patient portal called "MyChart".  Sign up information is provided on this After Visit Summary.  MyChart is used to connect with patients for Virtual Visits (Telemedicine).  Patients are able to view lab/test results, encounter notes, upcoming appointments, etc.  Non-urgent messages can be sent to your provider as well.   ?To learn more about what you can do with MyChart, go to [email or web address].   ? ?Your next appointment:   ?1 year(s) ? ?The format for your next appointment:   ?In Person ? ?Provider:   ?You may see Lanier PrudeAMERON T LAMBERT, MD or one of the following Advanced Practice Providers on your designated Care Team:   ?Francis DowseRenee Ursuy, PA-C ?Casimiro NeedleMichael "Mardelle MatteAndy" [place], PA-C  ? ? ?Other Instructions ? ?

## 2021-05-06 ENCOUNTER — Other Ambulatory Visit (HOSPITAL_COMMUNITY): Payer: Medicare HMO

## 2021-05-06 NOTE — Procedures (Signed)
ELECTROENCEPHALOGRAM REPORT ? ?Dates of Recording: 04/29/2021 at 9:22 AM to 04/30/2021 at 10:24 AM ? ?Patient's Name: Lindsey Huber ?MRN: [id number] ?Date of Birth: May 09, 1945 ? ?Procedure: 25-hour ambulatory EEG ? ?History: 76 year old female with 2 similar episodes of altered awareness ? ?Medications: Plaquenil, Synthroid, Toprol-XL, Lopressor, Xarelto, Cortef, Zyrtec ? ?Technical Summary: ?This is a 25-hour multichannel digital EEG recording measured by the international 10-20 system with electrodes applied with paste and impedances below 5000 ohms performed as portable with EKG monitoring.  The digital EEG was referentially recorded, reformatted, and digitally filtered in a variety of bipolar and referential montages for optimal display.   ? ?DESCRIPTION OF RECORDING: ?During maximal wakefulness, the background activity consisted of a symmetric 10-11Hz  posterior dominant rhythm which was reactive to eye opening.  There were no epileptiform discharges or focal slowing seen in wakefulness. ? ?During the recording, the patient progresses through wakefulness, drowsiness, and Stage 2 sleep.  Again, there were no epileptiform discharges seen. ? ?Events: ? ?There were no electrographic seizures seen.  EKG lead was unremarkable. ? ?IMPRESSION: This 25-hour ambulatory EEG study is normal.   ? ?CLINICAL CORRELATION: A normal EEG does not exclude a clinical diagnosis of epilepsy.  If further clinical questions remain, inpatient video EEG monitoring may be helpful. ? ? ?Metta Clines, DO ? ?

## 2021-05-08 ENCOUNTER — Encounter: Payer: Self-pay | Admitting: Internal Medicine

## 2021-05-08 NOTE — Progress Notes (Signed)
?  Summary of Your Request ?Please review the details of your request below and if everything looks correct click CONTINUE ? ?Your case has been sent to clinical review. You will be notified via fax within 2 business days if additional clinical information is needed. If you wish to speak with [facility] at anytime, please call [phone]. ?  ?Name: DR. ADAM JAFFE Contact: Nury Nebergall ?Address: [address] ?[place], [place] [address] Phone Number: [phone]  ?Fax Number: [phone] ?Patient Name: Lindsey Huber Patient Id: [id number] ?Insurance Carrier: [employer] ?Site [facility]: [facility] Site ID: [id number] ?Site Address: [address] ?[place], [place] [address]     ?Primary Diagnosis Code: R41.89 Description: Other symptoms and signs involving cognitive functions and awareness ?Secondary Diagnosis Code:  Description:  ?Date of Service: Not provided   ?CPT Code: 96283 Description: CT HEAD/BRAIN W/O CONTRAST ?Case Number: [id number] ?Review Date: 05/08/2021 2:48:13 PM ?Expiration Date: N/A ?Status: Your case has been sent to clinical review. You will be notified via fax within 2 business days if additional clinical information is needed. If you wish to speak with [employer] at anytime, please call [phone] ?

## 2021-05-11 ENCOUNTER — Other Ambulatory Visit: Payer: Self-pay | Admitting: Internal Medicine

## 2021-05-11 DIAGNOSIS — E042 Nontoxic multinodular goiter: Secondary | ICD-10-CM

## 2021-05-11 NOTE — Progress Notes (Signed)
Your case has been sent to clinical review. You will be notified via fax within 2 business days if additional clinical information is needed. If you wish to speak with [employer] at anytime, please call [phone]. ?  ?Name: DR. ADAM JAFFE Contact: Efe Fazzino ?Address: [address] ?[place], [place] [address] Phone Number: [phone]  ?Fax Number: [phone] ?Patient Name: Lindsey Huber Patient Id: [id number] ?Insurance Carrier: [employer]ET[employer] ?Site Name: [facility] Site ID: [id number] ?Site Address: [address] ?[place], [place] [address]     ?Primary Diagnosis Code: R41.89 Description: Other symptoms and signs involving cognitive functions and awareness ?Secondary Diagnosis Code:  Description:  ?Date of Service: Not provided   ?CPT Code: 4742570551 Description: MRI Brain W/O CONTRAST ?Case Number: [id number] ?Review Date: 05/11/2021 1:38:09 PM ?Expiration Date: N/A ?Status: Your case has been sent to clinical review. You will be notified via fax within 2 business days if additional clinical information is needed. If you wish to speak with [employer] at anytime, please call [phone]. ?

## 2021-05-12 ENCOUNTER — Other Ambulatory Visit: Payer: Self-pay | Admitting: Internal Medicine

## 2021-05-12 ENCOUNTER — Ambulatory Visit (HOSPITAL_COMMUNITY): Admission: RE | Admit: 2021-05-12 | Payer: Medicare HMO | Source: Ambulatory Visit

## 2021-05-12 ENCOUNTER — Telehealth: Payer: Self-pay | Admitting: Neurology

## 2021-05-12 ENCOUNTER — Encounter (HOSPITAL_COMMUNITY): Payer: Self-pay

## 2021-05-12 NOTE — Telephone Encounter (Signed)
Patient needs to know that an MRI cannot be performed until cardiology fixes the pacemaker. ?

## 2021-05-12 NOTE — Telephone Encounter (Signed)
Per pt daughter she was not advised of this at her visit 04/29/21 with Cardiology. ?Reviewed the note from that visit.  ? ?Per Steffanie Dunn MD ?Device interrogation reviewed. Lead parameters stable. Continue remote monitoring. ?  ?Lindsey Lang T. Lalla Brothers, MD, [facility], [facility] ?Cardiac Electrophysiology. ? ?Pt has an appt tomorrow at [facility] will ask then and get back with [place]. ?

## 2021-05-13 ENCOUNTER — Ambulatory Visit (HOSPITAL_COMMUNITY)
Admission: RE | Admit: 2021-05-13 | Discharge: 2021-05-13 | Disposition: A | Discharge status: Home / Self Care | Payer: Medicare HMO | Source: Ambulatory Visit | Attending: Internal Medicine | Admitting: Internal Medicine

## 2021-05-13 DIAGNOSIS — I5032 Chronic diastolic (congestive) heart failure: Secondary | ICD-10-CM | POA: Insufficient documentation

## 2021-05-13 DIAGNOSIS — Z954 Presence of other heart-valve replacement: Secondary | ICD-10-CM | POA: Diagnosis not present

## 2021-05-13 DIAGNOSIS — Y838 Other surgical procedures as the cause of abnormal reaction of the patient, or of later complication, without mention of misadventure at the time of the procedure: Secondary | ICD-10-CM | POA: Diagnosis not present

## 2021-05-13 DIAGNOSIS — I083 Combined rheumatic disorders of mitral, aortic and tricuspid valves: Secondary | ICD-10-CM | POA: Insufficient documentation

## 2021-05-13 DIAGNOSIS — Z953 Presence of xenogenic heart valve: Secondary | ICD-10-CM | POA: Diagnosis not present

## 2021-05-13 DIAGNOSIS — I071 Rheumatic tricuspid insufficiency: Secondary | ICD-10-CM | POA: Diagnosis not present

## 2021-05-13 DIAGNOSIS — T82857A Stenosis of cardiac prosthetic devices, implants and grafts, initial encounter: Secondary | ICD-10-CM | POA: Insufficient documentation

## 2021-05-13 DIAGNOSIS — I110 Hypertensive heart disease with heart failure: Secondary | ICD-10-CM | POA: Insufficient documentation

## 2021-05-13 LAB — ECHOCARDIOGRAM COMPLETE
AV Mean grad: 32.4 mmHg
AV Peak grad: 64 mmHg
Ao pk vel: 4 m/s
Area-P 1/2: 2.96 cm2
S' Lateral: 2.4 cm

## 2021-05-14 ENCOUNTER — Telehealth: Payer: Self-pay | Admitting: Neurology

## 2021-05-14 NOTE — Progress Notes (Signed)
[facility] [phone], ?Approved 05/13/21-11/09/21 Auth [id number] ?

## 2021-05-14 NOTE — Telephone Encounter (Signed)
Spoke to the patient daughter if the Cardiologist states patient pacemaker lead are okay please go ahead and schedule the MRI. ? ?Mri Approved by her insurance. Note added to the Appt desk.  ?

## 2021-05-14 NOTE — Telephone Encounter (Signed)
Pt's daughter called in to let us know there is not anything wrong with the pt's pacemaker. The Stenographer told her there was nothing wrong with the leads.They are not sure exactly why they couldn't do the MRI.  ?

## 2021-05-19 ENCOUNTER — Ambulatory Visit (INDEPENDENT_AMBULATORY_CARE_PROVIDER_SITE_OTHER): Payer: Medicare HMO

## 2021-05-19 ENCOUNTER — Other Ambulatory Visit: Payer: Self-pay | Admitting: Internal Medicine

## 2021-05-19 DIAGNOSIS — I480 Paroxysmal atrial fibrillation: Secondary | ICD-10-CM | POA: Diagnosis not present

## 2021-05-19 DIAGNOSIS — M1A09X0 Idiopathic chronic gout, multiple sites, without tophus (tophi): Secondary | ICD-10-CM

## 2021-05-19 LAB — CUP PACEART REMOTE DEVICE CHECK
Battery Remaining Longevity: 66 mo
Battery Voltage: 2.99 V
Brady Statistic RV Percent Paced: 98 %
Date Time Interrogation Session: 20230425012404
HighPow Impedance: 59 Ohm
Implantable Lead Implant Date: 20120920
Implantable Lead Location: 753862
Implantable Pulse Generator Implant Date: 20190809
Lead Channel Impedance Value: 285 Ohm
Lead Channel Impedance Value: 342 Ohm
Lead Channel Pacing Threshold Amplitude: 0.625 V
Lead Channel Pacing Threshold Pulse Width: 0.4 ms
Lead Channel Sensing Intrinsic Amplitude: 8.625 mV
Lead Channel Sensing Intrinsic Amplitude: 8.625 mV
Lead Channel Setting Pacing Amplitude: 1.25 V
Lead Channel Setting Pacing Pulse Width: 0.4 ms
Lead Channel Setting Sensing Sensitivity: 0.3 mV

## 2021-05-20 ENCOUNTER — Ambulatory Visit
Admission: RE | Admit: 2021-05-20 | Discharge: 2021-05-20 | Disposition: A | Discharge status: Home / Self Care | Payer: Medicare HMO | Source: Ambulatory Visit | Attending: Internal Medicine | Admitting: Internal Medicine

## 2021-05-20 ENCOUNTER — Other Ambulatory Visit (HOSPITAL_COMMUNITY)
Admission: RE | Admit: 2021-05-20 | Discharge: 2021-05-20 | Disposition: A | Discharge status: Home / Self Care | Payer: Medicare HMO | Source: Ambulatory Visit | Attending: Internal Medicine | Admitting: Internal Medicine

## 2021-05-20 DIAGNOSIS — E042 Nontoxic multinodular goiter: Secondary | ICD-10-CM

## 2021-05-20 DIAGNOSIS — E041 Nontoxic single thyroid nodule: Secondary | ICD-10-CM | POA: Diagnosis present

## 2021-05-20 NOTE — Procedures (Signed)
Successful US guided FNA of Left mid thyroid nodule ?No complications. ?See PACS for full report. ? ? ? ?Alex Gardener, AGNP-BC ?05/20/2021, 3:21 PM ? ?  ? ?

## 2021-05-21 LAB — CYTOLOGY - NON PAP

## 2021-05-22 ENCOUNTER — Telehealth: Payer: Self-pay

## 2021-05-22 DIAGNOSIS — R4189 Other symptoms and signs involving cognitive functions and awareness: Secondary | ICD-10-CM

## 2021-05-22 NOTE — Telephone Encounter (Signed)
Per Last note 04/29/21: Patient mentioned she was advise her leads ate broken, ?Device information ?MDT single chamber ICD implanted 10/15/2010, gen change 09/02/2017 ?There is mention of ?damage to one of her device leads during her valvular surgery ?Secondary prevention indication with reports of torsades/VT ?In review of her CXRs, appears she has an abandoned RA lead and a broken/abandoned duakl coils RV lead. 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 ? ? ? ?Per Theodoro Doing in echo lab: ?Since the patient mentioned as well as the CXR showed a Broken or abounded lead patient will need to have a CT of head instead. Not MRI eligible. ? ? ?Patient daughter of note and what was recommended: ?Patient can go back under to have the broken leads removed or ?She can have the recommended imaging CT head. ? ?Will call the family back once dr.Jaffe reviews the note.  ? ? ?Please advise  ?

## 2021-05-22 NOTE — Telephone Encounter (Signed)
Patient daughter advised that we will have to order a CT head and call and get approved.  ?

## 2021-05-22 NOTE — Telephone Encounter (Signed)
Lindsey Huber had questions about the pt device and her leads. I let her speak with Lindsey Manis, rn. ?

## 2021-05-25 NOTE — Addendum Note (Signed)
Addended by: Venetia Night on: 05/25/2021 08:52 AM ? ? Modules accepted: Orders ? ?

## 2021-06-03 NOTE — Progress Notes (Signed)
Remote ICD transmission.   

## 2021-06-15 ENCOUNTER — Ambulatory Visit
Admission: RE | Admit: 2021-06-15 | Discharge: 2021-06-15 | Disposition: A | Discharge status: Home / Self Care | Payer: Medicare HMO | Source: Ambulatory Visit | Attending: Internal Medicine | Admitting: Internal Medicine

## 2021-06-15 ENCOUNTER — Other Ambulatory Visit: Payer: Self-pay | Admitting: Internal Medicine

## 2021-06-15 DIAGNOSIS — M79661 Pain in right lower leg: Secondary | ICD-10-CM

## 2021-06-17 ENCOUNTER — Ambulatory Visit (INDEPENDENT_AMBULATORY_CARE_PROVIDER_SITE_OTHER): Payer: Medicare HMO

## 2021-06-17 ENCOUNTER — Ambulatory Visit (INDEPENDENT_AMBULATORY_CARE_PROVIDER_SITE_OTHER): Payer: Medicare HMO | Admitting: Surgical

## 2021-06-17 ENCOUNTER — Encounter: Payer: Self-pay | Admitting: Orthopedic Surgery

## 2021-06-17 VITALS — Ht 62.25 in | Wt 119.2 lb

## 2021-06-17 DIAGNOSIS — M25551 Pain in right hip: Secondary | ICD-10-CM

## 2021-06-17 NOTE — Progress Notes (Signed)
Office Visit Note   Patient: Lindsey Huber           Date of Birth: [birth year]           MRN: [id number] Visit Date: 06/17/2021 Requested by: Scheryl Marten, [place],  [place] [id number] PCP: Sabra Heck, [place], [name]  Subjective: Chief Complaint  Patient presents with   Right Hip - Pain    HPI: Lindsey Huber is a 76 y.o. female who presents to the office complaining of right hip pain.  Patient states that she fell about 1 week ago onto her right side.  She developed a hematoma following the fall.  She had bruising at the time but has not really had any alteration to her gait.  No pain at night and pain is significantly improving.  Denies any groin pain, numbness/tingling, low back pain, pain in her knee.  No other joints are really bothering her.  Taking Tylenol for pain control.  Denies any fevers, chills, night sweats.  She also was noted by her PCP to have significant edema through the right lower extremity in the calf.  She had subsequent ultrasound demonstrating no DVT.  No history of prior right hip surgery.  Using a cane for ambulation.  Takes Xarelto.                ROS: All systems reviewed are negative as they relate to the chief complaint within the history of present illness.  Patient denies fevers or chills.  Assessment & Plan: Visit Diagnoses:  1. Pain in right hip     Plan: Patient is a 76 year old female who presents for evaluation of right hip pain.  She has no hematoma from fall against her right hip that was previously not there before the fall.  She had some lower extremity edema but this is significantly improved with her compression stockings.  Radiographs of the right hip are negative for any fracture or dislocation.  She ambulates without any significant antalgia.  Recommended that she and her family monitor the hematoma and would expect that it resolves within the next 1 to 2 months.  If there is no resolution or she develops  concerning signs/symptoms of infection which were discussed today then she will reach back out to the office.  Patient agreed with plan.  Follow-up as needed.  Follow-Up Instructions: No follow-ups on file.   Orders:  Orders Placed This Encounter  Procedures   XR HIP UNILAT W OR W/O PELVIS 2-3 VIEWS RIGHT   No orders of the defined types were placed in this encounter.     Procedures: No procedures performed   Clinical Data: No additional findings.  Objective: Vital Signs: Ht 5' 2.25" (1.581 m)   Wt 119 lb 3.2 oz (54.1 kg)   BMI 21.63 kg/m   Physical Exam:  Constitutional: Patient appears well-developed HEENT:  Head: Normocephalic Eyes:EOM are normal Neck: Normal range of motion Cardiovascular: Normal rate Pulmonary/chest: Effort normal Neurologic: Patient is alert Skin: Skin is warm Psychiatric: Patient has normal mood and affect  Ortho Exam: Ortho exam demonstrates right hip with no pain with internal rotation, external rotation, hip flexion.  She has a firm somewhat compressible but nontender hematoma in the lateral right hip.  She states that this is only been there in the last week since her fall.  There is no increased warmth compared with contralateral side.  Negative Stinchfield sign.  No calf tenderness.  Negative Homans' sign.  Intact ankle dorsiflexion, plantarflexion, inversion, eversion.  No knee effusion noted.  Specialty Comments:  No specialty comments available.  Imaging: No results found.   PMFS History: Patient Active Problem List   Diagnosis Date Noted   Falls frequently 04/09/2021   Hypotension 12/14/2020   Hypokalemia 12/14/2020   Hypocalcemia 12/14/2020   Hypomagnesemia 12/14/2020   Acute GI bleeding 12/13/2020   Multinodular goiter 08/20/2020   Acquired hypothyroidism 08/20/2020   Idiopathic gout of multiple sites 06/27/2020   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   Vitamin D deficiency 05/01/2020   Mammogram abnormal 05/01/2020    Proteinuria 05/01/2020   Other bilateral secondary osteoarthritis of knee 05/01/2020   Osteoarthritis of hip 05/01/2020   HTN (hypertension) 05/01/2020   GERD without esophagitis 05/01/2020   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[place]) 05/01/2020   Cardiomyopathy ([place]) 05/01/2020   Iatrogenic hypotension 05/01/2020   Hypercoagulability due to atrial fibrillation ([place]) 05/01/2020   Systemic lupus erythematosus ([place]) 05/01/2020   Chronic heart failure with preserved ejection fraction (HCC) 05/01/2020   Paroxysmal atrial fibrillation with RVR ([place]) 05/01/2020   Protein calorie malnutrition ([place]) 05/01/2020   Chronic kidney disease (CKD), active medical management without dialysis, stage 3 (moderate) ([place]) 05/01/2020   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[place]) 05/01/2020   Non-rheumatic atrial fibrillation ([place]) 05/01/2020   Atherosclerosis of abdominal aorta ([place]) 05/01/2020   Diverticulosis of colon 05/01/2020   Cholelithiasis 05/01/2020   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   Anemia of chronic disease 05/01/2020   Past Medical History:  Diagnosis Date   A-fib ([place])    Anemia    Cardiomyopathy ([place])    CHF (congestive heart failure) ([place])    CKD (chronic kidney disease)    Diverticulosis    Hypertension    Lupus ([place])    Osteoarthritis    Pulmonary fibrosis ([place])    Vitamin D deficiency     Family History  Problem Relation Age of Onset   Heart disease Mother    Hypertension Mother    Rheum arthritis Mother    Osteoarthritis Mother    Heart disease Father    Hypertension Father    Osteoarthritis Father    Heart disease Sister    Diabetes Brother    Cancer Brother     Past Surgical History:  Procedure Laterality Date   BREAST BIOPSY Left    CARDIAC VALVE SURGERY     CARPAL TUNNEL RELEASE     COLONOSCOPY WITH PROPOFOL N/A 12/20/2020   Procedure: COLONOSCOPY WITH PROPOFOL;  Surgeon: Arta Silence, MD;  Location: [facility] ENDOSCOPY;   Service: Endoscopy;  Laterality: N/A;   HEMORRHOID SURGERY     PACEMAKER INSERTION     RIGHT/LEFT HEART CATH AND CORONARY ANGIOGRAPHY N/A 11/27/2020   Procedure: RIGHT/LEFT HEART CATH AND CORONARY ANGIOGRAPHY;  Surgeon: Jolaine Artist, MD;  Location: [facility] CV LAB;  Service: Cardiovascular;  Laterality: N/A;   TONSILLECTOMY     Social History   Occupational History   Not on file  Tobacco Use   Smoking status: Never   Smokeless tobacco: Never  Vaping Use   Vaping Use: Never used  Substance and Sexual Activity   Alcohol use: Never   Drug use: Never   Sexual activity: Not on file

## 2021-06-18 ENCOUNTER — Emergency Department (HOSPITAL_BASED_OUTPATIENT_CLINIC_OR_DEPARTMENT_OTHER): Payer: Medicare HMO

## 2021-06-18 ENCOUNTER — Encounter (HOSPITAL_BASED_OUTPATIENT_CLINIC_OR_DEPARTMENT_OTHER): Payer: Self-pay | Admitting: Emergency Medicine

## 2021-06-18 ENCOUNTER — Emergency Department (HOSPITAL_BASED_OUTPATIENT_CLINIC_OR_DEPARTMENT_OTHER)
Admission: EM | Admit: 2021-06-18 | Discharge: 2021-06-18 | Disposition: A | Discharge status: Home / Self Care | Payer: Medicare HMO | Attending: Emergency Medicine | Admitting: Emergency Medicine

## 2021-06-18 DIAGNOSIS — I509 Heart failure, unspecified: Secondary | ICD-10-CM | POA: Diagnosis not present

## 2021-06-18 DIAGNOSIS — Z950 Presence of cardiac pacemaker: Secondary | ICD-10-CM | POA: Diagnosis not present

## 2021-06-18 DIAGNOSIS — Z79899 Other long term (current) drug therapy: Secondary | ICD-10-CM | POA: Insufficient documentation

## 2021-06-18 DIAGNOSIS — R0789 Other chest pain: Secondary | ICD-10-CM | POA: Insufficient documentation

## 2021-06-18 DIAGNOSIS — I4891 Unspecified atrial fibrillation: Secondary | ICD-10-CM | POA: Insufficient documentation

## 2021-06-18 DIAGNOSIS — I130 Hypertensive heart and chronic kidney disease with heart failure and stage 1 through stage 4 chronic kidney disease, or unspecified chronic kidney disease: Secondary | ICD-10-CM | POA: Insufficient documentation

## 2021-06-18 DIAGNOSIS — M25511 Pain in right shoulder: Secondary | ICD-10-CM | POA: Diagnosis not present

## 2021-06-18 DIAGNOSIS — N189 Chronic kidney disease, unspecified: Secondary | ICD-10-CM | POA: Insufficient documentation

## 2021-06-18 DIAGNOSIS — S0990XA Unspecified injury of head, initial encounter: Secondary | ICD-10-CM | POA: Insufficient documentation

## 2021-06-18 DIAGNOSIS — Z7901 Long term (current) use of anticoagulants: Secondary | ICD-10-CM | POA: Diagnosis not present

## 2021-06-18 DIAGNOSIS — W19XXXA Unspecified fall, initial encounter: Secondary | ICD-10-CM | POA: Insufficient documentation

## 2021-06-18 NOTE — ED Triage Notes (Signed)
Pt had mechanical fall x 2 hours pta. Hit right side of head. C/o fatigue. Denies change in vision, dizziness, loc. + Blood thinners. PERRL. Also states "I think the fall messed up my pacemaker". Has metronic pacemaker.

## 2021-06-18 NOTE — ED Notes (Signed)
Pt and daughter verbalized understanding of d/c instructions and follow up care. Pt wheeled out of ED via wheelchair.

## 2021-06-18 NOTE — ED Notes (Signed)
Patient transported to CT/XRAY 

## 2021-06-18 NOTE — ED Provider Notes (Signed)
[facility] EMERGENCY DEPARTMENT Provider Note   CSN: [id number] Arrival date & time: 06/18/21  1710     History  Chief Complaint  Patient presents with   Lindsey Huber is a 76 y.o. female.  The history is provided by the patient and medical records. No language interpreter was used.  Fall This is a recurrent problem. The current episode started 3 to 5 hours ago. The problem occurs rarely. The problem has not changed since onset.Associated symptoms include chest pain and headaches. Pertinent negatives include no abdominal pain and no shortness of breath. Nothing aggravates the symptoms. Nothing relieves the symptoms. She has tried nothing for the symptoms. The treatment provided no relief.      Home Medications Prior to Admission medications   Medication Sig Start Date End Date Taking? Authorizing Provider  acetaminophen (TYLENOL) 500 MG tablet Take 1,000 mg by mouth every 6 (six) hours as needed (pain).    [provider]  albuterol (VENTOLIN HFA) 108 (90 Base) MCG/ACT inhaler Inhale 2 puffs into the lungs every 6 (six) hours as needed for wheezing or shortness of breath.    [provider]  allopurinol (ZYLOPRIM) 100 MG tablet TAKE 1 TABLET BY MOUTH EVERY DAY 05/19/21   Rice, Resa Miner, MD  Budeson-Glycopyrrol-Formoterol (BREZTRI AEROSPHERE) 160-9-4.8 MCG/ACT AERO Inhale 2 puffs into the lungs 2 (two) times daily as needed (shortness of breath).    [provider]  Calcium Carbonate-Vitamin D (CALTRATE 600+D PO) Take 1 capsule by mouth daily with lunch.    [provider]  cetirizine HCl (ZYRTEC) 5 MG/5ML SOLN Take 5 mg by mouth at bedtime.    [provider]  Cholecalciferol 25 MCG (1000 UT) tablet Take 1,000 Units by mouth daily with lunch.    [provider]  ferrous sulfate 325 (65 FE) MG tablet Take 325 mg by mouth 2 (two) times daily with a meal.    [provider]  fluticasone (FLONASE)  50 MCG/ACT nasal spray Place 2 sprays into both nostrils daily as needed for allergies.    [provider]  folic acid (FOLVITE) Q000111Q MCG tablet Take 800 mcg by mouth daily with lunch.    [provider]  furosemide (LASIX) 20 MG tablet Take 20 mg by mouth See admin instructions. Take one tablet (20 mg) by mouth every morning, Hold if BP is lower than 110/60    [provider]  hydroxychloroquine (PLAQUENIL) 200 MG tablet Take 1 tablet (200 mg total) by mouth daily. 04/10/21   Rice, Resa Miner, MD  levothyroxine (SYNTHROID) 25 MCG tablet Take 1 tablet (25 mcg total) by mouth daily. 02/25/21   Shamleffer, Melanie Crazier, MD  metoprolol succinate (TOPROL-XL) 25 MG 24 hr tablet Take 1 tablet (25 mg total) by mouth daily. Hold dose if <110 SBP or <60 DBP 04/23/21   Bensimhon, Shaune Pascal, MD  Multiple Vitamins-Minerals (MULTIVITAMIN WITH MINERALS) tablet Take 1 tablet by mouth daily with lunch.    [provider]  Nintedanib (OFEV) 150 MG CAPS Take 150 mg by mouth at bedtime.    [provider]  predniSONE (DELTASONE) 5 MG tablet Take 1 tablet (5 mg total) by mouth daily with breakfast. Patient to double up dose during sick day rule 02/20/21   Shamleffer, Melanie Crazier, MD  Rivaroxaban (XARELTO) 15 MG TABS tablet Take 1 tablet (15 mg total) by mouth daily with supper. 04/23/21   Bensimhon, Shaune Pascal, MD  vitamin B-12 (CYANOCOBALAMIN) 1000  MCG tablet Take 1,000 mcg by mouth daily with lunch.    [provider]      Allergies    Propofol, Flecainide, Amiodarone, and Amlodipine    Review of Systems   Review of Systems  Constitutional:  Negative for chills, diaphoresis, fatigue and fever.  HENT:  Negative for congestion.   Eyes:  Negative for visual disturbance.  Respiratory:  Negative for cough, chest tightness, shortness of breath and wheezing.   Cardiovascular:  Positive for chest pain. Negative for palpitations and leg swelling.  Gastrointestinal:   Negative for abdominal pain, constipation, diarrhea, nausea and vomiting.  Genitourinary:  Negative for dysuria and flank pain.  Musculoskeletal:  Negative for back pain, neck pain and neck stiffness.  Skin:  Negative for rash and wound.  Neurological:  Positive for headaches. Negative for dizziness, weakness, light-headedness and numbness.  Psychiatric/Behavioral:  Negative for agitation and confusion.    Physical Exam Updated Vital Signs BP (!) 147/77 (BP Location: Left Arm)   Pulse 71   Temp 98.3 F (36.8 C) (Oral)   Resp 20   SpO2 95%  Physical Exam Vitals and nursing note reviewed.  Constitutional:      General: She is not in acute distress.    Appearance: She is well-developed. She is not ill-appearing, toxic-appearing or diaphoretic.  HENT:     Head: No abrasion, contusion or laceration.      Nose: No congestion or rhinorrhea.     Mouth/Throat:     Mouth: Mucous membranes are moist.     Pharynx: No oropharyngeal exudate or posterior oropharyngeal erythema.  Eyes:     Extraocular Movements: Extraocular movements intact.     Conjunctiva/sclera: Conjunctivae normal.     Pupils: Pupils are equal, round, and reactive to light.  Cardiovascular:     Rate and Rhythm: Normal rate and regular rhythm.     Heart sounds: No murmur heard. Pulmonary:     Effort: Pulmonary effort is normal. No respiratory distress.     Breath sounds: Normal breath sounds. No wheezing, rhonchi or rales.  Chest:     Chest wall: Tenderness present.    Abdominal:     General: Abdomen is flat.     Palpations: Abdomen is soft.     Tenderness: There is no abdominal tenderness. There is no guarding or rebound.  Musculoskeletal:        General: Tenderness present. No swelling.     Right shoulder: Tenderness present. No swelling, deformity or laceration. Normal pulse.       Arms:     Cervical back: Neck supple. No tenderness.     Left lower leg: No edema.  Skin:    General: Skin is warm and dry.      Capillary Refill: Capillary refill takes less than 2 seconds.     Findings: No erythema or rash.  Neurological:     General: No focal deficit present.     Mental Status: She is alert.     Sensory: No sensory deficit.     Motor: No weakness.  Psychiatric:        Mood and Affect: Mood normal.    ED Results / Procedures / Treatments   Labs (all labs ordered are listed, but only abnormal results are displayed) Labs Reviewed - No data to display  EKG None  Radiology DG Chest 2 View  Result Date: 06/18/2021 CLINICAL DATA:  Fall EXAM: CHEST - 2 VIEW COMPARISON:  05/12/2020, 12/13/2020 FINDINGS: Left-sided pacing device remains  in place with single disconnected ICD lead as before. Leads project over right atrium and right ventricle tip of right atrial lead appears more curvilinear but a similar appearance is noted on the April examination. The position of the lead on the lateral view is stable. Post sternotomy changes with multiple valve prosthesis. Cardiomegaly without pleural effusion or pneumothorax. IMPRESSION: 1. Left-sided pacing device grossly similar in position when allowing for differences in patient positioning. 2. Cardiomegaly with slight central congestion but no overt pulmonary edema Electronically Signed   By: Jasmine Pang M.D.   On: 06/18/2021 18:35   DG Shoulder Right  Result Date: 06/18/2021 CLINICAL DATA:  Fall, RIGHT shoulder pain. EXAM: RIGHT SHOULDER - 2+ VIEW COMPARISON:  Chest x-ray of the same date. FINDINGS: RIGHT shoulder is located without signs of fracture. Scapula is unremarkable as is visualized clavicle. Overlying soft tissues are unremarkable. Mild acromioclavicular changes are noted. Mild degenerative changes of the glenohumeral joint also present. IMPRESSION: No acute findings. Mild degenerative changes. Electronically Signed   By: Donzetta Kohut M.D.   On: 06/18/2021 18:35   CT Head Wo Contrast  Result Date: 06/18/2021 CLINICAL DATA:  Hit head on Xarelto  EXAM: CT HEAD WITHOUT CONTRAST TECHNIQUE: Contiguous axial images were obtained from the base of the skull through the vertex without intravenous contrast. RADIATION DOSE REDUCTION: This exam was performed according to the departmental dose-optimization program which includes automated exposure control, adjustment of the mA and/or kV according to patient size and/or use of iterative reconstruction technique. COMPARISON:  CT brain 03/14/2021 FINDINGS: Brain: No acute territorial infarction, hemorrhage or intracranial mass. Mild atrophy. Moderate patchy white matter hypodensity consistent with chronic small vessel ischemic change. Ventricles are stable in size Vascular: No hyperdense vessels.  Carotid vascular calcification Skull: Normal. Negative for fracture or focal lesion. Sinuses/Orbits: No acute finding. Other: None IMPRESSION: 1. No CT evidence for acute intracranial abnormality. 2. Atrophy and chronic small vessel ischemic changes of the white matter Electronically Signed   By: Jasmine Pang M.D.   On: 06/18/2021 18:38   XR HIP UNILAT W OR W/O PELVIS 2-3 VIEWS RIGHT  Result Date: 06/17/2021 AP and frog lateral view of right hip reviewed.  No fracture or dislocation noted.  Mild degenerative changes of the right hip joint.  There is soft tissue swelling noted over the lateral aspect of the right hip compared with the left.   Procedures Procedures    Medications Ordered in ED Medications - No data to display  ED Course/ Medical Decision Making/ A&P                           Medical Decision Making Amount and/or Complexity of Data Reviewed Radiology: ordered.    Lindsey Huber is a 76 y.o. female with a past medical history significant for CHF with pacemaker, atrial fibrillation on Xarelto, pulmonary fibrosis, CKD, hypertension, chronic anemia, lupus, and arthritis who presents with fall.  According to patient, she got tripped up getting out of the car this afternoon and hit her right  temporal area on the car panel as she went to the ground.  She did not bounce her head off the ground and did not lose consciousness.  She reports pain in her right head and right temple area as well as pain in her right shoulder.  She also thought that her left chest was struck as she went down in her pacemaker feels like it shifted.  She reports  that she is not having any active chest pain, shortness of breath or palpitations.  Her pacemaker appears to be working normally on the monitor.  She denies any vision changes, nausea, vomiting, or numbness, tingling, or weakness of extremities.  She recently had work-up for her right hip after a fall and did not show any acute fractures at that time.  She is denying any other complaints at this time including no difficulty with speech, confusion, or other complaints.  On exam, patient has tenderness in her right temple but otherwise no laceration or hematoma seen.  Symmetric smile.  Clear speech.  Pupils are symmetric and reactive with normal extraocular movements.  Normal sensation and strength in extremities.  She does have some tenderness in her right shoulder and it is painful when I move around.  It does not appear dislocated on exam.  Patient had some tenderness on her left chest but had clear breath sounds.  Back nontender.  Neck nontender.  Exam otherwise unremarkable.  Had a shared decision-making conversation and given her lack of any preceding symptoms, we agree to hold on more extensive metabolic work-up but will get CT of the head given her fall on Xarelto, x-ray of her shoulder with shoulder pain, and x-ray of her chest given the left chest discomfort and feeling that her pacemaker may have shifted.  [employer] called to report that her pacemaker is functioning properly and they do not see any evidence of problem with it.  Anticipate discharge home if work-up is reassuring.  6:56 PM Imaging of the head, shoulder and chest are reassuring.  Patient  is feeling well and agrees with discharge home.  She will follow-up with her PCP and understood return precautions.  Patient discharged in good condition after reassuring work-up.        Final Clinical Impression(s) / ED Diagnoses Final diagnoses:  Fall, initial encounter  Injury of head, initial encounter  Acute pain of right shoulder    Rx / DC Orders ED Discharge Orders     None       Clinical Impression: 1. Fall, initial encounter   2. Injury of head, initial encounter   3. Acute pain of right shoulder     Disposition: Discharge  Condition: Good  I have discussed the results, Dx and Tx plan with the pt(& family if present). He/she/they expressed understanding and agree(s) with the plan. Discharge instructions discussed at great length. Strict return precautions discussed and pt &/or family have verbalized understanding of the instructions. No further questions at time of discharge.    New Prescriptions   No medications on file    Follow Up: Scheryl Marten, [place] [address] [phone]     [facility] EMERGENCY DEPARTMENT [address] [id number] [place]       Izabel Chim, Gwenyth Allegra, MD 06/18/21 [phone]

## 2021-06-18 NOTE — Discharge Instructions (Signed)
Your history, exam, and imaging today were overall reassuring.  I suspect you have mild musculoskeletal and soft tissue injuries after the fall but we did not find any critical injuries on the imaging today.  We feel you are safe for discharge home.  Please follow-up with your primary doctor.  If any symptoms change or worsen acutely, please turn to the nearest emergency department.

## 2021-07-01 ENCOUNTER — Telehealth: Payer: Self-pay

## 2021-07-01 NOTE — Telephone Encounter (Signed)
Precert not Required.   CTA appt scheduled for 07/07/21 at 9 am. Patient to arrive at 8:30 am.  Patient advised.

## 2021-07-03 ENCOUNTER — Telehealth: Payer: Self-pay | Admitting: Neurology

## 2021-07-03 NOTE — Telephone Encounter (Signed)
Per Call with Insurance on 07/01/21 Rep Dorthula Rue. Ref# [id number] no Precert Required.

## 2021-07-03 NOTE — Telephone Encounter (Signed)
Melissa called stating that there is no authorization for this MRI.  Please call her back for this.

## 2021-07-03 NOTE — Telephone Encounter (Signed)
Telephone call to [employer] and [employer], Patient needs a Pa for CT head, Re faxed a Appeal letter and records.

## 2021-07-06 NOTE — Telephone Encounter (Signed)
Telephone call from [place] at [facility].  Appeal approved they have already notified the patient.  Auth # [id number].   Telephone to [facility] at [facility] number given.

## 2021-07-07 ENCOUNTER — Ambulatory Visit (HOSPITAL_COMMUNITY): Payer: Medicare HMO

## 2021-07-14 ENCOUNTER — Ambulatory Visit (HOSPITAL_COMMUNITY)
Admission: RE | Admit: 2021-07-14 | Discharge: 2021-07-14 | Disposition: A | Discharge status: Home / Self Care | Payer: Medicare HMO | Source: Ambulatory Visit | Attending: Neurology | Admitting: Neurology

## 2021-07-14 DIAGNOSIS — I6782 Cerebral ischemia: Secondary | ICD-10-CM | POA: Diagnosis not present

## 2021-07-14 DIAGNOSIS — R4189 Other symptoms and signs involving cognitive functions and awareness: Secondary | ICD-10-CM | POA: Insufficient documentation

## 2021-07-14 DIAGNOSIS — R413 Other amnesia: Secondary | ICD-10-CM | POA: Insufficient documentation

## 2021-07-15 NOTE — Progress Notes (Signed)
Office Visit Note  Patient: Lindsey Huber             Date of Birth: 1945/04/01           MRN: [id number]             PCP: Scheryl Marten, PA Referring: Scheryl Marten, [name] Visit Date: 07/16/2021   Subjective:   History of Present Illness: Lindsey Huber is a 76 y.o. female here for follow up for SLE, OA, and gouty arthritis on HCQ 200 mg daily and prednisone 5 mg and allopurinol 100 mg daily. No major flare up with lupus symptoms no joint effusion. She has persistent mild pedal edema. She fell again in May struck her right temple area on she side of the car and also chest and shoulder pain. No major complications identified. She has not yet started with PT for her weakness, pain, and gait difficulty.   Previous HPI 04/09/21 Lindsey Huber is a 76 y.o. female here for follow up for systemic lupus and gout on HCQ 200 mg daily and allopurinol 100 mg daily. She takes prednisone 5 mg daily as well for adrenal insufficiency. She has had multiple falls since last visit most recently about a week ago. This is most often from her leg typically right knee suddenly giving way without preceding signs or symptoms. She fell onto her right hip. Also fell recently landing on left elbow with residual bruising around that area. No new gout attacks or joint swelling besides from the bruising.   Previous HPI 01/05/21 Lindsey Huber is a 76 y.o. female here for follow up for SLE with inflammatory arthritis and gout on hydroxychloroquine 200 mg daily and allopurinol 100 mg. 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  Since our last visit she had a significant event with hospitalization for hematochezia work-up identified a diverticular bleed.  She did have sufficient anemia required blood transfusion otherwise no major complications.  She has had no major change in pulmonary symptoms no new skin rashes or swelling.  Does have mild pedal edema.  Currently  she is experiencing some increased knee pain and stiffness.   Previous HPI: 06/27/20 Lindsey Huber is a 76 y.o. female here to establish care for systemic lupus and gouty arthritis currently on hydroxychloroquine 200 mg p.o. daily and allopurinol 100 mg p.o. daily.  She was originally diagnosed due to symptoms of multiple joint pains with orthopedic surgery evaluation in [place] with additional work-up revealing for systemic lupus.  She has previously taken steroids and was on low-dose prednisone 5 mg daily for quite some time although discontinued at her most recent hospitalization.  She has had episodic gout flares involving both feet.  Lupus symptoms have been predominantly arthritis possibly a overlap with rheumatoid arthritis with no known history of lupus nephritis.  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  She has also had intra-articular steroid injection of the knees for arthritis symptoms with reasonably good benefit.  Her left knee has had some frequent slipping or instability reported she describes a fall last week despite routine use of a walker and sometimes knee brace for this.   Review of Systems  Constitutional:  Positive for fatigue.  HENT:  Positive for mouth dryness.   Eyes:  Positive for dryness.  Respiratory:  Positive for shortness of breath.   Cardiovascular:  Negative for swelling in legs/feet.  Gastrointestinal:  Positive for  constipation.  Endocrine: Positive for cold intolerance and increased urination.  Genitourinary:  Negative for difficulty urinating.  Musculoskeletal:  Positive for joint pain, gait problem, joint pain, joint swelling, muscle weakness and morning stiffness.  Skin:  Negative for rash.  Allergic/Immunologic: Negative for susceptible to infections.  Neurological:  Positive for numbness and weakness.  Hematological:  Positive for bruising/bleeding tendency.  Psychiatric/Behavioral:  Positive for  sleep disturbance.     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   Vitamin D deficiency 05/01/2020   Mammogram abnormal 05/01/2020   Proteinuria 05/01/2020   Other bilateral secondary osteoarthritis of knee 05/01/2020   Osteoarthritis of hip 05/01/2020   HTN (hypertension) 05/01/2020   GERD without esophagitis 05/01/2020   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HCC) 05/01/2020   Cardiomyopathy (HCC) 05/01/2020   Iatrogenic hypotension 05/01/2020   Hypercoagulability due to atrial fibrillation (HCC) 05/01/2020   Systemic lupus erythematosus (HCC) 05/01/2020   Chronic heart failure with preserved ejection fraction (HCC) 05/01/2020   Paroxysmal atrial fibrillation with RVR (HCC) 05/01/2020   Protein calorie malnutrition (HCC) 05/01/2020   Chronic kidney disease (CKD), active medical management without dialysis, stage 3 (moderate) (HCC) 05/01/2020   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HCC) 05/01/2020   Non-rheumatic atrial fibrillation (HCC) 05/01/2020   Atherosclerosis of abdominal aorta (HCC) 05/01/2020   Diverticulosis of colon 05/01/2020   Cholelithiasis 05/01/2020   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   Anemia of chronic disease 05/01/2020    Past Medical History:  Diagnosis Date   A-fib (HCC)    Anemia    Cardiomyopathy (HCC)    CHF (congestive heart failure) (HCC)    CKD (chronic kidney disease)    Diverticulosis    Hypertension    Lupus (HCC)    Osteoarthritis    Pulmonary fibrosis (HCC)    Vitamin D deficiency     Family History  Problem Relation Age of Onset   Heart disease  Mother    Hypertension Mother    Rheum arthritis Mother    Osteoarthritis Mother    Heart disease Father    Hypertension Father    Osteoarthritis Father    Heart disease Sister    Diabetes Brother    Cancer Brother    Past Surgical History:  Procedure Laterality Date   BREAST BIOPSY Left    CARDIAC VALVE SURGERY     CARPAL TUNNEL RELEASE     COLONOSCOPY WITH PROPOFOL N/A 12/20/2020   Procedure: COLONOSCOPY WITH PROPOFOL;  Surgeon: Willis Modena, MD;  Location: [facility] ENDOSCOPY;  Service: Endoscopy;  Laterality: N/A;   HEMORRHOID SURGERY     PACEMAKER INSERTION     RIGHT/LEFT HEART CATH AND CORONARY ANGIOGRAPHY N/A 11/27/2020   Procedure: RIGHT/LEFT HEART CATH AND CORONARY ANGIOGRAPHY;  Surgeon: Dolores Patty, MD;  Location: [facility] INVASIVE CV LAB;  Service: Cardiovascular;  Laterality: N/A;   TONSILLECTOMY     Social History   Social History Narrative   Not on file   Immunization History  Administered Date(s) Administered   Influenza-Unspecified 10/26/2019   PFIZER(Purple Top)SARS-COV-2 Vaccination 04/17/2019, 05/08/2019     Objective: Vital Signs: BP (!) 167/84 (BP Location: Left Arm, Patient Position: Sitting, Cuff  Size: Small)   Pulse 86   Resp 13   Ht 5\' 3"  (1.6 m)   Wt 124 lb (56.2 kg)   BMI 21.97 kg/m    Physical Exam Cardiovascular:     Rate and Rhythm: Normal rate and regular rhythm.  Pulmonary:     Effort: Pulmonary effort is normal.     Comments: Inspiratory crackles Musculoskeletal:     Comments: Trace pedal edema  Neurological:     Mental Status: She is alert.  Psychiatric:        Mood and Affect: Mood normal.      Musculoskeletal Exam:  Shoulders full ROM no tenderness or swelling Elbows full ROM no tenderness or swelling Wrists full ROM no tenderness or swelling Fingers full ROM no tenderness or swelling Knees full ROM, mild joint line tenderness to pressure, patellofemoral crepitus, no palpable effusions Ankles full ROM no tenderness or  swelling  Investigation: No additional findings.  Imaging: CT HEAD WO CONTRAST (5MM)  Result Date: 07/15/2021 CLINICAL DATA:  Memory loss, cognitive deficit EXAM: CT HEAD WITHOUT CONTRAST TECHNIQUE: Contiguous axial images were obtained from the base of the skull through the vertex without intravenous contrast. RADIATION DOSE REDUCTION: This exam was performed according to the departmental dose-optimization program which includes automated exposure control, adjustment of the mA and/or kV according to patient size and/or use of iterative reconstruction technique. COMPARISON:  06/18/2021 FINDINGS: Brain: Ventricle size and cerebral volume normal for age. Moderate white matter changes with patchy white matter hypodensity bilaterally, unchanged. Negative for acute infarct, hemorrhage, mass. Physiologic calcification basal ganglia bilaterally is unchanged. Vascular: Negative for hyperdense vessel Skull: Negative Sinuses/Orbits: Negative Other: None IMPRESSION: Moderate chronic microvascular ischemic change in the white matter. No acute abnormality no change from the recent study. Cerebral volume normal for age. Electronically Signed   By: Franchot Gallo M.D.   On: 07/15/2021 15:39   CT Head Wo Contrast  Result Date: 06/18/2021 CLINICAL DATA:  Hit head on Xarelto EXAM: CT HEAD WITHOUT CONTRAST TECHNIQUE: Contiguous axial images were obtained from the base of the skull through the vertex without intravenous contrast. RADIATION DOSE REDUCTION: This exam was performed according to the departmental dose-optimization program which includes automated exposure control, adjustment of the mA and/or kV according to patient size and/or use of iterative reconstruction technique. COMPARISON:  CT brain 03/14/2021 FINDINGS: Brain: No acute territorial infarction, hemorrhage or intracranial mass. Mild atrophy. Moderate patchy white matter hypodensity consistent with chronic small vessel ischemic change. Ventricles are stable  in size Vascular: No hyperdense vessels.  Carotid vascular calcification Skull: Normal. Negative for fracture or focal lesion. Sinuses/Orbits: No acute finding. Other: None IMPRESSION: 1. No CT evidence for acute intracranial abnormality. 2. Atrophy and chronic small vessel ischemic changes of the white matter Electronically Signed   By: Donavan Foil M.D.   On: 06/18/2021 18:38   DG Shoulder Right  Result Date: 06/18/2021 CLINICAL DATA:  Fall, RIGHT shoulder pain. EXAM: RIGHT SHOULDER - 2+ VIEW COMPARISON:  Chest x-ray of the same date. FINDINGS: RIGHT shoulder is located without signs of fracture. Scapula is unremarkable as is visualized clavicle. Overlying soft tissues are unremarkable. Mild acromioclavicular changes are noted. Mild degenerative changes of the glenohumeral joint also present. IMPRESSION: No acute findings. Mild degenerative changes. Electronically Signed   By: Zetta Bills M.D.   On: 06/18/2021 18:35   DG Chest 2 View  Result Date: 06/18/2021 CLINICAL DATA:  Fall EXAM: CHEST - 2 VIEW COMPARISON:  05/12/2020, 12/13/2020 FINDINGS: Left-sided  pacing device remains in place with single disconnected ICD lead as before. Leads project over right atrium and right ventricle tip of right atrial lead appears more curvilinear but a similar appearance is noted on the April examination. The position of the lead on the lateral view is stable. Post sternotomy changes with multiple valve prosthesis. Cardiomegaly without pleural effusion or pneumothorax. IMPRESSION: 1. Left-sided pacing device grossly similar in position when allowing for differences in patient positioning. 2. Cardiomegaly with slight central congestion but no overt pulmonary edema Electronically Signed   By: Jasmine Pang M.D.   On: 06/18/2021 18:35   XR HIP UNILAT W OR W/O PELVIS 2-3 VIEWS RIGHT  Result Date: 06/17/2021 AP and frog lateral view of right hip reviewed.  No fracture or dislocation noted.  Mild degenerative changes of  the right hip joint.  There is soft tissue swelling noted over the lateral aspect of the right hip compared with the left.   Recent Labs: Lab Results  Component Value Date   WBC 6.5 03/14/2021   HGB 10.6 (L) 03/14/2021   PLT 144 (L) 03/14/2021   NA 140 03/14/2021   K 3.7 03/14/2021   CL 103 03/14/2021   CO2 28 03/14/2021   GLUCOSE 89 03/14/2021   BUN 26 (H) 03/14/2021   CREATININE 0.92 03/14/2021   BILITOT 0.5 03/14/2021   ALKPHOS 40 03/14/2021   AST 32 03/14/2021   ALT 17 03/14/2021   PROT 7.5 03/14/2021   ALBUMIN 3.1 (L) 03/14/2021   CALCIUM 9.2 03/14/2021    Speciality Comments: PLQ Eye Exam- 09/19/2020, nothing abnormal related to PLQ F/u in 12 months  Procedures:  No procedures performed Allergies: Propofol, Flecainide, Amiodarone, and Amlodipine   Assessment / Plan:     Visit Diagnoses: Systemic lupus erythematosus, unspecified SLE type, unspecified organ involvement status (HCC)  Symptoms appear to be very stable overall.  Planning to continue the hydroxychloroquine 200 mg daily and prednisone 5 mg daily.  Most recent labs have been okay and without reported exacerbations.  She is difficult for lab draws and prefer to avoid this unless necessary okay to recheck within 6 months on current regimen.  May also need repeated urine studies at that time.  Idiopathic chronic gout of multiple sites without tophus  Symptoms appear stable does not report gouty exacerbations since our visit.  Continue allopurinol 100 mg daily.  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HCC)  Falls frequently - Plan: Ambulatory referral to Physical Therapy  She has had several falls occurring pretty recently.  Generalized deconditioning related to comorbidities arthritis and respiratory limitations on her exertion.  Increased fracture risk due to age comorbidities and long-term low-dose glucocorticoid use.  Think she would benefit from referral to physical therapy for gait training.  Orders: Orders  Placed This Encounter  Procedures   Ambulatory referral to Physical Therapy   No orders of the defined types were placed in this encounter.    Follow-Up Instructions: Return in about 3 months (around 10/16/2021) for SLE/OA on HCQ/GC f/u 96mos.   Fuller Plan, MD  Note - This record has been created using [employer].  Chart creation errors have been sought, but may not always  have been located. Such creation errors do not reflect on  the standard of medical care.

## 2021-07-16 ENCOUNTER — Ambulatory Visit (INDEPENDENT_AMBULATORY_CARE_PROVIDER_SITE_OTHER): Payer: Medicare HMO | Admitting: Internal Medicine

## 2021-07-16 ENCOUNTER — Encounter: Payer: Self-pay | Admitting: Internal Medicine

## 2021-07-16 VITALS — BP 167/84 | HR 86 | Resp 13 | Ht 63.0 in | Wt 124.0 lb

## 2021-07-16 DIAGNOSIS — R296 Repeated falls: Secondary | ICD-10-CM | POA: Diagnosis not present

## 2021-07-16 DIAGNOSIS — M1A09X0 Idiopathic chronic gout, multiple sites, without tophus (tophi): Secondary | ICD-10-CM

## 2021-07-16 DIAGNOSIS — M329 Systemic lupus erythematosus, unspecified: Secondary | ICD-10-CM

## 2021-07-16 DIAGNOSIS — J84112 Idiopathic pulmonary fibrosis: Secondary | ICD-10-CM | POA: Diagnosis not present

## 2021-07-20 ENCOUNTER — Other Ambulatory Visit: Payer: Self-pay | Admitting: Internal Medicine

## 2021-07-20 DIAGNOSIS — M1A09X0 Idiopathic chronic gout, multiple sites, without tophus (tophi): Secondary | ICD-10-CM

## 2021-07-21 NOTE — Telephone Encounter (Signed)
Next Visit: 10/20/2021  Last Visit: 07/16/2021  Last Fill: 05/19/2021  DX: Idiopathic chronic gout of multiple sites without tophus  Current Dose per office note 07/16/2021: allopurinol 100 mg daily  Labs: 03/14/2021 BUN 26, Albumin 3.1, RBC 3.30, Hgb 10.6, Hct 32.8, Platelets 144  Okay to refill Allopurinol?

## 2021-07-22 ENCOUNTER — Ambulatory Visit: Payer: Medicare HMO | Attending: Internal Medicine

## 2021-07-22 DIAGNOSIS — R296 Repeated falls: Secondary | ICD-10-CM | POA: Diagnosis present

## 2021-07-22 DIAGNOSIS — R2689 Other abnormalities of gait and mobility: Secondary | ICD-10-CM

## 2021-07-22 DIAGNOSIS — M6281 Muscle weakness (generalized): Secondary | ICD-10-CM | POA: Diagnosis present

## 2021-07-22 DIAGNOSIS — R262 Difficulty in walking, not elsewhere classified: Secondary | ICD-10-CM | POA: Diagnosis present

## 2021-07-22 DIAGNOSIS — R293 Abnormal posture: Secondary | ICD-10-CM

## 2021-07-22 NOTE — Therapy (Signed)
OUTPATIENT PHYSICAL THERAPY NEURO EVALUATION   Patient Name: Lindsey Huber MRN: [id number] DOB:Jul 09, 1945, 76 y.o., female Today's Date: 07/22/2021   PCP: Evangeline Dakin REFERRING PROVIDER: Sheliah Hatch   PT End of Session - 07/22/21 1639     Visit Number 1    Date for PT Re-Evaluation 10/07/21    Progress Note Due on Visit 10    PT Start Time 1615    PT Stop Time 1700    PT Time Calculation (min) 45 min    Equipment Utilized During Treatment Gait belt    Activity Tolerance Patient tolerated treatment well;Patient limited by pain;Patient limited by fatigue             Past Medical History:  Diagnosis Date   A-fib (HCC)    Anemia    Cardiomyopathy (HCC)    CHF (congestive heart failure) (HCC)    CKD (chronic kidney disease)    Diverticulosis    Hypertension    Lupus (HCC)    Osteoarthritis    Pulmonary fibrosis (HCC)    Vitamin D deficiency    Past Surgical History:  Procedure Laterality Date   BREAST BIOPSY Left    CARDIAC VALVE SURGERY     CARPAL TUNNEL RELEASE     COLONOSCOPY WITH PROPOFOL N/A 12/20/2020   Procedure: COLONOSCOPY WITH PROPOFOL;  Surgeon: Willis Modena, MD;  Location: [facility] ENDOSCOPY;  Service: Endoscopy;  Laterality: N/A;   HEMORRHOID SURGERY     PACEMAKER INSERTION     RIGHT/LEFT HEART CATH AND CORONARY ANGIOGRAPHY N/A 11/27/2020   Procedure: RIGHT/LEFT HEART CATH AND CORONARY ANGIOGRAPHY;  Surgeon: Dolores Patty, MD;  Location: [facility] INVASIVE CV LAB;  Service: Cardiovascular;  Laterality: N/A;   TONSILLECTOMY     Patient Active Problem List   Diagnosis Date Noted   Falls frequently 04/09/2021   Hypotension 12/14/2020   Hypokalemia 12/14/2020   Hypocalcemia 12/14/2020   Hypomagnesemia 12/14/2020   Acute GI bleeding 12/13/2020   Multinodular goiter 08/20/2020   Acquired hypothyroidism 08/20/2020   Idiopathic gout of multiple sites 06/27/2020   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   Vitamin D deficiency 05/01/2020    Mammogram abnormal 05/01/2020   Proteinuria 05/01/2020   Other bilateral secondary osteoarthritis of knee 05/01/2020   Osteoarthritis of hip 05/01/2020   HTN (hypertension) 05/01/2020   GERD without esophagitis 05/01/2020   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HCC) 05/01/2020   Cardiomyopathy (HCC) 05/01/2020   Iatrogenic hypotension 05/01/2020   Hypercoagulability due to atrial fibrillation (HCC) 05/01/2020   Systemic lupus erythematosus (HCC) 05/01/2020   Chronic heart failure with preserved ejection fraction (HCC) 05/01/2020   Paroxysmal atrial fibrillation with RVR (HCC) 05/01/2020   Protein calorie malnutrition (HCC) 05/01/2020   Chronic kidney disease (CKD), active medical management without dialysis, stage 3 (moderate) (HCC) 05/01/2020   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HCC) 05/01/2020   Non-rheumatic atrial fibrillation (HCC) 05/01/2020   Atherosclerosis of abdominal aorta (HCC) 05/01/2020   Diverticulosis of colon 05/01/2020   Cholelithiasis 05/01/2020   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   Anemia of chronic disease 05/01/2020    ONSET DATE: 06/18/21  REFERRING DIAG: R29.6  THERAPY DIAG:  Repeated falls  Difficulty in walking, not elsewhere classified  Other abnormalities of gait and mobility  Muscle weakness (generalized)  Abnormal posture  Rationale for Evaluation and Treatment Rehabilitation  SUBJECTIVE:  SUBJECTIVE STATEMENT: Both of my knees give out sometimes and I fall. This has not happened for a couple of weeks. Patient is having reoccurring falls and pain in both knees from arthritis and difficulty walking without fear of falling.   Pt accompanied by: family member  PERTINENT HISTORY: she has a pacemaker, CVD,  angiographs 2022, systemic lupus, CHF   PAIN:  Are you having pain? Yes: NPRS scale: 7/10 Pain location: both knees Pain description: achy Aggravating factors: trying to get up, moving, walking Relieving factors: tylenol  PRECAUTIONS: Fall  WEIGHT BEARING RESTRICTIONS No  FALLS: Has patient fallen in last 6 months? Yes. Number of falls maybe 4  LIVING ENVIRONMENT: Lives with: lives with their family Lives in: House/apartment Stairs: No Has following equipment at home: Single point cane, Environmental consultant - 2 wheeled, Wheelchair (manual), Tour manager, and Grab bars  PLOF: Independent with basic ADLs, Independent with household mobility with device, and Independent with community mobility with device  PATIENT GOALS able to walk better and safer  OBJECTIVE:   COGNITION: Overall cognitive status: Within functional limits for tasks assessed   SENSATION: WFL  COORDINATION: Decreased coordination with walking and balance   POSTURE: rounded shoulders, forward head, increased thoracic kyphosis, anterior pelvic tilt, and flexed trunk   LOWER EXTREMITY ROM:   WFL limited due to weakness and coordination, but she is able to walk with modified IND  LOWER EXTREMITY MMT:    MMT Right Eval Left Eval  Hip flexion 3 3  Hip extension    Hip abduction    Hip adduction    Hip internal rotation    Hip external rotation    Knee flexion 2+ 2 w/pain  Knee extension 2- w/pain 2 w/pain  Ankle dorsiflexion 3+ 3+  Ankle plantarflexion 4 4  Ankle inversion    Ankle eversion    (Blank rows = not tested)   TRANSFERS: Assistive device utilized: Single point cane  Sit to stand: Mod A Stand to sit: Mod A Chair to chair: Mod A  GAIT: Gait pattern: decreased step length- Right, decreased step length- Left, decreased stance time- Right, decreased stance time- Left, decreased stride length, shuffling, ataxic, trendelenburg, decreased trunk rotation, narrow BOS, poor foot clearance- Right, and  poor foot clearance- Left Distance walked: in clinic distances Assistive device utilized: Single point cane Level of assistance: CGA and Min A Comments: walks with SPC, needs CGA and minA for longer distances due to unsafe gait pattern and high fall risk  FUNCTIONAL TESTs:  5 times sit to stand: attempted but unable to complete Timed up and go (TUG): 33.65 w/SPC and modA to stand [employer] Scale: 15/56 Functional gait assessment: TBD  PATIENT SURVEYS:  LEFS 22  TODAY'S TREATMENT:  POC   PATIENT EDUCATION: Education details: POC Person educated: Patient and Child(ren) Education method: Explanation Education comprehension: verbalized understanding   HOME EXERCISE PROGRAM: TBD  GOALS: Goals reviewed with patient? No  SHORT TERM GOALS: Target date: 09/02/21  Patient will be independent with initial HEP. Goal status: INITIAL  2.  Patient will be educated on strategies to decrease risk of falls.  Goal status: INITIAL   LONG TERM GOALS: Target date: 10/07/21   1.  Patient will be able to ambulate 600' with LRAD with good safety to access community.  Baseline: using SPC, decreased safety awareness and increased fall risk Goal status: INITIAL  2.  Patient will demonstrate improved functional LE strength as demonstrated by an increase by 1 in all muscle  groups. Goal status: INITIAL  3.   Patient will score 25 on Berg Balance test to demonstrate lower risk of falls. (MCID= 8 points) .  Baseline: 15 Goal status: INITIAL  4. Patient will demonstrate decreased fall risk by scoring < 25 sec on TUG. Baseline: 33.65 Goal status: INITIAL      ASSESSMENT:  CLINICAL IMPRESSION: Patient is a 76 y.o. female who was seen today for physical therapy evaluation and treatment for gait and balance problems. She has been having difficulties for a few years being in and out of the hospital and having frequent falls. She has difficulty with sit to stand transfers and requires modA  to prevent from falling over. We went over some safety measures today and re-evaluated strength and ROM. Patient will benefit from skilled PT intervention to address LE strength, balance, and gait abnormalities to decrease fall risk.     OBJECTIVE IMPAIRMENTS Abnormal gait, decreased balance, decreased endurance, decreased mobility, difficulty walking, decreased strength, decreased safety awareness, improper body mechanics, postural dysfunction, and pain.   ACTIVITY LIMITATIONS carrying, lifting, bending, sitting, standing, squatting, stairs, transfers, and locomotion level  PARTICIPATION LIMITATIONS: cleaning, laundry, shopping, and community activity  PERSONAL FACTORS Age, Behavior pattern, Past/current experiences, and 3+ comorbidities: OA, cardiovascular disease, frequent falls, CHF, SLE  are also affecting patient's functional outcome.   REHAB POTENTIAL: Fair    CLINICAL DECISION MAKING: Evolving/moderate complexity  EVALUATION COMPLEXITY: Moderate  PLAN: PT FREQUENCY: 2x/week  PT DURATION: 10 weeks  PLANNED INTERVENTIONS: Therapeutic exercises, Therapeutic activity, Neuromuscular re-education, Balance training, Gait training, Patient/Family education, Joint mobilization, Cryotherapy, Moist heat, Taping, Vasopneumatic device, Ionotophoresis 4mg /ml Dexamethasone, Manual therapy, and Re-evaluation  PLAN FOR NEXT SESSION: initiate HEP, work on transfers,    [employer], PT 07/22/2021, 6:05 PM

## 2021-07-23 NOTE — Progress Notes (Signed)
NEUROLOGY FOLLOW UP OFFICE NOTE  Lindsey Huber [id number]  Assessment/Plan:    Transient neurologic symptoms - No lateralizing signs and given that presentation is similar to prior episode in June, consider complex partial seizure Neurocognitive disorder with behavioral changes - suspect underling dementia (given hallucinations and falls, consider Lewy Body dementia, although rather advanced age for onset). Hypertension   To treat hallucinations and agitation, will start olanzapine 2.5mg  at bedtime.  She has a prolonged QTc interval (516 in February), so I want to avoid other atypical antipsychotics.  Olanzapine apparently has less effect on the QTc, although we discussed black box warning.   Neuropsychological evaluation scheduled for next month. Follow up with PCP regarding blood pressure Further recommendations pending results.     Subjective:  Lindsey Huber is a 76 year old right-handed female with a fib, cardiomyopathy, HTN, CHF, lupus, pulmonary fibrosis and CKD who follows up for TIA and neurocognitive disorder.  She is accompanied by her daughter who supplements history.   UPDATE: 24 hour ambulatory EEG on 4/5-04/30/2021 was normal.  No events captured.  Unable to get MRI of brain because she has reported abandoned leads.  CT head on 06/18/2021 and  07/14/2021 personally reviewed showed moderate chronic small vessel ischemic changes in the cerebral white matter but no acute findings.  Scheduled for neuropsychological evaluation next month.    She reports headaches.  They are left temporal and sometimes will travel to right temple pressure pain, sometimes mild and sometimes severe.  She has a slight headache every morning.  She takes Tylenol and it dissipates.  She is not sleeping.  She continues to hallucinate.             HISTORY:  On 07/01/2020, the patient experienced a mild headache off and on during the day.  She also had a left temporal headache and noted intermittent  numbness and tingling in the bilateral lower extremities.  That evening, she was talking with family when she suddenly started holding the right side of her face, eyes became big and she started drooling.  When her family spoke to her, she could only moan.  She was unable to repeat.  She could not smile but did not exhibit facial droop or unilateral weakness.  After 5-6 minutes, she started speaking but it was slurred and didn't make sense.  She was unable to repeat phrases.  She has no recollection of this.  No convulsions or incontinence  She was brought to the ED.  CTA of head and neck personally reviewed was negative for large vessel occlusion or hemodynamically significant stenosis.  Unable to have an MRI due to pacemaker.  She has a fib which is treated with Xarelto.  Routine awake and asleep EEG on 07/14/2020 was normal.  On the morning of 03/14/2021, she was noted by her daughter to be acting strange.  When she spoke, she had trouble putting words together and did not make sense.  After about an hour, she became unresponsive, started staring off, making moaning sounds and exhibited head twitching and shaking of both hands.  Lasted 2-3 minutes.  No incontinence or tongue biting.  She continued having some slurred speech and difficulty getting words out afterwards.  In hindsight, she says that she does remember feeling strange during this event and struggling to talk.  No lateralizing symptoms such as facial droop or unilateral limb weakness.  EMS was called and symptoms resolved en route to the ED.  2 days prior, she had a  fall and landed on her left arm.  CT head personally reviewed revealed no acute intracranial abnormality.  MRI was unable to be performed because there were no [employer] reps that day who could come to the hospital to turn off the pacemaker.  She was found to have cellulitis on her left medial forearm where she had fallen.  Labs revealed no leukocytes and electrolytes normal.  She was  discharged on Keflex.    Beginning in 2022, her daughter has been concerned about dementia.  She has been having both visual and auditory hallucinations.  For example, she saw a large spider web in her house that she said was put up by somebody.   She sometimes hears people outside the window.  She also may exhibit paranoid delusions such as thinking her cousin is calling into radio stations and talking about her, or that her conversations are being broadcast throughout the apartment complex.  This isn't a frequent occurrence.  She lives with her granddaughter and her daughter stops by everyday.  She is able to perform ADLs independently.  No family history of dementia.  B12 from November 2022 was 1,406.  TSH from January 2023 was 1.470   She has remote history of left sided temporal headaches (sometimes radiates to right side).  Sed rate in June 2022 was 58 and CRP was 1.2.    PAST MEDICAL HISTORY: Past Medical History:  Diagnosis Date   A-fib (HCC)    Anemia    Cardiomyopathy (HCC)    CHF (congestive heart failure) (HCC)    CKD (chronic kidney disease)    Diverticulosis    Hypertension    Lupus (HCC)    Osteoarthritis    Pulmonary fibrosis (HCC)    Vitamin D deficiency     MEDICATIONS: Current Outpatient Medications on File Prior to Visit  Medication Sig Dispense Refill   acetaminophen (TYLENOL) 500 MG tablet Take 1,000 mg by mouth every 6 (six) hours as needed (pain).     albuterol (VENTOLIN HFA) 108 (90 Base) MCG/ACT inhaler Inhale 2 puffs into the lungs every 6 (six) hours as needed for wheezing or shortness of breath.     allopurinol (ZYLOPRIM) 100 MG tablet TAKE 1 TABLET BY MOUTH EVERY DAY 90 tablet 0   Budeson-Glycopyrrol-Formoterol (BREZTRI AEROSPHERE) 160-9-4.8 MCG/ACT AERO Inhale 2 puffs into the lungs 2 (two) times daily as needed (shortness of breath).     Calcium Carbonate-Vitamin D (CALTRATE 600+D PO) Take 1 capsule by mouth daily with lunch.     cetirizine HCl (ZYRTEC)  5 MG/5ML SOLN Take 5 mg by mouth at bedtime.     Cholecalciferol 25 MCG (1000 UT) tablet Take 1,000 Units by mouth daily with lunch.     famotidine (PEPCID) 20 MG tablet Take 20 mg by mouth at bedtime as needed.     ferrous sulfate 325 (65 FE) MG tablet Take 325 mg by mouth 2 (two) times daily with a meal.     fluticasone (FLONASE) 50 MCG/ACT nasal spray Place 2 sprays into both nostrils daily as needed for allergies.     folic acid (FOLVITE) 800 MCG tablet Take 800 mcg by mouth daily with lunch.     furosemide (LASIX) 20 MG tablet Take 20 mg by mouth See admin instructions. Take one tablet (20 mg) by mouth every morning, Hold if BP is lower than 110/60     hydroxychloroquine (PLAQUENIL) 200 MG tablet Take 1 tablet (200 mg total) by mouth daily. 90 tablet 1  levothyroxine (SYNTHROID) 25 MCG tablet Take 1 tablet (25 mcg total) by mouth daily. 90 tablet 3   metoprolol succinate (TOPROL-XL) 25 MG 24 hr tablet Take 1 tablet (25 mg total) by mouth daily. Hold dose if <110 SBP or <60 DBP 90 tablet 3   Multiple Vitamins-Minerals (MULTIVITAMIN WITH MINERALS) tablet Take 1 tablet by mouth daily with lunch.     Nintedanib (OFEV) 150 MG CAPS Take 150 mg by mouth at bedtime.     predniSONE (DELTASONE) 5 MG tablet Take 1 tablet (5 mg total) by mouth daily with breakfast. Patient to double up dose during sick day rule 100 tablet 3   Rivaroxaban (XARELTO) 15 MG TABS tablet Take 1 tablet (15 mg total) by mouth daily with supper. 90 tablet 3   vitamin B-12 (CYANOCOBALAMIN) 1000 MCG tablet Take 1,000 mcg by mouth daily with lunch.     No current facility-administered medications on file prior to visit.    ALLERGIES: Allergies  Allergen Reactions   Propofol Other (See Comments)    Caused asthma Other reaction(s): asthma   Flecainide Other (See Comments)    Unknown reaction Other reaction(s): unknown   Amiodarone Other (See Comments)    Delerium/Confusion/Psychosis,  Other reaction(s): unknown    Amlodipine Other (See Comments)    Tired, syncope Other reaction(s): unknown    FAMILY HISTORY: Family History  Problem Relation Age of Onset   Heart disease Mother    Hypertension Mother    Rheum arthritis Mother    Osteoarthritis Mother    Heart disease Father    Hypertension Father    Osteoarthritis Father    Heart disease Sister    Diabetes Brother    Cancer Brother       Objective:  Blood pressure (!) 160/90, pulse 69, height 5\' 3"  (1.6 m), weight 120 lb (54.4 kg), SpO2 97 %. General: No acute distress.  Patient appears well-groomed.   Head:  Normocephalic/atraumatic Eyes:  Fundi examined but not visualized Neck: supple, no paraspinal tenderness, full range of motion Heart:  Regular rate and rhythm Lungs:  Clear to auscultation bilaterally Back: No paraspinal tenderness Neurological Exam:     07/24/2021    3:00 PM  [employer] Mental Exam  Weekday Correct 1  Current year 1  What state are we in? 1  Amount spent 1  Amount left 2  # of Animals 2  5 objects recall 4  Number series 0  Hour markers 0  Time correct 2  Placed X in triangle correctly 1  Largest Figure 1  Name of female 2  Date back to work 2  Type of work 2  State she lived in 0  Total score 22    07-09-1995, DO  CC: [place], [place]

## 2021-07-24 ENCOUNTER — Encounter: Payer: Self-pay | Admitting: Neurology

## 2021-07-24 ENCOUNTER — Ambulatory Visit (INDEPENDENT_AMBULATORY_CARE_PROVIDER_SITE_OTHER): Payer: Medicare HMO | Admitting: Neurology

## 2021-07-24 VITALS — BP 160/90 | HR 69 | Ht 63.0 in | Wt 120.0 lb

## 2021-07-24 DIAGNOSIS — I10 Essential (primary) hypertension: Secondary | ICD-10-CM

## 2021-07-24 DIAGNOSIS — R419 Unspecified symptoms and signs involving cognitive functions and awareness: Secondary | ICD-10-CM | POA: Diagnosis not present

## 2021-07-24 MED ORDER — OLANZAPINE 2.5 MG PO TABS: 2.5000 mg | ORAL_TABLET | Freq: Every day | ORAL | 5 refills | Status: DC

## 2021-07-24 NOTE — Patient Instructions (Addendum)
To help with agitation and hallucinations, we will start olanzapine 2.5mg  at bedtime.  We can increase dose if needed Follow up with your appointment with Dr. Milbert Coulter next month Further recommendations pending results. Otherwise follow up 4 months.

## 2021-07-25 ENCOUNTER — Emergency Department (HOSPITAL_BASED_OUTPATIENT_CLINIC_OR_DEPARTMENT_OTHER): Payer: Medicare HMO

## 2021-07-25 ENCOUNTER — Observation Stay (HOSPITAL_BASED_OUTPATIENT_CLINIC_OR_DEPARTMENT_OTHER)
Admission: EM | Admit: 2021-07-25 | Discharge: 2021-07-27 | Disposition: A | Discharge status: Home / Self Care | Payer: Medicare HMO | Attending: Internal Medicine | Admitting: Internal Medicine

## 2021-07-25 ENCOUNTER — Encounter (HOSPITAL_BASED_OUTPATIENT_CLINIC_OR_DEPARTMENT_OTHER): Payer: Self-pay

## 2021-07-25 ENCOUNTER — Other Ambulatory Visit: Payer: Self-pay

## 2021-07-25 DIAGNOSIS — G9341 Metabolic encephalopathy: Secondary | ICD-10-CM

## 2021-07-25 DIAGNOSIS — Z7901 Long term (current) use of anticoagulants: Secondary | ICD-10-CM | POA: Insufficient documentation

## 2021-07-25 DIAGNOSIS — D638 Anemia in other chronic diseases classified elsewhere: Secondary | ICD-10-CM

## 2021-07-25 DIAGNOSIS — I130 Hypertensive heart and chronic kidney disease with heart failure and stage 1 through stage 4 chronic kidney disease, or unspecified chronic kidney disease: Secondary | ICD-10-CM | POA: Insufficient documentation

## 2021-07-25 DIAGNOSIS — E039 Hypothyroidism, unspecified: Secondary | ICD-10-CM | POA: Diagnosis not present

## 2021-07-25 DIAGNOSIS — J84112 Idiopathic pulmonary fibrosis: Secondary | ICD-10-CM | POA: Diagnosis present

## 2021-07-25 DIAGNOSIS — Z952 Presence of prosthetic heart valve: Secondary | ICD-10-CM | POA: Insufficient documentation

## 2021-07-25 DIAGNOSIS — N183 Chronic kidney disease, stage 3 unspecified: Secondary | ICD-10-CM | POA: Diagnosis present

## 2021-07-25 DIAGNOSIS — F22 Delusional disorders: Secondary | ICD-10-CM | POA: Diagnosis not present

## 2021-07-25 DIAGNOSIS — I5032 Chronic diastolic (congestive) heart failure: Secondary | ICD-10-CM | POA: Diagnosis not present

## 2021-07-25 DIAGNOSIS — Z95810 Presence of automatic (implantable) cardiac defibrillator: Secondary | ICD-10-CM | POA: Insufficient documentation

## 2021-07-25 DIAGNOSIS — N1832 Chronic kidney disease, stage 3b: Secondary | ICD-10-CM | POA: Diagnosis not present

## 2021-07-25 DIAGNOSIS — F0390 Unspecified dementia without behavioral disturbance: Secondary | ICD-10-CM | POA: Diagnosis not present

## 2021-07-25 DIAGNOSIS — I4821 Permanent atrial fibrillation: Secondary | ICD-10-CM | POA: Diagnosis not present

## 2021-07-25 DIAGNOSIS — R4182 Altered mental status, unspecified: Secondary | ICD-10-CM | POA: Diagnosis present

## 2021-07-25 DIAGNOSIS — Z79899 Other long term (current) drug therapy: Secondary | ICD-10-CM | POA: Diagnosis not present

## 2021-07-25 DIAGNOSIS — M329 Systemic lupus erythematosus, unspecified: Secondary | ICD-10-CM | POA: Diagnosis present

## 2021-07-25 HISTORY — DX: Unspecified dementia, unspecified severity, without behavioral disturbance, psychotic disturbance, mood disturbance, and anxiety: F03.90

## 2021-07-25 HISTORY — DX: Metabolic encephalopathy: G93.41

## 2021-07-25 LAB — CBC WITH DIFFERENTIAL/PLATELET
Abs Immature Granulocytes: 0.01 10*3/uL (ref 0.00–0.07)
Basophils Absolute: 0 10*3/uL (ref 0.0–0.1)
Basophils Relative: 0 %
Eosinophils Absolute: 0 10*3/uL (ref 0.0–0.5)
Eosinophils Relative: 1 %
HCT: 33.8 % — ABNORMAL LOW (ref 36.0–46.0)
Hemoglobin: 11.2 g/dL — ABNORMAL LOW (ref 12.0–15.0)
Immature Granulocytes: 0 %
Lymphocytes Relative: 21 %
Lymphs Abs: 1.1 10*3/uL (ref 0.7–4.0)
MCH: 33.2 pg (ref 26.0–34.0)
MCHC: 33.1 g/dL (ref 30.0–36.0)
MCV: 100.3 fL — ABNORMAL HIGH (ref 80.0–100.0)
Monocytes Absolute: 0.5 10*3/uL (ref 0.1–1.0)
Monocytes Relative: 10 %
Neutro Abs: 3.5 10*3/uL (ref 1.7–7.7)
Neutrophils Relative %: 68 %
Platelets: 153 10*3/uL (ref 150–400)
RBC: 3.37 MIL/uL — ABNORMAL LOW (ref 3.87–5.11)
RDW: 13.5 % (ref 11.5–15.5)
WBC: 5.2 10*3/uL (ref 4.0–10.5)
nRBC: 0 % (ref 0.0–0.2)

## 2021-07-25 LAB — COMPREHENSIVE METABOLIC PANEL
ALT: 22 U/L (ref 0–44)
AST: 45 U/L — ABNORMAL HIGH (ref 15–41)
Albumin: 3.6 g/dL (ref 3.5–5.0)
Alkaline Phosphatase: 35 U/L — ABNORMAL LOW (ref 38–126)
Anion gap: 7 (ref 5–15)
BUN: 24 mg/dL — ABNORMAL HIGH (ref 8–23)
CO2: 27 mmol/L (ref 22–32)
Calcium: 9.5 mg/dL (ref 8.9–10.3)
Chloride: 103 mmol/L (ref 98–111)
Creatinine, Ser: 1.07 mg/dL — ABNORMAL HIGH (ref 0.44–1.00)
GFR, Estimated: 55 mL/min — ABNORMAL LOW (ref >60)
Glucose, Bld: 88 mg/dL (ref 70–99)
Potassium: 3.7 mmol/L (ref 3.5–5.1)
Sodium: 137 mmol/L (ref 135–145)
Total Bilirubin: 1 mg/dL (ref 0.3–1.2)
Total Protein: 8 g/dL (ref 6.5–8.1)

## 2021-07-25 LAB — URINALYSIS, ROUTINE W REFLEX MICROSCOPIC
Bilirubin Urine: NEGATIVE
Glucose, UA: NEGATIVE mg/dL
Hgb urine dipstick: NEGATIVE
Ketones, ur: NEGATIVE mg/dL
Nitrite: NEGATIVE
Protein, ur: 100 mg/dL — AB
Specific Gravity, Urine: >=1.03 (ref 1.005–1.030)
pH: 6 (ref 5.0–8.0)

## 2021-07-25 LAB — URINALYSIS, MICROSCOPIC (REFLEX)

## 2021-07-25 LAB — TROPONIN I (HIGH SENSITIVITY)
Troponin I (High Sensitivity): 91 ng/L — ABNORMAL HIGH (ref <18)
Troponin I (High Sensitivity): 98 ng/L — ABNORMAL HIGH (ref <18)

## 2021-07-25 LAB — BRAIN NATRIURETIC PEPTIDE: B Natriuretic Peptide: 479 pg/mL — ABNORMAL HIGH (ref 0.0–100.0)

## 2021-07-25 LAB — TSH: TSH: 2.075 u[IU]/mL (ref 0.350–4.500)

## 2021-07-25 MED ORDER — FAMOTIDINE 20 MG PO TABS: 20.0000 mg | ORAL_TABLET | Freq: Every evening | ORAL | Status: DC | PRN

## 2021-07-25 MED ORDER — BUDESON-GLYCOPYRROL-FORMOTEROL 160-9-4.8 MCG/ACT IN AERO: 2.0000 | INHALATION_SPRAY | Freq: Two times a day (BID) | RESPIRATORY_TRACT | Status: DC | PRN

## 2021-07-25 MED ORDER — POLYETHYLENE GLYCOL 3350 17 G PO PACK
17.0000 g | PACK | Freq: Two times a day (BID) | ORAL | Status: AC
  Administered 2021-07-26: 17 g via ORAL
  Filled 2021-07-25 (×2): qty 1

## 2021-07-25 MED ORDER — FOLIC ACID 1 MG PO TABS
1.0000 mg | ORAL_TABLET | Freq: Every day | ORAL | Status: DC
Start: 2021-07-26 — End: 2021-07-27
  Administered 2021-07-26 – 2021-07-27 (×2): 1 mg via ORAL
  Filled 2021-07-25 (×2): qty 1

## 2021-07-25 MED ORDER — ONDANSETRON HCL 4 MG PO TABS: 4.0000 mg | ORAL_TABLET | Freq: Four times a day (QID) | ORAL | Status: DC | PRN

## 2021-07-25 MED ORDER — METOPROLOL SUCCINATE ER 25 MG PO TB24: 25.0000 mg | ORAL_TABLET | Freq: Every day | ORAL | Status: DC

## 2021-07-25 MED ORDER — RIVAROXABAN 15 MG PO TABS
15.0000 mg | ORAL_TABLET | Freq: Every day | ORAL | Status: DC
  Administered 2021-07-25 – 2021-07-26 (×2): 15 mg via ORAL
  Filled 2021-07-25 (×2): qty 1

## 2021-07-25 MED ORDER — VITAMIN B-12 1000 MCG PO TABS
1000.0000 ug | ORAL_TABLET | Freq: Every day | ORAL | Status: DC
  Administered 2021-07-26 – 2021-07-27 (×2): 1000 ug via ORAL
  Filled 2021-07-25 (×2): qty 1

## 2021-07-25 MED ORDER — METOPROLOL SUCCINATE ER 25 MG PO TB24
25.0000 mg | ORAL_TABLET | Freq: Every day | ORAL | Status: DC
  Administered 2021-07-25 – 2021-07-27 (×3): 25 mg via ORAL
  Filled 2021-07-25 (×3): qty 1

## 2021-07-25 MED ORDER — ALBUTEROL SULFATE HFA 108 (90 BASE) MCG/ACT IN AERS: 2.0000 | INHALATION_SPRAY | RESPIRATORY_TRACT | Status: DC | PRN

## 2021-07-25 MED ORDER — OLANZAPINE 2.5 MG PO TABS
2.5000 mg | ORAL_TABLET | Freq: Every day | ORAL | Status: DC
Start: 2021-07-25 — End: 2021-07-25

## 2021-07-25 MED ORDER — NINTEDANIB ESYLATE 150 MG PO CAPS
150.0000 mg | ORAL_CAPSULE | Freq: Every day | ORAL | Status: DC
Start: 2021-07-25 — End: 2021-07-25

## 2021-07-25 MED ORDER — FUROSEMIDE 20 MG PO TABS: 20.0000 mg | ORAL_TABLET | Freq: Every day | ORAL | Status: DC

## 2021-07-25 MED ORDER — FUROSEMIDE 40 MG PO TABS
40.0000 mg | ORAL_TABLET | Freq: Every day | ORAL | Status: DC
  Administered 2021-07-27: 40 mg via ORAL
  Filled 2021-07-25: qty 1

## 2021-07-25 MED ORDER — ENSURE ENLIVE PO LIQD
237.0000 mL | Freq: Two times a day (BID) | ORAL | Status: DC
  Administered 2021-07-26 – 2021-07-27 (×3): 237 mL via ORAL

## 2021-07-25 MED ORDER — ONDANSETRON HCL 4 MG/2ML IJ SOLN: 4.0000 mg | Freq: Four times a day (QID) | INTRAMUSCULAR | Status: DC | PRN

## 2021-07-25 MED ORDER — SODIUM CHLORIDE 0.9 % IV SOLN: 250.0000 mL | INTRAVENOUS | Status: DC | PRN

## 2021-07-25 MED ORDER — HYDROXYCHLOROQUINE SULFATE 200 MG PO TABS: 200.0000 mg | ORAL_TABLET | Freq: Every day | ORAL | Status: DC

## 2021-07-25 MED ORDER — ACETAMINOPHEN 650 MG RE SUPP: 650.0000 mg | Freq: Four times a day (QID) | RECTAL | Status: DC | PRN

## 2021-07-25 MED ORDER — FUROSEMIDE 20 MG PO TABS: 20.0000 mg | ORAL_TABLET | ORAL | Status: DC

## 2021-07-25 MED ORDER — LEVOTHYROXINE SODIUM 25 MCG PO TABS
25.0000 ug | ORAL_TABLET | Freq: Every day | ORAL | Status: DC
Start: 2021-07-26 — End: 2021-07-27
  Administered 2021-07-26 – 2021-07-27 (×2): 25 ug via ORAL
  Filled 2021-07-25 (×2): qty 1

## 2021-07-25 MED ORDER — LEVOTHYROXINE SODIUM 25 MCG PO TABS: 25.0000 ug | ORAL_TABLET | Freq: Every day | ORAL | Status: DC

## 2021-07-25 MED ORDER — ALBUTEROL SULFATE HFA 108 (90 BASE) MCG/ACT IN AERS: 2.0000 | INHALATION_SPRAY | Freq: Four times a day (QID) | RESPIRATORY_TRACT | Status: DC | PRN

## 2021-07-25 MED ORDER — PREDNISONE 5 MG PO TABS: 5.0000 mg | ORAL_TABLET | Freq: Every day | ORAL | Status: DC

## 2021-07-25 MED ORDER — FERROUS SULFATE 325 (65 FE) MG PO TABS
325.0000 mg | ORAL_TABLET | Freq: Two times a day (BID) | ORAL | Status: DC
  Administered 2021-07-26 – 2021-07-27 (×3): 325 mg via ORAL
  Filled 2021-07-25 (×3): qty 1

## 2021-07-25 MED ORDER — HALOPERIDOL LACTATE 5 MG/ML IJ SOLN: 2.0000 mg | Freq: Four times a day (QID) | INTRAMUSCULAR | Status: DC | PRN

## 2021-07-25 MED ORDER — FUROSEMIDE 40 MG PO TABS
40.0000 mg | ORAL_TABLET | Freq: Every evening | ORAL | Status: DC | PRN
Start: 2021-07-25 — End: 2021-07-27

## 2021-07-25 MED ORDER — SODIUM CHLORIDE 0.9% FLUSH: 3.0000 mL | INTRAVENOUS | Status: DC | PRN

## 2021-07-25 MED ORDER — OLANZAPINE 2.5 MG PO TABS
2.5000 mg | ORAL_TABLET | Freq: Every day | ORAL | Status: DC
  Administered 2021-07-26 (×2): 2.5 mg via ORAL
  Filled 2021-07-25 (×2): qty 1

## 2021-07-25 MED ORDER — ALLOPURINOL 100 MG PO TABS
100.0000 mg | ORAL_TABLET | Freq: Every day | ORAL | Status: DC
  Administered 2021-07-25 – 2021-07-27 (×3): 100 mg via ORAL
  Filled 2021-07-25 (×3): qty 1

## 2021-07-25 MED ORDER — ALBUTEROL SULFATE (2.5 MG/3ML) 0.083% IN NEBU: 2.5000 mg | INHALATION_SOLUTION | RESPIRATORY_TRACT | Status: DC | PRN

## 2021-07-25 MED ORDER — SODIUM CHLORIDE 0.9% FLUSH
3.0000 mL | Freq: Two times a day (BID) | INTRAVENOUS | Status: DC
  Administered 2021-07-25 – 2021-07-27 (×4): 3 mL via INTRAVENOUS

## 2021-07-25 MED ORDER — ACETAMINOPHEN 500 MG PO TABS
1000.0000 mg | ORAL_TABLET | Freq: Four times a day (QID) | ORAL | Status: DC | PRN
Start: 2021-07-25 — End: 2021-07-27
  Administered 2021-07-26 (×2): 1000 mg via ORAL
  Filled 2021-07-25 (×2): qty 2

## 2021-07-25 MED ORDER — ACETAMINOPHEN 325 MG PO TABS: 650.0000 mg | ORAL_TABLET | Freq: Four times a day (QID) | ORAL | Status: DC | PRN

## 2021-07-25 NOTE — ED Notes (Signed)
Patient transported to CT 

## 2021-07-25 NOTE — H&P (Signed)
History and Physical  Lindsey Huber LYY:[id number] DOB: 21-Nov-1945 DOA: 07/25/2021  PCP: Collene Mares, PA Patient coming from: [facility]  I have personally briefly reviewed patient's old medical records in [facility]   Chief Complaint: Acute metabolic encephalopathy  HPI: Lindsey Huber is a 76 y.o. female past medical history of chronic diastolic heart failure with a last 2D echo of 55%, moderately reduced RV function mild mitral regurgitation and severe bilateral enlargement, permanent atrial fibrillation on Xarelto lupus, pulmonary fibrosis not on oxygen on an attended followed by 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  They relate she has not slept for over 4 days and is becoming paranoid and seeing and hearing things.  In the ED: She has remained afebrile satting greater than 98% on room air, creatinine at baseline of 1.0 BNP of 400 cardiac biomarkers have remained basically flat x2 macrocytic anemia UA showed no signs of infection but showed an elevated specific gravity of 1030 CT scan of the head shows acute intracranial abnormalities no significant cerebral atrophy chest x-ray  showed pulmonary fibrosis   Review of Systems: All systems reviewed and apart from history of presenting illness, are negative.  Past Medical History:  Diagnosis Date   A-fib (HCC)    Anemia    Cardiomyopathy (HCC)    CHF (congestive heart failure) (HCC)    CKD (chronic kidney disease)    Dementia (HCC)    Diverticulosis    Hypertension    Lupus (HCC)    Osteoarthritis    Pulmonary fibrosis (HCC)    Vitamin D deficiency    Past Surgical History:  Procedure Laterality Date   BREAST BIOPSY Left    CARDIAC VALVE SURGERY     CARPAL TUNNEL RELEASE     COLONOSCOPY WITH PROPOFOL N/A 12/20/2020   Procedure: COLONOSCOPY WITH PROPOFOL;  Surgeon: Willis Modena, MD;  Location: [facility] ENDOSCOPY;  Service: Endoscopy;  Laterality: N/A;   HEMORRHOID SURGERY     PACEMAKER INSERTION     RIGHT/LEFT HEART CATH AND CORONARY ANGIOGRAPHY N/A 11/27/2020   Procedure: RIGHT/LEFT HEART CATH AND CORONARY ANGIOGRAPHY;  Surgeon: Dolores Patty, MD;  Location: [facility] INVASIVE CV LAB;  Service: Cardiovascular;  Laterality: N/A;   TONSILLECTOMY     Social History:  reports that she has never smoked. She has been exposed to tobacco smoke. She has never used smokeless tobacco. She reports that she does not drink alcohol and does not use drugs.   Allergies  Allergen Reactions   Propofol Other (See Comments)    "Caused asthma"   Flecainide Other (See Comments)    Reaction not recalled   Amiodarone Other (See Comments)    Delirium/Confusion/Psychosis   Amlodipine Other (See Comments)    "  Tired and syncope"    Family History  Problem Relation Age of Onset   Heart disease Mother    Hypertension Mother    Rheum arthritis Mother    Osteoarthritis Mother    Heart disease Father    Hypertension Father    Osteoarthritis Father    Heart disease Sister    Diabetes Brother    Cancer Brother     Prior to Admission medications   Medication Sig Start Date End Date Taking? Authorizing Provider   acetaminophen (TYLENOL) 500 MG tablet Take 1,000 mg by mouth every 6 (six) hours as needed (pain).   Yes [provider]  albuterol (VENTOLIN HFA) 108 (90 Base) MCG/ACT inhaler Inhale 2 puffs into the lungs every 6 (six) hours as needed for wheezing or shortness of breath.   Yes [provider]  allopurinol (ZYLOPRIM) 100 MG tablet TAKE 1 TABLET BY MOUTH EVERY DAY Patient taking differently: Take 100 mg by mouth daily. 07/22/21  Yes Rice, Resa Miner, MD  famotidine (PEPCID) 20 MG tablet Take 20 mg by mouth at bedtime as needed. 07/03/21  Yes [provider]  ferrous sulfate 325 (65 FE) MG tablet Take 325 mg by mouth See admin instructions. Take 325 mg by mouth with breakfast and supper   Yes [provider]  folic acid (FOLVITE) [id number] MCG tablet Take 800 mcg by mouth daily with lunch.   Yes [provider]  metoprolol succinate (TOPROL-XL) 25 MG 24 hr tablet Take 1 tablet (25 mg total) by mouth daily. Hold dose if <110 SBP or <60 DBP 04/23/21  Yes Bensimhon, Shaune Pascal, MD  predniSONE (DELTASONE) 5 MG tablet Take 1 tablet (5 mg total) by mouth daily with breakfast. Patient to double up dose during sick day rule 02/20/21  Yes Shamleffer, Melanie Crazier, MD  Rivaroxaban (XARELTO) 15 MG TABS tablet Take 1 tablet (15 mg total) by mouth daily with supper. 04/23/21  Yes Bensimhon, Shaune Pascal, MD  vitamin B-12 (CYANOCOBALAMIN) 1000 MCG tablet Take 1,000 mcg by mouth daily with lunch.   Yes [provider]  Budeson-Glycopyrrol-Formoterol (BREZTRI AEROSPHERE) 160-9-4.8 MCG/ACT AERO Inhale 2 puffs into the lungs 2 (two) times daily as needed (shortness of breath).    [provider]  cetirizine HCl (ZYRTEC) 5 MG/5ML SOLN Take 5 mg by mouth at bedtime.    [provider]  Cholecalciferol 25 MCG (1000 UT) tablet Take 1,000 Units by mouth daily with lunch.    [provider]  fluticasone (FLONASE) 50 MCG/ACT nasal spray Place 2 sprays into  both nostrils daily as needed for allergies.    [provider]  furosemide (LASIX) 20 MG tablet Take 20 mg by mouth See admin instructions. Take one tablet (20 mg) by mouth every morning, Hold if BP is lower than 110/60    [provider]  hydroxychloroquine (PLAQUENIL) 200 MG tablet Take 1 tablet (200 mg total) by mouth daily. 04/10/21   Rice, Resa Miner, MD  levothyroxine (SYNTHROID) 25 MCG tablet Take 1 tablet (25 mcg total) by mouth daily. 02/25/21   Shamleffer, Melanie Crazier, MD  Multiple Vitamins-Minerals (MULTIVITAMIN WITH MINERALS) tablet Take 1 tablet by mouth daily with lunch.    [provider]  Nintedanib (OFEV) 150 MG CAPS Take 150 mg by mouth at bedtime.    [provider]  OLANZapine (ZYPREXA) 2.5 MG tablet Take 1 tablet (2.5 mg total) by mouth at bedtime. Patient not taking: Reported on 07/25/2021 07/24/21   Pieter Partridge,  DO   Physical Exam: Vitals:   07/25/21 1200 07/25/21 1215 07/25/21 1400 07/25/21 1558  BP: (!) 172/79  (!) 164/72 (!) 153/84  Pulse: 70 70 69 71  Resp: (!) 21 (!) 23 16 16   Temp:    98.9 F (37.2 C)  TempSrc:    Oral  SpO2: 100% 100% 100% 99%  Weight:      Height:        General exam: Moderately built and nourished patient, lying comfortably supine on the gurney in no obvious distress. Head, eyes and ENT: Nontraumatic and normocephalic. Pupils equally reacting to light and accommodation. Oral mucosa moist. Neck: Supple. No JVD, carotid bruit or thyromegaly. Lymphatics: No lymphadenopathy. Respiratory system: Good air movement with diffuse dry crackles bilaterally Cardiovascular system: S1 and S2 heard, RRR.  Cannot appreciate any JVD Gastrointestinal system: Abdomen is nondistended, soft and nontender. Normal bowel sounds heard. No organomegaly or masses appreciated. Central nervous system: Awake alert and oriented x2 no focal deficits Extremities: Symmetric 5 x 5 power. Peripheral pulses symmetrically felt.  Skin:  No rashes or acute findings. Musculoskeletal system: Negative exam. Psychiatry: She is pleasant and comparative creatinine   Labs on Admission:  Basic Metabolic Panel: Recent Labs  Lab 07/25/21 0101  NA 137  K 3.7  CL 103  CO2 27  GLUCOSE 88  BUN 24*  CREATININE 1.07*  CALCIUM 9.5   Liver Function Tests: Recent Labs  Lab 07/25/21 0101  AST 45*  ALT 22  ALKPHOS 35*  BILITOT 1.0  PROT 8.0  ALBUMIN 3.6   No results for input(s): "LIPASE", "AMYLASE" in the last 168 hours. No results for input(s): "AMMONIA" in the last 168 hours. CBC: Recent Labs  Lab 07/25/21 0101  WBC 5.2  NEUTROABS 3.5  HGB 11.2*  HCT 33.8*  MCV 100.3*  PLT 153   Cardiac Enzymes: No results for input(s): "CKTOTAL", "CKMB", "CKMBINDEX", "TROPONINI" in the last 168 hours.  BNP (last 3 results) No results for input(s): "PROBNP" in the last 8760 hours. CBG: No results for input(s): "GLUCAP" in the last 168 hours.  Radiological Exams on Admission: CT Head Wo Contrast  Result Date: 07/25/2021 CLINICAL DATA:  Altered mental status. EXAM: CT HEAD WITHOUT CONTRAST TECHNIQUE: Contiguous axial images were obtained from the base of the skull through the vertex without intravenous contrast. RADIATION DOSE REDUCTION: This exam was performed according to the departmental dose-optimization program which includes automated exposure control, adjustment of the mA and/or kV according to patient size and/or use of iterative reconstruction technique. COMPARISON:  July 14, 2021 FINDINGS: Brain: There is moderate severity cerebral atrophy with widening of the extra-axial spaces and ventricular dilatation. There are areas of decreased attenuation within the white matter tracts of the supratentorial brain, consistent with microvascular disease changes. There is bilateral, symmetric, basal ganglia calcification. Vascular: No hyperdense vessels are identified. Skull: Normal. Negative for fracture or focal lesion.  Sinuses/Orbits: No acute finding. Other: None. IMPRESSION: 1. No acute intracranial abnormality. 2. Generalized cerebral atrophy with chronic white matter small vessel ischemic changes. Electronically Signed   By: July 16, 2021 M.D.   On: 07/25/2021 02:06   DG Chest 2 View  Result Date: 07/25/2021 CLINICAL DATA:  Shortness of breath. EXAM: CHEST - 2 VIEW COMPARISON:  06/18/2021 FINDINGS: Multilead left-sided pacemaker in place. Post median sternotomy with multiple prosthetic cardiac valves. Cardiomegaly is stable. Increasing vascular congestion. No significant pleural effusion. Basilar interstitial lung disease. Ill-defined bibasilar opacities, favoring atelectasis. No pneumothorax. The bones are under mineralized  IMPRESSION: 1. Increasing vascular congestion. Stable cardiomegaly. 2. Ill-defined bibasilar opacities, favoring atelectasis superimposed on interstitial lung disease. Electronically Signed   By: Keith Rake M.D.   On: 07/25/2021 01:43    EKG: Independently reviewed.  Ventricular paced rhythm  Assessment/Plan Acute metabolic encephalopathy in the setting of underlying dementia: Does not appear to be an infectious source. There does appear to be a trigger which she has not slept in 4 days. For specific gravity is elevated 1030 which points or dehydration. She has an elevated MCV we will check a B12 and a folate, she relates no neurological deficits her proprioception is intact on physical exam. Consult physical therapy and Occupational Therapy we will resume her olanzapine.  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[place]): Stable not on oxygen continue monitor closely.  Systemic lupus erythematosus (HCC) Resume Plaquenil and steroids.  Chronic heart failure with preserved ejection fraction (HCC) Resume metoprolol, hold Lasix. Liberalize fluid intake.  Low-sodium diet.  Chronic kidney disease (CKD), active medical management without dialysis, stage 3 (moderate) (HCC) Creatinine  at baseline.  Anemia of chronic disease Check an RBC folate and B12. Resume home medications hemoglobin appears to be stable compared to previous admission.  Acquired hypothyroidism Recent Synthroid check a TSH.  Paroxysmal atrial fibrillation: Resume metoprolol and Xarelto.   DVT Prophylaxis: Xarelto Code Status: Full  Family Communication: daughter and grandaughter  Disposition Plan: observation      I admit under observation.  Given the aforementioned, the predictability of an adverse outcome is felt to be significant. I expect that the patient will require at least 2 midnights in the hospital to treat this condition.  Charlynne Cousins MD [facility]   07/25/2021, 5:02 PM

## 2021-07-25 NOTE — ED Notes (Signed)
Patient granddaughter at bedside  Brought  patient bojangles food which patient ate

## 2021-07-25 NOTE — ED Notes (Signed)
Pt placed on bedpan

## 2021-07-25 NOTE — ED Provider Notes (Addendum)
[facility] Provider Note: Lindsey Dell, MD, FACEP  CSN: [id number] MRN: [id number] ARRIVAL: 07/25/21 at 0027 ROOM: MH12/MH12   CHIEF COMPLAINT  Altered Mental Status  Level 5 caveat: Dementia HISTORY OF PRESENT ILLNESS  07/25/21 12:59 AM Lindsey Huber is a 76 y.o. female with a history of dementia.  Over the past 4 days she has gotten more confused than usual.  She has not slept in 4 days.  She has had decreased oral intake.  She has been refusing to take her medications.  She wandered upstairs prior to arrival which made her get short of breath.  She has baseline shortness of breath due to CHF and pulmonary fibrosis and does not normally go upstairs.  The patient denies any pain to me.  She has not had a fever to her family's knowledge.  She has chronic edema of the lower extremities, left greater than right.   Past Medical History:  Diagnosis Date   A-fib (HCC)    Anemia    Cardiomyopathy (HCC)    CHF (congestive heart failure) (HCC)    CKD (chronic kidney disease)    Dementia (HCC)    Diverticulosis    Hypertension    Lupus (HCC)    Osteoarthritis    Pulmonary fibrosis (HCC)    Vitamin D deficiency     Past Surgical History:  Procedure Laterality Date   BREAST BIOPSY Left    CARDIAC VALVE SURGERY     CARPAL TUNNEL RELEASE     COLONOSCOPY WITH PROPOFOL N/A 12/20/2020   Procedure: COLONOSCOPY WITH PROPOFOL;  Surgeon: Willis Modena, MD;  Location: [facility] ENDOSCOPY;  Service: Endoscopy;  Laterality: N/A;   HEMORRHOID SURGERY     PACEMAKER INSERTION     RIGHT/LEFT HEART CATH AND CORONARY ANGIOGRAPHY N/A 11/27/2020   Procedure: RIGHT/LEFT HEART CATH AND CORONARY ANGIOGRAPHY;  Surgeon: Dolores Patty, MD;  Location: [facility] INVASIVE CV LAB;  Service: Cardiovascular;  Laterality: N/A;   TONSILLECTOMY      Family History  Problem Relation Age of Onset   Heart disease Mother    Hypertension Mother    Rheum arthritis Mother    Osteoarthritis Mother     Heart disease Father    Hypertension Father    Osteoarthritis Father    Heart disease Sister    Diabetes Brother    Cancer Brother     Social History   Tobacco Use   Smoking status: Never    Passive exposure: Past   Smokeless tobacco: Never  Vaping Use   Vaping Use: Never used  Substance Use Topics   Alcohol use: Never   Drug use: Never    Prior to Admission medications   Medication Sig Start Date End Date Taking? Authorizing Provider  acetaminophen (TYLENOL) 500 MG tablet Take 1,000 mg by mouth every 6 (six) hours as needed (pain).    [provider]  albuterol (VENTOLIN HFA) 108 (90 Base) MCG/ACT inhaler Inhale 2 puffs into the lungs every 6 (six) hours as needed for wheezing or shortness of breath.    [provider]  allopurinol (ZYLOPRIM) 100 MG tablet TAKE 1 TABLET BY MOUTH EVERY DAY 07/22/21   Rice, Jamesetta Orleans, MD  Budeson-Glycopyrrol-Formoterol (BREZTRI AEROSPHERE) 160-9-4.8 MCG/ACT AERO Inhale 2 puffs into the lungs 2 (two) times daily as needed (shortness of breath).    [provider]  Calcium Carbonate-Vitamin D (CALTRATE 600+D PO) Take 1 capsule by mouth daily with lunch.    [provider]  cetirizine  HCl (ZYRTEC) 5 MG/5ML SOLN Take 5 mg by mouth at bedtime.    [provider]  Cholecalciferol 25 MCG (1000 UT) tablet Take 1,000 Units by mouth daily with lunch.    [provider]  famotidine (PEPCID) 20 MG tablet Take 20 mg by mouth at bedtime as needed. 07/03/21   [provider]  ferrous sulfate 325 (65 FE) MG tablet Take 325 mg by mouth 2 (two) times daily with a meal.    [provider]  fluticasone (FLONASE) 50 MCG/ACT nasal spray Place 2 sprays into both nostrils daily as needed for allergies.    [provider]  folic acid (FOLVITE) 800 MCG tablet Take 800 mcg by mouth daily with lunch.    [provider]  furosemide (LASIX) 20 MG tablet Take 20 mg by mouth See admin  instructions. Take one tablet (20 mg) by mouth every morning, Hold if BP is lower than 110/60    [provider]  hydroxychloroquine (PLAQUENIL) 200 MG tablet Take 1 tablet (200 mg total) by mouth daily. 04/10/21   Rice, Jamesetta Orleans, MD  levothyroxine (SYNTHROID) 25 MCG tablet Take 1 tablet (25 mcg total) by mouth daily. 02/25/21   Shamleffer, Konrad Dolores, MD  metoprolol succinate (TOPROL-XL) 25 MG 24 hr tablet Take 1 tablet (25 mg total) by mouth daily. Hold dose if <110 SBP or <60 DBP 04/23/21   Bensimhon, Bevelyn Buckles, MD  Multiple Vitamins-Minerals (MULTIVITAMIN WITH MINERALS) tablet Take 1 tablet by mouth daily with lunch.    [provider]  Nintedanib (OFEV) 150 MG CAPS Take 150 mg by mouth at bedtime.    [provider]  OLANZapine (ZYPREXA) 2.5 MG tablet Take 1 tablet (2.5 mg total) by mouth at bedtime. 07/24/21   Drema Dallas, DO  predniSONE (DELTASONE) 5 MG tablet Take 1 tablet (5 mg total) by mouth daily with breakfast. Patient to double up dose during sick day rule 02/20/21   Shamleffer, Konrad Dolores, MD  Rivaroxaban (XARELTO) 15 MG TABS tablet Take 1 tablet (15 mg total) by mouth daily with supper. 04/23/21   Bensimhon, Bevelyn Buckles, MD  vitamin B-12 (CYANOCOBALAMIN) 1000 MCG tablet Take 1,000 mcg by mouth daily with lunch.    [provider]    Allergies Propofol, Flecainide, Amiodarone, and Amlodipine   REVIEW OF SYSTEMS  Negative except as noted here or in the History of Present Illness.   PHYSICAL EXAMINATION  Initial Vital Signs Blood pressure (!) 159/94, pulse 74, temperature 98.9 F (37.2 C), temperature source Oral, resp. rate 17, height  (1.6 m), weight 54.4 kg, SpO2 98 %.  Examination General: Well-developed, thin female in no acute distress; appearance consistent with age of record HENT: normocephalic; atraumatic Eyes: pupils equal, round and reactive to light; extraocular muscles grossly intact Neck: supple Heart: regular  rate and rhythm; systolic and diastolic murmurs Lungs: clear to auscultation bilaterally Abdomen: soft; nondistended; nontender; bowel sounds present Extremities: No deformity; full range of motion; edema of lower legs, left greater than right Neurologic: Awake, alert; motor function intact in all extremities and symmetric; no facial droop Skin: Warm and dry Psychiatric: Normal mood and affect   RESULTS  Summary of this visit's results, reviewed and interpreted by myself:   EKG Interpretation  Date/Time:  Saturday July 25 2021 00:53:38 EDT Ventricular Rate:  71 PR Interval:  294 QRS Duration: 152 QT Interval:  491 QTC Calculation: 534 R Axis:   -86 Text Interpretation: VENTRICULAR PACING No significant change  was found Confirmed by Paula Libra ([id number]) on 07/25/2021 12:59:30 AM       Laboratory Studies: Results for orders placed or performed during the hospital encounter of 07/25/21 (from the past 24 hour(s))  CBC with Differential     Status: Abnormal   Collection Time: 07/25/21  1:01 AM  Result Value Ref Range   WBC 5.2 4.0 - 10.5 K/uL   RBC 3.37 (L) 3.87 - 5.11 MIL/uL   Hemoglobin 11.2 (L) 12.0 - 15.0 g/dL   HCT 58.8 (L) 50.2 - 77.4 %   MCV 100.3 (H) 80.0 - 100.0 fL   MCH 33.2 26.0 - 34.0 pg   MCHC 33.1 30.0 - 36.0 g/dL   RDW 12.8 78.6 - 76.7 %   Platelets 153 150 - 400 K/uL   nRBC 0.0 0.0 - 0.2 %   Neutrophils Relative % 68 %   Neutro Abs 3.5 1.7 - 7.7 K/uL   Lymphocytes Relative 21 %   Lymphs Abs 1.1 0.7 - 4.0 K/uL   Monocytes Relative 10 %   Monocytes Absolute 0.5 0.1 - 1.0 K/uL   Eosinophils Relative 1 %   Eosinophils Absolute 0.0 0.0 - 0.5 K/uL   Basophils Relative 0 %   Basophils Absolute 0.0 0.0 - 0.1 K/uL   Immature Granulocytes 0 %   Abs Immature Granulocytes 0.01 0.00 - 0.07 K/uL  Comprehensive metabolic panel     Status: Abnormal   Collection Time: 07/25/21  1:01 AM  Result Value Ref Range   Sodium 137 135 - 145 mmol/L   Potassium 3.7 3.5 - 5.1  mmol/L   Chloride 103 98 - 111 mmol/L   CO2 27 22 - 32 mmol/L   Glucose, Bld 88 70 - 99 mg/dL   BUN 24 (H) 8 - 23 mg/dL   Creatinine, Ser 2.09 (H) 0.44 - 1.00 mg/dL   Calcium 9.5 8.9 - 47.0 mg/dL   Total Protein 8.0 6.5 - 8.1 g/dL   Albumin 3.6 3.5 - 5.0 g/dL   AST 45 (H) 15 - 41 U/L   ALT 22 0 - 44 U/L   Alkaline Phosphatase 35 (L) 38 - 126 U/L   Total Bilirubin 1.0 0.3 - 1.2 mg/dL   GFR, Estimated 55 (L) >60 mL/min   Anion gap 7 5 - 15  Troponin I (High Sensitivity)     Status: Abnormal   Collection Time: 07/25/21  1:01 AM  Result Value Ref Range   Troponin I (High Sensitivity) 91 (H) <18 ng/L  Brain natriuretic peptide     Status: Abnormal   Collection Time: 07/25/21  1:01 AM  Result Value Ref Range   B Natriuretic Peptide 479.0 (H) 0.0 - 100.0 pg/mL  Urinalysis, Routine w reflex microscopic Urine, Clean Catch     Status: Abnormal   Collection Time: 07/25/21  1:50 AM  Result Value Ref Range   Color, Urine YELLOW YELLOW   APPearance CLEAR CLEAR   Specific Gravity, Urine >=1.030 1.005 - 1.030   pH 6.0 5.0 - 8.0   Glucose, UA NEGATIVE NEGATIVE mg/dL   Hgb urine dipstick NEGATIVE NEGATIVE   Bilirubin Urine NEGATIVE NEGATIVE   Ketones, ur NEGATIVE NEGATIVE mg/dL   Protein, ur 962 (A) NEGATIVE mg/dL   Nitrite NEGATIVE NEGATIVE   Leukocytes,Ua TRACE (A) NEGATIVE  Urinalysis, Microscopic (reflex)     Status: Abnormal   Collection Time: 07/25/21  1:50 AM  Result Value Ref Range   RBC / HPF 0-5 0 - 5 RBC/hpf  WBC, UA 0-5 0 - 5 WBC/hpf   Bacteria, UA RARE (A) NONE SEEN   Squamous Epithelial / LPF 0-5 0 - 5   Hyaline Casts, UA PRESENT   Troponin I (High Sensitivity)     Status: Abnormal   Collection Time: 07/25/21  2:50 AM  Result Value Ref Range   Troponin I (High Sensitivity) 98 (H) <18 ng/L   Imaging Studies: CT Head Wo Contrast  Result Date: 07/25/2021 CLINICAL DATA:  Altered mental status. EXAM: CT HEAD WITHOUT CONTRAST TECHNIQUE: Contiguous axial images were obtained  from the base of the skull through the vertex without intravenous contrast. RADIATION DOSE REDUCTION: This exam was performed according to the departmental dose-optimization program which includes automated exposure control, adjustment of the mA and/or kV according to patient size and/or use of iterative reconstruction technique. COMPARISON:  July 14, 2021 FINDINGS: Brain: There is moderate severity cerebral atrophy with widening of the extra-axial spaces and ventricular dilatation. There are areas of decreased attenuation within the white matter tracts of the supratentorial brain, consistent with microvascular disease changes. There is bilateral, symmetric, basal ganglia calcification. Vascular: No hyperdense vessels are identified. Skull: Normal. Negative for fracture or focal lesion. Sinuses/Orbits: No acute finding. Other: None. IMPRESSION: 1. No acute intracranial abnormality. 2. Generalized cerebral atrophy with chronic white matter small vessel ischemic changes. Electronically Signed   By: Aram Candela M.D.   On: 07/25/2021 02:06   DG Chest 2 View  Result Date: 07/25/2021 CLINICAL DATA:  Shortness of breath. EXAM: CHEST - 2 VIEW COMPARISON:  06/18/2021 FINDINGS: Multilead left-sided pacemaker in place. Post median sternotomy with multiple prosthetic cardiac valves. Cardiomegaly is stable. Increasing vascular congestion. No significant pleural effusion. Basilar interstitial lung disease. Ill-defined bibasilar opacities, favoring atelectasis. No pneumothorax. The bones are under mineralized IMPRESSION: 1. Increasing vascular congestion. Stable cardiomegaly. 2. Ill-defined bibasilar opacities, favoring atelectasis superimposed on interstitial lung disease. Electronically Signed   By: Narda Rutherford M.D.   On: 07/25/2021 01:43    ED COURSE and MDM  Nursing notes, initial and subsequent vitals signs, including pulse oximetry, reviewed and interpreted by myself.  Vitals:   07/25/21 0245 07/25/21  0300 07/25/21 0400 07/25/21 0500  BP: (!) 144/75 (!) 144/75 (!) 143/84 (!) 167/82  Pulse: 70 71 70 72  Resp: Temp:      TempSrc:      SpO2: 99% 99% 100% 100%  Weight:      Height:       Medications  albuterol (VENTOLIN HFA) 108 (90 Base) MCG/ACT inhaler 2 puff (has no administration in time range)   3:45 AM The cause of the patient's altered mental status is unclear.  This could be an exacerbation of her dementia.  She does not appear to have a urinary tract infection or other infectious etiology.  Her troponins are elevated but she has a history of elevated troponins in the past so it is unclear if she has had any cardiac event.  The shortness of breath sounds exertional due to her wandering the house.  We will have her admitted for further evaluation.  4:34 AM Dr. Leafy Half accepts for admission to hospitalist service.  6:07 AM The patient's granddaughter asked me to step into the room to witness her grandmother's behavior.  The patient started accusing myself and other staff members of talking about her and laughing at her heart in the hallway.  Specifically she believes we thought she smelled bad and needed a bath and were  laughing about this.  These events did not happen and suggest to me the patient is having paranoid delusions if not out right hallucinations.  The patient's granddaughter tells me that this sort of behavior has been going on for the past several days and she was not able to verbalize it as well as she had wished, therefore she asked me into the room to witness myself.   PROCEDURES  Procedures   ED DIAGNOSES     ICD-10-CM   1. Paranoid delusion [facility])  F22          Amberlynn Tempesta, MD 07/25/21 0434    Paula Libra, MD 07/25/21 [phone]

## 2021-07-25 NOTE — Progress Notes (Signed)
Plan of Care Note for accepted transfer   Patient: Lindsey Huber MRN: [id number]   DOA: 07/25/2021  Facility requesting transfer: [facility] ED Requesting Provider: Dr. Read Drivers Reason for transfer: Confusion, Concern for Encephalopathy Facility course:   76 year old female with past medical history of dementia, idiopathic pulmonary fibrosis, SLE, bioprosthetic aortic valve replacement, bioprosthetic tricuspid valve replacement, diastolic congestive heart failure, ventricular tachycardia status post AICD, pulmonary hypertension, paroxysmal atrial fibrillation, adrenal insufficiency, hypertension, gout and hypothyroidism presenting to med [facility] emergency department brought in by family due to multiple complaints including agitation and confusion for the past 3 days.  Upon evaluation in the emergency department noncontrast CT imaging of the head was unremarkable.  Chest x-ray and urinalysis revealed no evidence of infection.  Patient was additionally found to have slightly elevated troponins of 91 and 98 without chest pain or EKG change.  Patient has been known to have slightly elevated troponin elevations in the past.  ER providers requesting hospitalization for continued work-up of her confusion, thought to be due to metabolic encephalopathy.  Patient accepted to first available telemetry bed at [facility] long or [facility].  Plan of care: The patient is accepted for admission to Telemetry unit, at [facility] or [facility].   Author: Marinda Elk, MD 07/25/2021  Check [email or web address] for on-call coverage.  Nursing staff, Please call [facility] Admits & Consults System-Wide number on Amion as soon as patient's arrival, so appropriate admitting provider can evaluate the pt.

## 2021-07-25 NOTE — ED Triage Notes (Signed)
Pt BIB family member for [facility] with exertion. Family member states that patient has dementia and was a silver alert earlier today. States that pt has not slept in 4 days and has been refusing to take her medications.

## 2021-07-25 NOTE — ED Notes (Signed)
Caprice Red (granddaughter) left phone number [phone]

## 2021-07-25 NOTE — ED Notes (Signed)
Ambulated patient to bathroom 1 assist steady gate

## 2021-07-25 NOTE — ED Notes (Signed)
ED Provider at bedside. 

## 2021-07-25 NOTE — ED Notes (Signed)
Patient offered a frozen meal Patient refused only wanted orange juice

## 2021-07-25 NOTE — ED Notes (Signed)
Patient wearing brief Checked the patients brief and is dry Offered restroom assistance patient stated that she does not have to void at this time

## 2021-07-25 NOTE — ED Notes (Signed)
Attempted to transport for imaging; ED staff @bedside .

## 2021-07-26 DIAGNOSIS — N183 Chronic kidney disease, stage 3 unspecified: Secondary | ICD-10-CM | POA: Diagnosis not present

## 2021-07-26 DIAGNOSIS — I5032 Chronic diastolic (congestive) heart failure: Secondary | ICD-10-CM | POA: Diagnosis not present

## 2021-07-26 DIAGNOSIS — E039 Hypothyroidism, unspecified: Secondary | ICD-10-CM | POA: Diagnosis not present

## 2021-07-26 DIAGNOSIS — G9341 Metabolic encephalopathy: Secondary | ICD-10-CM | POA: Diagnosis not present

## 2021-07-26 LAB — BASIC METABOLIC PANEL
Anion gap: 5 (ref 5–15)
BUN: 20 mg/dL (ref 8–23)
CO2: 26 mmol/L (ref 22–32)
Calcium: 8.9 mg/dL (ref 8.9–10.3)
Chloride: 107 mmol/L (ref 98–111)
Creatinine, Ser: 1.08 mg/dL — ABNORMAL HIGH (ref 0.44–1.00)
GFR, Estimated: 54 mL/min — ABNORMAL LOW (ref >60)
Glucose, Bld: 92 mg/dL (ref 70–99)
Potassium: 3.9 mmol/L (ref 3.5–5.1)
Sodium: 138 mmol/L (ref 135–145)

## 2021-07-26 LAB — VITAMIN B12: Vitamin B-12: 4814 pg/mL — ABNORMAL HIGH (ref 180–914)

## 2021-07-26 NOTE — Progress Notes (Signed)
[facility] PROGRESS NOTE    Progress Note  Lindsey Huber  [id number] DOB: 1945/03/09 DOA: 07/25/2021 PCP: Scheryl Marten, PA     Brief Narrative:   Lindsey Huber is an 76 y.o. female past medical history of chronic diastolic heart failure with a last 2D echo of 55%, moderately reduced RV function mild mitral regurgitation and severe bilateral enlargement, permanent atrial fibrillation on Xarelto lupus, pulmonary fibrosis not on oxygen on an attended followed by Dr. 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 with history of AVR, mitral valve ring placement and tricuspid valve replacement,, history of atrial flutter status post ablation also with a past medical history dementia who from [place] about a year and a half ago and since then her mentation has been gradually worsening, 2 days prior to admission was seen by her primary care doctor as she has not slept for over 2 days she was started on olanzapine which she has not taken since then she has become paranoid agitated and confused, most of the history was obtained from the granddaughter and daughter as the patient is paranoid and aggressive.  They relate she has not slept for over 4 days and is becoming paranoid and seeing and hearing things.   Assessment/Plan:   Acute metabolic encephalopathy in the setting of underlying dementia: She has remained afebrile, B12 and RBC folate are unremarkable. Awaiting physical therapy evaluation she relates some nausea.   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HCC) Stable not on oxygen monitor closely.  Systemic lupus erythematosus (HCC) Continue steroids and Plaquenil.  Chronic heart failure with preserved ejection fraction (HCC) Continue metoprolol hold Lasix complaining of  nausea.  Chronic kidney disease (CKD), active medical management without dialysis, stage 3 (moderate) (HCC) Creatinine appears to be at baseline.  Anemia of chronic disease Hemoglobin at baseline.  Acquired hypothyroidism Continue Synthroid.   DVT prophylaxis: lovenox Family Communication:none Status is: Observation The patient remains OBS appropriate and will d/c before 2 midnights.    Code Status:     Code Status Orders  (From admission, onward)           Start     Ordered   07/25/21 1708  Full code  Continuous        07/25/21 1708           Code Status History     Date Active Date Inactive Code Status Order ID Comments User Context   11/27/2020 1309 11/27/2020 2042 Full Code [id number]  Bensimhon, Shaune Pascal, MD Inpatient      Advance Directive Documentation    Flowsheet Row Most Recent Value  Type of Advance Directive Healthcare Power of [place], Living will  Pre-existing out of facility DNR order (yellow form or pink MOST form) --  "MOST" Form in Place? --         IV Access:   Peripheral IV   Procedures and diagnostic studies:   CT Head Wo Contrast  Result Date: 07/25/2021 CLINICAL DATA:  Altered mental status. EXAM: CT HEAD WITHOUT CONTRAST TECHNIQUE: Contiguous axial images were obtained from the base of the skull through the vertex without intravenous contrast. RADIATION DOSE REDUCTION: This exam was performed according to the departmental dose-optimization program which includes automated exposure control, adjustment of the mA and/or kV according to patient size and/or use of iterative reconstruction technique. COMPARISON:  July 14, 2021 FINDINGS: Brain: There is moderate severity cerebral atrophy with widening of the extra-axial spaces and ventricular dilatation. There are areas of decreased attenuation within the white matter tracts of the supratentorial brain, consistent with microvascular disease changes. There is bilateral, symmetric, basal  ganglia calcification. Vascular: No hyperdense vessels are identified. Skull: Normal. Negative for fracture or focal lesion. Sinuses/Orbits: No acute finding. Other: None. IMPRESSION: 1. No acute intracranial abnormality. 2. Generalized cerebral atrophy with chronic white matter small vessel ischemic changes. Electronically Signed   By: Aram Candela M.D.   On: 07/25/2021 02:06   DG Chest 2 View  Result Date: 07/25/2021 CLINICAL DATA:  Shortness of breath. EXAM: CHEST - 2 VIEW COMPARISON:  06/18/2021 FINDINGS: Multilead left-sided pacemaker in place. Post median sternotomy with multiple prosthetic cardiac valves. Cardiomegaly is stable. Increasing vascular congestion. No significant pleural effusion. Basilar interstitial lung disease. Ill-defined bibasilar opacities, favoring atelectasis. No pneumothorax. The bones are under mineralized IMPRESSION: 1. Increasing vascular congestion. Stable cardiomegaly. 2. Ill-defined bibasilar opacities, favoring atelectasis superimposed on interstitial lung disease. Electronically Signed   By: Narda Rutherford M.D.   On: 07/25/2021 01:43     Medical Consultants:   None.   Subjective:    Lindsey Huber complaining of some nausea no agitations overnight not hungry this morning  Objective:    Vitals:   07/25/21 2000 07/25/21 2134 07/26/21 0238 07/26/21 0445  BP:  131/74 131/71 (!) 118/59  Pulse:  70 70 69  Resp: (!) 22 16 17 18   Temp:  97.6 F (36.4 C) 98.1 F (36.7 C) 97.6 F (36.4 C)  TempSrc:  Oral Axillary Oral  SpO2:  99% 97% 98%  Weight:      Height:       SpO2: 98 %   Intake/Output Summary (Last 24 hours) at 07/26/2021 1008 Last data filed at 07/26/2021 0400 Gross per 24 hour  Intake 237 ml  Output --  Net 237 ml   Filed Weights   07/25/21 0041  Weight: 54.4 kg    Exam: General exam: In no acute distress. Respiratory system: Good air movement and clear to auscultation. Cardiovascular system: S1 & S2 heard, RRR. No  JVD. Gastrointestinal system: Abdomen is nondistended, soft and nontender.  Extremities: No pedal edema. Skin: No rashes, lesions or ulcers Psychiatry: Judgement and insight appear normal. Mood & affect appropriate.   Data Reviewed:    Labs: Basic Metabolic Panel: Recent Labs  Lab 07/25/21 0101 07/26/21 0608  NA 137 138  K 3.7 3.9  CL 103 107  CO2 27 26  GLUCOSE 88 92  BUN 24* 20  CREATININE 1.07* 1.08*  CALCIUM 9.5 8.9   GFR Estimated Creatinine Clearance: 37.8 mL/min (A) (by C-G formula based on SCr of 1.08 mg/dL (H)). Liver Function Tests: Recent Labs  Lab 07/25/21 0101  AST 45*  ALT 22  ALKPHOS 35*  BILITOT 1.0  PROT 8.0  ALBUMIN 3.6   No results for input(s): "LIPASE", "AMYLASE" in the last 168 hours. No results for input(s): "AMMONIA" in the last 168 hours. Coagulation profile No results for input(s): "INR", "PROTIME" in the last 168 hours. COVID-19 Labs  No results for input(s): "DDIMER", "FERRITIN", "LDH", "CRP" in the last 72 hours.  Lab Results  Component Value Date   SARSCOV2NAA NEGATIVE 03/14/2021   SARSCOV2NAA NEGATIVE 12/13/2020    CBC: Recent Labs  Lab 07/25/21 0101  WBC 5.2  NEUTROABS 3.5  HGB 11.2*  HCT 33.8*  MCV 100.3*  PLT 153  Cardiac Enzymes: No results for input(s): "CKTOTAL", "CKMB", "CKMBINDEX", "TROPONINI" in the last 168 hours. BNP (last 3 results) No results for input(s): "PROBNP" in the last 8760 hours. CBG: No results for input(s): "GLUCAP" in the last 168 hours. D-Dimer: No results for input(s): "DDIMER" in the last 72 hours. Hgb A1c: No results for input(s): "HGBA1C" in the last 72 hours. Lipid Profile: No results for input(s): "CHOL", "HDL", "LDLCALC", "TRIG", "CHOLHDL", "LDLDIRECT" in the last 72 hours. Thyroid function studies: Recent Labs    07/25/21 1818  TSH 2.075   Anemia work up: Recent Labs    07/25/21 1818  VITAMINB12 4,814*   Sepsis Labs: Recent Labs  Lab 07/25/21 0101  WBC 5.2    Microbiology No results found for this or any previous visit (from the past 240 hour(s)).   Medications:    allopurinol  100 mg Oral Daily   feeding supplement  237 mL Oral BID BM   ferrous sulfate  325 mg Oral BID WC   folic acid  1 mg Oral Q lunch   [START ON 07/27/2021] furosemide  40 mg Oral Daily   levothyroxine  25 mcg Oral Q0600   metoprolol succinate  25 mg Oral Daily   OLANZapine  2.5 mg Oral QHS   polyethylene glycol  17 g Oral BID   Rivaroxaban  15 mg Oral Q supper   sodium chloride flush  3 mL Intravenous Q12H   vitamin B-12  1,000 mcg Oral Q lunch   Continuous Infusions:  sodium chloride        LOS: 0 days   Marinda Elk  [facility]  07/26/2021, 10:08 AM

## 2021-07-26 NOTE — Progress Notes (Signed)
Nurse contacted provider Shalhoub, MD in reference to patient's urinary output. Patient hasn't voided during shift. Has cardiac hx. Poor PO intake. Bladder scanned and noted in bladder. No new orders at this time. Will continue to monitor.

## 2021-07-26 NOTE — Progress Notes (Signed)
Nurse spoke with provider Martyn Malay, MD in reference to the patient's urinary output. No news orders at this time. Will continue to monitor.

## 2021-07-27 DIAGNOSIS — G9341 Metabolic encephalopathy: Secondary | ICD-10-CM | POA: Diagnosis not present

## 2021-07-27 DIAGNOSIS — I5032 Chronic diastolic (congestive) heart failure: Secondary | ICD-10-CM | POA: Diagnosis not present

## 2021-07-27 DIAGNOSIS — E039 Hypothyroidism, unspecified: Secondary | ICD-10-CM | POA: Diagnosis not present

## 2021-07-27 NOTE — Discharge Summary (Addendum)
Physician Discharge Summary  Lindsey Huber JAS:[id number] DOB: February 08, 1945 DOA: 07/25/2021  PCP: Collene Mares, PA  Admit date: 07/25/2021 Discharge date: 07/27/2021  Admitted From: Home Disposition:  Home Recommendations for Outpatient Follow-up:  Follow up with PCP in 1-2 weeks Please obtain BMP/CBC in one week   Home Health:No Equipment/Devices:None  Discharge Condition:Stable CODE STATUS:Full Diet recommendation: Heart Healthy  Brief/Interim Summary: 76 y.o. female past medical history of chronic diastolic heart failure with a last 2D echo of 55%, moderately reduced RV function mild mitral regurgitation and severe bilateral enlargement, permanent atrial fibrillation on Xarelto lupus, pulmonary fibrosis not on oxygen on an attended followed by 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  They relate she has not slept for over 4 days and is becoming paranoid and seeing and hearing things.  Discharge Diagnoses:  Principal Problem:   Acute metabolic encephalopathy Active Problems:   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HCC)   Systemic lupus erythematosus (HCC)   Chronic heart failure with  preserved ejection fraction (HCC)   Chronic kidney disease (CKD), active medical management without dialysis, stage 3 (moderate) (HCC)   Anemia of chronic disease   Acquired hypothyroidism   Metabolic encephalopathy  Acute metabolic encephalopathy in the setting of underlying dementia: She remained afebrile B12 and RBC folate were unremarkable as well as her TSH. No source of infection etiology identified. She will continue olanzapine as prescribed by her primary care doctor as an outpatient. We will need to evaluate as an outpatient by PCP and further titrate or start new medications for her worsening dementia. Physical therapy evaluated the patient recommended home health PT.  IPF: Stable on oxygen.  SLE: Continue steroids and Plaquenil.  Chronic heart failure with preserved ejection fraction: No change made to her medication she was continue current regimen.  Chronic kidney disease stage IIIb: Creatinine appears to be at baseline.  Chronic renal disease: Hemoglobin stable.  Acquired hypothyroidism: Continue Synthroid.  Discharge Instructions  Discharge Instructions     Diet - low sodium heart healthy   Complete by: As directed    Increase activity slowly   Complete by: As directed       Allergies as of 07/27/2021       Reactions   Propofol Other (See Comments)   "Caused asthma"   Flecainide Other (See Comments)   Reaction not recalled   Amiodarone Other (See Comments)   Delirium/Confusion/Psychosis   Amlodipine Other (See Comments)   "Tired and syncope"        Medication List     STOP taking these medications    folic acid 800 MCG tablet Commonly known as: FOLVITE       TAKE these medications    acetaminophen 500 MG  tablet Commonly known as: TYLENOL Take 1,000 mg by mouth every 6 (six) hours as needed (pain).   albuterol 108 (90 Base) MCG/ACT inhaler Commonly known as: VENTOLIN HFA Inhale 2 puffs into the lungs every 6 (six) hours as needed  for wheezing or shortness of breath.   allopurinol 100 MG tablet Commonly known as: ZYLOPRIM TAKE 1 TABLET BY MOUTH EVERY DAY   Breztri Aerosphere 160-9-4.8 MCG/ACT Aero Generic drug: Budeson-Glycopyrrol-Formoterol Inhale 2 puffs into the lungs 2 (two) times daily as needed ("for flares").   famotidine 20 MG tablet Commonly known as: PEPCID Take 20 mg by mouth at bedtime as needed for heartburn or indigestion.   ferrous sulfate 325 (65 FE) MG tablet Take 325 mg by mouth See admin instructions. Take 325 mg by mouth with breakfast and supper   furosemide 20 MG tablet Commonly known as: LASIX Take 20 mg by mouth See admin instructions. Take one tablet (20 mg) by mouth every morning, Hold if BP is lower than 110/60   furosemide 40 MG tablet Commonly known as: LASIX Take 40 mg by mouth See admin instructions. Take 40 mg by mouth in the morning (scheduled) and an additional 40 mg at bedtime if swelling is present. HOLD IF SYSTOLIC READING IS 123456 OR DIASTOLIC READING IS 123XX123.   hydroxychloroquine 200 MG tablet Commonly known as: PLAQUENIL Take 1 tablet (200 mg total) by mouth daily.   levothyroxine 25 MCG tablet Commonly known as: SYNTHROID Take 1 tablet (25 mcg total) by mouth daily. What changed: when to take this   metoprolol succinate 25 MG 24 hr tablet Commonly known as: TOPROL-XL Take 1 tablet (25 mg total) by mouth daily. Hold dose if <110 SBP or <60 DBP What changed:  when to take this additional instructions   multivitamin with minerals tablet Take 1 tablet by mouth daily with lunch.   Ofev 150 MG Caps Generic drug: Nintedanib Take 150 mg by mouth at bedtime.   OLANZapine 2.5 MG tablet Commonly known as: ZyPREXA Take 1 tablet (2.5 mg total) by mouth at bedtime.   predniSONE 5 MG tablet Commonly known as: DELTASONE Take 1 tablet (5 mg total) by mouth daily with breakfast. Patient to double up dose during sick day rule   Rivaroxaban 15 MG Tabs tablet Commonly  known as: XARELTO Take 1 tablet (15 mg total) by mouth daily with supper.   vitamin B-12 1000 MCG tablet Commonly known as: CYANOCOBALAMIN Take 1,000 mcg by mouth daily with lunch.   Vitamin D3 25 MCG (1000 UT) Caps Take 1,000 Units by mouth daily with lunch.        Allergies  Allergen Reactions   Propofol Other (See Comments)    "Caused asthma"   Flecainide Other (See Comments)    Reaction not recalled   Amiodarone Other (See Comments)    Delirium/Confusion/Psychosis   Amlodipine Other (See Comments)    "Tired and syncope"    Consultations: None   Procedures/Studies: CT Head Wo Contrast  Result Date: 07/25/2021 CLINICAL DATA:  Altered mental status. EXAM: CT HEAD WITHOUT CONTRAST TECHNIQUE: Contiguous axial images were obtained from the base of the skull through the vertex without intravenous contrast. RADIATION DOSE REDUCTION: This exam was performed according to the departmental dose-optimization program which includes automated exposure control, adjustment of the mA and/or kV according to patient size and/or use of iterative reconstruction technique. COMPARISON:  July 14, 2021 FINDINGS: Brain: There is moderate severity cerebral atrophy with widening of the extra-axial spaces and ventricular  dilatation. There are areas of decreased attenuation within the white matter tracts of the supratentorial brain, consistent with microvascular disease changes. There is bilateral, symmetric, basal ganglia calcification. Vascular: No hyperdense vessels are identified. Skull: Normal. Negative for fracture or focal lesion. Sinuses/Orbits: No acute finding. Other: None. IMPRESSION: 1. No acute intracranial abnormality. 2. Generalized cerebral atrophy with chronic white matter small vessel ischemic changes. Electronically Signed   By: Virgina Norfolk M.D.   On: 07/25/2021 02:06   DG Chest 2 View  Result Date: 07/25/2021 CLINICAL DATA:  Shortness of breath. EXAM: CHEST - 2 VIEW COMPARISON:   06/18/2021 FINDINGS: Multilead left-sided pacemaker in place. Post median sternotomy with multiple prosthetic cardiac valves. Cardiomegaly is stable. Increasing vascular congestion. No significant pleural effusion. Basilar interstitial lung disease. Ill-defined bibasilar opacities, favoring atelectasis. No pneumothorax. The bones are under mineralized IMPRESSION: 1. Increasing vascular congestion. Stable cardiomegaly. 2. Ill-defined bibasilar opacities, favoring atelectasis superimposed on interstitial lung disease. Electronically Signed   By: Keith Rake M.D.   On: 07/25/2021 01:43   CT HEAD WO CONTRAST (5MM)  Result Date: 07/15/2021 CLINICAL DATA:  Memory loss, cognitive deficit EXAM: CT HEAD WITHOUT CONTRAST TECHNIQUE: Contiguous axial images were obtained from the base of the skull through the vertex without intravenous contrast. RADIATION DOSE REDUCTION: This exam was performed according to the departmental dose-optimization program which includes automated exposure control, adjustment of the mA and/or kV according to patient size and/or use of iterative reconstruction technique. COMPARISON:  06/18/2021 FINDINGS: Brain: Ventricle size and cerebral volume normal for age. Moderate white matter changes with patchy white matter hypodensity bilaterally, unchanged. Negative for acute infarct, hemorrhage, mass. Physiologic calcification basal ganglia bilaterally is unchanged. Vascular: Negative for hyperdense vessel Skull: Negative Sinuses/Orbits: Negative Other: None IMPRESSION: Moderate chronic microvascular ischemic change in the white matter. No acute abnormality no change from the recent study. Cerebral volume normal for age. Electronically Signed   By: Franchot Gallo M.D.   On: 07/15/2021 15:39   (Echo, Carotid, EGD, Colonoscopy, ERCP)    Subjective: No complaints  Discharge Exam: Vitals:   07/26/21 2100 07/27/21 0546  BP: 111/70 118/77  Pulse: 69 71  Resp:    Temp: 98.6 F (37 C) 98.3 F  (36.8 C)  SpO2: 100% 98%   Vitals:   07/26/21 0445 07/26/21 1422 07/26/21 2100 07/27/21 0546  BP: (!) 118/59 (!) 105/53 111/70 118/77  Pulse: 69 70 69 71  Resp: 18 19    Temp: 97.6 F (36.4 C) 98.2 F (36.8 C) 98.6 F (37 C) 98.3 F (36.8 C)  TempSrc: Oral Oral Oral Oral  SpO2: 98% 100% 100% 98%  Weight:      Height:        General: Pt is alert, awake, not in acute distress Cardiovascular: RRR, S1/S2 +, no rubs, no gallops Respiratory: CTA bilaterally, no wheezing, no rhonchi Abdominal: Soft, NT, ND, bowel sounds + Extremities: no edema, no cyanosis    The results of significant diagnostics from this hospitalization (including imaging, microbiology, ancillary and laboratory) are listed below for reference.     Microbiology: No results found for this or any previous visit (from the past 240 hour(s)).   Labs: BNP (last 3 results) Recent Labs    10/27/20 1556 12/13/20 1846 07/25/21 0101  BNP 419.2* 117.6* [id number]   Basic Metabolic Panel: Recent Labs  Lab 07/25/21 0101 07/26/21 0608  NA 137 138  K 3.7 3.9  CL 103 107  CO2 27 26  GLUCOSE 88 92  BUN 24* 20  CREATININE 1.07* 1.08*  CALCIUM 9.5 8.9   Liver Function Tests: Recent Labs  Lab 07/25/21 0101  AST 45*  ALT 22  ALKPHOS 35*  BILITOT 1.0  PROT 8.0  ALBUMIN 3.6   No results for input(s): "LIPASE", "AMYLASE" in the last 168 hours. No results for input(s): "AMMONIA" in the last 168 hours. CBC: Recent Labs  Lab 07/25/21 0101  WBC 5.2  NEUTROABS 3.5  HGB 11.2*  HCT 33.8*  MCV 100.3*  PLT 153   Cardiac Enzymes: No results for input(s): "CKTOTAL", "CKMB", "CKMBINDEX", "TROPONINI" in the last 168 hours. BNP: Invalid input(s): "POCBNP" CBG: No results for input(s): "GLUCAP" in the last 168 hours. D-Dimer No results for input(s): "DDIMER" in the last 72 hours. Hgb A1c No results for input(s): "HGBA1C" in the last 72 hours. Lipid Profile No results for input(s): "CHOL", "HDL", "LDLCALC",  "TRIG", "CHOLHDL", "LDLDIRECT" in the last 72 hours. Thyroid function studies Recent Labs    07/25/21 1818  TSH 2.075   Anemia work up Recent Labs    07/25/21 1818  VITAMINB12 4,814*   Urinalysis    Component Value Date/Time   COLORURINE YELLOW 07/25/2021 0150   APPEARANCEUR CLEAR 07/25/2021 0150   LABSPEC >=1.030 07/25/2021 0150   PHURINE 6.0 07/25/2021 0150   GLUCOSEU NEGATIVE 07/25/2021 0150   HGBUR NEGATIVE 07/25/2021 0150   BILIRUBINUR NEGATIVE 07/25/2021 0150   KETONESUR NEGATIVE 07/25/2021 0150   PROTEINUR 100 (A) 07/25/2021 0150   NITRITE NEGATIVE 07/25/2021 0150   LEUKOCYTESUR TRACE (A) 07/25/2021 0150   Sepsis Labs Recent Labs  Lab 07/25/21 0101  WBC 5.2   Microbiology No results found for this or any previous visit (from the past 240 hour(s)).  SIGNED:   Marinda Elk, MD  [facility] 07/27/2021, 10:02 AM Pager   If 7PM-7AM, please contact night-coverage [email or web address] Password [email or web address]

## 2021-07-27 NOTE — Progress Notes (Signed)
  Transition of Care [facility]) Screening Note   Patient Details  Name: Lindsey Huber Date of Birth: September 16, 1945   Transition of Care [facility]) CM/SW Contact:    Otelia Santee, LCSW Phone Number: 07/27/2021, 1:18 PM    Transition of Care Department [facility]) has reviewed patient and no TOC needs have been identified at this time. We will continue to monitor patient advancement through interdisciplinary progression rounds. If new patient transition needs arise, please place a TOC consult.

## 2021-07-27 NOTE — Plan of Care (Signed)

## 2021-07-27 NOTE — Progress Notes (Signed)
Nursing Discharge Note   Admit Date: 07/25/2021  Discharge date: 07/27/2021   Lindsey Huber is  to be discharged home per MD order.  AVS completed. Reviewed with patient and granddaughter Aisha at bedside. Highlighted copy provided for patient to take home.  Patient and granddaughter able to verbalize understanding of discharge instructions. PIV removed. Patient stable upon discharge.   Discharge Instructions     Diet - low sodium heart healthy   Complete by: As directed    Increase activity slowly   Complete by: As directed        Allergies as of 07/27/2021       Reactions   Propofol Other (See Comments)   "Caused asthma"   Flecainide Other (See Comments)   Reaction not recalled   Amiodarone Other (See Comments)   Delirium/Confusion/Psychosis   Amlodipine Other (See Comments)   "Tired and syncope"        Medication List     STOP taking these medications    folic acid 800 MCG tablet Commonly known as: FOLVITE       TAKE these medications    acetaminophen 500 MG tablet Commonly known as: TYLENOL Take 1,000 mg by mouth every 6 (six) hours as needed (pain).   albuterol 108 (90 Base) MCG/ACT inhaler Commonly known as: VENTOLIN HFA Inhale 2 puffs into the lungs every 6 (six) hours as needed for wheezing or shortness of breath.   allopurinol 100 MG tablet Commonly known as: ZYLOPRIM TAKE 1 TABLET BY MOUTH EVERY DAY   Breztri Aerosphere 160-9-4.8 MCG/ACT Aero Generic drug: Budeson-Glycopyrrol-Formoterol Inhale 2 puffs into the lungs 2 (two) times daily as needed ("for flares").   famotidine 20 MG tablet Commonly known as: PEPCID Take 20 mg by mouth at bedtime as needed for heartburn or indigestion.   ferrous sulfate 325 (65 FE) MG tablet Take 325 mg by mouth See admin instructions. Take 325 mg by mouth with breakfast and supper   furosemide 20 MG tablet Commonly known as: LASIX Take 20 mg by mouth See admin instructions. Take one tablet (20 mg) by mouth  every morning, Hold if BP is lower than 110/60   furosemide 40 MG tablet Commonly known as: LASIX Take 40 mg by mouth See admin instructions. Take 40 mg by mouth in the morning (scheduled) and an additional 40 mg at bedtime if swelling is present. HOLD IF SYSTOLIC READING IS <110 OR DIASTOLIC READING IS <60.   hydroxychloroquine 200 MG tablet Commonly known as: PLAQUENIL Take 1 tablet (200 mg total) by mouth daily.   levothyroxine 25 MCG tablet Commonly known as: SYNTHROID Take 1 tablet (25 mcg total) by mouth daily. What changed: when to take this   metoprolol succinate 25 MG 24 hr tablet Commonly known as: TOPROL-XL Take 1 tablet (25 mg total) by mouth daily. Hold dose if <110 SBP or <60 DBP What changed:  when to take this additional instructions   multivitamin with minerals tablet Take 1 tablet by mouth daily with lunch.   Ofev 150 MG Caps Generic drug: Nintedanib Take 150 mg by mouth at bedtime.   OLANZapine 2.5 MG tablet Commonly known as: ZyPREXA Take 1 tablet (2.5 mg total) by mouth at bedtime.   predniSONE 5 MG tablet Commonly known as: DELTASONE Take 1 tablet (5 mg total) by mouth daily with breakfast. Patient to double up dose during sick day rule   Rivaroxaban 15 MG Tabs tablet Commonly known as: XARELTO Take 1 tablet (15 mg total) by mouth  daily with supper.   vitamin B-12 1000 MCG tablet Commonly known as: CYANOCOBALAMIN Take 1,000 mcg by mouth daily with lunch.   Vitamin D3 25 MCG (1000 UT) Caps Take 1,000 Units by mouth daily with lunch.         Discharge Instructions/ Education: Discharge instructions given to patient/family with verbalized understanding. Discharge education completed with patient/family including: follow up instructions, medication list, discharge activities, and limitations if indicated. Patient instructed to return to Emergency Department, call 911, or call MD for any changes in condition.  Patient escorted via wheelchair to  lobby and discharged home via private automobile.

## 2021-07-29 LAB — FOLATE RBC
Folate, Hemolysate: >620 ng/mL
Folate, RBC: UNDETERMINED ng/mL

## 2021-07-29 LAB — HEMATOLOGY COMMENTS:

## 2021-07-30 ENCOUNTER — Emergency Department (HOSPITAL_COMMUNITY): Payer: Medicare HMO

## 2021-07-30 ENCOUNTER — Other Ambulatory Visit: Payer: Self-pay

## 2021-07-30 ENCOUNTER — Encounter (HOSPITAL_COMMUNITY): Payer: Self-pay | Admitting: *Deleted

## 2021-07-30 ENCOUNTER — Emergency Department (HOSPITAL_COMMUNITY)
Admission: EM | Admit: 2021-07-30 | Discharge: 2021-07-30 | Disposition: A | Discharge status: Home / Self Care | Payer: Medicare HMO | Attending: Emergency Medicine | Admitting: Emergency Medicine

## 2021-07-30 DIAGNOSIS — I5030 Unspecified diastolic (congestive) heart failure: Secondary | ICD-10-CM | POA: Insufficient documentation

## 2021-07-30 DIAGNOSIS — Z950 Presence of cardiac pacemaker: Secondary | ICD-10-CM | POA: Diagnosis not present

## 2021-07-30 DIAGNOSIS — M7989 Other specified soft tissue disorders: Secondary | ICD-10-CM | POA: Insufficient documentation

## 2021-07-30 DIAGNOSIS — R011 Cardiac murmur, unspecified: Secondary | ICD-10-CM | POA: Insufficient documentation

## 2021-07-30 DIAGNOSIS — Z7901 Long term (current) use of anticoagulants: Secondary | ICD-10-CM | POA: Diagnosis not present

## 2021-07-30 DIAGNOSIS — F03918 Unspecified dementia, unspecified severity, with other behavioral disturbance: Secondary | ICD-10-CM | POA: Insufficient documentation

## 2021-07-30 DIAGNOSIS — F0390 Unspecified dementia without behavioral disturbance: Secondary | ICD-10-CM | POA: Diagnosis present

## 2021-07-30 DIAGNOSIS — I4891 Unspecified atrial fibrillation: Secondary | ICD-10-CM | POA: Insufficient documentation

## 2021-07-30 LAB — CBC WITH DIFFERENTIAL/PLATELET
Abs Immature Granulocytes: 0.01 10*3/uL (ref 0.00–0.07)
Basophils Absolute: 0 10*3/uL (ref 0.0–0.1)
Basophils Relative: 0 %
Eosinophils Absolute: 0.1 10*3/uL (ref 0.0–0.5)
Eosinophils Relative: 2 %
HCT: 34.4 % — ABNORMAL LOW (ref 36.0–46.0)
Hemoglobin: 11.1 g/dL — ABNORMAL LOW (ref 12.0–15.0)
Immature Granulocytes: 0 %
Lymphocytes Relative: 26 %
Lymphs Abs: 1.4 10*3/uL (ref 0.7–4.0)
MCH: 33.1 pg (ref 26.0–34.0)
MCHC: 32.3 g/dL (ref 30.0–36.0)
MCV: 102.7 fL — ABNORMAL HIGH (ref 80.0–100.0)
Monocytes Absolute: 0.5 10*3/uL (ref 0.1–1.0)
Monocytes Relative: 9 %
Neutro Abs: 3.4 10*3/uL (ref 1.7–7.7)
Neutrophils Relative %: 63 %
Platelets: 149 10*3/uL — ABNORMAL LOW (ref 150–400)
RBC: 3.35 MIL/uL — ABNORMAL LOW (ref 3.87–5.11)
RDW: 13.9 % (ref 11.5–15.5)
WBC: 5.5 10*3/uL (ref 4.0–10.5)
nRBC: 0 % (ref 0.0–0.2)

## 2021-07-30 LAB — COMPREHENSIVE METABOLIC PANEL
ALT: 23 U/L (ref 0–44)
AST: 41 U/L (ref 15–41)
Albumin: 3.7 g/dL (ref 3.5–5.0)
Alkaline Phosphatase: 41 U/L (ref 38–126)
Anion gap: 9 (ref 5–15)
BUN: 26 mg/dL — ABNORMAL HIGH (ref 8–23)
CO2: 25 mmol/L (ref 22–32)
Calcium: 8.9 mg/dL (ref 8.9–10.3)
Chloride: 106 mmol/L (ref 98–111)
Creatinine, Ser: 1.15 mg/dL — ABNORMAL HIGH (ref 0.44–1.00)
GFR, Estimated: 50 mL/min — ABNORMAL LOW (ref >60)
Glucose, Bld: 88 mg/dL (ref 70–99)
Potassium: 3.4 mmol/L — ABNORMAL LOW (ref 3.5–5.1)
Sodium: 140 mmol/L (ref 135–145)
Total Bilirubin: 0.9 mg/dL (ref 0.3–1.2)
Total Protein: 8.1 g/dL (ref 6.5–8.1)

## 2021-07-30 LAB — URINALYSIS, ROUTINE W REFLEX MICROSCOPIC
Bacteria, UA: NONE SEEN
Bilirubin Urine: NEGATIVE
Glucose, UA: NEGATIVE mg/dL
Hgb urine dipstick: NEGATIVE
Ketones, ur: NEGATIVE mg/dL
Leukocytes,Ua: NEGATIVE
Nitrite: NEGATIVE
Protein, ur: 30 mg/dL — AB
Specific Gravity, Urine: 1.024 (ref 1.005–1.030)
pH: 5 (ref 5.0–8.0)

## 2021-07-30 NOTE — ED Provider Triage Note (Signed)
Emergency Medicine Provider Triage Evaluation Note  Lindsey Huber , a 76 y.o. female  was evaluated in triage.  Pt complains of failure to thrive.  Patient herself has no complaints, patient's granddaughter who she lives with states patient has not been taking her medicine since being discharged from the hospital 3 days.  Not eating or drinking, basically failing to thrive.  No fevers, vomiting, chest pain, lower extremity weakness or numbness.  Review of Systems  Per HPI  Physical Exam  BP (!) 160/82 (BP Location: Left Arm)   Pulse 70   Temp 97.9 F (36.6 C) (Oral)   Resp 16   Ht 5\' 3"  (1.6 m)   Wt 54.4 kg   SpO2 100%   BMI 21.26 kg/m  Gen:   Awake, no distress   Resp:  Normal effort  MSK:   Moves extremities without difficulty  Other:    Medical Decision Making  Medically screening exam initiated at 3:16 PM.  Appropriate orders placed.  Lindsey Huber was informed that the remainder of the evaluation will be completed by another provider, this initial triage assessment does not replace that evaluation, and the importance of remaining in the ED until their evaluation is complete.      [name], PA-C 07/30/21 1517

## 2021-07-30 NOTE — ED Notes (Signed)
Unable to collect blood at this time.  

## 2021-07-30 NOTE — ED Triage Notes (Signed)
Family reports pt is not eating and taking meds, Dx Dementia, recently hospitalized for same

## 2021-07-30 NOTE — ED Provider Notes (Signed)
[facility]-EMERGENCY DEPT Provider Note   CSN: [id number] Arrival date & time: 07/30/21  1439     History  Chief Complaint  Patient presents with   Dementia   Not Eating or taking meds    Lindsey Huber is a 76 y.o. female.  Patient is a 76 year old female with a history of recently diagnosed dementia, diastolic heart failure with a EF of 55%, chronic A-fib on Xarelto, pulmonary fibrosis, ICD/pacemaker placement, valvular heart disease who presents not eating or drinking.  Her granddaughter is at bedside who reports that the patient has not been taking her pills although they have only noticed this today.  She was holding pills in her mouth.  The patient says this was only today.  She has not been eating or drinking well.  She was recently admitted to the hospital from July 1 to July 3 for some behavioral issues and ultimately felt to be due to probable dementia.  They have not noticed any behavioral issues over the last few days but she has not been eating or drinking well.  No fevers.  No cough or cold symptoms.  No vomiting or diarrhea.       Home Medications Prior to Admission medications   Medication Sig Start Date End Date Taking? Authorizing Provider  acetaminophen (TYLENOL) 500 MG tablet Take 1,000 mg by mouth every 6 (six) hours as needed (pain).    [provider]  albuterol (VENTOLIN HFA) 108 (90 Base) MCG/ACT inhaler Inhale 2 puffs into the lungs every 6 (six) hours as needed for wheezing or shortness of breath.    [provider]  allopurinol (ZYLOPRIM) 100 MG tablet TAKE 1 TABLET BY MOUTH EVERY DAY Patient taking differently: Take 100 mg by mouth daily. 07/22/21   Rice, Jamesetta Orleans, MD  Budeson-Glycopyrrol-Formoterol (BREZTRI AEROSPHERE) 160-9-4.8 MCG/ACT AERO Inhale 2 puffs into the lungs 2 (two) times daily as needed ("for flares").    [provider]  Cholecalciferol (VITAMIN D3) 25 MCG (1000 UT) CAPS Take 1,000 Units  by mouth daily with lunch.    [provider]  famotidine (PEPCID) 20 MG tablet Take 20 mg by mouth at bedtime as needed for heartburn or indigestion. 07/03/21   [provider]  ferrous sulfate 325 (65 FE) MG tablet Take 325 mg by mouth See admin instructions. Take 325 mg by mouth with breakfast and supper    [provider]  furosemide (LASIX) 20 MG tablet Take 20 mg by mouth See admin instructions. Take one tablet (20 mg) by mouth every morning, Hold if BP is lower than 110/60 Patient not taking: Reported on 07/25/2021    [provider]  furosemide (LASIX) 40 MG tablet Take 40 mg by mouth See admin instructions. Take 40 mg by mouth in the morning (scheduled) and an additional 40 mg at bedtime if swelling is present. HOLD IF SYSTOLIC READING IS <110 OR DIASTOLIC READING IS <60.    [provider]  hydroxychloroquine (PLAQUENIL) 200 MG tablet Take 1 tablet (200 mg total) by mouth daily. 04/10/21   Rice, Jamesetta Orleans, MD  levothyroxine (SYNTHROID) 25 MCG tablet Take 1 tablet (25 mcg total) by mouth daily. Patient taking differently: Take 25 mcg by mouth daily before breakfast. 02/25/21   Shamleffer, Konrad Dolores, MD  metoprolol succinate (TOPROL-XL) 25 MG 24 hr tablet Take 1 tablet (25 mg total) by mouth daily. Hold dose if <110 SBP or <60 DBP Patient taking differently: Take 25 mg by mouth See admin  instructions. Take 25 mg by mouth once a day and HOLD IF systolic reading is <110 OR diastolic reading is <60 04/23/21   Bensimhon, Bevelyn Buckles, MD  Multiple Vitamins-Minerals (MULTIVITAMIN WITH MINERALS) tablet Take 1 tablet by mouth daily with lunch.    [provider]  Nintedanib (OFEV) 150 MG CAPS Take 150 mg by mouth at bedtime.    [provider]  OLANZapine (ZYPREXA) 2.5 MG tablet Take 1 tablet (2.5 mg total) by mouth at bedtime. Patient not taking: Reported on 07/25/2021 07/24/21   Drema Dallas, DO  predniSONE (DELTASONE) 5 MG tablet Take 1  tablet (5 mg total) by mouth daily with breakfast. Patient to double up dose during sick day rule 02/20/21   Shamleffer, Konrad Dolores, MD  Rivaroxaban (XARELTO) 15 MG TABS tablet Take 1 tablet (15 mg total) by mouth daily with supper. 04/23/21   Bensimhon, Bevelyn Buckles, MD  vitamin B-12 (CYANOCOBALAMIN) 1000 MCG tablet Take 1,000 mcg by mouth daily with lunch.    [provider]      Allergies    Propofol, Flecainide, Amiodarone, and Amlodipine    Review of Systems   Review of Systems  Unable to perform ROS: Dementia    Physical Exam Updated Vital Signs BP (!) 156/69   Pulse 70   Temp 97.9 F (36.6 C) (Oral)   Resp 16   Ht 5\' 3"  (1.6 m)   Wt 54.4 kg   SpO2 100%   BMI 21.26 kg/m  Physical Exam Constitutional:      Appearance: She is well-developed.  HENT:     Head: Normocephalic and atraumatic.  Eyes:     Pupils: Pupils are equal, round, and reactive to light.  Cardiovascular:     Rate and Rhythm: Normal rate and regular rhythm.     Heart sounds: Murmur heard.  Pulmonary:     Effort: Pulmonary effort is normal. No respiratory distress.     Breath sounds: Normal breath sounds. No wheezing or rales.  Chest:     Chest wall: No tenderness.  Abdominal:     General: Bowel sounds are normal.     Palpations: Abdomen is soft.     Tenderness: There is no abdominal tenderness. There is no guarding or rebound.  Musculoskeletal:        General: Normal range of motion.     Cervical back: Normal range of motion and neck supple.     Comments: Patient has some slight swelling of her lower extremities with the left being a little bit greater than the right.  This is unchanged from her baseline per family at bedside.  Lymphadenopathy:     Cervical: No cervical adenopathy.  Skin:    General: Skin is warm and dry.     Findings: No rash.  Neurological:     Mental Status: She is alert.     Comments: Patient is awake and alert.  She is oriented to person place and year.  She moves  all extremities symmetrically without focal deficits     ED Results / Procedures / Treatments   Labs (all labs ordered are listed, but only abnormal results are displayed) Labs Reviewed  CBC WITH DIFFERENTIAL/PLATELET - Abnormal; Notable for the following components:      Result Value   RBC 3.35 (*)    Hemoglobin 11.1 (*)    HCT 34.4 (*)    MCV 102.7 (*)    Platelets 149 (*)    All other components within normal  limits  COMPREHENSIVE METABOLIC PANEL - Abnormal; Notable for the following components:   Potassium 3.4 (*)    BUN 26 (*)    Creatinine, Ser 1.15 (*)    GFR, Estimated 50 (*)    All other components within normal limits  URINALYSIS, ROUTINE W REFLEX MICROSCOPIC - Abnormal; Notable for the following components:   Protein, ur 30 (*)    All other components within normal limits    EKG EKG Interpretation  Date/Time:  Thursday July 30 2021 15:35:39 EDT Ventricular Rate:  70 PR Interval:  50 QRS Duration: 137 QT Interval:  467 QTC Calculation: 504 R Axis:   260 Text Interpretation: Ventricular-paced rhythm No further analysis attempted due to paced rhythm Baseline wander in lead(s) V6 Confirmed by Rolan Bucco [phone]) on 07/30/2021 7:37:07 PM  Radiology DG Chest 2 View  Result Date: 07/30/2021 CLINICAL DATA:  Weakness. EXAM: CHEST - 2 VIEW COMPARISON:  Chest x-ray 07/25/2021. FINDINGS: Chronic mildly prominent lung markings and bibasilar atelectasis/scar. No confluent consolidation. Cardiomegaly and pulmonary vascular congestion. No visible pneumothorax or significant pleural effusions. Cardiac rhythm maintenance device and cardiac valve replacement. IMPRESSION: Cardiomegaly and pulmonary vascular congestion without overt pulmonary edema. Electronically Signed   By: Feliberto Harts M.D.   On: 07/30/2021 16:23    Procedures Procedures    Medications Ordered in ED Medications - No data to display  ED Course/ Medical Decision Making/ A&P                            Medical Decision Making  Patient presents not eating or drinking well per family and was not taking her pills today.  She does not have any other apparent acute illnesses or change in behavior.  Chest x-ray was performed and shows no evidence of pneumonia or pulmonary edema.  This was interpreted by me and confirmed by radiology.  Her labs are nonconcerning.  Her creatinine is mildly elevated but similar to prior values on chart review.  Her EKG does not show any ischemic changes.  She does not have any focal neurodeficits that be more concerning for stroke or intracranial hemorrhage.  She is calm and interactive.  I discussed with family who is at bedside that this is likely resulting from her dementia.  I do not see any suggestions of infection.  Her urinalysis does not appear infected.  At this point she does not meet criteria for inpatient hospitalization.  I discussed with the family that they need to have a discussion about long-term plans for the patient.  They will follow-up with her primary care doctor.  Return precautions were given.  Final Clinical Impression(s) / ED Diagnoses Final diagnoses:  Dementia with other behavioral disturbance, unspecified dementia severity, unspecified dementia type [facility])    Rx / DC Orders ED Discharge Orders     None         Rolan Bucco, MD 07/30/21 2030

## 2021-08-04 ENCOUNTER — Ambulatory Visit: Payer: Medicare HMO | Attending: Internal Medicine | Admitting: Physical Therapy

## 2021-08-04 ENCOUNTER — Encounter: Payer: Self-pay | Admitting: Physical Therapy

## 2021-08-04 DIAGNOSIS — R262 Difficulty in walking, not elsewhere classified: Secondary | ICD-10-CM | POA: Insufficient documentation

## 2021-08-04 DIAGNOSIS — R296 Repeated falls: Secondary | ICD-10-CM | POA: Diagnosis not present

## 2021-08-04 DIAGNOSIS — M6281 Muscle weakness (generalized): Secondary | ICD-10-CM | POA: Diagnosis present

## 2021-08-04 DIAGNOSIS — R2689 Other abnormalities of gait and mobility: Secondary | ICD-10-CM | POA: Insufficient documentation

## 2021-08-04 DIAGNOSIS — R293 Abnormal posture: Secondary | ICD-10-CM | POA: Insufficient documentation

## 2021-08-04 NOTE — Therapy (Signed)
OUTPATIENT PHYSICAL THERAPY NEURO EVALUATION   Patient Name: Lindsey Huber MRN: [id number] DOB:Mar 01, 1945, 76 y.o., female Today's Date: 08/04/2021   PCP: Evangeline Dakin REFERRING PROVIDER: Sheliah Hatch   PT End of Session - 08/04/21 1602     Visit Number 2    Date for PT Re-Evaluation 10/07/21    PT Start Time 1600    PT Stop Time 1645    PT Time Calculation (min) 45 min    Behavior During Therapy WFL for tasks assessed/performed             Past Medical History:  Diagnosis Date   A-fib (HCC)    Anemia    Cardiomyopathy (HCC)    CHF (congestive heart failure) (HCC)    CKD (chronic kidney disease)    Dementia (HCC)    Diverticulosis    Hypertension    Lupus (HCC)    Osteoarthritis    Pulmonary fibrosis (HCC)    Vitamin D deficiency    Past Surgical History:  Procedure Laterality Date   BREAST BIOPSY Left    CARDIAC VALVE SURGERY     CARPAL TUNNEL RELEASE     COLONOSCOPY WITH PROPOFOL N/A 12/20/2020   Procedure: COLONOSCOPY WITH PROPOFOL;  Surgeon: Willis Modena, MD;  Location: [facility] ENDOSCOPY;  Service: Endoscopy;  Laterality: N/A;   HEMORRHOID SURGERY     PACEMAKER INSERTION     RIGHT/LEFT HEART CATH AND CORONARY ANGIOGRAPHY N/A 11/27/2020   Procedure: RIGHT/LEFT HEART CATH AND CORONARY ANGIOGRAPHY;  Surgeon: Dolores Patty, MD;  Location: [facility] INVASIVE CV LAB;  Service: Cardiovascular;  Laterality: N/A;   TONSILLECTOMY     Patient Active Problem List   Diagnosis Date Noted   Acute metabolic encephalopathy 07/25/2021   Metabolic encephalopathy 07/25/2021   Falls frequently 04/09/2021   Hypotension 12/14/2020   Hypokalemia 12/14/2020   Hypocalcemia 12/14/2020   Hypomagnesemia 12/14/2020   Acute GI bleeding 12/13/2020   Multinodular goiter 08/20/2020   Acquired hypothyroidism 08/20/2020   Idiopathic gout of multiple sites 06/27/2020   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   Vitamin D deficiency 05/01/2020   Mammogram abnormal 05/01/2020    Proteinuria 05/01/2020   Other bilateral secondary osteoarthritis of knee 05/01/2020   Osteoarthritis of hip 05/01/2020   HTN (hypertension) 05/01/2020   GERD without esophagitis 05/01/2020   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HCC) 05/01/2020   Cardiomyopathy (HCC) 05/01/2020   Iatrogenic hypotension 05/01/2020   Hypercoagulability due to atrial fibrillation (HCC) 05/01/2020   Systemic lupus erythematosus (HCC) 05/01/2020   Chronic heart failure with preserved ejection fraction (HCC) 05/01/2020   Paroxysmal atrial fibrillation with RVR (HCC) 05/01/2020   Protein calorie malnutrition (HCC) 05/01/2020   Chronic kidney disease (CKD), active medical management without dialysis, stage 3 (moderate) (HCC) 05/01/2020   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    Pt accompanied by: family member  PERTINENT HISTORY: she has a pacemaker, CVD, angiographs 2022, systemic lupus, CHF   PAIN:  Are you having pain? Yes: NPRS scale: 4/10 Pain location: both knees Pain description: achy Aggravating factors: trying to get up, moving, walking Relieving factors:  tylenol  PRECAUTIONS: Fall  WEIGHT BEARING RESTRICTIONS No  FALLS: Has patient fallen in last 6 months? Yes. Number of falls maybe 4  LIVING ENVIRONMENT: Lives with: lives with their family Lives in: House/apartment Stairs: No Has following equipment at home: Single point cane, Environmental consultant - 2 wheeled, Wheelchair (manual), Tour manager, and Grab bars  PLOF: Independent with basic ADLs, Independent with household mobility with device, and Independent with community mobility with device  PATIENT GOALS able to walk better and safer  OBJECTIVE:   COORDINATION: Decreased coordination with walking and balance   POSTURE: rounded shoulders, forward head, increased thoracic kyphosis, anterior pelvic tilt, and flexed trunk   LOWER EXTREMITY ROM:   WFL limited due to weakness and coordination, but she is able to walk with modified IND  LOWER EXTREMITY MMT:    MMT Right Eval Left Eval  Hip flexion 3 3  Hip extension    Hip abduction    Hip adduction    Hip internal rotation    Hip external rotation    Knee flexion 2+ 2 w/pain  Knee extension 2- w/pain 2 w/pain  Ankle dorsiflexion 3+ 3+  Ankle plantarflexion 4 4  Ankle inversion    Ankle eversion    (Blank rows = not tested)    GAIT: EVAL Gait pattern: decreased step length- Right, decreased step length- Left, decreased stance time- Right, decreased stance time- Left, decreased stride length, shuffling, ataxic, trendelenburg, decreased trunk rotation, narrow BOS, poor foot clearance- Right, and poor foot clearance- Left Distance walked: in clinic distances Assistive device utilized: Single point cane Level of assistance: CGA and Min A Comments: walks with SPC, needs CGA and minA for longer distances due to unsafe gait pattern and high fall risk  FUNCTIONAL TESTs:  EVAL 5 times sit to stand: attempted but unable to complete Timed up and go (TUG): 33.65 w/SPC and modA to stand [employer] Scale: 15/56 Functional gait  assessment: TBD   TODAY'S TREATMENT:  NuStep L1 x6 min  Hamstring curls red 2x10 LAQ 1lb 2x10 Seated march 1lb 2x10    Sit to stand x3 heavy UE use    Standing march w/ SPC 2x10    Ball squeezes 2x10   PATIENT EDUCATION: Education details: POC Person educated: Patient and Child(ren) Education method: Explanation Education comprehension: verbalized understanding   HOME EXERCISE PROGRAM: TBD  GOALS: Goals reviewed with patient? No  SHORT TERM GOALS: Target date: 09/02/21  Patient will be independent with initial HEP. Goal status: INITIAL  2.  Patient will be educated on strategies to decrease risk of falls.  Goal status: INITIAL   LONG TERM GOALS: Target date: 10/07/21   1.  Patient will be able to ambulate 600' with LRAD with good safety to access community.  Baseline: using SPC, decreased safety awareness and increased fall risk Goal status: INITIAL  2.  Patient will demonstrate improved functional LE strength as demonstrated by an increase by 1 in all muscle groups. Goal status: INITIAL  3.   Patient will score 25 on Berg Balance test to demonstrate lower risk of falls. (MCID= 8 points) .  Baseline: 15 Goal status: INITIAL  4. Patient will demonstrate decreased fall risk by scoring < 25 sec on TUG. Baseline: 33.65 Goal status: INITIAL      ASSESSMENT:  CLINICAL IMPRESSION: Pt arrives feeling fine. Pt tolerated an initial progression to TE well evident by no subjective reports or increase pain. Pt has a very difficult time with sit to stands, requiring heavy UE use and pushing her legs against table. Bilateral knee aching was reported post session. LOB x1 sith stnding marches.     OBJECTIVE IMPAIRMENTS Abnormal gait, decreased balance, decreased endurance, decreased mobility, difficulty walking, decreased strength, decreased safety awareness, improper body mechanics, postural dysfunction, and pain.   ACTIVITY LIMITATIONS carrying, lifting, bending,  sitting, standing, squatting, stairs, transfers, and locomotion level  PARTICIPATION LIMITATIONS: cleaning, laundry, shopping, and community activity  PERSONAL FACTORS Age, Behavior pattern, Past/current experiences, and 3+ comorbidities: OA, cardiovascular disease, frequent falls, CHF, SLE  are also affecting patient's functional outcome.   REHAB POTENTIAL: Fair    CLINICAL DECISION MAKING: Evolving/moderate complexity  EVALUATION COMPLEXITY: Moderate  PLAN: PT FREQUENCY: 2x/week  PT DURATION: 10 weeks  PLANNED INTERVENTIONS: Therapeutic exercises, Therapeutic activity, Neuromuscular re-education, Balance training, Gait training, Patient/Family education, Joint mobilization, Cryotherapy, Moist heat, Taping, Vasopneumatic device, Ionotophoresis 4mg /ml Dexamethasone, Manual therapy, and Re-evaluation  PLAN FOR NEXT SESSION: initiate HEP, work on transfers,    Scot Jun, PTA 08/04/2021, 4:04 PM

## 2021-08-05 ENCOUNTER — Ambulatory Visit: Payer: Medicare HMO

## 2021-08-05 DIAGNOSIS — M6281 Muscle weakness (generalized): Secondary | ICD-10-CM

## 2021-08-05 DIAGNOSIS — R296 Repeated falls: Secondary | ICD-10-CM | POA: Diagnosis not present

## 2021-08-05 DIAGNOSIS — R262 Difficulty in walking, not elsewhere classified: Secondary | ICD-10-CM

## 2021-08-05 DIAGNOSIS — R2689 Other abnormalities of gait and mobility: Secondary | ICD-10-CM

## 2021-08-05 DIAGNOSIS — R293 Abnormal posture: Secondary | ICD-10-CM

## 2021-08-05 NOTE — Therapy (Signed)
OUTPATIENT PHYSICAL THERAPY NEURO EVALUATION   Patient Name: Lindsey Huber MRN: [id number] DOB:[birth year], 76 y.o., female Today's Date: 08/05/2021   PCP: Evangeline Dakin REFERRING PROVIDER: Sheliah Hatch   PT End of Session - 08/05/21 1704     Visit Number 3    Date for PT Re-Evaluation 10/07/21    PT Start Time 1700    PT Stop Time 1744    PT Time Calculation (min) 44 min    Activity Tolerance Patient tolerated treatment well;Patient limited by fatigue    Behavior During Therapy [facility] for tasks assessed/performed              Past Medical History:  Diagnosis Date   A-fib (HCC)    Anemia    Cardiomyopathy (HCC)    CHF (congestive heart failure) (HCC)    CKD (chronic kidney disease)    Dementia (HCC)    Diverticulosis    Hypertension    Lupus (HCC)    Osteoarthritis    Pulmonary fibrosis (HCC)    Vitamin D deficiency    Past Surgical History:  Procedure Laterality Date   BREAST BIOPSY Left    CARDIAC VALVE SURGERY     CARPAL TUNNEL RELEASE     COLONOSCOPY WITH PROPOFOL N/A 12/20/2020   Procedure: COLONOSCOPY WITH PROPOFOL;  Surgeon: Willis Modena, MD;  Location: [facility] ENDOSCOPY;  Service: Endoscopy;  Laterality: N/A;   HEMORRHOID SURGERY     PACEMAKER INSERTION     RIGHT/LEFT HEART CATH AND CORONARY ANGIOGRAPHY N/A 11/27/2020   Procedure: RIGHT/LEFT HEART CATH AND CORONARY ANGIOGRAPHY;  Surgeon: Dolores Patty, MD;  Location: [facility] INVASIVE CV LAB;  Service: Cardiovascular;  Laterality: N/A;   TONSILLECTOMY     Patient Active Problem List   Diagnosis Date Noted   Acute metabolic encephalopathy 07/25/2021   Metabolic encephalopathy 07/25/2021   Falls frequently 04/09/2021   Hypotension 12/14/2020   Hypokalemia 12/14/2020   Hypocalcemia 12/14/2020   Hypomagnesemia 12/14/2020   Acute GI bleeding 12/13/2020   Multinodular goiter 08/20/2020   Acquired hypothyroidism 08/20/2020   Idiopathic gout of multiple sites 06/27/2020   Tricuspid regurgitation  05/01/2020   Vitamin D deficiency 05/01/2020   Mammogram abnormal 05/01/2020   Proteinuria 05/01/2020   Other bilateral secondary osteoarthritis of knee 05/01/2020   Osteoarthritis of hip 05/01/2020   HTN (hypertension) 05/01/2020   GERD without esophagitis 05/01/2020   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HCC) 05/01/2020   Cardiomyopathy (HCC) 05/01/2020   Iatrogenic hypotension 05/01/2020   Hypercoagulability due to atrial fibrillation (HCC) 05/01/2020   Systemic lupus erythematosus (HCC) 05/01/2020   Chronic heart failure with preserved ejection fraction (HCC) 05/01/2020   Paroxysmal atrial fibrillation with RVR (HCC) 05/01/2020   Protein calorie malnutrition (HCC) 05/01/2020   Chronic kidney disease (CKD), active medical management without dialysis, stage 3 (moderate) (HCC) 05/01/2020   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HCC) 05/01/2020   Non-rheumatic atrial fibrillation (HCC) 05/01/2020   Atherosclerosis of abdominal aorta (HCC) 05/01/2020   Diverticulosis of colon 05/01/2020   Cholelithiasis 05/01/2020   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   Anemia of chronic disease 05/01/2020    ONSET DATE: 06/18/21  REFERRING DIAG: R29.6  THERAPY DIAG:  Repeated falls  Muscle weakness (generalized)  Other abnormalities of gait and mobility  Difficulty in walking, not elsewhere classified  Abnormal posture  Rationale for Evaluation and Treatment Rehabilitation  SUBJECTIVE:  SUBJECTIVE STATEMENT: Knee still hurting, I was tired after session yesterday.   Pt accompanied by: family member  PERTINENT HISTORY: she has a pacemaker, CVD, angiographs 2022, systemic lupus, CHF   PAIN:  Are you having pain? Yes: NPRS scale: 4/10 Pain location: both  knees Pain description: achy Aggravating factors: trying to get up, moving, walking Relieving factors: tylenol  PRECAUTIONS: Fall  WEIGHT BEARING RESTRICTIONS No  FALLS: Has patient fallen in last 6 months? Yes. Number of falls maybe 4  LIVING ENVIRONMENT: Lives with: lives with their family Lives in: House/apartment Stairs: No Has following equipment at home: Single point cane, Environmental consultant - 2 wheeled, Wheelchair (manual), Tour manager, and Grab bars  PLOF: Independent with basic ADLs, Independent with household mobility with device, and Independent with community mobility with device  PATIENT GOALS able to walk better and safer  OBJECTIVE:   COORDINATION: Decreased coordination with walking and balance   POSTURE: rounded shoulders, forward head, increased thoracic kyphosis, anterior pelvic tilt, and flexed trunk   LOWER EXTREMITY ROM:   WFL limited due to weakness and coordination, but she is able to walk with modified IND  LOWER EXTREMITY MMT:    MMT Right Eval Left Eval  Hip flexion 3 3  Hip extension    Hip abduction    Hip adduction    Hip internal rotation    Hip external rotation    Knee flexion 2+ 2 w/pain  Knee extension 2- w/pain 2 w/pain  Ankle dorsiflexion 3+ 3+  Ankle plantarflexion 4 4  Ankle inversion    Ankle eversion    (Blank rows = not tested)    GAIT: EVAL Gait pattern: decreased step length- Right, decreased step length- Left, decreased stance time- Right, decreased stance time- Left, decreased stride length, shuffling, ataxic, trendelenburg, decreased trunk rotation, narrow BOS, poor foot clearance- Right, and poor foot clearance- Left Distance walked: in clinic distances Assistive device utilized: Single point cane Level of assistance: CGA and Min A Comments: walks with SPC, needs CGA and minA for longer distances due to unsafe gait pattern and high fall risk  FUNCTIONAL TESTs:  EVAL 5 times sit to stand: attempted but unable to  complete Timed up and go (TUG): 33.65 w/SPC and modA to stand [employer] Scale: 15/56 Functional gait assessment: TBD   TODAY'S TREATMENT:  08/05/21 Nustep L1 x57mins HS curls redTB 2x10 Ankle redTB x10 each way both sides LAQ 1# 2x10 Seated marches 1# 2x10 each side Seated OHP 2x10 Seated leg press against fitter, low resistance 2x10 each side     Heel raises 2x12 with 2HHA    Seated banded abd greenTB 2x10   Ball squeezes 2x10, 3s holds  08/04/21 NuStep L1 x6 min  Hamstring curls red 2x10 LAQ 1lb 2x10 Seated march 1lb 2x10    Sit to stand x3 heavy UE use    Standing march w/ SPC 2x10    Ball squeezes 2x10   PATIENT EDUCATION: Education details: POC Person educated: Patient and Child(ren) Education method: Explanation Education comprehension: verbalized understanding   HOME EXERCISE PROGRAM: TBD  GOALS: Goals reviewed with patient? No  SHORT TERM GOALS: Target date: 09/02/21  Patient will be independent with initial HEP. Goal status: INITIAL  2.  Patient will be educated on strategies to decrease risk of falls.  Goal status: INITIAL   LONG TERM GOALS: Target date: 10/07/21   1.  Patient will be able to ambulate 600' with LRAD with good safety to access community.  Baseline:  using SPC, decreased safety awareness and increased fall risk Goal status: INITIAL  2.  Patient will demonstrate improved functional LE strength as demonstrated by an increase by 1 in all muscle groups. Goal status: INITIAL  3.   Patient will score 25 on Berg Balance test to demonstrate lower risk of falls. (MCID= 8 points) .  Baseline: 15 Goal status: INITIAL  4. Patient will demonstrate decreased fall risk by scoring < 25 sec on TUG. Baseline: 33.65 Goal status: INITIAL      ASSESSMENT:  CLINICAL IMPRESSION: Pt arrives feeling sore, has increased swelling in bilateral feet. Daughter states that that happens at times and patient is on diuretics. We continued with low level  lower extremity strengthening from yesterday and added in a few new exercises. Attempted STS from an elevated mat table but she still has difficulty without heavy UE use. Continue working on strength, balance, and transfers.    OBJECTIVE IMPAIRMENTS Abnormal gait, decreased balance, decreased endurance, decreased mobility, difficulty walking, decreased strength, decreased safety awareness, improper body mechanics, postural dysfunction, and pain.   ACTIVITY LIMITATIONS carrying, lifting, bending, sitting, standing, squatting, stairs, transfers, and locomotion level  PARTICIPATION LIMITATIONS: cleaning, laundry, shopping, and community activity  PERSONAL FACTORS Age, Behavior pattern, Past/current experiences, and 3+ comorbidities: OA, cardiovascular disease, frequent falls, CHF, SLE  are also affecting patient's functional outcome.   REHAB POTENTIAL: Fair    CLINICAL DECISION MAKING: Evolving/moderate complexity  EVALUATION COMPLEXITY: Moderate  PLAN: PT FREQUENCY: 2x/week  PT DURATION: 10 weeks  PLANNED INTERVENTIONS: Therapeutic exercises, Therapeutic activity, Neuromuscular re-education, Balance training, Gait training, Patient/Family education, Joint mobilization, Cryotherapy, Moist heat, Taping, Vasopneumatic device, Ionotophoresis 4mg /ml Dexamethasone, Manual therapy, and Re-evaluation  PLAN FOR NEXT SESSION: initiate HEP, work on transfers, mini squats, LE strengthening   [place], PT 08/05/2021, 5:47 PM

## 2021-08-10 ENCOUNTER — Ambulatory Visit: Payer: Medicare HMO

## 2021-08-10 DIAGNOSIS — R262 Difficulty in walking, not elsewhere classified: Secondary | ICD-10-CM

## 2021-08-10 DIAGNOSIS — R293 Abnormal posture: Secondary | ICD-10-CM

## 2021-08-10 DIAGNOSIS — R296 Repeated falls: Secondary | ICD-10-CM | POA: Diagnosis not present

## 2021-08-10 DIAGNOSIS — M6281 Muscle weakness (generalized): Secondary | ICD-10-CM

## 2021-08-10 DIAGNOSIS — R2689 Other abnormalities of gait and mobility: Secondary | ICD-10-CM

## 2021-08-10 NOTE — Therapy (Signed)
OUTPATIENT PHYSICAL THERAPY NEURO TREATMENT   Patient Name: Lindsey Huber MRN: [id number] DOB:11/21/1945, 76 y.o., female Today's Date: 08/10/2021   PCP: Fredda Hammed REFERRING PROVIDER: Vernelle Emerald   PT End of Session - 08/10/21 1703     Visit Number 4    Date for PT Re-Evaluation 10/07/21    PT Start Time 1701    PT Stop Time [id number]    PT Time Calculation (min) 44 min    Activity Tolerance Patient tolerated treatment well;Patient limited by fatigue    Behavior During Therapy WFL for tasks assessed/performed               Past Medical History:  Diagnosis Date   A-fib ([place])    Anemia    Cardiomyopathy ([place])    CHF (congestive heart failure) ([place])    CKD (chronic kidney disease)    Dementia ([place])    Diverticulosis    Hypertension    Lupus ([place])    Osteoarthritis    Pulmonary fibrosis ([place])    Vitamin D deficiency    Past Surgical History:  Procedure Laterality Date   BREAST BIOPSY Left    CARDIAC VALVE SURGERY     CARPAL TUNNEL RELEASE     COLONOSCOPY WITH PROPOFOL N/A 12/20/2020   Procedure: COLONOSCOPY WITH PROPOFOL;  Surgeon: Arta Silence, MD;  Location: [facility];  Service: Endoscopy;  Laterality: N/A;   HEMORRHOID SURGERY     PACEMAKER INSERTION     RIGHT/LEFT HEART CATH AND CORONARY ANGIOGRAPHY N/A 11/27/2020   Procedure: RIGHT/LEFT HEART CATH AND CORONARY ANGIOGRAPHY;  Surgeon: Jolaine Artist, MD;  Location: [place] CV LAB;  Service: Cardiovascular;  Laterality: N/A;   TONSILLECTOMY     Patient Active Problem List   Diagnosis Date Noted   Acute metabolic encephalopathy 123456   Metabolic encephalopathy 123456   Falls frequently 04/09/2021   Hypotension 12/14/2020   Hypokalemia 12/14/2020   Hypocalcemia 12/14/2020   Hypomagnesemia 12/14/2020   Acute GI bleeding 12/13/2020   Multinodular goiter 08/20/2020   Acquired hypothyroidism 08/20/2020   Idiopathic gout of multiple sites 06/27/2020   Tricuspid  regurgitation 05/01/2020   Vitamin D deficiency 05/01/2020   Mammogram abnormal 05/01/2020   Proteinuria 05/01/2020   Other bilateral secondary osteoarthritis of knee 05/01/2020   Osteoarthritis of hip 05/01/2020   HTN (hypertension) 05/01/2020   GERD without esophagitis 05/01/2020   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[place]) 05/01/2020   Cardiomyopathy ([place]) 05/01/2020   Iatrogenic hypotension 05/01/2020   Hypercoagulability due to atrial fibrillation ([place]) 05/01/2020   Systemic lupus erythematosus ([place]) 05/01/2020   Chronic heart failure with preserved ejection fraction ([place]) 05/01/2020   Paroxysmal atrial fibrillation with RVR (HCC) 05/01/2020   Protein calorie malnutrition ([place]) 05/01/2020   Chronic kidney disease (CKD), active medical management without dialysis, stage 3 (moderate) ([place]) 05/01/2020   Secondary hyperaldosteronism ([place]) 05/01/2020   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[place]) 05/01/2020   Non-rheumatic atrial fibrillation ([place]) 05/01/2020   Atherosclerosis of abdominal aorta ([place]) 05/01/2020   Diverticulosis of colon 05/01/2020   Cholelithiasis 05/01/2020   Secondary adrenal insufficiency ([place]) 05/01/2020   Anemia of chronic disease 05/01/2020    ONSET DATE: 06/18/21  REFERRING DIAG: R29.6  THERAPY DIAG:  Muscle weakness (generalized)  Other abnormalities of gait and mobility  Difficulty in walking, not elsewhere classified  Repeated falls  Abnormal posture  Rationale for Evaluation and Treatment Rehabilitation  SUBJECTIVE:  SUBJECTIVE STATEMENT: I am sore and stiff because I did a lot of walking this weekend.   Pt accompanied by: family member  PERTINENT HISTORY: she has a pacemaker, CVD, angiographs 2022, systemic lupus, CHF   PAIN:  Are you having pain? Yes: NPRS scale:  6/10 Pain location: both knees Pain description: achy Aggravating factors: trying to get up, moving, walking Relieving factors: tylenol  PRECAUTIONS: Fall  WEIGHT BEARING RESTRICTIONS No  FALLS: Has patient fallen in last 6 months? Yes. Number of falls maybe 4  LIVING ENVIRONMENT: Lives with: lives with their family Lives in: House/apartment Stairs: No Has following equipment at home: Single point cane, Environmental consultant - 2 wheeled, Wheelchair (manual), Tour manager, and Grab bars  PLOF: Independent with basic ADLs, Independent with household mobility with device, and Independent with community mobility with device  PATIENT GOALS able to walk better and safer  OBJECTIVE:   COORDINATION: Decreased coordination with walking and balance   POSTURE: rounded shoulders, forward head, increased thoracic kyphosis, anterior pelvic tilt, and flexed trunk   LOWER EXTREMITY ROM:   WFL limited due to weakness and coordination, but she is able to walk with modified IND  LOWER EXTREMITY MMT:    MMT Right Eval Left Eval  Hip flexion 3 3  Hip extension    Hip abduction    Hip adduction    Hip internal rotation    Hip external rotation    Knee flexion 2+ 2 w/pain  Knee extension 2- w/pain 2 w/pain  Ankle dorsiflexion 3+ 3+  Ankle plantarflexion 4 4  Ankle inversion    Ankle eversion    (Blank rows = not tested)    GAIT: EVAL Gait pattern: decreased step length- Right, decreased step length- Left, decreased stance time- Right, decreased stance time- Left, decreased stride length, shuffling, ataxic, trendelenburg, decreased trunk rotation, narrow BOS, poor foot clearance- Right, and poor foot clearance- Left Distance walked: in clinic distances Assistive device utilized: Single point cane Level of assistance: CGA and Min A Comments: walks with SPC, needs CGA and minA for longer distances due to unsafe gait pattern and high fall risk  FUNCTIONAL TESTs:  EVAL 5 times sit to stand:  attempted but unable to complete Timed up and go (TUG): 33.65 w/SPC and modA to stand Berg Balance Scale: 15/56 Functional gait assessment: TBD   TODAY'S TREATMENT:  08/10/21 Nustep L1x67mins  Bike L1 x61mins  Standing marches 2# LAQ 2# 2x10 2 way hip 2# 2x10 Heel raises 2# 2x12   08/05/21 Nustep L1 x48mins HS curls redTB 2x10 Ankle redTB x10 each way both sides LAQ 1# 2x10 Seated marches 1# 2x10 each side Seated OHP 2x10 Seated leg press against fitter, low resistance 2x10 each side     Heel raises 2x12 with 2HHA    Seated banded abd greenTB 2x10   Ball squeezes 2x10, 3s holds  08/04/21 NuStep L1 x6 min  Hamstring curls red 2x10 LAQ 1lb 2x10 Seated march 1lb 2x10    Sit to stand x3 heavy UE use    Standing march w/ SPC 2x10    Ball squeezes 2x10   PATIENT EDUCATION: Education details: POC Person educated: Patient and Child(ren) Education method: Explanation Education comprehension: verbalized understanding   HOME EXERCISE PROGRAM: TBD  GOALS: Goals reviewed with patient? No  SHORT TERM GOALS: Target date: 09/02/21  Patient will be independent with initial HEP. Goal status: INITIAL  2.  Patient will be educated on strategies to decrease risk of falls.  Goal status:  INITIAL   LONG TERM GOALS: Target date: 10/07/21   1.  Patient will be able to ambulate 600' with LRAD with good safety to access community.  Baseline: using SPC, decreased safety awareness and increased fall risk Goal status: INITIAL  2.  Patient will demonstrate improved functional LE strength as demonstrated by an increase by 1 in all muscle groups. Goal status: INITIAL  3.   Patient will score 25 on Berg Balance test to demonstrate lower risk of falls. (MCID= 8 points) .  Baseline: 15 Goal status: INITIAL  4. Patient will demonstrate decreased fall risk by scoring < 25 sec on TUG. Baseline: 33.65 Goal status: INITIAL    ASSESSMENT:  CLINICAL IMPRESSION: Pt arrives feeling sore,  due to increased activity and has been practicing leg exercises at home. We continued with lower extremity strengthening functional strengthening of hips and knees. Still having lots of difficulty with STS. Patient needed a bathroom break during session, that took up ~76mins.    OBJECTIVE IMPAIRMENTS Abnormal gait, decreased balance, decreased endurance, decreased mobility, difficulty walking, decreased strength, decreased safety awareness, improper body mechanics, postural dysfunction, and pain.   ACTIVITY LIMITATIONS carrying, lifting, bending, sitting, standing, squatting, stairs, transfers, and locomotion level  PARTICIPATION LIMITATIONS: cleaning, laundry, shopping, and community activity  PERSONAL FACTORS Age, Behavior pattern, Past/current experiences, and 3+ comorbidities: OA, cardiovascular disease, frequent falls, CHF, SLE  are also affecting patient's functional outcome.   REHAB POTENTIAL: Fair    CLINICAL DECISION MAKING: Evolving/moderate complexity  EVALUATION COMPLEXITY: Moderate  PLAN: PT FREQUENCY: 2x/week  PT DURATION: 10 weeks  PLANNED INTERVENTIONS: Therapeutic exercises, Therapeutic activity, Neuromuscular re-education, Balance training, Gait training, Patient/Family education, Joint mobilization, Cryotherapy, Moist heat, Taping, Vasopneumatic device, Ionotophoresis 4mg /ml Dexamethasone, Manual therapy, and Re-evaluation  PLAN FOR NEXT SESSION: initiate HEP, work on transfers, mini squats, LE strengthening   [place], PT 08/10/2021, 5:46 PM

## 2021-08-12 ENCOUNTER — Ambulatory Visit: Payer: Medicare HMO

## 2021-08-12 ENCOUNTER — Ambulatory Visit (INDEPENDENT_AMBULATORY_CARE_PROVIDER_SITE_OTHER): Payer: Medicare HMO | Admitting: Psychology

## 2021-08-12 ENCOUNTER — Encounter: Payer: Self-pay | Admitting: Psychology

## 2021-08-12 DIAGNOSIS — F22 Delusional disorders: Secondary | ICD-10-CM | POA: Insufficient documentation

## 2021-08-12 DIAGNOSIS — R4189 Other symptoms and signs involving cognitive functions and awareness: Secondary | ICD-10-CM

## 2021-08-12 DIAGNOSIS — E162 Hypoglycemia, unspecified: Secondary | ICD-10-CM | POA: Insufficient documentation

## 2021-08-12 DIAGNOSIS — F03A2 Unspecified dementia, mild, with psychotic disturbance: Secondary | ICD-10-CM

## 2021-08-12 DIAGNOSIS — I6781 Acute cerebrovascular insufficiency: Secondary | ICD-10-CM | POA: Diagnosis not present

## 2021-08-12 DIAGNOSIS — G459 Transient cerebral ischemic attack, unspecified: Secondary | ICD-10-CM | POA: Insufficient documentation

## 2021-08-12 DIAGNOSIS — R293 Abnormal posture: Secondary | ICD-10-CM

## 2021-08-12 DIAGNOSIS — K220 Achalasia of cardia: Secondary | ICD-10-CM | POA: Insufficient documentation

## 2021-08-12 DIAGNOSIS — J309 Allergic rhinitis, unspecified: Secondary | ICD-10-CM | POA: Insufficient documentation

## 2021-08-12 DIAGNOSIS — M1A00X0 Idiopathic chronic gout, unspecified site, without tophus (tophi): Secondary | ICD-10-CM | POA: Insufficient documentation

## 2021-08-12 DIAGNOSIS — M85852 Other specified disorders of bone density and structure, left thigh: Secondary | ICD-10-CM | POA: Insufficient documentation

## 2021-08-12 DIAGNOSIS — M069 Rheumatoid arthritis, unspecified: Secondary | ICD-10-CM | POA: Insufficient documentation

## 2021-08-12 DIAGNOSIS — R2689 Other abnormalities of gait and mobility: Secondary | ICD-10-CM

## 2021-08-12 DIAGNOSIS — R296 Repeated falls: Secondary | ICD-10-CM

## 2021-08-12 DIAGNOSIS — I7300 Raynaud's syndrome without gangrene: Secondary | ICD-10-CM | POA: Insufficient documentation

## 2021-08-12 DIAGNOSIS — K5901 Slow transit constipation: Secondary | ICD-10-CM | POA: Insufficient documentation

## 2021-08-12 DIAGNOSIS — D6859 Other primary thrombophilia: Secondary | ICD-10-CM | POA: Insufficient documentation

## 2021-08-12 DIAGNOSIS — Z7901 Long term (current) use of anticoagulants: Secondary | ICD-10-CM | POA: Insufficient documentation

## 2021-08-12 DIAGNOSIS — M6281 Muscle weakness (generalized): Secondary | ICD-10-CM

## 2021-08-12 DIAGNOSIS — R1312 Dysphagia, oropharyngeal phase: Secondary | ICD-10-CM | POA: Insufficient documentation

## 2021-08-12 DIAGNOSIS — E441 Mild protein-calorie malnutrition: Secondary | ICD-10-CM | POA: Insufficient documentation

## 2021-08-12 DIAGNOSIS — F03A0 Unspecified dementia, mild, without behavioral disturbance, psychotic disturbance, mood disturbance, and anxiety: Secondary | ICD-10-CM | POA: Insufficient documentation

## 2021-08-12 DIAGNOSIS — S52521G Torus fracture of lower end of right radius, subsequent encounter for fracture with delayed healing: Secondary | ICD-10-CM | POA: Insufficient documentation

## 2021-08-12 DIAGNOSIS — R040 Epistaxis: Secondary | ICD-10-CM | POA: Insufficient documentation

## 2021-08-12 DIAGNOSIS — D509 Iron deficiency anemia, unspecified: Secondary | ICD-10-CM | POA: Insufficient documentation

## 2021-08-12 DIAGNOSIS — Z8673 Personal history of transient ischemic attack (TIA), and cerebral infarction without residual deficits: Secondary | ICD-10-CM | POA: Insufficient documentation

## 2021-08-12 DIAGNOSIS — K922 Gastrointestinal hemorrhage, unspecified: Secondary | ICD-10-CM | POA: Insufficient documentation

## 2021-08-12 DIAGNOSIS — R262 Difficulty in walking, not elsewhere classified: Secondary | ICD-10-CM

## 2021-08-12 HISTORY — DX: Torus fracture of lower end of right radius, subsequent encounter for fracture with delayed healing: S52.521G

## 2021-08-12 HISTORY — DX: Unspecified dementia, mild, without behavioral disturbance, psychotic disturbance, mood disturbance, and anxiety: F03.A0

## 2021-08-12 NOTE — Progress Notes (Signed)
   Psychometrician Note   Cognitive testing was administered to [employer] by Shan Levans, B.S. (psychometrist) under the supervision of Dr. Newman Nickels, Ph.D., licensed psychologist on 08/12/2021. Ms. Stankowski did not appear overtly distressed by the testing session per behavioral observation or responses across self-report questionnaires. Rest breaks were offered.    The battery of tests administered was selected by Dr. Newman Nickels, Ph.D. with consideration to Ms. Erway's current level of functioning, the nature of her symptoms, emotional and behavioral responses during interview, level of literacy, observed level of motivation/effort, and the nature of the referral question. This battery was communicated to the psychometrist. Communication between Dr. Newman Nickels, Ph.D. and the psychometrist was ongoing throughout the evaluation and Dr. Newman Nickels, Ph.D. was immediately accessible at all times. Dr. Newman Nickels, Ph.D. provided supervision to the psychometrist on the date of this service to the extent necessary to assure the quality of all services provided.    Shundra Wirsing will return within approximately 1-2 weeks for an interactive feedback session with Dr. Milbert Coulter at which time her test performances, clinical impressions, and treatment recommendations will be reviewed in detail. Ms. Gamino understands she can contact our office should she require our assistance before this time.  A total of 140 minutes of billable time were spent face-to-face with Ms. Westra by the psychometrist. This includes both test administration and scoring time. Billing for these services is reflected in the clinical report generated by Dr. Newman Nickels, Ph.D.  This note reflects time spent with the psychometrician and does not include test scores or any clinical interpretations made by Dr. Milbert Coulter. The full report will follow in a separate note.

## 2021-08-12 NOTE — Therapy (Signed)
OUTPATIENT PHYSICAL THERAPY NEURO TREATMENT   Patient Name: Lindsey Huber MRN: [id number] DOB:02-02-1945, 76 y.o., female Today's Date: 08/12/2021   PCP: Fredda Hammed REFERRING PROVIDER: Vernelle Emerald   PT End of Session - 08/12/21 1658     Visit Number 5    Date for PT Re-Evaluation 10/07/21    PT Start Time 1700    PT Stop Time 1745    PT Time Calculation (min) 45 min    Equipment Utilized During Treatment Gait belt    Activity Tolerance Patient tolerated treatment well;Patient limited by fatigue    Behavior During Therapy [facility] for tasks assessed/performed                Past Medical History:  Diagnosis Date   Achalasia    Acquired hypothyroidism 123456   Acute metabolic encephalopathy 123456   AICD (automatic cardioverter/defibrillator) present 11/14/2019   Allergic rhinitis    Aortic valve disorder 03/26/2002   Atherosclerosis of abdominal aorta 05/01/2020   Cardiomyopathy    CHB (complete heart block) 08/18/2017   CHF (congestive heart failure)    Cholelithiasis without obstruction 05/01/2020   Chronic gouty arthritis    Chronic heart failure with preserved ejection fraction 05/01/2020   Chronic kidney disease (CKD), active medical management without dialysis, stage 3 (moderate) 05/01/2020   Closed torus fracture of distal end of right radius with delayed healing 08/12/2021   Delusional thoughts    Diverticulosis of colon 05/01/2020   Epistaxis    Essential (primary) hypertension 08/18/2017   Gastroesophageal reflux disease 05/01/2020   Gastrointestinal hemorrhage    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 11/14/2019   Hypercoagulability due to atrial fibrillation 05/01/2020   Hyperlipidemia 11/14/2019   Hypocalcemia 12/14/2020   Hypoglycemia    Hypokalemia 12/14/2020   Hypomagnesemia 12/14/2020   Hypotension 12/14/2020   Idiopathic pulmonary fibrosis 05/01/2020   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   Iron  deficiency anemia    Long term (current) use of anticoagulants    Malnutrition of mild degree [name]: 75% to less than 90% of standard weight)    Mild dementia 08/12/2021   Mitral and aortic incompetence 11/14/2019   Non-rheumatic atrial fibrillation 05/01/2020   Non-toxic multinodular goiter 08/20/2020   NSVT (nonsustained ventricular tachycardia) 08/18/2017   Oropharyngeal dysphagia    Osteoarthritis of hip 05/01/2020   Osteoarthritis of knee 05/01/2020   Osteopenia of neck of left femur    Pain of left hip joint 12/12/2017   Proteinuria 05/01/2020   Raynaud's disease    Recurrent falls 04/09/2021   Rheumatoid arthritis    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   Septic shock 03/10/2020   Slow transit constipation    Systemic lupus erythematosus 05/01/2020   Thrombophilia    Transient ischemic attack    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   Vitamin D deficiency    Past Surgical History:  Procedure Laterality Date   BREAST BIOPSY Left    CARDIAC VALVE SURGERY     CARPAL TUNNEL RELEASE     COLONOSCOPY WITH PROPOFOL N/A 12/20/2020   Procedure: COLONOSCOPY WITH PROPOFOL;  Surgeon: Arta Silence, MD;  Location: [facility] ENDOSCOPY;  Service: Endoscopy;  Laterality: N/A;   HEMORRHOID SURGERY     PACEMAKER INSERTION     RIGHT/LEFT HEART CATH AND CORONARY ANGIOGRAPHY N/A 11/27/2020   Procedure: RIGHT/LEFT HEART CATH AND CORONARY ANGIOGRAPHY;  Surgeon: Jolaine Artist, MD;  Location: [place] [facility];  Service: Cardiovascular;  Laterality: N/A;   TONSILLECTOMY  Patient Active Problem List   Diagnosis Date Noted   Mild dementia 08/12/2021   Allergic rhinitis    Chronic gouty arthritis    Epistaxis    Hypoglycemia    Iron deficiency anemia    Long term (current) use of anticoagulants    Malnutrition of mild degree [name]: 75% to less than 90% of standard weight)    Oropharyngeal dysphagia    Osteopenia of neck of left femur     Raynaud's disease    Rheumatoid arthritis    Transient ischemic attack    Slow transit constipation    Thrombophilia    Achalasia    Delusional thoughts    Recurrent falls 04/09/2021   Hypotension 12/14/2020   Hypokalemia 12/14/2020   Hypocalcemia 12/14/2020   Hypomagnesemia 12/14/2020   Non-toxic multinodular goiter 08/20/2020   Acquired hypothyroidism 08/20/2020   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   Vitamin D deficiency 05/01/2020   Proteinuria 05/01/2020   Osteoarthritis of knee 05/01/2020   Osteoarthritis of hip 05/01/2020   Gastroesophageal reflux disease 05/01/2020   Idiopathic pulmonary fibrosis 05/01/2020   Cardiomyopathy 05/01/2020   Hypercoagulability due to atrial fibrillation 05/01/2020   Systemic lupus erythematosus 05/01/2020   Chronic heart failure with preserved ejection fraction 05/01/2020   Chronic kidney disease (CKD), active medical management without dialysis, stage 3 (moderate) 05/01/2020   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   Non-rheumatic atrial fibrillation 05/01/2020   Atherosclerosis of abdominal aorta 05/01/2020   Diverticulosis of colon 05/01/2020   Cholelithiasis without obstruction 05/01/2020   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   AICD (automatic cardioverter/defibrillator) present 11/14/2019   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 11/14/2019   Mitral and aortic incompetence 11/14/2019   Hyperlipidemia 11/14/2019   Pain of left hip joint 12/12/2017   Essential (primary) hypertension 08/18/2017   CHB (complete heart block) 08/18/2017   NSVT (nonsustained ventricular tachycardia) 08/18/2017   Aortic valve disorder 03/26/2002    ONSET DATE: 06/18/21  REFERRING DIAG: R29.6  THERAPY DIAG:  Muscle weakness (generalized)  Other abnormalities of gait and mobility  Difficulty in walking, not elsewhere classified  Repeated falls  Abnormal posture  Rationale for  Evaluation and Treatment Rehabilitation  SUBJECTIVE:                                                                                                                                                                                              SUBJECTIVE STATEMENT: Patient is staggering a bit today and tried to walk without cane. Was educated to use it for stability. Having slight pain in R knee.   Pt accompanied  by: family member  PERTINENT HISTORY: she has a pacemaker, CVD, angiographs 2022, systemic lupus, CHF   PAIN:  Are you having pain? Yes: NPRS scale: 6/10 Pain location: both knees Pain description: achy Aggravating factors: trying to get up, moving, walking Relieving factors: tylenol  PRECAUTIONS: Fall  WEIGHT BEARING RESTRICTIONS No  FALLS: Has patient fallen in last 6 months? Yes. Number of falls maybe 4  LIVING ENVIRONMENT: Lives with: lives with their family Lives in: House/apartment Stairs: No Has following equipment at home: Single point cane, Environmental consultant - 2 wheeled, Wheelchair (manual), Tour manager, and Grab bars  PLOF: Independent with basic ADLs, Independent with household mobility with device, and Independent with community mobility with device  PATIENT GOALS able to walk better and safer  OBJECTIVE:   COORDINATION: Decreased coordination with walking and balance   POSTURE: rounded shoulders, forward head, increased thoracic kyphosis, anterior pelvic tilt, and flexed trunk   LOWER EXTREMITY ROM:   WFL limited due to weakness and coordination, but she is able to walk with modified IND  LOWER EXTREMITY MMT:    MMT Right Eval Left Eval  Hip flexion 3 3  Hip extension    Hip abduction    Hip adduction    Hip internal rotation    Hip external rotation    Knee flexion 2+ 2 w/pain  Knee extension 2- w/pain 2 w/pain  Ankle dorsiflexion 3+ 3+  Ankle plantarflexion 4 4  Ankle inversion    Ankle eversion    (Blank rows = not  tested)    GAIT: EVAL Gait pattern: decreased step length- Right, decreased step length- Left, decreased stance time- Right, decreased stance time- Left, decreased stride length, shuffling, ataxic, trendelenburg, decreased trunk rotation, narrow BOS, poor foot clearance- Right, and poor foot clearance- Left Distance walked: in clinic distances Assistive device utilized: Single point cane Level of assistance: CGA and Min A Comments: walks with SPC, needs CGA and minA for longer distances due to unsafe gait pattern and high fall risk  FUNCTIONAL TESTs:  EVAL 5 times sit to stand: attempted but unable to complete Timed up and go (TUG): 33.65 w/SPC and modA to stand [employer] Scale: 15/56 Functional gait assessment: TBD   TODAY'S TREATMENT:  08/12/21 Nustep L1x6 mins  Fitter 2x10 bilat  Heel taps 4" with min-modA HS curls greenTB  LAQ 2.5# 3 x10 Standing marches 2.5# 2x10 SLR 2x10 Bridges 2x10 Banded fall outs redTB x10 bilat   08/10/21 Nustep L1x94mins  Bike L1 x20mins  Standing marches 2# LAQ 2# 2x10 2 way hip 2# 2x10 Heel raises 2# 2x12   08/05/21 Nustep L1 x79mins HS curls redTB 2x10 Ankle redTB x10 each way both sides LAQ 1# 2x10 Seated marches 1# 2x10 each side Seated OHP 2x10 Seated leg press against fitter, low resistance 2x10 each side     Heel raises 2x12 with 2HHA    Seated banded abd greenTB 2x10   Ball squeezes 2x10, 3s holds  08/04/21 NuStep L1 x6 min  Hamstring curls red 2x10 LAQ 1lb 2x10 Seated march 1lb 2x10    Sit to stand x3 heavy UE use    Standing march w/ SPC 2x10    Ball squeezes 2x10   PATIENT EDUCATION: Education details: POC Person educated: Patient and Child(ren) Education method: Explanation Education comprehension: verbalized understanding   HOME EXERCISE PROGRAM: TBD  GOALS: Goals reviewed with patient? No  SHORT TERM GOALS: Target date: 09/02/21  Patient will be independent with initial HEP. Goal status: INITIAL  2.   Patient will be educated on strategies to decrease risk of falls.  Goal status: INITIAL   LONG TERM GOALS: Target date: 10/07/21   1.  Patient will be able to ambulate 600' with LRAD with good safety to access community.  Baseline: using SPC, decreased safety awareness and increased fall risk Goal status: INITIAL  2.  Patient will demonstrate improved functional LE strength as demonstrated by an increase by 1 in all muscle groups. Goal status: INITIAL  3.   Patient will score 25 on Berg Balance test to demonstrate lower risk of falls. (MCID= 8 points) .  Baseline: 15 Goal status: INITIAL  4. Patient will demonstrate decreased fall risk by scoring < 25 sec on TUG. Baseline: 33.65 Goal status: INITIAL    ASSESSMENT:  CLINICAL IMPRESSION: Patient is doing well overall, but is a little more unsteady on her feet today. Patient needs reminders to walk with cane instead of trying to without. Attempted mini squats but patient unable to do due to muscle weakness and imbalance. Focused on low level LE strengthening seated and supine.   OBJECTIVE IMPAIRMENTS Abnormal gait, decreased balance, decreased endurance, decreased mobility, difficulty walking, decreased strength, decreased safety awareness, improper body mechanics, postural dysfunction, and pain.   ACTIVITY LIMITATIONS carrying, lifting, bending, sitting, standing, squatting, stairs, transfers, and locomotion level  PARTICIPATION LIMITATIONS: cleaning, laundry, shopping, and community activity  PERSONAL FACTORS Age, Behavior pattern, Past/current experiences, and 3+ comorbidities: OA, cardiovascular disease, frequent falls, CHF, SLE  are also affecting patient's functional outcome.   REHAB POTENTIAL: Fair    CLINICAL DECISION MAKING: Evolving/moderate complexity  EVALUATION COMPLEXITY: Moderate  PLAN: PT FREQUENCY: 2x/week  PT DURATION: 10 weeks  PLANNED INTERVENTIONS: Therapeutic exercises, Therapeutic activity,  Neuromuscular re-education, Balance training, Gait training, Patient/Family education, Joint mobilization, Cryotherapy, Moist heat, Taping, Vasopneumatic device, Ionotophoresis 4mg /ml Dexamethasone, Manual therapy, and Re-evaluation  PLAN FOR NEXT SESSION: initiate HEP, work on transfers, mini squats, LE strengthening   Anderson, PT 08/12/2021, 5:47 PM

## 2021-08-12 NOTE — Progress Notes (Addendum)
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 Scores across stand-alone and embedded performance validity measures were within expectation. As such, the results of the current evaluation are believed to be a valid representation of Ms. Hunsberger's current cognitive functioning.  Test Results: Ms. Bustle was largely oriented at the time of the current evaluation. She was unable to provide the numerical aspect of her address or her apartment number.   Intellectual abilities based upon educational and vocational attainment were estimated to be in the average range. Premorbid abilities were estimated to be within the below average range based upon a single-word reading test.   Processing speed was variable, ranging from the well below average to average normative ranges. Basic attention was below average to average. More complex attention (e.g., working memory) was below average. Executive functioning was variable but largely below expectation, ranging from the exceptionally low to below average normative ranges. She did perform in the average range across a  task assessing safety and judgment.  Assessed receptive language abilities were exceptionally low. She exhibited difficulties across all aspects of this task, including correctly sequencing commands, understanding conceptual information (e.g., point to the blue square), understanding more complex sentence structure, and following multi-step commands. She did appear to answer questions asked to her adequately during interview. Assessed expressive language was variable. Verbal fluency (both phonemic and semantic) were below average, while confrontation naming ranged from the well below average to average normative ranges.      Assessed visuospatial/visuoconstructional abilities were well below average to below average. Points were lost on her drawing of a clock due to mild abnormalities in hand placement. Points were lost on her drawing of a complex figure due to mild visual distortions.     Learning (i.e., encoding) of novel verbal information was average. Spontaneous delayed recall (i.e., retrieval) of previously learned information was below average to average. Retention rates were 67% across a story learning task, 44% across a list learning task, and 44% across a figure drawing task. Performance across recognition tasks was below average to average, suggesting evidence for information consolidation.   Results of emotional screening instruments suggested that recent symptoms of generalized anxiety were in the mild range, while symptoms of depression were also within the mild range. A screening instrument assessing recent sleep quality suggested the presence of minimal sleep dysfunction.  Tables of Scores:   Note: This summary of test scores accompanies the interpretive report and should not be considered in isolation  without reference to the appropriate sections in the text. Descriptors are based on appropriate normative data and may be adjusted based on clinical judgment. Terms such as "Within Normal  Limits" and "Outside Normal Limits" are used when a more specific description of the test score cannot be determined.       Percentile - Normative Descriptor > 98 - Exceptionally High 91-97 - Well Above Average 75-90 - Above Average 25-74 - Average 9-24 - Below Average 2-8 - Well Below Average < 2 - Exceptionally Low       Validity:   DESCRIPTOR       Dot Counting Test: --- --- Within Normal Limits  RBANS Effort Index: --- --- Within Normal Limits  WAIS-IV Reliable Digit Span: --- --- Within Normal Limits       Orientation:      Raw Score Percentile   NAB Orientation, Form 1 28/29 --- ---       Cognitive Screening:      Raw Score Percentile   SLUMS: 24/30 --- ---       RBANS, Form A: Standard Score/ Scaled Score Percentile   Total Score 84 14 Below Average  Immediate Memory 100 50 Average    List Learning 9 37 Average    Story Memory 11 63 Average  Visuospatial/Constructional 75 5 Well Below Average    Figure Copy 7 16 Below Average    Line Orientation 11/20 3-9 Well Below Average  Language 92 30 Average    Picture Naming 9/10 26-50 Average    Semantic Fluency 7 16 Below Average  Attention 82 12 Below Average    Digit Span 7 16 Below Average    Coding 7 16 Below Average  Delayed Memory 95 37 Average    List Recall 4/10 26-50 Average    List Recognition 19/20 26-50 Average    Story Recall 9 37 Average    Story Recognition 9/12 16-26 Below Average    Figure Recall 6 9 Below Average    Figure Recognition 6/8 30-52 Average        Intellectual Functioning:      Standard Score Percentile   Test of Premorbid Functioning: 85 16 Below Average       Attention/Executive Function:     Trail Making Test (TMT): Raw Score (T Score) Percentile     Part A 72 secs.,  0 errors (31) 3 Well Below Average    Part B 214 secs.,  3 errors (40) 16 Below Average         Scaled Score Percentile   WAIS-IV Digit Span: 8 25 Average    Forward 10 50 Average    Backward 6 9 Below  Average    Sequencing 7 16 Below Average       D-KEFS Color-Word Interference Test: Raw Score (Scaled Score) Percentile     Color Naming 43 secs. (6) 9 Below Average    Word Reading 29 secs. (8) 25 Average    Inhibition Discontinued --- Impaired    Inhibition/Switching Discontinued --- Impaired       D-KEFS Verbal Fluency Test: Raw Score (Scaled Score) Percentile     Letter Total Correct 24 (7) 16 Below Average    Category Total Correct 29 (8) 25 Average    Category Switching Total Correct 7 (4) 2 Well Below Average    Category Switching Accuracy 4 (3) 1 Exceptionally Low      Total Set Loss Errors 3 (9) 37 Average  Total Repetition Errors 5 (8) 25 Average       NAB Executive Functions Module, Form 1: T Score Percentile     Judgment 44 27 Average       Language:     Verbal Fluency Test: Raw Score (T Score) Percentile     Phonemic Fluency (FAS) 24 (38) 12 Below Average    Animal Fluency 11 (39) 14 Below Average        NAB Language Module, Form 1: T Score Percentile     Auditory Comprehension 19 <1 Exceptionally Low    Naming 26/31 (28) 2 Well Below Average       Visuospatial/Visuoconstruction:      Raw Score Percentile   Clock Drawing: 8/10 --- Within Normal Limits        Scaled Score Percentile   WAIS-IV Block Design: 7 16 Below Average  WAIS-IV Matrix Reasoning: 6 9 Below Average       Mood and Personality:      Raw Score Percentile   Geriatric Depression Scale: 11 --- Mild  Geriatric Anxiety Scale: 12 --- Mild    Somatic 7 --- Mild    Cognitive 2 --- Minimal    Affective 3 --- Minimal       Additional Questionnaires:      Raw Score Percentile   PROMIS Sleep Disturbance Questionnaire: 22 --- None to Slight   Informed Consent and Coding/Compliance:   The current evaluation represents a clinical evaluation for the purposes previously outlined by the referral source and is in no way reflective of a forensic evaluation.   Ms. Dickerson was provided with a  verbal description of the nature and purpose of the present neuropsychological evaluation. Also reviewed were the foreseeable risks and/or discomforts and benefits of the procedure, limits of confidentiality, and mandatory reporting requirements of this provider. The patient was given the opportunity to ask questions and receive answers about the evaluation. Oral consent to participate was provided by the patient.   This evaluation was conducted by Christia Reading, Ph.D., ABPP-CN, board certified clinical neuropsychologist. Ms. Dubeau completed a clinical interview with Dr. Melvyn Novas, billed as one unit [phone], and 140 minutes of cognitive testing and scoring, billed as one unit [phone] and four additional units [id number]. Psychometrist Cruzita Lederer, B.S., assisted Dr. Melvyn Novas with test administration and scoring procedures. As a separate and discrete service, Dr. Melvyn Novas spent a total of 160 minutes in interpretation and report writing billed as one unit [phone] and two units [id number].

## 2021-08-15 ENCOUNTER — Other Ambulatory Visit: Payer: Self-pay | Admitting: Neurology

## 2021-08-17 ENCOUNTER — Ambulatory Visit: Payer: Medicare HMO

## 2021-08-17 DIAGNOSIS — R293 Abnormal posture: Secondary | ICD-10-CM

## 2021-08-17 DIAGNOSIS — M6281 Muscle weakness (generalized): Secondary | ICD-10-CM

## 2021-08-17 DIAGNOSIS — R296 Repeated falls: Secondary | ICD-10-CM

## 2021-08-17 DIAGNOSIS — R262 Difficulty in walking, not elsewhere classified: Secondary | ICD-10-CM

## 2021-08-17 DIAGNOSIS — R2689 Other abnormalities of gait and mobility: Secondary | ICD-10-CM

## 2021-08-17 NOTE — Therapy (Signed)
OUTPATIENT PHYSICAL THERAPY NEURO TREATMENT   Patient Name: Lindsey Huber MRN: [id number] DOB:09/22/1945, 76 y.o., female Today's Date: 08/17/2021   PCP: Evangeline Dakin REFERRING PROVIDER: Sheliah Hatch   PT End of Session - 08/17/21 1702     Visit Number 6    Date for PT Re-Evaluation 10/07/21    Progress Note Due on Visit 10    PT Start Time 1700    PT Stop Time 1745    PT Time Calculation (min) 45 min    Activity Tolerance Patient tolerated treatment well;Patient limited by fatigue    Behavior During Therapy [facility] for tasks assessed/performed                Past Medical History:  Diagnosis Date   Achalasia    Acquired hypothyroidism 08/20/2020   Acute metabolic encephalopathy 07/25/2021   AICD (automatic cardioverter/defibrillator) present 11/14/2019   Allergic rhinitis    Aortic valve disorder 03/26/2002   Atherosclerosis of abdominal aorta 05/01/2020   Cardiomyopathy    CHB (complete heart block) 08/18/2017   CHF (congestive heart failure)    Cholelithiasis without obstruction 05/01/2020   Chronic gouty arthritis    Chronic heart failure with preserved ejection fraction 05/01/2020   Chronic kidney disease (CKD), active medical management without dialysis, stage 3 (moderate) 05/01/2020   Closed torus fracture of distal end of right radius with delayed healing 08/12/2021   Delusional thoughts    Diverticulosis of colon 05/01/2020   Epistaxis    Essential (primary) hypertension 08/18/2017   Gastroesophageal reflux disease 05/01/2020   Gastrointestinal hemorrhage    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 11/14/2019   Hypercoagulability due to atrial fibrillation 05/01/2020   Hyperlipidemia 11/14/2019   Hypocalcemia 12/14/2020   Hypoglycemia    Hypokalemia 12/14/2020   Hypomagnesemia 12/14/2020   Hypotension 12/14/2020   Idiopathic pulmonary fibrosis 05/01/2020   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   Iron deficiency anemia     Long term (current) use of anticoagulants    Malnutrition of mild degree [name]: 75% to less than 90% of standard weight)    Mild dementia 08/12/2021   Mitral and aortic incompetence 11/14/2019   Non-rheumatic atrial fibrillation 05/01/2020   Non-toxic multinodular goiter 08/20/2020   NSVT (nonsustained ventricular tachycardia) 08/18/2017   Oropharyngeal dysphagia    Osteoarthritis of hip 05/01/2020   Osteoarthritis of knee 05/01/2020   Osteopenia of neck of left femur    Pain of left hip joint 12/12/2017   Proteinuria 05/01/2020   Raynaud's disease    Recurrent falls 04/09/2021   Rheumatoid arthritis    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   Septic shock 03/10/2020   Slow transit constipation    Systemic lupus erythematosus 05/01/2020   Thrombophilia    Transient ischemic attack    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   Vitamin D deficiency    Past Surgical History:  Procedure Laterality Date   BREAST BIOPSY Left    CARDIAC VALVE SURGERY     CARPAL TUNNEL RELEASE     COLONOSCOPY WITH PROPOFOL N/A 12/20/2020   Procedure: COLONOSCOPY WITH PROPOFOL;  Surgeon: Willis Modena, MD;  Location: [facility] ENDOSCOPY;  Service: Endoscopy;  Laterality: N/A;   HEMORRHOID SURGERY     PACEMAKER INSERTION     RIGHT/LEFT HEART CATH AND CORONARY ANGIOGRAPHY N/A 11/27/2020   Procedure: RIGHT/LEFT HEART CATH AND CORONARY ANGIOGRAPHY;  Surgeon: Dolores Patty, MD;  Location: [facility] INVASIVE CV LAB;  Service: Cardiovascular;  Laterality: N/A;   TONSILLECTOMY  Patient Active Problem List   Diagnosis Date Noted   Mild dementia 08/12/2021   Allergic rhinitis    Chronic gouty arthritis    Epistaxis    Hypoglycemia    Iron deficiency anemia    Long term (current) use of anticoagulants    Malnutrition of mild degree [name]: 75% to less than 90% of standard weight)    Oropharyngeal dysphagia    Osteopenia of neck of left femur    Raynaud's disease     Rheumatoid arthritis    Transient ischemic attack    Slow transit constipation    Thrombophilia    Achalasia    Delusional thoughts    Recurrent falls 04/09/2021   Hypotension 12/14/2020   Hypokalemia 12/14/2020   Hypocalcemia 12/14/2020   Hypomagnesemia 12/14/2020   Non-toxic multinodular goiter 08/20/2020   Acquired hypothyroidism 08/20/2020   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   Vitamin D deficiency 05/01/2020   Proteinuria 05/01/2020   Osteoarthritis of knee 05/01/2020   Osteoarthritis of hip 05/01/2020   Gastroesophageal reflux disease 05/01/2020   Idiopathic pulmonary fibrosis 05/01/2020   Cardiomyopathy 05/01/2020   Hypercoagulability due to atrial fibrillation 05/01/2020   Systemic lupus erythematosus 05/01/2020   Chronic heart failure with preserved ejection fraction 05/01/2020   Chronic kidney disease (CKD), active medical management without dialysis, stage 3 (moderate) 05/01/2020   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   Non-rheumatic atrial fibrillation 05/01/2020   Atherosclerosis of abdominal aorta 05/01/2020   Diverticulosis of colon 05/01/2020   Cholelithiasis without obstruction 05/01/2020   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   AICD (automatic cardioverter/defibrillator) present 11/14/2019   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 11/14/2019   Mitral and aortic incompetence 11/14/2019   Hyperlipidemia 11/14/2019   Pain of left hip joint 12/12/2017   Essential (primary) hypertension 08/18/2017   CHB (complete heart block) 08/18/2017   NSVT (nonsustained ventricular tachycardia) 08/18/2017   Aortic valve disorder 03/26/2002    ONSET DATE: 06/18/21  REFERRING DIAG: R29.6  THERAPY DIAG:  Muscle weakness (generalized)  Difficulty in walking, not elsewhere classified  Other abnormalities of gait and mobility  Repeated falls  Abnormal posture  Rationale for Evaluation and  Treatment Rehabilitation  SUBJECTIVE:                                                                                                                                                                                              SUBJECTIVE STATEMENT: Just sore, no pain.   Pt accompanied by: family member  PERTINENT HISTORY: she has a pacemaker, CVD, angiographs 2022, systemic lupus, CHF   PAIN:  Are  you having pain? No  PRECAUTIONS: Fall  WEIGHT BEARING RESTRICTIONS No  FALLS: Has patient fallen in last 6 months? Yes. Number of falls maybe 4  LIVING ENVIRONMENT: Lives with: lives with their family Lives in: House/apartment Stairs: No Has following equipment at home: Single point cane, Environmental consultant - 2 wheeled, Wheelchair (manual), Electronics engineer, and Grab bars  PLOF: Independent with basic ADLs, Independent with household mobility with device, and Independent with community mobility with device  PATIENT GOALS able to walk better and safer  OBJECTIVE:   COORDINATION: Decreased coordination with walking and balance   POSTURE: rounded shoulders, forward head, increased thoracic kyphosis, anterior pelvic tilt, and flexed trunk   LOWER EXTREMITY ROM:   WFL limited due to weakness and coordination, but she is able to walk with modified IND  LOWER EXTREMITY MMT:    MMT Right Eval Left Eval  Hip flexion 3 3  Hip extension    Hip abduction    Hip adduction    Hip internal rotation    Hip external rotation    Knee flexion 2+ 2 w/pain  Knee extension 2- w/pain 2 w/pain  Ankle dorsiflexion 3+ 3+  Ankle plantarflexion 4 4  Ankle inversion    Ankle eversion    (Blank rows = not tested)    GAIT: EVAL Gait pattern: decreased step length- Right, decreased step length- Left, decreased stance time- Right, decreased stance time- Left, decreased stride length, shuffling, ataxic, trendelenburg, decreased trunk rotation, narrow BOS, poor foot clearance- Right, and poor foot clearance-  Left Distance walked: in clinic distances Assistive device utilized: Single point cane Level of assistance: CGA and Min A Comments: walks with SPC, needs CGA and minA for longer distances due to unsafe gait pattern and high fall risk  FUNCTIONAL TESTs:  EVAL 5 times sit to stand: attempted but unable to complete Timed up and go (TUG): 33.65 w/SPC and modA to stand [employer] Scale: 15/56 Functional gait assessment: TBD   TODAY'S TREATMENT:  08/17/21 Recumbent bike L2 x 6 min Ambulate 1 lap with cane with SBA, then 3 laps without AD and CGA LAQ 2.5# 3 x10 Standing marches with 2.5# 2x10 Step ups onto airex with HHA, 2x10 each, cues to increase hip flexion Sidestepping onto airex 1x10 B SLR 2x10 B Ball squeeze x10 Bridge with ball squeeze 2x10   08/12/21 Nustep L1x6 mins  Fitter 2x10 bilat  Heel taps 4" with min-modA HS curls greenTB  LAQ 2.5# 3 x10 Standing marches 2.5# 2x10 SLR 2x10 Bridges 2x10 Banded fall outs redTB x10 bilat   08/10/21 Nustep L1x90mins  Bike L1 x60mins  Standing marches 2# LAQ 2# 2x10 2 way hip 2# 2x10 Heel raises 2# 2x12   08/05/21 Nustep L1 x40mins HS curls redTB 2x10 Ankle redTB x10 each way both sides LAQ 1# 2x10 Seated marches 1# 2x10 each side Seated OHP 2x10 Seated leg press against fitter, low resistance 2x10 each side     Heel raises 2x12 with 2HHA    Seated banded abd greenTB 2x10   Ball squeezes 2x10, 3s holds  08/04/21 NuStep L1 x6 min  Hamstring curls red 2x10 LAQ 1lb 2x10 Seated march 1lb 2x10    Sit to stand x3 heavy UE use    Standing march w/ SPC 2x10    Ball squeezes 2x10   PATIENT EDUCATION: Education details: POC Person educated: Patient and Child(ren) Education method: Explanation Education comprehension: verbalized understanding   HOME EXERCISE PROGRAM: TBD  GOALS: Goals reviewed with patient?  No  SHORT TERM GOALS: Target date: 09/02/21  Patient will be independent with initial HEP. Goal status:  INITIAL  2.  Patient will be educated on strategies to decrease risk of falls.  Goal status: INITIAL   LONG TERM GOALS: Target date: 10/07/21   1.  Patient will be able to ambulate 600' with LRAD with good safety to access community.  Baseline: using SPC, decreased safety awareness and increased fall risk Goal status: Progressing   2.  Patient will demonstrate improved functional LE strength as demonstrated by an increase by 1 in all muscle groups. Goal status: INITIAL  3.   Patient will score 25 on Berg Balance test to demonstrate lower risk of falls. (MCID= 8 points) .  Baseline: 15 Goal status: INITIAL  4. Patient will demonstrate decreased fall risk by scoring < 25 sec on TUG. Baseline: 33.65 Goal status: INITIAL    ASSESSMENT:  CLINICAL IMPRESSION: Pt enters today doing well. Says her knee occasionally feels like it gives out because she feels like it is weak. We did some LE strengthening and balance training. Pt requires verbal cues to increase hip flexion during step ups for toe clearance. Would benefit from additional strengthening and balance training to reduce fall risk.    OBJECTIVE IMPAIRMENTS Abnormal gait, decreased balance, decreased endurance, decreased mobility, difficulty walking, decreased strength, decreased safety awareness, improper body mechanics, postural dysfunction, and pain.   ACTIVITY LIMITATIONS carrying, lifting, bending, sitting, standing, squatting, stairs, transfers, and locomotion level  PARTICIPATION LIMITATIONS: cleaning, laundry, shopping, and community activity  PERSONAL FACTORS Age, Behavior pattern, Past/current experiences, and 3+ comorbidities: OA, cardiovascular disease, frequent falls, CHF, SLE  are also affecting patient's functional outcome.   REHAB POTENTIAL: Fair    CLINICAL DECISION MAKING: Evolving/moderate complexity  EVALUATION COMPLEXITY: Moderate  PLAN: PT FREQUENCY: 2x/week  PT DURATION: 10 weeks  PLANNED  INTERVENTIONS: Therapeutic exercises, Therapeutic activity, Neuromuscular re-education, Balance training, Gait training, Patient/Family education, Joint mobilization, Cryotherapy, Moist heat, Taping, Vasopneumatic device, Ionotophoresis 4mg /ml Dexamethasone, Manual therapy, and Re-evaluation  PLAN FOR NEXT SESSION: Strengthening and balance training.    Nkechi Linehan, SPTA 08/17/2021, 5:03 PM

## 2021-08-18 ENCOUNTER — Ambulatory Visit (INDEPENDENT_AMBULATORY_CARE_PROVIDER_SITE_OTHER): Payer: Medicare HMO

## 2021-08-18 DIAGNOSIS — I429 Cardiomyopathy, unspecified: Secondary | ICD-10-CM | POA: Diagnosis not present

## 2021-08-18 LAB — CUP PACEART REMOTE DEVICE CHECK
Battery Remaining Longevity: 66 mo
Battery Voltage: 2.98 V
Brady Statistic RV Percent Paced: 99.03 %
Date Time Interrogation Session: 20230725044227
HighPow Impedance: 50 Ohm
Implantable Lead Implant Date: 20120920
Implantable Lead Location: 753862
Implantable Pulse Generator Implant Date: 20190809
Lead Channel Impedance Value: 285 Ohm
Lead Channel Impedance Value: 304 Ohm
Lead Channel Pacing Threshold Amplitude: 0.625 V
Lead Channel Pacing Threshold Pulse Width: 0.4 ms
Lead Channel Sensing Intrinsic Amplitude: 13 mV
Lead Channel Sensing Intrinsic Amplitude: 13 mV
Lead Channel Setting Pacing Amplitude: 1.25 V
Lead Channel Setting Pacing Pulse Width: 0.4 ms
Lead Channel Setting Sensing Sensitivity: 0.3 mV

## 2021-08-19 ENCOUNTER — Ambulatory Visit (INDEPENDENT_AMBULATORY_CARE_PROVIDER_SITE_OTHER): Payer: Medicare HMO | Admitting: Psychology

## 2021-08-19 ENCOUNTER — Ambulatory Visit: Payer: Medicare HMO

## 2021-08-19 DIAGNOSIS — I6781 Acute cerebrovascular insufficiency: Secondary | ICD-10-CM

## 2021-08-19 DIAGNOSIS — F03A2 Unspecified dementia, mild, with psychotic disturbance: Secondary | ICD-10-CM | POA: Diagnosis not present

## 2021-08-19 NOTE — Progress Notes (Signed)
   Neuropsychology Feedback Session Lindsey Huber. [facility] [place] Department of Neurology  Reason for Referral:   Lindsey Huber is a 76 y.o. right-handed African-American female referred by Shon Millet, D.O., to characterize her current cognitive functioning and assist with diagnostic clarity and treatment planning in the context of subjective cognitive decline and ongoing delusional thinking with potential hallucinations.   Feedback:   Lindsey Huber completed a comprehensive neuropsychological evaluation on 08/12/2021. Please refer to that encounter for the full report and recommendations. Briefly, results suggested primary deficits surrounding executive functioning and receptive language. However, further performance variability was exhibited across processing speed, confrontation naming, and visuospatial abilities. Regarding etiology, a progressing Lewy body dementia presentation in early stages remains plausible. While she denied fully-formed visual hallucinations (this is contradicted in her medical record to some degree) and does not appear to display REM sleep behaviors, her daughter did report fluctuations in alertness and delusional thinking. While ongoing delusional thinking is not always considered a core feature, it is common in Lewy body disease and notably more common relative to other neurodegenerative processes such as Alzheimer's disease. Across testing, primary areas of weakness/variability surrounded processing speed, executive functioning, and visuospatial abilities, which would certainly align with Lewy body disease. Variability rather than consistent impairment would suggest earlier stages if indeed present. Alternatively, Lindsey Huber pattern across testing is also reasonably consistent with a primary vascular etiology given areas of weakness and variability. Most recent neuroimaging in the form of head CTs have revealed moderate microvascular ischemic disease and she  does have a very large number of medical ailments which would directly influence cerebrovascular functioning (including two documented TIA experiences in the past 13 months). Delusional thinking would be less common in a vascular presentation. At the very least, there is a likely vascular contribution to her current presentation and a potential mixed dementia presentation.   Lindsey Huber was accompanied by her daughter during the current feedback session. Content of the current session focused on the results of her neuropsychological evaluation. Lindsey Huber was given the opportunity to ask questions and her questions were answered. She was encouraged to reach out should additional questions arise. A copy of her report was provided at the conclusion of the visit.      20 minutes were spent conducting the current feedback session with Lindsey Huber, billed as one unit [phone].

## 2021-08-21 ENCOUNTER — Ambulatory Visit: Payer: Medicare HMO | Admitting: Internal Medicine

## 2021-08-21 ENCOUNTER — Ambulatory Visit: Payer: Medicare HMO | Admitting: Neurology

## 2021-08-24 ENCOUNTER — Ambulatory Visit: Payer: Medicare HMO

## 2021-08-24 DIAGNOSIS — R296 Repeated falls: Secondary | ICD-10-CM | POA: Diagnosis not present

## 2021-08-24 DIAGNOSIS — R262 Difficulty in walking, not elsewhere classified: Secondary | ICD-10-CM

## 2021-08-24 DIAGNOSIS — R293 Abnormal posture: Secondary | ICD-10-CM

## 2021-08-24 DIAGNOSIS — M6281 Muscle weakness (generalized): Secondary | ICD-10-CM

## 2021-08-24 DIAGNOSIS — R2689 Other abnormalities of gait and mobility: Secondary | ICD-10-CM

## 2021-08-24 NOTE — Therapy (Signed)
OUTPATIENT PHYSICAL THERAPY NEURO TREATMENT   Patient Name: Lindsey Huber MRN: [id number] DOB:25-Mar-1945, 76 y.o., female Today's Date: 08/24/2021   PCP: Fredda Hammed REFERRING PROVIDER: Vernelle Emerald   PT End of Session - 08/24/21 1709     Visit Number 7    Date for PT Re-Evaluation 10/07/21    Progress Note Due on Visit 10    PT Start Time 1708    PT Stop Time [id number]    PT Time Calculation (min) 39 min    Equipment Utilized During Treatment Gait belt    Activity Tolerance Patient tolerated treatment well;Patient limited by fatigue    Behavior During Therapy [facility] for tasks assessed/performed                 Past Medical History:  Diagnosis Date   Achalasia    Acquired hypothyroidism [id number]   Acute metabolic encephalopathy 123456   AICD (automatic cardioverter/defibrillator) present 11/14/2019   Allergic rhinitis    Aortic valve disorder 03/26/2002   Atherosclerosis of abdominal aorta 05/01/2020   Cardiomyopathy    CHB (complete heart block) 08/18/2017   CHF (congestive heart failure)    Cholelithiasis without obstruction 05/01/2020   Chronic gouty arthritis    Chronic heart failure with preserved ejection fraction 05/01/2020   Chronic kidney disease (CKD), active medical management without dialysis, stage 3 (moderate) 05/01/2020   Closed torus fracture of distal end of right radius with delayed healing 08/12/2021   Delusional thoughts    Diverticulosis of colon 05/01/2020   Epistaxis    Essential (primary) hypertension 08/18/2017   Gastroesophageal reflux disease 05/01/2020   Gastrointestinal hemorrhage    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 11/14/2019   Hypercoagulability due to atrial fibrillation 05/01/2020   Hyperlipidemia 11/14/2019   Hypocalcemia 12/14/2020   Hypoglycemia    Hypokalemia 12/14/2020   Hypomagnesemia 12/14/2020   Hypotension 12/14/2020   Idiopathic pulmonary fibrosis 05/01/2020    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   Iron deficiency anemia    Long term (current) use of anticoagulants    Malnutrition of mild degree [name]: 75% to less than 90% of standard weight)    Mild dementia 08/12/2021   Mitral and aortic incompetence 11/14/2019   Non-rheumatic atrial fibrillation 05/01/2020   Non-toxic multinodular goiter 08/20/2020   NSVT (nonsustained ventricular tachycardia) 08/18/2017   Oropharyngeal dysphagia    Osteoarthritis of hip 05/01/2020   Osteoarthritis of knee 05/01/2020   Osteopenia of neck of left femur    Pain of left hip joint 12/12/2017   Proteinuria 05/01/2020   Raynaud's disease    Recurrent falls 04/09/2021   Rheumatoid arthritis    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   Septic shock 03/10/2020   Slow transit constipation    Systemic lupus erythematosus 05/01/2020   Thrombophilia    Transient ischemic attack    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   Vitamin D deficiency    Past Surgical History:  Procedure Laterality Date   BREAST BIOPSY Left    CARDIAC VALVE SURGERY     CARPAL TUNNEL RELEASE     COLONOSCOPY WITH PROPOFOL N/A 12/20/2020   Procedure: COLONOSCOPY WITH PROPOFOL;  Surgeon: Arta Silence, MD;  Location: [facility];  Service: Endoscopy;  Laterality: N/A;   HEMORRHOID SURGERY     PACEMAKER INSERTION     RIGHT/LEFT HEART CATH AND CORONARY ANGIOGRAPHY N/A 11/27/2020   Procedure: RIGHT/LEFT HEART CATH AND CORONARY ANGIOGRAPHY;  Surgeon: Jolaine Artist, MD;  Location: [facility];  Service: Cardiovascular;  Laterality: N/A;   TONSILLECTOMY     Patient Active Problem List   Diagnosis Date Noted   Mild dementia 08/12/2021   Allergic rhinitis    Chronic gouty arthritis    Epistaxis    Hypoglycemia    Iron deficiency anemia    Long term (current) use of anticoagulants    Malnutrition of mild degree [name]: 75% to less than 90% of standard weight)    Oropharyngeal dysphagia     Osteopenia of neck of left femur    Raynaud's disease    Rheumatoid arthritis    Transient ischemic attack    Slow transit constipation    Thrombophilia    Achalasia    Delusional thoughts    Recurrent falls 04/09/2021   Hypotension 12/14/2020   Hypokalemia 12/14/2020   Hypocalcemia 12/14/2020   Hypomagnesemia 12/14/2020   Non-toxic multinodular goiter 08/20/2020   Acquired hypothyroidism 08/20/2020   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   Vitamin D deficiency 05/01/2020   Proteinuria 05/01/2020   Osteoarthritis of knee 05/01/2020   Osteoarthritis of hip 05/01/2020   Gastroesophageal reflux disease 05/01/2020   Idiopathic pulmonary fibrosis 05/01/2020   Cardiomyopathy 05/01/2020   Hypercoagulability due to atrial fibrillation 05/01/2020   Systemic lupus erythematosus 05/01/2020   Chronic heart failure with preserved ejection fraction 05/01/2020   Chronic kidney disease (CKD), active medical management without dialysis, stage 3 (moderate) 05/01/2020   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   Non-rheumatic atrial fibrillation 05/01/2020   Atherosclerosis of abdominal aorta 05/01/2020   Diverticulosis of colon 05/01/2020   Cholelithiasis without obstruction 05/01/2020   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   AICD (automatic cardioverter/defibrillator) present 11/14/2019   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 11/14/2019   Mitral and aortic incompetence 11/14/2019   Hyperlipidemia 11/14/2019   Pain of left hip joint 12/12/2017   Essential (primary) hypertension 08/18/2017   CHB (complete heart block) 08/18/2017   NSVT (nonsustained ventricular tachycardia) 08/18/2017   Aortic valve disorder 03/26/2002    ONSET DATE: 06/18/21  REFERRING DIAG: R29.6  THERAPY DIAG:  Muscle weakness (generalized)  Difficulty in walking, not elsewhere classified  Other abnormalities of gait and mobility  Repeated  falls  Abnormal posture  Rationale for Evaluation and Treatment Rehabilitation  SUBJECTIVE:                                                                                                                                                                                              SUBJECTIVE STATEMENT: I was hurting bad over the weekend in my knees, ankles, and back. Right now it is  not hurting. No falls.   Pt accompanied by: family member  PERTINENT HISTORY: she has a pacemaker, CVD, angiographs 2022, systemic lupus, CHF   PAIN:  Are you having pain? No  PRECAUTIONS: Fall  WEIGHT BEARING RESTRICTIONS No  FALLS: Has patient fallen in last 6 months? Yes. Number of falls maybe 4  LIVING ENVIRONMENT: Lives with: lives with their family Lives in: House/apartment Stairs: No Has following equipment at home: Single point cane, Environmental consultant - 2 wheeled, Wheelchair (manual), Tour manager, and Grab bars  PLOF: Independent with basic ADLs, Independent with household mobility with device, and Independent with community mobility with device  PATIENT GOALS able to walk better and safer  OBJECTIVE:   COORDINATION: Decreased coordination with walking and balance   POSTURE: rounded shoulders, forward head, increased thoracic kyphosis, anterior pelvic tilt, and flexed trunk   LOWER EXTREMITY ROM:   WFL limited due to weakness and coordination, but she is able to walk with modified IND  LOWER EXTREMITY MMT:    MMT Right Eval Left Eval  Hip flexion 3 3  Hip extension    Hip abduction    Hip adduction    Hip internal rotation    Hip external rotation    Knee flexion 2+ 2 w/pain  Knee extension 2- w/pain 2 w/pain  Ankle dorsiflexion 3+ 3+  Ankle plantarflexion 4 4  Ankle inversion    Ankle eversion    (Blank rows = not tested)    GAIT: EVAL Gait pattern: decreased step length- Right, decreased step length- Left, decreased stance time- Right, decreased stance time- Left,  decreased stride length, shuffling, ataxic, trendelenburg, decreased trunk rotation, narrow BOS, poor foot clearance- Right, and poor foot clearance- Left Distance walked: in clinic distances Assistive device utilized: Single point cane Level of assistance: CGA and Min A Comments: walks with SPC, needs CGA and minA for longer distances due to unsafe gait pattern and high fall risk  FUNCTIONAL TESTs:  EVAL 5 times sit to stand: attempted but unable to complete Timed up and go (TUG): 33.65 w/SPC and modA to stand Berg Balance Scale: 15/56 Functional gait assessment: TBD   TODAY'S TREATMENT:  08/19/21 Nustep L3 x70mins Ambulating w/o AD 2 laps minA Side stepping over obstacles  Knee ext 5# 2x10 HS curls 10# x10, 15# x10 Shoulder ext 5# 2x10 Standing marches 2.5# 2x10, minA   08/17/21 Recumbent bike L2 x 6 min Ambulate 1 lap with cane with SBA, then 3 laps without AD and CGA LAQ 2.5# 3 x10 Standing marches with 2.5# 2x10 Step ups onto airex with HHA, 2x10 each, cues to increase hip flexion Sidestepping onto airex 1x10 B SLR 2x10 B Ball squeeze x10 Bridge with ball squeeze 2x10   08/12/21 Nustep L1x6 mins  Fitter 2x10 bilat  Heel taps 4" with min-modA HS curls greenTB  LAQ 2.5# 3 x10 Standing marches 2.5# 2x10 SLR 2x10 Bridges 2x10 Banded fall outs redTB x10 bilat   08/10/21 Nustep L1x51mins  Bike L1 x103mins  Standing marches 2# LAQ 2# 2x10 2 way hip 2# 2x10 Heel raises 2# 2x12   08/05/21 Nustep L1 x58mins HS curls redTB 2x10 Ankle redTB x10 each way both sides LAQ 1# 2x10 Seated marches 1# 2x10 each side Seated OHP 2x10 Seated leg press against fitter, low resistance 2x10 each side     Heel raises 2x12 with 2HHA    Seated banded abd greenTB 2x10   Ball squeezes 2x10, 3s holds  08/04/21 NuStep L1 x6 min  Hamstring  curls red 2x10 LAQ 1lb 2x10 Seated march 1lb 2x10    Sit to stand x3 heavy UE use    Standing march w/ SPC 2x10    Ball squeezes  2x10   PATIENT EDUCATION: Education details: POC Person educated: Patient and Child(ren) Education method: Explanation Education comprehension: verbalized understanding   HOME EXERCISE PROGRAM: TBD  GOALS: Goals reviewed with patient? No  SHORT TERM GOALS: Target date: 09/02/21  Patient will be independent with initial HEP. Goal status: INITIAL  2.  Patient will be educated on strategies to decrease risk of falls.  Goal status: INITIAL   LONG TERM GOALS: Target date: 10/07/21   1.  Patient will be able to ambulate 600' with LRAD with good safety to access community.  Baseline: using SPC, decreased safety awareness and increased fall risk Goal status: Progressing   2.  Patient will demonstrate improved functional LE strength as demonstrated by an increase by 1 in all muscle groups. Goal status: INITIAL  3.   Patient will score 25 on Berg Balance test to demonstrate lower risk of falls. (MCID= 8 points) .  Baseline: 15 Goal status: INITIAL  4. Patient will demonstrate decreased fall risk by scoring < 25 sec on TUG. Baseline: 33.65 Goal status: INITIAL    ASSESSMENT:  CLINICAL IMPRESSION: Patient returns stating she is doing well overall. We continued to work on LE strengthening, balance, and gait. She has difficulty with knee extension at 5#, able to complete with minA at end range to push up all the way. Attempted STS again today but patient still unable to do without heavy UE hand use even on elevated table. Continue with LE strengthening to be able to complete transfers and STS without definite use of hands.    OBJECTIVE IMPAIRMENTS Abnormal gait, decreased balance, decreased endurance, decreased mobility, difficulty walking, decreased strength, decreased safety awareness, improper body mechanics, postural dysfunction, and pain.   ACTIVITY LIMITATIONS carrying, lifting, bending, sitting, standing, squatting, stairs, transfers, and locomotion level  PARTICIPATION  LIMITATIONS: cleaning, laundry, shopping, and community activity  PERSONAL FACTORS Age, Behavior pattern, Past/current experiences, and 3+ comorbidities: OA, cardiovascular disease, frequent falls, CHF, SLE  are also affecting patient's functional outcome.   REHAB POTENTIAL: Fair    CLINICAL DECISION MAKING: Evolving/moderate complexity  EVALUATION COMPLEXITY: Moderate  PLAN: PT FREQUENCY: 2x/week  PT DURATION: 10 weeks  PLANNED INTERVENTIONS: Therapeutic exercises, Therapeutic activity, Neuromuscular re-education, Balance training, Gait training, Patient/Family education, Joint mobilization, Cryotherapy, Moist heat, Taping, Vasopneumatic device, Ionotophoresis 4mg /ml Dexamethasone, Manual therapy, and Re-evaluation  PLAN FOR NEXT SESSION: Strengthening and balance training. Mini squats   , PT 08/24/2021, 5:49 PM

## 2021-08-26 ENCOUNTER — Ambulatory Visit: Payer: Medicare HMO | Attending: Internal Medicine

## 2021-08-26 DIAGNOSIS — R296 Repeated falls: Secondary | ICD-10-CM

## 2021-08-26 DIAGNOSIS — R2689 Other abnormalities of gait and mobility: Secondary | ICD-10-CM | POA: Diagnosis present

## 2021-08-26 DIAGNOSIS — R293 Abnormal posture: Secondary | ICD-10-CM

## 2021-08-26 DIAGNOSIS — M6281 Muscle weakness (generalized): Secondary | ICD-10-CM | POA: Diagnosis present

## 2021-08-26 DIAGNOSIS — R262 Difficulty in walking, not elsewhere classified: Secondary | ICD-10-CM | POA: Diagnosis present

## 2021-08-26 NOTE — Therapy (Signed)
OUTPATIENT PHYSICAL THERAPY NEURO TREATMENT   Patient Name: Lindsey Huber MRN: [id number] DOB:Mar 09, 1945, 76 y.o., female Today's Date: 08/26/2021   PCP: Fredda Hammed REFERRING PROVIDER: Vernelle Emerald   PT End of Session - 08/26/21 1658     Visit Number 8    Date for PT Re-Evaluation 10/07/21    Progress Note Due on Visit 10    PT Start Time 1659    PT Stop Time [id number]    PT Time Calculation (min) 45 min    Equipment Utilized During Treatment Gait belt    Activity Tolerance Patient tolerated treatment well;Patient limited by fatigue    Behavior During Therapy [facility] for tasks assessed/performed                 Past Medical History:  Diagnosis Date   Achalasia    Acquired hypothyroidism [id number]   Acute metabolic encephalopathy 123456   AICD (automatic cardioverter/defibrillator) present 11/14/2019   Allergic rhinitis    Aortic valve disorder 03/26/2002   Atherosclerosis of abdominal aorta 05/01/2020   Cardiomyopathy    CHB (complete heart block) 08/18/2017   CHF (congestive heart failure)    Cholelithiasis without obstruction 05/01/2020   Chronic gouty arthritis    Chronic heart failure with preserved ejection fraction 05/01/2020   Chronic kidney disease (CKD), active medical management without dialysis, stage 3 (moderate) 05/01/2020   Closed torus fracture of distal end of right radius with delayed healing 08/12/2021   Delusional thoughts    Diverticulosis of colon 05/01/2020   Epistaxis    Essential (primary) hypertension 08/18/2017   Gastroesophageal reflux disease 05/01/2020   Gastrointestinal hemorrhage    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 11/14/2019   Hypercoagulability due to atrial fibrillation 05/01/2020   Hyperlipidemia 11/14/2019   Hypocalcemia 12/14/2020   Hypoglycemia    Hypokalemia 12/14/2020   Hypomagnesemia 12/14/2020   Hypotension 12/14/2020   Idiopathic pulmonary fibrosis 05/01/2020    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   Iron deficiency anemia    Long term (current) use of anticoagulants    Malnutrition of mild degree Lindsey Huber: 75% to less than 90% of standard weight)    Mild dementia 08/12/2021   Mitral and aortic incompetence 11/14/2019   Non-rheumatic atrial fibrillation 05/01/2020   Non-toxic multinodular goiter 08/20/2020   NSVT (nonsustained ventricular tachycardia) 08/18/2017   Oropharyngeal dysphagia    Osteoarthritis of hip 05/01/2020   Osteoarthritis of knee 05/01/2020   Osteopenia of neck of left femur    Pain of left hip joint 12/12/2017   Proteinuria 05/01/2020   Raynaud's disease    Recurrent falls 04/09/2021   Rheumatoid arthritis    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   Septic shock 03/10/2020   Slow transit constipation    Systemic lupus erythematosus 05/01/2020   Thrombophilia    Transient ischemic attack    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   Vitamin D deficiency    Past Surgical History:  Procedure Laterality Date   BREAST BIOPSY Left    CARDIAC VALVE SURGERY     CARPAL TUNNEL RELEASE     COLONOSCOPY WITH PROPOFOL N/A 12/20/2020   Procedure: COLONOSCOPY WITH PROPOFOL;  Surgeon: Arta Silence, MD;  Location: [facility] ENDOSCOPY;  Service: Endoscopy;  Laterality: N/A;   HEMORRHOID SURGERY     PACEMAKER INSERTION     RIGHT/LEFT HEART CATH AND CORONARY ANGIOGRAPHY N/A 11/27/2020   Procedure: RIGHT/LEFT HEART CATH AND CORONARY ANGIOGRAPHY;  Surgeon: Jolaine Artist, MD;  Location: [place] [facility];  Service: Cardiovascular;  Laterality: N/A;   TONSILLECTOMY     Patient Active Problem List   Diagnosis Date Noted   Mild dementia 08/12/2021   Allergic rhinitis    Chronic gouty arthritis    Epistaxis    Hypoglycemia    Iron deficiency anemia    Long term (current) use of anticoagulants    Malnutrition of mild degree Lindsey Huber: 75% to less than 90% of standard weight)    Oropharyngeal dysphagia     Osteopenia of neck of left femur    Raynaud's disease    Rheumatoid arthritis    Transient ischemic attack    Slow transit constipation    Thrombophilia    Achalasia    Delusional thoughts    Recurrent falls 04/09/2021   Hypotension 12/14/2020   Hypokalemia 12/14/2020   Hypocalcemia 12/14/2020   Hypomagnesemia 12/14/2020   Non-toxic multinodular goiter 08/20/2020   Acquired hypothyroidism 08/20/2020   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   Vitamin D deficiency 05/01/2020   Proteinuria 05/01/2020   Osteoarthritis of knee 05/01/2020   Osteoarthritis of hip 05/01/2020   Gastroesophageal reflux disease 05/01/2020   Idiopathic pulmonary fibrosis 05/01/2020   Cardiomyopathy 05/01/2020   Hypercoagulability due to atrial fibrillation 05/01/2020   Systemic lupus erythematosus 05/01/2020   Chronic heart failure with preserved ejection fraction 05/01/2020   Chronic kidney disease (CKD), active medical management without dialysis, stage 3 (moderate) 05/01/2020   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   Non-rheumatic atrial fibrillation 05/01/2020   Atherosclerosis of abdominal aorta 05/01/2020   Diverticulosis of colon 05/01/2020   Cholelithiasis without obstruction 05/01/2020   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   AICD (automatic cardioverter/defibrillator) present 11/14/2019   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 11/14/2019   Mitral and aortic incompetence 11/14/2019   Hyperlipidemia 11/14/2019   Pain of left hip joint 12/12/2017   Essential (primary) hypertension 08/18/2017   CHB (complete heart block) 08/18/2017   NSVT (nonsustained ventricular tachycardia) 08/18/2017   Aortic valve disorder 03/26/2002    ONSET DATE: 06/18/21  REFERRING DIAG: R29.6  THERAPY DIAG:  Muscle weakness (generalized)  Difficulty in walking, not elsewhere classified  Other abnormalities of gait and mobility  Repeated  falls  Abnormal posture  Rationale for Evaluation and Treatment Rehabilitation  SUBJECTIVE:                                                                                                                                                                                              SUBJECTIVE STATEMENT: "I am sore on every part of my body" reports pain a 6/10  Pt accompanied by:  family member  PERTINENT HISTORY: she has a pacemaker, CVD, angiographs 2022, systemic lupus, CHF   PAIN:  Are you having pain? No  PRECAUTIONS: Fall  WEIGHT BEARING RESTRICTIONS No  FALLS: Has patient fallen in last 6 months? Yes. Number of falls maybe 4  LIVING ENVIRONMENT: Lives with: lives with their family Lives in: House/apartment Stairs: No Has following equipment at home: Single point cane, Environmental consultant - 2 wheeled, Wheelchair (manual), Tour manager, and Grab bars  PLOF: Independent with basic ADLs, Independent with household mobility with device, and Independent with community mobility with device  PATIENT GOALS able to walk better and safer  OBJECTIVE:   COORDINATION: Decreased coordination with walking and balance   POSTURE: rounded shoulders, forward head, increased thoracic kyphosis, anterior pelvic tilt, and flexed trunk   LOWER EXTREMITY ROM:   WFL limited due to weakness and coordination, but she is able to walk with modified IND  LOWER EXTREMITY MMT:    MMT Right Eval Left Eval  Hip flexion 3 3  Hip extension    Hip abduction    Hip adduction    Hip internal rotation    Hip external rotation    Knee flexion 2+ 2 w/pain  Knee extension 2- w/pain 2 w/pain  Ankle dorsiflexion 3+ 3+  Ankle plantarflexion 4 4  Ankle inversion    Ankle eversion    (Blank rows = not tested)   GAIT: EVAL Gait pattern: decreased step length- Right, decreased step length- Left, decreased stance time- Right, decreased stance time- Left, decreased stride length, shuffling, ataxic,  trendelenburg, decreased trunk rotation, narrow BOS, poor foot clearance- Right, and poor foot clearance- Left Distance walked: in clinic distances Assistive device utilized: Single point cane Level of assistance: CGA and Min A Comments: walks with SPC, needs CGA and minA for longer distances due to unsafe gait pattern and high fall risk  FUNCTIONAL TESTs:  EVAL 5 times sit to stand: attempted but unable to complete Timed up and go (TUG): 33.65 w/SPC and modA to stand Berg Balance Scale: 15/56   TODAY'S TREATMENT:  08/26/21 Nustep L3 x64mins UE and LE    RedTB rows and ext 2x10 HS curls 15# 2x10 Crunches w/black band 2x10 Seated twists with greenTB 2x10 Ab crunches against white wedge holding out red ball 2x10 Pallof press w/greenTB 2x10    08/24/21 Nustep L3 x61mins Ambulating w/o AD 2 laps minA Side stepping over obstacles  Knee ext 5# 2x10 HS curls 10# x10, 15# x10 Shoulder ext 5# 2x10 Standing marches 2.5# 2x10, minA   08/17/21 Recumbent bike L2 x 6 min Ambulate 1 lap with cane with SBA, then 3 laps without AD and CGA LAQ 2.5# 3 x10 Standing marches with 2.5# 2x10 Step ups onto airex with HHA, 2x10 each, cues to increase hip flexion Sidestepping onto airex 1x10 B SLR 2x10 B Ball squeeze x10 Bridge with ball squeeze 2x10   08/12/21 Nustep L1x6 mins  Fitter 2x10 bilat  Heel taps 4" with min-modA HS curls greenTB  LAQ 2.5# 3 x10 Standing marches 2.5# 2x10 SLR 2x10 Bridges 2x10 Banded fall outs redTB x10 bilat   08/10/21 Nustep L1x43mins  Bike L1 x68mins  Standing marches 2# LAQ 2# 2x10 2 way hip 2# 2x10 Heel raises 2# 2x12   08/05/21 Nustep L1 x44mins HS curls redTB 2x10 Ankle redTB x10 each way both sides LAQ 1# 2x10 Seated marches 1# 2x10 each side Seated OHP 2x10 Seated leg press against fitter, low resistance 2x10 each side  Heel raises 2x12 with 2HHA    Seated banded abd greenTB 2x10   Ball squeezes 2x10, 3s holds  08/04/21 NuStep L1 x6 min   Hamstring curls red 2x10 LAQ 1lb 2x10 Seated march 1lb 2x10    Sit to stand x3 heavy UE use    Standing march w/ SPC 2x10    Ball squeezes 2x10   PATIENT EDUCATION: Education details: POC Person educated: Patient and Child(ren) Education method: Explanation Education comprehension: verbalized understanding   HOME EXERCISE PROGRAM: TBD  GOALS: Goals reviewed with patient? No  SHORT TERM GOALS: Target date: 09/02/21  Patient will be independent with initial HEP. Goal status: INITIAL  2.  Patient will be educated on strategies to decrease risk of falls.  Goal status: INITIAL   LONG TERM GOALS: Target date: 10/07/21   1.  Patient will be able to ambulate 600' with LRAD with good safety to access community.  Baseline: using SPC, decreased safety awareness and increased fall risk Goal status: Progressing   2.  Patient will demonstrate improved functional LE strength as demonstrated by an increase by 1 in all muscle groups. Goal status: INITIAL  3.   Patient will score 25 on Berg Balance test to demonstrate lower risk of falls. (MCID= 8 points) .  Baseline: 15 Goal status: INITIAL  4. Patient will demonstrate decreased fall risk by scoring < 25 sec on TUG. Baseline: 33.65 Goal status: INITIAL    ASSESSMENT:  CLINICAL IMPRESSION: Patient returns to PT feeling sore and having pain in knees and low back. We continued with LE strengthening and added in core exercises today. She continues to have difficulty with transfers and STS as well as gait and presents with unsteadiness. Unable to complete knee extension on machine due to R knee pain, we deferred for today. Also attempted mini squats but patient unable to do with proper form and without requiring mod-max assistance to stand back up. Continue with general overall strengthening to decrease fall risk and increase mobility and ease with ADLs.    OBJECTIVE IMPAIRMENTS Abnormal gait, decreased balance, decreased endurance,  decreased mobility, difficulty walking, decreased strength, decreased safety awareness, improper body mechanics, postural dysfunction, and pain.   ACTIVITY LIMITATIONS carrying, lifting, bending, sitting, standing, squatting, stairs, transfers, and locomotion level  PARTICIPATION LIMITATIONS: cleaning, laundry, shopping, and community activity  PERSONAL FACTORS Age, Behavior pattern, Past/current experiences, and 3+ comorbidities: OA, cardiovascular disease, frequent falls, CHF, SLE  are also affecting patient's functional outcome.   REHAB POTENTIAL: Fair    CLINICAL DECISION MAKING: Evolving/moderate complexity  EVALUATION COMPLEXITY: Moderate  PLAN: PT FREQUENCY: 2x/week  PT DURATION: 10 weeks  PLANNED INTERVENTIONS: Therapeutic exercises, Therapeutic activity, Neuromuscular re-education, Balance training, Gait training, Patient/Family education, Joint mobilization, Cryotherapy, Moist heat, Taping, Vasopneumatic device, Ionotophoresis 4mg /ml Dexamethasone, Manual therapy, and Re-evaluation  PLAN FOR NEXT SESSION: Strengthening and balance training.    Garrett, PT 08/26/2021, 5:45 PM

## 2021-08-31 ENCOUNTER — Other Ambulatory Visit: Payer: Self-pay | Admitting: Internal Medicine

## 2021-08-31 DIAGNOSIS — M1A09X0 Idiopathic chronic gout, multiple sites, without tophus (tophi): Secondary | ICD-10-CM

## 2021-09-01 ENCOUNTER — Other Ambulatory Visit: Payer: Self-pay | Admitting: Internal Medicine

## 2021-09-07 ENCOUNTER — Encounter: Payer: Self-pay | Admitting: Physical Therapy

## 2021-09-07 ENCOUNTER — Ambulatory Visit: Payer: Medicare HMO | Admitting: Physical Therapy

## 2021-09-07 DIAGNOSIS — M6281 Muscle weakness (generalized): Secondary | ICD-10-CM

## 2021-09-07 DIAGNOSIS — R293 Abnormal posture: Secondary | ICD-10-CM

## 2021-09-07 DIAGNOSIS — R2689 Other abnormalities of gait and mobility: Secondary | ICD-10-CM

## 2021-09-07 DIAGNOSIS — R262 Difficulty in walking, not elsewhere classified: Secondary | ICD-10-CM

## 2021-09-07 DIAGNOSIS — R296 Repeated falls: Secondary | ICD-10-CM

## 2021-09-07 NOTE — Therapy (Signed)
OUTPATIENT PHYSICAL THERAPY NEURO TREATMENT   Patient Name: Lindsey Huber MRN: [id number] DOB:1945/11/24, 76 y.o., female Today's Date: 09/07/2021   PCP: Evangeline Dakin REFERRING PROVIDER: Sheliah Hatch   PT End of Session - 09/07/21 1803     Visit Number 9    Date for PT Re-Evaluation 10/07/21    Progress Note Due on Visit 10    PT Start Time 1759    PT Stop Time 1840    PT Time Calculation (min) 41 min    Equipment Utilized During Treatment Gait belt    Activity Tolerance Patient tolerated treatment well;Patient limited by fatigue    Behavior During Therapy [facility] for tasks assessed/performed                  Past Medical History:  Diagnosis Date   Achalasia    Acquired hypothyroidism 08/20/2020   Acute metabolic encephalopathy 07/25/2021   AICD (automatic cardioverter/defibrillator) present 11/14/2019   Allergic rhinitis    Aortic valve disorder 03/26/2002   Atherosclerosis of abdominal aorta 05/01/2020   Cardiomyopathy    CHB (complete heart block) 08/18/2017   CHF (congestive heart failure)    Cholelithiasis without obstruction 05/01/2020   Chronic gouty arthritis    Chronic heart failure with preserved ejection fraction 05/01/2020   Chronic kidney disease (CKD), active medical management without dialysis, stage 3 (moderate) 05/01/2020   Closed torus fracture of distal end of right radius with delayed healing 08/12/2021   Delusional thoughts    Diverticulosis of colon 05/01/2020   Epistaxis    Essential (primary) hypertension 08/18/2017   Gastroesophageal reflux disease 05/01/2020   Gastrointestinal hemorrhage    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 11/14/2019   Hypercoagulability due to atrial fibrillation 05/01/2020   Hyperlipidemia 11/14/2019   Hypocalcemia 12/14/2020   Hypoglycemia    Hypokalemia 12/14/2020   Hypomagnesemia 12/14/2020   Hypotension 12/14/2020   Idiopathic pulmonary fibrosis 05/01/2020    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   Iron deficiency anemia    Long term (current) use of anticoagulants    Malnutrition of mild degree [name]: 75% to less than 90% of standard weight)    Mild dementia 08/12/2021   Mitral and aortic incompetence 11/14/2019   Non-rheumatic atrial fibrillation 05/01/2020   Non-toxic multinodular goiter 08/20/2020   NSVT (nonsustained ventricular tachycardia) 08/18/2017   Oropharyngeal dysphagia    Osteoarthritis of hip 05/01/2020   Osteoarthritis of knee 05/01/2020   Osteopenia of neck of left femur    Pain of left hip joint 12/12/2017   Proteinuria 05/01/2020   Raynaud's disease    Recurrent falls 04/09/2021   Rheumatoid arthritis    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   Septic shock 03/10/2020   Slow transit constipation    Systemic lupus erythematosus 05/01/2020   Thrombophilia    Transient ischemic attack    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   Vitamin D deficiency    Past Surgical History:  Procedure Laterality Date   BREAST BIOPSY Left    CARDIAC VALVE SURGERY     CARPAL TUNNEL RELEASE     COLONOSCOPY WITH PROPOFOL N/A 12/20/2020   Procedure: COLONOSCOPY WITH PROPOFOL;  Surgeon: Willis Modena, MD;  Location: [facility] ENDOSCOPY;  Service: Endoscopy;  Laterality: N/A;   HEMORRHOID SURGERY     PACEMAKER INSERTION     RIGHT/LEFT HEART CATH AND CORONARY ANGIOGRAPHY N/A 11/27/2020   Procedure: RIGHT/LEFT HEART CATH AND CORONARY ANGIOGRAPHY;  Surgeon: Dolores Patty, MD;  Location: [facility] INVASIVE CV  LAB;  Service: Cardiovascular;  Laterality: N/A;   TONSILLECTOMY     Patient Active Problem List   Diagnosis Date Noted   Mild dementia 08/12/2021   Allergic rhinitis    Chronic gouty arthritis    Epistaxis    Hypoglycemia    Iron deficiency anemia    Long term (current) use of anticoagulants    Malnutrition of mild degree [name]: 75% to less than 90% of standard weight)    Oropharyngeal dysphagia     Osteopenia of neck of left femur    Raynaud's disease    Rheumatoid arthritis    Transient ischemic attack    Slow transit constipation    Thrombophilia    Achalasia    Delusional thoughts    Recurrent falls 04/09/2021   Hypotension 12/14/2020   Hypokalemia 12/14/2020   Hypocalcemia 12/14/2020   Hypomagnesemia 12/14/2020   Non-toxic multinodular goiter 08/20/2020   Acquired hypothyroidism 08/20/2020   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   Vitamin D deficiency 05/01/2020   Proteinuria 05/01/2020   Osteoarthritis of knee 05/01/2020   Osteoarthritis of hip 05/01/2020   Gastroesophageal reflux disease 05/01/2020   Idiopathic pulmonary fibrosis 05/01/2020   Cardiomyopathy 05/01/2020   Hypercoagulability due to atrial fibrillation 05/01/2020   Systemic lupus erythematosus 05/01/2020   Chronic heart failure with preserved ejection fraction 05/01/2020   Chronic kidney disease (CKD), active medical management without dialysis, stage 3 (moderate) 05/01/2020   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   Non-rheumatic atrial fibrillation 05/01/2020   Atherosclerosis of abdominal aorta 05/01/2020   Diverticulosis of colon 05/01/2020   Cholelithiasis without obstruction 05/01/2020   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   AICD (automatic cardioverter/defibrillator) present 11/14/2019   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 11/14/2019   Mitral and aortic incompetence 11/14/2019   Hyperlipidemia 11/14/2019   Pain of left hip joint 12/12/2017   Essential (primary) hypertension 08/18/2017   CHB (complete heart block) 08/18/2017   NSVT (nonsustained ventricular tachycardia) 08/18/2017   Aortic valve disorder 03/26/2002    ONSET DATE: 06/18/21  REFERRING DIAG: R29.6  THERAPY DIAG:  Muscle weakness (generalized)  Repeated falls  Difficulty in walking, not elsewhere classified  Abnormal posture  Other abnormalities of  gait and mobility  Rationale for Evaluation and Treatment Rehabilitation  SUBJECTIVE:                                                                                                                                                                                              SUBJECTIVE STATEMENT: Patient  reports that she was very sore after last treatment, feels like it was  probably muscle soreness. She is still feeling some soreness, but much better.  Pt accompanied by: family member  PERTINENT HISTORY: she has a pacemaker, CVD, angiographs 2022, systemic lupus, CHF   PAIN:  Are you having pain? No  PRECAUTIONS: Fall  WEIGHT BEARING RESTRICTIONS No  FALLS: Has patient fallen in last 6 months? Yes. Number of falls maybe 4  LIVING ENVIRONMENT: Lives with: lives with their family Lives in: House/apartment Stairs: No Has following equipment at home: Single point cane, Environmental consultant - 2 wheeled, Wheelchair (manual), Electronics engineer, and Grab bars  PLOF: Independent with basic ADLs, Independent with household mobility with device, and Independent with community mobility with device  PATIENT GOALS able to walk better and safer  OBJECTIVE:   COORDINATION: Decreased coordination with walking and balance   POSTURE: rounded shoulders, forward head, increased thoracic kyphosis, anterior pelvic tilt, and flexed trunk   LOWER EXTREMITY ROM:   WFL limited due to weakness and coordination, but she is able to walk with modified IND  LOWER EXTREMITY MMT:    MMT Right Eval Left Eval  Hip flexion 3 3  Hip extension    Hip abduction    Hip adduction    Hip internal rotation    Hip external rotation    Knee flexion 2+ 2 w/pain  Knee extension 2- w/pain 2 w/pain  Ankle dorsiflexion 3+ 3+  Ankle plantarflexion 4 4  Ankle inversion    Ankle eversion    (Blank rows = not tested)   GAIT: EVAL Gait pattern: decreased step length- Right, decreased step length- Left, decreased stance time-  Right, decreased stance time- Left, decreased stride length, shuffling, ataxic, trendelenburg, decreased trunk rotation, narrow BOS, poor foot clearance- Right, and poor foot clearance- Left Distance walked: in clinic distances Assistive device utilized: Single point cane Level of assistance: CGA and Min A Comments: walks with SPC, needs CGA and minA for longer distances due to unsafe gait pattern and high fall risk  FUNCTIONAL TESTs:  EVAL 5 times sit to stand: attempted but unable to complete Timed up and go (TUG): 33.65 w/SPC and modA to stand Berg Balance Scale: 15/56   TODAY'S TREATMENT:  09/07/21 NuStep L3 x 6 minutes U and LE Supine exercises-AP, QS, HS, Abd, Lower Trunk rotation, bridge 10 reps each Standing hip abd with 1# resistance, straight cane for support, 10 reps each, min A for balance as she progressed. Seated hip flexion with 1# weight 2 x 5 reps each. Standing heel raises 1 x 10 reps, BUE support Seated rows and shoulder ext with Red Tband, upright posture, 10 reps each  08/26/21 Nustep L3 x62mins UE and LE    RedTB rows and ext 2x10 HS curls 15# 2x10 Crunches w/black band 2x10 Seated twists with greenTB 2x10 Ab crunches against white wedge holding out red ball 2x10 Pallof press w/greenTB 2x10    08/24/21 Nustep L3 x8mins Ambulating w/o AD 2 laps minA Side stepping over obstacles  Knee ext 5# 2x10 HS curls 10# x10, 15# x10 Shoulder ext 5# 2x10 Standing marches 2.5# 2x10, minA   08/17/21 Recumbent bike L2 x 6 min Ambulate 1 lap with cane with SBA, then 3 laps without AD and CGA LAQ 2.5# 3 x10 Standing marches with 2.5# 2x10 Step ups onto airex with HHA, 2x10 each, cues to increase hip flexion Sidestepping onto airex 1x10 B SLR 2x10 B Ball squeeze x10 Bridge with ball squeeze 2x10   08/12/21 Nustep L1x6 mins  Fitter 2x10 bilat  Heel  taps 4" with min-modA HS curls greenTB  LAQ 2.5# 3 x10 Standing marches 2.5# 2x10 SLR 2x10 Bridges 2x10 Banded  fall outs redTB x10 bilat   08/10/21 Nustep L1x31mins  Bike L1 x62mins  Standing marches 2# LAQ 2# 2x10 2 way hip 2# 2x10 Heel raises 2# 2x12   08/05/21 Nustep L1 x29mins HS curls redTB 2x10 Ankle redTB x10 each way both sides LAQ 1# 2x10 Seated marches 1# 2x10 each side Seated OHP 2x10 Seated leg press against fitter, low resistance 2x10 each side     Heel raises 2x12 with 2HHA    Seated banded abd greenTB 2x10   Ball squeezes 2x10, 3s holds  08/04/21 NuStep L1 x6 min  Hamstring curls red 2x10 LAQ 1lb 2x10 Seated march 1lb 2x10    Sit to stand x3 heavy UE use    Standing march w/ SPC 2x10    Ball squeezes 2x10   PATIENT EDUCATION: Education details: POC Person educated: Patient and Child(ren) Education method: Explanation Education comprehension: verbalized understanding   HOME EXERCISE PROGRAM: [id number]  GOALS: Goals reviewed with patient? No  SHORT TERM GOALS: Target date: 09/10/21  Patient will be independent with initial HEP. Goal status: ongoing  2.  Patient will be educated on strategies to decrease risk of falls.  Goal status: INITIAL   LONG TERM GOALS: Target date: 10/07/21   1.  Patient will be able to ambulate 600' with LRAD with good safety to access community.  Baseline: using SPC, decreased safety awareness and increased fall risk Goal status: Progressing   2.  Patient will demonstrate improved functional LE strength as demonstrated by an increase by 1 in all muscle groups. Goal status: INITIAL  3.   Patient will score 25 on Berg Balance test to demonstrate lower risk of falls. (MCID= 8 points) .  Baseline: 15 Goal status: INITIAL  4. Patient will demonstrate decreased fall risk by scoring < 25 sec on TUG. Baseline: 33.65 Goal status: INITIAL    ASSESSMENT:  CLINICAL IMPRESSION: Patient returns to PT feeling sore and having pain in knees and low back. She is feeling better. Initiated HEP, including supine exercises encouraging  patient to perform them in am prior to getting out of bed as she reports unsteadiness upon first standing. Rest breaks provided throughout session in order to improve her tolerance. She stated she felt better when leaving.    OBJECTIVE IMPAIRMENTS Abnormal gait, decreased balance, decreased endurance, decreased mobility, difficulty walking, decreased strength, decreased safety awareness, improper body mechanics, postural dysfunction, and pain.   ACTIVITY LIMITATIONS carrying, lifting, bending, sitting, standing, squatting, stairs, transfers, and locomotion level  PARTICIPATION LIMITATIONS: cleaning, laundry, shopping, and community activity  PERSONAL FACTORS Age, Behavior pattern, Past/current experiences, and 3+ comorbidities: OA, cardiovascular disease, frequent falls, CHF, SLE  are also affecting patient's functional outcome.   REHAB POTENTIAL: Fair    CLINICAL DECISION MAKING: Evolving/moderate complexity  EVALUATION COMPLEXITY: Moderate  PLAN: PT FREQUENCY: 2x/week  PT DURATION: 10 weeks  PLANNED INTERVENTIONS: Therapeutic exercises, Therapeutic activity, Neuromuscular re-education, Balance training, Gait training, Patient/Family education, Joint mobilization, Cryotherapy, Moist heat, Taping, Vasopneumatic device, Ionotophoresis 4mg /ml Dexamethasone, Manual therapy, and Re-evaluation  PLAN FOR NEXT SESSION: Strengthening and balance training.    Marcelina Morel, DPT 09/07/2021, 6:42 PM

## 2021-09-08 ENCOUNTER — Ambulatory Visit (INDEPENDENT_AMBULATORY_CARE_PROVIDER_SITE_OTHER): Payer: Medicare HMO | Admitting: Neurology

## 2021-09-08 ENCOUNTER — Encounter: Payer: Self-pay | Admitting: Neurology

## 2021-09-08 VITALS — BP 165/82 | HR 77 | Ht 63.0 in | Wt 125.0 lb

## 2021-09-08 DIAGNOSIS — R251 Tremor, unspecified: Secondary | ICD-10-CM

## 2021-09-08 DIAGNOSIS — R296 Repeated falls: Secondary | ICD-10-CM | POA: Diagnosis not present

## 2021-09-08 DIAGNOSIS — F0390 Unspecified dementia without behavioral disturbance: Secondary | ICD-10-CM

## 2021-09-08 NOTE — Progress Notes (Signed)
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  Pending results, may start rivastigmine vs donepezil Continue olanzapine 2.5mg  at bedtime Follow up in 6 months.     Subjective:  Lindsey Huber is a 76 year old right-handed female with a fib, cardiomyopathy, HTN, CHF, lupus, pulmonary fibrosis and CKD who follows up for TIA and neurocognitive disorder.  She is accompanied by her daughter who supplements history.   UPDATE: Neuropsychological evaluation on 08/12/2021 was consistent with major neurocognitive disorder possibly indicating early stages of Lewy body dementia or vascular dementia but not suggestive of Alzheimer's, FTD or Parkinson's/atypical parkinson's etiology.  Last visit, started olanzapine 2.5mg  at bedtime.  Hallucinations have been minimal.  Sleeping well.   HISTORY:  On 07/01/2020, the patient experienced a mild headache off and on during the day.  She also had a left temporal headache and noted intermittent numbness and tingling in the bilateral lower extremities.  That evening, she was talking with family when she suddenly started holding the right side of her face, eyes became big and she started drooling.  When her family spoke to her, she could only moan.  She was unable to repeat.  She could not smile but did not exhibit facial droop or unilateral weakness.  After 5-6 minutes, she started speaking but it was slurred and didn't make sense.  She was unable to repeat phrases.  She has no recollection of this.  No convulsions or incontinence  She was brought to the ED.  CTA of head and neck personally reviewed was negative for large  vessel occlusion or hemodynamically significant stenosis.  Unable to have an MRI due to pacemaker.  She has a fib which is treated with Xarelto.  Routine awake and asleep EEG on 07/14/2020 was normal.   On the morning of 03/14/2021, she was noted by her daughter to be acting strange.  When she spoke, she had trouble putting words together and did not make sense.  After about an hour, she became unresponsive, started staring off, making moaning sounds and exhibited head twitching and shaking of both hands.  Lasted 2-3 minutes.  No incontinence or tongue biting.  She continued having some slurred speech and difficulty getting words out afterwards.  In hindsight, she says that she does remember feeling strange during this event and struggling to talk.  No lateralizing symptoms such as facial droop or unilateral limb weakness.  EMS was called and symptoms resolved en route to the ED.  2 days prior, she had a fall and landed on her left arm.  CT head personally reviewed revealed no acute intracranial abnormality.  MRI was unable to be performed because there were no [employer] reps that day who could come to the hospital to turn off the pacemaker.  She was found to have cellulitis on her left medial forearm where she had fallen.  Labs revealed no leukocytes and electrolytes normal.  She was discharged on Keflex.     Beginning in 2022, her daughter has been concerned about dementia.  She has been having both visual and auditory hallucinations.  For example, she saw a large spider web in her house that she said was put up by somebody.  She sometimes hears people outside the window.  She also may exhibit paranoid delusions such as thinking her cousin is calling into radio stations and talking about her, or that her conversations are being broadcast throughout the apartment complex.  This isn't a frequent occurrence.  She sometimes has tremors of the hands.  She lives with her granddaughter and her daughter stops by  everyday.  She is able to perform ADLs independently.  No family history of dementia.  B12 from November 2022 was 1,406.  TSH from January 2023 was 1.470.  24 hour ambulatory EEG on 4/5-04/30/2021 was normal.  No events captured.  Unable to get MRI of brain because she has reported abandoned leads.  CT head on 06/18/2021 and  07/14/2021 personally reviewed showed moderate chronic small vessel ischemic changes in the cerebral white matter but no acute findings.     PAST MEDICAL HISTORY: Past Medical History:  Diagnosis Date   Achalasia    Acquired hypothyroidism 123456   Acute metabolic encephalopathy 123456   AICD (automatic cardioverter/defibrillator) present 11/14/2019   Allergic rhinitis    Aortic valve disorder 03/26/2002   Atherosclerosis of abdominal aorta 05/01/2020   Cardiomyopathy    CHB (complete heart block) 08/18/2017   CHF (congestive heart failure)    Cholelithiasis without obstruction 05/01/2020   Chronic gouty arthritis    Chronic heart failure with preserved ejection fraction 05/01/2020   Chronic kidney disease (CKD), active medical management without dialysis, stage 3 (moderate) 05/01/2020   Closed torus fracture of distal end of right radius with delayed healing 08/12/2021   Delusional thoughts    Diverticulosis of colon 05/01/2020   Epistaxis    Essential (primary) hypertension 08/18/2017   Gastroesophageal reflux disease 05/01/2020   Gastrointestinal hemorrhage    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 11/14/2019   Hypercoagulability due to atrial fibrillation 05/01/2020   Hyperlipidemia 11/14/2019   Hypocalcemia 12/14/2020   Hypoglycemia    Hypokalemia 12/14/2020   Hypomagnesemia 12/14/2020   Hypotension 12/14/2020   Idiopathic pulmonary fibrosis 05/01/2020   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   Iron deficiency anemia    Long term (current) use of anticoagulants    Malnutrition of mild degree [name]: 75% to less than 90% of  standard weight)    Mild dementia 08/12/2021   Mitral and aortic incompetence 11/14/2019   Non-rheumatic atrial fibrillation 05/01/2020   Non-toxic multinodular goiter 08/20/2020   NSVT (nonsustained ventricular tachycardia) 08/18/2017   Oropharyngeal dysphagia    Osteoarthritis of hip 05/01/2020   Osteoarthritis of knee 05/01/2020   Osteopenia of neck of left femur    Pain of left hip joint 12/12/2017   Proteinuria 05/01/2020   Raynaud's disease    Recurrent falls 04/09/2021   Rheumatoid arthritis    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     albuterol (VENTOLIN HFA) 108 (90 Base) MCG/ACT inhaler Inhale 2 puffs into the lungs every 6 (six) hours as needed for wheezing or shortness of breath.     allopurinol (ZYLOPRIM) 100 MG tablet TAKE 1 TABLET BY  MOUTH EVERY DAY (Patient taking differently: Take 100 mg by mouth daily.) 90 tablet 0   Budeson-Glycopyrrol-Formoterol (BREZTRI AEROSPHERE) 160-9-4.8 MCG/ACT AERO Inhale 2 puffs into the lungs 2 (two) times daily as needed ("for flares").     Cholecalciferol (VITAMIN D3) 25 MCG (1000 UT) CAPS Take 1,000 Units by mouth daily with lunch.     famotidine (PEPCID) 20 MG tablet Take 20 mg by mouth at bedtime as needed for heartburn or indigestion.     ferrous sulfate 325 (65 FE) MG tablet Take 325 mg by mouth See admin instructions. Take 325 mg by mouth with breakfast and supper     furosemide (LASIX) 20 MG tablet Take 20 mg by mouth See admin instructions. Take one tablet (20 mg) by mouth every  morning, Hold if BP is lower than 110/60 (Patient not taking: Reported on 07/25/2021)     furosemide (LASIX) 40 MG tablet Take 40 mg by mouth See admin instructions. Take 40 mg by mouth in the morning (scheduled) and an additional 40 mg at bedtime if swelling is present. HOLD IF SYSTOLIC READING IS <110 OR DIASTOLIC READING IS <60.     hydroxychloroquine (PLAQUENIL) 200 MG tablet Take 1 tablet (200 mg total) by mouth daily. 90 tablet 1   levothyroxine (SYNTHROID) 25 MCG tablet TAKE 1 TABLET BY MOUTH EVERY DAY 90 tablet 0   metoprolol succinate (TOPROL-XL) 25 MG 24 hr tablet Take 1 tablet (25 mg total) by mouth daily. Hold dose if <110 SBP or <60 DBP (Patient taking differently: Take 25 mg by mouth See admin instructions. Take 25 mg by mouth once a day and HOLD IF systolic reading is <110 OR diastolic reading is <60) 90 tablet 3   Multiple Vitamins-Minerals (MULTIVITAMIN WITH MINERALS) tablet Take 1 tablet by mouth daily with lunch.     Nintedanib (OFEV) 150 MG CAPS Take 150 mg by mouth at bedtime.     OLANZapine (ZYPREXA) 2.5 MG tablet Take 1 tablet (2.5 mg total) by mouth at bedtime. (Patient not taking: Reported on 07/25/2021) 30 tablet 5   predniSONE (DELTASONE) 5 MG tablet Take 1 tablet (5 mg total) by mouth daily with breakfast. Patient to double up dose during sick day rule 100 tablet 3   Rivaroxaban (XARELTO) 15 MG TABS tablet Take 1 tablet (15 mg total) by mouth daily with supper. 90 tablet 3   vitamin B-12 (CYANOCOBALAMIN) 1000 MCG tablet Take 1,000 mcg by mouth daily with lunch.     No current facility-administered medications on file prior to visit.    ALLERGIES: Allergies  Allergen Reactions   Propofol Other (See Comments)    "Caused asthma"   Flecainide Other (See Comments)    Reaction not recalled   Amiodarone Other (See Comments)    Delirium/Confusion/Psychosis   Amlodipine Other (See Comments)    "Tired and syncope"    FAMILY HISTORY: Family History  Problem Relation Age of  Onset   Heart disease Mother    Hypertension Mother    Rheum arthritis Mother    Osteoarthritis Mother    Heart disease Father    Hypertension Father    Osteoarthritis Father    Heart disease Sister    Diabetes Brother    Cancer Brother       Objective:  Blood pressure (!) 165/82, pulse 77, height 5\' 3"  (1.6 m), weight 125 lb (56.7 kg), SpO2 97 %. General: No acute distress.  Patient appears well-groomed.      , DO  CC:  Evangeline Dakin, [name]

## 2021-09-09 ENCOUNTER — Ambulatory Visit: Payer: Medicare HMO

## 2021-09-12 ENCOUNTER — Other Ambulatory Visit: Payer: Self-pay | Admitting: Neurology

## 2021-09-14 NOTE — Progress Notes (Signed)
Remote ICD transmission.   

## 2021-09-15 ENCOUNTER — Ambulatory Visit: Payer: Medicare HMO

## 2021-09-17 ENCOUNTER — Ambulatory Visit: Payer: Medicare HMO

## 2021-09-21 ENCOUNTER — Ambulatory Visit: Payer: Medicare HMO | Admitting: Physical Therapy

## 2021-09-23 ENCOUNTER — Ambulatory Visit: Payer: Medicare HMO

## 2021-09-23 DIAGNOSIS — R293 Abnormal posture: Secondary | ICD-10-CM

## 2021-09-23 DIAGNOSIS — R296 Repeated falls: Secondary | ICD-10-CM

## 2021-09-23 DIAGNOSIS — M6281 Muscle weakness (generalized): Secondary | ICD-10-CM

## 2021-09-23 DIAGNOSIS — R2689 Other abnormalities of gait and mobility: Secondary | ICD-10-CM

## 2021-09-23 DIAGNOSIS — R262 Difficulty in walking, not elsewhere classified: Secondary | ICD-10-CM

## 2021-09-23 NOTE — Therapy (Signed)
OUTPATIENT PHYSICAL THERAPY NEURO TREATMENT   Patient Name: Lindsey Huber MRN: [id number] DOB:1945-10-08, 76 y.o., female Today's Date: 09/23/2021   PCP: Fredda Hammed REFERRING PROVIDER: Vernelle Emerald   PT End of Session - 09/23/21 1708     Visit Number 10    Date for PT Re-Evaluation 10/07/21    PT Start Time 1709    PT Stop Time 5638    PT Time Calculation (min) 36 min    Equipment Utilized During Treatment --    Activity Tolerance Patient tolerated treatment well;Patient limited by fatigue    Behavior During Therapy [facility] for tasks assessed/performed              Progress Note Reporting Period 07/22/21 to 09/15/21  See note below for Objective Data and Assessment of Progress/Goals.         Past Medical History:  Diagnosis Date   Achalasia    Acquired hypothyroidism 93/73/4287   Acute metabolic encephalopathy 68/11/5724   AICD (automatic cardioverter/defibrillator) present 11/14/2019   Allergic rhinitis    Aortic valve disorder 03/26/2002   Atherosclerosis of abdominal aorta 05/01/2020   Cardiomyopathy    CHB (complete heart block) 08/18/2017   CHF (congestive heart failure)    Cholelithiasis without obstruction 05/01/2020   Chronic gouty arthritis    Chronic heart failure with preserved ejection fraction 05/01/2020   Chronic kidney disease (CKD), active medical management without dialysis, stage 3 (moderate) 05/01/2020   Closed torus fracture of distal end of right radius with delayed healing 08/12/2021   Delusional thoughts    Diverticulosis of colon 05/01/2020   Epistaxis    Essential (primary) hypertension 08/18/2017   Gastroesophageal reflux disease 05/01/2020   Gastrointestinal hemorrhage    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 11/14/2019   Hypercoagulability due to atrial fibrillation 05/01/2020   Hyperlipidemia 11/14/2019   Hypocalcemia 12/14/2020   Hypoglycemia    Hypokalemia 12/14/2020    Hypomagnesemia 12/14/2020   Hypotension 12/14/2020   Idiopathic pulmonary fibrosis 05/01/2020   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   Iron deficiency anemia    Long term (current) use of anticoagulants    Malnutrition of mild degree [name]: 75% to less than 90% of standard weight)    Mild dementia 08/12/2021   Mitral and aortic incompetence 11/14/2019   Non-rheumatic atrial fibrillation 05/01/2020   Non-toxic multinodular goiter 08/20/2020   NSVT (nonsustained ventricular tachycardia) 08/18/2017   Oropharyngeal dysphagia    Osteoarthritis of hip 05/01/2020   Osteoarthritis of knee 05/01/2020   Osteopenia of neck of left femur    Pain of left hip joint 12/12/2017   Proteinuria 05/01/2020   Raynaud's disease    Recurrent falls 04/09/2021   Rheumatoid arthritis    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   Septic shock 03/10/2020   Slow transit constipation    Systemic lupus erythematosus 05/01/2020   Thrombophilia    Transient ischemic attack    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   Vitamin D deficiency    Past Surgical History:  Procedure Laterality Date   BREAST BIOPSY Left    CARDIAC VALVE SURGERY     CARPAL TUNNEL RELEASE     COLONOSCOPY WITH PROPOFOL N/A 12/20/2020   Procedure: COLONOSCOPY WITH PROPOFOL;  Surgeon: Arta Silence, MD;  Location: [place];  Service: Endoscopy;  Laterality: N/A;   HEMORRHOID SURGERY     PACEMAKER INSERTION     RIGHT/LEFT HEART CATH AND CORONARY ANGIOGRAPHY N/A 11/27/2020   Procedure: RIGHT/LEFT HEART CATH AND CORONARY  ANGIOGRAPHY;  Surgeon: Jolaine Artist, MD;  Location: [place] [facility];  Service: Cardiovascular;  Laterality: N/A;   TONSILLECTOMY     Patient Active Problem List   Diagnosis Date Noted   Mild dementia 08/12/2021   Allergic rhinitis    Chronic gouty arthritis    Epistaxis    Hypoglycemia    Iron deficiency anemia    Long term (current) use of anticoagulants     Malnutrition of mild degree [name]: 75% to less than 90% of standard weight)    Oropharyngeal dysphagia    Osteopenia of neck of left femur    Raynaud's disease    Rheumatoid arthritis    Transient ischemic attack    Slow transit constipation    Thrombophilia    Achalasia    Delusional thoughts    Recurrent falls 04/09/2021   Hypotension 12/14/2020   Hypokalemia 12/14/2020   Hypocalcemia 12/14/2020   Hypomagnesemia 12/14/2020   Non-toxic multinodular goiter 08/20/2020   Acquired hypothyroidism 08/20/2020   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   Vitamin D deficiency 05/01/2020   Proteinuria 05/01/2020   Osteoarthritis of knee 05/01/2020   Osteoarthritis of hip 05/01/2020   Gastroesophageal reflux disease 05/01/2020   Idiopathic pulmonary fibrosis 05/01/2020   Cardiomyopathy 05/01/2020   Hypercoagulability due to atrial fibrillation 05/01/2020   Systemic lupus erythematosus 05/01/2020   Chronic heart failure with preserved ejection fraction 05/01/2020   Chronic kidney disease (CKD), active medical management without dialysis, stage 3 (moderate) 05/01/2020   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   Non-rheumatic atrial fibrillation 05/01/2020   Atherosclerosis of abdominal aorta 05/01/2020   Diverticulosis of colon 05/01/2020   Cholelithiasis without obstruction 05/01/2020   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   AICD (automatic cardioverter/defibrillator) present 11/14/2019   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 11/14/2019   Mitral and aortic incompetence 11/14/2019   Hyperlipidemia 11/14/2019   Pain of left hip joint 12/12/2017   Essential (primary) hypertension 08/18/2017   CHB (complete heart block) 08/18/2017   NSVT (nonsustained ventricular tachycardia) 08/18/2017   Aortic valve disorder 03/26/2002    ONSET DATE: 06/18/21  REFERRING DIAG: R29.6  THERAPY DIAG:  Muscle weakness  (generalized)  Difficulty in walking, not elsewhere classified  Repeated falls  Other abnormalities of gait and mobility  Abnormal posture  Rationale for Evaluation and Treatment Rehabilitation  SUBJECTIVE:                                                                                                                                                                                              SUBJECTIVE STATEMENT: I am doing  pretty good, I am getting better. I have had some COPD and asthma episodes, which is making it hard to breathe.   Pt accompanied by: family member  PERTINENT HISTORY: she has a pacemaker, CVD, angiographs 2022, systemic lupus, CHF   PAIN:  Are you having pain? No  PRECAUTIONS: Fall  WEIGHT BEARING RESTRICTIONS No  FALLS: Has patient fallen in last 6 months? Yes. Number of falls maybe 4  LIVING ENVIRONMENT: Lives with: lives with their family Lives in: House/apartment Stairs: No Has following equipment at home: Single point cane, Environmental consultant - 2 wheeled, Wheelchair (manual), Electronics engineer, and Grab bars  PLOF: Independent with basic ADLs, Independent with household mobility with device, and Independent with community mobility with device  PATIENT GOALS able to walk better and safer  OBJECTIVE:   COORDINATION: Decreased coordination with walking and balance   POSTURE: rounded shoulders, forward head, increased thoracic kyphosis, anterior pelvic tilt, and flexed trunk   LOWER EXTREMITY ROM:   WFL limited due to weakness and coordination, but she is able to walk with modified IND  LOWER EXTREMITY MMT:    MMT Right Eval Left Eval Right 09/23/21 Left  09/23/21  Hip flexion 3 3 4 4   Hip extension      Hip abduction      Hip adduction      Hip internal rotation      Hip external rotation      Knee flexion 2+ 2 w/pain 3 3  Knee extension 2- w/pain 2 w/pain 2+ pain in knee 2+ pain in knee  Ankle dorsiflexion 3+ 3+ 4 4  Ankle plantarflexion 4 4 5  5   Ankle inversion      Ankle eversion      (Blank rows = not tested)   GAIT: EVAL Gait pattern: decreased step length- Right, decreased step length- Left, decreased stance time- Right, decreased stance time- Left, decreased stride length, shuffling, ataxic, trendelenburg, decreased trunk rotation, narrow BOS, poor foot clearance- Right, and poor foot clearance- Left Distance walked: in clinic distances Assistive device utilized: Single point cane Level of assistance: CGA and Min A Comments: walks with SPC, needs CGA and minA for longer distances due to unsafe gait pattern and high fall risk  FUNCTIONAL TESTs:  EVAL 5 times sit to stand: attempted but unable to complete Timed up and go (TUG): 33.65 w/SPC and modA to stand Berg Balance Scale: 15/56   TODAY'S TREATMENT:  09/23/21 Progress note -MMT, TUG, BERG Nustep L5 x34mns  Leg ext 5# 2x10  HS curls 15#  Walking laps in gym   09/07/21 NuStep L3 x 6 minutes U and LE Supine exercises-AP, QS, HS, Abd, Lower Trunk rotation, bridge 10 reps each Standing hip abd with 1# resistance, straight cane for support, 10 reps each, min A for balance as she progressed. Seated hip flexion with 1# weight 2 x 5 reps each. Standing heel raises 1 x 10 reps, BUE support Seated rows and shoulder ext with Red Tband, upright posture, 10 reps each  08/26/21 Nustep L3 x631ms UE and LE    RedTB rows and ext 2x10 HS curls 15# 2x10 Crunches w/black band 2x10 Seated twists with greenTB 2x10 Ab crunches against white wedge holding out red ball 2x10 Pallof press w/greenTB 2x10    08/24/21 Nustep L3 x5m71m Ambulating w/o AD 2 laps minA Side stepping over obstacles  Knee ext 5# 2x10 HS curls 10# x10, 15# x10 Shoulder ext 5# 2x10 Standing marches 2.5# 2x10, minA   08/17/21  Recumbent bike L2 x 6 min Ambulate 1 lap with cane with SBA, then 3 laps without AD and CGA LAQ 2.5# 3 x10 Standing marches with 2.5# 2x10 Step ups onto airex with HHA, 2x10  each, cues to increase hip flexion Sidestepping onto airex 1x10 B SLR 2x10 B Ball squeeze x10 Bridge with ball squeeze 2x10   08/12/21 Nustep L1x6 mins  Fitter 2x10 bilat  Heel taps 4" with min-modA HS curls greenTB  LAQ 2.5# 3 x10 Standing marches 2.5# 2x10 SLR 2x10 Bridges 2x10 Banded fall outs redTB x10 bilat   08/10/21 Nustep L1x22mns  Bike L1 x43ms  Standing marches 2# LAQ 2# 2x10 2 way hip 2# 2x10 Heel raises 2# 2x12   08/05/21 Nustep L1 x6m55m HS curls redTB 2x10 Ankle redTB x10 each way both sides LAQ 1# 2x10 Seated marches 1# 2x10 each side Seated OHP 2x10 Seated leg press against fitter, low resistance 2x10 each side     Heel raises 2x12 with 2HHA    Seated banded abd greenTB 2x10   Ball squeezes 2x10, 3s holds  08/04/21 NuStep L1 x6 min  Hamstring curls red 2x10 LAQ 1lb 2x10 Seated march 1lb 2x10    Sit to stand x3 heavy UE use    Standing march w/ SPC 2x10    Ball squeezes 2x10   PATIENT EDUCATION: Education details: POC Person educated: Patient and Child(ren) Education method: Explanation Education comprehension: verbalized understanding   HOME EXERCISE PROGRAM: [id number]  GOALS: Goals reviewed with patient? No  SHORT TERM GOALS: Target date: 09/10/21  Patient will be independent with initial HEP. Goal status: ongoing  2.  Patient will be educated on strategies to decrease risk of falls.  Goal status: IN PROGRESS   LONG TERM GOALS: Target date: 10/07/21  1.  Patient will be able to ambulate 600' with LRAD with good safety to access community.  Baseline: using SPC, decreased safety awareness and increased fall risk Goal status: Progressing   2.  Patient will demonstrate improved functional LE strength as demonstrated by an increase by 1 in all muscle groups. Goal status: IN PROGRESS  3.   Patient will score 36 on Berg Balance test to demonstrate lower risk of falls. (MCID= 8 points) .  Baseline: 15, 8/30-32 Goal status: IN  PROGRESS  4. Patient will demonstrate decreased fall risk by scoring < 20 sec on TUG. Baseline: 33.65 w/SPC, 8/30- 21.7s w/o cane Goal status: progressing     ASSESSMENT:  CLINICAL IMPRESSION: Progress note today, patient has progressed significantly with strength and functional tests. Revised BERG and TUG goals as she met the original scores but will benefit from achieving even safer ranges to decrease fall risk. She is still unsteady and has decrease safety awareness especially with transfers and walking with sharp/small turns. We continued working on strength today. Daughter will call to schedule more appointments.    OBJECTIVE IMPAIRMENTS Abnormal gait, decreased balance, decreased endurance, decreased mobility, difficulty walking, decreased strength, decreased safety awareness, improper body mechanics, postural dysfunction, and pain.   ACTIVITY LIMITATIONS carrying, lifting, bending, sitting, standing, squatting, stairs, transfers, and locomotion level  PARTICIPATION LIMITATIONS: cleaning, laundry, shopping, and community activity  PERSONAL FACTORS Age, Behavior pattern, Past/current experiences, and 3+ comorbidities: OA, cardiovascular disease, frequent falls, CHF, SLE  are also affecting patient's functional outcome.   REHAB POTENTIAL: Fair    CLINICAL DECISION MAKING: Evolving/moderate complexity  EVALUATION COMPLEXITY: Moderate  PLAN: PT FREQUENCY: 2x/week  PT DURATION: 10 weeks  PLANNED INTERVENTIONS: Therapeutic exercises, Therapeutic  activity, Neuromuscular re-education, Balance training, Gait training, Patient/Family education, Joint mobilization, Cryotherapy, Moist heat, Taping, Vasopneumatic device, Ionotophoresis 70m/ml Dexamethasone, Manual therapy, and Re-evaluation  PLAN FOR NEXT SESSION: Strengthening and balance training.    SMarcelina Morel DPT 09/23/2021, 5:47 PM

## 2021-10-01 ENCOUNTER — Other Ambulatory Visit: Payer: Self-pay | Admitting: Neurology

## 2021-10-08 NOTE — Progress Notes (Deleted)
Office Visit Note  Patient: Lindsey Huber             Date of Birth: September 26, 1945           MRN: [id number]             PCP: Collene Mares, PA Referring: Collene Mares, [name] Visit Date: 10/19/2021   Subjective:  No chief complaint on file.   History of Present Illness: Lindsey Huber is a 76 y.o. female here for follow up for SLE, OA, and gouty arthritis   Previous HPI 07/16/2021  Lindsey Huber is a 76 y.o. female here for follow up for SLE, OA, and gouty arthritis on HCQ 200 mg daily and prednisone 5 mg and allopurinol 100 mg daily. No major flare up with lupus symptoms no joint effusion. She has persistent mild pedal edema. She fell again in May struck her right temple area on she side of the car and also chest and shoulder pain. No major complications identified. She has not yet started with PT for her weakness, pain, and gait difficulty.    Previous HPI 04/09/21 Lindsey Huber is a 76 y.o. female here for follow up for systemic lupus and gout on HCQ 200 mg daily and allopurinol 100 mg daily. She takes prednisone 5 mg daily as well for adrenal insufficiency. She has had multiple falls since last visit most recently about a week ago. This is most often from her leg typically right knee suddenly giving way without preceding signs or symptoms. She fell onto her right hip. Also fell recently landing on left elbow with residual bruising around that area. No new gout attacks or joint swelling besides from the bruising.   Previous HPI 01/05/21 Lindsey Huber is a 76 y.o. female here for follow up for SLE with inflammatory arthritis and gout on hydroxychloroquine 200 mg daily and allopurinol 100 mg. 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  Since our last visit she had a significant event with hospitalization for hematochezia work-up identified a diverticular bleed.  She did have sufficient anemia required blood transfusion  otherwise no major complications.  She has had no major change in pulmonary symptoms no new skin rashes or swelling.  Does have mild pedal edema.  Currently she is experiencing some increased knee pain and stiffness.   Previous HPI: 06/27/20 Lindsey Huber is a 76 y.o. female here to establish care for systemic lupus and gouty arthritis currently on hydroxychloroquine 200 mg p.o. daily and allopurinol 100 mg p.o. daily.  She was originally diagnosed due to symptoms of multiple joint pains with orthopedic surgery evaluation in [place] with additional work-up revealing for systemic lupus.  She has previously taken steroids and was on low-dose prednisone 5 mg daily for quite some time although discontinued at her most recent hospitalization.  She has had episodic gout flares involving both feet.  Lupus symptoms have been predominantly arthritis possibly a overlap with rheumatoid arthritis with no known history of lupus nephritis.  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  She has also had intra-articular steroid injection of the knees for arthritis symptoms with reasonably good benefit.  Her left knee has had some frequent slipping or instability reported she describes a fall last week despite routine use of a walker and sometimes knee brace for this.   No Rheumatology ROS completed.   PMFS History:  Patient Active Problem List  Diagnosis Date Noted   Mild dementia 08/12/2021   Allergic rhinitis    Chronic gouty arthritis    Epistaxis    Hypoglycemia    Iron deficiency anemia    Long term (current) use of anticoagulants    Malnutrition of mild degree [name]: 75% to less than 90% of standard weight)    Oropharyngeal dysphagia    Osteopenia of neck of left femur    Raynaud's disease    Rheumatoid arthritis    Transient ischemic attack    Slow transit constipation    Thrombophilia    Achalasia    Delusional thoughts    Recurrent falls  04/09/2021   Hypotension 12/14/2020   Hypokalemia 12/14/2020   Hypocalcemia 12/14/2020   Hypomagnesemia 12/14/2020   Non-toxic multinodular goiter 08/20/2020   Acquired hypothyroidism 08/20/2020   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   Vitamin D deficiency 05/01/2020   Proteinuria 05/01/2020   Osteoarthritis of knee 05/01/2020   Osteoarthritis of hip 05/01/2020   Gastroesophageal reflux disease 05/01/2020   Idiopathic pulmonary fibrosis 05/01/2020   Cardiomyopathy 05/01/2020   Hypercoagulability due to atrial fibrillation 05/01/2020   Systemic lupus erythematosus 05/01/2020   Chronic heart failure with preserved ejection fraction 05/01/2020   Chronic kidney disease (CKD), active medical management without dialysis, stage 3 (moderate) 05/01/2020   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   Non-rheumatic atrial fibrillation 05/01/2020   Atherosclerosis of abdominal aorta 05/01/2020   Diverticulosis of colon 05/01/2020   Cholelithiasis without obstruction 05/01/2020   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   AICD (automatic cardioverter/defibrillator) present 11/14/2019   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 11/14/2019   Mitral and aortic incompetence 11/14/2019   Hyperlipidemia 11/14/2019   Pain of left hip joint 12/12/2017   Essential (primary) hypertension 08/18/2017   CHB (complete heart block) 08/18/2017   NSVT (nonsustained ventricular tachycardia) 08/18/2017   Aortic valve disorder 03/26/2002    Past Medical History:  Diagnosis Date   Achalasia    Acquired hypothyroidism 123456   Acute metabolic encephalopathy 123456   AICD (automatic cardioverter/defibrillator) present 11/14/2019   Allergic rhinitis    Aortic valve disorder 03/26/2002   Atherosclerosis of abdominal aorta 05/01/2020   Cardiomyopathy    CHB (complete heart block) 08/18/2017   CHF (congestive heart failure)    Cholelithiasis  without obstruction 05/01/2020   Chronic gouty arthritis    Chronic heart failure with preserved ejection fraction 05/01/2020   Chronic kidney disease (CKD), active medical management without dialysis, stage 3 (moderate) 05/01/2020   Closed torus fracture of distal end of right radius with delayed healing 08/12/2021   Delusional thoughts    Diverticulosis of colon 05/01/2020   Epistaxis    Essential (primary) hypertension 08/18/2017   Gastroesophageal reflux disease 05/01/2020   Gastrointestinal hemorrhage    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 11/14/2019   Hypercoagulability due to atrial fibrillation 05/01/2020   Hyperlipidemia 11/14/2019   Hypocalcemia 12/14/2020   Hypoglycemia    Hypokalemia 12/14/2020   Hypomagnesemia 12/14/2020   Hypotension 12/14/2020   Idiopathic pulmonary fibrosis 05/01/2020   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   Iron deficiency anemia    Long term (current) use of anticoagulants    Malnutrition of mild degree [name]: 75% to less than 90% of standard weight)    Mild dementia 08/12/2021   Mitral and aortic incompetence 11/14/2019   Non-rheumatic atrial fibrillation 05/01/2020   Non-toxic multinodular goiter 08/20/2020   NSVT (nonsustained ventricular tachycardia) 08/18/2017   Oropharyngeal dysphagia  Osteoarthritis of hip 05/01/2020   Osteoarthritis of knee 05/01/2020   Osteopenia of neck of left femur    Pain of left hip joint 12/12/2017   Proteinuria 05/01/2020   Raynaud's disease    Recurrent falls 04/09/2021   Rheumatoid arthritis    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   Septic shock 03/10/2020   Slow transit constipation    Systemic lupus erythematosus 05/01/2020   Thrombophilia    Transient ischemic attack    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   Vitamin D deficiency     Family History  Problem Relation Age of Onset   Heart disease Mother    Hypertension Mother     Rheum arthritis Mother    Osteoarthritis Mother    Heart disease Father    Hypertension Father    Osteoarthritis Father    Heart disease Sister    Diabetes Brother    Cancer Brother    Past Surgical History:  Procedure Laterality Date   BREAST BIOPSY Left    CARDIAC VALVE SURGERY     CARPAL TUNNEL RELEASE     COLONOSCOPY WITH PROPOFOL N/A 12/20/2020   Procedure: COLONOSCOPY WITH PROPOFOL;  Surgeon: Willis Modena, MD;  Location: [facility] END[facility]COPY;  Service: Endoscopy;  Laterality: N/A;   HEMORRHOID SURGERY     PACEMAKER INSERTION     RIGHT/LEFT HEART CATH AND CORONARY ANGIOGRAPHY N/A 11/27/2020   Procedure: RIGHT/LEFT HEART CATH AND CORONARY ANGIOGRAPHY;  Surgeon: Dolores Patty, MD;  Location: [facility];  Service: Cardiovascular;  Laterality: N/A;   TONSILLECTOMY     Social History   Social History Narrative   Right Handed    Immunization History  Administered Date(s) Administered   Influenza-Unspecified 10/26/2019   PFIZER(Purple Top)SARS-COV-2 Vaccination 04/17/2019, 05/08/2019     Objective: Vital Signs: There were no vitals taken for this visit.   Physical Exam   Musculoskeletal Exam: ***  CDAI Exam: CDAI Score: -- Patient Global: --; Provider Global: -- Swollen: --; Tender: -- Joint Exam 10/19/2021   No joint exam has been documented for this visit   There is currently no information documented on the homunculus. Go to the Rheumatology activity and complete the homunculus joint exam.  Investigation: No additional findings.  Imaging: No results found.  Recent Labs: Lab Results  Component Value Date   WBC 5.5 07/30/2021   HGB 11.1 (L) 07/30/2021   PLT 149 (L) 07/30/2021   NA 140 07/30/2021   K 3.4 (L) 07/30/2021   CL 106 07/30/2021   CO2 25 07/30/2021   GLUCOSE 88 07/30/2021   BUN 26 (H) 07/30/2021   CREATININE 1.15 (H) 07/30/2021   BILITOT 0.9 07/30/2021   ALKPHOS 41 07/30/2021   AST 41 07/30/2021   ALT 23 07/30/2021   PROT  8.1 07/30/2021   ALBUMIN 3.7 07/30/2021   CALCIUM 8.9 07/30/2021    Speciality Comments: PLQ Eye Exam- 09/19/2020, nothing abnormal related to PLQ F/u in 12 months  Procedures:  No procedures performed Allergies: Propofol, Flecainide, Amiodarone, and Amlodipine   Assessment / Plan:     Visit Diagnoses: No diagnosis found.  ***  Orders: No orders of the defined types were placed in this encounter.  No orders of the defined types were placed in this encounter.    Follow-Up Instructions: No follow-ups on file.   Jairo Ben, RT  Note - This record has been created using Animal nutritionist.  Chart creation errors have been sought, but may not always  have been located. Such creation errors do not reflect on  the standard of medical care.

## 2021-10-15 NOTE — Progress Notes (Signed)
Office Visit Note  Patient: Lindsey Huber             Date of Birth: July 03, 1945           MRN: [id number]             PCP: Scheryl Marten, PA Referring: Scheryl Marten, [name] Visit Date: 10/26/2021   Subjective:  Follow-up (Doing good)   History of Present Illness: Lindsey Huber is a 76 y.o. female here for follow up for SLE, OA, and gouty arthritis on HCQ 200 mg PO daily and prednisone 5 mg daily and allopurinol 100 mg daily. She has not suffered any flare up of joint pain and swelling since our last visit. No new falls. She has morning stiffness increases somewhat with colder temperatures but keeps home pretty warm. Fingertips turn blue sometimes with cold but no pallor, redness, no skin peeling or lesions.  Previous HPI 07/16/2021 Lindsey Huber is a 76 y.o. female here for follow up for SLE, OA, and gouty arthritis on HCQ 200 mg daily and prednisone 5 mg and allopurinol 100 mg daily. No major flare up with lupus symptoms no joint effusion. She has persistent mild pedal edema. She fell again in May struck her right temple area on she side of the car and also chest and shoulder pain. No major complications identified. She has not yet started with PT for her weakness, pain, and gait difficulty.    Previous HPI 04/09/21 Lindsey Huber is a 76 y.o. female here for follow up for systemic lupus and gout on HCQ 200 mg daily and allopurinol 100 mg daily. She takes prednisone 5 mg daily as well for adrenal insufficiency. She has had multiple falls since last visit most recently about a week ago. This is most often from her leg typically right knee suddenly giving way without preceding signs or symptoms. She fell onto her right hip. Also fell recently landing on left elbow with residual bruising around that area. No new gout attacks or joint swelling besides from the bruising.   Previous HPI 01/05/21 Lindsey Huber is a 76 y.o. female here for follow up for SLE with inflammatory  arthritis and gout on hydroxychloroquine 200 mg daily and allopurinol 100 mg. 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  Since our last visit she had a significant event with hospitalization for hematochezia work-up identified a diverticular bleed.  She did have sufficient anemia required blood transfusion otherwise no major complications.  She has had no major change in pulmonary symptoms no new skin rashes or swelling.  Does have mild pedal edema.  Currently she is experiencing some increased knee pain and stiffness.   Previous HPI: 06/27/20 Lindsey Huber is a 76 y.o. female here to establish care for systemic lupus and gouty arthritis currently on hydroxychloroquine 200 mg p.o. daily and allopurinol 100 mg p.o. daily.  She was originally diagnosed due to symptoms of multiple joint pains with orthopedic surgery evaluation in [place] with additional work-up revealing for systemic lupus.  She has previously taken steroids and was on low-dose prednisone 5 mg daily for quite some time although discontinued at her most recent hospitalization.  She has had episodic gout flares involving both feet.  Lupus symptoms have been predominantly arthritis possibly a overlap with rheumatoid arthritis with no known history of lupus nephritis.  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  She has also had  intra-articular steroid injection of the knees for arthritis symptoms with reasonably good benefit.  Her left knee has had some frequent slipping or instability reported she describes a fall last week despite routine use of a walker and sometimes knee brace for this.   Review of Systems  Constitutional:  Positive for fatigue.  HENT:  Positive for mouth dryness. Negative for mouth sores.   Eyes:  Positive for dryness.  Respiratory:  Positive for shortness of breath.   Cardiovascular:  Positive for chest pain. Negative for  palpitations.  Gastrointestinal:  Positive for constipation. Negative for blood in stool and diarrhea.  Endocrine: Positive for increased urination.  Genitourinary:  Positive for involuntary urination.  Musculoskeletal:  Positive for joint pain, gait problem, joint pain, muscle weakness and morning stiffness. Negative for joint swelling, myalgias, muscle tenderness and myalgias.  Skin:  Positive for hair loss. Negative for color change, rash and sensitivity to sunlight.  Allergic/Immunologic: Negative for susceptible to infections.  Neurological:  Positive for headaches. Negative for dizziness.  Hematological:  Negative for swollen glands.  Psychiatric/Behavioral:  Negative for depressed mood and sleep disturbance. The patient is not nervous/anxious.     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   Allergic rhinitis    Chronic gouty arthritis    Epistaxis    Hypoglycemia    Iron deficiency anemia    Long term (current) use of anticoagulants    Malnutrition of mild degree [name]: 75% to less than 90% of standard weight)    Oropharyngeal dysphagia    Osteopenia of neck of left femur    Raynaud's disease    Rheumatoid arthritis    Transient ischemic attack    Slow transit constipation    Thrombophilia    Achalasia    Delusional thoughts    Recurrent falls 04/09/2021   Hypotension 12/14/2020   Hypokalemia 12/14/2020   Hypocalcemia 12/14/2020   Hypomagnesemia 12/14/2020   Non-toxic multinodular goiter 08/20/2020   Acquired hypothyroidism 08/20/2020   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   Vitamin D deficiency 05/01/2020   Proteinuria 05/01/2020   Osteoarthritis of knee 05/01/2020   Osteoarthritis of hip 05/01/2020   Gastroesophageal reflux disease 05/01/2020   Idiopathic pulmonary fibrosis 05/01/2020   Cardiomyopathy 05/01/2020   Hypercoagulability due to atrial fibrillation 05/01/2020   Systemic lupus erythematosus 05/01/2020    Chronic heart failure with preserved ejection fraction 05/01/2020   Chronic kidney disease (CKD), active medical management without dialysis, stage 3 (moderate) 05/01/2020   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   Non-rheumatic atrial fibrillation 05/01/2020   Atherosclerosis of abdominal aorta 05/01/2020   Diverticulosis of colon 05/01/2020   Cholelithiasis without obstruction 05/01/2020   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   AICD (automatic cardioverter/defibrillator) present 11/14/2019   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 11/14/2019   Mitral and aortic incompetence 11/14/2019   Hyperlipidemia 11/14/2019   Pain of left hip joint 12/12/2017   Essential (primary) hypertension 08/18/2017   CHB (complete heart block) 08/18/2017   NSVT (nonsustained ventricular tachycardia) 08/18/2017   Aortic valve disorder 03/26/2002    Past Medical History:  Diagnosis Date   Achalasia    Acquired hypothyroidism 123456   Acute metabolic encephalopathy 123456   AICD (automatic cardioverter/defibrillator) present 11/14/2019   Allergic rhinitis    Aortic valve disorder 03/26/2002   Atherosclerosis of abdominal aorta 05/01/2020   Cardiomyopathy    CHB (complete heart block) 08/18/2017   CHF (  congestive heart failure)    Cholelithiasis without obstruction 05/01/2020   Chronic gouty arthritis    Chronic heart failure with preserved ejection fraction 05/01/2020   Chronic kidney disease (CKD), active medical management without dialysis, stage 3 (moderate) 05/01/2020   Closed torus fracture of distal end of right radius with delayed healing 08/12/2021   Delusional thoughts    Diverticulosis of colon 05/01/2020   Epistaxis    Essential (primary) hypertension 08/18/2017   Gastroesophageal reflux disease 05/01/2020   Gastrointestinal hemorrhage    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  11/14/2019   Hypercoagulability due to atrial fibrillation 05/01/2020   Hyperlipidemia 11/14/2019   Hypocalcemia 12/14/2020   Hypoglycemia    Hypokalemia 12/14/2020   Hypomagnesemia 12/14/2020   Hypotension 12/14/2020   Idiopathic pulmonary fibrosis 05/01/2020   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   Iron deficiency anemia    Long term (current) use of anticoagulants    Malnutrition of mild degree [name]: 75% to less than 90% of standard weight)    Mild dementia 08/12/2021   Mitral and aortic incompetence 11/14/2019   Non-rheumatic atrial fibrillation 05/01/2020   Non-toxic multinodular goiter 08/20/2020   NSVT (nonsustained ventricular tachycardia) 08/18/2017   Oropharyngeal dysphagia    Osteoarthritis of hip 05/01/2020   Osteoarthritis of knee 05/01/2020   Osteopenia of neck of left femur    Pain of left hip joint 12/12/2017   Proteinuria 05/01/2020   Raynaud's disease    Recurrent falls 04/09/2021   Rheumatoid arthritis    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   Septic shock 03/10/2020   Slow transit constipation    Systemic lupus erythematosus 05/01/2020   Thrombophilia    Transient ischemic attack    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   Vitamin D deficiency     Family History  Problem Relation Age of Onset   Heart disease Mother    Hypertension Mother    Rheum arthritis Mother    Osteoarthritis Mother    Heart disease Father    Hypertension Father    Osteoarthritis Father    Heart disease Sister    Diabetes Brother    Cancer Brother    Past Surgical History:  Procedure Laterality Date   BREAST BIOPSY Left    CARDIAC VALVE SURGERY     CARPAL TUNNEL RELEASE     COLONOSCOPY WITH PROPOFOL N/A 12/20/2020   Procedure: COLONOSCOPY WITH PROPOFOL;  Surgeon: Willis Modena, MD;  Location[facility] ENDOSCOPY;  Service: Endoscopy;  Laterality: N/A;   HEMORRHOID SURGERY     PACEMAKER INSERTION     RIGHT/LEFT HEART CATH AND CORONARY  ANGIOGRAPHY N/A 11/27/2020   Procedure: RIGHT/LEFT HEART CATH AND CORONARY ANGIOGRAPHY;  Surgeon: Dolores Patty, MD;  Location: [facility] INVASIVE CV LAB;  Service: Cardiovascular;  Laterality: N/A;   TONSILLECTOMY     Social History   Social History Narrative   Right Handed    Immunization History  Administered Date(s) Administered   Influenza-Unspecified 10/26/2019   PFIZER(Purple Top)SARS-COV-2 Vaccination 04/17/2019, 05/08/2019     Objective: Vital Signs: BP 135/77 (BP Location: Left Arm, Patient Position: Sitting, Cuff Size: Normal)   Pulse 93   Resp 16   Ht 5\' 3"  (1.6 m)   Wt 124 lb (56.2 kg)   BMI 21.97 kg/m    Physical Exam HENT:     Right Ear: External ear normal.     Left Ear: External ear normal.     Mouth/Throat:     Mouth: Mucous membranes  are moist.     Pharynx: Oropharynx is clear.  Eyes:     Conjunctiva/sclera: Conjunctivae normal.  Cardiovascular:     Rate and Rhythm: Normal rate and regular rhythm.     Heart sounds: Murmur heard.  Pulmonary:     Effort: Pulmonary effort is normal.     Comments: Inspiratory crackles all lung fields Musculoskeletal:     Right lower leg: No edema.     Left lower leg: No edema.  Lymphadenopathy:     Cervical: No cervical adenopathy.  Skin:    General: Skin is warm and dry.     Findings: No rash.  Neurological:     Mental Status: She is alert.  Psychiatric:        Mood and Affect: Mood normal.      Musculoskeletal Exam:  Shoulders full ROM no tenderness or swelling Elbows full ROM no tenderness or swelling Wrists full ROM no tenderness or swelling Fingers 1st CMC joint squaring b/l with reducible MCP subluxation, no tenderness or palpable swelling Knees full ROM, patellofemoral crepitus present, medial tenderness to pressure, no palpable effusions   Investigation: No additional findings.  Imaging: No results found.  Recent Labs: Lab Results  Component Value Date   WBC 5.5 07/30/2021   HGB 11.1 (L)  07/30/2021   PLT 149 (L) 07/30/2021   NA 140 07/30/2021   K 3.4 (L) 07/30/2021   CL 106 07/30/2021   CO2 25 07/30/2021   GLUCOSE 88 07/30/2021   BUN 26 (H) 07/30/2021   CREATININE 1.15 (H) 07/30/2021   BILITOT 0.9 07/30/2021   ALKPHOS 41 07/30/2021   AST 41 07/30/2021   ALT 23 07/30/2021   PROT 8.1 07/30/2021   ALBUMIN 3.7 07/30/2021   CALCIUM 8.9 07/30/2021    Speciality Comments: PLQ Eye Exam- 09/19/2020, nothing abnormal related to PLQ F/u in 12 months  Procedures:  No procedures performed Allergies: Propofol, Flecainide, Amiodarone, and Amlodipine   Assessment / Plan:     Visit Diagnoses: Systemic lupus erythematosus, unspecified SLE type, unspecified organ involvement status ([place]) - Plan: Sedimentation rate, Protein / creatinine ratio, urine, CBC with Differential/Platelet, COMPLETE METABOLIC PANEL WITH GFR  Inflammatory symptoms appear well controlled, joint pain at this time looks more like OA symptoms. Checking sed rate and urine P/C ratio for disease activity monitoring. Plan to continue HCQ 200 mg PO daily and prednisone 5 mg daily.  High risk medication use - hydroxychloroquine 200 mg daily  and prednisone 5 mg daily  Checking CBC and CMP today, previous HCQ retinal toxicity monitoring looked fine last year needs updating. No problems with extra bruising no weight change.  Idiopathic chronic gout of multiple sites without tophus  No new episodes of gout attack, no tophi on exam today, tolerating allopurinol 100 mg daily without issue.  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HCC)  Symptoms appear fairly stable, some cough and dyspnea most often with exertion. On Ofev 150 mg.  Falls frequently  No new falls since our last visit, has worked with physical therapy repeatedly with good improvement.  Orders: Orders Placed This Encounter  Procedures   Sedimentation rate   Protein / creatinine ratio, urine   CBC with Differential/Platelet   COMPLETE METABOLIC PANEL  WITH GFR   No orders of the defined types were placed in this encounter.    Follow-Up Instructions: Return in about 6 months (around 04/27/2022) for SLE/gout/OA f/u 49mos.   Collier Salina, MD  Note - This record has been created using Dragon  software.  Chart creation errors have been sought, but may not always  have been located. Such creation errors do not reflect on  the standard of medical care.

## 2021-10-19 ENCOUNTER — Ambulatory Visit: Payer: Medicare HMO | Admitting: Internal Medicine

## 2021-10-19 DIAGNOSIS — J84112 Idiopathic pulmonary fibrosis: Secondary | ICD-10-CM

## 2021-10-19 DIAGNOSIS — M1A09X0 Idiopathic chronic gout, multiple sites, without tophus (tophi): Secondary | ICD-10-CM

## 2021-10-19 DIAGNOSIS — Z79899 Other long term (current) drug therapy: Secondary | ICD-10-CM

## 2021-10-19 DIAGNOSIS — M329 Systemic lupus erythematosus, unspecified: Secondary | ICD-10-CM

## 2021-10-19 DIAGNOSIS — R296 Repeated falls: Secondary | ICD-10-CM

## 2021-10-20 ENCOUNTER — Ambulatory Visit: Payer: Medicare HMO | Admitting: Internal Medicine

## 2021-10-26 ENCOUNTER — Encounter: Payer: Self-pay | Admitting: Internal Medicine

## 2021-10-26 ENCOUNTER — Ambulatory Visit: Payer: Medicare HMO | Attending: Internal Medicine | Admitting: Internal Medicine

## 2021-10-26 VITALS — BP 135/77 | HR 93 | Resp 16 | Ht 63.0 in | Wt 124.0 lb

## 2021-10-26 DIAGNOSIS — M1A09X0 Idiopathic chronic gout, multiple sites, without tophus (tophi): Secondary | ICD-10-CM | POA: Diagnosis not present

## 2021-10-26 DIAGNOSIS — Z79899 Other long term (current) drug therapy: Secondary | ICD-10-CM

## 2021-10-26 DIAGNOSIS — M329 Systemic lupus erythematosus, unspecified: Secondary | ICD-10-CM

## 2021-10-26 DIAGNOSIS — R296 Repeated falls: Secondary | ICD-10-CM

## 2021-10-26 DIAGNOSIS — J84112 Idiopathic pulmonary fibrosis: Secondary | ICD-10-CM | POA: Diagnosis not present

## 2021-10-27 LAB — COMPLETE METABOLIC PANEL WITH GFR
AG Ratio: 0.9 (calc) — ABNORMAL LOW (ref 1.0–2.5)
ALT: 15 U/L (ref 6–29)
AST: 26 U/L (ref 10–35)
Albumin: 3.6 g/dL (ref 3.6–5.1)
Alkaline phosphatase (APISO): 42 U/L (ref 37–153)
BUN/Creatinine Ratio: 31 (calc) — ABNORMAL HIGH (ref 6–22)
BUN: 27 mg/dL — ABNORMAL HIGH (ref 7–25)
CO2: 26 mmol/L (ref 20–32)
Calcium: 9.1 mg/dL (ref 8.6–10.4)
Chloride: 105 mmol/L (ref 98–110)
Creat: 0.88 mg/dL (ref 0.60–1.00)
Globulin: 4.1 g/dL (calc) — ABNORMAL HIGH (ref 1.9–3.7)
Glucose, Bld: 81 mg/dL (ref 65–99)
Potassium: 3.6 mmol/L (ref 3.5–5.3)
Sodium: 142 mmol/L (ref 135–146)
Total Bilirubin: 0.7 mg/dL (ref 0.2–1.2)
Total Protein: 7.7 g/dL (ref 6.1–8.1)
eGFR: 69 mL/min/{1.73_m2} (ref >=60)

## 2021-10-27 LAB — CBC WITH DIFFERENTIAL/PLATELET
Absolute Monocytes: 614 cells/uL (ref 200–950)
Basophils Absolute: 19 cells/uL (ref 0–200)
Basophils Relative: 0.2 %
Eosinophils Absolute: 140 cells/uL (ref 15–500)
Eosinophils Relative: 1.5 %
HCT: 33.2 % — ABNORMAL LOW (ref 35.0–45.0)
Hemoglobin: 10.9 g/dL — ABNORMAL LOW (ref 11.7–15.5)
Lymphs Abs: 1442 cells/uL (ref 850–3900)
MCH: 33.2 pg — ABNORMAL HIGH (ref 27.0–33.0)
MCHC: 32.8 g/dL (ref 32.0–36.0)
MCV: 101.2 fL — ABNORMAL HIGH (ref 80.0–100.0)
MPV: 10.8 fL (ref 7.5–12.5)
Monocytes Relative: 6.6 %
Neutro Abs: 7087 cells/uL (ref 1500–7800)
Neutrophils Relative %: 76.2 %
Platelets: 119 10*3/uL — ABNORMAL LOW (ref 140–400)
RBC: 3.28 10*6/uL — ABNORMAL LOW (ref 3.80–5.10)
RDW: 12.8 % (ref 11.0–15.0)
Total Lymphocyte: 15.5 %
WBC: 9.3 10*3/uL (ref 3.8–10.8)

## 2021-10-27 LAB — SEDIMENTATION RATE: Sed Rate: 45 mm/h — ABNORMAL HIGH (ref 0–30)

## 2021-10-27 LAB — PROTEIN / CREATININE RATIO, URINE
Creatinine, Urine: 171 mg/dL (ref 20–275)
Protein/Creat Ratio: 327 mg/g creat — ABNORMAL HIGH (ref 24–184)
Protein/Creatinine Ratio: 0.327 mg/mg creat — ABNORMAL HIGH (ref 0.024–0.184)
Total Protein, Urine: 56 mg/dL — ABNORMAL HIGH (ref 5–24)

## 2021-10-27 NOTE — Progress Notes (Signed)
We can continue with the current medications no new changes needed.  Has she had an eye exam this year or coming up soon for hydroxychloroquine?  Sed rate test is 45 mildly improved from last year that seems to match up with less inflammation on exam.  Urine test does not show any problems with protein.  Blood count shows a mildly low number of platelets these are the cells responsible for clotting.  But her numbers are about the same as where they have been a year ago.

## 2021-11-06 ENCOUNTER — Ambulatory Visit (INDEPENDENT_AMBULATORY_CARE_PROVIDER_SITE_OTHER): Payer: Medicare HMO | Admitting: Internal Medicine

## 2021-11-06 ENCOUNTER — Encounter: Payer: Self-pay | Admitting: Internal Medicine

## 2021-11-06 VITALS — BP 134/86 | HR 76 | Ht 63.0 in | Wt 124.0 lb

## 2021-11-06 DIAGNOSIS — E042 Nontoxic multinodular goiter: Secondary | ICD-10-CM | POA: Diagnosis not present

## 2021-11-06 DIAGNOSIS — E2749 Other adrenocortical insufficiency: Secondary | ICD-10-CM

## 2021-11-06 DIAGNOSIS — E039 Hypothyroidism, unspecified: Secondary | ICD-10-CM | POA: Diagnosis not present

## 2021-11-06 MED ORDER — PREDNISONE 5 MG PO TABS: 5.0000 mg | ORAL_TABLET | Freq: Every day | ORAL | 3 refills | Status: DC

## 2021-11-06 MED ORDER — LEVOTHYROXINE SODIUM 25 MCG PO TABS: 25.0000 ug | ORAL_TABLET | Freq: Every day | ORAL | 3 refills | Status: DC

## 2021-11-06 NOTE — Patient Instructions (Signed)
-   Prednisone 5 mg 1 tablet EVERY morning with Breakfast  - Continue Levothyroxine 25 mcg daily   ADRENAL INSUFFICIENCY SICK DAY RULES:  Should you face an extreme emotional or physical stress such as trauma, surgery or acute illness, this will require extra steroid coverage so that the body can meet that stress.   Without increasing the steroid dose you may experience severe weakness, headache, dizziness, nausea and vomiting and possibly a more serious deterioration in health.  Typically the dose of steroids will only need to be increased for a couple of days if you have an illness that is transient and managed in the community.   If you are unable to take/absorb an increased dose of steroids orally because of vomiting or diarrhea, you will urgently require steroid injections and should present to an Emergency Department.  The general advice for any serious illness is as follows: Double the normal daily steroid dose for up to 3 days if you have a temperature of more than 37.50C (99.45F) with signs of sickness, or severe emotional or physical distress Contact your primary care doctor and Endocrinologist if the illness worsens or it lasts for more than 3 days.  In cases of severe illness, urgent medical assistance should be promptly sought. If you experience vomiting/diarrhea or are unable to take steroids by mouth, please administer the Hydrocortisone injection kit and seek urgent medical help.

## 2021-11-06 NOTE — Progress Notes (Signed)
Name: Lindsey Huber  MRN/ DOB: [id number], 30-Sep-1945    Age/ Sex: 76 y.o., female    PCP: Scheryl Marten, [name]   Reason for Endocrinology Evaluation: MNG, adrenal insufficiency      Date of Initial Endocrinology Evaluation: 08/20/2020    HPI: Lindsey Huber is a 76 y.o. female with a past medical history of MNG, Gout, Adrenal Insufficiency, CHF, gout  and SLE. The patient presented for initial endocrinology clinic visit on 08/20/2020 for consultative assistance with her MNG/ adrenal Insufficiency .    Moved from [place]    MNG History:  Has been diagnosed with MNG years ago. She is not sure of thyroid biopsies in the past . Has been on LT-4 replacement daily    No Fh of thyroid disease   Thyroid ultrasound on 08/29/2020 revealing multinodular goiter with a peripherally calcified nodule in the left mid gland meets consensus criteria to consider fine-needle aspiration biopsy.   She is S/P benign FNA 04/2021    Adrenal History :  She was started on [facility] in 02/2020 following an admission for septic shock . Prior to that she was on Prednisone for years , she was on this though rheumatology for RA   ACTH was at the low end of normal at 6 PG/mL with low normal plasma cortisol at 3.7 UG/DL 07/2020    SUBJECTIVE:    Today (11/06/21):  Lindsey Huber is here for follow-up on multinodular goiter and secondary adrenal insufficiency She is accompanied by her granddaughter [place]  Has occasional left local neck swelling   Has occasional  palpitations  Had diarrhea last month  Has medical alert bracelet  Has acid reflux  heartburn and hoarseness    HOME ENDOCRINE MEDICATIONS : Prednisone 5 mg daily Levothyroxine 25 mcg daily     HISTORY:  Past Medical History:  Past Medical History:  Diagnosis Date   Achalasia    Acquired hypothyroidism 32/95/1884   Acute metabolic encephalopathy 16/60/6301   AICD (automatic cardioverter/defibrillator) present 11/14/2019   Allergic  rhinitis    Aortic valve disorder 03/26/2002   Atherosclerosis of abdominal aorta 05/01/2020   Cardiomyopathy    CHB (complete heart block) 08/18/2017   CHF (congestive heart failure)    Cholelithiasis without obstruction 05/01/2020   Chronic gouty arthritis    Chronic heart failure with preserved ejection fraction 05/01/2020   Chronic kidney disease (CKD), active medical management without dialysis, stage 3 (moderate) 05/01/2020   Closed torus fracture of distal end of right radius with delayed healing 08/12/2021   Delusional thoughts    Diverticulosis of colon 05/01/2020   Epistaxis    Essential (primary) hypertension 08/18/2017   Gastroesophageal reflux disease 05/01/2020   Gastrointestinal hemorrhage    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 11/14/2019   Hypercoagulability due to atrial fibrillation 05/01/2020   Hyperlipidemia 11/14/2019   Hypocalcemia 12/14/2020   Hypoglycemia    Hypokalemia 12/14/2020   Hypomagnesemia 12/14/2020   Hypotension 12/14/2020   Idiopathic pulmonary fibrosis 05/01/2020   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   Iron deficiency anemia    Long term (current) use of anticoagulants    Malnutrition of mild degree [name]: 75% to less than 90% of standard weight)    Mild dementia 08/12/2021   Mitral and aortic incompetence 11/14/2019   Non-rheumatic atrial fibrillation 05/01/2020   Non-toxic multinodular goiter 08/20/2020   NSVT (nonsustained ventricular tachycardia) 08/18/2017   Oropharyngeal dysphagia    Osteoarthritis of hip 05/01/2020   Osteoarthritis of knee  05/01/2020   Osteopenia of neck of left femur    Pain of left hip joint 12/12/2017   Proteinuria 05/01/2020   Raynaud's disease    Recurrent falls 04/09/2021   Rheumatoid arthritis    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   Septic shock 03/10/2020   Slow transit constipation    Systemic lupus erythematosus 05/01/2020    Thrombophilia    Transient ischemic attack    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   Vitamin D deficiency    Past Surgical History:  Past Surgical History:  Procedure Laterality Date   BREAST BIOPSY Left    CARDIAC VALVE SURGERY     CARPAL TUNNEL RELEASE     COLONOSCOPY WITH PROPOFOL N/A 12/20/2020   Procedure: COLONOSCOPY WITH PROPOFOL;  Surgeon: Arta Silence, MD;  Location: [facility];  Service: Endoscopy;  Laterality: N/A;   HEMORRHOID SURGERY     PACEMAKER INSERTION     RIGHT/LEFT HEART CATH AND CORONARY ANGIOGRAPHY N/A 11/27/2020   Procedure: RIGHT/LEFT HEART CATH AND CORONARY ANGIOGRAPHY;  Surgeon: Jolaine Artist, MD;  Location: [facility];  Service: Cardiovascular;  Laterality: N/A;   TONSILLECTOMY      Social History:  reports that she has never smoked. She has been exposed to tobacco smoke. She has never used smokeless tobacco. She reports that she does not drink alcohol and does not use drugs. Family History: family history includes Cancer in her brother; Diabetes in her brother; Heart disease in her father, mother, and sister; Hypertension in her father and mother; Osteoarthritis in her father and mother; Rheum arthritis in her mother.   HOME MEDICATIONS: Allergies as of 11/06/2021       Reactions   Propofol Other (See Comments)   "Caused asthma"   Flecainide Other (See Comments)   Reaction not recalled   Amiodarone Other (See Comments)   Delirium/Confusion/Psychosis   Amlodipine Other (See Comments)   "Tired and syncope"        Medication List        Accurate as of November 06, 2021  1:30 PM. If you have any questions, ask your nurse or doctor.          acetaminophen 500 MG tablet Commonly known as: TYLENOL Take 1,000 mg by mouth every 6 (six) hours as needed (pain).   albuterol 108 (90 Base) MCG/ACT inhaler Commonly known as: VENTOLIN HFA Inhale 2 puffs into the lungs every 6 (six) hours as needed for wheezing or shortness of  breath.   allopurinol 100 MG tablet Commonly known as: ZYLOPRIM TAKE 1 TABLET BY MOUTH EVERY DAY   Breztri Aerosphere 160-9-4.8 MCG/ACT Aero Generic drug: Budeson-Glycopyrrol-Formoterol Inhale 2 puffs into the lungs 2 (two) times daily as needed ("for flares").   famotidine 20 MG tablet Commonly known as: PEPCID Take 20 mg by mouth at bedtime as needed for heartburn or indigestion.   ferrous sulfate 325 (65 FE) MG tablet Take 325 mg by mouth See admin instructions. Take 325 mg by mouth with breakfast and supper   furosemide 40 MG tablet Commonly known as: LASIX Take 40 mg by mouth See admin instructions. Take 40 mg by mouth in the morning (scheduled) and an additional 40 mg at bedtime if swelling is present. HOLD IF SYSTOLIC READING IS <102 OR DIASTOLIC READING IS <58.   hydroxychloroquine 200 MG tablet Commonly known as: PLAQUENIL Take 1 tablet (200 mg total) by mouth daily.   levothyroxine 25 MCG tablet Commonly known as: SYNTHROID TAKE 1  TABLET BY MOUTH EVERY DAY   metoprolol succinate 25 MG 24 hr tablet Commonly known as: TOPROL-XL Take 1 tablet (25 mg total) by mouth daily. Hold dose if <110 SBP or <60 DBP What changed:  when to take this additional instructions   multivitamin with minerals tablet Take 1 tablet by mouth daily with lunch.   Ofev 150 MG Caps Generic drug: Nintedanib Take 150 mg by mouth at bedtime.   OLANZapine 2.5 MG tablet Commonly known as: ZYPREXA TAKE 1 TABLET BY MOUTH AT BEDTIME.   predniSONE 5 MG tablet Commonly known as: DELTASONE Take 1 tablet (5 mg total) by mouth daily with breakfast. Patient to double up dose during sick day rule   Rivaroxaban 15 MG Tabs tablet Commonly known as: XARELTO Take 1 tablet (15 mg total) by mouth daily with supper.   Vitamin D3 25 MCG (1000 UT) Caps Take 1,000 Units by mouth daily with lunch.          REVIEW OF SYSTEMS: A comprehensive ROS was conducted with the patient and is negative except  as per HPI    OBJECTIVE:  VS: BP 134/86 (BP Location: Left Arm, Patient Position: Sitting, Cuff Size: Small)   Pulse 76   Ht 5' 3"  (1.6 m)   Wt 124 lb (56.2 kg)   SpO2 94%   BMI 21.97 kg/m    Wt Readings from Last 3 Encounters:  11/06/21 124 lb (56.2 kg)  10/26/21 124 lb (56.2 kg)  09/08/21 125 lb (56.7 kg)     EXAM: General: Pt appears well and is in NAD  Neck: General: Supple without adenopathy. Thyroid: Thyroid size normal.  Left thyroid nodule appreciated  Lungs: Clear with good BS bilat with no rales, rhonchi, or wheezes  Heart: Auscultation: RRR.  Abdomen: Normoactive bowel sounds, soft, nontender, without masses or organomegaly palpable  Extremities:  BL LE: No pretibial edema normal ROM and strength.  Mental Status: Judgment, insight: Intact Orientation: Oriented to time, place, and person Mood and affect: No depression, anxiety, or agitation     DATA REVIEWED:   Latest Reference Range & Units Most Recent  TSH 0.350 - 4.500 uIU/mL 2.075 07/25/21 18:18    Latest Reference Range & Units 10/26/21 15:08  Sodium 135 - 146 mmol/L 142  Potassium 3.5 - 5.3 mmol/L 3.6  Chloride 98 - 110 mmol/L 105  CO2 20 - 32 mmol/L 26  Glucose 65 - 99 mg/dL 81  BUN 7 - 25 mg/dL 27 (H)  Creatinine 0.60 - 1.00 mg/dL 0.88  Calcium 8.6 - 10.4 mg/dL 9.1  BUN/Creatinine Ratio 6 - 22 (calc) 31 (H)  eGFR > OR = 60 mL/min/1.32m 69  AG Ratio 1.0 - 2.5 (calc) 0.9 (L)  AST 10 - 35 U/L 26  ALT 6 - 29 U/L 15  Total Protein 6.1 - 8.1 g/dL 7.7  Total Bilirubin 0.2 - 1.2 mg/dL 0.7    Thyroid ultrasound 08/28/2020   Diffusely enlarged, heterogeneous and irregular thyroid gland. The appearance is most consistent with diffuse goitrous change.   Nodule # 2:   Location: Left; Mid   Maximum size: 2.0 cm; Other 2 dimensions: 1.6 x 1.5 cm   Composition: solid/almost completely solid (2)   Echogenicity: cannot determine (1)   Shape: not taller-than-wide (0)   Margins: ill-defined  (0)   Echogenic foci: peripheral calcifications (2)   ACR TI-RADS total points: 5.   ACR TI-RADS risk category: TR4 (4-6 points).   ACR TI-RADS recommendations:   **Given  size (>/= 1.5 cm) and appearance, fine needle aspiration of this moderately suspicious nodule should be considered based on TI-RADS criteria.   _________________________________________________________   Region of pseudo nodularity in the right mid gland.   IMPRESSION: 1. Diffusely enlarged, heterogeneous and lobular thyroid gland most consistent with diffuse goitrous change. 2. A peripherally calcified nodule in the left mid gland meets consensus criteria to consider fine-needle aspiration biopsy.    FNA left mid nodule 05/20/2021    Clinical History: Left mid 2.0cm; Other 2 dimensions: 1.6 x 1.5cm, Solid  /almost completely solid, peripheral calcifications, TI-RADS total  points 5  Specimen Submitted:  A. THYROID, LEFT MID, FINE NEEDLE ASPIRATION:    FINAL MICROSCOPIC DIAGNOSIS:  - Consistent with benign follicular nodule (Bethes[employer] category II)    ASSESSMENT/PLAN/RECOMMENDATIONS:   Multinodular Goiter :   -Patient with occasional local neck symptoms -She is s/p benign FNA of the left nodule 04/2021 -She is due for annual thyroid ultrasound, an order has been placed   2. Hypothyroidism:  -TSH normal -No change   Medication Continue levothyroxine 25 MCG daily   3.  Secondary adrenal insufficiency:   -This is due to chronic use of glucocorticoids for rheumatoid arthritis.  She was on prednisone for years which was switched to hydrocortisone since her hospitalization in [place] in 02/2020 -She has a medical alert bracelet -We discussed sick day will   Medication  Can continue prednisone 5 mg daily with breakfast    Follow-up in 1 yr       Signed electronically by: Mack Guise, MD  [facility] Endocrinology  [address] [address] [place], [place] [address] Phone: [phone] FAX: [phone]   CC: Scheryl Marten, [name] [address] [place] [address] Phone: [phone] Fax: [phone]   Return to Endocrinology clinic as below: Future Appointments  Date Time Provider [place]  11/17/2021  7:00 AM [facility] DEVICE REMOTES [facility]  02/16/2022  7:00 AM [facility] DEVICE REMOTES [facility]  03/16/2022  2:10 PM Metta Clines R, DO [facility] None  04/29/2022 11:00 AM Collier Salina, MD [facility] None  05/18/2022  7:00 AM [facility] DEVICE REMOTES [facility]  08/17/2022  7:00 AM [facility] DEVICE REMOTES [facility]  11/16/2022  7:00 AM [facility] DEVICE REMOTES [facility]  02/15/2023  7:00 AM [facility] DEVICE REMOTES [facility]

## 2021-11-17 ENCOUNTER — Ambulatory Visit (INDEPENDENT_AMBULATORY_CARE_PROVIDER_SITE_OTHER): Payer: Medicare HMO

## 2021-11-17 DIAGNOSIS — I429 Cardiomyopathy, unspecified: Secondary | ICD-10-CM | POA: Diagnosis not present

## 2021-11-17 LAB — CUP PACEART REMOTE DEVICE CHECK
Battery Remaining Longevity: 62 mo
Battery Voltage: 2.98 V
Brady Statistic RV Percent Paced: 98.89 %
Date Time Interrogation Session: 20231024044224
HighPow Impedance: 55 Ohm
Implantable Lead Connection Status: 753985
Implantable Lead Implant Date: 20120920
Implantable Lead Location: 753862
Implantable Pulse Generator Implant Date: 20190809
Lead Channel Impedance Value: 285 Ohm
Lead Channel Impedance Value: 304 Ohm
Lead Channel Pacing Threshold Amplitude: 0.625 V
Lead Channel Pacing Threshold Pulse Width: 0.4 ms
Lead Channel Sensing Intrinsic Amplitude: 13.375 mV
Lead Channel Sensing Intrinsic Amplitude: 13.375 mV
Lead Channel Setting Pacing Amplitude: 1.25 V
Lead Channel Setting Pacing Pulse Width: 0.4 ms
Lead Channel Setting Sensing Sensitivity: 0.3 mV
Zone Setting Status: 755011
Zone Setting Status: 755011

## 2021-11-18 ENCOUNTER — Other Ambulatory Visit: Payer: Self-pay | Admitting: Neurology

## 2021-11-18 ENCOUNTER — Telehealth: Payer: Self-pay

## 2021-11-18 NOTE — Telephone Encounter (Signed)
Scheduled remote reviewed. Normal device function.   3 NSVT, longest approx 15 sec (39 beats) with rate avg below detection 166-176bpm. Optivol fluid index at threshold with thoracic impedance slightly below daily reference.   Outreach made to Pt-spoke with daughter.  Pt was asleep during episode. Advised would forward to physician and call if any medication changes.

## 2021-11-20 ENCOUNTER — Other Ambulatory Visit: Payer: Self-pay | Admitting: Internal Medicine

## 2021-11-20 DIAGNOSIS — M1A09X0 Idiopathic chronic gout, multiple sites, without tophus (tophi): Secondary | ICD-10-CM

## 2021-11-20 NOTE — Telephone Encounter (Signed)
Next Visit: 04/29/2022  Last Visit: 10/26/2021  Last Fill: 07/22/2021  DX: Idiopathic chronic gout of multiple sites without tophus  Current Dose per office note 10/26/2021: allopurinol 100 mg daily   Labs: 10/26/2021 We can continue with the current medications no new changes needed.   Okay to refill allopurinol?

## 2021-11-25 NOTE — Progress Notes (Unsigned)
Cardiology Office Note Date:  11/25/2021  Patient ID:  Lindsey Huber, Lindsey Huber 02/21/1945, MRN [id number] PCP:  Collene Mares, PA  Cardiologist:  Dr. [name] Electrophysiologist: Dr. Lalla Brothers    Chief Complaint: *** planned f/u  History of Present Illness: Lindsey Huber is a 76 y.o. female with history of IPF, HTN, GERD, AFib/flutter (permanent), VT, torsades (in setting of flecainide) > ICD, ? Hx of TIA hx,  VHD w/ bioprosthetic AVR, MV ring, TV replacement (2012), CKD (III), SLE, anemia of chronic disease, hypothyroidism/MNG, chronic CHF (diastolic), ? Adrenal insufficiency  1. On September 25, 2010 the patient was brought to the operating room with Dr. [name] and underwent aortic valve replacement, bioprosthetic, excision of the subaortic membrane, mitral valve annuloplasty, tricuspid valve repair followed by tricuspid valve replacement, and manual extraction of right ventricular lead. At that time she was brought to the intensive care unit with an open chest secondary to continued bleeding. 2. On September 3rd the patient was brought back to the operating room with Dr. [name] for reexploration of the mediastinum with mediastinal washout and sternal closure. 3. A third procedure was performed on September 20 by Dr. [name] which included an ICD lead insertion and generator replacement with a Medtronic ICD.  She saw Dr. Lalla Brothers April 2022, to establish management of her device, moving from [place]. She reported some nose bleeds and changed to Xarelto.  Nov 2022, admitted with GIB, CT scan showed acute GI hemorrhage involving mid descending colon, likely acute diverticular bleed. Transfused 3u RBCs. Colonoscopy 11/26 which noted hemorrhoids and diverticulosis, no active bleeding, recommended to restart Xarelto in 3 days if stable  She had an ER visit Feb 2023 with some transient confusion, word finding difficulty.  CT brain without acute findings, labs  unremarkable.  She had fall a couple days prior striking her arm, felt to have a cellulitis They did get hx of perhaps TIA in June of last year Neurology consulted, recommended MRI though, unable to get MRI done on the weekend given her device and pt/family preferred not to wait on that given resolved symptoms Discharged on Keflex and advised to f/u with neurology  She has been established with the HF team, saw Dr. [name] last 04/23/21, doing OK, waxing/waning DOE/SOB, exertional capacity., weight stable Planned to update her echo to re-evaluate her VHD, discussed  that "she has significant prosthetic valve TR with RV failure. Pulmonary pressures only mildly elevated, Discussed need for closer volume management with sliding scale lasix. Can switch to torsemide as needed, Will repeat echo at next visit. If RV failure and TR worsens may need to consider percutaneous valve options"   I saw her 04/29/21 She comes with her grand daughter. Since her visit with Dr. [name], no new symptoms or concerns She is currently getting an ambulatory EEG via neurology team. No syncope, no device therapies Occassionally she feel like the device sits towards he axilla, though otherwise no concerns. She reports her arm healed up well No bleeding, signs of bleeding No VT, volume stable No changes were made, planned for an annual visit, to c/w HF team  No further cardiac appt since then.  05/29/21: ER visit after a fall, tripped, device interrogated, normal findings reported, imaging was iok,  07/25/21: AMS, poor sleep, increasing AMS, "known dementia", observed paranoid behavior, perhaps hallucinations, admitted for further management Discharged 07/27/21 to f/u with PMD 07/30/21: ER visit, refusing meds, AMS, dementia  Following with neurosych vascular vs Lewy body dementia, ?also mentions  poss complex partial seizure, w/u ongoing, physchology since then regularly and has seen endo for MNG/adrenal  insufficiency  DEVICE remote with NSVTs,longest 15 seconds and f/u arranged  *** VT, taking meds??   *** device *** HF team *** volume *** Xarelto, bleeding, dose *** rates   Device information MDT single chamber ICD implanted 10/15/2010, gen change 09/02/2017 There is mention of ?damage to one of her device leads during her valvular surgery Secondary prevention indication with reports of torsades/VT In review of her CXRs, appears she has an abandoned RA lead and a broken/abandoned dual coils RV lead. Original device implanted 06/23/2006, indication was for "Sick sinus syndrome, atrial standstill, advanced arteriovenous block (AV Wenckebach rate 80 beats per minute), VT/VF arrest (torsades), mixed connective tissue disease"  Past Medical History:  Diagnosis Date   Achalasia    Acquired hypothyroidism 58/85/0277   Acute metabolic encephalopathy 41/28/7867   AICD (automatic cardioverter/defibrillator) present 11/14/2019   Allergic rhinitis    Aortic valve disorder 03/26/2002   Atherosclerosis of abdominal aorta 05/01/2020   Cardiomyopathy    CHB (complete heart block) 08/18/2017   CHF (congestive heart failure)    Cholelithiasis without obstruction 05/01/2020   Chronic gouty arthritis    Chronic heart failure with preserved ejection fraction 05/01/2020   Chronic kidney disease (CKD), active medical management without dialysis, stage 3 (moderate) 05/01/2020   Closed torus fracture of distal end of right radius with delayed healing 08/12/2021   Delusional thoughts    Diverticulosis of colon 05/01/2020   Epistaxis    Essential (primary) hypertension 08/18/2017   Gastroesophageal reflux disease 05/01/2020   Gastrointestinal hemorrhage    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 11/14/2019   Hypercoagulability due to atrial fibrillation 05/01/2020   Hyperlipidemia 11/14/2019   Hypocalcemia 12/14/2020   Hypoglycemia    Hypokalemia 12/14/2020    Hypomagnesemia 12/14/2020   Hypotension 12/14/2020   Idiopathic pulmonary fibrosis 05/01/2020   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   Iron deficiency anemia    Long term (current) use of anticoagulants    Malnutrition of mild degree [name]: 75% to less than 90% of standard weight)    Mild dementia 08/12/2021   Mitral and aortic incompetence 11/14/2019   Non-rheumatic atrial fibrillation 05/01/2020   Non-toxic multinodular goiter 08/20/2020   NSVT (nonsustained ventricular tachycardia) 08/18/2017   Oropharyngeal dysphagia    Osteoarthritis of hip 05/01/2020   Osteoarthritis of knee 05/01/2020   Osteopenia of neck of left femur    Pain of left hip joint 12/12/2017   Proteinuria 05/01/2020   Raynaud's disease    Recurrent falls 04/09/2021   Rheumatoid arthritis    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   Septic shock 03/10/2020   Slow transit constipation    Systemic lupus erythematosus 05/01/2020   Thrombophilia    Transient ischemic attack    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   Vitamin D deficiency     Past Surgical History:  Procedure Laterality Date   BREAST BIOPSY Left    CARDIAC VALVE SURGERY     CARPAL TUNNEL RELEASE     COLONOSCOPY WITH PROPOFOL N/A 12/20/2020   Procedure: COLONOSCOPY WITH PROPOFOL;  Surgeon: Arta Silence, MD;  Location: [facility];  Service: Endoscopy;  Laterality: N/A;   HEMORRHOID SURGERY     PACEMAKER INSERTION     RIGHT/LEFT HEART CATH AND CORONARY ANGIOGRAPHY N/A 11/27/2020   Procedure: RIGHT/LEFT HEART CATH AND CORONARY ANGIOGRAPHY;  Surgeon: Jolaine Artist, MD;  Location: [facility];  Service: Cardiovascular;  Laterality: N/A;   TONSILLECTOMY      Current Outpatient Medications  Medication Sig Dispense Refill   acetaminophen (TYLENOL) 500 MG tablet Take 1,000 mg by mouth every 6 (six) hours as needed (pain).     albuterol (VENTOLIN HFA) 108 (90 Base) MCG/ACT inhaler Inhale 2 puffs  into the lungs every 6 (six) hours as needed for wheezing or shortness of breath.     allopurinol (ZYLOPRIM) 100 MG tablet TAKE 1 TABLET BY MOUTH EVERY DAY 90 tablet 1   Budeson-Glycopyrrol-Formoterol (BREZTRI AEROSPHERE) 160-9-4.8 MCG/ACT AERO Inhale 2 puffs into the lungs 2 (two) times daily as needed ("for flares").     Cholecalciferol (VITAMIN D3) 25 MCG (1000 UT) CAPS Take 1,000 Units by mouth daily with lunch.     famotidine (PEPCID) 20 MG tablet Take 20 mg by mouth at bedtime as needed for heartburn or indigestion.     ferrous sulfate 325 (65 FE) MG tablet Take 325 mg by mouth See admin instructions. Take 325 mg by mouth with breakfast and supper     furosemide (LASIX) 40 MG tablet Take 40 mg by mouth See admin instructions. Take 40 mg by mouth in the morning (scheduled) and an additional 40 mg at bedtime if swelling is present. HOLD IF SYSTOLIC READING IS <110 OR DIASTOLIC READING IS <60.     hydroxychloroquine (PLAQUENIL) 200 MG tablet Take 1 tablet (200 mg total) by mouth daily. 90 tablet 1   levothyroxine (SYNTHROID) 25 MCG tablet Take 1 tablet (25 mcg total) by mouth daily. 90 tablet 3   metoprolol succinate (TOPROL-XL) 25 MG 24 hr tablet Take 1 tablet (25 mg total) by mouth daily. Hold dose if <110 SBP or <60 DBP (Patient taking differently: Take 25 mg by mouth See admin instructions. Take 25 mg by mouth once a day and HOLD IF systolic reading is <110 OR diastolic reading is <60) 90 tablet 3   Multiple Vitamins-Minerals (MULTIVITAMIN WITH MINERALS) tablet Take 1 tablet by mouth daily with lunch.     Nintedanib (OFEV) 150 MG CAPS Take 150 mg by mouth at bedtime.     OLANZapine (ZYPREXA) 2.5 MG tablet TAKE 1 TABLET BY MOUTH EVERYDAY AT BEDTIME 90 tablet 0   predniSONE (DELTASONE) 5 MG tablet Take 1 tablet (5 mg total) by mouth daily with breakfast. Patient to double up dose during sick day rule 100 tablet 3   Rivaroxaban (XARELTO) 15 MG TABS tablet Take 1 tablet (15 mg total) by mouth daily  with supper. 90 tablet 3   No current facility-administered medications for this visit.    Allergies:   Propofol, Flecainide, Amiodarone, and Amlodipine   Social History:  The patient  reports that she has never smoked. She has been exposed to tobacco smoke. She has never used smokeless tobacco. She reports that she does not drink alcohol and does not use drugs.   Family History:  The patient's family history includes Cancer in her brother; Diabetes in her brother; Heart disease in her father, mother, and sister; Hypertension in her father and mother; Osteoarthritis in her father and mother; Rheum arthritis in her mother.  ROS:  Please see the history of present illness.    All other systems are reviewed and otherwise negative.   PHYSICAL EXAM:  VS:  There were no vitals taken for this visit. BMI: There is no height or weight on file to calculate BMI. Well nourished, well developed, in no acute distress HEENT: normocephalic, atraumatic  Neck: no JVD, carotid bruits or masses Cardiac:  *** RRR; (paced), 2-3/6 SM, no rubs, or gallops Lungs:  *** CTA b/l, no wheezing, rhonchi or rales Abd: soft, nontender MS: no deformity, advanced  atrophy Ext: *** no edema Skin: warm and dry, no rash Neuro:  No gross deficits appreciated Psych: euthymic mood, full affect   ICD site is stable, no tethering or discomfort, pocket looks good   EKG:  not done today  Device interrogation done today and reviewed by myself:  ***  05/13/21: TTE 1. Left ventricular ejection fraction, by estimation, is 50 to 55%. The  left ventricle has low normal function. The left ventricle has no regional  wall motion abnormalities. There is severe concentric left ventricular  hypertrophy. Left ventricular  diastolic function could not be evaluated.   2. Right ventricular systolic function is moderately reduced. The right  ventricular size is normal. There is mildly elevated pulmonary artery  systolic pressure.   3.  Left atrial size was severely dilated.   4. Right atrial size was severely dilated.   5. The mitral valve has been repaired/replaced. Mild mitral valve  regurgitation. Mild mitral stenosis. The mean mitral valve gradient is 5.0  mmHg. There is a prosthetic annuloplasty ring present in the mitral  position.   6. The tricuspid valve is has been repaired/replaced. The tricuspid valve  is status post repair with an annuloplasty ring. Tricuspid valve  regurgitation is moderate. Mild tricuspid stenosis.   7. The aortic valve has been repaired/replaced. Aortic valve  regurgitation is not visualized. Moderate aortic valve stenosis. There is  a 19 mm bovine valve present in the aortic position. Echo findings are  consistent with normal structure and function  of the aortic valve prosthesis.   8. The inferior vena cava is dilated in size with >50% respiratory  variability, suggesting right atrial pressure of 8 mmHg.   Comparison(s): No significant change from prior study. Increase in Aortic  valve mean gradient, from 18.6 mmHg to 35 mmHg.    R/LHC 11/27/20 Ao =129/63 (89) LV = 141/8 RA = 11 (v waves to 14) RV = 43/4 PA = 42/15 (25) PCW = 10 Fick cardiac output/index = 5.7/3.7 PVR = 2.1 WU SVR = 1101 PAPi = 2.45 WU Ao sat = 96% PA sat =  74%, 75% SVC sat = 82% MVA (LV-wedge tracing) = 3.5cm2 mean gradient across MV 3.68mmHG  Assessment:  1. Normal coronary arteries 2. EF 60-65% 3. Very mild PAH 4. Normal left-sided filling pressures with no significant v-waves in PCWP tracing to suggest hemodynamically significant MR 5. No significant stenosis across AoV, MV or TV   10/31/2020: TTE IMPRESSIONS   1. Left ventricular ejection fraction, by estimation, is 50 to 55%. The  left ventricle has low normal function. The left ventricle has no regional  wall motion abnormalities. There is severe concentric left ventricular  hypertrophy. Left ventricular  diastolic parameters are  indeterminate. There is Abnormal septal motion.   2. Right ventricular systolic function is moderately reduced. The right  ventricular size is moderately enlarged. There is severely elevated  pulmonary artery systolic pressure.   3. Left atrial size was severely dilated.   4. Right atrial size was severely dilated.   5. The mitral valve has been repaired. The annular ring appears to be  well seated. Mild to moderate mitral valve regurgitation. No evidence of  mitral stenosis. Procedure Date: 2012.   6. The tricuspid valve is has been repaired, there  is a 20 mm bovine  pericardial valve. Tricuspid valve regurgitation is moderate.   7. The aortic valve has been replaced. The bioprosthetic valve is  well-seated. There is a 19 mm bovine pericardial valve present in the  aortic position. Procedure Date: 2012. No aortic stenosis or regurgitation  present.   8. Mild dilatation of the ascending aorta, measuring 39 mm. Abdominal  aorta is dilated measuring 2.83 cm.   9. The inferior vena cava is normal in size with greater than 50%  respiratory variability, suggesting right atrial pressure of 3 mmHg.   Comparison(s): Most recent study 03/11/2020 report reviewed in care  everywhere.   Recent Labs: 03/14/2021: Magnesium 1.8 07/25/2021: B Natriuretic Peptide 479.0; TSH 2.075 10/26/2021: ALT 15; BUN 27; Creat 0.88; Hemoglobin 10.9; Platelets 119; Potassium 3.6; Sodium 142  No results found for requested labs within last 365 days.   CrCl cannot be calculated (Patient's most recent lab result is older than the maximum 21 days allowed.).   Wt Readings from Last 3 Encounters:  11/06/21 124 lb (56.2 kg)  10/26/21 124 lb (56.2 kg)  09/08/21 125 lb (56.7 kg)     Other studies reviewed: Additional studies/records reviewed today include: summarized above  ASSESSMENT AND PLAN:  ICD VT *** Intact function *** No programming changes made *** No VT noted  Permanent AFib CHA2DS2Vasc is 6, on  xarelto, appropriately dosed 98.6 % VP No R waves at 40 today No high VR noted  Chronic CHF (diastolic) VHD aortic valve replacement, bioprosthetic, excision of the subaortic membrane, mitral valve annuloplasty, tricuspid valve repair followed by tricuspid valve replacement ***  P.HTN IPF Following closely with HF and pulmonary teams *** OptiVol looks good *** Exam does not suggest volume OL ***   Disposition: F/u with remotes as usual and in clinic with EP/device in a year, sooner if needed  Current medicines are reviewed at length with the patient today.  The patient did not have any concerns regarding medicines.  Norma Fredrickson, PA-C 11/25/2021 7:18 AM     [facility] [address] [place] [address] [phone] (office)  [phone] (fax)

## 2021-11-26 ENCOUNTER — Ambulatory Visit: Payer: Medicare HMO | Attending: Physician Assistant | Admitting: Physician Assistant

## 2021-11-26 ENCOUNTER — Encounter: Payer: Self-pay | Admitting: Physician Assistant

## 2021-11-26 VITALS — BP 158/88 | HR 83 | Ht 62.0 in | Wt 131.0 lb

## 2021-11-26 DIAGNOSIS — I472 Ventricular tachycardia, unspecified: Secondary | ICD-10-CM

## 2021-11-26 DIAGNOSIS — Z9889 Other specified postprocedural states: Secondary | ICD-10-CM

## 2021-11-26 DIAGNOSIS — Z95810 Presence of automatic (implantable) cardiac defibrillator: Secondary | ICD-10-CM

## 2021-11-26 DIAGNOSIS — I4821 Permanent atrial fibrillation: Secondary | ICD-10-CM | POA: Diagnosis not present

## 2021-11-26 DIAGNOSIS — I10 Essential (primary) hypertension: Secondary | ICD-10-CM

## 2021-11-26 DIAGNOSIS — Z79899 Other long term (current) drug therapy: Secondary | ICD-10-CM

## 2021-11-26 DIAGNOSIS — Z952 Presence of prosthetic heart valve: Secondary | ICD-10-CM

## 2021-11-26 LAB — CUP PACEART INCLINIC DEVICE CHECK
Battery Remaining Longevity: 61 mo
Battery Voltage: 2.97 V
Brady Statistic RV Percent Paced: 99.01 %
Date Time Interrogation Session: 20231102130316
HighPow Impedance: 49 Ohm
Implantable Lead Connection Status: 753985
Implantable Lead Implant Date: 20120920
Implantable Lead Location: 753862
Implantable Pulse Generator Implant Date: 20190809
Lead Channel Impedance Value: 285 Ohm
Lead Channel Impedance Value: 304 Ohm
Lead Channel Pacing Threshold Amplitude: 0.75 V
Lead Channel Pacing Threshold Pulse Width: 0.4 ms
Lead Channel Sensing Intrinsic Amplitude: 8.5 mV
Lead Channel Sensing Intrinsic Amplitude: 8.5 mV
Lead Channel Setting Pacing Amplitude: 1.5 V
Lead Channel Setting Pacing Pulse Width: 0.4 ms
Lead Channel Setting Sensing Sensitivity: 0.3 mV
Zone Setting Status: 755011
Zone Setting Status: 755011

## 2021-11-26 LAB — BASIC METABOLIC PANEL
BUN/Creatinine Ratio: 34 — ABNORMAL HIGH (ref 12–28)
BUN: 34 mg/dL — ABNORMAL HIGH (ref 8–27)
CO2: 30 mmol/L — ABNORMAL HIGH (ref 20–29)
Calcium: 9.2 mg/dL (ref 8.7–10.3)
Chloride: 103 mmol/L (ref 96–106)
Creatinine, Ser: 1.01 mg/dL — ABNORMAL HIGH (ref 0.57–1.00)
Glucose: 91 mg/dL (ref 70–99)
Potassium: 3.2 mmol/L — ABNORMAL LOW (ref 3.5–5.2)
Sodium: 142 mmol/L (ref 134–144)
eGFR: 58 mL/min/{1.73_m2} — ABNORMAL LOW (ref >59)

## 2021-11-26 LAB — MAGNESIUM: Magnesium: 1.5 mg/dL — ABNORMAL LOW (ref 1.6–2.3)

## 2021-11-26 NOTE — Patient Instructions (Signed)
Medication Instructions:   Your physician recommends that you continue on your current medications as directed. Please refer to the Current Medication list given to you today.   *If you need a refill on your cardiac medications before your next appointment, please call your pharmacy*   Lab Work: [facility] AND [facility]    If you have labs (blood work) drawn today and your tests are completely normal, you will receive your results only by: [place] (if you have MyChart) OR A paper copy in the mail If you have any lab test that is abnormal or we need to change your treatment, we will call you to review the results.   Testing/Procedures: NONE ORDERED  TODAY    Follow-Up: At [facility], you and your health needs are our priority.  As part of our continuing mission to provide you with exceptional heart care, we have created designated Provider Care Teams.  These Care Teams include your primary Cardiologist (physician) and Advanced Practice Providers (APPs -  Physician Assistants and Nurse Practitioners) who all work together to provide you with the care you need, when you need it.  We recommend signing up for the patient portal called "MyChart".  Sign up information is provided on this After Visit Summary.  MyChart is used to connect with patients for Virtual Visits (Telemedicine).  Patients are able to view lab/test results, encounter notes, upcoming appointments, etc.  Non-urgent messages can be sent to your provider as well.   To learn more about what you can do with MyChart, go to [email or web address].    Your next appointment:   DR Haroldine Laws / APP NEXT AVAILABLE   6 month(s)  The format for your next appointment:    In Person  Provider:    Tommye Standard, PA-C     Important Information About Sugar

## 2021-11-27 ENCOUNTER — Other Ambulatory Visit: Payer: Self-pay | Admitting: *Deleted

## 2021-11-27 DIAGNOSIS — Z79899 Other long term (current) drug therapy: Secondary | ICD-10-CM

## 2021-11-27 MED ORDER — POTASSIUM CHLORIDE CRYS ER 20 MEQ PO TBCR: 20.0000 meq | EXTENDED_RELEASE_TABLET | Freq: Every day | ORAL | 1 refills | Status: DC

## 2021-11-27 MED ORDER — MAGNESIUM OXIDE 400 MG PO TABS: 400.0000 mg | ORAL_TABLET | Freq: Every day | ORAL | 2 refills | Status: DC

## 2021-12-01 NOTE — Progress Notes (Signed)
Remote ICD transmission.   

## 2021-12-04 ENCOUNTER — Ambulatory Visit: Payer: Medicare HMO | Attending: Physician Assistant

## 2021-12-04 DIAGNOSIS — Z79899 Other long term (current) drug therapy: Secondary | ICD-10-CM

## 2021-12-05 LAB — BASIC METABOLIC PANEL
BUN/Creatinine Ratio: 27 (ref 12–28)
BUN: 32 mg/dL — ABNORMAL HIGH (ref 8–27)
CO2: 25 mmol/L (ref 20–29)
Calcium: 10.1 mg/dL (ref 8.7–10.3)
Chloride: 99 mmol/L (ref 96–106)
Creatinine, Ser: 1.19 mg/dL — ABNORMAL HIGH (ref 0.57–1.00)
Glucose: 92 mg/dL (ref 70–99)
Potassium: 4.2 mmol/L (ref 3.5–5.2)
Sodium: 142 mmol/L (ref 134–144)
eGFR: 48 mL/min/{1.73_m2} — ABNORMAL LOW (ref >59)

## 2021-12-05 LAB — MAGNESIUM: Magnesium: 1.9 mg/dL (ref 1.6–2.3)

## 2021-12-07 ENCOUNTER — Other Ambulatory Visit: Payer: Medicare HMO

## 2021-12-10 ENCOUNTER — Ambulatory Visit
Admission: RE | Admit: 2021-12-10 | Discharge: 2021-12-10 | Disposition: A | Discharge status: Home / Self Care | Payer: Medicare HMO | Source: Ambulatory Visit | Attending: Internal Medicine | Admitting: Internal Medicine

## 2021-12-10 DIAGNOSIS — E042 Nontoxic multinodular goiter: Secondary | ICD-10-CM

## 2021-12-15 ENCOUNTER — Ambulatory Visit: Payer: Medicare HMO | Admitting: Neurology

## 2021-12-22 ENCOUNTER — Encounter (HOSPITAL_COMMUNITY): Payer: Medicare HMO

## 2021-12-23 ENCOUNTER — Other Ambulatory Visit: Payer: Self-pay | Admitting: Neurology

## 2021-12-24 ENCOUNTER — Ambulatory Visit (HOSPITAL_BASED_OUTPATIENT_CLINIC_OR_DEPARTMENT_OTHER)
Admission: RE | Admit: 2021-12-24 | Discharge: 2021-12-24 | Disposition: A | Discharge status: Home / Self Care | Payer: Medicare HMO | Source: Ambulatory Visit | Attending: Adult Health | Admitting: Adult Health

## 2021-12-24 ENCOUNTER — Encounter (HOSPITAL_COMMUNITY): Payer: Self-pay

## 2021-12-24 VITALS — BP 156/76 | HR 81 | Wt 128.2 lb

## 2021-12-24 DIAGNOSIS — M3214 Glomerular disease in systemic lupus erythematosus: Secondary | ICD-10-CM | POA: Insufficient documentation

## 2021-12-24 DIAGNOSIS — Z953 Presence of xenogenic heart valve: Secondary | ICD-10-CM | POA: Insufficient documentation

## 2021-12-24 DIAGNOSIS — E2749 Other adrenocortical insufficiency: Secondary | ICD-10-CM | POA: Insufficient documentation

## 2021-12-24 DIAGNOSIS — Z7952 Long term (current) use of systemic steroids: Secondary | ICD-10-CM | POA: Insufficient documentation

## 2021-12-24 DIAGNOSIS — D62 Acute posthemorrhagic anemia: Secondary | ICD-10-CM | POA: Diagnosis not present

## 2021-12-24 DIAGNOSIS — J449 Chronic obstructive pulmonary disease, unspecified: Secondary | ICD-10-CM | POA: Insufficient documentation

## 2021-12-24 DIAGNOSIS — Z95810 Presence of automatic (implantable) cardiac defibrillator: Secondary | ICD-10-CM | POA: Insufficient documentation

## 2021-12-24 DIAGNOSIS — I130 Hypertensive heart and chronic kidney disease with heart failure and stage 1 through stage 4 chronic kidney disease, or unspecified chronic kidney disease: Secondary | ICD-10-CM | POA: Insufficient documentation

## 2021-12-24 DIAGNOSIS — I081 Rheumatic disorders of both mitral and tricuspid valves: Secondary | ICD-10-CM | POA: Insufficient documentation

## 2021-12-24 DIAGNOSIS — Z79899 Other long term (current) drug therapy: Secondary | ICD-10-CM | POA: Insufficient documentation

## 2021-12-24 DIAGNOSIS — R0789 Other chest pain: Secondary | ICD-10-CM | POA: Insufficient documentation

## 2021-12-24 DIAGNOSIS — I071 Rheumatic tricuspid insufficiency: Secondary | ICD-10-CM

## 2021-12-24 DIAGNOSIS — J84112 Idiopathic pulmonary fibrosis: Secondary | ICD-10-CM | POA: Insufficient documentation

## 2021-12-24 DIAGNOSIS — N183 Chronic kidney disease, stage 3 unspecified: Secondary | ICD-10-CM | POA: Insufficient documentation

## 2021-12-24 DIAGNOSIS — I495 Sick sinus syndrome: Secondary | ICD-10-CM | POA: Insufficient documentation

## 2021-12-24 DIAGNOSIS — Z7901 Long term (current) use of anticoagulants: Secondary | ICD-10-CM | POA: Insufficient documentation

## 2021-12-24 DIAGNOSIS — J849 Interstitial pulmonary disease, unspecified: Secondary | ICD-10-CM | POA: Insufficient documentation

## 2021-12-24 DIAGNOSIS — I4821 Permanent atrial fibrillation: Secondary | ICD-10-CM | POA: Insufficient documentation

## 2021-12-24 DIAGNOSIS — I272 Pulmonary hypertension, unspecified: Secondary | ICD-10-CM | POA: Insufficient documentation

## 2021-12-24 DIAGNOSIS — I4729 Other ventricular tachycardia: Secondary | ICD-10-CM | POA: Insufficient documentation

## 2021-12-24 DIAGNOSIS — I5032 Chronic diastolic (congestive) heart failure: Secondary | ICD-10-CM | POA: Insufficient documentation

## 2021-12-24 NOTE — Progress Notes (Signed)
ADVANCED HF CLINIC NOTE  Referring Physician: Dr. Quentin Huber Primary Care: Lindsey Huber, [place] Primary Cardiologist: EP Lindsey Huber  HPI: Lindsey Huber is a 76 yo female with chronic diastolic CHF, permanent AF, AFL s/p ablation, SLE, pulmonary fibrosis, pulmonary hypertension. paroxysmal VT, OSA not on cpap and valvular disease s/p AVR/MV ring/TVR. Referred by Dr. Quentin Huber in 10/22 for further evaluation of PAH and valvular heart disease.  History of valvular disease with multiple replaced valves AVR/MV ring/TVR 09/25/2010.  Paroxysmal VT, ICD placed due to torsades on Flecainide with SSS requiring PPM which predated valve repar/replacement surgery 09/25/2010 and in fact ICD lead was damaged during valve replacement surgery.       History of chronic COPD and pulmonary fibrosis followed by pulmonology.  On triple therapy for COPD vs asthma,  nintedanib for PF. 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  Did not smoke but inhaled second hand smoke from husband and mother pretty much her whole life until 01/2020.  Continued nintedanib switched inhaler therapy to [facility].    Echocardiogram 12/06/2018: EF 63%,normal bioprosthetic tricuspid and aortic valve with mild tricuspid regurgitation, mitral valve repair with mild mitral regurgitation, severe biatrial enlargement.   Admitted in [place], [place] for hypotension and acute kidney injury 03/10/2020.  She was placed on broad-spectrum antibiotics with initial diagnosis of sepsis, no source of infection found also required Levophed and IVF infusion to support blood pressure, started on COPD therapy.  There was question of whether adrenal insufficiency contributing.  Steroid therapy reinstituted.   Echo 03/11/2020  LVEF 55 to 60%, moderate to severe tricuspid regurgitation, moderately elevated right ventricular systolic pressure estimate 7mm Hg, moderate mitral regurgitation.  RV not well visualized.    Seen in HF Clinic for first visit 10/27/20. Had NYHA  IIIB symptoms. Referred for R/L cath--> normal coronaries, EF 60-65%, and normal left sided filling pressures.   R/LHC 11/27/20 Ao =129/63 (89) LV = 141/8 RA = 11 (v waves to 14) RV = 43/4 PA = 42/15 (25) PCW = 10 Fick cardiac output/index = 5.7/3.7 PVR = 2.1 WU SVR = 1101 PAPi = 2.45 WU Ao sat = 96% PA sat =  74%, 75% SVC sat = 82% MVA (LV-wedge tracing) = 3.5cm2 mean gradient across MV 3.17mmHG  Assessment:  1. Normal coronary arteries 2. EF 60-65% 3. Very mild PAH 4. Normal left-sided filling pressures with no significant v-waves in PCWP tracing to suggest hemodynamically significant MR 5. No significant stenosis across AoV, MV or TV  Admitted 11/22 with acute LGIB. CT scan showed acute GI hemorrhage involving mid descending colon, likely acute diverticular bleed. Transfused 3u RBCs. Colonoscopy 11/26 which noted hemorrhoids and diverticulosis, no active bleeding, recommended to restart Xarelto in 3 days if stable.   She was seen in the ED 03/14/21 for difficulty with word finding. CT Head with no acute findings. Discharged the same day and has follow up with Neurology.   Today she returns for HF follow up.Overall feeling ok. She has had some chest discomfort after 2 recent falls. Remains SOB with exertion.  Denies PND/Orthopnea. Appetite ok. No fever or chills. Weight at home stable. Taking all medications.  Past Medical History:  Diagnosis Date   Achalasia    Acquired hypothyroidism 123456   Acute metabolic encephalopathy 123456   AICD (automatic cardioverter/defibrillator) present 11/14/2019   Allergic rhinitis    Aortic valve disorder 03/26/2002   Atherosclerosis of abdominal aorta 05/01/2020   Cardiomyopathy    CHB (complete heart block) 08/18/2017  CHF (congestive heart failure)    Cholelithiasis without obstruction 05/01/2020   Chronic gouty arthritis    Chronic heart failure with preserved ejection fraction 05/01/2020   Chronic kidney disease (CKD),  active medical management without dialysis, stage 3 (moderate) 05/01/2020   Closed torus fracture of distal end of right radius with delayed healing 08/12/2021   Delusional thoughts    Diverticulosis of colon 05/01/2020   Epistaxis    Essential (primary) hypertension 08/18/2017   Gastroesophageal reflux disease 05/01/2020   Gastrointestinal hemorrhage    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 11/14/2019   Hypercoagulability due to atrial fibrillation 05/01/2020   Hyperlipidemia 11/14/2019   Hypocalcemia 12/14/2020   Hypoglycemia    Hypokalemia 12/14/2020   Hypomagnesemia 12/14/2020   Hypotension 12/14/2020   Idiopathic pulmonary fibrosis 05/01/2020   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   Iron deficiency anemia    Long term (current) use of anticoagulants    Malnutrition of mild degree Lindsey Huber: 75% to less than 90% of standard weight)    Mild dementia 08/12/2021   Mitral and aortic incompetence 11/14/2019   Non-rheumatic atrial fibrillation 05/01/2020   Non-toxic multinodular goiter 08/20/2020   NSVT (nonsustained ventricular tachycardia) 08/18/2017   Oropharyngeal dysphagia    Osteoarthritis of hip 05/01/2020   Osteoarthritis of knee 05/01/2020   Osteopenia of neck of left femur    Pain of left hip joint 12/12/2017   Proteinuria 05/01/2020   Raynaud's disease    Recurrent falls 04/09/2021   Rheumatoid arthritis    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     albuterol (VENTOLIN HFA) 108 (90 Base) MCG/ACT inhaler Inhale 2 puffs into the  lungs every 6 (six) hours as needed for wheezing or shortness of breath.     allopurinol (ZYLOPRIM) 100 MG tablet TAKE 1 TABLET BY MOUTH EVERY DAY 90 tablet 1   AMOXICILLIN PO Take 500 mg by mouth. Before dental procedures     Budeson-Glycopyrrol-Formoterol (BREZTRI AEROSPHERE) 160-9-4.8 MCG/ACT AERO Inhale 2 puffs into the lungs 2 (two) times daily as needed ("for flares").     Cholecalciferol (VITAMIN D3) 25 MCG (1000 UT) CAPS Take 1,000 Units by mouth daily with lunch.     famotidine (PEPCID) 20 MG tablet Take 20 mg by mouth at bedtime as needed for heartburn or indigestion.     ferrous sulfate 325 (65 FE) MG tablet Take 325 mg by mouth See admin instructions. Take 325 mg by mouth with breakfast and supper     furosemide (LASIX) 40 MG tablet Take 40 mg by mouth See admin instructions. Take 40 mg by mouth in the morning (scheduled) and an additional 40 mg at bedtime if swelling is present. HOLD IF SYSTOLIC READING IS 123456 OR DIASTOLIC READING IS 123XX123.     hydroxychloroquine (PLAQUENIL) 200 MG tablet Take 1 tablet (200 mg total) by mouth daily. 90 tablet 1   levothyroxine (SYNTHROID) 25 MCG tablet Take 1 tablet (25 mcg total) by mouth daily. 90 tablet 3   magnesium oxide (MAG-OX) 400 MG tablet Take 1 tablet (400 mg total) by mouth daily.  90 tablet 2   metoprolol succinate (TOPROL-XL) 25 MG 24 hr tablet Take 1 tablet (25 mg total) by mouth daily. Hold dose if <110 SBP or <60 DBP (Patient taking differently: Take 25 mg by mouth See admin instructions. Take 25 mg by mouth once a day and HOLD IF systolic reading is <110 OR diastolic reading is <60) 90 tablet 3   Multiple Vitamins-Minerals (MULTIVITAMIN WITH MINERALS) tablet Take 1 tablet by mouth daily with lunch.     Nintedanib (OFEV) 150 MG CAPS Take 150 mg by mouth at bedtime.     OLANZapine (ZYPREXA) 2.5 MG tablet TAKE 1 TABLET BY MOUTH EVERYDAY AT BEDTIME 90 tablet 0   potassium chloride SA (KLOR-CON M) 20 MEQ tablet Take 1 tablet (20 mEq total) by  mouth daily. 90 tablet 1   predniSONE (DELTASONE) 5 MG tablet Take 1 tablet (5 mg total) by mouth daily with breakfast. Patient to double up dose during sick day rule 100 tablet 3   Rivaroxaban (XARELTO) 15 MG TABS tablet Take 1 tablet (15 mg total) by mouth daily with supper. 90 tablet 3   No current facility-administered medications for this encounter.    Allergies  Allergen Reactions   Propofol Other (See Comments)    "Caused asthma"   Flecainide Other (See Comments)    Reaction not recalled   Amiodarone Other (See Comments)    Delirium/Confusion/Psychosis   Amlodipine Other (See Comments)    "Tired and syncope"      Social History   Socioeconomic History   Marital status: Legally Separated    Spouse name: Not on file   Number of children: Not on file   Years of education: 16   Highest education level: Bachelor's degree (e.g., BA, AB, BS)  Occupational History   Occupation: Retired    Comment: Community education officer business  Tobacco Use   Smoking status: Never    Passive exposure: Past   Smokeless tobacco: Never  Vaping Use   Vaping Use: Never used  Substance and Sexual Activity   Alcohol use: Never   Drug use: Never   Sexual activity: Not Currently  Other Topics Concern   Not on file  Social History Narrative   Right Handed    Social Determinants of Health   Financial Resource Strain: Not on file  Food Insecurity: Not on file  Transportation Needs: Not on file  Physical Activity: Not on file  Stress: Not on file  Social Connections: Not on file  Intimate Partner Violence: Not on file      Family History  Problem Relation Age of Onset   Heart disease Mother    Hypertension Mother    Rheum arthritis Mother    Osteoarthritis Mother    Heart disease Father    Hypertension Father    Osteoarthritis Father    Heart disease Sister    Diabetes Brother    Cancer Brother     Vitals:   12/24/21 1458  BP: (!) 156/76  Pulse: 81  SpO2: 95%  Weight: 58.2 kg (128 lb  3.2 oz)     Wt Readings from Last 3 Encounters:  12/24/21 58.2 kg (128 lb 3.2 oz)  11/26/21 59.4 kg (131 lb)  11/06/21 56.2 kg (124 lb)    PHYSICAL EXAM: General:  Arrived in a wheel chair. No resp difficulty HEENT: normal Neck: supple. no JVD. Carotids 2+ bilat; no bruits. No lymphadenopathy or thryomegaly appreciated. Cor: PMI nondisplaced. Regular rate & rhythm. No rubs, gallops. 2/6 TR. Lungs:  clear Abdomen: soft, nontender, nondistended. No hepatosplenomegaly. No bruits or masses. Good bowel sounds. Extremities: no cyanosis, clubbing, rash, edema Neuro: alert & orientedx3, cranial nerves grossly intact. moves all 4 extremities w/o difficulty. Affect pleasant  EKG: V paced 71 bpm    ASSESSMENT & PLAN:  Chronic Diastolic CHF due to valvular heart disease -s/p bioprosthetic AVR/MV ring/bioprosthetic TVR 09/25/2010 -Echo 03/11/2020  LVEF 55 to 60%, moderate to severe tricuspid regurgitation, moderately elevated right ventricular systolic pressure estimate 42mm Hg, moderate mitral regurgitation.  RV not well visualized. AV function not assessed. - [facility] 11/22: Mild PAH. RA = 11 (v waves to 14) PA = 42/15 (25) PCW = 10 Fick cardiac output/index = 5.7/3.7 PVR = 2.1 WU MVA (LV-wedge tracing) = 3.5cm2 mean gradient across MV 3.102mmHG - NYHA III. [employer] - [employer] up. Activity < 1 hour.100% Pacing.  - Volume status stable.  Continue lasix 40 mg daily with an extra 40 mg as needed.   - Set up ECHO. May need to repeat TEE if valvular disease worse.   2. Pulmonary HTN by ECHO - [facility] 11/22 with mild PAH and normal PVR. (See above)  3. ILD -11/2018 HRCT with stable ILD compared with 2018.  2018 CT Slowly progressive basal predominant, moderately severe pulmonary fibrosis. Favor NSIP -no visible PFT's on care everywhere -Needs follow up with [facility].   4. OSA -resolved with weight loss and cpap discontinued  5. Permanent Afib with SSS - has ICD in place due to h/o VT  -Followed by  Dr. Quentin Huber, 04/2020 was 99% V pacing.   - On xarelto  6. H/o VT - ICD in palce  7. SLE -followed by Dr. Benjamine Mola, lupus felt to be under good control -continued on HCQ and she is also on 5mg  prednisone for secondary adrenal insufficiency  8. H/o diverticular bleed - seems to have resolved  9. Chest Discomfort Suspect musculoskeletal with recent falls.    Follow up with Dr Haroldine Laws in 3 months and an ECHO  Darrick Grinder, NP  3:01 PM

## 2021-12-24 NOTE — Patient Instructions (Addendum)
No medication changes today. Return to see Dr. [name] with echo in 2 - 3 months. Call Dr. Edson Snowball (Pulmonology) at [phone] to schedule appointment.

## 2021-12-26 ENCOUNTER — Encounter (HOSPITAL_BASED_OUTPATIENT_CLINIC_OR_DEPARTMENT_OTHER): Payer: Self-pay | Admitting: Emergency Medicine

## 2021-12-26 ENCOUNTER — Emergency Department (HOSPITAL_BASED_OUTPATIENT_CLINIC_OR_DEPARTMENT_OTHER): Payer: Medicare HMO

## 2021-12-26 ENCOUNTER — Other Ambulatory Visit: Payer: Self-pay

## 2021-12-26 ENCOUNTER — Inpatient Hospital Stay (HOSPITAL_BASED_OUTPATIENT_CLINIC_OR_DEPARTMENT_OTHER)
Admission: EM | Admit: 2021-12-26 | Discharge: 2021-12-29 | DRG: 811 | Disposition: A | Discharge status: Home / Self Care | Payer: Medicare HMO | Attending: Internal Medicine | Admitting: Internal Medicine

## 2021-12-26 ENCOUNTER — Encounter (HOSPITAL_COMMUNITY): Payer: Self-pay

## 2021-12-26 DIAGNOSIS — I4821 Permanent atrial fibrillation: Secondary | ICD-10-CM | POA: Diagnosis present

## 2021-12-26 DIAGNOSIS — Z8249 Family history of ischemic heart disease and other diseases of the circulatory system: Secondary | ICD-10-CM

## 2021-12-26 DIAGNOSIS — R0602 Shortness of breath: Secondary | ICD-10-CM

## 2021-12-26 DIAGNOSIS — I700 Atherosclerosis of aorta: Secondary | ICD-10-CM | POA: Diagnosis present

## 2021-12-26 DIAGNOSIS — Z79890 Hormone replacement therapy (postmenopausal): Secondary | ICD-10-CM

## 2021-12-26 DIAGNOSIS — E2749 Other adrenocortical insufficiency: Secondary | ICD-10-CM | POA: Diagnosis present

## 2021-12-26 DIAGNOSIS — M069 Rheumatoid arthritis, unspecified: Secondary | ICD-10-CM | POA: Diagnosis present

## 2021-12-26 DIAGNOSIS — Z953 Presence of xenogenic heart valve: Secondary | ICD-10-CM

## 2021-12-26 DIAGNOSIS — D62 Acute posthemorrhagic anemia: Secondary | ICD-10-CM | POA: Diagnosis present

## 2021-12-26 DIAGNOSIS — E039 Hypothyroidism, unspecified: Secondary | ICD-10-CM | POA: Diagnosis present

## 2021-12-26 DIAGNOSIS — J810 Acute pulmonary edema: Secondary | ICD-10-CM

## 2021-12-26 DIAGNOSIS — D638 Anemia in other chronic diseases classified elsewhere: Secondary | ICD-10-CM | POA: Diagnosis present

## 2021-12-26 DIAGNOSIS — J84112 Idiopathic pulmonary fibrosis: Secondary | ICD-10-CM | POA: Diagnosis present

## 2021-12-26 DIAGNOSIS — R1312 Dysphagia, oropharyngeal phase: Secondary | ICD-10-CM | POA: Diagnosis present

## 2021-12-26 DIAGNOSIS — I272 Pulmonary hypertension, unspecified: Secondary | ICD-10-CM | POA: Diagnosis present

## 2021-12-26 DIAGNOSIS — I429 Cardiomyopathy, unspecified: Secondary | ICD-10-CM | POA: Diagnosis present

## 2021-12-26 DIAGNOSIS — N179 Acute kidney failure, unspecified: Secondary | ICD-10-CM | POA: Diagnosis not present

## 2021-12-26 DIAGNOSIS — W1830XA Fall on same level, unspecified, initial encounter: Secondary | ICD-10-CM | POA: Diagnosis present

## 2021-12-26 DIAGNOSIS — I4819 Other persistent atrial fibrillation: Secondary | ICD-10-CM | POA: Diagnosis not present

## 2021-12-26 DIAGNOSIS — R296 Repeated falls: Secondary | ICD-10-CM

## 2021-12-26 DIAGNOSIS — I442 Atrioventricular block, complete: Secondary | ICD-10-CM | POA: Diagnosis present

## 2021-12-26 DIAGNOSIS — E042 Nontoxic multinodular goiter: Secondary | ICD-10-CM | POA: Diagnosis present

## 2021-12-26 DIAGNOSIS — Z95810 Presence of automatic (implantable) cardiac defibrillator: Secondary | ICD-10-CM | POA: Diagnosis not present

## 2021-12-26 DIAGNOSIS — I130 Hypertensive heart and chronic kidney disease with heart failure and stage 1 through stage 4 chronic kidney disease, or unspecified chronic kidney disease: Secondary | ICD-10-CM | POA: Diagnosis present

## 2021-12-26 DIAGNOSIS — M329 Systemic lupus erythematosus, unspecified: Secondary | ICD-10-CM | POA: Diagnosis present

## 2021-12-26 DIAGNOSIS — E441 Mild protein-calorie malnutrition: Secondary | ICD-10-CM | POA: Diagnosis present

## 2021-12-26 DIAGNOSIS — I959 Hypotension, unspecified: Secondary | ICD-10-CM | POA: Diagnosis present

## 2021-12-26 DIAGNOSIS — G4733 Obstructive sleep apnea (adult) (pediatric): Secondary | ICD-10-CM | POA: Diagnosis present

## 2021-12-26 DIAGNOSIS — F03A2 Unspecified dementia, mild, with psychotic disturbance: Secondary | ICD-10-CM | POA: Diagnosis present

## 2021-12-26 DIAGNOSIS — D509 Iron deficiency anemia, unspecified: Secondary | ICD-10-CM | POA: Diagnosis present

## 2021-12-26 DIAGNOSIS — M1A00X0 Idiopathic chronic gout, unspecified site, without tophus (tophi): Secondary | ICD-10-CM | POA: Diagnosis not present

## 2021-12-26 DIAGNOSIS — I5032 Chronic diastolic (congestive) heart failure: Secondary | ICD-10-CM | POA: Diagnosis present

## 2021-12-26 DIAGNOSIS — E785 Hyperlipidemia, unspecified: Secondary | ICD-10-CM | POA: Diagnosis present

## 2021-12-26 DIAGNOSIS — J449 Chronic obstructive pulmonary disease, unspecified: Secondary | ICD-10-CM | POA: Diagnosis present

## 2021-12-26 DIAGNOSIS — D849 Immunodeficiency, unspecified: Secondary | ICD-10-CM | POA: Diagnosis present

## 2021-12-26 DIAGNOSIS — Z7951 Long term (current) use of inhaled steroids: Secondary | ICD-10-CM

## 2021-12-26 DIAGNOSIS — I2489 Other forms of acute ischemic heart disease: Secondary | ICD-10-CM | POA: Diagnosis present

## 2021-12-26 DIAGNOSIS — I5033 Acute on chronic diastolic (congestive) heart failure: Secondary | ICD-10-CM | POA: Diagnosis present

## 2021-12-26 DIAGNOSIS — K5901 Slow transit constipation: Secondary | ICD-10-CM | POA: Diagnosis present

## 2021-12-26 DIAGNOSIS — D649 Anemia, unspecified: Secondary | ICD-10-CM | POA: Diagnosis not present

## 2021-12-26 DIAGNOSIS — F03A0 Unspecified dementia, mild, without behavioral disturbance, psychotic disturbance, mood disturbance, and anxiety: Secondary | ICD-10-CM | POA: Diagnosis present

## 2021-12-26 DIAGNOSIS — Z833 Family history of diabetes mellitus: Secondary | ICD-10-CM

## 2021-12-26 DIAGNOSIS — S7001XA Contusion of right hip, initial encounter: Secondary | ICD-10-CM | POA: Diagnosis present

## 2021-12-26 DIAGNOSIS — I083 Combined rheumatic disorders of mitral, aortic and tricuspid valves: Secondary | ICD-10-CM | POA: Diagnosis present

## 2021-12-26 DIAGNOSIS — R195 Other fecal abnormalities: Secondary | ICD-10-CM | POA: Diagnosis not present

## 2021-12-26 DIAGNOSIS — J309 Allergic rhinitis, unspecified: Secondary | ICD-10-CM | POA: Diagnosis present

## 2021-12-26 DIAGNOSIS — Z7952 Long term (current) use of systemic steroids: Secondary | ICD-10-CM

## 2021-12-26 DIAGNOSIS — Z8679 Personal history of other diseases of the circulatory system: Secondary | ICD-10-CM

## 2021-12-26 DIAGNOSIS — N1831 Chronic kidney disease, stage 3a: Secondary | ICD-10-CM | POA: Diagnosis present

## 2021-12-26 DIAGNOSIS — Z8261 Family history of arthritis: Secondary | ICD-10-CM

## 2021-12-26 DIAGNOSIS — Z888 Allergy status to other drugs, medicaments and biological substances status: Secondary | ICD-10-CM

## 2021-12-26 DIAGNOSIS — M1A9XX0 Chronic gout, unspecified, without tophus (tophi): Secondary | ICD-10-CM | POA: Diagnosis present

## 2021-12-26 DIAGNOSIS — N183 Chronic kidney disease, stage 3 unspecified: Secondary | ICD-10-CM | POA: Diagnosis present

## 2021-12-26 DIAGNOSIS — E7849 Other hyperlipidemia: Secondary | ICD-10-CM | POA: Diagnosis present

## 2021-12-26 DIAGNOSIS — Z79899 Other long term (current) drug therapy: Secondary | ICD-10-CM

## 2021-12-26 DIAGNOSIS — D6832 Hemorrhagic disorder due to extrinsic circulating anticoagulants: Secondary | ICD-10-CM | POA: Diagnosis present

## 2021-12-26 DIAGNOSIS — D6869 Other thrombophilia: Secondary | ICD-10-CM | POA: Diagnosis present

## 2021-12-26 DIAGNOSIS — D8481 Immunodeficiency due to conditions classified elsewhere: Secondary | ICD-10-CM | POA: Diagnosis present

## 2021-12-26 DIAGNOSIS — I10 Essential (primary) hypertension: Secondary | ICD-10-CM | POA: Diagnosis not present

## 2021-12-26 DIAGNOSIS — Z7901 Long term (current) use of anticoagulants: Secondary | ICD-10-CM | POA: Diagnosis not present

## 2021-12-26 DIAGNOSIS — E261 Secondary hyperaldosteronism: Secondary | ICD-10-CM | POA: Diagnosis present

## 2021-12-26 DIAGNOSIS — Z1152 Encounter for screening for COVID-19: Secondary | ICD-10-CM | POA: Diagnosis not present

## 2021-12-26 DIAGNOSIS — K219 Gastro-esophageal reflux disease without esophagitis: Secondary | ICD-10-CM | POA: Diagnosis present

## 2021-12-26 DIAGNOSIS — Z8673 Personal history of transient ischemic attack (TIA), and cerebral infarction without residual deficits: Secondary | ICD-10-CM | POA: Diagnosis present

## 2021-12-26 DIAGNOSIS — G459 Transient cerebral ischemic attack, unspecified: Secondary | ICD-10-CM | POA: Diagnosis present

## 2021-12-26 DIAGNOSIS — I4891 Unspecified atrial fibrillation: Secondary | ICD-10-CM | POA: Diagnosis present

## 2021-12-26 DIAGNOSIS — I495 Sick sinus syndrome: Secondary | ICD-10-CM | POA: Diagnosis present

## 2021-12-26 DIAGNOSIS — I7300 Raynaud's syndrome without gangrene: Secondary | ICD-10-CM | POA: Diagnosis present

## 2021-12-26 LAB — CBC WITH DIFFERENTIAL/PLATELET
Abs Immature Granulocytes: 0.02 10*3/uL (ref 0.00–0.07)
Basophils Absolute: 0 10*3/uL (ref 0.0–0.1)
Basophils Relative: 0 %
Eosinophils Absolute: 0 10*3/uL (ref 0.0–0.5)
Eosinophils Relative: 0 %
HCT: 24.3 % — ABNORMAL LOW (ref 36.0–46.0)
Hemoglobin: 7.9 g/dL — ABNORMAL LOW (ref 12.0–15.0)
Immature Granulocytes: 0 %
Lymphocytes Relative: 20 %
Lymphs Abs: 1.4 10*3/uL (ref 0.7–4.0)
MCH: 33.1 pg (ref 26.0–34.0)
MCHC: 32.5 g/dL (ref 30.0–36.0)
MCV: 101.7 fL — ABNORMAL HIGH (ref 80.0–100.0)
Monocytes Absolute: 0.6 10*3/uL (ref 0.1–1.0)
Monocytes Relative: 8 %
Neutro Abs: 5.1 10*3/uL (ref 1.7–7.7)
Neutrophils Relative %: 72 %
Platelets: 179 10*3/uL (ref 150–400)
RBC: 2.39 MIL/uL — ABNORMAL LOW (ref 3.87–5.11)
RDW: 14.4 % (ref 11.5–15.5)
WBC: 7 10*3/uL (ref 4.0–10.5)
nRBC: 0.7 % — ABNORMAL HIGH (ref 0.0–0.2)

## 2021-12-26 LAB — CBC
HCT: 23.5 % — ABNORMAL LOW (ref 36.0–46.0)
Hemoglobin: 7.5 g/dL — ABNORMAL LOW (ref 12.0–15.0)
MCH: 33.8 pg (ref 26.0–34.0)
MCHC: 31.9 g/dL (ref 30.0–36.0)
MCV: 105.9 fL — ABNORMAL HIGH (ref 80.0–100.0)
Platelets: 184 10*3/uL (ref 150–400)
RBC: 2.22 MIL/uL — ABNORMAL LOW (ref 3.87–5.11)
RDW: 14.7 % (ref 11.5–15.5)
WBC: 7.8 10*3/uL (ref 4.0–10.5)
nRBC: 0.5 % — ABNORMAL HIGH (ref 0.0–0.2)

## 2021-12-26 LAB — BRAIN NATRIURETIC PEPTIDE: B Natriuretic Peptide: 718 pg/mL — ABNORMAL HIGH (ref 0.0–100.0)

## 2021-12-26 LAB — BASIC METABOLIC PANEL
Anion gap: 9 (ref 5–15)
BUN: 39 mg/dL — ABNORMAL HIGH (ref 8–23)
CO2: 22 mmol/L (ref 22–32)
Calcium: 8.8 mg/dL — ABNORMAL LOW (ref 8.9–10.3)
Chloride: 108 mmol/L (ref 98–111)
Creatinine, Ser: 1.22 mg/dL — ABNORMAL HIGH (ref 0.44–1.00)
GFR, Estimated: 47 mL/min — ABNORMAL LOW (ref >60)
Glucose, Bld: 80 mg/dL (ref 70–99)
Potassium: 3.8 mmol/L (ref 3.5–5.1)
Sodium: 139 mmol/L (ref 135–145)

## 2021-12-26 LAB — TROPONIN I (HIGH SENSITIVITY)
Troponin I (High Sensitivity): 125 ng/L (ref <18)
Troponin I (High Sensitivity): 127 ng/L (ref <18)

## 2021-12-26 LAB — OCCULT BLOOD X 1 CARD TO LAB, STOOL: Fecal Occult Bld: NEGATIVE

## 2021-12-26 LAB — D-DIMER, QUANTITATIVE: D-Dimer, Quant: 0.35 ug/mL-FEU (ref 0.00–0.50)

## 2021-12-26 LAB — RESP PANEL BY RT-PCR (FLU A&B, COVID) ARPGX2
Influenza A by PCR: NEGATIVE
Influenza B by PCR: NEGATIVE
SARS Coronavirus 2 by RT PCR: NEGATIVE

## 2021-12-26 MED ORDER — OLANZAPINE 5 MG PO TABS
2.5000 mg | ORAL_TABLET | Freq: Every day | ORAL | Status: DC
  Administered 2021-12-26 – 2021-12-28 (×3): 2.5 mg via ORAL
  Filled 2021-12-26 (×3): qty 1

## 2021-12-26 MED ORDER — PANTOPRAZOLE SODIUM 40 MG IV SOLR
INTRAVENOUS | Status: AC
  Filled 2021-12-26: qty 20

## 2021-12-26 MED ORDER — PANTOPRAZOLE INFUSION (NEW) - SIMPLE MED
8.0000 mg/h | INTRAVENOUS | Status: AC
  Administered 2021-12-26 – 2021-12-29 (×7): 8 mg/h via INTRAVENOUS
  Filled 2021-12-26 (×2): qty 80
  Filled 2021-12-26: qty 100
  Filled 2021-12-26 (×2): qty 80
  Filled 2021-12-26: qty 100
  Filled 2021-12-26: qty 80
  Filled 2021-12-26: qty 100
  Filled 2021-12-26: qty 80

## 2021-12-26 MED ORDER — METOPROLOL SUCCINATE ER 25 MG PO TB24
25.0000 mg | ORAL_TABLET | Freq: Every day | ORAL | Status: DC
  Administered 2021-12-27 – 2021-12-29 (×3): 25 mg via ORAL
  Filled 2021-12-26 (×4): qty 1

## 2021-12-26 MED ORDER — ACETAMINOPHEN 325 MG PO TABS: 650.0000 mg | ORAL_TABLET | ORAL | Status: DC | PRN

## 2021-12-26 MED ORDER — NINTEDANIB ESYLATE 150 MG PO CAPS
150.0000 mg | ORAL_CAPSULE | Freq: Every day | ORAL | Status: DC
  Administered 2021-12-27 – 2021-12-28 (×2): 150 mg via ORAL

## 2021-12-26 MED ORDER — SODIUM CHLORIDE 0.9% FLUSH
3.0000 mL | Freq: Two times a day (BID) | INTRAVENOUS | Status: DC
  Administered 2021-12-26 – 2021-12-28 (×5): 3 mL via INTRAVENOUS

## 2021-12-26 MED ORDER — SENNOSIDES-DOCUSATE SODIUM 8.6-50 MG PO TABS: 1.0000 | ORAL_TABLET | Freq: Every evening | ORAL | Status: DC | PRN

## 2021-12-26 MED ORDER — SODIUM CHLORIDE 0.9% FLUSH: 3.0000 mL | INTRAVENOUS | Status: DC | PRN

## 2021-12-26 MED ORDER — ACETAMINOPHEN 325 MG PO TABS
650.0000 mg | ORAL_TABLET | Freq: Four times a day (QID) | ORAL | Status: DC | PRN
  Administered 2021-12-27: 650 mg via ORAL
  Filled 2021-12-26: qty 2

## 2021-12-26 MED ORDER — HYDRALAZINE HCL 20 MG/ML IJ SOLN: 5.0000 mg | INTRAMUSCULAR | Status: DC | PRN

## 2021-12-26 MED ORDER — PANTOPRAZOLE 80MG IVPB - SIMPLE MED
80.0000 mg | Freq: Once | INTRAVENOUS | Status: AC
  Administered 2021-12-26: 80 mg via INTRAVENOUS
  Filled 2021-12-26: qty 100

## 2021-12-26 MED ORDER — ACETAMINOPHEN 650 MG RE SUPP: 650.0000 mg | Freq: Four times a day (QID) | RECTAL | Status: DC | PRN

## 2021-12-26 MED ORDER — RIVAROXABAN 15 MG PO TABS: 15.0000 mg | ORAL_TABLET | Freq: Every day | ORAL | Status: DC

## 2021-12-26 MED ORDER — SODIUM CHLORIDE 0.9 % IV SOLN: 250.0000 mL | INTRAVENOUS | Status: DC | PRN

## 2021-12-26 MED ORDER — ALLOPURINOL 100 MG PO TABS
100.0000 mg | ORAL_TABLET | Freq: Every day | ORAL | Status: DC
  Administered 2021-12-26 – 2021-12-29 (×4): 100 mg via ORAL
  Filled 2021-12-26 (×4): qty 1

## 2021-12-26 MED ORDER — SODIUM CHLORIDE 0.9% IV SOLUTION: Freq: Once | INTRAVENOUS | Status: AC

## 2021-12-26 MED ORDER — FUROSEMIDE 10 MG/ML IJ SOLN
40.0000 mg | Freq: Two times a day (BID) | INTRAMUSCULAR | Status: DC
  Administered 2021-12-27 (×2): 40 mg via INTRAVENOUS
  Filled 2021-12-26 (×3): qty 4

## 2021-12-26 MED ORDER — ONDANSETRON HCL 4 MG/2ML IJ SOLN: 4.0000 mg | Freq: Four times a day (QID) | INTRAMUSCULAR | Status: DC | PRN

## 2021-12-26 MED ORDER — PREDNISONE 5 MG PO TABS
5.0000 mg | ORAL_TABLET | Freq: Every day | ORAL | Status: DC
  Administered 2021-12-27 – 2021-12-29 (×3): 5 mg via ORAL
  Filled 2021-12-26 (×3): qty 1

## 2021-12-26 MED ORDER — HYDROXYCHLOROQUINE SULFATE 200 MG PO TABS
200.0000 mg | ORAL_TABLET | Freq: Every day | ORAL | Status: DC
  Administered 2021-12-26 – 2021-12-29 (×4): 200 mg via ORAL
  Filled 2021-12-26 (×4): qty 1

## 2021-12-26 NOTE — ED Provider Notes (Signed)
[facility] EMERGENCY DEPARTMENT Provider Note   CSN: [id number] Arrival date & time: 12/26/21  1216     History Past medical history includes HLD,  HFpEF with last echo in 2022 with normal EF, SLE, pulmonary fibrosis, pulmonary hypertension, COPD, V. tach with ICD placed, a fib on xaralto, OSA, valvular disease status post TVR, hx of GI bleed, hypothyroidism, CKD stage III Chief Complaint  Patient presents with   Shortness of Breath    Lindsey Huber is a 76 y.o. female.  Patient is presenting with a chief complaint of shortness of breath that has been ongoing for the past 1 week.  It is worse with exertion.  She has had a couple episodes of associated left-sided chest pain, but she does have these regularly and this is not new for her.  She has not had any relief with her home inhaler.  She has been compliant with her 40 mg Lasix daily, however  she did not take her dose today.  She has not noticed any swelling of her legs.  She is on Xarelto and has not missed any doses recently.  She has no history of blood clots.  She denies any abdominal pain, nausea, vomiting, dizziness, numbness or weakness, fevers, chills, cough, sore throat.   Shortness of Breath Associated symptoms: chest pain        Home Medications Prior to Admission medications   Medication Sig Start Date End Date Taking? Authorizing Provider  acetaminophen (TYLENOL) 500 MG tablet Take 1,000 mg by mouth every 6 (six) hours as needed (pain).    [provider]  albuterol (VENTOLIN HFA) 108 (90 Base) MCG/ACT inhaler Inhale 2 puffs into the lungs every 6 (six) hours as needed for wheezing or shortness of breath.    [provider]  allopurinol (ZYLOPRIM) 100 MG tablet TAKE 1 TABLET BY MOUTH EVERY DAY 11/20/21   Rice, Jamesetta Orleans, MD  AMOXICILLIN PO Take 500 mg by mouth. Before dental procedures    [provider]  Budeson-Glycopyrrol-Formoterol (BREZTRI AEROSPHERE) 160-9-4.8 MCG/ACT  AERO Inhale 2 puffs into the lungs 2 (two) times daily as needed ("for flares").    [provider]  Cholecalciferol (VITAMIN D3) 25 MCG (1000 UT) CAPS Take 1,000 Units by mouth daily with lunch.    [provider]  famotidine (PEPCID) 20 MG tablet Take 20 mg by mouth at bedtime as needed for heartburn or indigestion. 07/03/21   [provider]  ferrous sulfate 325 (65 FE) MG tablet Take 325 mg by mouth See admin instructions. Take 325 mg by mouth with breakfast and supper    [provider]  furosemide (LASIX) 40 MG tablet Take 40 mg by mouth See admin instructions. Take 40 mg by mouth in the morning (scheduled) and an additional 40 mg at bedtime if swelling is present. HOLD IF SYSTOLIC READING IS <110 OR DIASTOLIC READING IS <60.    [provider]  hydroxychloroquine (PLAQUENIL) 200 MG tablet Take 1 tablet (200 mg total) by mouth daily. 04/10/21   Rice, Jamesetta Orleans, MD  levothyroxine (SYNTHROID) 25 MCG tablet Take 1 tablet (25 mcg total) by mouth daily. 11/06/21   Shamleffer, Konrad Dolores, MD  magnesium oxide (MAG-OX) 400 MG tablet Take 1 tablet (400 mg total) by mouth daily. 11/27/21   Sheilah Pigeon, PA-C  metoprolol succinate (TOPROL-XL) 25 MG 24 hr tablet Take 1 tablet (25 mg total) by mouth daily. Hold dose if <110 SBP or <60 DBP Patient taking differently: Take  25 mg by mouth See admin instructions. Take 25 mg by mouth once a day and HOLD IF systolic reading is <110 OR diastolic reading is <60 04/23/21   Bensimhon, Bevelyn Buckles, MD  Multiple Vitamins-Minerals (MULTIVITAMIN WITH MINERALS) tablet Take 1 tablet by mouth daily with lunch.    [provider]  Nintedanib (OFEV) 150 MG CAPS Take 150 mg by mouth at bedtime.    [provider]  OLANZapine (ZYPREXA) 2.5 MG tablet TAKE 1 TABLET BY MOUTH EVERYDAY AT BEDTIME 12/23/21   Jaffe, Adam R, DO  potassium chloride SA (KLOR-CON M) 20 MEQ tablet Take 1 tablet (20 mEq total) by mouth daily.  11/27/21   Sheilah Pigeon, PA-C  predniSONE (DELTASONE) 5 MG tablet Take 1 tablet (5 mg total) by mouth daily with breakfast. Patient to double up dose during sick day rule 11/06/21   Shamleffer, Konrad Dolores, MD  Rivaroxaban (XARELTO) 15 MG TABS tablet Take 1 tablet (15 mg total) by mouth daily with supper. 04/23/21   Bensimhon, Bevelyn Buckles, MD      Allergies    Propofol, Flecainide, Amiodarone, and Amlodipine    Review of Systems   Review of Systems  Respiratory:  Positive for shortness of breath.   Cardiovascular:  Positive for chest pain.  All other systems reviewed and are negative.   Physical Exam Updated Vital Signs BP 139/68   Pulse 69   Temp 98.2 F (36.8 C) (Oral)   Resp (!) 22   Ht 5\' 3"  (1.6 m)   Wt 59 kg   SpO2 100%   BMI 23.03 kg/m  Physical Exam Vitals and nursing note reviewed.  Constitutional:      General: She is not in acute distress.    Appearance: Normal appearance. She is not ill-appearing, toxic-appearing or diaphoretic.     Interventions: She is not intubated. HENT:     Head: Normocephalic and atraumatic.     Nose: No nasal deformity.     Mouth/Throat:     Lips: Pink. No lesions.     Mouth: No injury, lacerations, oral lesions or angioedema.     Pharynx: Uvula midline. No posterior oropharyngeal erythema or uvula swelling.  Eyes:     General: Gaze aligned appropriately. No scleral icterus.       Right eye: No discharge.        Left eye: No discharge.     Conjunctiva/sclera: Conjunctivae normal.     Right eye: Right conjunctiva is not injected. No exudate or hemorrhage.    Left eye: Left conjunctiva is not injected. No exudate or hemorrhage. Neck:     Vascular: No JVD.  Cardiovascular:     Rate and Rhythm: Normal rate and regular rhythm.     Pulses: Normal pulses.          Radial pulses are 2+ on the right side and 2+ on the left side.       Dorsalis pedis pulses are 2+ on the right side and 2+ on the left side.     Heart sounds: Normal  heart sounds, S1 normal and S2 normal. Heart sounds not distant. No murmur heard.    No friction rub. No gallop. No S3 or S4 sounds.  Pulmonary:     Effort: Pulmonary effort is normal. No tachypnea, accessory muscle usage or respiratory distress. She is not intubated.     Breath sounds: Normal breath sounds. No stridor. No decreased breath sounds, wheezing, rhonchi or rales.     Comments:  Mild dyspnea noted Moving air well, no wheezing Chest:     Chest wall: No tenderness.  Abdominal:     General: Abdomen is flat. Bowel sounds are normal. There is no distension.     Palpations: Abdomen is soft. There is no mass or pulsatile mass.     Tenderness: There is no abdominal tenderness. There is no guarding or rebound.  Genitourinary:    Rectum: Guaiac result negative.  Musculoskeletal:     Right lower leg: No tenderness. No edema.     Left lower leg: No tenderness. No edema.     Comments: Large nonpulsatile right hip hematoma  Skin:    General: Skin is warm and dry.     Coloration: Skin is not jaundiced or pale.     Findings: No bruising, erythema, lesion or rash.  Neurological:     General: No focal deficit present.     Mental Status: She is alert and oriented to person, place, and time.     GCS: GCS eye subscore is 4. GCS verbal subscore is 5. GCS motor subscore is 6.  Psychiatric:        Mood and Affect: Mood normal. Mood is not anxious.        Behavior: Behavior normal. Behavior is cooperative.     ED Results / Procedures / Treatments   Labs (all labs ordered are listed, but only abnormal results are displayed) Labs Reviewed  BASIC METABOLIC PANEL - Abnormal; Notable for the following components:      Result Value   BUN 39 (*)    Creatinine, Ser 1.22 (*)    Calcium 8.8 (*)    GFR, Estimated 47 (*)    All other components within normal limits  CBC WITH DIFFERENTIAL/PLATELET - Abnormal; Notable for the following components:   RBC 2.39 (*)    Hemoglobin 7.9 (*)    HCT 24.3  (*)    MCV 101.7 (*)    nRBC 0.7 (*)    All other components within normal limits  BRAIN NATRIURETIC PEPTIDE - Abnormal; Notable for the following components:   B Natriuretic Peptide 718.0 (*)    All other components within normal limits  TROPONIN I (HIGH SENSITIVITY) - Abnormal; Notable for the following components:   Troponin I (High Sensitivity) 125 (*)    All other components within normal limits  TROPONIN I (HIGH SENSITIVITY) - Abnormal; Notable for the following components:   Troponin I (High Sensitivity) 127 (*)    All other components within normal limits  RESP PANEL BY RT-PCR (FLU A&B, COVID) ARPGX2  D-DIMER, QUANTITATIVE  OCCULT BLOOD X 1 CARD TO LAB, STOOL  CBC    EKG None  Radiology DG Chest 2 View  Result Date: 12/26/2021 CLINICAL DATA:  Shortness of breath EXAM: CHEST - 2 VIEW COMPARISON:  July 30, 2021 FINDINGS: The heart size and mediastinal contours are stable. Cardiomegaly identified. Cardiac pacemaker with cardiac valvular replacement rings are unchanged. Mild central pulmonary vascular congestion noted. No focal pneumonia or pleural effusion noted. The visualized skeletal structures are stable. IMPRESSION: Mild central pulmonary vascular congestion and cardiomegaly. Electronically Signed   By: Sherian Rein M.D.   On: 12/26/2021 13:16    Procedures Procedures  This patient was on telemetry or cardiac monitoring during their time in the ED.    Medications Ordered in ED Medications  pantoprozole (PROTONIX) 80 mg /NS 100 mL infusion (8 mg/hr Intravenous New Bag/Given 12/26/21 1658)  pantoprazole (PROTONIX) 40 MG injection (has no  administration in time range)  pantoprazole (PROTONIX) 40 MG injection (has no administration in time range)  pantoprazole (PROTONIX) 80 mg /NS 100 mL IVPB (0 mg Intravenous Stopped 12/26/21 1658)    ED Course/ Medical Decision Making/ A&P Clinical Course as of 12/26/21 1727  Sat Dec 26, 2021  1409 Hemoglobin(!): 7.9 Hemoglobin have  dropped from 10.9-7.9 over the past 2 months.  I did further questioning of the patient, she does say that she has noticed some black stools yesterday and intermittently over the past week or 2.  She has not noticed any bleeding from anywhere else.  She does report history of GI bleed in 2022 [GL]  1429 B Natriuretic Peptide(!): 718.0 [GL]  1449 Troponin I (High Sensitivity)(!!): 125 Up from prior in mid 90s. Plan to repeat. Suspect demand from acute anemia  [GL]  1454 No gross blood on rectal exam. Sent for hem occult card  [GL]  1503 Noticed large hematoma of right hip. XR was negative at urgent care. Apparently getting better per patient family member  [GL]  1503 Fecal Occult Blood, POC: NEGATIVE [GL]  1533 My attending physician has personally seen and evaluated this patient. She recommends GI consult, but anemia could be coming from stool versus hip hematoma. Plan for admission for symptomatic anemia, demand ischemia, and monitoring. [GL]  1546 I discussed case with Dr Ewing Schlein with GI. He will consult on her while in hospital  [GL]  1600 I discussed case with [facility] who agrees to accept patient for transfer.  [GL]    Clinical Course User Index [GL] Victorino Dike Finis Bud, PA-C                           Medical Decision Making Amount and/or Complexity of Data Reviewed Labs: ordered. Decision-making details documented in ED Course. Radiology: ordered.  Risk Decision regarding hospitalization.    MDM  This is a 76 y.o. female who presents to the ED with shortness of breath for one week The differential of this patient includes but is not limited to CHF, ACS, COPD, Asthma, PNA, PE, PTX, Anxiety, Viral PNA, and Bronchitis, anemia  Initial Impression  Patient is chronically ill-appearing in no acute distress She is afebrile with stable vitals.  She is not hypoxic. She does not appear hypervolemic on exam.  Her lungs are clear and she is moving air well.  Will evaluate with EKG, chest x-ray,  CBC, BMP, BNP, troponins, and d dimer.  I personally ordered, reviewed, and interpreted all laboratory work and imaging and agree with radiologist interpretation. Results interpreted below:  No leukocytosis or anemia noted.  Electrolytes are okay, creatinine is 1.22 but stable from baseline.  D-dimer normal.  BNP is 718 which is a slight increase from her baseline.  Troponin is 125 -127 on second repeat.    Fecal occult is negative.  Negative for COVID and flu.  Chest x-ray reveals mild vascular congestion.  No sign of pneumonia or pneumothorax.  EKG shows paced rhythm with no change from prior  Assessment/Plan:  Patient has acute symptomatic anemia with a hemoglobin drop from 10.9-7.9 over the past 2 months.  She is on Xarelto and took her last dose yesterday.  She has associated demand ischemia with troponin to 125.  Possible sources include GI versus right hip hematoma.  She does endorse having some dark stools this week and has history of prior GI bleed.  She did have a negative Hemoccult.  I  have prophylactically consulted Dr. Ewing Schlein who is on GI service today.  He will plan to follow patient while in the hospital. I have started her on Protonix gtt.   [facility] will admit patient. Plan to recheck CBC around 6 pm to make sure hgb is stable.    Charting Requirements Additional history is obtained from:  Independent historian External Records from outside source obtained and reviewed including: Recent cardiology note, prior admission for GI bleed Social Determinants of Health:  none Pertinant PMH that complicates patient's illness: on xaralto, CHF, ICD in place,pulmonary fibrosis, COPD  Patient Care Problems that were addressed during this visit: - Symptomatic anemia: Acute illness with systemic symptoms - Dark STools: Acute illness with complication - Right hip hematoma: Acute illness with complication This patient was maintained on a cardiac monitor/telemetry. I personally viewed and  interpreted the cardiac monitor which reveals an underlying rhythm of paced rhythm Medications given in ED: Protonix Reevaluation of the patient after these medicines showed that the patient stayed the same I have reviewed home medications and made changes accordingly.  Critical Care Interventions: n/a Consultations: Gastroenterology, [facility] Disposition: admit  This is a shared visit with my attending physician, Dr. Wallace Cullens.  We have discussed this patient and they have independently evaluated this patient. The plan was altered or changed as needed.  Portions of this note were generated with Scientist, clinical (histocompatibility and immunogenetics). Dictation errors may occur despite best attempts at proofreading.      Final Clinical Impression(s) / ED Diagnoses Final diagnoses:  Symptomatic anemia  Dark stools  Hematoma of right hip, initial encounter    Rx / DC Orders ED Discharge Orders     None         Claudie Leach, PA-C 12/26/21 1727    Franne Forts, DO 12/30/21 [id number]

## 2021-12-26 NOTE — ED Notes (Signed)
Report given to CL, Carylon Perches.

## 2021-12-26 NOTE — H&P (Incomplete)
PCP:   Collene Mares, PA   Chief Complaint: Sudden onset shortness of breath   HPI: This is a 76 year old female with a complex past medical history.  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  Patient fell approximately 1 week ago, her daughter took her for walking in the orthopedic urgent care center as the patient sustained a hematoma from the fall.  When they were ready to leave, the patient found she could not breathe, she felt presyncopal.  Her daughter took her to [facility].  Per patient, she has had some shortness of breath on and off during the past week mild, but today it was much worse.  She had made an appointment with pulmonary on Tuesday as a result.  In the ER, chest x-ray shows mild central pulmonary vascular congestion and cardiomegaly, BNP 718, up from 479 on 07/25/2021.  Troponin is elevated at 125 => 127.  EKG ventricular paced rhythm.  Hemoglobin 7.5, down from 10.9 10/26/2021.  Patient reports one episode of a dark stool yesterday. Fecal occult stool negative. Patient was started on Protonix drip for the next 72 hours by EDP.  Hospitalist asked to admit.  Patient denies use of NSAIDs, she takes Tylenol as needed.  She endorses mild chest pain once during the week.  She endorses GERD which she states is milder.  She denies lower extremity edema.  Review of Systems:  The patient denies anorexia, fever, weight loss,, vision loss, decreased hearing, hoarseness, chest pain, syncope, dyspnea on exertion, peripheral edema, balance deficits, hemoptysis, abdominal pain, melena, hematochezia, severe indigestion/heartburn, hematuria, incontinence, genital  sores, muscle weakness, suspicious skin lesions, transient blindness, difficulty walking, depression, unusual weight change, abnormal bleeding, enlarged lymph nodes, angioedema, and breast masses. Positives: Shortness of breath, heartburn,  Past Medical History: Past Medical History:  Diagnosis Date   Achalasia    Acquired hypothyroidism 08/20/2020   Acute metabolic encephalopathy 07/25/2021   AICD (automatic cardioverter/defibrillator) present 11/14/2019   Allergic rhinitis    Aortic valve disorder 03/26/2002   Atherosclerosis of abdominal aorta 05/01/2020   Cardiomyopathy    CHB (complete heart block) 08/18/2017   CHF (congestive heart failure)    Cholelithiasis without obstruction 05/01/2020   Chronic gouty arthritis    Chronic heart failure with preserved ejection fraction 05/01/2020   Chronic kidney disease (CKD), active medical management without dialysis, stage 3 (moderate) 05/01/2020   Closed torus fracture of distal end of right radius with delayed healing 08/12/2021   Delusional thoughts    Diverticulosis of colon 05/01/2020   Epistaxis    Essential (primary) hypertension 08/18/2017   Gastroesophageal reflux disease 05/01/2020   Gastrointestinal hemorrhage    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 11/14/2019   Hypercoagulability due to atrial fibrillation 05/01/2020   Hyperlipidemia 11/14/2019   Hypocalcemia 12/14/2020   Hypoglycemia    Hypokalemia 12/14/2020   Hypomagnesemia 12/14/2020   Hypotension 12/14/2020   Idiopathic pulmonary fibrosis 05/01/2020   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   Iron deficiency anemia    Long term (current) use of anticoagulants    Malnutrition of mild degree [name]: 75% to less than 90% of standard weight)    Mild dementia 08/12/2021   Mitral and aortic incompetence 11/14/2019   Non-rheumatic atrial  fibrillation 05/01/2020   Non-toxic multinodular goiter 08/20/2020   NSVT (nonsustained ventricular tachycardia)  08/18/2017   Oropharyngeal dysphagia    Osteoarthritis of hip 05/01/2020   Osteoarthritis of knee 05/01/2020   Osteopenia of neck of left femur    Pain of left hip joint 12/12/2017   Proteinuria 05/01/2020   Raynaud's disease    Recurrent falls 04/09/2021   Rheumatoid arthritis    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   Septic shock 03/10/2020   Slow transit constipation    Systemic lupus erythematosus 05/01/2020   Thrombophilia    Transient ischemic attack    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   Vitamin D deficiency    Past Surgical History:  Procedure Laterality Date   BREAST BIOPSY Left    CARDIAC VALVE SURGERY     CARPAL TUNNEL RELEASE     COLONOSCOPY WITH PROPOFOL N/A 12/20/2020   Procedure: COLONOSCOPY WITH PROPOFOL;  Surgeon: Willis Modena, MD;  Location: [facility] ENDOSCOPY;  Service: Endoscopy;  Laterality: N/A;   HEMORRHOID SURGERY     PACEMAKER INSERTION     RIGHT/LEFT HEART CATH AND CORONARY ANGIOGRAPHY N/A 11/27/2020   Procedure: RIGHT/LEFT HEART CATH AND CORONARY ANGIOGRAPHY;  Surgeon: Dolores Patty, MD;  Location: [facility] INVASIVE CV LAB;  Service: Cardiovascular;  Laterality: N/A;   TONSILLECTOMY      Medications: Prior to Admission medications   Medication Sig Start Date End Date Taking? Authorizing Provider  acetaminophen (TYLENOL) 500 MG tablet Take 1,000 mg by mouth every 6 (six) hours as needed (pain).    [provider]  albuterol (VENTOLIN HFA) 108 (90 Base) MCG/ACT inhaler Inhale 2 puffs into the lungs every 6 (six) hours as needed for wheezing or shortness of breath.    [provider]  allopurinol (ZYLOPRIM) 100 MG tablet TAKE 1 TABLET BY MOUTH EVERY DAY 11/20/21   Rice, Jamesetta Orleans, MD  AMOXICILLIN PO Take 500 mg by mouth. Before dental procedures    [provider]  Budeson-Glycopyrrol-Formoterol (BREZTRI AEROSPHERE) 160-9-4.8 MCG/ACT AERO Inhale 2 puffs into the lungs 2 (two)  times daily as needed ("for flares").    [provider]  Cholecalciferol (VITAMIN D3) 25 MCG (1000 UT) CAPS Take 1,000 Units by mouth daily with lunch.    [provider]  famotidine (PEPCID) 20 MG tablet Take 20 mg by mouth at bedtime as needed for heartburn or indigestion. 07/03/21   [provider]  ferrous sulfate 325 (65 FE) MG tablet Take 325 mg by mouth See admin instructions. Take 325 mg by mouth with breakfast and supper    [provider]  furosemide (LASIX) 40 MG tablet Take 40 mg by mouth See admin instructions. Take 40 mg by mouth in the morning (scheduled) and an additional 40 mg at bedtime if swelling is present. HOLD IF SYSTOLIC READING IS <110 OR DIASTOLIC READING IS <60.    [provider]  hydroxychloroquine (PLAQUENIL) 200 MG tablet Take 1 tablet (200 mg total) by mouth daily. 04/10/21   Rice, Jamesetta Orleans, MD  levothyroxine (SYNTHROID) 25 MCG tablet Take 1 tablet (25 mcg total) by mouth daily. 11/06/21   Shamleffer, Konrad Dolores, MD  magnesium oxide (MAG-OX) 400 MG tablet Take 1 tablet (400 mg total) by mouth daily. 11/27/21   Sheilah Pigeon, PA-C  metoprolol succinate (TOPROL-XL) 25 MG 24 hr tablet Take 1 tablet (25 mg total) by mouth daily. Hold dose if <110 SBP or <60 DBP Patient taking differently: Take 25 mg by  mouth See admin instructions. Take 25 mg by mouth once a day and HOLD IF systolic reading is <110 OR diastolic reading is <60 04/23/21   Bensimhon, Bevelyn Buckles, MD  Multiple Vitamins-Minerals (MULTIVITAMIN WITH MINERALS) tablet Take 1 tablet by mouth daily with lunch.    [provider]  Nintedanib (OFEV) 150 MG CAPS Take 150 mg by mouth at bedtime.    [provider]  OLANZapine (ZYPREXA) 2.5 MG tablet TAKE 1 TABLET BY MOUTH EVERYDAY AT BEDTIME 12/23/21   Jaffe, Adam R, DO  potassium chloride SA (KLOR-CON M) 20 MEQ tablet Take 1 tablet (20 mEq total) by mouth daily. 11/27/21   Sheilah Pigeon, PA-C   predniSONE (DELTASONE) 5 MG tablet Take 1 tablet (5 mg total) by mouth daily with breakfast. Patient to double up dose during sick day rule 11/06/21   Shamleffer, Konrad Dolores, MD  Rivaroxaban (XARELTO) 15 MG TABS tablet Take 1 tablet (15 mg total) by mouth daily with supper. 04/23/21   Bensimhon, Bevelyn Buckles, MD    Allergies:   Allergies  Allergen Reactions   Propofol Other (See Comments)    "Caused asthma"   Flecainide Other (See Comments)    Reaction not recalled   Amiodarone Other (See Comments)    Delirium/Confusion/Psychosis   Amlodipine Other (See Comments)    "Tired and syncope"    Social History:  reports that she has never smoked. She has been exposed to tobacco smoke. She has never used smokeless tobacco. She reports that she does not drink alcohol and does not use drugs.  Family History: Family History  Problem Relation Age of Onset   Heart disease Mother    Hypertension Mother    Rheum arthritis Mother    Osteoarthritis Mother    Heart disease Father    Hypertension Father    Osteoarthritis Father    Heart disease Sister    Diabetes Brother    Cancer Brother     Physical Exam: Vitals:   12/26/21 1700 12/26/21 1704 12/26/21 1727 12/26/21 1836  BP:   135/75 (!) 141/66  Pulse: 69  70 70  Resp:   16 16  Temp:  98.2 F (36.8 C)  98.1 F (36.7 C)  TempSrc:  Oral  Oral  SpO2:   100% 100%  Weight:    57.2 kg  Height:        General:  Alert and oriented times three, well developed and nourished, no acute distress Eyes: PERRLA, pink conjunctiva, no scleral icterus ENT: Moist oral mucosa, neck supple, no thyromegaly Lungs: clear to ascultation, no wheeze, some coarseness, no use of accessory muscles Cardiovascular: regular rate and rhythm, no regurgitation, no gallops, positive pansystolic murmur. No carotid bruits, ++ JVD.  PPM/AICD left chest wall Abdomen: soft, positive BS, non-tender, non-distended, no organomegaly, not an acute abdomen GU: not  examined Neuro: CN II - XII grossly intact, sensation intact Musculoskeletal: strength 5/5 all extremities, no clubbing, cyanosis or edema Skin: no rash, no subcutaneous crepitation, no decubitus Psych: appropriate patient   Labs on Admission:  Recent Labs    12/26/21 1350  NA 139  K 3.8  CL 108  CO2 22  GLUCOSE 80  BUN 39*  CREATININE 1.22*  CALCIUM 8.8*    Recent Labs    12/26/21 1350 12/26/21 1857  WBC 7.0 7.8  NEUTROABS 5.1  --   HGB 7.9* 7.5*  HCT 24.3* 23.5*  MCV 101.7* 105.9*  PLT 179 184    Recent Labs  12/26/21 1350  DDIMER 0.35    Micro Results: Recent Results (from the past 240 hour(s))  Resp Panel by RT-PCR (Flu A&B, Covid) Anterior Nasal Swab     Status: None   Collection Time: 12/26/21  1:22 PM   Specimen: Anterior Nasal Swab  Result Value Ref Range Status   SARS Coronavirus 2 by RT PCR NEGATIVE NEGATIVE Final    Comment: (NOTE) SARS-CoV-2 target nucleic acids are NOT DETECTED.  The SARS-CoV-2 RNA is generally detectable in upper respiratory specimens during the acute phase of infection. The lowest concentration of SARS-CoV-2 viral copies this assay can detect is 138 copies/mL. A negative result does not preclude SARS-Cov-2 infection and should not be used as the sole basis for treatment or other patient management decisions. A negative result may occur with  improper specimen collection/handling, submission of specimen other than nasopharyngeal swab, presence of viral mutation(s) within the areas targeted by this assay, and inadequate number of viral copies(<138 copies/mL). A negative result must be combined with clinical observations, patient history, and epidemiological information. The expected result is Negative.  Fact Sheet for Patients:  BloggerCourse.com  Fact Sheet for Healthcare Providers:  SeriousBroker.it  This test is no t yet approved or cleared by the [place] FDA  and  has been authorized for detection and/or diagnosis of SARS-CoV-2 by FDA under an Emergency Use Authorization (EUA). This EUA will remain  in effect (meaning this test can be used) for the duration of the COVID-19 declaration under Section 564(b)(1) of the Act, 21 U.S.C.section 360bbb-3(b)(1), unless the authorization is terminated  or revoked sooner.       Influenza A by PCR NEGATIVE NEGATIVE Final   Influenza B by PCR NEGATIVE NEGATIVE Final    Comment: (NOTE) The Xpert Xpress SARS-CoV-2/FLU/RSV plus assay is intended as an aid in the diagnosis of influenza from Nasopharyngeal swab specimens and should not be used as a sole basis for treatment. Nasal washings and aspirates are unacceptable for Xpert Xpress SARS-CoV-2/FLU/RSV testing.  Fact Sheet for Patients: BloggerCourse.com  Fact Sheet for Healthcare Providers: SeriousBroker.it  This test is not yet approved or cleared by the [place] FDA and has been authorized for detection and/or diagnosis of SARS-CoV-2 by FDA under an Emergency Use Authorization (EUA). This EUA will remain in effect (meaning this test can be used) for the duration of the COVID-19 declaration under Section 564(b)(1) of the Act, 21 U.S.C. section 360bbb-3(b)(1), unless the authorization is terminated or revoked.  Performed at [facility], [address] [place], [place] [address]      Radiological Exams on Admission: DG Chest 2 View  Result Date: 12/26/2021 CLINICAL DATA:  Shortness of breath EXAM: CHEST - 2 VIEW COMPARISON:  July 30, 2021 FINDINGS: The heart size and mediastinal contours are stable. Cardiomegaly identified. Cardiac pacemaker with cardiac valvular replacement rings are unchanged. Mild central pulmonary vascular congestion noted. No focal pneumonia or pleural effusion noted. The visualized skeletal structures are stable. IMPRESSION: Mild central pulmonary vascular  congestion and cardiomegaly. Electronically Signed   By: Sherian Rein M.D.   On: 12/26/2021 13:16    Assessment/Plan Present on Admission:  Symptomatic anemia/chronic  Iron deficiency anemia -Brief overnight observation on med telemetry -Suspect anemia of chronic disease due to SLE, RA, CKD, poor nutritional state, occult stool negative. However patient with diverticular bleed Nov 2022, Transfused 3u RBCs. Colonoscopy 11/26 which noted hemorrhoids and diverticulosis, no active bleeding -Anemia panel ordered, megaloblastic anemia, MCV 105.9.  Checking vitamin  B12 and folate levels. Patient maintained on PO Iron for years. -Transfuse total of 2 units PRBC, posttransfusion H&H. Serial H/H -Lasix prior to transfusion -Xarelto held. Defer to AM team GI consult and continued anticoagulation -Patient with bioprosthetic AVR, mitral valve annuloplasty, tricuspid valve repair    Acute on chronic congestive heart failure HFpEF/ AICD/PPM -CHF order set initiated./Flash pulmonary edema -Likely precipitated by anemia.(Patient being transfused), goal hemoglobin~ 10 -IV Lasix, strict I's and O's, daily weights -Most recent echo 05/03/21  Elevated troponins -Secondary to CHF and an anemia.  Troponins flat, 125 => 127 -Transfusing 2 units packed red blood cells, goal hemoglobin close to 10   Hypercoagulability due to atrial fibrillation/ Non-rheumatic atrial fibrillation/AVR -Stable, metoprolol resumed as well as Xarelto.  No evidence of GI bleed, greater suspicion anemia of chronic disease -Will leave discontinuation of Protonix drip to a.m. team -Defer to a.m. team if GI consult needed -Serial H&H   Essential (primary) hypertension -Stable, resume metoprolol   Systemic lupus erythematosus/ Immunodeficiency/ Raynaud's disease  Rheumatoid arthritis -Resume Plaquenil, Ofev, and prednisone   Chronic kidney disease (CKD), active medical management without dialysis, stage 3 (moderate)  Secondary  hyperaldosteronism -Avoid nephrotoxic medication  COPD/ Idiopathic pulmonary fibrosis -Breztri and prednisone resumed, nebulizers as needed   Mild dementia.  Mild delusion -Resume Zyprexa nightly for sleep   Chronic gouty arthritis -Allopurinol resumed   Malnutrition of mild degree [name]: 75% to less than 90% of standard weight) -Ensure with meals   Secondary adrenal insufficiency -Continue prednisone   Transient ischemic attack   Naszir Cott 12/26/2021, 8:07 PM

## 2021-12-26 NOTE — ED Triage Notes (Addendum)
Pt arrives pov, to triage in wheelchair, c/o shob x 1 week, inhaler not providing relief. Pts family member endorses pt hoarseness, hx of asthma. Used inhaler at 0900 with little relief

## 2021-12-27 ENCOUNTER — Inpatient Hospital Stay (HOSPITAL_COMMUNITY): Payer: Medicare HMO

## 2021-12-27 DIAGNOSIS — S7001XA Contusion of right hip, initial encounter: Secondary | ICD-10-CM | POA: Diagnosis not present

## 2021-12-27 DIAGNOSIS — R195 Other fecal abnormalities: Secondary | ICD-10-CM | POA: Diagnosis not present

## 2021-12-27 DIAGNOSIS — D649 Anemia, unspecified: Secondary | ICD-10-CM | POA: Diagnosis not present

## 2021-12-27 LAB — BASIC METABOLIC PANEL
Anion gap: 7 (ref 5–15)
BUN: 35 mg/dL — ABNORMAL HIGH (ref 8–23)
CO2: 21 mmol/L — ABNORMAL LOW (ref 22–32)
Calcium: 8.6 mg/dL — ABNORMAL LOW (ref 8.9–10.3)
Chloride: 108 mmol/L (ref 98–111)
Creatinine, Ser: 1.14 mg/dL — ABNORMAL HIGH (ref 0.44–1.00)
GFR, Estimated: 51 mL/min — ABNORMAL LOW (ref >60)
Glucose, Bld: 135 mg/dL — ABNORMAL HIGH (ref 70–99)
Potassium: 3.8 mmol/L (ref 3.5–5.1)
Sodium: 136 mmol/L (ref 135–145)

## 2021-12-27 LAB — PREPARE RBC (CROSSMATCH)

## 2021-12-27 LAB — HEMOGLOBIN AND HEMATOCRIT, BLOOD
HCT: 21.9 % — ABNORMAL LOW (ref 36.0–46.0)
Hemoglobin: 7.2 g/dL — ABNORMAL LOW (ref 12.0–15.0)

## 2021-12-27 LAB — MAGNESIUM: Magnesium: 2.1 mg/dL (ref 1.7–2.4)

## 2021-12-27 MED ORDER — SODIUM CHLORIDE 0.9% IV SOLUTION: Freq: Once | INTRAVENOUS | Status: AC

## 2021-12-27 MED ORDER — FUROSEMIDE 10 MG/ML IJ SOLN: 20.0000 mg | Freq: Once | INTRAMUSCULAR | Status: DC

## 2021-12-27 MED ORDER — BUDESON-GLYCOPYRROL-FORMOTEROL 160-9-4.8 MCG/ACT IN AERO: 2.0000 | INHALATION_SPRAY | Freq: Two times a day (BID) | RESPIRATORY_TRACT | Status: DC | PRN

## 2021-12-27 MED ORDER — UMECLIDINIUM BROMIDE 62.5 MCG/ACT IN AEPB
1.0000 | INHALATION_SPRAY | Freq: Every day | RESPIRATORY_TRACT | Status: DC
  Administered 2021-12-27 – 2021-12-29 (×3): 1 via RESPIRATORY_TRACT
  Filled 2021-12-27: qty 7

## 2021-12-27 MED ORDER — MOMETASONE FURO-FORMOTEROL FUM 100-5 MCG/ACT IN AERO
2.0000 | INHALATION_SPRAY | Freq: Two times a day (BID) | RESPIRATORY_TRACT | Status: DC
  Administered 2021-12-27 – 2021-12-29 (×5): 2 via RESPIRATORY_TRACT
  Filled 2021-12-27: qty 8.8

## 2021-12-27 MED ORDER — SODIUM CHLORIDE 0.9% IV SOLUTION: Freq: Once | INTRAVENOUS | Status: DC

## 2021-12-27 MED ORDER — FUROSEMIDE 10 MG/ML IJ SOLN
20.0000 mg | Freq: Once | INTRAMUSCULAR | Status: AC
  Administered 2021-12-27: 20 mg via INTRAVENOUS
  Filled 2021-12-27: qty 2

## 2021-12-27 NOTE — Progress Notes (Signed)
Progress Note   Patient: Lindsey Huber OZD:[id number] DOB: 02-Jul-1945 DOA: 12/26/2021     1 DOS: the patient was seen and examined on 12/27/2021   Brief hospital course: 76 year old female with a complex past medical history.  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   Patient fell approximately 1 week ago, her daughter took her for walking in the orthopedic urgent care center as the patient sustained a hematoma from the fall.  When they were ready to leave, the patient found she could not breathe, she felt presyncopal.  Her daughter took her to [facility].  Per patient, she has had some shortness of breath on and off during the past week mild, but today it was much worse.  She had made an appointment with pulmonary on Tuesday as a result.   In the ER, chest x-ray shows mild central pulmonary vascular congestion and cardiomegaly, BNP 718, up from 479 on 07/25/2021.  Troponin is elevated at 125 => 127.  EKG ventricular paced rhythm.  Hemoglobin 7.5, down from 10.9 10/26/2021.  Patient reports one episode of a dark stool yesterday. Fecal occult stool negative. Patient was started on Protonix drip for the next 72 hours by EDP.  Hospitalist asked to admit.  Assessment and Plan: Present on Admission:  Acute blood loss anemia -Stools heme neg -R thigh with large hematoma s/p recent fall for which pt was seen at urgent care -Pt given 1 unit PRBC this AM with hgb still 7.2 post-transfusion, raising concern for continued bleed -Have ordered CT LE  -follow serial h/h and transfuse as needed -hold anticoagulation    Acute on chronic congestive heart failure HFpEF/  AICD/PPM -CHF order set initiated./Flash pulmonary edema -Likely precipitated by anemia.(Patient being transfused), goal hemoglobin~ 10 -Continue IV lasix BID as tolerated -Most recent echo 05/03/21,reviewed with severely dilated L and R atria -recheck bmet in AM   Elevated troponins -Secondary to CHF and an anemia.  Troponins flat, 125 => 127 -chest pain free at present    Hypercoagulability due to atrial fibrillation/ Non-rheumatic atrial fibrillation/AVR -Stable, metoprolol resumed  -hold anticoagulation given concerns of acute blood loss anemia    Essential (primary) hypertension -Stable, cont metoprolol    Systemic lupus erythematosus/ Immunodeficiency/ Raynaud's disease  Rheumatoid arthritis -Resume Plaquenil, Ofev, and prednisone    Chronic kidney disease (CKD), active medical management without dialysis, stage 3 (moderate)  Secondary hyperaldosteronism -Avoid nephrotoxic medication   COPD/ Idiopathic pulmonary fibrosis -Breztri and prednisone resumed, nebulizers as needed    Mild dementia.  Mild delusion -Resume Zyprexa nightly for sleep    Chronic gouty arthritis -Allopurinol resumed    Malnutrition of mild degree [name]: 75% to less than 90% of standard weight) -Ensure with meals    Secondary adrenal insufficiency -Continue prednisone    Transient ischemic attack      Subjective: Reports feeling better  Physical Exam: Vitals:   12/27/21 0856 12/27/21 1157 12/27/21 1617 12/27/21 1635  BP: 124/71 121/60 118/60 117/66  Pulse: 70 70 70 70  Resp: 20 19 20 19   Temp: 98.3 F (36.8 C) 98 F (36.7 C) 98.1 F (36.7 C) 98.1 F (36.7 C)  TempSrc: Oral Oral Oral Oral  SpO2: 100% 100% 100% 100%  Weight:  Height:       General exam: Awake, laying in bed, in nad Respiratory system: Normal respiratory effort, no wheezing Cardiovascular system: regular rate, s1, s2 Gastrointestinal system: Soft, nondistended, positive BS Central nervous system: CN2-12  grossly intact, strength intact Extremities: Perfused, no clubbing, large palpable hematoma over R hip Skin: Normal skin turgor, no notable skin lesions seen Psychiatry: Mood normal // no visual hallucinations   Data Reviewed:  Labs reviewed: Na 136, K 3.8, Cr 1.14  Family Communication: Pt in room, family at bedside  Disposition: Status is: Inpatient Remains inpatient appropriate because: severity of illness  Planned Discharge Destination: Home    Author: Rickey Barbara, MD 12/27/2021 4:50 PM  For on call review [email or web address].

## 2021-12-27 NOTE — Progress Notes (Signed)
  Transition of Care [facility]) Screening Note   Patient Details  Name: Lindsey Huber Date of Birth: [birth year]   [facility] of [facility]) CM/SW Contact:    Lanier Clam, RN Phone Number: 12/27/2021, 3:06 PM    Transition of Care Department [facility]) has reviewed patient and no TOC needs have been identified at this time. We will continue to monitor patient advancement through interdisciplinary progression rounds. If new patient transition needs arise, please place a TOC consult.

## 2021-12-27 NOTE — Hospital Course (Signed)
76 year old female with a complex past medical history.  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   Patient fell approximately 1 week ago, her daughter took her for walking in the orthopedic urgent care center as the patient sustained a hematoma from the fall.  When they were ready to leave, the patient found she could not breathe, she felt presyncopal.  Her daughter took her to [facility].  Per patient, she has had some shortness of breath on and off during the past week mild, but today it was much worse.  She had made an appointment with pulmonary on Tuesday as a result.   In the ER, chest x-ray shows mild central pulmonary vascular congestion and cardiomegaly, BNP 718, up from 479 on 07/25/2021.  Troponin is elevated at 125 => 127.  EKG ventricular paced rhythm.  Hemoglobin 7.5, down from 10.9 10/26/2021.  Patient reports one episode of a dark stool yesterday. Fecal occult stool negative. Patient was started on Protonix drip for the next 72 hours by EDP.  Hospitalist asked to admit.

## 2021-12-28 DIAGNOSIS — S7001XA Contusion of right hip, initial encounter: Secondary | ICD-10-CM | POA: Diagnosis not present

## 2021-12-28 DIAGNOSIS — R195 Other fecal abnormalities: Secondary | ICD-10-CM | POA: Diagnosis not present

## 2021-12-28 DIAGNOSIS — D649 Anemia, unspecified: Secondary | ICD-10-CM | POA: Diagnosis not present

## 2021-12-28 LAB — COMPREHENSIVE METABOLIC PANEL
ALT: 15 U/L (ref 0–44)
AST: 28 U/L (ref 15–41)
Albumin: 3.1 g/dL — ABNORMAL LOW (ref 3.5–5.0)
Alkaline Phosphatase: 47 U/L (ref 38–126)
Anion gap: 8 (ref 5–15)
BUN: 33 mg/dL — ABNORMAL HIGH (ref 8–23)
CO2: 24 mmol/L (ref 22–32)
Calcium: 8.1 mg/dL — ABNORMAL LOW (ref 8.9–10.3)
Chloride: 105 mmol/L (ref 98–111)
Creatinine, Ser: 1.46 mg/dL — ABNORMAL HIGH (ref 0.44–1.00)
GFR, Estimated: 38 mL/min — ABNORMAL LOW (ref >60)
Glucose, Bld: 127 mg/dL — ABNORMAL HIGH (ref 70–99)
Potassium: 3.9 mmol/L (ref 3.5–5.1)
Sodium: 137 mmol/L (ref 135–145)
Total Bilirubin: 2.3 mg/dL — ABNORMAL HIGH (ref 0.3–1.2)
Total Protein: 7.4 g/dL (ref 6.5–8.1)

## 2021-12-28 LAB — TYPE AND SCREEN
ABO/RH(D): A NEG
Antibody Screen: POSITIVE
DAT, IgG: NEGATIVE
Unit division: 0
Unit division: 0

## 2021-12-28 LAB — BPAM RBC
Blood Product Expiration Date: 202312252359
Blood Product Expiration Date: 202312302359
ISSUE DATE / TIME: 202312030534
ISSUE DATE / TIME: 202312031609
Unit Type and Rh: 600
Unit Type and Rh: 600

## 2021-12-28 LAB — CBC
HCT: 29.6 % — ABNORMAL LOW (ref 36.0–46.0)
Hemoglobin: 9.8 g/dL — ABNORMAL LOW (ref 12.0–15.0)
MCH: 32.8 pg (ref 26.0–34.0)
MCHC: 33.1 g/dL (ref 30.0–36.0)
MCV: 99 fL (ref 80.0–100.0)
Platelets: 173 10*3/uL (ref 150–400)
RBC: 2.99 MIL/uL — ABNORMAL LOW (ref 3.87–5.11)
RDW: 15.5 % (ref 11.5–15.5)
WBC: 5.5 10*3/uL (ref 4.0–10.5)
nRBC: 0.9 % — ABNORMAL HIGH (ref 0.0–0.2)

## 2021-12-28 LAB — GLUCOSE, CAPILLARY: Glucose-Capillary: 118 mg/dL — ABNORMAL HIGH (ref 70–99)

## 2021-12-28 LAB — HEMOGLOBIN AND HEMATOCRIT, BLOOD
HCT: 32.9 % — ABNORMAL LOW (ref 36.0–46.0)
HCT: 30 % — ABNORMAL LOW (ref 36.0–46.0)
Hemoglobin: 10.7 g/dL — ABNORMAL LOW (ref 12.0–15.0)
Hemoglobin: 9.9 g/dL — ABNORMAL LOW (ref 12.0–15.0)

## 2021-12-28 MED ORDER — LEVOTHYROXINE SODIUM 25 MCG PO TABS
25.0000 ug | ORAL_TABLET | Freq: Every day | ORAL | Status: DC
  Administered 2021-12-29: 25 ug via ORAL
  Filled 2021-12-28: qty 1

## 2021-12-28 MED ORDER — FUROSEMIDE 40 MG PO TABS
40.0000 mg | ORAL_TABLET | Freq: Every day | ORAL | Status: DC
  Administered 2021-12-28 – 2021-12-29 (×2): 40 mg via ORAL
  Filled 2021-12-28 (×2): qty 1

## 2021-12-28 NOTE — Progress Notes (Signed)
Mobility Specialist - Progress Note  Pre-mobility: 70 bpm HR, 98% SpO2 During mobility: 75 bpm HR, 93% SpO2 Post-mobility: 73 bpm HR, 97% SPO2   12/28/21 1100  Mobility  Activity Ambulated with assistance in hallway  Level of Assistance Minimal assist, patient does 75% or more  Assistive Device [employer] Ambulated (ft) 200 ft  Range of Motion/Exercises Active  Activity Response Tolerated well  Mobility Referral Yes  $Mobility charge 1 Mobility   Pt was found in bed and agreeable to ambulate. C/o fatigue during ambulation and had ~ 2 min seated rest break. At EOS returned to recliner chair with necessities in reach and chair alarm on.  Billey Chang Mobility Specialist

## 2021-12-28 NOTE — Progress Notes (Signed)
Mobility Specialist - Progress Note   12/28/21 1600  Mobility  Activity Ambulated with assistance in hallway  Level of Assistance Minimal assist, patient does 75% or more  Assistive Device Cane  Distance Ambulated (ft) 180 ft  Range of Motion/Exercises Active  Activity Response Tolerated well  Mobility Referral Yes  $Mobility charge 1 Mobility   Pt was found in bed and agreeable to ambulate. Grew fatigued through session and had a knee buckle that she was abl to self correct. Towards EOS stated that she got a little more SOB this time. At EOS returned to bed with necessities in reach.  Billey Chang Mobility Specialist

## 2021-12-28 NOTE — Progress Notes (Signed)
Progress Note   Patient: Lindsey Huber [id number] DOB: 08-22-1945 DOA: 12/26/2021     2 DOS: the patient was seen and examined on 12/28/2021   Brief hospital course: 76 year old female with a complex past medical history.  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   Patient fell approximately 1 week ago, her daughter took her for walking in the orthopedic urgent care center as the patient sustained a hematoma from the fall.  When they were ready to leave, the patient found she could not breathe, she felt presyncopal.  Her daughter took her to [facility].  Per patient, she has had some shortness of breath on and off during the past week mild, but today it was much worse.  She had made an appointment with pulmonary on Tuesday as a result.   In the ER, chest x-ray shows mild central pulmonary vascular congestion and cardiomegaly, BNP 718, up from 479 on 07/25/2021.  Troponin is elevated at 125 => 127.  EKG ventricular paced rhythm.  Hemoglobin 7.5, down from 10.9 10/26/2021.  Patient reports one episode of a dark stool yesterday. Fecal occult stool negative. Patient was started on Protonix drip for the next 72 hours by EDP.  Hospitalist asked to admit.  Assessment and Plan: Present on Admission:  Acute blood loss anemia -Stools heme neg -R thigh with large hematoma s/p recent fall for which pt was seen at urgent care -Pt given 2 units PRBC's this visit with post-transfusion hgb of 10.7 -Ordered and reviewed CT LE, findings of subq hematoma posterolateral to the R greater trochanter with surrounding subq edema without evidence of foreign body or soft tissue  emphysema -held anticoag on presentation    Acute on chronic congestive heart failure HFpEF/ AICD/PPM -CHF order set initiated./Flash pulmonary edema -Likely precipitated by anemia.(Patient being transfused), goal hemoglobin~ 10 -Most recent echo 05/03/21,reviewed with severely dilated L and R atria -was continued on BID IV lasix. Cr up to 1.46. Will transition to PO lasix   Elevated troponins -Secondary to CHF and an anemia.  Troponins flat, 125 => 127 -chest pain free at present    Hypercoagulability due to atrial fibrillation/ Non-rheumatic atrial fibrillation/AVR -Stable, metoprolol resumed  -Anticoagulation held at presentation given concerns of acute blood loss anemia -Will plan to discuss with vascular regarding timing for which it would be safe to resume    Essential (primary) hypertension -Stable, cont metoprolol    Systemic lupus erythematosus/ Immunodeficiency/ Raynaud's disease  Rheumatoid arthritis -Resume Plaquenil, Ofev, and prednisone    Chronic kidney disease (CKD), active medical management without dialysis, stage 3 (moderate)  Secondary hyperaldosteronism -Avoid nephrotoxic medication   COPD/ Idiopathic pulmonary fibrosis -Breztri and prednisone resumed, nebulizers as needed    Mild dementia.  Mild delusion -Resume Zyprexa nightly for sleep    Chronic gouty arthritis -Allopurinol resumed    Malnutrition of mild degree [name]: 75% to less than 90% of standard weight) -Ensure with meals    Secondary adrenal insufficiency -Continue prednisone    Transient ischemic attack      Subjective: States feeling better. Eager to go home soon  Physical Exam: Vitals:   12/27/21 2154 12/28/21 0603 12/28/21 0823 12/28/21 1204  BP: 126/62 127/72  122/61  Pulse:  70  70  Resp:  20  17  Temp:  98.1 F (36.7 C)  98.2 F (36.8 C)  TempSrc:  Oral  Oral  SpO2:   100% 100%  Weight:      Height:       General exam: Conversant, in no acute distress Respiratory  system: normal chest rise, clear, no audible wheezing Cardiovascular system: regular rhythm, s1-s2 Gastrointestinal system: Nondistended, nontender, pos BS Central nervous system: No seizures, no tremors Extremities: No cyanosis, no joint deformities Skin: No rashes, no pallor Psychiatry: Affect normal // no auditory hallucinations   Data Reviewed:  Labs reviewed: Na 137, K 3.9, Cr 1.46  Family Communication: Pt in room, family not at bedside  Disposition: Status is: Inpatient Remains inpatient appropriate because: severity of illness  Planned Discharge Destination: Home    Author: Rickey Barbara, MD 12/28/2021 3:43 PM  For on call review [email or web address].

## 2021-12-29 ENCOUNTER — Inpatient Hospital Stay: Payer: Self-pay

## 2021-12-29 ENCOUNTER — Ambulatory Visit: Payer: Medicare HMO | Admitting: Pulmonary Disease

## 2021-12-29 ENCOUNTER — Telehealth: Payer: Self-pay

## 2021-12-29 DIAGNOSIS — R0602 Shortness of breath: Secondary | ICD-10-CM

## 2021-12-29 DIAGNOSIS — D649 Anemia, unspecified: Secondary | ICD-10-CM | POA: Diagnosis not present

## 2021-12-29 DIAGNOSIS — S7001XA Contusion of right hip, initial encounter: Secondary | ICD-10-CM | POA: Diagnosis not present

## 2021-12-29 LAB — COMPREHENSIVE METABOLIC PANEL
ALT: 16 U/L (ref 0–44)
AST: 28 U/L (ref 15–41)
Albumin: 2.7 g/dL — ABNORMAL LOW (ref 3.5–5.0)
Alkaline Phosphatase: 48 U/L (ref 38–126)
Anion gap: 8 (ref 5–15)
BUN: 31 mg/dL — ABNORMAL HIGH (ref 8–23)
CO2: 23 mmol/L (ref 22–32)
Calcium: 8.3 mg/dL — ABNORMAL LOW (ref 8.9–10.3)
Chloride: 105 mmol/L (ref 98–111)
Creatinine, Ser: 1.04 mg/dL — ABNORMAL HIGH (ref 0.44–1.00)
GFR, Estimated: 56 mL/min — ABNORMAL LOW (ref >60)
Glucose, Bld: 86 mg/dL (ref 70–99)
Potassium: 3.6 mmol/L (ref 3.5–5.1)
Sodium: 136 mmol/L (ref 135–145)
Total Bilirubin: 1.6 mg/dL — ABNORMAL HIGH (ref 0.3–1.2)
Total Protein: 7.1 g/dL (ref 6.5–8.1)

## 2021-12-29 LAB — CBC
HCT: 29.8 % — ABNORMAL LOW (ref 36.0–46.0)
Hemoglobin: 9.7 g/dL — ABNORMAL LOW (ref 12.0–15.0)
MCH: 33.1 pg (ref 26.0–34.0)
MCHC: 32.6 g/dL (ref 30.0–36.0)
MCV: 101.7 fL — ABNORMAL HIGH (ref 80.0–100.0)
Platelets: 175 10*3/uL (ref 150–400)
RBC: 2.93 MIL/uL — ABNORMAL LOW (ref 3.87–5.11)
RDW: 15.4 % (ref 11.5–15.5)
WBC: 5.9 10*3/uL (ref 4.0–10.5)
nRBC: 0 % (ref 0.0–0.2)

## 2021-12-29 LAB — GLUCOSE, CAPILLARY
Glucose-Capillary: 87 mg/dL (ref 70–99)
Glucose-Capillary: 111 mg/dL — ABNORMAL HIGH (ref 70–99)
Glucose-Capillary: 116 mg/dL — ABNORMAL HIGH (ref 70–99)

## 2021-12-29 NOTE — Progress Notes (Signed)
Patient to be discharged to home this evening. All discharge instructions reviewed with the Patient including all discharge Medications and schedules for these Medications. Patient verbalized understanding of all discharge instructions. Discharge AVS with the Patient at time of discharge

## 2021-12-29 NOTE — Discharge Instructions (Signed)
Resume xarelto on 01/04/22

## 2021-12-29 NOTE — Discharge Summary (Signed)
Physician Discharge Summary   Patient: Lindsey Huber MRN: [id number] DOB: May 05, 1945  Admit date:     12/26/2021  Discharge date: 12/29/21  Discharge Physician: Rickey Barbara   PCP: Collene Mares, PA   Recommendations at discharge:    Follow up with PCP in 1-2 weeks Follow up with Cardiology as scheduled  Discharge Diagnoses: Principal Problem:   Symptomatic anemia Active Problems:   Essential (primary) hypertension   Idiopathic pulmonary fibrosis   Cardiomyopathy   Hypercoagulability due to atrial fibrillation   Systemic lupus erythematosus   Chronic heart failure with preserved ejection fraction   Chronic kidney disease (CKD), active medical management without dialysis, stage 3 (moderate)   Secondary hyperaldosteronism   Immunodeficiency   Non-rheumatic atrial fibrillation   Secondary adrenal insufficiency   Non-toxic multinodular goiter   Acquired hypothyroidism   Hypotension   Recurrent falls   AICD (automatic cardioverter/defibrillator) present   CHB (complete heart block)   Chronic gouty arthritis   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 *  Hospital Course: 76 year old female with a complex past medical history.  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   Patient  fell approximately 1 week ago, her daughter took her for walking in the orthopedic urgent care center as the patient sustained a hematoma from the fall.  When they were ready to leave, the patient found she could not breathe, she felt presyncopal.  Her daughter took her to [facility].  Per patient, she has had some shortness of breath on and off during the past week mild, but today it was much worse.  She had made an appointment with pulmonary on Tuesday as a result.   In the ER, chest x-ray shows mild central pulmonary vascular congestion and cardiomegaly, BNP 718, up from 479 on 07/25/2021.  Troponin is elevated at 125 => 127.  EKG ventricular paced rhythm.  Hemoglobin 7.5, down from 10.9 10/26/2021.  Patient reports one episode of a dark stool yesterday. Fecal occult stool negative. Patient was started on Protonix drip for the next 72 hours by EDP.  Hospitalist asked to admit.  Assessment and Plan: Present on Admission:  Acute blood loss anemia -Stools heme neg -R thigh with large hematoma s/p recent fall for which pt was seen at urgent care -Pt given 2 units PRBC's this visit with post-transfusion hgb of 10.7 -Ordered and reviewed CT LE, findings of subq hematoma posterolateral to the R greater trochanter with surrounding subq edema without evidence of foreign body or soft tissue emphysema -held anticoag on presentation -Hgb has remained stable. Discussed with Cardiology who recommends holding xarelto x 5 days post discharge. Cardiology to follow up closely as outpatient    Acute on chronic congestive heart failure HFpEF/ AICD/PPM -CHF order set initiated./Flash pulmonary edema -Likely precipitated by anemia.(Patient being transfused), goal hemoglobin~ 10 -Most recent echo 05/03/21,reviewed with severely dilated  L and R atria -was continued on BID IV lasix. Cr peaked to 1.46. Transitioned back to PO lasix -breathing comfortably on RA. Eager to go home   Elevated troponins -Secondary to CHF and  an anemia.  Troponins flat, 125 => 127 -chest pain free at present    Hypercoagulability due to atrial fibrillation/ Non-rheumatic atrial fibrillation/AVR -CHADS-VASc of 6 -Stable, metoprolol resumed  -Anticoagulation held at presentation given concerns of acute blood loss anemia -Discussed with Cardiology. Recommendation to cont to hold xarelto x 5 days on d/c. Cardiology to follow up with patient after d/c    Essential (primary) hypertension -Stable, cont metoprolol    Systemic lupus erythematosus/ Immunodeficiency/ Raynaud's disease  Rheumatoid arthritis -Resume Plaquenil, Ofev, and prednisone    Chronic kidney disease (CKD), active medical management without dialysis, stage 3 (moderate)  Secondary hyperaldosteronism -Avoid nephrotoxic medication   COPD/ Idiopathic pulmonary fibrosis -Breztri and prednisone resumed, nebulizers as needed    Mild dementia.  Mild delusion -Resume Zyprexa nightly for sleep    Chronic gouty arthritis -Allopurinol resumed    Malnutrition of mild degree [name]: 75% to less than 90% of standard weight) -Ensure with meals    Secondary adrenal insufficiency -Continue prednisone    Transient ischemic attack        Consultants: Discussed case with Cardiology over phone Procedures performed:   Disposition: Home Diet recommendation:  Cardiac diet DISCHARGE MEDICATION: Allergies as of 12/29/2021       Reactions   Other Other (See Comments)   Patient is to NOT EAT any foods with husks or tree nuts, popcorn   Propofol Shortness Of Breath, Other (See Comments)   "Caused asthma"   Flecainide Other (See Comments)   Reaction not recalled   Amiodarone Other (See Comments)   Delirium/Confusion/Psychosis   Amlodipine Other (See Comments)   "Tired and syncope"        Medication List     STOP taking these medications    amoxicillin 500 MG capsule Commonly known as: AMOXIL       TAKE these medications    acetaminophen 500 MG  tablet Commonly known as: TYLENOL Take 1,000 mg by mouth every 6 (six) hours as needed (pain).   albuterol 108 (90 Base) MCG/ACT inhaler Commonly known as: VENTOLIN HFA Inhale 2 puffs into the lungs every 6 (six) hours as needed for wheezing or shortness of breath.   allopurinol 100 MG tablet Commonly known as: ZYLOPRIM TAKE 1 TABLET BY MOUTH EVERY DAY   Breztri Aerosphere 160-9-4.8 MCG/ACT Aero Generic drug: Budeson-Glycopyrrol-Formoterol Inhale 2 puffs into the lungs 2 (two) times daily as needed ("for flares").   DRY EYE RELIEF DROPS OP Place 1 drop into both eyes in the morning.   famotidine 20 MG tablet Commonly known as: PEPCID Take 20 mg by mouth daily as needed for heartburn or indigestion.   ferrous sulfate 325 (65 FE) MG tablet Take 325 mg by mouth See admin instructions. Take 325 mg by mouth with breakfast and supper   furosemide 40 MG tablet Commonly known as: LASIX Take 40 mg by mouth See admin instructions. Take 40 mg by mouth in the morning (scheduled) and an additional 40 mg at bedtime if swelling is present. HOLD IF SYSTOLIC READING IS 123456 OR DIASTOLIC READING IS 123XX123.   hydroxychloroquine 200 MG tablet Commonly known as: PLAQUENIL Take 1 tablet (200 mg total) by mouth daily.   levothyroxine 25 MCG tablet Commonly known as: SYNTHROID Take 1 tablet (25 mcg total) by mouth  daily. What changed: when to take this   magnesium oxide 400 MG tablet Commonly known as: MAG-OX Take 1 tablet (400 mg total) by mouth daily.   metoprolol succinate 25 MG 24 hr tablet Commonly known as: TOPROL-XL Take 1 tablet (25 mg total) by mouth daily. Hold dose if <110 SBP or <60 DBP What changed:  when to take this additional instructions   Ofev 150 MG Caps Generic drug: Nintedanib Take 150 mg by mouth at bedtime.   OLANZapine 2.5 MG tablet Commonly known as: ZYPREXA TAKE 1 TABLET BY MOUTH EVERYDAY AT BEDTIME What changed: See the new instructions.   potassium chloride  SA 20 MEQ tablet Commonly known as: KLOR-CON M Take 1 tablet (20 mEq total) by mouth daily.   predniSONE 5 MG tablet Commonly known as: DELTASONE Take 1 tablet (5 mg total) by mouth daily with breakfast. Patient to double up dose during sick day rule   Rivaroxaban 15 MG Tabs tablet Commonly known as: XARELTO Take 1 tablet (15 mg total) by mouth daily with supper.   Vitamin D3 25 MCG (1000 UT) Caps Take 1,000 Units by mouth daily with lunch.        Follow-up Information     [place] [place] Follow up in 1 week(s).   Specialty: Internal Medicine Why: Hospital follow up Contact information: [address] [place] [address] [phone]         follow up with Cardiology as will be scheduled Follow up.   Why: Hospital follow up               Discharge Exam: Filed Weights   12/26/21 1836 12/27/21 0535 12/29/21 0524  Weight: 57.2 kg 58 kg 59 kg   General exam: Awake, laying in bed, in nad Respiratory system: Normal respiratory effort, no wheezing Cardiovascular system: regular rate, s1, s2 Gastrointestinal system: Soft, nondistended, positive BS Central nervous system: CN2-12 grossly intact, strength intact Extremities: Perfused, no clubbing Skin: Normal skin turgor, no notable skin lesions seen Psychiatry: Mood normal // no visual hallucinations   Condition at discharge: fair  The results of significant diagnostics from this hospitalization (including imaging, microbiology, ancillary and laboratory) are listed below for reference.   Imaging Studies: DG Outside Films Body  Result Date: 12/29/2021 This examination belongs to an outside facility and is stored here for comparison purposes only.  Contact the originating outside institution for any associated report or interpretation.  DG Outside Films Extremity  Result Date: 12/29/2021 This examination belongs to an outside facility and is stored here for comparison purposes only.   Contact the originating outside institution for any associated report or interpretation.  CT EXTREMITY LOWER RIGHT WO CONTRAST  Result Date: 12/28/2021 CLINICAL DATA:  Upper leg trauma. EXAM: CT OF THE LOWER RIGHT EXTREMITY WITHOUT CONTRAST TECHNIQUE: Multidetector CT imaging of the right thigh was performed according to the standard protocol. RADIATION DOSE REDUCTION: This exam was performed according to the departmental dose-optimization program which includes automated exposure control, adjustment of the mA and/or kV according to patient size and/or use of iterative reconstruction technique. COMPARISON:  Right hip radiographs 06/17/2021. Pelvic CTA 12/13/2020. FINDINGS: Bones/Joint/Cartilage No evidence of acute fracture or dislocation. There are mild degenerative changes at the right hip with subchondral sclerosis in the femoral head suspicious for mild chronic osteonecrosis. No subchondral collapse or significant hip joint effusion. There are moderate degenerative changes at the right knee with meniscal chondrocalcinosis, a small joint effusion and probable small intra-articular  loose bodies. No evidence of bone destruction. Ligaments Suboptimally assessed by CT. Muscles and Tendons No intramuscular fluid collection, foreign body or soft tissue emphysema identified. The extensor mechanism is intact at the knee. There is chronic heterotopic ossification adjacent to the right iliac crest, unchanged from previous CT. Soft tissues Hyperdense subcutaneous collection posterolateral to the right greater trochanter measures 6.6 x 3.9 x 7.6 cm, most likely a subcutaneous hematoma in the setting of recent trauma. There is surrounding subcutaneous edema without evidence of foreign body or soft tissue emphysema. Lateral subcutaneous edema extends into the distal thigh. There is also mild subcutaneous edema within the mid right thigh. No other focal fluid collections are identified. Diffuse vascular calcifications are  noted within the right thigh. Uterine fibroids and mild sigmoid colonic diverticulosis noted. IMPRESSION: 1. Probable subcutaneous hematoma posterolateral to the right greater trochanter with surrounding subcutaneous edema. No evidence of foreign body or soft tissue emphysema. 2. No evidence of acute fracture or dislocation. 3. Moderate degenerative changes at the right knee with meniscal chondrocalcinosis, a small joint effusion and probable small intra-articular loose bodies. 4. Mild degenerative changes at the right hip with subchondral sclerosis in the femoral head suspicious for mild chronic osteonecrosis. No subchondral collapse or significant hip joint effusion. Electronically Signed   By: Carey BullocksWilliam  Veazey M.D.   On: 12/28/2021 08:36   DG Chest 2 View  Result Date: 12/26/2021 CLINICAL DATA:  Shortness of breath EXAM: CHEST - 2 VIEW COMPARISON:  July 30, 2021 FINDINGS: The heart size and mediastinal contours are stable. Cardiomegaly identified. Cardiac pacemaker with cardiac valvular replacement rings are unchanged. Mild central pulmonary vascular congestion noted. No focal pneumonia or pleural effusion noted. The visualized skeletal structures are stable. IMPRESSION: Mild central pulmonary vascular congestion and cardiomegaly. Electronically Signed   By: Sherian ReinWei-Chen  Lin M.D.   On: 12/26/2021 13:16   US THYROID  Result Date: 12/11/2021 CLINICAL DATA:  Follow up on [facility]. S/P FNA of the left nodule in 2023 EXAM: THYROID ULTRASOUND TECHNIQUE: Ultrasound examination of the thyroid gland and adjacent soft tissues was performed. COMPARISON:  05/21/2018 08/28/2020 FINDINGS: Parenchymal Echotexture: Moderately heterogenous Isthmus: 0.3 cm Right lobe: 4.6 x 2.0 x 1.8 cm Left lobe: 6.4 x 2.6 x 3.4 cm _________________________________________________________ Estimated total number of nodules >/= 1 cm: 2 Number of spongiform nodules >/=  2 cm not described below (TR1): 0 Number of mixed cystic and solid nodules >/=  1.5 cm not described below (TR2): 0 _________________________________________________________ Nodule 1: 2.2 x 1.4 x 1.3 cm heterogeneous region in the mid right thyroid lobe is unchanged since prior examination. This again is favored to be a pseudo nodule given lack of define margins. _________________________________________________________ Nodule 2: 0.8 x 0.7 x 0.4 cm hypoechoic solid nodule in the medial mid right thyroid lobe does not meet criteria for imaging surveillance or FNA. _________________________________________________________ Nodule 3: 1.7 x 1.6 x 1.5 cm solid hypoechoic nodule in the mid left thyroid lobe is unchanged in size since prior examination. Please correlate with prior FNA results from 05/20/2021. IMPRESSION: Previously biopsied left mid thyroid nodule is unchanged in size since prior examination. Please correlate with prior FNA results from 05/20/2021. The above is in keeping with the ACR TI-RADS recommendations - J Am Coll Radiol 2017;14:587-595. Electronically Signed   By: Acquanetta BellingFarhaan  Mir M.D.   On: 12/11/2021 08:16    Microbiology: Results for orders placed or performed during the hospital encounter of 12/26/21  Resp Panel by RT-PCR (Flu A&B, Covid) Anterior Nasal Swab  Status: None   Collection Time: 12/26/21  1:22 PM   Specimen: Anterior Nasal Swab  Result Value Ref Range Status   SARS Coronavirus 2 by RT PCR NEGATIVE NEGATIVE Final    Comment: (NOTE) SARS-CoV-2 target nucleic acids are NOT DETECTED.  The SARS-CoV-2 RNA is generally detectable in upper respiratory specimens during the acute phase of infection. The lowest concentration of SARS-CoV-2 viral copies this assay can detect is 138 copies/mL. A negative result does not preclude SARS-Cov-2 infection and should not be used as the sole basis for treatment or other patient management decisions. A negative result may occur with  improper specimen collection/handling, submission of specimen other than  nasopharyngeal swab, presence of viral mutation(s) within the areas targeted by this assay, and inadequate number of viral copies(<138 copies/mL). A negative result must be combined with clinical observations, patient history, and epidemiological information. The expected result is Negative.  Fact Sheet for Patients:  EntrepreneurPulse.com.au  Fact Sheet for Healthcare Providers:  IncredibleEmployment.be  This test is no t yet approved or cleared by the [place] FDA and  has been authorized for detection and/or diagnosis of SARS-CoV-2 by FDA under an Emergency Use Authorization (EUA). This EUA will remain  in effect (meaning this test can be used) for the duration of the COVID-19 declaration under Section 564(b)(1) of the Act, 21 U.S.C.section 360bbb-3(b)(1), unless the authorization is terminated  or revoked sooner.       Influenza A by PCR NEGATIVE NEGATIVE Final   Influenza B by PCR NEGATIVE NEGATIVE Final    Comment: (NOTE) The Xpert Xpress SARS-CoV-2/FLU/RSV plus assay is intended as an aid in the diagnosis of influenza from Nasopharyngeal swab specimens and should not be used as a sole basis for treatment. Nasal washings and aspirates are unacceptable for Xpert Xpress SARS-CoV-2/FLU/RSV testing.  Fact Sheet for Patients: EntrepreneurPulse.com.au  Fact Sheet for Healthcare Providers: IncredibleEmployment.be  This test is not yet approved or cleared by the [place] FDA and has been authorized for detection and/or diagnosis of SARS-CoV-2 by FDA under an Emergency Use Authorization (EUA). This EUA will remain in effect (meaning this test can be used) for the duration of the COVID-19 declaration under Section 564(b)(1) of the Act, 21 U.S.C. section 360bbb-3(b)(1), unless the authorization is terminated or revoked.  Performed at [facility], [address] [place], [place]  [address]     Labs: CBC: Recent Labs  Lab 12/26/21 1350 12/26/21 1857 12/27/21 0109 12/28/21 0052 12/28/21 0850 12/28/21 1637 12/29/21 0459  WBC 7.0 7.8  --  5.5  --   --  5.9  NEUTROABS 5.1  --   --   --   --   --   --   HGB 7.9* 7.5* 7.2* 9.8* 10.7* 9.9* 9.7*  HCT 24.3* 23.5* 21.9* 29.6* 32.9* 30.0* 29.8*  MCV 101.7* 105.9*  --  99.0  --   --  101.7*  PLT 179 184  --  173  --   --  0000000   Basic Metabolic Panel: Recent Labs  Lab 12/26/21 1350 12/27/21 0109 12/28/21 0052 12/29/21 0459  NA 139 136 137 136  K 3.8 3.8 3.9 3.6  CL 108 108 105 105  CO2 22 21* 24 23  GLUCOSE 80 135* 127* 86  BUN 39* 35* 33* 31*  CREATININE 1.22* 1.14* 1.46* 1.04*  CALCIUM 8.8* 8.6* 8.1* 8.3*  MG  --  2.1  --   --    Liver Function Tests: Recent Labs  Lab 12/28/21 0052 12/29/21 0459  AST 28 28  ALT 15 16  ALKPHOS 47 48  BILITOT 2.3* 1.6*  PROT 7.4 7.1  ALBUMIN 3.1* 2.7*   CBG: Recent Labs  Lab 12/28/21 1631 12/29/21 0726 12/29/21 1124 12/29/21 1640  GLUCAP 118* 87 111* 116*    Discharge time spent: less than 30 minutes.  Signed: Marylu Lund, MD [facility] 12/29/2021

## 2021-12-29 NOTE — Telephone Encounter (Signed)
Dr. Rhona Leavens at [facility] called with an anticoagulation question.   Reviewed pt's chart, returned call for clarification, two identifiers used. Pt seen in ED for bleeding and treated. Dr.Chiu wanted to know when the pt could restart her Xarelto that she was taking for A-fib. Pt seen by Dr. Karin Lieu in October 2022. Informed him that pt was prescribed Xarelto by cardiology and that question would have to be deferred to them. Confirmed understanding and will call/page cardiology to inquire when to restart.

## 2021-12-29 NOTE — Progress Notes (Addendum)
Mobility Specialist - Progress Note   12/29/21 1057  Mobility  Activity Ambulated with assistance in hallway  Level of Assistance Standby assist, set-up cues, supervision of patient - no hands on  Assistive Device Cane  Distance Ambulated (ft) 260 ft  Activity Response Tolerated well  Mobility Referral Yes  $Mobility charge 1 Mobility   Pt received in recliner and agreeable to mobility. Pt did mention having some L knee buckling. Ambulation cut short due to pt feeling a little tired. No other complaints during mobility. Pt to recliner after session with all needs met.    [facility]

## 2021-12-29 NOTE — Evaluation (Signed)
Occupational Therapy Evaluation Patient Details Name: Lindsey Huber MRN: [id number] DOB: 07/26/1945 Today's Date: 12/29/2021   History of Present Illness Patient is a 76 year old female who presented with shortness of breath. Patient was found to have acute blood loss anemia, acute on chronic congestive heart failure, elevated troponin. Patient received 2 units of PRBCs. CT of LE revealed hematoma posterolateral of right greater trochanter with surrounding edema.  PMH: raynaud's disease, systemic lupus erythematosus, COPD, idiopathic fibrosis, dementia,   Clinical Impression   Patient is a 76 year old female who was admitted for above. Patient reported having family support 24/7 at home with independence in ADL tasks. Patient was min guard for ADLs with RW on this date and notably quick to fatigue.  Patient was noted to have decreased functional activity tolerance, decreased endurance, decreased standing balance, decreased safety awareness, and decreased knowledge of AD/AE impacting participation in ADLs. Patient would continue to benefit from skilled OT services at this time while admitted to address noted deficits in order to improve overall safety and independence in ADLs.        Recommendations for follow up therapy are one component of a multi-disciplinary discharge planning process, led by the attending physician.  Recommendations may be updated based on patient status, additional functional criteria and insurance authorization.   Follow Up Recommendations  No OT follow up     Assistance Recommended at Discharge Frequent or constant Supervision/Assistance  Patient can return home with the following Assistance with cooking/housework;Direct supervision/assist for medications management;Assist for transportation;Help with stairs or ramp for entrance;Direct supervision/assist for financial management;A little help with bathing/dressing/bathroom    Functional Status Assessment  Patient  has had a recent decline in their functional status and demonstrates the ability to make significant improvements in function in a reasonable and predictable amount of time.  Equipment Recommendations  None recommended by OT (patient reported having all recommended items at home already)    Recommendations for Other Services       Precautions / Restrictions Restrictions Weight Bearing Restrictions: No      Mobility Bed Mobility Overal bed mobility: Needs Assistance Bed Mobility: Supine to Sit     Supine to sit: Supervision     General bed mobility comments: with increased time         Balance Overall balance assessment: Mild deficits observed, not formally tested       ADL either performed or assessed with clinical judgement   ADL Overall ADL's : Needs assistance/impaired Eating/Feeding: Modified independent;Sitting Eating/Feeding Details (indicate cue type and reason): in recliner Grooming: Wash/dry face;Wash/dry hands;Oral care;Standing;Min guard Grooming Details (indicate cue type and reason): at sink with no LOB noted. Upper Body Bathing: Set up;Sitting   Lower Body Bathing: Set up;Sitting/lateral leans   Upper Body Dressing : Set up;Sitting   Lower Body Dressing: Set up;Min guard Lower Body Dressing Details (indicate cue type and reason): figure four to don/doff socks with min guard for standing. need RW for balance Toilet Transfer: Min guard;Ambulation;Rolling walker (2 wheels) Toilet Transfer Details (indicate cue type and reason): to transfer from edge of bed to recliner in room with increased time and cues to keep toes on the floor. Toileting- Architect and Hygiene: Min guard;Sit to/from stand               Vision Patient Visual Report: No change from baseline       Perception     Praxis      Pertinent Vitals/Pain Pain Assessment Pain  Assessment: No/denies pain     Hand Dominance     Extremity/Trunk Assessment Upper  Extremity Assessment Upper Extremity Assessment: Overall WFL for tasks assessed   Lower Extremity Assessment Lower Extremity Assessment: Defer to PT evaluation   Cervical / Trunk Assessment Cervical / Trunk Assessment: Normal   Communication Communication Communication: No difficulties   Cognition Arousal/Alertness: Awake/alert Behavior During Therapy: WFL for tasks assessed/performed Overall Cognitive Status: Within Functional Limits for tasks assessed         General Comments: plesant and cooperative     General Comments  noted to have redness and edema on anterior proximal forearm with redness. patient reported IV infultrated previous day    Exercises     Shoulder Instructions      Home Living Family/patient expects to be discharged to:: Private residence Living Arrangements: Children Available Help at Discharge: Family;Available 24 hours/day Type of Home: House Home Access: Level entry     Home Layout: One level     Bathroom Shower/Tub: Tub/shower unit         Home Equipment: Pharmacist, hospital (2 wheels);Cane - quad;Wheelchair - manual          Prior Functioning/Environment Prior Level of Function : Independent/Modified Independent               ADLs Comments: Patient reported having walker at home.        OT Problem List: Decreased strength;Decreased coordination;Decreased activity tolerance;Impaired balance (sitting and/or standing);Decreased safety awareness;Decreased knowledge of precautions      OT Treatment/Interventions: Self-care/ADL training;Energy conservation;Therapeutic exercise;DME and/or AE instruction;Neuromuscular education;Therapeutic activities;Patient/family education;Balance training    OT Goals(Current goals can be found in the care plan section) Acute Rehab OT Goals Patient Stated Goal: to go home OT Goal Formulation: With patient Time For Goal Achievement: 01/12/22 Potential to Achieve Goals: Fair  OT  Frequency: Min 2X/week    Co-evaluation              AM-PAC OT "6 Clicks" Daily Activity     Outcome Measure Help from another person eating meals?: None Help from another person taking care of personal grooming?: A Little Help from another person toileting, which includes using toliet, bedpan, or urinal?: A Little Help from another person bathing (including washing, rinsing, drying)?: A Little Help from another person to put on and taking off regular upper body clothing?: A Little Help from another person to put on and taking off regular lower body clothing?: A Little 6 Click Score: 19   End of Session Equipment Utilized During Treatment: Gait belt;Rolling walker (2 wheels)  Activity Tolerance: Patient tolerated treatment well Patient left: in chair;with call bell/phone within reach  OT Visit Diagnosis: Unsteadiness on feet (R26.81);Other abnormalities of gait and mobility (R26.89);Muscle weakness (generalized) (M62.81)                Time: 4825-0037 OT Time Calculation (min): 16 min Charges:  OT General Charges $OT Visit: 1 Visit OT Evaluation $OT Eval Low Complexity: 1 Low  Sylvanus Telford OTR/L, MS Acute Rehabilitation Department Office# [phone]   Selinda Flavin 12/29/2021, 9:41 AM

## 2021-12-29 NOTE — Evaluation (Signed)
Physical Therapy Evaluation-1x Patient Details Name: Lindsey Huber MRN: [id number] DOB: 05-Dec-1945 Today's Date: 12/29/2021  History of Present Illness  Patient is a 76 year old female who presented with shortness of breath. Patient was found to have acute blood loss anemia, acute on chronic congestive heart failure, elevated troponin. Patient received 2 units of PRBCs. CT of LE revealed hematoma posterolateral of right greater trochanter with surrounding edema.  PMH: raynaud's disease, systemic lupus erythematosus, COPD, idiopathic fibrosis, dementia,  Clinical Impression  On eval, pt was Mod Ind with mobility. She walked ~100 feet with a RW. Pt tolerated activity well. Do not anticipate any f/u PT needs. 1x eval. Will sign off.        Recommendations for follow up therapy are one component of a multi-disciplinary discharge planning process, led by the attending physician.  Recommendations may be updated based on patient status, additional functional criteria and insurance authorization.  Follow Up Recommendations No PT follow up      Assistance Recommended at Discharge PRN  Patient can return home with the following       Equipment Recommendations None recommended by PT  Recommendations for Other Services       Functional Status Assessment Patient has had a recent decline in their functional status and demonstrates the ability to make significant improvements in function in a reasonable and predictable amount of time.     Precautions / Restrictions Precautions Precautions: Fall Restrictions Weight Bearing Restrictions: No      Mobility  Bed Mobility Overal bed mobility: Modified Independent                  Transfers Overall transfer level: Modified independent                      Ambulation/Gait Ambulation/Gait assistance: Modified independent (Device/Increase time) Gait Distance (Feet): 100 Feet Assistive device: Rolling walker (2 wheels) Gait  Pattern/deviations: Step-through pattern          Stairs            Wheelchair Mobility    Modified Rankin (Stroke Patients Only)       Balance Overall balance assessment: Mild deficits observed, not formally tested                                           Pertinent Vitals/Pain Pain Assessment Pain Assessment: No/denies pain    Home Living Family/patient expects to be discharged to:: Private residence Living Arrangements: Children Available Help at Discharge: Family;Available 24 hours/day Type of Home: House Home Access: Level entry       Home Layout: One level Home Equipment: Advice worker (2 wheels);Cane - quad;Wheelchair - manual      Prior Function Prior Level of Function : Independent/Modified Independent               ADLs Comments: Patient reported having walker at home.     Hand Dominance        Extremity/Trunk Assessment   Upper Extremity Assessment Upper Extremity Assessment: Defer to OT evaluation    Lower Extremity Assessment Lower Extremity Assessment: Generalized weakness (chronic L knee pain)    Cervical / Trunk Assessment Cervical / Trunk Assessment: Normal  Communication   Communication: No difficulties  Cognition Arousal/Alertness: Awake/alert Behavior During Therapy: WFL for tasks assessed/performed Overall Cognitive Status: Within Functional Limits for tasks assessed  General Comments: plesant and cooperative        General Comments      Exercises     Assessment/Plan    PT Assessment Patient does not need any further PT services  PT Problem List         PT Treatment Interventions      PT Goals (Current goals can be found in the Care Plan section)  Acute Rehab PT Goals Patient Stated Goal: home soon PT Goal Formulation: All assessment and education complete, DC therapy    Frequency       Co-evaluation                AM-PAC PT "6 Clicks" Mobility  Outcome Measure Help needed turning from your back to your side while in a flat bed without using bedrails?: None Help needed moving from lying on your back to sitting on the side of a flat bed without using bedrails?: None Help needed moving to and from a bed to a chair (including a wheelchair)?: None Help needed standing up from a chair using your arms (e.g., wheelchair or bedside chair)?: None Help needed to walk in hospital room?: None Help needed climbing 3-5 steps with a railing? : A Little 6 Click Score: 23    End of Session   Activity Tolerance: Patient tolerated treatment well Patient left: in bed;with call bell/phone within reach        Time: 0092-3300 PT Time Calculation (min) (ACUTE ONLY): 10 min   Charges:   PT Evaluation $PT Eval Low Complexity: 1 Low            Faye Ramsay, PT Acute Rehabilitation  Office: [phone]

## 2022-01-03 NOTE — Progress Notes (Unsigned)
Cardiology Office Note Date:  11/25/2021  Patient ID:  Lindsey Huber, Lindsey Huber 1945-02-01, MRN [id number] PCP:  Collene Mares, PA  Cardiologist:  Dr. [name] Electrophysiologist: Dr. Lalla Brothers    Chief Complaint:  post hospital  History of Present Illness: Lindsey Huber is a 76 y.o. female with history of IPF, HTN, GERD, AFib/flutter (permanent), VT, torsades (in setting of flecainide) > ICD, ? Hx of TIA hx,  VHD w/ bioprosthetic AVR, MV ring, TV replacement (2012), CKD (III), SLE, anemia of chronic disease, hypothyroidism/MNG, chronic CHF (diastolic), ? Adrenal insufficiency  1. On September 25, 2010 Lindsey patient was brought to Lindsey operating room with Dr. [name] and underwent aortic valve replacement, bioprosthetic, excision of Lindsey subaortic membrane, mitral valve annuloplasty, tricuspid valve repair followed by tricuspid valve replacement, and manual extraction of right ventricular lead. At that time she was brought to Lindsey intensive care unit with an open chest secondary to continued bleeding. 2. On September 3rd Lindsey patient was brought back to Lindsey operating room with Dr. [name] for reexploration of Lindsey mediastinum with mediastinal washout and sternal closure. 3. A third procedure was performed on September 20 by Dr. [name] which included an ICD lead insertion and generator replacement with a Medtronic ICD.  She saw Dr. Lalla Brothers April 2022, to establish management of Lindsey device, moving from [place]. She reported some nose bleeds and changed to Xarelto.  Nov 2022, admitted with GIB, CT scan showed acute GI hemorrhage involving mid descending colon, likely acute diverticular bleed. Transfused 3u RBCs. Colonoscopy 11/26 which noted hemorrhoids and diverticulosis, no active bleeding, recommended to restart Xarelto in 3 days if stable  She had an ER visit Feb 2023 with some transient confusion, word finding difficulty.  CT brain without acute findings, labs  unremarkable.  She had fall a couple days prior striking Lindsey arm, felt to have a cellulitis They did get hx of perhaps TIA in June of last year Neurology consulted, recommended MRI though, unable to get MRI done on Lindsey weekend given Lindsey device and pt/family preferred not to wait on that given resolved symptoms Discharged on Keflex and advised to f/u with neurology  She has been established with Lindsey HF team, saw Dr. [name] last 04/23/21, doing OK, waxing/waning DOE/SOB, exertional capacity., weight stable Planned to update Lindsey echo to re-evaluate Lindsey VHD, discussed  that "she has significant prosthetic valve TR with RV failure. Pulmonary pressures only mildly elevated, Discussed need for closer volume management with sliding scale lasix. Can switch to torsemide as needed, Will repeat echo at next visit. If RV failure and TR worsens may need to consider percutaneous valve options"   I saw Lindsey 04/29/21 She comes with Lindsey grand Huber. Since Lindsey visit with Dr. [name], no new symptoms or concerns She is currently getting an ambulatory EEG via neurology team. No syncope, no device therapies Occassionally she feel like Lindsey device sits towards he axilla, though otherwise no concerns. She reports Lindsey arm healed up well No bleeding, signs of bleeding No VT, volume stable No changes were made, planned for an annual visit, to c/w HF team  No further cardiac appt since then.  05/29/21: ER visit after a fall, tripped, device interrogated, normal findings reported, imaging was iok,  07/25/21: AMS, poor sleep, increasing AMS, "known dementia", observed paranoid behavior, perhaps hallucinations, admitted for further management Discharged 07/27/21 to f/u with PMD 07/30/21: ER visit, refusing meds, AMS, dementia  Following with neurosych vascular vs Lewy body dementia, ?also mentions  poss complex partial seizure, w/u ongoing, physchology since then regularly and has seen endo for MNG/adrenal  insufficiency  DEVICE remote with NSVTs,longest 15 seconds and f/u arranged  I saw Lindsey 11/26/21 She is accompanied by Lindsey grand Huber that she lives with, both she and Lindsey Huber (Lindsey Huber) help with organizing Lindsey medicines. She reports that she is much better about taking Lindsey meds, will at times forget, but not regularly anymore. Lindsey patient reports she feels well, not perfect, but well. Denies any CP, palpitations or cardiac awareness. No SOB, not overly active, gets a little winded with longer walking No dizzy spells, near syncope or syncope. She reports Lindsey home weight as stable, wobbles with Lindsey 3 same pounds up/down. Had no further VTs Discussed intermittent medication noncompliance and importance of Lindsey meds. OptiVol upward trending but exam/weights stable Advised HF team f/u  Saw A. Clegg, NP on 12/24/21, reported baseline DOE, stable weights, and good medication compliance.  Volume looked stable, additional PRN lasix dose discussed, planned for echo  Admitted via an UCC 12/26/21 after a fall ( a week prior), while at [facility] became acutely SOB and transferred to to [facility] Found acutely anemic with Hgb 7.5 and a large R thigh hematoma after Lindsey fall, transfused 2U to a Hgb of 10.7 Flash p.edema felt provoked by acute anemia, treated with IV lasix Planned to hold Xarelto 5 days and follow up with cardiology Discharged 12/29/21.  TODAY  She is accompanied by Lindsey Huber today Lindsey hip hematoma they both report as much smaller/improved, not resolved No longer painful. No rest SOB but gets winded with increased activity, this is at Lindsey baseline again, much better then when she went into Lindsey hospital  No CP, palpitations or cardiac awareness Good medication compliance No syncope.  Lindsey fall was mechanical, knee gave out.  She saw Lindsey PMD Friday had labs done H/H 10.6/32 Plts 196 BUN/Creat  36/1.16 K+ 3.3   Device information Current device is a MDT single chamber  ICD implanted 10/15/2010, gen change 09/02/2017 There is mention of ?damage to one of Lindsey device leads during Lindsey valvular surgery Secondary prevention indication with reports of torsades/VT In review of Lindsey CXRs, appears she has an abandoned RA lead and a broken/abandoned dual coil RV lead. Original device implanted 06/23/2006, indication was for "Sick sinus syndrome, atrial standstill, advanced arteriovenous block (AV Wenckebach rate 80 beats per minute), VT/VF arrest (torsades), mixed connective tissue disease"  Past Medical History:  Diagnosis Date   Achalasia    Acquired hypothyroidism 08/20/2020   Acute metabolic encephalopathy 07/25/2021   AICD (automatic cardioverter/defibrillator) present 11/14/2019   Allergic rhinitis    Aortic valve disorder 03/26/2002   Atherosclerosis of abdominal aorta 05/01/2020   Cardiomyopathy    CHB (complete heart block) 08/18/2017   CHF (congestive heart failure)    Cholelithiasis without obstruction 05/01/2020   Chronic gouty arthritis    Chronic heart failure with preserved ejection fraction 05/01/2020   Chronic kidney disease (CKD), active medical management without dialysis, stage 3 (moderate) 05/01/2020   Closed torus fracture of distal end of right radius with delayed healing 08/12/2021   Delusional thoughts    Diverticulosis of colon 05/01/2020   Epistaxis    Essential (primary) hypertension 08/18/2017   Gastroesophageal reflux disease 05/01/2020   Gastrointestinal hemorrhage    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 11/14/2019   Hypercoagulability due to atrial fibrillation 05/01/2020   Hyperlipidemia 11/14/2019   Hypocalcemia 12/14/2020  Hypoglycemia    Hypokalemia 12/14/2020   Hypomagnesemia 12/14/2020   Hypotension 12/14/2020   Idiopathic pulmonary fibrosis 05/01/2020   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   Iron deficiency anemia    Long term (current) use of anticoagulants    Malnutrition of mild degree  [name]: 75% to less than 90% of standard weight)    Mild dementia 08/12/2021   Mitral and aortic incompetence 11/14/2019   Non-rheumatic atrial fibrillation 05/01/2020   Non-toxic multinodular goiter 08/20/2020   NSVT (nonsustained ventricular tachycardia) 08/18/2017   Oropharyngeal dysphagia    Osteoarthritis of hip 05/01/2020   Osteoarthritis of knee 05/01/2020   Osteopenia of neck of left femur    Pain of left hip joint 12/12/2017   Proteinuria 05/01/2020   Raynaud's disease    Recurrent falls 04/09/2021   Rheumatoid arthritis    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   Septic shock 03/10/2020   Slow transit constipation    Systemic lupus erythematosus 05/01/2020   Thrombophilia    Transient ischemic attack    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   Vitamin D deficiency     Past Surgical History:  Procedure Laterality Date   BREAST BIOPSY Left    CARDIAC VALVE SURGERY     CARPAL TUNNEL RELEASE     COLONOSCOPY WITH PROPOFOL N/A 12/20/2020   Procedure: COLONOSCOPY WITH PROPOFOL;  Surgeon: Willis Modena, MD;  Location: [facility] ENDOSCOPY;  Service: Endoscopy;  Laterality: N/A;   HEMORRHOID SURGERY     PACEMAKER INSERTION     RIGHT/LEFT HEART CATH AND CORONARY ANGIOGRAPHY N/A 11/27/2020   Procedure: RIGHT/LEFT HEART CATH AND CORONARY ANGIOGRAPHY;  Surgeon: Dolores Patty, MD;  Location: [facility] INVASIVE CV LAB;  Service: Cardiovascular;  Laterality: N/A;   TONSILLECTOMY      Current Outpatient Medications  Medication Sig Dispense Refill   acetaminophen (TYLENOL) 500 MG tablet Take 1,000 mg by mouth every 6 (six) hours as needed (pain).     albuterol (VENTOLIN HFA) 108 (90 Base) MCG/ACT inhaler Inhale 2 puffs into Lindsey lungs every 6 (six) hours as needed for wheezing or shortness of breath.     allopurinol (ZYLOPRIM) 100 MG tablet TAKE 1 TABLET BY MOUTH EVERY DAY 90 tablet 1   Budeson-Glycopyrrol-Formoterol (BREZTRI AEROSPHERE) 160-9-4.8  MCG/ACT AERO Inhale 2 puffs into Lindsey lungs 2 (two) times daily as needed ("for flares").     Cholecalciferol (VITAMIN D3) 25 MCG (1000 UT) CAPS Take 1,000 Units by mouth daily with lunch.     famotidine (PEPCID) 20 MG tablet Take 20 mg by mouth at bedtime as needed for heartburn or indigestion.     ferrous sulfate 325 (65 FE) MG tablet Take 325 mg by mouth See admin instructions. Take 325 mg by mouth with breakfast and supper     furosemide (LASIX) 40 MG tablet Take 40 mg by mouth See admin instructions. Take 40 mg by mouth in Lindsey morning (scheduled) and an additional 40 mg at bedtime if swelling is present. HOLD IF SYSTOLIC READING IS <110 OR DIASTOLIC READING IS <60.     hydroxychloroquine (PLAQUENIL) 200 MG tablet Take 1 tablet (200 mg total) by mouth daily. 90 tablet 1   levothyroxine (SYNTHROID) 25 MCG tablet Take 1 tablet (25 mcg total) by mouth daily. 90 tablet 3   metoprolol succinate (TOPROL-XL) 25 MG 24 hr tablet Take 1 tablet (25 mg total) by mouth daily. Hold dose if <110 SBP or <60 DBP (Patient taking differently: Take 25 mg by mouth See admin instructions. Take  25 mg by mouth once a day and HOLD IF systolic reading is <110 OR diastolic reading is <60) 90 tablet 3   Multiple Vitamins-Minerals (MULTIVITAMIN WITH MINERALS) tablet Take 1 tablet by mouth daily with lunch.     Nintedanib (OFEV) 150 MG CAPS Take 150 mg by mouth at bedtime.     OLANZapine (ZYPREXA) 2.5 MG tablet TAKE 1 TABLET BY MOUTH EVERYDAY AT BEDTIME 90 tablet 0   predniSONE (DELTASONE) 5 MG tablet Take 1 tablet (5 mg total) by mouth daily with breakfast. Patient to double up dose during sick day rule 100 tablet 3   Rivaroxaban (XARELTO) 15 MG TABS tablet Take 1 tablet (15 mg total) by mouth daily with supper. 90 tablet 3   No current facility-administered medications for this visit.    Allergies:   Propofol, Flecainide, Amiodarone, and Amlodipine   Social History:  Lindsey patient  reports that she has never smoked. She has  been exposed to tobacco smoke. She has never used smokeless tobacco. She reports that she does not drink alcohol and does not use drugs.   Family History:  Lindsey patient's family history includes Cancer in Lindsey brother; Diabetes in Lindsey brother; Heart disease in Lindsey father, mother, and sister; Hypertension in Lindsey father and mother; Osteoarthritis in Lindsey father and mother; Rheum arthritis in Lindsey mother.  ROS:  Please see Lindsey history of present illness.    All other systems are reviewed and otherwise negative.   PHYSICAL EXAM:  VS:  There were no vitals taken for this visit. BMI: There is no height or weight on file to calculate BMI. Well nourished, well developed, in no acute distress HEENT: normocephalic, atraumatic Neck: no JVD, carotid bruits or masses Cardiac:   RRR; (paced), 2-3/6 SM, no rubs, or gallops Lungs:  CTA b/l, no wheezing, rhonchi or rales Abd: soft, nontender MS: no deformity, advanced  atrophy Ext: trace edema RLE chronically by pt report Skin: warm and dry, no rash Neuro:  No gross deficits appreciated Psych: euthymic mood, full affect  ICD site is stable, no tethering or discomfort, pocket looks good   EKG:  not done today  Device interrogation done today and reviewed by myself:  Battery and lead measurements are good No VT 99.7 VP%  05/13/21: TTE 1. Left ventricular ejection fraction, by estimation, is 50 to 55%. Lindsey  left ventricle has low normal function. Lindsey left ventricle has no regional  wall motion abnormalities. There is severe concentric left ventricular  hypertrophy. Left ventricular  diastolic function could not be evaluated.   2. Right ventricular systolic function is moderately reduced. Lindsey right  ventricular size is normal. There is mildly elevated pulmonary artery  systolic pressure.   3. Left atrial size was severely dilated.   4. Right atrial size was severely dilated.   5. Lindsey mitral valve has been repaired/replaced. Mild mitral valve   regurgitation. Mild mitral stenosis. Lindsey mean mitral valve gradient is 5.0  mmHg. There is a prosthetic annuloplasty ring present in Lindsey mitral  position.   6. Lindsey tricuspid valve is has been repaired/replaced. Lindsey tricuspid valve  is status post repair with an annuloplasty ring. Tricuspid valve  regurgitation is moderate. Mild tricuspid stenosis.   7. Lindsey aortic valve has been repaired/replaced. Aortic valve  regurgitation is not visualized. Moderate aortic valve stenosis. There is  a 19 mm bovine valve present in Lindsey aortic position. Echo findings are  consistent with normal structure and function  of Lindsey aortic valve  prosthesis.   8. Lindsey inferior vena cava is dilated in size with >50% respiratory  variability, suggesting right atrial pressure of 8 mmHg.   Comparison(s): No significant change from prior study. Increase in Aortic  valve mean gradient, from 18.6 mmHg to 35 mmHg.    R/LHC 11/27/20 Ao =129/63 (89) LV = 141/8 RA = 11 (v waves to 14) RV = 43/4 PA = 42/15 (25) PCW = 10 Fick cardiac output/index = 5.7/3.7 PVR = 2.1 WU SVR = 1101 PAPi = 2.45 WU Ao sat = 96% PA sat =  74%, 75% SVC sat = 82% MVA (LV-wedge tracing) = 3.5cm2 mean gradient across MV 3.6mmHG  Assessment:  1. Normal coronary arteries 2. EF 60-65% 3. Very mild PAH 4. Normal left-sided filling pressures with no significant v-waves in PCWP tracing to suggest hemodynamically significant MR 5. No significant stenosis across AoV, MV or TV   10/31/2020: TTE IMPRESSIONS   1. Left ventricular ejection fraction, by estimation, is 50 to 55%. Lindsey  left ventricle has low normal function. Lindsey left ventricle has no regional  wall motion abnormalities. There is severe concentric left ventricular  hypertrophy. Left ventricular  diastolic parameters are indeterminate. There is Abnormal septal motion.   2. Right ventricular systolic function is moderately reduced. Lindsey right  ventricular size is moderately enlarged.  There is severely elevated  pulmonary artery systolic pressure.   3. Left atrial size was severely dilated.   4. Right atrial size was severely dilated.   5. Lindsey mitral valve has been repaired. Lindsey annular ring appears to be  well seated. Mild to moderate mitral valve regurgitation. No evidence of  mitral stenosis. Procedure Date: 2012.   6. Lindsey tricuspid valve is has been repaired, there is a 20 mm bovine  pericardial valve. Tricuspid valve regurgitation is moderate.   7. Lindsey aortic valve has been replaced. Lindsey bioprosthetic valve is  well-seated. There is a 19 mm bovine pericardial valve present in Lindsey  aortic position. Procedure Date: 2012. No aortic stenosis or regurgitation  present.   8. Mild dilatation of Lindsey ascending aorta, measuring 39 mm. Abdominal  aorta is dilated measuring 2.83 cm.   9. Lindsey inferior vena cava is normal in size with greater than 50%  respiratory variability, suggesting right atrial pressure of 3 mmHg.   Comparison(s): Most recent study 03/11/2020 report reviewed in care  everywhere.   Recent Labs: 03/14/2021: Magnesium 1.8 07/25/2021: B Natriuretic Peptide 479.0; TSH 2.075 10/26/2021: ALT 15; BUN 27; Creat 0.88; Hemoglobin 10.9; Platelets 119; Potassium 3.6; Sodium 142  No results found for requested labs within last 365 days.   CrCl cannot be calculated (Patient's most recent lab result is older than Lindsey maximum 21 days allowed.).   Wt Readings from Last 3 Encounters:  11/06/21 124 lb (56.2 kg)  10/26/21 124 lb (56.2 kg)  09/08/21 125 lb (56.7 kg)     Other studies reviewed: Additional studies/records reviewed today include: summarized above  ASSESSMENT AND PLAN:  ICD VT Intact function No programming changes made No VT   Permanent AFib CHA2DS2Vasc is 6, on xarelto,  appropriately dosed No R waves at 40 today  She has had bleeding and falls, though with permanent AF and Lindsey multiple valve replacement/repairs, needs a/c Lindsey PMD has cleared  from hip perspective to resume xarelto today   Chronic CHF (diastolic) VHD aortic valve replacement, bioprosthetic, excision of Lindsey subaortic membrane, mitral valve annuloplasty, tricuspid valve repair followed by tricuspid valve replacement P.HTN IPF  Had been following closely with HF and pulmonary teams OptiVol post diuresis remains well below threshold  Exam does not suggest volume OL   K+ was 3.3 on Friday, she had not taken Lindsey meds yet that morning will repeat today She does not like he potassium pill but is taking it. Can consider using tabs or perhaps elixir  8.  HTN A little high today No changes for now   Disposition: we can see Lindsey from EP perspective in 11mo, sooner if needed,  She sees HF team in a couple weeks.  I have advised Lindsey to monitor closely Lindsey hip for any escalation in pain, swelling, and stop Lindsey xarelto and let Lindsey PMD know immediately for guidance.   Current medicines are reviewed at length with Lindsey patient today.  Lindsey patient did not have any concerns regarding medicines.  Norma Fredrickson, PA-C 11/25/2021 7:18 AM     [facility] [address] [place] [address] [phone] (office)  [phone] (fax)

## 2022-01-04 ENCOUNTER — Ambulatory Visit: Payer: Medicare HMO | Attending: Physician Assistant | Admitting: Physician Assistant

## 2022-01-04 ENCOUNTER — Encounter: Payer: Self-pay | Admitting: Physician Assistant

## 2022-01-04 VITALS — BP 154/86 | HR 74 | Ht 63.0 in | Wt 129.0 lb

## 2022-01-04 DIAGNOSIS — Z79899 Other long term (current) drug therapy: Secondary | ICD-10-CM | POA: Diagnosis not present

## 2022-01-04 DIAGNOSIS — I472 Ventricular tachycardia, unspecified: Secondary | ICD-10-CM | POA: Diagnosis not present

## 2022-01-04 DIAGNOSIS — I4821 Permanent atrial fibrillation: Secondary | ICD-10-CM

## 2022-01-04 DIAGNOSIS — Z952 Presence of prosthetic heart valve: Secondary | ICD-10-CM

## 2022-01-04 DIAGNOSIS — Z95810 Presence of automatic (implantable) cardiac defibrillator: Secondary | ICD-10-CM

## 2022-01-04 DIAGNOSIS — I5032 Chronic diastolic (congestive) heart failure: Secondary | ICD-10-CM

## 2022-01-04 DIAGNOSIS — Z9889 Other specified postprocedural states: Secondary | ICD-10-CM

## 2022-01-04 DIAGNOSIS — Z954 Presence of other heart-valve replacement: Secondary | ICD-10-CM

## 2022-01-04 MED ORDER — RIVAROXABAN 15 MG PO TABS: 15.0000 mg | ORAL_TABLET | Freq: Every day | ORAL | 1 refills | Status: DC

## 2022-01-04 MED ORDER — FUROSEMIDE 40 MG PO TABS: 40.0000 mg | ORAL_TABLET | ORAL | 1 refills | Status: DC

## 2022-01-04 NOTE — Patient Instructions (Signed)
Medication Instructions:   Your physician recommends that you continue on your current medications as directed. Please refer to the Current Medication list given to you today.   *If you need a refill on your cardiac medications before your next appointment, please call your pharmacy*   Lab Work: POTASSIUM LEVEL TODAY   If you have labs (blood work) drawn today and your tests are completely normal, you will receive your results only by: MyChart Message (if you have MyChart) OR A paper copy in the mail If you have any lab test that is abnormal or we need to change your treatment, we will call you to review the results.   Testing/Procedures: NONE ORDERED  TODAY    Follow-Up: At [facility], you and your health needs are our priority.  As part of our continuing mission to provide you with exceptional heart care, we have created designated Provider Care Teams.  These Care Teams include your primary Cardiologist (physician) and Advanced Practice Providers (APPs -  Physician Assistants and Nurse Practitioners) who all work together to provide you with the care you need, when you need it.  We recommend signing up for the patient portal called "MyChart".  Sign up information is provided on this After Visit Summary.  MyChart is used to connect with patients for Virtual Visits (Telemedicine).  Patients are able to view lab/test results, encounter notes, upcoming appointments, etc.  Non-urgent messages can be sent to your provider as well.   To learn more about what you can do with MyChart, go to [email or web address].    Your next appointment:    6 month(s)  The format for your next appointment:   In Person  Provider:   You may see Lanier Prude, MD or one of the following Advanced Practice Providers on your designated Care Team:   Francis Dowse, [place]    Other Instructions   Important Information About Sugar

## 2022-01-05 ENCOUNTER — Other Ambulatory Visit: Payer: Self-pay | Admitting: *Deleted

## 2022-01-05 LAB — POTASSIUM: Potassium: 4.7 mmol/L (ref 3.5–5.2)

## 2022-01-05 MED ORDER — POTASSIUM CHLORIDE CRYS ER 10 MEQ PO TBCR: 20.0000 meq | EXTENDED_RELEASE_TABLET | Freq: Every day | ORAL | 1 refills | Status: DC

## 2022-01-20 ENCOUNTER — Ambulatory Visit (HOSPITAL_COMMUNITY)
Admission: RE | Admit: 2022-01-20 | Discharge: 2022-01-20 | Disposition: A | Discharge status: Home / Self Care | Payer: Medicare HMO | Source: Ambulatory Visit | Attending: Adult Health | Admitting: Adult Health

## 2022-01-20 ENCOUNTER — Encounter (HOSPITAL_COMMUNITY): Payer: Self-pay

## 2022-01-20 VITALS — BP 148/82 | HR 71 | Wt 124.8 lb

## 2022-01-20 DIAGNOSIS — R5381 Other malaise: Secondary | ICD-10-CM | POA: Diagnosis not present

## 2022-01-20 DIAGNOSIS — I4821 Permanent atrial fibrillation: Secondary | ICD-10-CM | POA: Diagnosis not present

## 2022-01-20 DIAGNOSIS — I5032 Chronic diastolic (congestive) heart failure: Secondary | ICD-10-CM | POA: Diagnosis not present

## 2022-01-20 NOTE — Patient Instructions (Addendum)
Thank you for coming in today  NO LABS today we will call your primary care and have your labs faxed over to [place]      Do the following things EVERYDAY: Weigh yourself in the morning before breakfast. Write it down and keep it in a log. Take your medicines as prescribed Eat low salt foods--Limit salt (sodium) to 2000 mg per day.  Stay as active as you can everyday Limit all fluids for the day to less than 2 liters   At [facility], you and your health needs are our priority. As part of our continuing mission to provide you with exceptional heart care, we have created designated Provider Care Teams. These Care Teams include your primary Cardiologist (physician) and Advanced Practice Providers (APPs- Physician Assistants and Nurse Practitioners) who all work together to provide you with the care you need, when you need it.   You may see any of the following providers on your designated Care Team at your next follow up: Dr Arvilla Meres Dr Marca Ancona Dr. Marcos Eke, NP Robbie Lis, [name] [facility] [place], [name] Brynda Peon, NP Karle Plumber, PharmD   Please be sure to bring in all your medications bottles to every appointment.   If you have any questions or concerns before your next appointment please send [place] a message through [place] or call our office at [phone].    TO LEAVE A MESSAGE FOR THE NURSE SELECT OPTION 2, PLEASE LEAVE A MESSAGE INCLUDING: YOUR NAME DATE OF BIRTH CALL BACK NUMBER REASON FOR CALL**this is important as we prioritize the call backs  YOU WILL RECEIVE A CALL BACK THE SAME DAY AS LONG AS YOU CALL BEFORE 4:00 PM

## 2022-01-20 NOTE — Progress Notes (Signed)
Spoke with Lelon Mast at [facility] physician office and stated that she would at pt recent [facility] labs faxed over to our clinic at [phone].

## 2022-01-20 NOTE — Progress Notes (Addendum)
ADVANCED HF CLINIC NOTE  Referring Physician: Dr. Lalla Brothers Primary Care: Claris Gower  Primary Cardiologist: EP Steffanie Dunn HF MD: Dr Tasia Catchings  HPI: Lindsey Huber is a 76 yo female with chronic diastolic CHF, permanent AF, AFL s/p ablation, SLE, pulmonary fibrosis, pulmonary hypertension. paroxysmal VT, OSA not on cpap and valvular disease s/p AVR/MV ring/TVR. Referred by Dr. Lalla Brothers in 10/22 for further evaluation of PAH and valvular heart disease.  History of valvular disease with multiple replaced valves AVR/MV ring/TVR 09/25/2010.  Paroxysmal VT, ICD placed due to torsades on Flecainide with SSS requiring PPM which predated valve repar/replacement surgery 09/25/2010 and in fact ICD lead was damaged during valve replacement surgery.       History of chronic COPD and pulmonary fibrosis followed by pulmonology.  On triple therapy for COPD vs asthma,  nintedanib for PF. 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  Did not smoke but inhaled second hand smoke from husband and mother pretty much her whole life until 01/2020.  Continued nintedanib switched inhaler therapy to [facility].    Echocardiogram 12/06/2018: EF 63%,normal bioprosthetic tricuspid and aortic valve with mild tricuspid regurgitation, mitral valve repair with mild mitral regurgitation, severe biatrial enlargement.   Admitted in [place], [place] for hypotension and acute kidney injury 03/10/2020.  She was placed on broad-spectrum antibiotics with initial diagnosis of sepsis, no source of infection found also required Levophed and IVF infusion to support blood pressure, started on COPD therapy.  There was question of whether adrenal insufficiency contributing.  Steroid therapy reinstituted.   Echo 03/11/2020  LVEF 55 to 60%, moderate to severe tricuspid regurgitation, moderately elevated right ventricular systolic pressure estimate 62mm Hg, moderate mitral regurgitation.  RV not well visualized.    Seen in HF Clinic for  first visit 10/27/20. Had NYHA IIIB symptoms. Referred for R/L cath--> normal coronaries, EF 60-65%, and normal left sided filling pressures.   R/LHC 11/27/20 Ao =129/63 (89) LV = 141/8 RA = 11 (v waves to 14) RV = 43/4 PA = 42/15 (25) PCW = 10 Fick cardiac output/index = 5.7/3.7 PVR = 2.1 WU SVR = 1101 PAPi = 2.45 WU Ao sat = 96% PA sat =  74%, 75% SVC sat = 82% MVA (LV-wedge tracing) = 3.5cm2 mean gradient across MV 3.22mmHG  Assessment:  1. Normal coronary arteries 2. EF 60-65% 3. Very mild PAH 4. Normal left-sided filling pressures with no significant v-waves in PCWP tracing to suggest hemodynamically significant MR 5. No significant stenosis across AoV, MV or TV  Admitted 11/22 with acute LGIB. CT scan showed acute GI hemorrhage involving mid descending colon, likely acute diverticular bleed. Transfused 3u RBCs. Colonoscopy 11/26 which noted hemorrhoids and diverticulosis, no active bleeding, recommended to restart Xarelto in 3 days if stable.   She was seen in the ED 03/14/21 for difficulty with word finding. CT Head with no acute findings. Discharged the same day and has follow up with Neurology.   Admitted 12/26/21 with presyncope and symptomatic anemia.  Initially seen at Urgent Care to evaluate hematoma after a fall. She was leaving and had presyncope + increased dyspnea. She was taken to [facility] ED and admitted 12/26/21 with presyncope and symptomatic anemia.  . Given transfusion. Xarelto held 5 days. Had flash pulmonary edema and was given IV lasix. Discharged to home 12/29/21.    She has follow up with PCP with stable blood work  CBC + BMET .   Today she returns for HF follow up with her  daughter. Overall feeling fine. Uses a rolling walker in her home. Remains SOB with exertion but this is her baseline. Denies PND/Orthopnea. R hip/hematoma resolving. Limited by joint pain. Appetite ok. No fever or chills. Weight at home 123-125  pounds. Taking all medications. She has not needed  extra lasix.    Past Medical History:  Diagnosis Date   Achalasia    Acquired hypothyroidism 123456   Acute metabolic encephalopathy 123456   AICD (automatic cardioverter/defibrillator) present 11/14/2019   Allergic rhinitis    Aortic valve disorder 03/26/2002   Atherosclerosis of abdominal aorta 05/01/2020   Cardiomyopathy    CHB (complete heart block) 08/18/2017   CHF (congestive heart failure)    Cholelithiasis without obstruction 05/01/2020   Chronic gouty arthritis    Chronic heart failure with preserved ejection fraction 05/01/2020   Chronic kidney disease (CKD), active medical management without dialysis, stage 3 (moderate) 05/01/2020   Closed torus fracture of distal end of right radius with delayed healing 08/12/2021   Delusional thoughts    Diverticulosis of colon 05/01/2020   Epistaxis    Essential (primary) hypertension 08/18/2017   Gastroesophageal reflux disease 05/01/2020   Gastrointestinal hemorrhage    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 11/14/2019   Hypercoagulability due to atrial fibrillation 05/01/2020   Hyperlipidemia 11/14/2019   Hypocalcemia 12/14/2020   Hypoglycemia    Hypokalemia 12/14/2020   Hypomagnesemia 12/14/2020   Hypotension 12/14/2020   Idiopathic pulmonary fibrosis 05/01/2020   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   Iron deficiency anemia    Long term (current) use of anticoagulants    Malnutrition of mild degree [name]: 75% to less than 90% of standard weight)    Mild dementia 08/12/2021   Mitral and aortic incompetence 11/14/2019   Non-rheumatic atrial fibrillation 05/01/2020   Non-toxic multinodular goiter 08/20/2020   NSVT (nonsustained ventricular tachycardia) 08/18/2017   Oropharyngeal dysphagia    Osteoarthritis of hip 05/01/2020   Osteoarthritis of knee 05/01/2020   Osteopenia of neck of left femur    Pain of left hip joint 12/12/2017   Proteinuria 05/01/2020   Raynaud's disease    Recurrent  falls 04/09/2021   Rheumatoid arthritis    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     albuterol (VENTOLIN HFA) 108 (90 Base) MCG/ACT inhaler Inhale 2 puffs into the lungs every 6 (six) hours as needed for wheezing or shortness of breath.     allopurinol (ZYLOPRIM) 100 MG tablet TAKE 1 TABLET BY MOUTH EVERY DAY (Patient taking differently: Take 100 mg by mouth daily.) 90 tablet 1   amoxicillin (AMOXIL) 500 MG capsule 4 capsules by mouth 1 hour before dental procedure.     Budeson-Glycopyrrol-Formoterol (BREZTRI AEROSPHERE) 160-9-4.8 MCG/ACT AERO Inhale 2 puffs into the lungs 2 (two) times daily as needed ("for flares").     Cholecalciferol (VITAMIN D3) 25 MCG (1000 UT) CAPS Take 1,000 Units by mouth daily with lunch.     famotidine (PEPCID) 20 MG tablet Take 20 mg by mouth daily as needed for heartburn or indigestion.     ferrous sulfate 325 (65 FE)  MG tablet Take 325 mg by mouth See admin instructions. Take 325 mg by mouth with breakfast and supper     furosemide (LASIX) 40 MG tablet Take 1 tablet (40 mg total) by mouth See admin instructions. Take 40 mg by mouth in the morning (scheduled) and an additional 40 mg at bedtime if swelling is present. HOLD IF SYSTOLIC READING IS 123456 OR DIASTOLIC READING IS 123XX123. 90 tablet 1   Glycerin-Hypromellose-PEG 400 (DRY EYE RELIEF DROPS OP) Place 1 drop into both eyes in the morning.     hydroxychloroquine (PLAQUENIL) 200 MG tablet Take 1 tablet (200 mg total) by mouth daily. 90 tablet 1   levothyroxine (SYNTHROID) 25 MCG tablet Take 1 tablet  (25 mcg total) by mouth daily. (Patient taking differently: Take 25 mcg by mouth daily before breakfast.) 90 tablet 3   magnesium oxide (MAG-OX) 400 MG tablet Take 1 tablet (400 mg total) by mouth daily. 90 tablet 2   metoprolol succinate (TOPROL-XL) 25 MG 24 hr tablet Take 1 tablet (25 mg total) by mouth daily. Hold dose if <110 SBP or <60 DBP (Patient taking differently: Take 25 mg by mouth See admin instructions. Take 25 mg by mouth once a day and HOLD IF systolic reading is 123456 OR diastolic reading is 123XX123) 90 tablet 3   Nintedanib (OFEV) 150 MG CAPS Take 150 mg by mouth at bedtime.     OLANZapine (ZYPREXA) 2.5 MG tablet TAKE 1 TABLET BY MOUTH EVERYDAY AT BEDTIME (Patient taking differently: Take 2.5 mg by mouth at bedtime.) 90 tablet 0   potassium chloride SA (KLOR-CON M) 10 MEQ tablet Take 2 tablets (20 mEq total) by mouth daily. 180 tablet 1   predniSONE (DELTASONE) 5 MG tablet Take 1 tablet (5 mg total) by mouth daily with breakfast. Patient to double up dose during sick day rule 100 tablet 3   Rivaroxaban (XARELTO) 15 MG TABS tablet Take 1 tablet (15 mg total) by mouth daily with supper. 90 tablet 1   No current facility-administered medications for this encounter.    Allergies  Allergen Reactions   Other Other (See Comments)    Patient is to NOT EAT any foods with husks or tree nuts, popcorn   Propofol Shortness Of Breath and Other (See Comments)    "Caused asthma"   Flecainide Other (See Comments)    Reaction not recalled   Amiodarone Other (See Comments)    Delirium/Confusion/Psychosis   Amlodipine Other (See Comments)    "Tired and syncope"      Social History   Socioeconomic History   Marital status: Legally Separated    Spouse name: Not on file   Number of children: Not on file   Years of education: 16   Highest education level: Bachelor's degree (e.g., BA, AB, BS)  Occupational History   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  Tobacco Use   Smoking  status: Never    Passive exposure: Past   Smokeless tobacco: Never  Vaping Use   Vaping Use: Never used  Substance and Sexual Activity   Alcohol use: Never   Drug use: Never   Sexual activity: Not Currently  Other Topics Concern   Not on file  Social History Narrative   Right Handed    Social Determinants of Health   Financial Resource Strain: Not on file  Food Insecurity: No Food Insecurity (12/26/2021)   Hunger Vital Sign    Worried About Running Out of Food in the Last Year: Never  true    Ran Out of Food in the Last Year: Never true  Transportation Needs: No Transportation Needs (12/26/2021)   PRAPARE - Hydrologist (Medical): No    Lack of Transportation (Non-Medical): No  Physical Activity: Not on file  Stress: Not on file  Social Connections: Not on file  Intimate Partner Violence: Not At Risk (12/26/2021)   Humiliation, Afraid, Rape, and Kick questionnaire    Fear of Current or Ex-Partner: No    Emotionally Abused: No    Physically Abused: No    Sexually Abused: No      Family History  Problem Relation Age of Onset   Heart disease Mother    Hypertension Mother    Rheum arthritis Mother    Osteoarthritis Mother    Heart disease Father    Hypertension Father    Osteoarthritis Father    Heart disease Sister    Diabetes Brother    Cancer Brother     Vitals:   01/20/22 1029  BP: (!) 148/82  Pulse: 71  SpO2: 100%  Weight: 56.6 kg (124 lb 12.8 oz)     Wt Readings from Last 3 Encounters:  01/20/22 56.6 kg (124 lb 12.8 oz)  01/04/22 58.5 kg (129 lb)  12/29/21 59 kg (130 lb 1.1 oz)    PHYSICAL EXAM: General:  Arrived in a wheelchair. No resp difficulty HEENT: normal Neck: supple. no JVD. Carotids 2+ bilat; no bruits. No lymphadenopathy or thryomegaly appreciated. Cor: PMI nondisplaced. Regular rate & rhythm. No rubs, gallops or murmurs. Lungs: clear Abdomen: soft, nontender, nondistended. No hepatosplenomegaly. No bruits or  masses. Good bowel sounds. Extremities: no cyanosis, clubbing, rash, edema. R and LLE trace edema around ankles.  Neuro: alert & orientedx3, cranial nerves grossly intact. moves all 4 extremities w/o difficulty. Affect pleasant    ASSESSMENT & PLAN:  Chronic Diastolic CHF due to valvular heart disease -s/p bioprosthetic AVR/MV ring/bioprosthetic TVR 09/25/2010 -Echo 03/11/2020  LVEF 55 to 60%, moderate to severe tricuspid regurgitation, moderately elevated right ventricular systolic pressure estimate 34mm Hg, moderate mitral regurgitation.  RV not well visualized. AV function not assessed. - [facility] 11/22: Mild PAH. RA = 11 (v waves to 14) PA = 42/15 (25) PCW = 10 Fick cardiac output/index = 5.7/3.7 PVR = 2.1 WU MVA (LV-wedge tracing) = 3.5cm2 mean gradient across MV 3.67mmHG - NYHA III. Optivol- Fluid index low. Activity < 1 hour daily. Discussed Optivol results.  - Volume status stable.  Continue lasix 40 mg daily with an extra 40 mg as needed.   - Repeat Echo in February with Dr Haroldine Laws.    2. Pulmonary HTN by ECHO - [facility] 11/22 with mild PAH and normal PVR. (See above)  3. ILD -11/2018 HRCT with stable ILD compared with 2018.  2018 CT Slowly progressive basal predominant, moderately severe pulmonary fibrosis. Favor NSIP -no visible PFT's on care everywhere -Needs follow up with [facility].   4. OSA -resolved with weight loss and cpap discontinued  5. Permanent Afib with SSS - has ICD in place due to h/o VT  -Followed by Dr. Quentin Ore, 04/2020 was 99% V pacing.  - No recent falls.   - On xarelto  6. H/o VT - ICD in palce  7. SLE -followed by Dr. Benjamine Mola, lupus felt to be under good control -continued on HCQ and she is also on 5mg  prednisone for secondary adrenal insufficiency  8. H/o diverticular bleed - seems to have resolved  9. Hematoma, due  to fall  -Resolving. She has follow up with ortho.  - Continue rolling walker.    Follow up Dr Haroldine Laws in February with an ECHO    Lindsey Grinder, NP  10:41 AM

## 2022-02-02 ENCOUNTER — Ambulatory Visit (INDEPENDENT_AMBULATORY_CARE_PROVIDER_SITE_OTHER): Payer: Medicare HMO | Admitting: Pulmonary Disease

## 2022-02-02 ENCOUNTER — Encounter: Payer: Self-pay | Admitting: Pulmonary Disease

## 2022-02-02 VITALS — BP 140/72 | HR 80 | Wt 126.8 lb

## 2022-02-02 DIAGNOSIS — J84112 Idiopathic pulmonary fibrosis: Secondary | ICD-10-CM | POA: Diagnosis not present

## 2022-02-02 MED ORDER — FLUTICASONE PROPIONATE 50 MCG/ACT NA SUSP: 1.0000 | Freq: Two times a day (BID) | NASAL | 2 refills | Status: DC

## 2022-02-02 NOTE — Patient Instructions (Addendum)
Nice to see you again  Continue Breztri and [employer] as you are  Try flonase 1 spray each nostril twice a day for 1 week then can back off to once a day  Retrun to clinic in 3 months or sooner as needed with Dr. Silas Flood

## 2022-02-02 NOTE — Progress Notes (Signed)
@Patient  ID: Lindsey Huber, female    DOB: [birth year], 77 y.o.   MRN: [id number]  Chief Complaint  Patient presents with   Follow-up    Over due follow up for IPF. Pt states she does have some SOB. Went to the hospital about a month ago but she is doing ok now. SOB when walking. Pt is still on Breztri and she does states that it does help sometimes with the SOB.     Referring provider: Scheryl Marten, PA  HPI:   77 y.o. woman whom I am seeing in follow up and ongoing care of multiple pulmonary issues. Discharge summary 12/23 reviewed. Most recent cardiology notes x 2 reviewed.  Overdue for follow-up, seen well over a year ago.  Hospitalized 12/23 after fall, had leg hematoma. Diuresed found to be in heart failure. Transfused for anemia. Was having worsening shortness of breath.  Since discharge, she is feeling a bit better.  No further falls.  She reports adherence to Breztri 2 puff twice daily.  Feeling his house.  Uses her rescue inhaler about twice a week.  This provides some relief.  She reports good adherence to Ofev.  On 150 mg twice daily.  This is based on prior pulmonary fibrosis.  HPI at initial visit: Patient recently moved to the area from [place].  Had multiple specialist including cardiology, pulmonary.  Chronic dyspnea for some time.  Diagnosed with RA related ILD.  Started on Ofev in the past.  Continues this.  Occasional diarrhea.  Dyspnea worse on inclines or stairs.  Present on flat surfaces over short distances as well.  No time of day when things are better or worse.  Dyspnea constant.  No seasonal environmental factors she can identify that make this better or worse.  No position make things better or worse.  No clear alleviating exacerbating factors.  She continues on inhalers.  Currently Trelegy.  PMH: ILD, valvular issues, heart issues, Surgical history: Heart valve surgery, pacemaker placement, tonsillectomy Family history: Mother had CAD, hypertension  Father with CAD, hypertension Social history: Never smoker, recently moved to the area, lives in [place] / Pulmonary Flowsheets:   ACT:      No data to display          MMRC:     No data to display          Epworth:      No data to display          Tests:   FENO:  No results found for: "NITRICOXIDE"  PFT:     No data to display          WALK:     08/22/2020    2:52 PM  SIX MIN WALK  Medications levothyroxine 66mcg at 6am, allopurinol 100mg , hydroxychloroquine 200mg , prednisone 5mg  at 7am, and multivitamin at 12pm  Supplimental Oxygen during Test? (L/min) No  Laps 6  Partial Lap (in Meters) 0 meters  Baseline BP (sitting) 120/72  Baseline Heartrate 91  Baseline Dyspnea (Borg Scale) 3  Baseline Fatigue (Borg Scale) 2  Baseline SPO2 100 %  BP (sitting) 150/100  Heartrate 95  Dyspnea (Borg Scale) 6  Fatigue (Borg Scale) 4  SPO2 97 %  BP (sitting) 138/90  Heartrate 90  SPO2 100 %  Stopped or Paused before Six Minutes Yes  Other Symptoms at end of Exercise paused with 1 min 50 sec remaining for 2 minutes due to needing to catch her breath  Distance Completed  204 meters  Distance Completed 0 meters  Tech Comments: pt walked at a slow pace with a left knee brace on, stumbled multiple times against the wall due to the left knee and brace. pt paused with 1 min 50 sec remaining for 2 min to catch breath but was able to fully finish the walk.   Imaging: None reviewed, requested  Lab Results:  CBC Personally reviewed most recent labs from PCP with minimal records sent    Component Value Date/Time   WBC 5.9 12/29/2021 0459   RBC 2.93 (L) 12/29/2021 0459   HGB 9.7 (L) 12/29/2021 0459   HCT 29.8 (L) 12/29/2021 0459   HCT NLAV 07/25/2021 1818   PLT 175 12/29/2021 0459   MCV 101.7 (H) 12/29/2021 0459   MCH 33.1 12/29/2021 0459   MCHC 32.6 12/29/2021 0459   RDW 15.4 12/29/2021 0459   LYMPHSABS 1.4 12/26/2021 1350   MONOABS 0.6  12/26/2021 1350   EOSABS 0.0 12/26/2021 1350   BASOSABS 0.0 12/26/2021 1350    BMET    Component Value Date/Time   NA 136 12/29/2021 0459   NA 142 12/04/2021 1436   K 4.7 01/04/2022 1636   CL 105 12/29/2021 0459   CO2 23 12/29/2021 0459   GLUCOSE 86 12/29/2021 0459   BUN 31 (H) 12/29/2021 0459   BUN 32 (H) 12/04/2021 1436   CREATININE 1.04 (H) 12/29/2021 0459   CREATININE 0.88 10/26/2021 1508   CALCIUM 8.3 (L) 12/29/2021 0459   GFRNONAA 56 (L) 12/29/2021 0459    BNP    Component Value Date/Time   BNP 718.0 (H) 12/26/2021 1350    ProBNP No results found for: "PROBNP"  Specialty Problems       Pulmonary Problems   Idiopathic pulmonary fibrosis   Allergic rhinitis   Epistaxis   Oropharyngeal dysphagia    Allergies  Allergen Reactions   Other Other (See Comments)    Patient is to NOT EAT any foods with husks or tree nuts, popcorn   Propofol Shortness Of Breath and Other (See Comments)    "Caused asthma"   Flecainide Other (See Comments)    Reaction not recalled   Amiodarone Other (See Comments)    Delirium/Confusion/Psychosis   Amlodipine Other (See Comments)    "Tired and syncope"    Immunization History  Administered Date(s) Administered   Influenza-Unspecified 10/26/2019, 10/07/2021   PFIZER(Purple Top)SARS-COV-2 Vaccination 04/17/2019, 05/08/2019, 11/24/2019    Past Medical History:  Diagnosis Date   Achalasia    Acquired hypothyroidism 08/20/2020   Acute metabolic encephalopathy 07/25/2021   AICD (automatic cardioverter/defibrillator) present 11/14/2019   Allergic rhinitis    Aortic valve disorder 03/26/2002   Atherosclerosis of abdominal aorta 05/01/2020   Cardiomyopathy    CHB (complete heart block) 08/18/2017   CHF (congestive heart failure)    Cholelithiasis without obstruction 05/01/2020   Chronic gouty arthritis    Chronic heart failure with preserved ejection fraction 05/01/2020   Chronic kidney disease (CKD), active medical  management without dialysis, stage 3 (moderate) 05/01/2020   Closed torus fracture of distal end of right radius with delayed healing 08/12/2021   Delusional thoughts    Diverticulosis of colon 05/01/2020   Epistaxis    Essential (primary) hypertension 08/18/2017   Gastroesophageal reflux disease 05/01/2020   Gastrointestinal hemorrhage    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 11/14/2019   Hypercoagulability due to atrial fibrillation 05/01/2020   Hyperlipidemia 11/14/2019   Hypocalcemia 12/14/2020   Hypoglycemia  Hypokalemia 12/14/2020   Hypomagnesemia 12/14/2020   Hypotension 12/14/2020   Idiopathic pulmonary fibrosis 05/01/2020   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   Iron deficiency anemia    Long term (current) use of anticoagulants    Malnutrition of mild degree [name]: 75% to less than 90% of standard weight)    Mild dementia 08/12/2021   Mitral and aortic incompetence 11/14/2019   Non-rheumatic atrial fibrillation 05/01/2020   Non-toxic multinodular goiter 08/20/2020   NSVT (nonsustained ventricular tachycardia) 08/18/2017   Oropharyngeal dysphagia    Osteoarthritis of hip 05/01/2020   Osteoarthritis of knee 05/01/2020   Osteopenia of neck of left femur    Pain of left hip joint 12/12/2017   Proteinuria 05/01/2020   Raynaud's disease    Recurrent falls 04/09/2021   Rheumatoid arthritis    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   albuterol (VENTOLIN HFA) 108 (90 Base) MCG/ACT inhaler Inhale 2 puffs into the lungs every 6 (six) hours as needed for wheezing or shortness of breath.   allopurinol (ZYLOPRIM) 100 MG tablet TAKE 1 TABLET BY MOUTH EVERY DAY (Patient taking differently: Take 100 mg by mouth daily.)   amoxicillin (AMOXIL) 500 MG capsule 4 capsules by mouth 1 hour before dental procedure.   Budeson-Glycopyrrol-Formoterol (BREZTRI AEROSPHERE) 160-9-4.8 MCG/ACT AERO Inhale 2 puffs into the lungs 2 (two) times daily as needed ("for flares").   Cholecalciferol (VITAMIN D3) 25 MCG (1000 UT) CAPS Take 1,000 Units by mouth daily with lunch.   famotidine (PEPCID) 20 MG tablet Take 20 mg by mouth daily as needed for heartburn or indigestion.   ferrous sulfate 325 (65 FE) MG tablet Take 325 mg by mouth See admin instructions. Take 325 mg by mouth with breakfast and supper   fluticasone (FLONASE) 50 MCG/ACT nasal spray Place 1 spray into both nostrils 2 (two) times daily.   furosemide (LASIX) 40 MG tablet Take 1 tablet (40 mg total) by mouth See admin instructions. Take 40 mg by mouth in the morning (scheduled) and an additional 40 mg at bedtime if swelling is present. HOLD IF SYSTOLIC READING IS <110 OR DIASTOLIC READING IS <60.   Glycerin-Hypromellose-PEG 400 (DRY EYE RELIEF DROPS OP) Place 1 drop into both eyes in the morning.   hydroxychloroquine (PLAQUENIL) 200 MG tablet Take 1 tablet (200 mg total) by mouth daily.   levothyroxine (SYNTHROID) 25 MCG tablet Take 1 tablet (25 mcg total) by mouth daily. (Patient taking differently: Take 25 mcg by mouth daily before breakfast.)   magnesium oxide (MAG-OX) 400 MG tablet Take 1 tablet (400 mg total) by mouth daily.   metoprolol succinate (TOPROL-XL) 25 MG 24 hr tablet Take 1 tablet (25 mg total) by mouth daily. Hold dose if <110 SBP or <60 DBP (Patient taking differently: Take 25 mg by  mouth See admin instructions. Take 25 mg by mouth once a day and HOLD IF systolic reading is <  110 OR diastolic reading is <60)   Nintedanib (OFEV) 150 MG CAPS Take 150 mg by mouth at bedtime.   Nintedanib (OFEV) 150 MG CAPS Take by mouth.   OLANZapine (ZYPREXA) 2.5 MG tablet TAKE 1 TABLET BY MOUTH EVERYDAY AT BEDTIME (Patient taking differently: Take 2.5 mg by mouth at bedtime.)   potassium chloride SA (KLOR-CON M) 10 MEQ tablet Take 2 tablets (20 mEq total) by mouth daily.   predniSONE (DELTASONE) 5 MG tablet Take 1 tablet (5 mg total) by mouth daily with breakfast. Patient to double up dose during sick day rule   Rivaroxaban (XARELTO) 15 MG TABS tablet Take 1 tablet (15 mg total) by mouth daily with supper.   No facility-administered encounter medications on file as of 02/02/2022.     Review of Systems  Review of Systems  N/a Physical Exam  BP (!) 140/72 (BP Location: Left Arm, Patient Position: Sitting, Cuff Size: Normal) Comment: have not taken BP medication this morning  Pulse 80   Wt 126 lb 12.8 oz (57.5 kg)   SpO2 97%   BMI 22.46 kg/m   Wt Readings from Last 5 Encounters:  02/02/22 126 lb 12.8 oz (57.5 kg)  01/20/22 124 lb 12.8 oz (56.6 kg)  01/04/22 129 lb (58.5 kg)  12/29/21 130 lb 1.1 oz (59 kg)  12/24/21 128 lb 3.2 oz (58.2 kg)    BMI Readings from Last 5 Encounters:  02/02/22 22.46 kg/m  01/20/22 22.11 kg/m  01/04/22 22.85 kg/m  12/29/21 23.04 kg/m  12/24/21 23.45 kg/m     Physical Exam General: Frail, sitting in chair Eyes: EOMI, no icterus Neck: Supple, no JVP Cardiovascular: Significant 3/6 late systolic murmur heard best right upper sternal border, regular rate Pulmonary: Crackles in bilateral bases, otherwise clear to auscultation bilaterally Abdomen: Nondistended, bowel sounds present MSK: No synovitis, joint effusion Neuro: Ambulates with assistance of cane, otherwise no weakness Psych: Normal mood, full affect   Assessment & Plan:   ILD:  Presumed IPF per pulmonary notes, on Ofev 150 BID.  To continue this.  Consider repeat CT scan in future if symptoms worsen. LFTs ok 12/23 - repeat at next visit -intensive drug monitoring.  Dyspnea exertion: Related to ILD, pulmonary hypertension, asthma.  On therapy for ILD as above.  Mild improvement with escalation of inhaler therapy to [facility].. Consider PFTs in the future if symptoms fail to improve or worsen.  Asthma: Increase DOE, wheeze, atopic symptoms summer 2022.  Transition Trelegy to [facility] with improved symptoms.  Using rescue inhaler about twice a week..   Pulmonary hypertension: Presumed based on prior TTE.  Never on therapy.  Right heart cath 11/2020 with mild pulmonary hypertension a PVR of 2, normal wedge.  Reportedly significant valvular disease, like combination  of group 2 and group 3.  She is poor candidate for therapy based on her age, frailty.    Return in about 3 months (around 05/04/2022).   Karren Burly, MD 02/02/2022

## 2022-02-16 ENCOUNTER — Ambulatory Visit: Payer: Medicare HMO | Attending: Cardiology

## 2022-02-16 ENCOUNTER — Other Ambulatory Visit (HOSPITAL_COMMUNITY): Payer: Self-pay | Admitting: Internal Medicine

## 2022-02-16 DIAGNOSIS — I429 Cardiomyopathy, unspecified: Secondary | ICD-10-CM | POA: Diagnosis not present

## 2022-02-16 LAB — CUP PACEART REMOTE DEVICE CHECK
Battery Remaining Longevity: 53 mo
Battery Voltage: 2.98 V
Brady Statistic RV Percent Paced: 99.77 %
Date Time Interrogation Session: 20240123073626
HighPow Impedance: 63 Ohm
Implantable Lead Connection Status: 753985
Implantable Lead Implant Date: 20120920
Implantable Lead Location: 753862
Implantable Pulse Generator Implant Date: 20190809
Lead Channel Impedance Value: 247 Ohm
Lead Channel Impedance Value: 304 Ohm
Lead Channel Pacing Threshold Amplitude: 0.625 V
Lead Channel Pacing Threshold Pulse Width: 0.4 ms
Lead Channel Sensing Intrinsic Amplitude: 8.5 mV
Lead Channel Sensing Intrinsic Amplitude: 8.5 mV
Lead Channel Setting Pacing Amplitude: 1.25 V
Lead Channel Setting Pacing Pulse Width: 0.4 ms
Lead Channel Setting Sensing Sensitivity: 0.3 mV
Zone Setting Status: 755011
Zone Setting Status: 755011

## 2022-02-24 ENCOUNTER — Other Ambulatory Visit: Payer: Self-pay | Admitting: Physician Assistant

## 2022-03-05 ENCOUNTER — Encounter: Payer: Self-pay | Admitting: Internal Medicine

## 2022-03-15 NOTE — Progress Notes (Unsigned)
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  Pending results, may start rivastigmine vs donepezil Continue olanzapine 2.74m at bedtime Follow up in 9 months.     Subjective:  Lindsey Grewellis a 77year old right-handed female with a fib, cardiomyopathy, HTN, CHF, lupus, pulmonary fibrosis, untreated OSA and CKD who follows up for TIA and neurocognitive disorder.  She is accompanied by her daughter who supplements history.   UPDATE: DaT scan was ordered but nobody reached out to schedule it.     Last visit, started olanzapine 2.576mat bedtime.  Hallucinations have been minimal.  Sleeping well. Sometimes she may have some breakthrough episodes of hallucinations.     HISTORY:  On 07/01/2020, the patient experienced a mild headache off and on during the day.  She also had a left temporal headache and noted intermittent numbness and tingling in the bilateral lower extremities.  That evening, she was talking with family when she suddenly started holding the right side of her face, eyes became big and she started drooling.  When her family spoke to her, she could only moan.  She was unable to repeat.  She could not smile but did not exhibit facial droop or unilateral weakness.  After 5-6 minutes, she started speaking but it was slurred and didn't make sense.  She was unable to repeat phrases.  She has no recollection of this.  No convulsions or incontinence  She was brought to the ED.  CTA of head and neck personally reviewed was negative for large vessel occlusion or hemodynamically significant stenosis.  Unable to have an MRI due to pacemaker.  She has  a fib which is treated with Xarelto.  Routine awake and asleep EEG on 07/14/2020 was normal.   On the morning of 03/14/2021, she was noted by her daughter to be acting strange.  When she spoke, she had trouble putting words together and did not make sense.  After about an hour, she became unresponsive, started staring off, making moaning sounds and exhibited head twitching and shaking of both hands.  Lasted 2-3 minutes.  No incontinence or tongue biting.  She continued having some slurred speech and difficulty getting words out afterwards.  In hindsight, she says that she does remember feeling strange during this event and struggling to talk.  No lateralizing symptoms such as facial droop or unilateral limb weakness.  EMS was called and symptoms resolved en route to the ED.  2 days prior, she had a fall and landed on her left arm.  CT head personally reviewed revealed no acute intracranial abnormality.  MRI was unable to be performed because there were no [employer] reps that day who could come to the hospital to turn off the pacemaker.  She was found to have cellulitis on her left medial forearm where she had fallen.  Labs revealed no leukocytes and electrolytes normal.  She was discharged on Keflex.     Beginning in 2022, her daughter has been concerned about dementia.  She has been having both visual and auditory hallucinations.  For example, she saw a large spider web in her house that she said was put up by somebody.   She sometimes  hears people outside the window.  She also may exhibit paranoid delusions such as thinking her cousin is calling into radio stations and talking about her, or that her conversations are being broadcast throughout the apartment complex.  This isn't a frequent occurrence.  She sometimes has tremors of the hands.  She lives with her granddaughter and her daughter stops by everyday.  She is able to perform ADLs independently.  No family history of dementia.  B12 from November 2022 was  1,406.  TSH from January 2023 was 1.470.  24 hour ambulatory EEG on 4/5-04/30/2021 was normal.  No events captured.  Unable to get MRI of brain because she has reported abandoned leads.  CT head on 06/18/2021 and  07/14/2021 showed moderate chronic small vessel ischemic changes in the cerebral white matter but no acute findings.  Neuropsychological evaluation on 08/12/2021 was consistent with major neurocognitive disorder possibly indicating early stages of Lewy body dementia or vascular dementia but not suggestive of Alzheimer's, FTD or Parkinson's/atypical parkinson's etiology.  PAST MEDICAL HISTORY: Past Medical History:  Diagnosis Date   Achalasia    Acquired hypothyroidism 123456   Acute metabolic encephalopathy 123456   AICD (automatic cardioverter/defibrillator) present 11/14/2019   Allergic rhinitis    Aortic valve disorder 03/26/2002   Atherosclerosis of abdominal aorta 05/01/2020   Cardiomyopathy    CHB (complete heart block) 08/18/2017   CHF (congestive heart failure)    Cholelithiasis without obstruction 05/01/2020   Chronic gouty arthritis    Chronic heart failure with preserved ejection fraction 05/01/2020   Chronic kidney disease (CKD), active medical management without dialysis, stage 3 (moderate) 05/01/2020   Closed torus fracture of distal end of right radius with delayed healing 08/12/2021   Delusional thoughts    Diverticulosis of colon 05/01/2020   Epistaxis    Essential (primary) hypertension 08/18/2017   Gastroesophageal reflux disease 05/01/2020   Gastrointestinal hemorrhage    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 11/14/2019   Hypercoagulability due to atrial fibrillation 05/01/2020   Hyperlipidemia 11/14/2019   Hypocalcemia 12/14/2020   Hypoglycemia    Hypokalemia 12/14/2020   Hypomagnesemia 12/14/2020   Hypotension 12/14/2020   Idiopathic pulmonary fibrosis 05/01/2020   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   Iron deficiency  anemia    Long term (current) use of anticoagulants    Malnutrition of mild degree [name]: 75% to less than 90% of standard weight)    Mild dementia 08/12/2021   Mitral and aortic incompetence 11/14/2019   Non-rheumatic atrial fibrillation 05/01/2020   Non-toxic multinodular goiter 08/20/2020   NSVT (nonsustained ventricular tachycardia) 08/18/2017   Oropharyngeal dysphagia    Osteoarthritis of hip 05/01/2020   Osteoarthritis of knee 05/01/2020   Osteopenia of neck of left femur    Pain of left hip joint 12/12/2017   Proteinuria 05/01/2020   Raynaud's disease    Recurrent falls 04/09/2021   Rheumatoid arthritis    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     albuterol (VENTOLIN HFA) 108 (90 Base) MCG/ACT inhaler Inhale 2 puffs into the lungs  every 6 (six) hours as needed for wheezing or shortness of breath.     allopurinol (ZYLOPRIM) 100 MG tablet TAKE 1 TABLET BY MOUTH EVERY DAY (Patient taking differently: Take 100 mg by mouth daily.) 90 tablet 1   amoxicillin (AMOXIL) 500 MG capsule 4 capsules by mouth 1 hour before dental procedure.     Budeson-Glycopyrrol-Formoterol (BREZTRI AEROSPHERE) 160-9-4.8 MCG/ACT AERO Inhale 2 puffs into the lungs 2 (two) times daily as needed ("for flares").     Cholecalciferol (VITAMIN D3) 25 MCG (1000 UT) CAPS Take 1,000 Units by mouth daily with lunch.     famotidine (PEPCID) 20 MG tablet Take 20 mg by mouth daily as needed for heartburn or indigestion.     ferrous sulfate 325 (65 FE) MG tablet Take 325  mg by mouth See admin instructions. Take 325 mg by mouth with breakfast and supper     fluticasone (FLONASE) 50 MCG/ACT nasal spray Place 1 spray into both nostrils 2 (two) times daily. 16 g 2   furosemide (LASIX) 40 MG tablet TAKE TABLET BY MOUTH IN THE MORNING (SCHEDULED) AND AN ADDITIONAL TABLET AT BEDTIME IF SWELLING IS PRESENT. HOLD IF SYSTOLIC READING IS 123456 OR DIASTOLIC READING IS 123XX123. [id number] tablet 3   Glycerin-Hypromellose-PEG 400 (DRY EYE RELIEF DROPS OP) Place 1 drop into both eyes in the morning.     hydroxychloroquine (PLAQUENIL) 200 MG tablet Take 1 tablet (200 mg total) by mouth daily. 90 tablet 1   levothyroxine (SYNTHROID) 25 MCG tablet Take 1 tablet (25 mcg total) by mouth daily. (Patient taking differently: Take 25 mcg by mouth daily before breakfast.) 90 tablet 3   magnesium oxide (MAG-OX) 400 MG tablet Take 1 tablet (400 mg total) by mouth daily. 90 tablet 2   metoprolol succinate (TOPROL-XL) 25 MG 24 hr tablet Take 1 tablet (25 mg total) by mouth daily. Hold dose if <110 SBP or <60 DBP (Patient taking differently: Take 25 mg by mouth See admin instructions. Take 25 mg by mouth once a day and HOLD IF systolic reading is 123456 OR diastolic reading is 123XX123) 90 tablet 3   Nintedanib (OFEV) 150 MG CAPS Take 150 mg by mouth at bedtime.     Nintedanib (OFEV) 150 MG CAPS Take by mouth.     OLANZapine (ZYPREXA) 2.5 MG tablet TAKE 1 TABLET BY MOUTH EVERYDAY AT BEDTIME (Patient taking differently: Take 2.5 mg by mouth at bedtime.) 90 tablet 0   potassium chloride SA (KLOR-CON M) 10 MEQ tablet Take 2 tablets (20 mEq total) by mouth daily. 180 tablet 1   predniSONE (DELTASONE) 5 MG tablet Take 1 tablet (5 mg total) by mouth daily with breakfast. Patient to double up dose during sick day rule 100 tablet 3   Rivaroxaban (XARELTO) 15 MG TABS tablet Take 1 tablet (15 mg total) by mouth daily with supper. 90 tablet 1   No current facility-administered medications on file prior to visit.     ALLERGIES: Allergies  Allergen Reactions   Other Other (See Comments)    Patient is to NOT EAT any foods with husks or tree nuts, popcorn   Propofol Shortness Of Breath and Other (See Comments)    "Caused asthma"   Flecainide Other (See Comments)    Reaction not recalled   Amiodarone Other (See Comments)    Delirium/Confusion/Psychosis   Amlodipine Other (See Comments)    "Tired and syncope"    FAMILY HISTORY: Family History  Problem Relation Age of Onset   Heart disease  Mother    Hypertension Mother    Rheum arthritis Mother    Osteoarthritis Mother    Heart disease Father    Hypertension Father    Osteoarthritis Father    Heart disease Sister    Diabetes Brother    Cancer Brother       Objective:  Blood pressure 130/79, pulse 70, height 5' 3"$  (1.6 m), weight 121 lb (54.9 kg), SpO2 96 %. General: No acute distress.  Patient appears well-groomed.   Head:  Normocephalic/atraumatic Eyes:  Fundi examined but not visualized Neck: supple, no paraspinal tenderness, full range of motion Heart:  Regular rate and rhythm Neurological Exam: alert and oriented to person, place, and time.  Speech fluent and not dysarthric, language intact.  CN II-XII intact. Bulk and tone normal, muscle strength 5-/5 throughout.  Sensation to light touch intact.  Deep tendon reflexes 1+ throughout  Finger to nose testing intact.  In wheelchair.  Gait testing deferred.   Lindsey Clines, DO  CC: Fredda Hammed, [name]

## 2022-03-15 NOTE — Progress Notes (Signed)
Remote ICD transmission.   

## 2022-03-16 ENCOUNTER — Encounter: Payer: Self-pay | Admitting: Neurology

## 2022-03-16 ENCOUNTER — Ambulatory Visit (INDEPENDENT_AMBULATORY_CARE_PROVIDER_SITE_OTHER): Payer: Medicare HMO | Admitting: Neurology

## 2022-03-16 VITALS — BP 130/79 | HR 70 | Ht 63.0 in | Wt 121.0 lb

## 2022-03-16 DIAGNOSIS — R251 Tremor, unspecified: Secondary | ICD-10-CM

## 2022-03-16 DIAGNOSIS — F0390 Unspecified dementia without behavioral disturbance: Secondary | ICD-10-CM

## 2022-03-16 MED ORDER — OLANZAPINE 2.5 MG PO TABS: 2.5000 mg | ORAL_TABLET | Freq: Every day | ORAL | 1 refills | Status: DC

## 2022-03-16 NOTE — Patient Instructions (Signed)
Continue olanzapine 2.38m at bedtime Will try to get a DaT scan of the brain to help differentiate between lewy body dementia and vascular dementia

## 2022-03-18 ENCOUNTER — Ambulatory Visit (HOSPITAL_COMMUNITY)
Admission: RE | Admit: 2022-03-18 | Discharge: 2022-03-18 | Disposition: A | Discharge status: Home / Self Care | Payer: Medicare HMO | Source: Ambulatory Visit | Attending: Internal Medicine | Admitting: Internal Medicine

## 2022-03-18 ENCOUNTER — Ambulatory Visit (HOSPITAL_BASED_OUTPATIENT_CLINIC_OR_DEPARTMENT_OTHER)
Admission: RE | Admit: 2022-03-18 | Discharge: 2022-03-18 | Disposition: A | Discharge status: Home / Self Care | Payer: Medicare HMO | Source: Ambulatory Visit | Attending: Internal Medicine | Admitting: Internal Medicine

## 2022-03-18 ENCOUNTER — Encounter (HOSPITAL_COMMUNITY): Payer: Self-pay | Admitting: Internal Medicine

## 2022-03-18 VITALS — BP 140/80 | HR 69 | Wt 121.0 lb

## 2022-03-18 DIAGNOSIS — R5381 Other malaise: Secondary | ICD-10-CM | POA: Diagnosis not present

## 2022-03-18 DIAGNOSIS — Z8673 Personal history of transient ischemic attack (TIA), and cerebral infarction without residual deficits: Secondary | ICD-10-CM | POA: Insufficient documentation

## 2022-03-18 DIAGNOSIS — M3214 Glomerular disease in systemic lupus erythematosus: Secondary | ICD-10-CM | POA: Insufficient documentation

## 2022-03-18 DIAGNOSIS — I442 Atrioventricular block, complete: Secondary | ICD-10-CM | POA: Diagnosis not present

## 2022-03-18 DIAGNOSIS — I5032 Chronic diastolic (congestive) heart failure: Secondary | ICD-10-CM | POA: Diagnosis not present

## 2022-03-18 DIAGNOSIS — Z954 Presence of other heart-valve replacement: Secondary | ICD-10-CM

## 2022-03-18 DIAGNOSIS — Z9889 Other specified postprocedural states: Secondary | ICD-10-CM

## 2022-03-18 DIAGNOSIS — Z95810 Presence of automatic (implantable) cardiac defibrillator: Secondary | ICD-10-CM | POA: Diagnosis not present

## 2022-03-18 DIAGNOSIS — R531 Weakness: Secondary | ICD-10-CM | POA: Diagnosis not present

## 2022-03-18 DIAGNOSIS — I083 Combined rheumatic disorders of mitral, aortic and tricuspid valves: Secondary | ICD-10-CM | POA: Diagnosis not present

## 2022-03-18 DIAGNOSIS — N189 Chronic kidney disease, unspecified: Secondary | ICD-10-CM | POA: Insufficient documentation

## 2022-03-18 DIAGNOSIS — I4821 Permanent atrial fibrillation: Secondary | ICD-10-CM | POA: Insufficient documentation

## 2022-03-18 DIAGNOSIS — J84112 Idiopathic pulmonary fibrosis: Secondary | ICD-10-CM | POA: Diagnosis not present

## 2022-03-18 DIAGNOSIS — I130 Hypertensive heart and chronic kidney disease with heart failure and stage 1 through stage 4 chronic kidney disease, or unspecified chronic kidney disease: Secondary | ICD-10-CM | POA: Insufficient documentation

## 2022-03-18 DIAGNOSIS — I272 Pulmonary hypertension, unspecified: Secondary | ICD-10-CM | POA: Diagnosis not present

## 2022-03-18 DIAGNOSIS — Z953 Presence of xenogenic heart valve: Secondary | ICD-10-CM | POA: Diagnosis not present

## 2022-03-18 DIAGNOSIS — I495 Sick sinus syndrome: Secondary | ICD-10-CM | POA: Insufficient documentation

## 2022-03-18 DIAGNOSIS — R296 Repeated falls: Secondary | ICD-10-CM | POA: Insufficient documentation

## 2022-03-18 DIAGNOSIS — Z952 Presence of prosthetic heart valve: Secondary | ICD-10-CM

## 2022-03-18 DIAGNOSIS — E2749 Other adrenocortical insufficiency: Secondary | ICD-10-CM | POA: Insufficient documentation

## 2022-03-18 DIAGNOSIS — Z7952 Long term (current) use of systemic steroids: Secondary | ICD-10-CM | POA: Diagnosis not present

## 2022-03-18 DIAGNOSIS — Z7722 Contact with and (suspected) exposure to environmental tobacco smoke (acute) (chronic): Secondary | ICD-10-CM | POA: Insufficient documentation

## 2022-03-18 DIAGNOSIS — Z7901 Long term (current) use of anticoagulants: Secondary | ICD-10-CM | POA: Diagnosis not present

## 2022-03-18 DIAGNOSIS — J449 Chronic obstructive pulmonary disease, unspecified: Secondary | ICD-10-CM | POA: Insufficient documentation

## 2022-03-18 LAB — ECHOCARDIOGRAM COMPLETE
AR max vel: 0.92 cm2
AV Area VTI: 1.02 cm2
AV Area mean vel: 0.92 cm2
AV Mean grad: 35 mmHg
AV Peak grad: 62.7 mmHg
Ao pk vel: 3.96 m/s
Area-P 1/2: 3.27 cm2
Calc EF: 64.4 %
MV VTI: 2.21 cm2
S' Lateral: 3 cm
Single Plane A2C EF: 62.7 %
Single Plane A4C EF: 69.2 %

## 2022-03-18 NOTE — Patient Instructions (Signed)
There has been no changes to your medications.  Your physician recommends that you schedule a follow-up appointment in: 9 months (November ) ** please call the office in August to arrange your follow up appointment. **  If you have any questions or concerns before your next appointment please send [place] a message through [place] or call our office at [phone].    TO LEAVE A MESSAGE FOR THE NURSE SELECT OPTION 2, PLEASE LEAVE A MESSAGE INCLUDING: YOUR NAME DATE OF BIRTH CALL BACK NUMBER REASON FOR CALL**this is important as we prioritize the call backs  YOU WILL RECEIVE A CALL BACK THE SAME DAY AS LONG AS YOU CALL BEFORE 4:00 PM  At the [facility], you and your health needs are our priority. As part of our continuing mission to provide you with exceptional heart care, we have created designated Provider Care Teams. These Care Teams include your primary Cardiologist (physician) and Advanced Practice Providers (APPs- Physician Assistants and Nurse Practitioners) who all work together to provide you with the care you need, when you need it.   You may see any of the following providers on your designated Care Team at your next follow up: Dr Glori Bickers Dr Loralie Champagne Dr. Roxana Hires, NP Lyda Jester, [name] [facility] [place], [name] Forestine Na, NP Audry Riles, PharmD   Please be sure to bring in all your medications bottles to every appointment.    Thank you for choosing [facility]

## 2022-03-18 NOTE — Progress Notes (Addendum)
ADVANCED HF CLINIC NOTE  Referring Physician: Dr. Quentin Ore Primary Care: Vena Austria  Primary Cardiologist: EP Lars Mage HF MD: Dr Denice Paradise  HPI: Lindsey Huber is a 77 yo female with chronic diastolic CHF, permanent AF, AFL s/p ablation, SLE, pulmonary fibrosis, pulmonary hypertension. paroxysmal VT, OSA not on cpap and valvular disease s/p AVR/MV ring/TVR. Referred by Dr. Quentin Ore in 10/22 for further evaluation of PAH and valvular heart disease.  Underwent  AVR/MV ring/TVR 09/25/2010.  Paroxysmal VT, ICD placed due to torsades on Flecainide with SSS requiring PPM which predated valve repar/replacement surgery 09/25/2010 and in fact ICD lead was damaged during valve replacement surgery.       History of chronic COPD and pulmonary fibrosis followed by pulmonology.  On triple therapy for COPD vs asthma, nintedanib for IPF. Followed by Dr Silas Flood, Did not smoke but inhaled second hand smoke from husband and mother pretty much her whole life until 01/2020.   Echocardiogram 11/020: EF 63%,normal bioprosthetic tricuspid and aortic valve with mild tricuspid regurgitation, mitral valve repair with mild mitral regurgitation, severe biatrial enlargement.   Admitted in [place], [place] for hypotension and acute kidney injury 03/10/2020.  She was placed on broad-spectrum antibiotics with initial diagnosis of sepsis, no source of infection found also required Levophed and IVF There was question of whether adrenal insufficiency contributing.  Steroid therapy reinstituted.   Echo 03/11/2020  LVEF 55 to 60%, moderate to severe tricuspid regurgitation, moderately elevated right ventricular systolic pressure estimate 64m Hg, moderate mitral regurgitation.  RV not well visualized.    Seen in HF Clinic for first visit 10/27/20. Had NYHA IIIB symptoms. Referred for R/L cath--> normal coronaries, EF 60-65%, and normal left sided filling pressures.   R/LHC 11/27/20 Ao =129/63 (89) LV = 141/8 RA =  11 (v waves to 14) RV = 43/4 PA = 42/15 (25) PCW = 10 Fick cardiac output/index = 5.7/3.7 PVR = 2.1 WU SVR = 1101 PAPi = 2.45 WU Ao sat = 96% PA sat =  74%, 75% SVC sat = 82% MVA (LV-wedge tracing) = 3.5cm2 mean gradient across MV 3.489mG  Assessment:  1. Normal coronary arteries 2. EF 60-65% 3. Very mild PAH 4. Normal left-sided filling pressures with no significant v-waves in PCWP tracing to suggest hemodynamically significant MR 5. No significant stenosis across AoV, MV or TV  Admitted 11/22 with acute LGIB. CT scan showed acute GI hemorrhage involving mid descending colon, likely acute diverticular bleed. Transfused 3u RBCs. Colonoscopy 11/26 which noted hemorrhoids and diverticulosis, no active bleeding, recommended to restart Xarelto in 3 days if stable.   Today she returns for HF follow up with her daughter. Feels pretty good. Has good days and bad days. Walks with a cane. Unstable on her feet. Has had several falls. They are afraind to let her go out to far due t her unsteadiness.   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), moderate to severe TS (mean 10) with mild to moderate TR, mild MR/MS   Past Medical History:  Diagnosis Date   Achalasia    Acquired hypothyroidism 07123456 Acute metabolic encephalopathy 07123456 AICD (automatic cardioverter/defibrillator) present 11/14/2019   Allergic rhinitis    Aortic valve disorder 03/26/2002   Atherosclerosis of abdominal aorta 05/01/2020   Cardiomyopathy    CHB (complete heart block) 08/18/2017   CHF (congestive heart failure)    Cholelithiasis without obstruction 05/01/2020   Chronic gouty arthritis  Chronic heart failure with preserved ejection fraction 05/01/2020   Chronic kidney disease (CKD), active medical management without dialysis, stage 3 (moderate) 05/01/2020   Closed torus fracture of distal end of right radius with delayed healing 08/12/2021   Delusional thoughts     Diverticulosis of colon 05/01/2020   Epistaxis    Essential (primary) hypertension 08/18/2017   Gastroesophageal reflux disease 05/01/2020   Gastrointestinal hemorrhage    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 11/14/2019   Hypercoagulability due to atrial fibrillation 05/01/2020   Hyperlipidemia 11/14/2019   Hypocalcemia 12/14/2020   Hypoglycemia    Hypokalemia 12/14/2020   Hypomagnesemia 12/14/2020   Hypotension 12/14/2020   Idiopathic pulmonary fibrosis 05/01/2020   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   Iron deficiency anemia    Long term (current) use of anticoagulants    Malnutrition of mild degree [name]: 75% to less than 90% of standard weight)    Mild dementia 08/12/2021   Mitral and aortic incompetence 11/14/2019   Non-rheumatic atrial fibrillation 05/01/2020   Non-toxic multinodular goiter 08/20/2020   NSVT (nonsustained ventricular tachycardia) 08/18/2017   Oropharyngeal dysphagia    Osteoarthritis of hip 05/01/2020   Osteoarthritis of knee 05/01/2020   Osteopenia of neck of left femur    Pain of left hip joint 12/12/2017   Proteinuria 05/01/2020   Raynaud's disease    Recurrent falls 04/09/2021   Rheumatoid arthritis    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     albuterol (VENTOLIN HFA) 108 (90 Base) MCG/ACT inhaler Inhale 2 puffs into the lungs every 6 (six) hours as needed for wheezing or shortness of breath.     allopurinol (ZYLOPRIM) 100 MG tablet TAKE 1 TABLET BY MOUTH EVERY DAY (Patient taking differently: Take 100 mg  by mouth daily.) 90 tablet 1   amoxicillin (AMOXIL) 500 MG capsule 4 capsules by mouth 1 hour before dental procedure.     Budeson-Glycopyrrol-Formoterol (BREZTRI AEROSPHERE) 160-9-4.8 MCG/ACT AERO Inhale 2 puffs into the lungs 2 (two) times daily as needed ("for flares").     Cholecalciferol (VITAMIN D3) 25 MCG (1000 UT) CAPS Take 1,000 Units by mouth daily with lunch.     famotidine (PEPCID) 20 MG tablet Take 20 mg by mouth daily as needed for heartburn or indigestion.     ferrous sulfate 325 (65 FE) MG tablet Take 325 mg by mouth See admin instructions. Take 325 mg by mouth with breakfast and supper     furosemide (LASIX) 40 MG tablet Take 1 tablet (40 mg total) by mouth See admin instructions. Take 40 mg by mouth in the morning (scheduled) and an additional 40 mg at bedtime if swelling is present. HOLD IF SYSTOLIC READING IS 123456 OR DIASTOLIC READING IS 123XX123. 90 tablet 1   Glycerin-Hypromellose-PEG 400 (DRY EYE RELIEF DROPS OP) Place 1 drop into both eyes in the morning.     hydroxychloroquine (PLAQUENIL) 200 MG tablet Take 1 tablet (200 mg total) by mouth daily. 90 tablet 1   levothyroxine (SYNTHROID) 25 MCG tablet Take 1 tablet (25 mcg total) by mouth daily. (Patient taking differently:  Take 25 mcg by mouth daily before breakfast.) 90 tablet 3   magnesium oxide (MAG-OX) 400 MG tablet Take 1 tablet (400 mg total) by mouth daily. 90 tablet 2   metoprolol succinate (TOPROL-XL) 25 MG 24 hr tablet Take 1 tablet (25 mg total) by mouth daily. Hold dose if <110 SBP or <60 DBP (Patient taking differently: Take 25 mg by mouth See admin instructions. Take 25 mg by mouth once a day and HOLD IF systolic reading is 123456 OR diastolic reading is 123XX123) 90 tablet 3   Nintedanib (OFEV) 150 MG CAPS Take 150 mg by mouth at bedtime.     OLANZapine (ZYPREXA) 2.5 MG tablet TAKE 1 TABLET BY MOUTH EVERYDAY AT BEDTIME (Patient taking differently: Take 2.5 mg by mouth at bedtime.) 90 tablet 0   potassium chloride SA (KLOR-CON  M) 10 MEQ tablet Take 2 tablets (20 mEq total) by mouth daily. 180 tablet 1   predniSONE (DELTASONE) 5 MG tablet Take 1 tablet (5 mg total) by mouth daily with breakfast. Patient to double up dose during sick day rule 100 tablet 3   Rivaroxaban (XARELTO) 15 MG TABS tablet Take 1 tablet (15 mg total) by mouth daily with supper. 90 tablet 1   No current facility-administered medications for this encounter.    Allergies  Allergen Reactions   Other Other (See Comments)    Patient is to NOT EAT any foods with husks or tree nuts, popcorn   Propofol Shortness Of Breath and Other (See Comments)    "Caused asthma"   Flecainide Other (See Comments)    Reaction not recalled   Amiodarone Other (See Comments)    Delirium/Confusion/Psychosis   Amlodipine Other (See Comments)    "Tired and syncope"      Social History   Socioeconomic History   Marital status: Legally Separated    Spouse name: Not on file   Number of children: Not on file   Years of education: 16   Highest education level: Bachelor's degree (e.g., BA, AB, BS)  Occupational History   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  Tobacco Use   Smoking status: Never    Passive exposure: Past   Smokeless tobacco: Never  Vaping Use   Vaping Use: Never used  Substance and Sexual Activity   Alcohol use: Never   Drug use: Never   Sexual activity: Not Currently  Other Topics Concern   Not on file  Social History Narrative   Right Handed    Social Determinants of Health   Financial Resource Strain: Not on file  Food Insecurity: No Food Insecurity (12/26/2021)   Hunger Vital Sign    Worried About Running Out of Food in the Last Year: Never true    Ran Out of Food in the Last Year: Never true  Transportation Needs: No Transportation Needs (12/26/2021)   PRAPARE - Hydrologist (Medical): No    Lack of Transportation (Non-Medical): No  Physical Activity: Not on file  Stress: Not on file   Social Connections: Not on file  Intimate Partner Violence: Not At Risk (12/26/2021)   Humiliation, Afraid, Rape, and Kick questionnaire    Fear of Current or Ex-Partner: No    Emotionally Abused: No    Physically Abused: No    Sexually Abused: No      Family History  Problem Relation Age of Onset   Heart disease Mother    Hypertension Mother    Rheum arthritis Mother  Osteoarthritis Mother    Heart disease Father    Hypertension Father    Osteoarthritis Father    Heart disease Sister    Diabetes Brother    Cancer Brother     Vitals:   01/20/22 1029  BP: (!) 148/82  Pulse: 71  SpO2: 100%  Weight: 56.6 kg (124 lb 12.8 oz)     Wt Readings from Last 3 Encounters:  01/20/22 56.6 kg (124 lb 12.8 oz)  01/04/22 58.5 kg (129 lb)  12/29/21 59 kg (130 lb 1.1 oz)    PHYSICAL EXAM: General:  Elderly. Weak appearing Arrived in [facility] No resp difficulty HEENT: normal Neck: supple.6-7. Carotids 2+ bilat; no bruits. No lymphadenopathy or thryomegaly appreciated. Cor: PMI nondisplaced. Regular rate & rhythm. 2/6 AS Lungs: CTA Abdomen: soft, nontender, nondistended. No hepatosplenomegaly. No bruits or masses. Good bowel sounds. Extremities: no cyanosis, clubbing, rash, edema Neuro: alert & orientedx3, cranial nerves grossly intact. moves all 4 extremities w/o difficulty. Affect pleasant   ASSESSMENT & PLAN:  Chronic Diastolic CHF due to valvular heart disease -s/p bioprosthetic AVR/MV ring/bioprosthetic TVR 09/25/2010 -Echo 03/11/2020  LVEF 55 to 60%, moderate to severe tricuspid regurgitation, moderately elevated right ventricular systolic pressure estimate 37m Hg, moderate mitral regurgitation.  RV not well visualized. AV function not assessed. - RHC 11/22: Mild PAH. 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 Volume low. No VT. Activity level < 1hr/day - TTE shows potential progressive prosthetic valve stenosis. Ideally would proceed with TEE but given her overall debility currently, I worry the risk is higher than the benefit and that she would likely not be good candidate for further valvular procedures - I will repeat echo in 6 months.  2. Pulmonary HTN by ECHO - RHC 11/22 with mild PAH and normal PVR. (See above) - Likely due to valvular disease primarily  3. ILD -11/2018 HRCT with stable ILD compared with 2018.  2018 CT Slowly progressive basal predominant, moderately severe pulmonary fibrosis. Favor NSIP -no visible PFT's on care everywhere - Followed by Pulmoanry.   4. OSA -resolved with weight loss and cpap discontinued  5. Permanent Afib with SSS - has ICD in place due to h/o VT  - Followed by Dr. LaQuentin Ore4/2022 was 99% V pacing.   - On xarelto. Need to be careful with falls. May need to consider discontinuation of AC  6. H/o VT - ICD in palce  7. SLE -followed by Dr. RiBenjamine Molalupus felt to be under good control -continued on HCQ and she is also on '5mg'$  prednisone for secondary adrenal insufficiency  DaGlori BickersMD  2:10 PM

## 2022-03-22 ENCOUNTER — Other Ambulatory Visit (HOSPITAL_COMMUNITY): Payer: Self-pay

## 2022-03-22 DIAGNOSIS — I5032 Chronic diastolic (congestive) heart failure: Secondary | ICD-10-CM

## 2022-03-25 ENCOUNTER — Other Ambulatory Visit: Payer: Self-pay | Admitting: Internal Medicine

## 2022-03-25 ENCOUNTER — Ambulatory Visit
Admission: RE | Admit: 2022-03-25 | Discharge: 2022-03-25 | Disposition: A | Discharge status: Home / Self Care | Payer: Medicare HMO | Source: Ambulatory Visit | Attending: Internal Medicine | Admitting: Internal Medicine

## 2022-03-25 DIAGNOSIS — S82899A Other fracture of unspecified lower leg, initial encounter for closed fracture: Secondary | ICD-10-CM

## 2022-03-25 DIAGNOSIS — M25571 Pain in right ankle and joints of right foot: Secondary | ICD-10-CM

## 2022-03-25 HISTORY — DX: Other fracture of unspecified lower leg, initial encounter for closed fracture: S82.899A

## 2022-03-27 ENCOUNTER — Other Ambulatory Visit: Payer: Self-pay | Admitting: Internal Medicine

## 2022-03-27 DIAGNOSIS — M329 Systemic lupus erythematosus, unspecified: Secondary | ICD-10-CM

## 2022-03-29 NOTE — Telephone Encounter (Signed)
Next Visit: 04/29/2022  Last Visit: 10/26/2021  Labs: 01/01/2022 GFR 49, Potassium 3.3, BUN 36, RBC 3.27, Hgb 10.6, Hct 32.6, MCV 99.8   Eye exam: 02/19/2022    Current Dose per office note 10/26/2021: HCQ 200 mg PO daily   IN:[id number] lupus erythematosus, unspecified SLE type   Last Fill: 04/10/2021  Okay to refill Plaquenil?

## 2022-03-31 ENCOUNTER — Ambulatory Visit (INDEPENDENT_AMBULATORY_CARE_PROVIDER_SITE_OTHER): Payer: Medicare HMO

## 2022-03-31 ENCOUNTER — Encounter: Payer: Self-pay | Admitting: Surgical

## 2022-03-31 ENCOUNTER — Ambulatory Visit (INDEPENDENT_AMBULATORY_CARE_PROVIDER_SITE_OTHER): Payer: Medicare HMO | Admitting: Surgical

## 2022-03-31 DIAGNOSIS — M25571 Pain in right ankle and joints of right foot: Secondary | ICD-10-CM

## 2022-04-02 ENCOUNTER — Encounter: Payer: Self-pay | Admitting: Surgical

## 2022-04-02 ENCOUNTER — Other Ambulatory Visit: Payer: Self-pay | Admitting: Internal Medicine

## 2022-04-02 DIAGNOSIS — M85852 Other specified disorders of bone density and structure, left thigh: Secondary | ICD-10-CM

## 2022-04-02 NOTE — Progress Notes (Cosign Needed Addendum)
Office Visit Note   Patient: Lindsey Huber           Date of Birth: 1946-01-03           MRN: [id number] Visit Date: 03/31/2022 Requested by: Scheryl Marten, [place] [place],  [place] [address] PCP: Sabra Heck, [place], [name]  Subjective: Chief Complaint  Patient presents with   Right Ankle - Pain    HPI: Lindsey Huber is a 77 y.o. female who presents to the office reporting right ankle pain.  Patient fell, injuring her right ankle on 03/16/2022.  She was seen by her PCP, Fredda Hammed, PA-C who was concerned for ankle fracture and ordered x-ray demonstrating lucency concerning for nondisplaced lateral malleolus fracture.  She was referred to this practice.  She has been ambulatory without ankle brace or boot.  Taking Tylenol for pain which provides some relief.  She does take Xarelto so she cannot take NSAIDs.  She describes primarily lateral pain but she does have some medial ankle pain as well.  She has no history of prior issue with her ankle.  No history of prior ankle surgery.  She takes vitamin D 1000 units every day.  She does not smoke..                ROS: All systems reviewed are negative as they relate to the chief complaint within the history of present illness.  Patient denies fevers or chills.  Assessment & Plan: Visit Diagnoses:  1. Acute right ankle pain     Plan: Patient is a 77 year old female who presents for evaluation of right ankle pain.  She has history of fall about 2 weeks ago and has been ambulatory since then.  Taking Tylenol for pain.  She is on Xarelto and has fairly extensive medical history.  Most of her pain is in the lateral aspect of the ankle with radiographs today demonstrating minimal displacement of the lateral malleolus fracture compared with prior radiographs.  Given the fracture pattern as well as the fact that she has been ambulatory, this seems to be a fairly stable fracture.  Will place her in fracture boot and keep  her nonweightbearing with follow-up in 2 weeks for clinical recheck with new x-rays at that time.  Recommended she increase her vitamin D daily dosage from 1000 to 2000 units daily.  She and her daughter agreed with plan.  Call with any concerns in the meantime.  Follow-Up Instructions: Return in about 2 weeks (around 04/14/2022), or with Dr. Marlou Sa.   Orders:  Orders Placed This Encounter  Procedures   XR Ankle Complete Right   No orders of the defined types were placed in this encounter.     Procedures: No procedures performed   Clinical Data: No additional findings.  Objective: Vital Signs: There were no vitals taken for this visit.  Physical Exam:  Constitutional: Patient appears well-developed HEENT:  Head: Normocephalic Eyes:EOM are normal Neck: Normal range of motion Cardiovascular: Normal rate Pulmonary/chest: Effort normal Neurologic: Patient is alert Skin: Skin is warm Psychiatric: Patient has normal mood and affect  Ortho Exam: Ortho exam demonstrates right ankle with palpable DP pulse rated 1+.  She has intact ankle dorsiflexion, plantarflexion, inversion, eversion.  She has tenderness primarily over the lateral malleolus with some mild tenderness over the medial malleolus.  No tenderness over the fifth metatarsal base or Lisfranc complex.  No plantar ecchymosis noted.  She does have little bit of tenderness through the  base of the first and second toes.  There is a little bit of ecchymosis in this region as well.  No tenderness around the proximal fibula or throughout the knee or proximal tibia.  No knee effusion noted.  No skin injury or concern for open fracture.  No calf tenderness.  Negative Homans' sign.  Specialty Comments:  No specialty comments available.  Imaging: No results found.   PMFS History: Patient Active Problem List   Diagnosis Date Noted   Symptomatic anemia 12/26/2021   Mild dementia 08/12/2021   Allergic rhinitis    Chronic gouty  arthritis    Epistaxis    Hypoglycemia    Iron deficiency anemia    Long term (current) use of anticoagulants    Malnutrition of mild degree [name]: 75% to less than 90% of standard weight)    Oropharyngeal dysphagia    Osteopenia of neck of left femur    Raynaud's disease    Rheumatoid arthritis    Transient ischemic attack    Slow transit constipation    Thrombophilia    Achalasia    Delusional thoughts    Recurrent falls 04/09/2021   Hypotension 12/14/2020   Hypokalemia 12/14/2020   Hypocalcemia 12/14/2020   Hypomagnesemia 12/14/2020   Non-toxic multinodular goiter 08/20/2020   Acquired hypothyroidism 08/20/2020   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   Vitamin D deficiency 05/01/2020   Proteinuria 05/01/2020   Osteoarthritis of knee 05/01/2020   Osteoarthritis of hip 05/01/2020   Gastroesophageal reflux disease 05/01/2020   Idiopathic pulmonary fibrosis 05/01/2020   Cardiomyopathy 05/01/2020   Hypercoagulability due to atrial fibrillation 05/01/2020   Systemic lupus erythematosus 05/01/2020   Chronic heart failure with preserved ejection fraction 05/01/2020   Chronic kidney disease (CKD), active medical management without dialysis, stage 3 (moderate) 05/01/2020   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   Non-rheumatic atrial fibrillation 05/01/2020   Atherosclerosis of abdominal aorta 05/01/2020   Diverticulosis of colon 05/01/2020   Cholelithiasis without obstruction 05/01/2020   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   AICD (automatic cardioverter/defibrillator) present 11/14/2019   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 11/14/2019   Mitral and aortic incompetence 11/14/2019   Hyperlipidemia 11/14/2019   Pain of left hip joint 12/12/2017   Essential (primary) hypertension 08/18/2017   CHB (complete heart block) 08/18/2017   NSVT (nonsustained ventricular tachycardia) 08/18/2017   Aortic valve disorder  03/26/2002   Past Medical History:  Diagnosis Date   Achalasia    Acquired hypothyroidism 123456   Acute metabolic encephalopathy 123456   AICD (automatic cardioverter/defibrillator) present 11/14/2019   Allergic rhinitis    Aortic valve disorder 03/26/2002   Atherosclerosis of abdominal aorta 05/01/2020   Cardiomyopathy    CHB (complete heart block) 08/18/2017   CHF (congestive heart failure)    Cholelithiasis without obstruction 05/01/2020   Chronic gouty arthritis    Chronic heart failure with preserved ejection fraction 05/01/2020   Chronic kidney disease (CKD), active medical management without dialysis, stage 3 (moderate) 05/01/2020   Closed torus fracture of distal end of right radius with delayed healing 08/12/2021   Delusional thoughts    Diverticulosis of colon 05/01/2020   Epistaxis    Essential (primary) hypertension 08/18/2017   Gastroesophageal reflux disease 05/01/2020   Gastrointestinal hemorrhage    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 11/14/2019   Hypercoagulability due to atrial fibrillation 05/01/2020   Hyperlipidemia 11/14/2019   Hypocalcemia 12/14/2020   Hypoglycemia    Hypokalemia 12/14/2020  Hypomagnesemia 12/14/2020   Hypotension 12/14/2020   Idiopathic pulmonary fibrosis 05/01/2020   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   Iron deficiency anemia    Long term (current) use of anticoagulants    Malnutrition of mild degree [name]: 75% to less than 90% of standard weight)    Mild dementia 08/12/2021   Mitral and aortic incompetence 11/14/2019   Non-rheumatic atrial fibrillation 05/01/2020   Non-toxic multinodular goiter 08/20/2020   NSVT (nonsustained ventricular tachycardia) 08/18/2017   Oropharyngeal dysphagia    Osteoarthritis of hip 05/01/2020   Osteoarthritis of knee 05/01/2020   Osteopenia of neck of left femur    Pain of left hip joint 12/12/2017   Proteinuria 05/01/2020   Raynaud's disease    Recurrent  falls 04/09/2021   Rheumatoid arthritis    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   Septic shock 03/10/2020   Slow transit constipation    Systemic lupus erythematosus 05/01/2020   Thrombophilia    Transient ischemic attack    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   Vitamin D deficiency     Family History  Problem Relation Age of Onset   Heart disease Mother    Hypertension Mother    Rheum arthritis Mother    Osteoarthritis Mother    Heart disease Father    Hypertension Father    Osteoarthritis Father    Heart disease Sister    Diabetes Brother    Cancer Brother     Past Surgical History:  Procedure Laterality Date   BREAST BIOPSY Left    CARDIAC VALVE SURGERY     CARPAL TUNNEL RELEASE     COLONOSCOPY WITH PROPOFOL N/A 12/20/2020   Procedure: COLONOSCOPY WITH PROPOFOL;  Surgeon: Arta Silence, MD;  Location: [facility] ENDOSCOPY;  Service: Endoscopy;  Laterality: N/A;   HEMORRHOID SURGERY     PACEMAKER INSERTION     RIGHT/LEFT HEART CATH AND CORONARY ANGIOGRAPHY N/A 11/27/2020   Procedure: RIGHT/LEFT HEART CATH AND CORONARY ANGIOGRAPHY;  Surgeon: Jolaine Artist, MD;  Location: [facility];  Service: Cardiovascular;  Laterality: N/A;   TONSILLECTOMY     Social History   Occupational History   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  Tobacco Use   Smoking status: Never    Passive exposure: Past   Smokeless tobacco: Never  Vaping Use   Vaping Use: Never used  Substance and Sexual Activity   Alcohol use: Never   Drug use: Never   Sexual activity: Not Currently

## 2022-04-17 ENCOUNTER — Other Ambulatory Visit (HOSPITAL_COMMUNITY): Payer: Self-pay | Admitting: Internal Medicine

## 2022-04-19 ENCOUNTER — Other Ambulatory Visit: Payer: Self-pay

## 2022-04-19 ENCOUNTER — Ambulatory Visit (INDEPENDENT_AMBULATORY_CARE_PROVIDER_SITE_OTHER): Payer: Medicare HMO | Admitting: Orthopedic Surgery

## 2022-04-19 DIAGNOSIS — M25571 Pain in right ankle and joints of right foot: Secondary | ICD-10-CM

## 2022-04-21 IMAGING — CT CT ANGIO NECK
1 of 8 series · 6 of 33 positions shown · IV contrast (Omnipaque)
Comparison: Prior study from earlier the same day.

CLINICAL DATA: Initial evaluation for acute stroke, slurred speech.

EXAM:
CT ANGIOGRAPHY HEAD AND NECK
TECHNIQUE: Multidetector CT imaging of the head and neck was performed using
the standard protocol during bolus administration of intravenous
contrast. Multiplanar CT image reconstructions and MIPs were
obtained to evaluate the vascular anatomy. Carotid stenosis
measurements (when applicable) are obtained utilizing NASCET
criteria, using the distal internal carotid diameter as the
denominator.
CONTRAST:  75mL OMNIPAQUE IOHEXOL 350 MG/ML SOLN

[Series 9: axial thin · axial · 0.39mm/px · z∈[-316,-88]mm · 6 of 321 slices shown]
[im 46/321  soft-tissue]
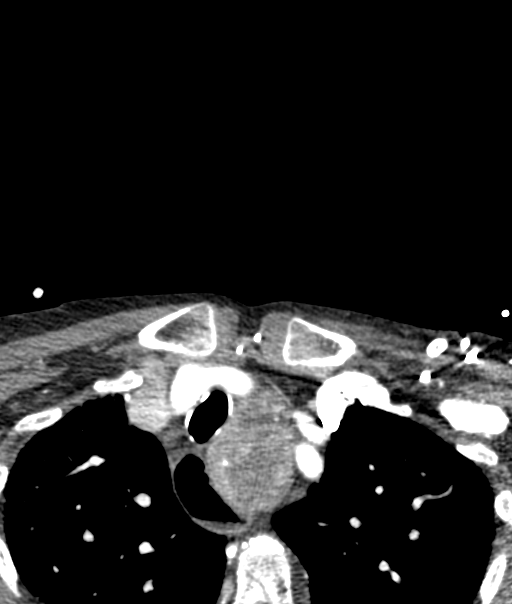
[im 92/321  bone]
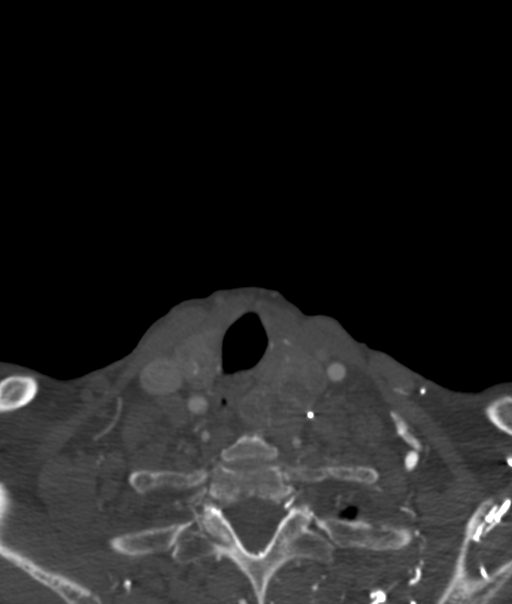
[im 138/321  soft-tissue]
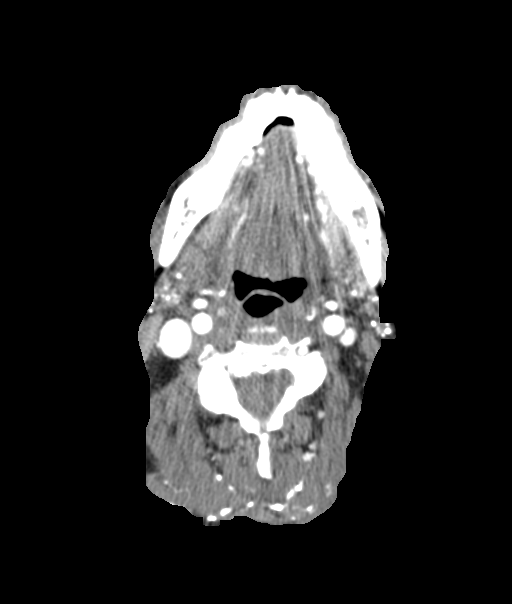
[im 183/321  bone]
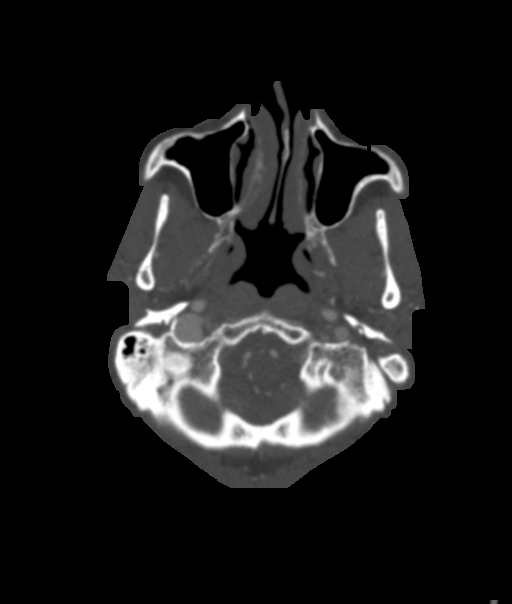
[im 229/321  soft-tissue]
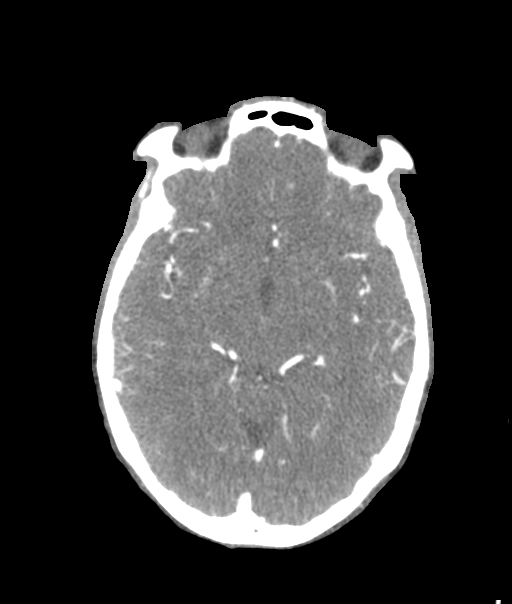
[im 275/321  bone]
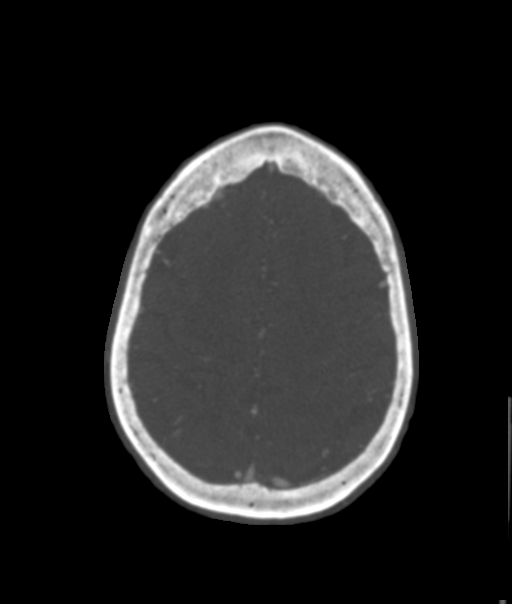

[6 of 33 positions shown; findings below may reference images not displayed]

FINDINGS: CTA NECK FINDINGS

Aortic arch: Visualized aortic arch normal in caliber with normal
branch pattern. Mild-to-moderate atheromatous change about the arch
and origin of the great vessels without hemodynamically significant
stenosis.

Right carotid system: Right CCA patent from its origin to the
bifurcation without stenosis. Mild atheromatous change about the
right carotid bulb/proximal right ICA without significant stenosis.
Right ICA patent distally without stenosis, dissection or occlusion.

Left carotid system: Left CCA patent from its origin to the
bifurcation without stenosis. Mild atheromatous change about the
left carotid bulb/proximal left ICA without significant stenosis.
Left ICA patent distally without stenosis, dissection or occlusion.

Vertebral arteries: Both vertebral arteries arise from the
subclavian arteries. No proximal subclavian artery stenosis.
Vertebral arteries mildly tortuous but are widely patent without
stenosis, dissection or occlusion.

Skeleton: No visible acute osseous finding. No discrete or worrisome
osseous lesions. Moderate spondylosis noted at C5-6 and C6-7.

Other neck: No other acute soft tissue abnormality within the neck.
Salivary glands are atrophic with innumerable sialoliths
bilaterally. Enlarged multinodular goiter with substernal extension.
Dominant left thyroid nodule measures up to approximately 5.7 cm.
Mass effect on the subglottic trachea which is mildly narrowed and
deviated to the right.

Upper chest: Enlarged precarinal lymph node measures up to 2.7 cm.
Prevascular node measures 1.5 cm. Scattered subpleural reticular
densities seen within the partially visualized lungs, nonspecific,
but could reflect fibrosis and/or infection. Visualized esophagus is
dilated and patulous with air-fluid level within the esophageal
lumen.

Review of the MIP images confirms the above findings

CTA HEAD FINDINGS

Anterior circulation: Petrous segments patent bilaterally. Scattered
atheromatous change within the carotid siphons with associated mild
multifocal narrowing. A1 segments patent bilaterally. Normal
anterior communicating artery complex. Anterior cerebral arteries
patent to their distal aspects without stenosis. No M1 stenosis or
occlusion. Normal MCA bifurcations. Distal MCA branches well
perfused and symmetric.

Posterior circulation: Both V4 segments patent to the
vertebrobasilar junction without stenosis. Both PICA origins patent
and normal. Basilar patent to its distal aspect without stenosis.
Superior cerebellar arteries patent bilaterally. Both PCAs supplied
via the basilar and prominent bilateral posterior communicating
arteries. PCAs well perfused to their distal aspects without
stenosis.

Venous sinuses: Grossly patent allowing for timing the contrast
bolus.

Anatomic variants: None significant.  No aneurysm.

Review of the MIP images confirms the above findings
IMPRESSION: 1. Negative CTA for large vessel occlusion.
2. Mild for age atheromatous change about the carotid bifurcations
and carotid siphons without hemodynamically significant stenosis.
3. Enlarged multinodular goiter with dominant approximate 5.7 cm
nodule on the left. Further evaluation with dedicated thyroid
ultrasound recommended. This could be performed on a nonemergent
outpatient basis. (Ref: [HOSPITAL]. [DATE]): 143-50).
4. Scattered subpleural reticular densities within the partially
visualized lungs, nonspecific, but could reflect fibrosis and/or
infection. Correlation with plain film radiography suggested as
warranted.
5. Enlarged mediastinal adenopathy as above, indeterminate, but
could be reactive.
6. Atrophic appearance of the salivary glands bilaterally with
innumerable intraglandular calcifications. Findings are nonspecific,
but can be seen in the setting of Sjogren's syndrome

## 2022-04-25 ENCOUNTER — Encounter: Payer: Self-pay | Admitting: Orthopedic Surgery

## 2022-04-25 NOTE — Progress Notes (Signed)
Office Visit Note   Patient: Lindsey Huber           Date of Birth: 01-Oct-1945           MRN: [id number] Visit Date: 04/19/2022 Requested by: Scheryl Marten, [address] [place],  [place] [id number] PCP: Sabra Heck, [place], [name]  Subjective: Chief Complaint  Patient presents with   Right Ankle - Follow-up, Fracture    DOI: 03/16/22    HPI: Lindsey Huber is a 77 y.o. female who presents to the office reporting right ankle pain.  Date of injury 03/17/2019.  She has been nonweightbearing in a cam boot.  That was initially.  Since that time she has progressed to full weightbearing with the boot and a cane.  Taking Tylenol with relief..                ROS: All systems reviewed are negative as they relate to the chief complaint within the history of present illness.  Patient denies fevers or chills.  Assessment & Plan: Visit Diagnoses:  1. Acute right ankle pain     Plan: Impression is healing right lateral malleolus fracture.  Fracture boot should be used for about 1 more week then she can discontinue the boot in a week.  Boots not required at night.  Should be able to transition to regular shoes at that time.  Follow-up as needed.  Follow-Up Instructions: No follow-ups on file.   Orders:  Orders Placed This Encounter  Procedures   XR Ankle Complete Right   No orders of the defined types were placed in this encounter.     Procedures: No procedures performed   Clinical Data: No additional findings.  Objective: Vital Signs: There were no vitals taken for this visit.  Physical Exam:  Constitutional: Patient appears well-developed HEENT:  Head: Normocephalic Eyes:EOM are normal Neck: Normal range of motion Cardiovascular: Normal rate Pulmonary/chest: Effort normal Neurologic: Patient is alert Skin: Skin is warm Psychiatric: Patient has normal mood and affect  Ortho Exam: Ortho exam demonstrates minimal swelling and tenderness over the right  lateral malleolus.  Ankle dorsiflexion plantarflexion inversion eversion strength is intact.  No medial sided tenderness.  Syndesmosis stable.  No calf tenderness negative Homans.  Specialty Comments:  No specialty comments available.  Imaging: No results found.   PMFS History: Patient Active Problem List   Diagnosis Date Noted   Symptomatic anemia 12/26/2021   Mild dementia 08/12/2021   Allergic rhinitis    Chronic gouty arthritis    Epistaxis    Hypoglycemia    Iron deficiency anemia    Long term (current) use of anticoagulants    Malnutrition of mild degree [name]: 75% to less than 90% of standard weight)    Oropharyngeal dysphagia    Osteopenia of neck of left femur    Raynaud's disease    Rheumatoid arthritis    Transient ischemic attack    Slow transit constipation    Thrombophilia    Achalasia    Delusional thoughts    Recurrent falls 04/09/2021   Hypotension 12/14/2020   Hypokalemia 12/14/2020   Hypocalcemia 12/14/2020   Hypomagnesemia 12/14/2020   Non-toxic multinodular goiter 08/20/2020   Acquired hypothyroidism 08/20/2020   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   Vitamin D deficiency 05/01/2020   Proteinuria 05/01/2020   Osteoarthritis of knee 05/01/2020   Osteoarthritis of hip 05/01/2020   Gastroesophageal reflux disease 05/01/2020   Idiopathic pulmonary fibrosis 05/01/2020   Cardiomyopathy 05/01/2020   Hypercoagulability  due to atrial fibrillation 05/01/2020   Systemic lupus erythematosus 05/01/2020   Chronic heart failure with preserved ejection fraction 05/01/2020   Chronic kidney disease (CKD), active medical management without dialysis, stage 3 (moderate) 05/01/2020   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   Non-rheumatic atrial fibrillation 05/01/2020   Atherosclerosis of abdominal aorta 05/01/2020   Diverticulosis of colon 05/01/2020   Cholelithiasis without obstruction 05/01/2020   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   AICD (automatic cardioverter/defibrillator) present 11/14/2019   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 11/14/2019   Mitral and aortic incompetence 11/14/2019   Hyperlipidemia 11/14/2019   Pain of left hip joint 12/12/2017   Essential (primary) hypertension 08/18/2017   CHB (complete heart block) 08/18/2017   NSVT (nonsustained ventricular tachycardia) 08/18/2017   Aortic valve disorder 03/26/2002   Past Medical History:  Diagnosis Date   Achalasia    Acquired hypothyroidism 123456   Acute metabolic encephalopathy 123456   AICD (automatic cardioverter/defibrillator) present 11/14/2019   Allergic rhinitis    Aortic valve disorder 03/26/2002   Atherosclerosis of abdominal aorta 05/01/2020   Cardiomyopathy    CHB (complete heart block) 08/18/2017   CHF (congestive heart failure)    Cholelithiasis without obstruction 05/01/2020   Chronic gouty arthritis    Chronic heart failure with preserved ejection fraction 05/01/2020   Chronic kidney disease (CKD), active medical management without dialysis, stage 3 (moderate) 05/01/2020   Closed torus fracture of distal end of right radius with delayed healing 08/12/2021   Delusional thoughts    Diverticulosis of colon 05/01/2020   Epistaxis    Essential (primary) hypertension 08/18/2017   Gastroesophageal reflux disease 05/01/2020   Gastrointestinal hemorrhage    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 11/14/2019   Hypercoagulability due to atrial fibrillation 05/01/2020   Hyperlipidemia 11/14/2019   Hypocalcemia 12/14/2020   Hypoglycemia    Hypokalemia 12/14/2020   Hypomagnesemia 12/14/2020   Hypotension 12/14/2020   Idiopathic pulmonary fibrosis 05/01/2020   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   Iron deficiency anemia    Long term (current) use of anticoagulants    Malnutrition of mild degree [name]: 75% to less than 90% of standard weight)    Mild dementia  08/12/2021   Mitral and aortic incompetence 11/14/2019   Non-rheumatic atrial fibrillation 05/01/2020   Non-toxic multinodular goiter 08/20/2020   NSVT (nonsustained ventricular tachycardia) 08/18/2017   Oropharyngeal dysphagia    Osteoarthritis of hip 05/01/2020   Osteoarthritis of knee 05/01/2020   Osteopenia of neck of left femur    Pain of left hip joint 12/12/2017   Proteinuria 05/01/2020   Raynaud's disease    Recurrent falls 04/09/2021   Rheumatoid arthritis    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   Septic shock 03/10/2020   Slow transit constipation    Systemic lupus erythematosus 05/01/2020   Thrombophilia    Transient ischemic attack    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   Vitamin D deficiency     Family History  Problem Relation Age of Onset   Heart disease Mother    Hypertension Mother    Rheum arthritis Mother    Osteoarthritis Mother    Heart disease Father    Hypertension Father    Osteoarthritis Father    Heart disease Sister    Diabetes Brother    Cancer Brother     Past Surgical History:  Procedure Laterality Date   BREAST BIOPSY Left    CARDIAC VALVE SURGERY  CARPAL TUNNEL RELEASE     COLONOSCOPY WITH PROPOFOL N/A 12/20/2020   Procedure: COLONOSCOPY WITH PROPOFOL;  Surgeon: Arta Silence, MD;  Location: [facility];  Service: Endoscopy;  Laterality: N/A;   HEMORRHOID SURGERY     PACEMAKER INSERTION     RIGHT/LEFT HEART CATH AND CORONARY ANGIOGRAPHY N/A 11/27/2020   Procedure: RIGHT/LEFT HEART CATH AND CORONARY ANGIOGRAPHY;  Surgeon: Jolaine Artist, MD;  Location: [facility];  Service: Cardiovascular;  Laterality: N/A;   TONSILLECTOMY     Social History   Occupational History   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

## 2022-04-29 ENCOUNTER — Ambulatory Visit: Payer: Medicare HMO | Admitting: Internal Medicine

## 2022-05-02 NOTE — Progress Notes (Signed)
Office Visit Note  Patient: Lindsey Huber             Date of Birth: 12-21-1945           MRN: [id number]             PCP: Collene Mares, PA Referring: Collene Mares, [name] Visit Date: 05/03/2022   Subjective:  Follow-up   History of Present Illness: Lindsey Huber is a 77 y.o. female here for follow up for  SLE with related ILD, OA, and gouty arthritis on hydroxychloroquine 200 mg daily and prednisone 5 mg daily and allopurinol 100 mg daily.  She had follow-up earlier today with [name] managing with Durel Salts currently tolerating the medication well.  Still having some joint pains frequently bothers her at the ankle and feet not seeing a lot of swelling in upper extremities.  Still gets Raynaud's symptoms without any associated lesions.   Previous HPI 10/26/21 Lindsey Huber is a 77 y.o. female here for follow up for SLE, OA, and gouty arthritis on HCQ 200 mg PO daily and prednisone 5 mg daily and allopurinol 100 mg daily. She has not suffered any flare up of joint pain and swelling since our last visit. No new falls. She has morning stiffness increases somewhat with colder temperatures but keeps home pretty warm. Fingertips turn blue sometimes with cold but no pallor, redness, no skin peeling or lesions.   Previous HPI 07/16/2021 Lindsey Huber is a 77 y.o. female here for follow up for SLE, OA, and gouty arthritis on HCQ 200 mg daily and prednisone 5 mg and allopurinol 100 mg daily. No major flare up with lupus symptoms no joint effusion. She has persistent mild pedal edema. She fell again in May struck her right temple area on she side of the car and also chest and shoulder pain. No major complications identified. She has not yet started with PT for her weakness, pain, and gait difficulty.    Previous HPI 04/09/21 Lindsey Huber is a 77 y.o. female here for follow up for systemic lupus and gout on HCQ 200 mg daily and allopurinol 100 mg daily. She takes prednisone 5  mg daily as well for adrenal insufficiency. She has had multiple falls since last visit most recently about a week ago. This is most often from her leg typically right knee suddenly giving way without preceding signs or symptoms. She fell onto her right hip. Also fell recently landing on left elbow with residual bruising around that area. No new gout attacks or joint swelling besides from the bruising.   Previous HPI 01/05/21 Lindsey Huber is a 77 y.o. female here for follow up for SLE with inflammatory arthritis and gout on hydroxychloroquine 200 mg daily and allopurinol 100 mg. 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  Since our last visit she had a significant event with hospitalization for hematochezia work-up identified a diverticular bleed.  She did have sufficient anemia required blood transfusion otherwise no major complications.  She has had no major change in pulmonary symptoms no new skin rashes or swelling.  Does have mild pedal edema.  Currently she is experiencing some increased knee pain and stiffness.   Previous HPI: 06/27/20 Lindsey Huber is a 77 y.o. female here to establish care for systemic lupus and gouty arthritis currently on hydroxychloroquine 200 mg p.o. daily and allopurinol 100 mg p.o. daily.  She was originally diagnosed due to symptoms of multiple joint pains  with orthopedic surgery evaluation in [place] with additional work-up revealing for systemic lupus.  She has previously taken steroids and was on low-dose prednisone 5 mg daily for quite some time although discontinued at her most recent hospitalization.  She has had episodic gout flares involving both feet.  Lupus symptoms have been predominantly arthritis possibly a overlap with rheumatoid arthritis with no known history of lupus nephritis.  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  She has also had intra-articular  steroid injection of the knees for arthritis symptoms with reasonably good benefit.  Her left knee has had some frequent slipping or instability reported she describes a fall last week despite routine use of a walker and sometimes knee brace for this.   Review of Systems  Constitutional:  Positive for fatigue.  HENT:  Positive for mouth dryness. Negative for mouth sores.   Eyes:  Positive for dryness.  Respiratory:  Positive for shortness of breath.   Cardiovascular:  Positive for chest pain and palpitations.  Gastrointestinal:  Positive for constipation and diarrhea. Negative for blood in stool.  Endocrine: Positive for increased urination.  Genitourinary:  Positive for involuntary urination.  Musculoskeletal:  Positive for joint pain, gait problem, joint pain, joint swelling, myalgias, morning stiffness and myalgias. Negative for muscle weakness and muscle tenderness.  Skin:  Positive for color change and hair loss. Negative for rash and sensitivity to sunlight.  Allergic/Immunologic: Negative for susceptible to infections.  Neurological:  Positive for dizziness and headaches.  Hematological:  Negative for swollen glands.  Psychiatric/Behavioral:  Positive for sleep disturbance. Negative for depressed mood. The patient is not nervous/anxious.     PMFS History:  Patient Active Problem List   Diagnosis Date Noted   Symptomatic anemia 12/26/2021   Mild dementia 08/12/2021   Allergic rhinitis    Chronic gouty arthritis    Epistaxis    Hypoglycemia    Iron deficiency anemia    Long term (current) use of anticoagulants    Malnutrition of mild degree [name]: 75% to less than 90% of standard weight)    Oropharyngeal dysphagia    Osteopenia of neck of left femur    Raynaud's disease    Rheumatoid arthritis    Transient ischemic attack    Slow transit constipation    Thrombophilia    Achalasia    Delusional thoughts    Recurrent falls 04/09/2021   Hypotension 12/14/2020    Hypokalemia 12/14/2020   Hypocalcemia 12/14/2020   Hypomagnesemia 12/14/2020   Non-toxic multinodular goiter 08/20/2020   Acquired hypothyroidism 08/20/2020   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   Vitamin D deficiency 05/01/2020   Proteinuria 05/01/2020   Osteoarthritis of knee 05/01/2020   Osteoarthritis of hip 05/01/2020   Gastroesophageal reflux disease 05/01/2020   Idiopathic pulmonary fibrosis 05/01/2020   Cardiomyopathy 05/01/2020   Hypercoagulability due to atrial fibrillation 05/01/2020   Systemic lupus erythematosus 05/01/2020   Chronic heart failure with preserved ejection fraction 05/01/2020   Chronic kidney disease (CKD), active medical management without dialysis, stage 3 (moderate) 05/01/2020   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   Non-rheumatic atrial fibrillation 05/01/2020   Atherosclerosis of abdominal aorta 05/01/2020   Diverticulosis of colon 05/01/2020   Cholelithiasis without obstruction 05/01/2020   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   AICD (automatic cardioverter/defibrillator) present 11/14/2019   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 11/14/2019   Mitral and aortic incompetence 11/14/2019   Hyperlipidemia 11/14/2019   Pain  of left hip joint 12/12/2017   Essential (primary) hypertension 08/18/2017   CHB (complete heart block) 08/18/2017   NSVT (nonsustained ventricular tachycardia) 08/18/2017   Aortic valve disorder 03/26/2002    Past Medical History:  Diagnosis Date   Achalasia    Acquired hypothyroidism 08/20/2020   Acute metabolic encephalopathy 07/25/2021   AICD (automatic cardioverter/defibrillator) present 11/14/2019   Allergic rhinitis    Ankle fracture 03/25/2022   Aortic valve disorder 03/26/2002   Atherosclerosis of abdominal aorta (HCC) 05/01/2020   Cardiomyopathy (HCC)    CHB (complete heart block) (HCC) 08/18/2017   CHF (congestive heart failure) (HCC)     Cholelithiasis without obstruction 05/01/2020   Chronic gouty arthritis    Chronic heart failure with preserved ejection fraction (HCC) 05/01/2020   Chronic kidney disease (CKD), active medical management without dialysis, stage 3 (moderate) (HCC) 05/01/2020   Closed torus fracture of distal end of right radius with delayed healing 08/12/2021   Delusional thoughts (HCC)    Diverticulosis of colon 05/01/2020   Epistaxis    Essential (primary) hypertension 08/18/2017   Gastroesophageal reflux disease 05/01/2020   Gastrointestinal hemorrhage    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 11/14/2019   Hypercoagulability due to atrial fibrillation (HCC) 05/01/2020   Hyperlipidemia 11/14/2019   Hypocalcemia 12/14/2020   Hypoglycemia    Hypokalemia 12/14/2020   Hypomagnesemia 12/14/2020   Hypotension 12/14/2020   Idiopathic pulmonary fibrosis (HCC) 05/01/2020   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   Iron deficiency anemia    Long term (current) use of anticoagulants    Malnutrition of mild degree [name]: 75% to less than 90% of standard weight) (HCC)    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/12/2021   Mitral and aortic incompetence 11/14/2019   Non-rheumatic atrial fibrillation (HCC) 05/01/2020   Non-toxic multinodular goiter 08/20/2020   NSVT (nonsustained ventricular tachycardia) (HCC) 08/18/2017   Oropharyngeal dysphagia    Osteoarthritis of hip 05/01/2020   Osteoarthritis of knee 05/01/2020   Osteopenia of neck of left femur    Pain of left hip joint 12/12/2017   Proteinuria 05/01/2020   Raynaud's disease    Recurrent falls 04/09/2021   Rheumatoid arthritis (HCC)    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   Septic shock (HCC) 03/10/2020   Slow transit constipation    Systemic lupus erythematosus (HCC) 05/01/2020   Thrombophilia (HCC)    Transient ischemic attack    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   Vitamin D  deficiency     Family History  Problem Relation Age of Onset   Heart disease Mother    Hypertension Mother    Rheum arthritis Mother    Osteoarthritis Mother    Heart disease Father    Hypertension Father    Osteoarthritis Father    Heart disease Sister    Diabetes Brother    Cancer Brother    Past Surgical History:  Procedure Laterality Date   BREAST BIOPSY Left    CARDIAC VALVE SURGERY     CARPAL TUNNEL RELEASE     COLONOSCOPY WITH PROPOFOL N/A 12/20/2020   Procedure: COLONOSCOPY WITH PROPOFOL;  Surgeon: Willis Modena, MD;  Location: [facility] ENDOSCOPY;  Service: Endoscopy;  Laterality: N/A;   HEMORRHOID SURGERY     PACEMAKER INSERTION     RIGHT/LEFT HEART CATH AND CORONARY ANGIOGRAPHY N/A 11/27/2020   Procedure: RIGHT/LEFT HEART CATH AND CORONARY ANGIOGRAPHY;  Surgeon: Dolores Patty, MD;  Location: [facility] INVASIVE CV LAB;  Service: Cardiovascular;  Laterality: N/A;  TONSILLECTOMY     Social History   Social History Narrative   Right Handed    Immunization History  Administered Date(s) Administered   Influenza-Unspecified 10/26/2019, 10/07/2021   PFIZER(Purple Top)SARS-COV-2 Vaccination 04/17/2019, 05/08/2019, 11/24/2019     Objective: Vital Signs: BP 133/76 (BP Location: Left Arm, Patient Position: Sitting, Cuff Size: Normal)   Pulse 70   Resp 14   Ht 5\' 3"  (1.6 m)   Wt 124 lb (56.2 kg)   BMI 21.97 kg/m    Physical Exam Eyes:     Conjunctiva/sclera: Conjunctivae normal.  Cardiovascular:     Rate and Rhythm: Normal rate and regular rhythm.  Pulmonary:     Effort: Pulmonary effort is normal.     Breath sounds: Normal breath sounds.  Lymphadenopathy:     Cervical: No cervical adenopathy.  Skin:    General: Skin is warm and dry.     Comments: Hyperpigmentation on anterior of distal shins  Neurological:     Mental Status: She is alert.  Psychiatric:        Mood and Affect: Mood normal.      Musculoskeletal Exam:  Neck full ROM no tenderness Shoulders  full ROM no tenderness or swelling Elbows full ROM no tenderness or swelling Wrists personally decreased flexion extension range of motion, no palpable swelling Fingers full ROM bilateral first CMC joint squaring and MCP bony widening or bossing Knees full ROM there is mild medial joint line tenderness to palpation on left side, patellofemoral crepitus   Investigation: No additional findings.  Imaging: CUP PACEART REMOTE DEVICE CHECK  Result Date: 05/18/2022 Nonbillable remote reviewed. Normal device function.  Duplicate send Next remote 91 days. Hassell Halim, RN, CCDS, [employer]  CUP PACEART REMOTE DEVICE CHECK  Result Date: 05/18/2022 Nonbillable remote reviewed. Normal device function.  Duplicate send Next remote 91 days. Hassell Halim, RN, CCDS, [employer]  CUP PACEART REMOTE DEVICE CHECK  Result Date: 05/18/2022 Scheduled remote reviewed. Normal device function.  Next remote 91 days. Hassell Halim, RN, CCDS, [employer]   Recent Labs: Lab Results  Component Value Date   WBC 6.6 05/03/2022   HGB 11.4 (L) 05/03/2022   PLT 155 05/03/2022   NA 141 05/03/2022   K 4.3 05/03/2022   CL 104 05/03/2022   CO2 29 05/03/2022   GLUCOSE 84 05/03/2022   BUN 28 (H) 05/03/2022   CREATININE 1.11 (H) 05/03/2022   BILITOT 0.6 05/03/2022   ALKPHOS 48 05/03/2022   AST 28 05/03/2022   ALT 14 05/03/2022   PROT 7.6 05/03/2022   ALBUMIN 3.8 05/03/2022   CALCIUM 9.2 05/03/2022    Speciality Comments: PLQ Eye Exam-02/19/2022 [facility], nothing abnormal related to PLQ F/u in October 2024.  Procedures:  No procedures performed Allergies: Other, Propofol, Flecainide, Amiodarone, and Amlodipine   Assessment / Plan:     Visit Diagnoses: Systemic lupus erythematosus, unspecified SLE type, unspecified organ involvement status (HCC)  Rheumatoid arthritis, involving unspecified site, unspecified whether rheumatoid factor present (HCC) - Plan: Sedimentation  rate, C3 and C4  Joint inflammation appears pretty well-controlled on exam will recheck sed rate and serum complements for assessing of disease activity.  Plan to continue on hydroxychloroquine 200 mg daily prednisone 5 mg daily.  Raynaud's disease without gangrene  Less frequent and warmer weather no associated inflammation ulcers or blistering changes.  Idiopathic pulmonary fibrosis (HCC)  Clinically appears pretty stable continuing maintenance on Ofev.  Ongoing follow-up with pulmonology clinic for  monitoring of progression.  Chronic gouty arthritis  Gout appears well-controlled on 100 mg allopurinol daily with no significant flareups described since our last visit.  Ankle pain is sometimes swelling more likely related to some pedal edema or less likely systemic inflammatory arthritis.  High risk medication use - Plan: CBC with Differential/Platelet, COMPLETE METABOLIC PANEL WITH GFR  Checking CBC and CMP for medication monitoring with the continued treatment on Hydroxychloroquine also on prednisone and the Ofev.  Has not had significant interval infections.  Orders: Orders Placed This Encounter  Procedures   Sedimentation rate   CBC with Differential/Platelet   COMPLETE METABOLIC PANEL WITH GFR   C3 and C4   No orders of the defined types were placed in this encounter.    Follow-Up Instructions: Return in about 6 months (around 11/02/2022) for SLE/RA on HCQ/GC f/u 6mos.   Fuller Plan, MD  Note - This record has been created using [employer].  Chart creation errors have been sought, but may not always  have been located. Such creation errors do not reflect on  the standard of medical care.

## 2022-05-03 ENCOUNTER — Encounter: Payer: Self-pay | Admitting: Internal Medicine

## 2022-05-03 ENCOUNTER — Ambulatory Visit: Payer: Medicare HMO | Attending: Internal Medicine | Admitting: Internal Medicine

## 2022-05-03 ENCOUNTER — Ambulatory Visit (INDEPENDENT_AMBULATORY_CARE_PROVIDER_SITE_OTHER): Payer: Medicare HMO | Admitting: Pulmonary Disease

## 2022-05-03 ENCOUNTER — Encounter: Payer: Self-pay | Admitting: Pulmonary Disease

## 2022-05-03 VITALS — BP 124/72 | HR 88 | Wt 122.4 lb

## 2022-05-03 VITALS — BP 133/76 | HR 70 | Resp 14 | Ht 63.0 in | Wt 124.0 lb

## 2022-05-03 DIAGNOSIS — M069 Rheumatoid arthritis, unspecified: Secondary | ICD-10-CM | POA: Diagnosis not present

## 2022-05-03 DIAGNOSIS — Z5181 Encounter for therapeutic drug level monitoring: Secondary | ICD-10-CM

## 2022-05-03 DIAGNOSIS — M329 Systemic lupus erythematosus, unspecified: Secondary | ICD-10-CM | POA: Diagnosis not present

## 2022-05-03 DIAGNOSIS — I7300 Raynaud's syndrome without gangrene: Secondary | ICD-10-CM | POA: Diagnosis not present

## 2022-05-03 DIAGNOSIS — J84112 Idiopathic pulmonary fibrosis: Secondary | ICD-10-CM

## 2022-05-03 DIAGNOSIS — Z79899 Other long term (current) drug therapy: Secondary | ICD-10-CM

## 2022-05-03 DIAGNOSIS — M1A00X0 Idiopathic chronic gout, unspecified site, without tophus (tophi): Secondary | ICD-10-CM

## 2022-05-03 LAB — HEPATIC FUNCTION PANEL
ALT: 15 U/L (ref 0–35)
AST: 29 U/L (ref 0–37)
Albumin: 3.8 g/dL (ref 3.5–5.2)
Alkaline Phosphatase: 48 U/L (ref 39–117)
Bilirubin, Direct: 0.1 mg/dL (ref 0.0–0.3)
Total Bilirubin: 0.6 mg/dL (ref 0.2–1.2)
Total Protein: 7.7 g/dL (ref 6.0–8.3)

## 2022-05-03 NOTE — Patient Instructions (Addendum)
Nice to see you again  No changes to medications  Please let me know if you need refills  Labs today  Return to clinic in 6 months or sooner as needed with [name]

## 2022-05-03 NOTE — Progress Notes (Signed)
Liver function is ok which is good

## 2022-05-03 NOTE — Progress Notes (Signed)
 @Patient  ID: Lindsey Huber, female    DOB: 02-09-1945, 77 y.o.   MRN: [id number]  Chief Complaint  Patient presents with   Follow-up    Pt is here for folllow up for IPF. Pt states that the Markus DaftBreztri is working well for her in regards to her inspirtory breathing. She states she does have a few episodes of diarrhea being on the ofev as well. Echocardiogram done on 03/22/22.     Referring provider: Collene MaresMiller, [name], PA  HPI:   77 y.o. woman whom I am seeing in follow up and ongoing care of DOE and ILD.  Recent cardiology note reviewed.  Overall, stable.  Some concern for worsening valve dysfunction.  Will continue to follow-up with cardiology.  Using [place]Breztri as prescribed.  Good relief from this.  Reports good adherence Ofev 150 mg twice daily.  Discussed role and rationale for labs, intensive drug monitoring.  HPI at initial visit: Patient recently moved to the area from [place].  Had multiple specialist including cardiology, pulmonary.  Chronic dyspnea for some time.  Diagnosed with RA related ILD.  Started on Ofev in the past.  Continues this.  Occasional diarrhea.  Dyspnea worse on inclines or stairs.  Present on flat surfaces over short distances as well.  No time of day when things are better or worse.  Dyspnea constant.  No seasonal environmental factors she can identify that make this better or worse.  No position make things better or worse.  No clear alleviating exacerbating factors.  She continues on inhalers.  Currently Trelegy.  PMH: ILD, valvular issues, heart issues, Surgical history: Heart valve surgery, pacemaker placement, tonsillectomy Family history: Mother had CAD, hypertension Father with CAD, hypertension Social history: Never smoker, recently moved to the area, lives in [place]  Questionaires / Pulmonary Flowsheets:   ACT:      No data to display           MMRC:     No data to display           Epworth:      No data to display            Tests:   FENO:  No results found for: "NITRICOXIDE"  PFT:     No data to display           WALK:     08/22/2020    2:52 PM  SIX MIN WALK  Medications levothyroxine 25mcg at 6am, allopurinol 100mg , hydroxychloroquine 200mg , prednisone 5mg  at 7am, and multivitamin at 12pm  Supplimental Oxygen during Test? (L/min) No  Laps 6  Partial Lap (in Meters) 0 meters  Baseline BP (sitting) 120/72  Baseline Heartrate 91  Baseline Dyspnea (Borg Scale) 3  Baseline Fatigue (Borg Scale) 2  Baseline SPO2 100 %  BP (sitting) 150/100  Heartrate 95  Dyspnea (Borg Scale) 6  Fatigue (Borg Scale) 4  SPO2 97 %  BP (sitting) 138/90  Heartrate 90  SPO2 100 %  Stopped or Paused before Six Minutes Yes  Other Symptoms at end of Exercise paused with 1 min 50 sec remaining for 2 minutes due to needing to catch her breath  Distance Completed 204 meters  Distance Completed 0 meters  Tech Comments: pt walked at a slow pace with a left knee brace on, stumbled multiple times against the wall due to the left knee and brace. pt paused with 1 min 50 sec remaining for 2 min to catch breath but was able to fully finish  the walk.   Imaging: None reviewed, requested  Lab Results:  CBC Personally reviewed most recent labs from PCP with minimal records sent    Component Value Date/Time   WBC 5.9 12/29/2021 0459   RBC 2.93 (L) 12/29/2021 0459   HGB 9.7 (L) 12/29/2021 0459   HCT 29.8 (L) 12/29/2021 0459   HCT NLAV 07/25/2021 1818   PLT 175 12/29/2021 0459   MCV 101.7 (H) 12/29/2021 0459   MCH 33.1 12/29/2021 0459   MCHC 32.6 12/29/2021 0459   RDW 15.4 12/29/2021 0459   LYMPHSABS 1.4 12/26/2021 1350   MONOABS 0.6 12/26/2021 1350   EOSABS 0.0 12/26/2021 1350   BASOSABS 0.0 12/26/2021 1350    BMET    Component Value Date/Time   NA 136 12/29/2021 0459   NA 142 12/04/2021 1436   K 4.7 01/04/2022 1636   CL 105 12/29/2021 0459   CO2 23 12/29/2021 0459   GLUCOSE 86 12/29/2021 0459   BUN 31  (H) 12/29/2021 0459   BUN 32 (H) 12/04/2021 1436   CREATININE 1.04 (H) 12/29/2021 0459   CREATININE 0.88 10/26/2021 1508   CALCIUM 8.3 (L) 12/29/2021 0459   GFRNONAA 56 (L) 12/29/2021 0459    BNP    Component Value Date/Time   BNP 718.0 (H) 12/26/2021 1350    ProBNP No results found for: "PROBNP"  Specialty Problems       Pulmonary Problems   Idiopathic pulmonary fibrosis   Allergic rhinitis   Epistaxis   Oropharyngeal dysphagia    Allergies  Allergen Reactions   Other Other (See Comments)    Patient is to NOT EAT any foods with husks or tree nuts, popcorn   Propofol Shortness Of Breath and Other (See Comments)    "Caused asthma"   Flecainide Other (See Comments)    Reaction not recalled   Amiodarone Other (See Comments)    Delirium/Confusion/Psychosis   Amlodipine Other (See Comments)    "Tired and syncope"    Immunization History  Administered Date(s) Administered   Influenza-Unspecified 10/26/2019, 10/07/2021   PFIZER(Purple Top)SARS-COV-2 Vaccination 04/17/2019, 05/08/2019, 11/24/2019    Past Medical History:  Diagnosis Date   Achalasia    Acquired hypothyroidism 08/20/2020   Acute metabolic encephalopathy 07/25/2021   AICD (automatic cardioverter/defibrillator) present 11/14/2019   Allergic rhinitis    Aortic valve disorder 03/26/2002   Atherosclerosis of abdominal aorta 05/01/2020   Cardiomyopathy    CHB (complete heart block) 08/18/2017   CHF (congestive heart failure)    Cholelithiasis without obstruction 05/01/2020   Chronic gouty arthritis    Chronic heart failure with preserved ejection fraction 05/01/2020   Chronic kidney disease (CKD), active medical management without dialysis, stage 3 (moderate) 05/01/2020   Closed torus fracture of distal end of right radius with delayed healing 08/12/2021   Delusional thoughts    Diverticulosis of colon 05/01/2020   Epistaxis    Essential (primary) hypertension 08/18/2017   Gastroesophageal reflux  disease 05/01/2020   Gastrointestinal hemorrhage    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 11/14/2019   Hypercoagulability due to atrial fibrillation 05/01/2020   Hyperlipidemia 11/14/2019   Hypocalcemia 12/14/2020   Hypoglycemia    Hypokalemia 12/14/2020   Hypomagnesemia 12/14/2020   Hypotension 12/14/2020   Idiopathic pulmonary fibrosis 05/01/2020   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   Iron deficiency anemia    Long term (current) use of anticoagulants    Malnutrition of mild degree [name]: 75% to less than 90% of standard weight)    Mild  dementia 08/12/2021   Mitral and aortic incompetence 11/14/2019   Non-rheumatic atrial fibrillation 05/01/2020   Non-toxic multinodular goiter 08/20/2020   NSVT (nonsustained ventricular tachycardia) 08/18/2017   Oropharyngeal dysphagia    Osteoarthritis of hip 05/01/2020   Osteoarthritis of knee 05/01/2020   Osteopenia of neck of left femur    Pain of left hip joint 12/12/2017   Proteinuria 05/01/2020   Raynaud's disease    Recurrent falls 04/09/2021   Rheumatoid arthritis    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   albuterol (VENTOLIN HFA) 108 (90 Base) MCG/ACT inhaler Inhale 2 puffs into the lungs every 6 (six) hours as needed for wheezing or shortness of breath.   allopurinol (ZYLOPRIM) 100 MG tablet TAKE 1 TABLET  BY MOUTH EVERY DAY   amoxicillin (AMOXIL) 500 MG capsule 4 capsules by mouth 1 hour before dental procedure.   Budeson-Glycopyrrol-Formoterol (BREZTRI AEROSPHERE) 160-9-4.8 MCG/ACT AERO Inhale 2 puffs into the lungs 2 (two) times daily as needed ("for flares").   Cholecalciferol (VITAMIN D3) 25 MCG (1000 UT) CAPS Take 1,000 Units by mouth daily with lunch.   famotidine (PEPCID) 20 MG tablet Take 20 mg by mouth daily as needed for heartburn or indigestion.   ferrous sulfate 325 (65 FE) MG tablet Take 325 mg by mouth See admin instructions. Take 325 mg by mouth with breakfast and supper   fluticasone (FLONASE) 50 MCG/ACT nasal spray Place 1 spray into both nostrils as needed for allergies or rhinitis.   furosemide (LASIX) 40 MG tablet TAKE TABLET BY MOUTH IN THE MORNING (SCHEDULED) AND AN ADDITIONAL TABLET AT BEDTIME IF SWELLING IS PRESENT. HOLD IF SYSTOLIC READING IS <110 OR DIASTOLIC READING IS <60.   Glycerin-Hypromellose-PEG 400 (DRY EYE RELIEF DROPS OP) Place 1 drop into both eyes in the morning.   hydroxychloroquine (PLAQUENIL) 200 MG tablet TAKE 1 TABLET BY MOUTH EVERY DAY   levothyroxine (SYNTHROID) 25 MCG tablet Take 1 tablet (25 mcg total) by mouth daily.   magnesium oxide (MAG-OX) 400 MG tablet Take 1 tablet (400 mg total) by mouth daily.   metoprolol succinate (TOPROL-XL) 25 MG 24 hr tablet TAKE 1 TABLET (25 MG TOTAL) BY MOUTH DAILY. HOLD DOSE IF <110 SBP OR <60 DBP   Nintedanib (OFEV) 150 MG CAPS Take 150 mg by mouth at bedtime.   OLANZapine (ZYPREXA) 2.5 MG tablet Take 1 tablet (2.5 mg total) by mouth at bedtime. TAKE 1 TABLET BY MOUTH EVERYDAY AT BEDTIME Strength: 2.5 mg   potassium chloride SA (KLOR-CON M) 10 MEQ tablet Take 2 tablets (20 mEq total) by mouth daily.   predniSONE (DELTASONE) 5 MG tablet Take 1 tablet (5 mg total) by mouth daily with breakfast. Patient to double up dose during sick day rule   Rivaroxaban (XARELTO) 15 MG TABS tablet Take 1 tablet (15 mg total) by mouth  daily with supper.   No facility-administered encounter  medications on file as of 05/03/2022.     Review of Systems  Review of Systems  N/a Physical Exam  BP 124/72 (BP Location: Left Arm, Cuff Size: Normal)   Pulse 88   Wt 122 lb 6.4 oz (55.5 kg)   SpO2 96%   BMI 21.68 kg/m   Wt Readings from Last 5 Encounters:  05/03/22 122 lb 6.4 oz (55.5 kg)  03/18/22 121 lb (54.9 kg)  03/16/22 121 lb (54.9 kg)  02/02/22 126 lb 12.8 oz (57.5 kg)  01/20/22 124 lb 12.8 oz (56.6 kg)    BMI Readings from Last 5 Encounters:  05/03/22 21.68 kg/m  03/18/22 21.43 kg/m  03/16/22 21.43 kg/m  02/02/22 22.46 kg/m  01/20/22 22.11 kg/m     Physical Exam General: Frail, sitting in chair Eyes: EOMI, no icterus Neck: Supple, no JVP Cardiovascular: Significant 3/6 late systolic murmur heard best right upper sternal border, regular rate Pulmonary: Crackles in bilateral bases, otherwise clear to auscultation bilaterally Abdomen: Nondistended, bowel sounds present MSK: No synovitis, joint effusion Neuro: Ambulates with assistance of cane, otherwise no weakness Psych: Normal mood, full affect   Assessment & Plan:   ILD: Presumed IPF per pulmonary notes, on Ofev 150 BID.  To continue this.  Consider repeat CT scan in future if symptoms worsen.  LFTs relatively stable, mild elevation in bilirubin improved, repeat labs today, intensive drug monitoring.  Dyspnea exertion: Related to ILD, pulmonary hypertension, asthma.  On therapy for ILD as above.  Mild improvement with escalation of inhaler therapy to [facility]. Consider PFTs in the future if symptoms fail to improve or worsen.  Asthma: Increase DOE, wheeze, atopic symptoms summer 2022.  Transition Trelegy to [facility] with improved symptoms.    Pulmonary hypertension: Presumed based on prior TTE.  Never on therapy.  Right heart cath 11/2020 with mild pulmonary hypertension a PVR of 2, normal wedge.  Reportedly significant valvular disease, like  combination  of group 2 and group 3.  She is poor candidate for therapy based on her age, frailty, etiology of disease.    Return in about 6 months (around 11/02/2022).   Karren Burly, MD 05/03/2022

## 2022-05-04 LAB — CBC WITH DIFFERENTIAL/PLATELET
Basophils Absolute: 20 cells/uL (ref 0–200)
Eosinophils Relative: 2.1 %
RDW: 13.2 % (ref 11.0–15.0)
WBC: 6.6 10*3/uL (ref 3.8–10.8)

## 2022-05-04 LAB — COMPLETE METABOLIC PANEL WITH GFR
AG Ratio: 1 (calc) (ref 1.0–2.5)
Albumin: 3.8 g/dL (ref 3.6–5.1)
Alkaline phosphatase (APISO): 51 U/L (ref 37–153)
BUN/Creatinine Ratio: 25 (calc) — ABNORMAL HIGH (ref 6–22)
Chloride: 104 mmol/L (ref 98–110)
Potassium: 4.3 mmol/L (ref 3.5–5.3)
Total Bilirubin: 0.6 mg/dL (ref 0.2–1.2)
Total Protein: 7.6 g/dL (ref 6.1–8.1)
eGFR: 52 mL/min/{1.73_m2} — ABNORMAL LOW (ref >=60)

## 2022-05-05 LAB — COMPLETE METABOLIC PANEL WITH GFR
ALT: 14 U/L (ref 6–29)
AST: 28 U/L (ref 10–35)
BUN: 28 mg/dL — ABNORMAL HIGH (ref 7–25)
CO2: 29 mmol/L (ref 20–32)
Calcium: 9.2 mg/dL (ref 8.6–10.4)
Creat: 1.11 mg/dL — ABNORMAL HIGH (ref 0.60–1.00)
Globulin: 3.8 g/dL (calc) — ABNORMAL HIGH (ref 1.9–3.7)
Glucose, Bld: 84 mg/dL (ref 65–99)
Sodium: 141 mmol/L (ref 135–146)

## 2022-05-05 LAB — CBC WITH DIFFERENTIAL/PLATELET
Absolute Monocytes: 554 cells/uL (ref 200–950)
Basophils Relative: 0.3 %
Eosinophils Absolute: 139 cells/uL (ref 15–500)
HCT: 34.6 % — ABNORMAL LOW (ref 35.0–45.0)
Hemoglobin: 11.4 g/dL — ABNORMAL LOW (ref 11.7–15.5)
Lymphs Abs: 1822 cells/uL (ref 850–3900)
MCH: 32.8 pg (ref 27.0–33.0)
MCHC: 32.9 g/dL (ref 32.0–36.0)
MCV: 99.4 fL (ref 80.0–100.0)
MPV: 10.2 fL (ref 7.5–12.5)
Monocytes Relative: 8.4 %
Neutro Abs: 4066 cells/uL (ref 1500–7800)
Neutrophils Relative %: 61.6 %
Platelets: 155 10*3/uL (ref 140–400)
RBC: 3.48 10*6/uL — ABNORMAL LOW (ref 3.80–5.10)
Total Lymphocyte: 27.6 %

## 2022-05-05 LAB — C3 AND C4
C3 Complement: 125 mg/dL (ref 83–193)
C4 Complement: 33 mg/dL (ref 15–57)

## 2022-05-05 LAB — SEDIMENTATION RATE: Sed Rate: 55 mm/h — ABNORMAL HIGH (ref 0–30)

## 2022-05-07 ENCOUNTER — Other Ambulatory Visit: Payer: Self-pay | Admitting: Pulmonary Disease

## 2022-05-10 NOTE — Telephone Encounter (Signed)
Ok to refill 

## 2022-05-18 ENCOUNTER — Ambulatory Visit (INDEPENDENT_AMBULATORY_CARE_PROVIDER_SITE_OTHER): Payer: Medicare HMO

## 2022-05-18 DIAGNOSIS — I429 Cardiomyopathy, unspecified: Secondary | ICD-10-CM

## 2022-05-18 LAB — CUP PACEART REMOTE DEVICE CHECK
Battery Remaining Longevity: 50 mo
Battery Remaining Longevity: 50 mo
Battery Remaining Longevity: 50 mo
Battery Voltage: 2.98 V
Battery Voltage: 2.96 V
Battery Voltage: 2.96 V
Brady Statistic RV Percent Paced: 99.84 %
Brady Statistic RV Percent Paced: 100 %
Brady Statistic RV Percent Paced: 100 %
Date Time Interrogation Session: 20240423044222
Date Time Interrogation Session: 20240423070747
Date Time Interrogation Session: 20240423071026
HighPow Impedance: 54 Ohm
HighPow Impedance: 54 Ohm
HighPow Impedance: 54 Ohm
Implantable Lead Connection Status: 753985
Implantable Lead Connection Status: 753985
Implantable Lead Connection Status: 753985
Implantable Lead Implant Date: 20120920
Implantable Lead Implant Date: 20120920
Implantable Lead Implant Date: 20120920
Implantable Lead Location: 753862
Implantable Lead Location: 753862
Implantable Lead Location: 753862
Implantable Pulse Generator Implant Date: 20190809
Implantable Pulse Generator Implant Date: 20190809
Implantable Pulse Generator Implant Date: 20190809
Lead Channel Impedance Value: 285 Ohm
Lead Channel Impedance Value: 304 Ohm
Lead Channel Impedance Value: 285 Ohm
Lead Channel Impedance Value: 285 Ohm
Lead Channel Impedance Value: 304 Ohm
Lead Channel Impedance Value: 304 Ohm
Lead Channel Pacing Threshold Amplitude: 0.625 V
Lead Channel Pacing Threshold Amplitude: 0.625 V
Lead Channel Pacing Threshold Amplitude: 0.625 V
Lead Channel Pacing Threshold Pulse Width: 0.4 ms
Lead Channel Pacing Threshold Pulse Width: 0.4 ms
Lead Channel Pacing Threshold Pulse Width: 0.4 ms
Lead Channel Sensing Intrinsic Amplitude: 8.75 mV
Lead Channel Sensing Intrinsic Amplitude: 8.75 mV
Lead Channel Sensing Intrinsic Amplitude: 8.75 mV
Lead Channel Sensing Intrinsic Amplitude: 8.75 mV
Lead Channel Sensing Intrinsic Amplitude: 8.75 mV
Lead Channel Sensing Intrinsic Amplitude: 8.75 mV
Lead Channel Setting Pacing Amplitude: 1.25 V
Lead Channel Setting Pacing Amplitude: 1.25 V
Lead Channel Setting Pacing Amplitude: 1.25 V
Lead Channel Setting Pacing Pulse Width: 0.4 ms
Lead Channel Setting Pacing Pulse Width: 0.4 ms
Lead Channel Setting Pacing Pulse Width: 0.4 ms
Lead Channel Setting Sensing Sensitivity: 0.3 mV
Lead Channel Setting Sensing Sensitivity: 0.3 mV
Lead Channel Setting Sensing Sensitivity: 0.3 mV
Zone Setting Status: 755011
Zone Setting Status: 755011
Zone Setting Status: 755011
Zone Setting Status: 755011
Zone Setting Status: 755011
Zone Setting Status: 755011

## 2022-05-31 ENCOUNTER — Other Ambulatory Visit: Payer: Self-pay | Admitting: Physician Assistant

## 2022-05-31 ENCOUNTER — Other Ambulatory Visit: Payer: Self-pay | Admitting: Neurology

## 2022-05-31 ENCOUNTER — Other Ambulatory Visit: Payer: Self-pay | Admitting: Internal Medicine

## 2022-05-31 DIAGNOSIS — M1A09X0 Idiopathic chronic gout, multiple sites, without tophus (tophi): Secondary | ICD-10-CM

## 2022-06-01 NOTE — Telephone Encounter (Signed)
Last Fill: 11/20/2021  Labs: 05/03/2022 RBC 3.48 Hemoglobin 11.4 HCT 34.6 BUN 28 Creat 1.11 eGFR 52 BUN/Creatinine Ratio 25 Globulin 3.8  06/27/2020 Uric Acid 5.8  Next Visit: 11/03/2022  Last Visit: 05/03/2022  DX: Chronic gouty arthritis   Current Dose per office note 05/03/2022: Not mentioned   Patient's last Uric Acid was 06/27/2020. Please advise.    Okay to refill Allopurinol?

## 2022-06-02 ENCOUNTER — Telehealth: Payer: Self-pay | Admitting: Cardiology

## 2022-06-02 NOTE — Telephone Encounter (Signed)
Called pt left VM for her to return call to device clinic

## 2022-06-02 NOTE — Telephone Encounter (Signed)
Patient states her monitor started flashing and now it's not lighting up at all. Patient would like a call back to discuss.

## 2022-06-04 NOTE — Telephone Encounter (Signed)
Pt called [employer] and monitor is now working.

## 2022-06-07 ENCOUNTER — Telehealth: Payer: Self-pay

## 2022-06-07 NOTE — Telephone Encounter (Addendum)
Following alert received from [employer] received for HF diagnostics currently abnormal.   Patient called and denies any symptoms including swelling in lower extremities, shortness of breath, needing more pillows to sleep at night or trouble breathing when lying down. Reports compliance with lasix 40 mg daily. Encouraged to avoid salty foods. Reports of fall several days ago d/ ankle giving way - pt reports d/t arthritis. No head injury, OAC noted on file. Routing to Dr. [name] and Dr. Lalla Brothers for review.

## 2022-06-07 NOTE — Telephone Encounter (Signed)
Patient called and advised to start tomorrow taking Lasix 80 mg in morning and send a remote transmission 06/10/22. Patient voiced understanding.

## 2022-06-10 NOTE — Telephone Encounter (Signed)
Called patient to request resend of transmission to recheck fluid status. Patient voiced understanding.

## 2022-06-10 NOTE — Telephone Encounter (Signed)
Yes, she took Lasix 80mg  x2 days.

## 2022-06-10 NOTE — Telephone Encounter (Signed)
Updated Optivol.

## 2022-06-11 ENCOUNTER — Ambulatory Visit (INDEPENDENT_AMBULATORY_CARE_PROVIDER_SITE_OTHER): Payer: Medicare HMO | Admitting: Surgical

## 2022-06-11 ENCOUNTER — Encounter: Payer: Self-pay | Admitting: Surgical

## 2022-06-11 ENCOUNTER — Other Ambulatory Visit (INDEPENDENT_AMBULATORY_CARE_PROVIDER_SITE_OTHER): Payer: Medicare HMO

## 2022-06-11 DIAGNOSIS — S8262XA Displaced fracture of lateral malleolus of left fibula, initial encounter for closed fracture: Secondary | ICD-10-CM | POA: Diagnosis not present

## 2022-06-11 DIAGNOSIS — M25572 Pain in left ankle and joints of left foot: Secondary | ICD-10-CM

## 2022-06-11 MED ORDER — HYDROCODONE-ACETAMINOPHEN 5-325 MG PO TABS: 1.0000 | ORAL_TABLET | Freq: Two times a day (BID) | ORAL | 0 refills | Status: DC | PRN

## 2022-06-11 NOTE — Progress Notes (Signed)
Office Visit Note   Patient: Lindsey Huber           Date of Birth: 1945/08/14           MRN: [id number] Visit Date: 06/11/2022 Requested by: Collene Mares, PA [address] [place],  [place] [address] PCP: Hyacinth Meeker, [place], [name]  Subjective: Chief Complaint  Patient presents with   Left Ankle - Injury    DOI 06/02/2022    HPI: Lindsey Huber is a 77 y.o. female who presents to the office reporting left ankle pain.  Patient states that she fell and twisted her ankle on 06/02/2022.  She was seen at the [facility] urgent care on 06/04/2022 and told that she had ankle fracture that may need surgery.  She has been taking Tylenol for pain control.  She has been nonweightbearing in fracture boot since being seen at urgent care.  Taking Xarelto with last dose last night.  She takes this every evening.  She denies any fevers or chills or any chest pain/shortness of breath.  No calf pain.  majority of her pain is on the lateral aspect of the ankle but she does have some medial sided pain as well.  She has no history of left ankle surgery but she does have history of prior right ankle fracture that was treated nonoperatively.  Currently her right ankle is doing well and she is very satisfied with how that feels.              ROS: All systems reviewed are negative as they relate to the chief complaint within the history of present illness.  Patient denies fevers or chills.  Assessment & Plan: Visit Diagnoses:  1. Closed fracture of distal lateral malleolus of left fibula, initial encounter   2. Pain in left ankle and joints of left foot     Plan: Patient is a 77 year old female who presents complaining of left ankle injury.  She has history of right ankle fracture treated nonoperatively.  She fell and twisted her ankle about 9 days ago with radiographs from that time demonstrating lateral malleolus fracture.  She has new radiographs taken today demonstrating no significant change in  position at the fracture site.  She has ankle mortise that is overall well-maintained.  No significant abnormality in regards to the tibiofibular overlap on AP and oblique views.  No significant widening of the medial clear space.  Plan is to pursue nonoperative management since she did well with this on her right side and she has extensive medical history.  She will continue with her Xarelto.  We will continue with fracture boot immobilization and she will work on resolving the swelling with elevation.  Will see her back in 1 week for clinical recheck and likely placement of short leg cast at that time.  Follow-Up Instructions: No follow-ups on file.   Orders:  Orders Placed This Encounter  Procedures   XR Ankle Complete Left   No orders of the defined types were placed in this encounter.     Procedures: No procedures performed   Clinical Data: No additional findings.  Objective: Vital Signs: There were no vitals taken for this visit.  Physical Exam:  Constitutional: Patient appears well-developed HEENT:  Head: Normocephalic Eyes:EOM are normal Neck: Normal range of motion Cardiovascular: Normal rate Pulmonary/chest: Effort normal Neurologic: Patient is alert Skin: Skin is warm Psychiatric: Patient has normal mood and affect  Ortho Exam: Ortho exam demonstrates left ankle with significant swelling  and erythema.  This erythema improves with elevation of the extremity but does not resolve.  She has no calf tenderness.  Negative Homans' sign.  She has intact ankle dorsiflexion and plantarflexion.  Tenderness over the lateral malleolus.  Tenderness over the medial malleolus as well.  There is no tenderness at the proximal fibula.  She has no plantar ecchymosis.  There is intact sensation in the first webspace.  Left foot is warm and well-perfused.  There is no injury to the skin such as laceration, abrasion.  No bleeding.  Specialty Comments:  No specialty comments  available.  Imaging: No results found.   PMFS History: Patient Active Problem List   Diagnosis Date Noted   Symptomatic anemia 12/26/2021   Mild dementia 08/12/2021   Allergic rhinitis    Chronic gouty arthritis    Epistaxis    Hypoglycemia    Iron deficiency anemia    Long term (current) use of anticoagulants    Malnutrition of mild degree [name]: 75% to less than 90% of standard weight)    Oropharyngeal dysphagia    Osteopenia of neck of left femur    Raynaud's disease    Rheumatoid arthritis    Transient ischemic attack    Slow transit constipation    Thrombophilia    Achalasia    Delusional thoughts    Recurrent falls 04/09/2021   Hypotension 12/14/2020   Hypokalemia 12/14/2020   Hypocalcemia 12/14/2020   Hypomagnesemia 12/14/2020   Non-toxic multinodular goiter 08/20/2020   Acquired hypothyroidism 08/20/2020   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   Vitamin D deficiency 05/01/2020   Proteinuria 05/01/2020   Osteoarthritis of knee 05/01/2020   Osteoarthritis of hip 05/01/2020   Gastroesophageal reflux disease 05/01/2020   Idiopathic pulmonary fibrosis 05/01/2020   Cardiomyopathy 05/01/2020   Hypercoagulability due to atrial fibrillation 05/01/2020   Systemic lupus erythematosus 05/01/2020   Chronic heart failure with preserved ejection fraction 05/01/2020   Chronic kidney disease (CKD), active medical management without dialysis, stage 3 (moderate) 05/01/2020   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   Non-rheumatic atrial fibrillation 05/01/2020   Atherosclerosis of abdominal aorta 05/01/2020   Diverticulosis of colon 05/01/2020   Cholelithiasis without obstruction 05/01/2020   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   AICD (automatic cardioverter/defibrillator) present 11/14/2019   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 11/14/2019   Mitral and aortic incompetence 11/14/2019   Hyperlipidemia  11/14/2019   Pain of left hip joint 12/12/2017   Essential (primary) hypertension 08/18/2017   CHB (complete heart block) 08/18/2017   NSVT (nonsustained ventricular tachycardia) 08/18/2017   Aortic valve disorder 03/26/2002   Past Medical History:  Diagnosis Date   Achalasia    Acquired hypothyroidism 08/20/2020   Acute metabolic encephalopathy 07/25/2021   AICD (automatic cardioverter/defibrillator) present 11/14/2019   Allergic rhinitis    Ankle fracture 03/25/2022   Aortic valve disorder 03/26/2002   Atherosclerosis of abdominal aorta (HCC) 05/01/2020   Cardiomyopathy (HCC)    CHB (complete heart block) (HCC) 08/18/2017   CHF (congestive heart failure) (HCC)    Cholelithiasis without obstruction 05/01/2020   Chronic gouty arthritis    Chronic heart failure with preserved ejection fraction (HCC) 05/01/2020   Chronic kidney disease (CKD), active medical management without dialysis, stage 3 (moderate) (HCC) 05/01/2020   Closed torus fracture of distal end of right radius with delayed healing 08/12/2021   Delusional thoughts (HCC)    Diverticulosis of colon 05/01/2020   Epistaxis    Essential (primary) hypertension 08/18/2017  Gastroesophageal reflux disease 05/01/2020   Gastrointestinal hemorrhage    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 11/14/2019   Hypercoagulability due to atrial fibrillation (HCC) 05/01/2020   Hyperlipidemia 11/14/2019   Hypocalcemia 12/14/2020   Hypoglycemia    Hypokalemia 12/14/2020   Hypomagnesemia 12/14/2020   Hypotension 12/14/2020   Idiopathic pulmonary fibrosis (HCC) 05/01/2020   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   Iron deficiency anemia    Long term (current) use of anticoagulants    Malnutrition of mild degree [name]: 75% to less than 90% of standard weight) (HCC)    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/12/2021   Mitral and aortic incompetence 11/14/2019   Non-rheumatic atrial fibrillation (HCC) 05/01/2020   Non-toxic  multinodular goiter 08/20/2020   NSVT (nonsustained ventricular tachycardia) (HCC) 08/18/2017   Oropharyngeal dysphagia    Osteoarthritis of hip 05/01/2020   Osteoarthritis of knee 05/01/2020   Osteopenia of neck of left femur    Pain of left hip joint 12/12/2017   Proteinuria 05/01/2020   Raynaud's disease    Recurrent falls 04/09/2021   Rheumatoid arthritis (HCC)    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   Septic shock (HCC) 03/10/2020   Slow transit constipation    Systemic lupus erythematosus (HCC) 05/01/2020   Thrombophilia (HCC)    Transient ischemic attack    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   Vitamin D deficiency     Family History  Problem Relation Age of Onset   Heart disease Mother    Hypertension Mother    Rheum arthritis Mother    Osteoarthritis Mother    Heart disease Father    Hypertension Father    Osteoarthritis Father    Heart disease Sister    Diabetes Brother    Cancer Brother     Past Surgical History:  Procedure Laterality Date   BREAST BIOPSY Left    CARDIAC VALVE SURGERY     CARPAL TUNNEL RELEASE     COLONOSCOPY WITH PROPOFOL N/A 12/20/2020   Procedure: COLONOSCOPY WITH PROPOFOL;  Surgeon: Willis Modena, MD;  Location: [facility] ENDOSCOPY;  Service: Endoscopy;  Laterality: N/A;   HEMORRHOID SURGERY     PACEMAKER INSERTION     RIGHT/LEFT HEART CATH AND CORONARY ANGIOGRAPHY N/A 11/27/2020   Procedure: RIGHT/LEFT HEART CATH AND CORONARY ANGIOGRAPHY;  Surgeon: Dolores Patty, MD;  Location: [facility] INVASIVE CV LAB;  Service: Cardiovascular;  Laterality: N/A;   TONSILLECTOMY     Social History   Occupational History   Occupation: Retired    Comment: Community education officer business  Tobacco Use   Smoking status: Never    Passive exposure: Past   Smokeless tobacco: Never  Vaping Use   Vaping Use: Never used  Substance and Sexual Activity   Alcohol use: Never   Drug use: Never   Sexual activity: Not  Currently

## 2022-06-14 NOTE — Telephone Encounter (Signed)
App follow 06/25/22 @ 330

## 2022-06-14 NOTE — Telephone Encounter (Signed)
Spoke to patients daughter ~ Marcelino Duster, advised for patient to take Lasix 80 mg daily until further notice and send a remote transmission 06/22/22 so we can recheck fluid level. Voiced understanding and states she will let pt know. Advised to call if any questions arise.

## 2022-06-15 NOTE — Progress Notes (Signed)
Remote ICD transmission.   

## 2022-06-18 ENCOUNTER — Other Ambulatory Visit (INDEPENDENT_AMBULATORY_CARE_PROVIDER_SITE_OTHER): Payer: Medicare HMO

## 2022-06-18 ENCOUNTER — Ambulatory Visit (INDEPENDENT_AMBULATORY_CARE_PROVIDER_SITE_OTHER): Payer: Medicare HMO | Admitting: Surgical

## 2022-06-18 DIAGNOSIS — S8262XA Displaced fracture of lateral malleolus of left fibula, initial encounter for closed fracture: Secondary | ICD-10-CM | POA: Diagnosis not present

## 2022-06-20 ENCOUNTER — Encounter: Payer: Self-pay | Admitting: Surgical

## 2022-06-20 NOTE — Progress Notes (Signed)
Post-Fracture Visit Note   Patient: Lindsey Huber           Date of Birth: 09-12-1945           MRN: [id number] Visit Date: 06/18/2022 PCP: Collene Mares, PA   Assessment & Plan:  Chief Complaint:  Chief Complaint  Patient presents with   Left Ankle - Pain   Visit Diagnoses:  1. Closed fracture of distal lateral malleolus of left fibula, initial encounter     Plan: Patient is a 77 year old female who presents for a return visit for mild displaced lateral malleolus fracture of the left ankle.  States that she has been working on elevating this ankle is much as possible.  She has been doing some weightbearing in a fracture boot.  Pain is improving though it still persists.  No chest pain, shortness of breath, calf pain.  On exam, patient has palpable DP pulse.  Intact ankle dorsiflexion and plantarflexion.  She has continued tenderness over the lateral malleolus and some mild tenderness over the medial malleolus.  Achilles tendon is palpable and intact.  No proximal fibular tenderness.  Does have some tenderness over the lateral and medial joint lines of the left knee.  Plan is conversion from fracture boot to short leg cast.  Will leave this on for 2 weeks and remove at that time to transition to fracture boot and weightbearing as tolerated.  She has radiographs of the left ankle taken today demonstrating no significant displacement at the fracture site compared with prior radiographs.  Also has radiographs of the left knee demonstrating no fracture and her increased pain in the knee is likely exacerbation of her arthritis.  Follow-up in 2 weeks.  Short leg cast was well-padded and patient was encouraged to reach out if she has any discomfort with the cast placement.  Follow-Up Instructions: No follow-ups on file.   Orders:  Orders Placed This Encounter  Procedures   XR Ankle Complete Left   XR Knee 1-2 Views Left   No orders of the defined types were placed in this  encounter.   Imaging: No results found.  PMFS History: Patient Active Problem List   Diagnosis Date Noted   Symptomatic anemia 12/26/2021   Mild dementia 08/12/2021   Allergic rhinitis    Chronic gouty arthritis    Epistaxis    Hypoglycemia    Iron deficiency anemia    Long term (current) use of anticoagulants    Malnutrition of mild degree [name]: 75% to less than 90% of standard weight)    Oropharyngeal dysphagia    Osteopenia of neck of left femur    Raynaud's disease    Rheumatoid arthritis    Transient ischemic attack    Slow transit constipation    Thrombophilia    Achalasia    Delusional thoughts    Recurrent falls 04/09/2021   Hypotension 12/14/2020   Hypokalemia 12/14/2020   Hypocalcemia 12/14/2020   Hypomagnesemia 12/14/2020   Non-toxic multinodular goiter 08/20/2020   Acquired hypothyroidism 08/20/2020   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   Vitamin D deficiency 05/01/2020   Proteinuria 05/01/2020   Osteoarthritis of knee 05/01/2020   Osteoarthritis of hip 05/01/2020   Gastroesophageal reflux disease 05/01/2020   Idiopathic pulmonary fibrosis 05/01/2020   Cardiomyopathy 05/01/2020   Hypercoagulability due to atrial fibrillation 05/01/2020   Systemic lupus erythematosus 05/01/2020   Chronic heart failure with preserved ejection fraction 05/01/2020   Chronic kidney disease (CKD), active medical management without dialysis, stage 3 (moderate) 05/01/2020  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   Non-rheumatic atrial fibrillation 05/01/2020   Atherosclerosis of abdominal aorta 05/01/2020   Diverticulosis of colon 05/01/2020   Cholelithiasis without obstruction 05/01/2020   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   AICD (automatic cardioverter/defibrillator) present 11/14/2019   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 11/14/2019   Mitral and aortic incompetence 11/14/2019   Hyperlipidemia  11/14/2019   Pain of left hip joint 12/12/2017   Essential (primary) hypertension 08/18/2017   CHB (complete heart block) 08/18/2017   NSVT (nonsustained ventricular tachycardia) 08/18/2017   Aortic valve disorder 03/26/2002   Past Medical History:  Diagnosis Date   Achalasia    Acquired hypothyroidism 08/20/2020   Acute metabolic encephalopathy 07/25/2021   AICD (automatic cardioverter/defibrillator) present 11/14/2019   Allergic rhinitis    Ankle fracture 03/25/2022   Aortic valve disorder 03/26/2002   Atherosclerosis of abdominal aorta (HCC) 05/01/2020   Cardiomyopathy (HCC)    CHB (complete heart block) (HCC) 08/18/2017   CHF (congestive heart failure) (HCC)    Cholelithiasis without obstruction 05/01/2020   Chronic gouty arthritis    Chronic heart failure with preserved ejection fraction (HCC) 05/01/2020   Chronic kidney disease (CKD), active medical management without dialysis, stage 3 (moderate) (HCC) 05/01/2020   Closed torus fracture of distal end of right radius with delayed healing 08/12/2021   Delusional thoughts (HCC)    Diverticulosis of colon 05/01/2020   Epistaxis    Essential (primary) hypertension 08/18/2017   Gastroesophageal reflux disease 05/01/2020   Gastrointestinal hemorrhage    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 11/14/2019   Hypercoagulability due to atrial fibrillation (HCC) 05/01/2020   Hyperlipidemia 11/14/2019   Hypocalcemia 12/14/2020   Hypoglycemia    Hypokalemia 12/14/2020   Hypomagnesemia 12/14/2020   Hypotension 12/14/2020   Idiopathic pulmonary fibrosis (HCC) 05/01/2020   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   Iron deficiency anemia    Long term (current) use of anticoagulants    Malnutrition of mild degree [name]: 75% to less than 90% of standard weight) (HCC)    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/12/2021   Mitral and aortic incompetence 11/14/2019   Non-rheumatic atrial fibrillation (HCC) 05/01/2020   Non-toxic  multinodular goiter 08/20/2020   NSVT (nonsustained ventricular tachycardia) (HCC) 08/18/2017   Oropharyngeal dysphagia    Osteoarthritis of hip 05/01/2020   Osteoarthritis of knee 05/01/2020   Osteopenia of neck of left femur    Pain of left hip joint 12/12/2017   Proteinuria 05/01/2020   Raynaud's disease    Recurrent falls 04/09/2021   Rheumatoid arthritis (HCC)    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   Septic shock (HCC) 03/10/2020   Slow transit constipation    Systemic lupus erythematosus (HCC) 05/01/2020   Thrombophilia (HCC)    Transient ischemic attack    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   Vitamin D deficiency     Family History  Problem Relation Age of Onset   Heart disease Mother    Hypertension Mother    Rheum arthritis Mother    Osteoarthritis Mother    Heart disease Father    Hypertension Father    Osteoarthritis Father    Heart disease Sister    Diabetes Brother    Cancer Brother     Past Surgical History:  Procedure Laterality Date   BREAST BIOPSY Left    CARDIAC VALVE SURGERY     CARPAL TUNNEL RELEASE     COLONOSCOPY WITH PROPOFOL N/A 12/20/2020  Procedure: COLONOSCOPY WITH PROPOFOL;  Surgeon: Willis Modena, MD;  Location: [facility] ENDOSCOPY;  Service: Endoscopy;  Laterality: N/A;   HEMORRHOID SURGERY     PACEMAKER INSERTION     RIGHT/LEFT HEART CATH AND CORONARY ANGIOGRAPHY N/A 11/27/2020   Procedure: RIGHT/LEFT HEART CATH AND CORONARY ANGIOGRAPHY;  Surgeon: Dolores Patty, MD;  Location: [facility] INVASIVE CV LAB;  Service: Cardiovascular;  Laterality: N/A;   TONSILLECTOMY     Social History   Occupational History   Occupation: Retired    Comment: Community education officer business  Tobacco Use   Smoking status: Never    Passive exposure: Past   Smokeless tobacco: Never  Vaping Use   Vaping Use: Never used  Substance and Sexual Activity   Alcohol use: Never   Drug use: Never   Sexual activity: Not  Currently

## 2022-06-22 ENCOUNTER — Ambulatory Visit (INDEPENDENT_AMBULATORY_CARE_PROVIDER_SITE_OTHER): Payer: Medicare HMO

## 2022-06-22 DIAGNOSIS — I5032 Chronic diastolic (congestive) heart failure: Secondary | ICD-10-CM

## 2022-06-22 LAB — CUP PACEART REMOTE DEVICE CHECK
Battery Remaining Longevity: 50 mo
Battery Voltage: 2.98 V
Brady Statistic RV Percent Paced: 99.86 %
Date Time Interrogation Session: 20240528033524
HighPow Impedance: 61 Ohm
Implantable Lead Connection Status: 753985
Implantable Lead Implant Date: 20120920
Implantable Lead Location: 753862
Implantable Pulse Generator Implant Date: 20190809
Lead Channel Impedance Value: 247 Ohm
Lead Channel Impedance Value: 304 Ohm
Lead Channel Pacing Threshold Amplitude: 0.5 V
Lead Channel Pacing Threshold Pulse Width: 0.4 ms
Lead Channel Sensing Intrinsic Amplitude: 9.625 mV
Lead Channel Sensing Intrinsic Amplitude: 9.625 mV
Lead Channel Setting Pacing Amplitude: 1 V
Lead Channel Setting Pacing Pulse Width: 0.4 ms
Lead Channel Setting Sensing Sensitivity: 0.3 mV
Zone Setting Status: 755011
Zone Setting Status: 755011

## 2022-06-22 NOTE — Telephone Encounter (Signed)
Updated Optivol.   

## 2022-06-24 NOTE — Progress Notes (Signed)
ADVANCED HF CLINIC NOTE  Referring Physician: Dr. Lalla Brothers Primary Care: Claris Gower  Primary Cardiologist: EP Steffanie Dunn HF MD: Dr Tasia Catchings  HPI: Lindsey Huber is a 77 y.o. female with chronic diastolic CHF, permanent AF, AFL s/p ablation, SLE, pulmonary fibrosis, pulmonary hypertension. paroxysmal VT, OSA not on cpap and valvular disease s/p AVR/MV ring/TVR. Referred by Dr. Lalla Brothers in 10/22 for further evaluation of PAH and valvular heart disease.  Underwent  AVR/MV ring/TVR 09/25/2010.  Paroxysmal VT, ICD placed due to torsades on Flecainide with SSS requiring PPM which predated valve repar/replacement surgery 09/25/2010 and in fact ICD lead was damaged during valve replacement surgery.       History of chronic COPD and pulmonary fibrosis followed by pulmonology.  On triple therapy for COPD vs asthma, nintedanib for IPF. Followed by Dr Judeth Horn, Did not smoke but inhaled second hand smoke from husband and mother pretty much her whole life until 01/2020.   Echo 11/20: EF 63%,normal bioprosthetic tricuspid and aortic valve with mild tricuspid regurgitation, mitral valve repair with mild mitral regurgitation, severe biatrial enlargement.   Admitted in [place], [place] for hypotension and acute kidney injury 03/10/2020.  She was placed on broad-spectrum antibiotics with initial diagnosis of sepsis, no source of infection found also required Levophed and IVF There was question of whether adrenal insufficiency contributing.  Steroid therapy reinstituted.   Echo 03/11/2020  LVEF 55 to 60%, moderate to severe tricuspid regurgitation, moderately elevated right ventricular systolic pressure estimate 51mm Hg, moderate mitral regurgitation.  RV not well visualized.    Seen in HF Clinic for first visit 10/27/20. Had NYHA IIIB symptoms. Referred for R/L cath--> normal coronaries, EF 60-65%, and normal left sided filling pressures.   R/LHC 11/27/20: Ao =129/63 (89) LV = 141/8 RA = 11 (v waves  to 14) RV = 43/4 PA = 42/15 (25) PCW = 10 Fick cardiac output/index = 5.7/3.7 PVR = 2.1 WU SVR = 1101 PAPi = 2.45 WU. Normal coronary arteries, EF 60-65%, very mild PAH. Normal left-sided filling pressures with no significant v-waves in PCWP tracing to suggest hemodynamically significant MR. No significant stenosis across AoV, MV or TV  Admitted 11/22 with acute LGIB. CT scan showed acute GI hemorrhage involving mid descending colon, likely acute diverticular bleed. Transfused 3u RBCs. Colonoscopy 11/26 which noted hemorrhoids and diverticulosis, no active bleeding, recommended to restart Xarelto in 3 days if stable.   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 Overall feeling ok. Denies palpitations, minimally dizzy, no edema, or PND/Orthopnea. Feels a pain in her chest, intermittently when she lays down. SOB with ambulation and when she lays flat. Sleeps on 2 pillows. Appetite ok, daughter makes sure she eats. Has frequent headaches, drinks tylenol which helps. No fever or chills. Fell 2 weeks ago, is in a cast. Weight at home 123 pounds. Taking all medications. Denies ETOH, smoking.  BP at home 120s-150s.    Past Medical History:  Diagnosis Date   Achalasia    Acquired hypothyroidism 08/20/2020   Acute metabolic encephalopathy 07/25/2021   AICD (automatic cardioverter/defibrillator) present 11/14/2019   Allergic rhinitis    Ankle fracture 03/25/2022   Aortic valve disorder 03/26/2002   Atherosclerosis of abdominal aorta (HCC) 05/01/2020   Cardiomyopathy (HCC)    CHB (complete heart block) (HCC) 08/18/2017   CHF (  congestive heart failure) (HCC)    Cholelithiasis without obstruction 05/01/2020   Chronic gouty arthritis    Chronic heart failure with preserved ejection fraction (HCC) 05/01/2020   Chronic kidney disease  (CKD), active medical management without dialysis, stage 3 (moderate) (HCC) 05/01/2020   Closed torus fracture of distal end of right radius with delayed healing 08/12/2021   Delusional thoughts (HCC)    Diverticulosis of colon 05/01/2020   Epistaxis    Essential (primary) hypertension 08/18/2017   Gastroesophageal reflux disease 05/01/2020   Gastrointestinal hemorrhage    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 11/14/2019   Hypercoagulability due to atrial fibrillation (HCC) 05/01/2020   Hyperlipidemia 11/14/2019   Hypocalcemia 12/14/2020   Hypoglycemia    Hypokalemia 12/14/2020   Hypomagnesemia 12/14/2020   Hypotension 12/14/2020   Idiopathic pulmonary fibrosis (HCC) 05/01/2020   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   Iron deficiency anemia    Long term (current) use of anticoagulants    Malnutrition of mild degree [name]: 75% to less than 90% of standard weight) (HCC)    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/12/2021   Mitral and aortic incompetence 11/14/2019   Non-rheumatic atrial fibrillation (HCC) 05/01/2020   Non-toxic multinodular goiter 08/20/2020   NSVT (nonsustained ventricular tachycardia) (HCC) 08/18/2017   Oropharyngeal dysphagia    Osteoarthritis of hip 05/01/2020   Osteoarthritis of knee 05/01/2020   Osteopenia of neck of left femur    Pain of left hip joint 12/12/2017   Proteinuria 05/01/2020   Raynaud's disease    Recurrent falls 04/09/2021   Rheumatoid arthritis (HCC)    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   Septic shock (HCC) 03/10/2020   Slow transit constipation    Systemic lupus erythematosus (HCC) 05/01/2020   Thrombophilia (HCC)    Transient ischemic attack    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   Vitamin D deficiency     Current Outpatient Medications  Medication Sig Dispense Refill   acetaminophen (TYLENOL) 500 MG tablet Take 1,000 mg by mouth every 6 (six) hours as needed  (pain).     albuterol (VENTOLIN HFA) 108 (90 Base) MCG/ACT inhaler Inhale 2 puffs into the lungs every 6 (six) hours as needed for wheezing or shortness of breath.     allopurinol (ZYLOPRIM) 100 MG tablet TAKE 1 TABLET BY MOUTH EVERY DAY 90 tablet 1   amoxicillin (AMOXIL) 500 MG capsule 4 capsules by mouth 1 hour before dental procedure.     Budeson-Glycopyrrol-Formoterol (BREZTRI AEROSPHERE) 160-9-4.8 MCG/ACT AERO Inhale 2 puffs into the lungs 2 (two) times daily as needed ("for flares").     Cholecalciferol (VITAMIN D3) 25 MCG (1000 UT) CAPS Take 1,000 Units by mouth daily with lunch.     famotidine (PEPCID) 20 MG tablet Take 20 mg by mouth daily as needed for heartburn or indigestion.     ferrous sulfate 325 (65 FE) MG tablet Take 325 mg by mouth See admin instructions. Take 325 mg by mouth with breakfast and supper     fluticasone (FLONASE) 50 MCG/ACT nasal spray PLACE 1 SPRAY INTO BOTH NOSTRILS 2 (TWO) TIMES DAILY 48 mL 2   furosemide (LASIX) 40 MG tablet TAKE TABLET BY MOUTH IN THE MORNING (SCHEDULED) AND AN ADDITIONAL TABLET AT BEDTIME IF SWELLING IS PRESENT. HOLD IF SYSTOLIC READING IS <110 OR DIASTOLIC READING IS <60. 180 tablet 3   Glycerin-Hypromellose-PEG 400 (DRY EYE RELIEF DROPS OP) Place 1 drop into both eyes in the morning.     HYDROcodone-acetaminophen (NORCO/VICODIN) 5-325  MG tablet Take 1 tablet by mouth every 12 (twelve) hours as needed for moderate pain. 20 tablet 0   hydroxychloroquine (PLAQUENIL) 200 MG tablet TAKE 1 TABLET BY MOUTH EVERY DAY 90 tablet 1   levothyroxine (SYNTHROID) 25 MCG tablet Take 1 tablet (25 mcg total) by mouth daily. 90 tablet 3   magnesium oxide (MAG-OX) 400 MG tablet Take 1 tablet (400 mg total) by mouth daily. 90 tablet 2   metoprolol succinate (TOPROL-XL) 25 MG 24 hr tablet TAKE 1 TABLET (25 MG TOTAL) BY MOUTH DAILY. HOLD DOSE IF <110 SBP OR <60 DBP 90 tablet 3   Nintedanib (OFEV) 150 MG CAPS Take 150 mg by mouth at bedtime.     OLANZapine (ZYPREXA)  2.5 MG tablet TAKE 1 TABLET BY MOUTH EVERYDAY AT BEDTIME 90 tablet 0   potassium chloride (KLOR-CON M10) 10 MEQ tablet TAKE 2 TABLETS BY MOUTH DAILY 180 tablet 2   predniSONE (DELTASONE) 5 MG tablet Take 1 tablet (5 mg total) by mouth daily with breakfast. Patient to double up dose during sick day rule 100 tablet 3   Rivaroxaban (XARELTO) 15 MG TABS tablet Take 1 tablet (15 mg total) by mouth daily with supper. 90 tablet 1   No current facility-administered medications for this encounter.   Allergies  Allergen Reactions   Other Other (See Comments)    Patient is to NOT EAT any foods with husks or tree nuts, popcorn   Propofol Shortness Of Breath and Other (See Comments)    "Caused asthma"   Flecainide Other (See Comments)    Reaction not recalled   Amiodarone Other (See Comments)    Delirium/Confusion/Psychosis   Amlodipine Other (See Comments)    "Tired and syncope"   Social History   Socioeconomic History   Marital status: Legally Separated    Spouse name: Not on file   Number of children: Not on file   Years of education: 16   Highest education level: Bachelor's degree (e.g., BA, AB, BS)  Occupational History   Occupation: Retired    Comment: Community education officer business  Tobacco Use   Smoking status: Never    Passive exposure: Past   Smokeless tobacco: Never  Vaping Use   Vaping Use: Never used  Substance and Sexual Activity   Alcohol use: Never   Drug use: Never   Sexual activity: Not Currently  Other Topics Concern   Not on file  Social History Narrative   Right Handed    Social Determinants of Health   Financial Resource Strain: Not on file  Food Insecurity: No Food Insecurity (12/26/2021)   Hunger Vital Sign    Worried About Running Out of Food in the Last Year: Never true    Ran Out of Food in the Last Year: Never true  Transportation Needs: No Transportation Needs (12/26/2021)   PRAPARE - Administrator, Civil Service (Medical): No    Lack of  Transportation (Non-Medical): No  Physical Activity: Not on file  Stress: Not on file  Social Connections: Not on file  Intimate Partner Violence: Not At Risk (12/26/2021)   Humiliation, Afraid, Rape, and Kick questionnaire    Fear of Current or Ex-Partner: No    Emotionally Abused: No    Physically Abused: No    Sexually Abused: No   Family History  Problem Relation Age of Onset   Heart disease Mother    Hypertension Mother    Rheum arthritis Mother    Osteoarthritis Mother  Heart disease Father    Hypertension Father    Osteoarthritis Father    Heart disease Sister    Diabetes Brother    Cancer Brother    Vitals:   06/25/22 1539  BP: 128/66  Pulse: 70  SpO2: 97%  Weight: 55.8 kg (123 lb)   Wt Readings from Last 3 Encounters:  06/25/22 55.8 kg (123 lb)  05/03/22 56.2 kg (124 lb)  05/03/22 55.5 kg (122 lb 6.4 oz)   PHYSICAL EXAM: General:  elderly appearing.  No respiratory difficulty HEENT: normal Neck: supple. JVD ~7 cm. Carotids 2+ bilat; no bruits. No lymphadenopathy or thyromegaly appreciated. Cor: PMI nondisplaced. Regular rate & irregular rhythm. No rubs, gallops or murmurs. Lungs: clear Abdomen: soft, nontender, nondistended. No hepatosplenomegaly. No bruits or masses. Good bowel sounds. Extremities: no cyanosis, clubbing, rash, edema  Neuro: alert & oriented x 3, cranial nerves grossly intact. moves all 4 extremities w/o difficulty. Affect pleasant.    ICD interrogation. Optivol low, thoracic impedance around threshold, <1 hr a day activity. No VT/VR  ASSESSMENT & PLAN: Chronic Diastolic CHF due to valvular heart disease -s/p bioprosthetic AVR/MV ring/bioprosthetic TVR 09/25/2010 -Echo 03/11/2020  LVEF 55 to 60%, moderate to severe tricuspid regurgitation, moderately elevated right ventricular systolic pressure estimate 51mm Hg, moderate mitral regurgitation.  RV not well visualized. AV function not assessed. - RHC 11/22: Mild PAH. 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  Continue lasix 40 mg QOD - Increase metoprolol XL 25>37.5 mg daily - TTE showed potential progressive prosthetic valve stenosis. Ideally would proceed with TEE but given her overall debility currently, I worry the risk is higher than the benefit and that she would likely not be good candidate for further valvular procedures - Repeat echo at f/u - Had labs today at GI appt, will have them fax to [place]  2. Pulmonary HTN by ECHO - RHC 11/22 with mild PAH and normal PVR. (See above) - Likely due to valvular disease primarily  3. ILD - 11/2018 HRCT with stable ILD compared with 2018.  2018 CT Slowly progressive basal predominant, moderately severe pulmonary fibrosis. Favor NSIP - no visible PFT's on care everywhere - Followed by pulmonary, needs to schedule f/u.   4. OSA - resolved with weight loss and cpap discontinued  5. Permanent Afib with SSS - has ICD in place due to h/o VT  - Followed by Dr. Lalla Brothers, 04/2020 was 99% V pacing.   - On xarelto. Need to be careful with falls. May need to consider discontinuation of AC  6. H/o VT - s/p ICD   7. SLE -followed by Dr. Dimple Casey, lupus felt to be under good control -continued on HCQ and she is also on 5mg  prednisone for secondary adrenal insufficiency  Follow up 3-4 months with Dr. [name] + echo  Alen Bleacher, NP  4:03 PM 06/25/22  [facility] [address] [facility] [place] [address] [phone] (office) [phone] (fax)

## 2022-06-25 ENCOUNTER — Encounter (HOSPITAL_COMMUNITY): Payer: Self-pay

## 2022-06-25 ENCOUNTER — Ambulatory Visit (HOSPITAL_COMMUNITY)
Admission: RE | Admit: 2022-06-25 | Discharge: 2022-06-25 | Disposition: A | Discharge status: Home / Self Care | Payer: Medicare HMO | Source: Ambulatory Visit | Attending: Internal Medicine | Admitting: Internal Medicine

## 2022-06-25 VITALS — BP 128/66 | HR 70 | Wt 123.0 lb

## 2022-06-25 DIAGNOSIS — I5032 Chronic diastolic (congestive) heart failure: Secondary | ICD-10-CM | POA: Diagnosis not present

## 2022-06-25 DIAGNOSIS — J849 Interstitial pulmonary disease, unspecified: Secondary | ICD-10-CM | POA: Insufficient documentation

## 2022-06-25 DIAGNOSIS — Z953 Presence of xenogenic heart valve: Secondary | ICD-10-CM | POA: Diagnosis not present

## 2022-06-25 DIAGNOSIS — I272 Pulmonary hypertension, unspecified: Secondary | ICD-10-CM | POA: Insufficient documentation

## 2022-06-25 DIAGNOSIS — Z95810 Presence of automatic (implantable) cardiac defibrillator: Secondary | ICD-10-CM | POA: Insufficient documentation

## 2022-06-25 DIAGNOSIS — I4821 Permanent atrial fibrillation: Secondary | ICD-10-CM | POA: Diagnosis not present

## 2022-06-25 DIAGNOSIS — J449 Chronic obstructive pulmonary disease, unspecified: Secondary | ICD-10-CM | POA: Diagnosis not present

## 2022-06-25 DIAGNOSIS — I2721 Secondary pulmonary arterial hypertension: Secondary | ICD-10-CM

## 2022-06-25 DIAGNOSIS — I130 Hypertensive heart and chronic kidney disease with heart failure and stage 1 through stage 4 chronic kidney disease, or unspecified chronic kidney disease: Secondary | ICD-10-CM | POA: Insufficient documentation

## 2022-06-25 DIAGNOSIS — M3214 Glomerular disease in systemic lupus erythematosus: Secondary | ICD-10-CM | POA: Insufficient documentation

## 2022-06-25 DIAGNOSIS — Z7901 Long term (current) use of anticoagulants: Secondary | ICD-10-CM | POA: Insufficient documentation

## 2022-06-25 DIAGNOSIS — M329 Systemic lupus erythematosus, unspecified: Secondary | ICD-10-CM

## 2022-06-25 DIAGNOSIS — Z79899 Other long term (current) drug therapy: Secondary | ICD-10-CM | POA: Diagnosis not present

## 2022-06-25 DIAGNOSIS — Z8249 Family history of ischemic heart disease and other diseases of the circulatory system: Secondary | ICD-10-CM | POA: Diagnosis not present

## 2022-06-25 DIAGNOSIS — I472 Ventricular tachycardia, unspecified: Secondary | ICD-10-CM | POA: Diagnosis not present

## 2022-06-25 DIAGNOSIS — Z833 Family history of diabetes mellitus: Secondary | ICD-10-CM | POA: Insufficient documentation

## 2022-06-25 DIAGNOSIS — I495 Sick sinus syndrome: Secondary | ICD-10-CM | POA: Diagnosis not present

## 2022-06-25 DIAGNOSIS — N183 Chronic kidney disease, stage 3 unspecified: Secondary | ICD-10-CM | POA: Diagnosis not present

## 2022-06-25 DIAGNOSIS — G4733 Obstructive sleep apnea (adult) (pediatric): Secondary | ICD-10-CM | POA: Diagnosis not present

## 2022-06-25 DIAGNOSIS — I081 Rheumatic disorders of both mitral and tricuspid valves: Secondary | ICD-10-CM | POA: Insufficient documentation

## 2022-06-25 MED ORDER — METOPROLOL SUCCINATE ER 25 MG PO TB24: 25.0000 mg | ORAL_TABLET | Freq: Every day | ORAL | 3 refills | Status: DC

## 2022-06-25 MED ORDER — METOPROLOL SUCCINATE ER 25 MG PO TB24: 37.5000 mg | ORAL_TABLET | Freq: Every day | ORAL | 3 refills | Status: DC

## 2022-06-25 NOTE — Patient Instructions (Addendum)
Thank you for coming in today  If you had labs drawn today, any labs that are abnormal the clinic will call you No news is good news  Medications: INCREASE Metoprolol to 37.5 mg 1 1/2 tablet daily    Follow up appointments:  Your physician recommends that you schedule a follow-up appointment in:  4 months With Dr. [name] with echocardiogram  Your physician has requested that you have an echocardiogram. Echocardiography is a painless test that uses sound waves to create images of your heart. It provides your doctor with information about the size and shape of your heart and how well your heart's chambers and valves are working. This procedure takes approximately one hour. There are no restrictions for this procedure.      Do the following things EVERYDAY: Weigh yourself in the morning before breakfast. Write it down and keep it in a log. Take your medicines as prescribed Eat low salt foods--Limit salt (sodium) to 2000 mg per day.  Stay as active as you can everyday Limit all fluids for the day to less than 2 liters   At the [facility], you and your health needs are our priority. As part of our continuing mission to provide you with exceptional heart care, we have created designated Provider Care Teams. These Care Teams include your primary Cardiologist (physician) and Advanced Practice Providers (APPs- Physician Assistants and Nurse Practitioners) who all work together to provide you with the care you need, when you need it.   You may see any of the following providers on your designated Care Team at your next follow up: Dr Arvilla Meres Dr Marca Ancona Dr. Marcos Eke, NP Robbie Lis, [name] [facility] [place], [name] Brynda Peon, NP Karle Plumber, PharmD   Please be sure to bring in all your medications bottles to every appointment.    Thank you for choosing [facility]  If you have  any questions or concerns before your next appointment please send [place] a message through Sun or call our office at [phone].    TO LEAVE A MESSAGE FOR THE NURSE SELECT OPTION 2, PLEASE LEAVE A MESSAGE INCLUDING: YOUR NAME DATE OF BIRTH CALL BACK NUMBER REASON FOR CALL**this is important as we prioritize the call backs  YOU WILL RECEIVE A CALL BACK THE SAME DAY AS LONG AS YOU CALL BEFORE 4:00 PM

## 2022-06-29 ENCOUNTER — Other Ambulatory Visit (HOSPITAL_COMMUNITY): Payer: Self-pay

## 2022-06-29 DIAGNOSIS — I5032 Chronic diastolic (congestive) heart failure: Secondary | ICD-10-CM

## 2022-06-30 ENCOUNTER — Other Ambulatory Visit (HOSPITAL_COMMUNITY): Payer: Self-pay

## 2022-07-01 ENCOUNTER — Encounter: Payer: Self-pay | Admitting: Orthopedic Surgery

## 2022-07-01 ENCOUNTER — Other Ambulatory Visit (HOSPITAL_COMMUNITY): Payer: Medicare HMO

## 2022-07-01 ENCOUNTER — Ambulatory Visit (INDEPENDENT_AMBULATORY_CARE_PROVIDER_SITE_OTHER): Payer: Medicare HMO | Admitting: Orthopedic Surgery

## 2022-07-01 ENCOUNTER — Other Ambulatory Visit: Payer: Self-pay

## 2022-07-01 ENCOUNTER — Other Ambulatory Visit (INDEPENDENT_AMBULATORY_CARE_PROVIDER_SITE_OTHER): Payer: Medicare HMO

## 2022-07-01 ENCOUNTER — Inpatient Hospital Stay (HOSPITAL_COMMUNITY)
Admission: EM | Admit: 2022-07-01 | Discharge: 2022-07-10 | DRG: 377 | Disposition: A | Discharge status: Home Health | Payer: Medicare HMO | Attending: Internal Medicine | Admitting: Internal Medicine

## 2022-07-01 DIAGNOSIS — J84112 Idiopathic pulmonary fibrosis: Secondary | ICD-10-CM | POA: Diagnosis present

## 2022-07-01 DIAGNOSIS — S8262XA Displaced fracture of lateral malleolus of left fibula, initial encounter for closed fracture: Secondary | ICD-10-CM | POA: Diagnosis not present

## 2022-07-01 DIAGNOSIS — N1831 Chronic kidney disease, stage 3a: Secondary | ICD-10-CM | POA: Diagnosis present

## 2022-07-01 DIAGNOSIS — T45515A Adverse effect of anticoagulants, initial encounter: Secondary | ICD-10-CM | POA: Diagnosis present

## 2022-07-01 DIAGNOSIS — D649 Anemia, unspecified: Secondary | ICD-10-CM

## 2022-07-01 DIAGNOSIS — I442 Atrioventricular block, complete: Secondary | ICD-10-CM | POA: Diagnosis present

## 2022-07-01 DIAGNOSIS — Z79890 Hormone replacement therapy (postmenopausal): Secondary | ICD-10-CM

## 2022-07-01 DIAGNOSIS — Z8249 Family history of ischemic heart disease and other diseases of the circulatory system: Secondary | ICD-10-CM

## 2022-07-01 DIAGNOSIS — Z79899 Other long term (current) drug therapy: Secondary | ICD-10-CM

## 2022-07-01 DIAGNOSIS — D696 Thrombocytopenia, unspecified: Secondary | ICD-10-CM | POA: Diagnosis present

## 2022-07-01 DIAGNOSIS — I480 Paroxysmal atrial fibrillation: Secondary | ICD-10-CM | POA: Diagnosis present

## 2022-07-01 DIAGNOSIS — E785 Hyperlipidemia, unspecified: Secondary | ICD-10-CM | POA: Diagnosis present

## 2022-07-01 DIAGNOSIS — R578 Other shock: Secondary | ICD-10-CM

## 2022-07-01 DIAGNOSIS — I130 Hypertensive heart and chronic kidney disease with heart failure and stage 1 through stage 4 chronic kidney disease, or unspecified chronic kidney disease: Secondary | ICD-10-CM | POA: Diagnosis present

## 2022-07-01 DIAGNOSIS — D62 Acute posthemorrhagic anemia: Secondary | ICD-10-CM | POA: Insufficient documentation

## 2022-07-01 DIAGNOSIS — K5791 Diverticulosis of intestine, part unspecified, without perforation or abscess with bleeding: Secondary | ICD-10-CM | POA: Diagnosis not present

## 2022-07-01 DIAGNOSIS — Z7952 Long term (current) use of systemic steroids: Secondary | ICD-10-CM

## 2022-07-01 DIAGNOSIS — Z7901 Long term (current) use of anticoagulants: Secondary | ICD-10-CM

## 2022-07-01 DIAGNOSIS — N183 Chronic kidney disease, stage 3 unspecified: Secondary | ICD-10-CM | POA: Diagnosis present

## 2022-07-01 DIAGNOSIS — I959 Hypotension, unspecified: Secondary | ICD-10-CM | POA: Diagnosis present

## 2022-07-01 DIAGNOSIS — E559 Vitamin D deficiency, unspecified: Secondary | ICD-10-CM | POA: Diagnosis present

## 2022-07-01 DIAGNOSIS — E039 Hypothyroidism, unspecified: Secondary | ICD-10-CM | POA: Diagnosis present

## 2022-07-01 DIAGNOSIS — G4733 Obstructive sleep apnea (adult) (pediatric): Secondary | ICD-10-CM | POA: Diagnosis present

## 2022-07-01 DIAGNOSIS — K529 Noninfective gastroenteritis and colitis, unspecified: Secondary | ICD-10-CM | POA: Diagnosis present

## 2022-07-01 DIAGNOSIS — M329 Systemic lupus erythematosus, unspecified: Secondary | ICD-10-CM | POA: Diagnosis present

## 2022-07-01 DIAGNOSIS — Z8261 Family history of arthritis: Secondary | ICD-10-CM

## 2022-07-01 DIAGNOSIS — J449 Chronic obstructive pulmonary disease, unspecified: Secondary | ICD-10-CM | POA: Diagnosis present

## 2022-07-01 DIAGNOSIS — Z833 Family history of diabetes mellitus: Secondary | ICD-10-CM

## 2022-07-01 DIAGNOSIS — Z8673 Personal history of transient ischemic attack (TIA), and cerebral infarction without residual deficits: Secondary | ICD-10-CM

## 2022-07-01 DIAGNOSIS — Z95810 Presence of automatic (implantable) cardiac defibrillator: Secondary | ICD-10-CM

## 2022-07-01 DIAGNOSIS — I4891 Unspecified atrial fibrillation: Secondary | ICD-10-CM | POA: Diagnosis present

## 2022-07-01 DIAGNOSIS — I495 Sick sinus syndrome: Secondary | ICD-10-CM | POA: Diagnosis present

## 2022-07-01 DIAGNOSIS — Z953 Presence of xenogenic heart valve: Secondary | ICD-10-CM

## 2022-07-01 DIAGNOSIS — D6832 Hemorrhagic disorder due to extrinsic circulating anticoagulants: Secondary | ICD-10-CM | POA: Diagnosis present

## 2022-07-01 DIAGNOSIS — K922 Gastrointestinal hemorrhage, unspecified: Secondary | ICD-10-CM | POA: Diagnosis present

## 2022-07-01 DIAGNOSIS — I482 Chronic atrial fibrillation, unspecified: Secondary | ICD-10-CM | POA: Diagnosis present

## 2022-07-01 DIAGNOSIS — D6869 Other thrombophilia: Secondary | ICD-10-CM | POA: Diagnosis present

## 2022-07-01 DIAGNOSIS — I7300 Raynaud's syndrome without gangrene: Secondary | ICD-10-CM | POA: Diagnosis present

## 2022-07-01 DIAGNOSIS — E2749 Other adrenocortical insufficiency: Secondary | ICD-10-CM | POA: Diagnosis present

## 2022-07-01 DIAGNOSIS — E871 Hypo-osmolality and hyponatremia: Secondary | ICD-10-CM | POA: Diagnosis present

## 2022-07-01 DIAGNOSIS — Z888 Allergy status to other drugs, medicaments and biological substances status: Secondary | ICD-10-CM

## 2022-07-01 DIAGNOSIS — M1A9XX0 Chronic gout, unspecified, without tophus (tophi): Secondary | ICD-10-CM | POA: Diagnosis present

## 2022-07-01 DIAGNOSIS — M069 Rheumatoid arthritis, unspecified: Secondary | ICD-10-CM | POA: Diagnosis present

## 2022-07-01 DIAGNOSIS — I10 Essential (primary) hypertension: Secondary | ICD-10-CM | POA: Diagnosis present

## 2022-07-01 DIAGNOSIS — F03A0 Unspecified dementia, mild, without behavioral disturbance, psychotic disturbance, mood disturbance, and anxiety: Secondary | ICD-10-CM | POA: Diagnosis present

## 2022-07-01 DIAGNOSIS — I5032 Chronic diastolic (congestive) heart failure: Secondary | ICD-10-CM | POA: Diagnosis present

## 2022-07-01 DIAGNOSIS — K219 Gastro-esophageal reflux disease without esophagitis: Secondary | ICD-10-CM | POA: Diagnosis present

## 2022-07-01 NOTE — ED Triage Notes (Signed)
Pt via EMS for new onset diarrhea and blood in stool tonight. States had bright red bleeding in stool. Pt currently on Xarelto. Also having intermittent abdominal cramping with episodes of diarrhea.

## 2022-07-01 NOTE — Progress Notes (Signed)
Post-Op Visit Note   Patient: Lindsey Huber           Date of Birth: September 04, 1945           MRN: [id number] Visit Date: 07/01/2022 PCP: Collene Mares, PA   Assessment & Plan:  Chief Complaint:  Chief Complaint  Patient presents with   Left Ankle - Fracture, Follow-up   Visit Diagnoses:  1. Closed fracture of distal lateral malleolus of left fibula, initial encounter     Plan: Patient presents now 2 weeks out lateral malleolus fracture on the left-hand side.  Patient has been in a cast.  On examination she has mild swelling over the lateral malleolus with mild tenderness.  No tenderness medially.  No calf tenderness negative Homans.  Radiographs at this time show no change in fracture displacement.  Plan at this time is fracture boot immobilization.  Weightbearing as tolerated to start next weekend in the fracture boot.  Okay not to sleep in the fracture boot.  Would like for her to do 30 repetitions of ankle dorsiflexion plantarflexion 3 times a day just to start getting some motion.  Follow-up in 2 weeks with [facility] for repeat clinical evaluation and radiographs.  Follow-Up Instructions: No follow-ups on file.   Orders:  Orders Placed This Encounter  Procedures   XR Ankle Complete Left   No orders of the defined types were placed in this encounter.   Imaging: No results found.  PMFS History: Patient Active Problem List   Diagnosis Date Noted   Symptomatic anemia 12/26/2021   Mild dementia 08/12/2021   Allergic rhinitis    Chronic gouty arthritis    Epistaxis    Hypoglycemia    Iron deficiency anemia    Long term (current) use of anticoagulants    Malnutrition of mild degree [name]: 75% to less than 90% of standard weight)    Oropharyngeal dysphagia    Osteopenia of neck of left femur    Raynaud's disease    Rheumatoid arthritis    Transient ischemic attack    Slow transit constipation    Thrombophilia    Achalasia    Delusional thoughts    Recurrent  falls 04/09/2021   Hypotension 12/14/2020   Hypokalemia 12/14/2020   Hypocalcemia 12/14/2020   Hypomagnesemia 12/14/2020   Non-toxic multinodular goiter 08/20/2020   Acquired hypothyroidism 08/20/2020   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   Vitamin D deficiency 05/01/2020   Proteinuria 05/01/2020   Osteoarthritis of knee 05/01/2020   Osteoarthritis of hip 05/01/2020   Gastroesophageal reflux disease 05/01/2020   Idiopathic pulmonary fibrosis 05/01/2020   Cardiomyopathy 05/01/2020   Hypercoagulability due to atrial fibrillation 05/01/2020   Systemic lupus erythematosus 05/01/2020   Chronic heart failure with preserved ejection fraction 05/01/2020   Chronic kidney disease (CKD), active medical management without dialysis, stage 3 (moderate) 05/01/2020   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   Non-rheumatic atrial fibrillation 05/01/2020   Atherosclerosis of abdominal aorta 05/01/2020   Diverticulosis of colon 05/01/2020   Cholelithiasis without obstruction 05/01/2020   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   AICD (automatic cardioverter/defibrillator) present 11/14/2019   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 11/14/2019   Mitral and aortic incompetence 11/14/2019   Hyperlipidemia 11/14/2019   Pain of left hip joint 12/12/2017   Essential (primary) hypertension 08/18/2017   CHB (complete heart block) 08/18/2017   NSVT (nonsustained ventricular tachycardia) 08/18/2017   Aortic valve disorder 03/26/2002   Past Medical History:  Diagnosis Date  Achalasia    Acquired hypothyroidism 08/20/2020   Acute metabolic encephalopathy 07/25/2021   AICD (automatic cardioverter/defibrillator) present 11/14/2019   Allergic rhinitis    Ankle fracture 03/25/2022   Aortic valve disorder 03/26/2002   Atherosclerosis of abdominal aorta (HCC) 05/01/2020   Cardiomyopathy (HCC)    CHB (complete heart block) (HCC) 08/18/2017    CHF (congestive heart failure) (HCC)    Cholelithiasis without obstruction 05/01/2020   Chronic gouty arthritis    Chronic heart failure with preserved ejection fraction (HCC) 05/01/2020   Chronic kidney disease (CKD), active medical management without dialysis, stage 3 (moderate) (HCC) 05/01/2020   Closed torus fracture of distal end of right radius with delayed healing 08/12/2021   Delusional thoughts (HCC)    Diverticulosis of colon 05/01/2020   Epistaxis    Essential (primary) hypertension 08/18/2017   Gastroesophageal reflux disease 05/01/2020   Gastrointestinal hemorrhage    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 11/14/2019   Hypercoagulability due to atrial fibrillation (HCC) 05/01/2020   Hyperlipidemia 11/14/2019   Hypocalcemia 12/14/2020   Hypoglycemia    Hypokalemia 12/14/2020   Hypomagnesemia 12/14/2020   Hypotension 12/14/2020   Idiopathic pulmonary fibrosis (HCC) 05/01/2020   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   Iron deficiency anemia    Long term (current) use of anticoagulants    Malnutrition of mild degree [name]: 75% to less than 90% of standard weight) (HCC)    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/12/2021   Mitral and aortic incompetence 11/14/2019   Non-rheumatic atrial fibrillation (HCC) 05/01/2020   Non-toxic multinodular goiter 08/20/2020   NSVT (nonsustained ventricular tachycardia) (HCC) 08/18/2017   Oropharyngeal dysphagia    Osteoarthritis of hip 05/01/2020   Osteoarthritis of knee 05/01/2020   Osteopenia of neck of left femur    Pain of left hip joint 12/12/2017   Proteinuria 05/01/2020   Raynaud's disease    Recurrent falls 04/09/2021   Rheumatoid arthritis (HCC)    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   Septic shock (HCC) 03/10/2020   Slow transit constipation    Systemic lupus erythematosus (HCC) 05/01/2020   Thrombophilia (HCC)    Transient ischemic attack    Tricuspid  regurgitation 05/01/2020   Vitamin D deficiency     Family History  Problem Relation Age of Onset   Heart disease Mother    Hypertension Mother    Rheum arthritis Mother    Osteoarthritis Mother    Heart disease Father    Hypertension Father    Osteoarthritis Father    Heart disease Sister    Diabetes Brother    Cancer Brother     Past Surgical History:  Procedure Laterality Date   BREAST BIOPSY Left    CARDIAC VALVE SURGERY     CARPAL TUNNEL RELEASE     COLONOSCOPY WITH PROPOFOL N/A 12/20/2020   Procedure: COLONOSCOPY WITH PROPOFOL;  Surgeon: Willis Modena, MD;  Location: [facility] [place] ENDOSCOPY;  Service: Endoscopy;  Laterality: N/A;   HEMORRHOID SURGERY     PACEMAKER INSERTION     RIGHT/LEFT HEART CATH AND CORONARY ANGIOGRAPHY N/A 11/27/2020   Procedure: RIGHT/LEFT HEART CATH AND CORONARY ANGIOGRAPHY;  Surgeon: Dolores Patty, MD;  Location: [facility] INVASIVE CV LAB;  Service: Cardiovascular;  Laterality: N/A;   TONSILLECTOMY     Social History   Occupational History   Occupation: Retired    Comment: Community education officer business  Tobacco Use   Smoking status: Never    Passive exposure: Past   Smokeless tobacco: Never  Vaping Use   Vaping Use: Never used  Substance and Sexual Activity   Alcohol use: Never   Drug use: Never   Sexual activity: Not Currently

## 2022-07-02 ENCOUNTER — Emergency Department (HOSPITAL_COMMUNITY): Payer: Medicare HMO

## 2022-07-02 ENCOUNTER — Telehealth: Payer: Self-pay

## 2022-07-02 ENCOUNTER — Encounter (HOSPITAL_COMMUNITY): Payer: Self-pay

## 2022-07-02 DIAGNOSIS — D6869 Other thrombophilia: Secondary | ICD-10-CM | POA: Diagnosis not present

## 2022-07-02 DIAGNOSIS — D62 Acute posthemorrhagic anemia: Secondary | ICD-10-CM | POA: Diagnosis present

## 2022-07-02 DIAGNOSIS — N1831 Chronic kidney disease, stage 3a: Secondary | ICD-10-CM | POA: Diagnosis present

## 2022-07-02 DIAGNOSIS — R578 Other shock: Secondary | ICD-10-CM

## 2022-07-02 DIAGNOSIS — K922 Gastrointestinal hemorrhage, unspecified: Secondary | ICD-10-CM | POA: Diagnosis present

## 2022-07-02 DIAGNOSIS — E2749 Other adrenocortical insufficiency: Secondary | ICD-10-CM | POA: Diagnosis present

## 2022-07-02 DIAGNOSIS — N183 Chronic kidney disease, stage 3 unspecified: Secondary | ICD-10-CM | POA: Diagnosis not present

## 2022-07-02 DIAGNOSIS — E039 Hypothyroidism, unspecified: Secondary | ICD-10-CM | POA: Diagnosis present

## 2022-07-02 DIAGNOSIS — M329 Systemic lupus erythematosus, unspecified: Secondary | ICD-10-CM | POA: Diagnosis present

## 2022-07-02 DIAGNOSIS — M069 Rheumatoid arthritis, unspecified: Secondary | ICD-10-CM | POA: Diagnosis present

## 2022-07-02 DIAGNOSIS — Z953 Presence of xenogenic heart valve: Secondary | ICD-10-CM | POA: Diagnosis not present

## 2022-07-02 DIAGNOSIS — I482 Chronic atrial fibrillation, unspecified: Secondary | ICD-10-CM | POA: Diagnosis present

## 2022-07-02 DIAGNOSIS — I7300 Raynaud's syndrome without gangrene: Secondary | ICD-10-CM | POA: Diagnosis present

## 2022-07-02 DIAGNOSIS — K529 Noninfective gastroenteritis and colitis, unspecified: Secondary | ICD-10-CM | POA: Diagnosis present

## 2022-07-02 DIAGNOSIS — E871 Hypo-osmolality and hyponatremia: Secondary | ICD-10-CM | POA: Diagnosis present

## 2022-07-02 DIAGNOSIS — I480 Paroxysmal atrial fibrillation: Secondary | ICD-10-CM | POA: Diagnosis present

## 2022-07-02 DIAGNOSIS — I442 Atrioventricular block, complete: Secondary | ICD-10-CM | POA: Diagnosis present

## 2022-07-02 DIAGNOSIS — I130 Hypertensive heart and chronic kidney disease with heart failure and stage 1 through stage 4 chronic kidney disease, or unspecified chronic kidney disease: Secondary | ICD-10-CM | POA: Diagnosis present

## 2022-07-02 DIAGNOSIS — J449 Chronic obstructive pulmonary disease, unspecified: Secondary | ICD-10-CM | POA: Diagnosis present

## 2022-07-02 DIAGNOSIS — I5032 Chronic diastolic (congestive) heart failure: Secondary | ICD-10-CM | POA: Diagnosis present

## 2022-07-02 DIAGNOSIS — J84112 Idiopathic pulmonary fibrosis: Secondary | ICD-10-CM | POA: Diagnosis present

## 2022-07-02 DIAGNOSIS — F03A0 Unspecified dementia, mild, without behavioral disturbance, psychotic disturbance, mood disturbance, and anxiety: Secondary | ICD-10-CM | POA: Diagnosis present

## 2022-07-02 DIAGNOSIS — D6832 Hemorrhagic disorder due to extrinsic circulating anticoagulants: Secondary | ICD-10-CM | POA: Diagnosis present

## 2022-07-02 DIAGNOSIS — K5791 Diverticulosis of intestine, part unspecified, without perforation or abscess with bleeding: Secondary | ICD-10-CM | POA: Diagnosis present

## 2022-07-02 DIAGNOSIS — D696 Thrombocytopenia, unspecified: Secondary | ICD-10-CM | POA: Diagnosis present

## 2022-07-02 DIAGNOSIS — T45515A Adverse effect of anticoagulants, initial encounter: Secondary | ICD-10-CM | POA: Diagnosis present

## 2022-07-02 DIAGNOSIS — I495 Sick sinus syndrome: Secondary | ICD-10-CM | POA: Diagnosis present

## 2022-07-02 HISTORY — DX: Other shock: R57.8

## 2022-07-02 LAB — URINALYSIS, W/ REFLEX TO CULTURE (INFECTION SUSPECTED)
Bacteria, UA: NONE SEEN
Bilirubin Urine: NEGATIVE
Glucose, UA: NEGATIVE mg/dL
Hgb urine dipstick: NEGATIVE
Ketones, ur: NEGATIVE mg/dL
Leukocytes,Ua: NEGATIVE
Nitrite: NEGATIVE
Protein, ur: NEGATIVE mg/dL
Specific Gravity, Urine: 1.005 (ref 1.005–1.030)
pH: 7 (ref 5.0–8.0)

## 2022-07-02 LAB — CBC
HCT: 28.6 % — ABNORMAL LOW (ref 36.0–46.0)
Hemoglobin: 9.6 g/dL — ABNORMAL LOW (ref 12.0–15.0)
MCH: 28.9 pg (ref 26.0–34.0)
MCHC: 33.6 g/dL (ref 30.0–36.0)
MCV: 86.1 fL (ref 80.0–100.0)
Platelets: 81 10*3/uL — ABNORMAL LOW (ref 150–400)
RBC: 3.32 MIL/uL — ABNORMAL LOW (ref 3.87–5.11)
RDW: 23.6 % — ABNORMAL HIGH (ref 11.5–15.5)
WBC: 10.2 10*3/uL (ref 4.0–10.5)
nRBC: 0 % (ref 0.0–0.2)

## 2022-07-02 LAB — BASIC METABOLIC PANEL
Anion gap: 9 (ref 5–15)
BUN: 24 mg/dL — ABNORMAL HIGH (ref 8–23)
CO2: 22 mmol/L (ref 22–32)
Calcium: 8.5 mg/dL — ABNORMAL LOW (ref 8.9–10.3)
Chloride: 106 mmol/L (ref 98–111)
Creatinine, Ser: 1.11 mg/dL — ABNORMAL HIGH (ref 0.44–1.00)
GFR, Estimated: 52 mL/min — ABNORMAL LOW (ref >60)
Glucose, Bld: 113 mg/dL — ABNORMAL HIGH (ref 70–99)
Potassium: 3.7 mmol/L (ref 3.5–5.1)
Sodium: 137 mmol/L (ref 135–145)

## 2022-07-02 LAB — COMPREHENSIVE METABOLIC PANEL
ALT: 25 U/L (ref 0–44)
AST: 36 U/L (ref 15–41)
Albumin: 3.4 g/dL — ABNORMAL LOW (ref 3.5–5.0)
Alkaline Phosphatase: 73 U/L (ref 38–126)
Anion gap: 10 (ref 5–15)
BUN: 27 mg/dL — ABNORMAL HIGH (ref 8–23)
CO2: 24 mmol/L (ref 22–32)
Calcium: 9.1 mg/dL (ref 8.9–10.3)
Chloride: 100 mmol/L (ref 98–111)
Creatinine, Ser: 1.16 mg/dL — ABNORMAL HIGH (ref 0.44–1.00)
GFR, Estimated: 49 mL/min — ABNORMAL LOW (ref >60)
Glucose, Bld: 138 mg/dL — ABNORMAL HIGH (ref 70–99)
Potassium: 4.4 mmol/L (ref 3.5–5.1)
Sodium: 134 mmol/L — ABNORMAL LOW (ref 135–145)
Total Bilirubin: 0.6 mg/dL (ref 0.3–1.2)
Total Protein: 7.9 g/dL (ref 6.5–8.1)

## 2022-07-02 LAB — CBC WITH DIFFERENTIAL/PLATELET
Abs Immature Granulocytes: 0.02 10*3/uL (ref 0.00–0.07)
Basophils Absolute: 0 10*3/uL (ref 0.0–0.1)
Basophils Relative: 0 %
Eosinophils Absolute: 0.1 10*3/uL (ref 0.0–0.5)
Eosinophils Relative: 1 %
HCT: 32.6 % — ABNORMAL LOW (ref 36.0–46.0)
Hemoglobin: 10.5 g/dL — ABNORMAL LOW (ref 12.0–15.0)
Immature Granulocytes: 0 %
Lymphocytes Relative: 17 %
Lymphs Abs: 1.3 10*3/uL (ref 0.7–4.0)
MCH: 32.4 pg (ref 26.0–34.0)
MCHC: 32.2 g/dL (ref 30.0–36.0)
MCV: 100.6 fL — ABNORMAL HIGH (ref 80.0–100.0)
Monocytes Absolute: 0.4 10*3/uL (ref 0.1–1.0)
Monocytes Relative: 6 %
Neutro Abs: 5.8 10*3/uL (ref 1.7–7.7)
Neutrophils Relative %: 76 %
Platelets: 143 10*3/uL — ABNORMAL LOW (ref 150–400)
RBC: 3.24 MIL/uL — ABNORMAL LOW (ref 3.87–5.11)
RDW: 13.6 % (ref 11.5–15.5)
WBC: 7.6 10*3/uL (ref 4.0–10.5)
nRBC: 0 % (ref 0.0–0.2)

## 2022-07-02 LAB — GLUCOSE, CAPILLARY
Glucose-Capillary: 92 mg/dL (ref 70–99)
Glucose-Capillary: 86 mg/dL (ref 70–99)
Glucose-Capillary: 31 mg/dL — CL (ref 70–99)
Glucose-Capillary: 75 mg/dL (ref 70–99)
Glucose-Capillary: 77 mg/dL (ref 70–99)
Glucose-Capillary: 50 mg/dL — ABNORMAL LOW (ref 70–99)
Glucose-Capillary: 83 mg/dL (ref 70–99)
Glucose-Capillary: 65 mg/dL — ABNORMAL LOW (ref 70–99)
Glucose-Capillary: 81 mg/dL (ref 70–99)

## 2022-07-02 LAB — MRSA NEXT GEN BY PCR, NASAL: MRSA by PCR Next Gen: NOT DETECTED

## 2022-07-02 LAB — PROTIME-INR
INR: 3.4 — ABNORMAL HIGH (ref 0.8–1.2)
INR: 3.4 — ABNORMAL HIGH (ref 0.8–1.2)
Prothrombin Time: 34.6 seconds — ABNORMAL HIGH (ref 11.4–15.2)
Prothrombin Time: 34.9 seconds — ABNORMAL HIGH (ref 11.4–15.2)

## 2022-07-02 LAB — BPAM RBC
Blood Product Expiration Date: 202406222359
Blood Product Expiration Date: 202406222359
Blood Product Expiration Date: 202406262359
ISSUE DATE / TIME: 202406070343
Unit Type and Rh: 600

## 2022-07-02 LAB — HEMOGLOBIN A1C
Hgb A1c MFr Bld: 5.2 % (ref 4.8–5.6)
Mean Plasma Glucose: 102.54 mg/dL

## 2022-07-02 LAB — TYPE AND SCREEN
ABO/RH(D): A NEG
Unit division: 0

## 2022-07-02 LAB — HEMOGLOBIN AND HEMATOCRIT, BLOOD
HCT: 21.5 % — ABNORMAL LOW (ref 36.0–46.0)
HCT: 28.3 % — ABNORMAL LOW (ref 36.0–46.0)
HCT: 26.8 % — ABNORMAL LOW (ref 36.0–46.0)
Hemoglobin: 7 g/dL — ABNORMAL LOW (ref 12.0–15.0)
Hemoglobin: 9.5 g/dL — ABNORMAL LOW (ref 12.0–15.0)
Hemoglobin: 8.9 g/dL — ABNORMAL LOW (ref 12.0–15.0)

## 2022-07-02 LAB — PREPARE RBC (CROSSMATCH)

## 2022-07-02 LAB — MAGNESIUM: Magnesium: 1.8 mg/dL (ref 1.7–2.4)

## 2022-07-02 LAB — CBG MONITORING, ED: Glucose-Capillary: 139 mg/dL — ABNORMAL HIGH (ref 70–99)

## 2022-07-02 LAB — PHOSPHORUS: Phosphorus: 5.2 mg/dL — ABNORMAL HIGH (ref 2.5–4.6)

## 2022-07-02 MED ORDER — IOHEXOL 350 MG/ML SOLN
100.0000 mL | Freq: Once | INTRAVENOUS | Status: AC | PRN
  Administered 2022-07-02: 100 mL via INTRAVENOUS

## 2022-07-02 MED ORDER — VITAMIN K1 10 MG/ML IJ SOLN
10.0000 mg | Freq: Once | INTRAVENOUS | Status: AC
  Administered 2022-07-02 (×2): 10 mg via INTRAVENOUS
  Filled 2022-07-02 (×2): qty 1

## 2022-07-02 MED ORDER — INSULIN ASPART 100 UNIT/ML IJ SOLN
0.0000 [IU] | INTRAMUSCULAR | Status: DC
  Administered 2022-07-02 – 2022-07-03 (×2): 1 [IU] via SUBCUTANEOUS

## 2022-07-02 MED ORDER — SODIUM CHLORIDE 0.9% IV SOLUTION: Freq: Once | INTRAVENOUS | Status: AC

## 2022-07-02 MED ORDER — PANTOPRAZOLE SODIUM 40 MG IV SOLR
40.0000 mg | Freq: Two times a day (BID) | INTRAVENOUS | Status: DC
  Administered 2022-07-02 – 2022-07-06 (×9): 40 mg via INTRAVENOUS
  Filled 2022-07-02 (×9): qty 10

## 2022-07-02 MED ORDER — VITAMIN K1 10 MG/ML IJ SOLN
10.0000 mg | Freq: Once | INTRAVENOUS | Status: AC
  Administered 2022-07-02: 10 mg via INTRAVENOUS
  Filled 2022-07-02: qty 1

## 2022-07-02 MED ORDER — DEXTROSE IN LACTATED RINGERS 5 % IV SOLN: INTRAVENOUS | Status: DC

## 2022-07-02 MED ORDER — DOCUSATE SODIUM 100 MG PO CAPS: 100.0000 mg | ORAL_CAPSULE | Freq: Two times a day (BID) | ORAL | Status: DC | PRN

## 2022-07-02 MED ORDER — UMECLIDINIUM BROMIDE 62.5 MCG/ACT IN AEPB
1.0000 | INHALATION_SPRAY | Freq: Every day | RESPIRATORY_TRACT | Status: DC
  Administered 2022-07-02 – 2022-07-10 (×8): 1 via RESPIRATORY_TRACT
  Filled 2022-07-02 (×2): qty 7

## 2022-07-02 MED ORDER — NOREPINEPHRINE 4 MG/250ML-% IV SOLN
0.0000 ug/min | INTRAVENOUS | Status: DC
  Administered 2022-07-02: 5 ug/min via INTRAVENOUS
  Filled 2022-07-02: qty 250

## 2022-07-02 MED ORDER — SODIUM CHLORIDE 0.9 % IV BOLUS
500.0000 mL | Freq: Once | INTRAVENOUS | Status: AC
  Administered 2022-07-02: 500 mL via INTRAVENOUS

## 2022-07-02 MED ORDER — MOMETASONE FURO-FORMOTEROL FUM 200-5 MCG/ACT IN AERO
2.0000 | INHALATION_SPRAY | Freq: Two times a day (BID) | RESPIRATORY_TRACT | Status: DC
  Administered 2022-07-02 – 2022-07-10 (×17): 2 via RESPIRATORY_TRACT
  Filled 2022-07-02 (×2): qty 8.8

## 2022-07-02 MED ORDER — CALCIUM GLUCONATE-NACL 2-0.675 GM/100ML-% IV SOLN
2.0000 g | Freq: Once | INTRAVENOUS | Status: AC
  Administered 2022-07-02: 2000 mg via INTRAVENOUS
  Filled 2022-07-02: qty 100

## 2022-07-02 MED ORDER — POLYETHYLENE GLYCOL 3350 17 G PO PACK: 17.0000 g | PACK | Freq: Every day | ORAL | Status: DC | PRN

## 2022-07-02 MED ORDER — LACTATED RINGERS IV SOLN: INTRAVENOUS | Status: DC

## 2022-07-02 MED ORDER — CHLORHEXIDINE GLUCONATE CLOTH 2 % EX PADS
6.0000 | MEDICATED_PAD | Freq: Every day | CUTANEOUS | Status: DC
  Administered 2022-07-02 – 2022-07-10 (×8): 6 via TOPICAL

## 2022-07-02 MED ORDER — PROTHROMBIN COMPLEX CONC HUMAN 500 UNITS IV KIT
2654.0000 [IU] | PACK | Status: AC
  Administered 2022-07-02: 2654 [IU] via INTRAVENOUS
  Filled 2022-07-02: qty 2654

## 2022-07-02 MED ORDER — ORAL CARE MOUTH RINSE: 15.0000 mL | OROMUCOSAL | Status: DC | PRN

## 2022-07-02 MED ORDER — BUDESON-GLYCOPYRROL-FORMOTEROL 160-9-4.8 MCG/ACT IN AERO: 2.0000 | INHALATION_SPRAY | Freq: Two times a day (BID) | RESPIRATORY_TRACT | Status: DC | PRN

## 2022-07-02 NOTE — Consult Note (Signed)
Vascular and Interventional Radiology  On Call Phone Note  Patient: Lindsey Huber DOB: 02-12-1945 Medical Record Number: [id number] Note Date/Time: 07/02/22 2:58 AM   Admitting Diagnosis: gi bleed   Assessment: 77 y.o. year old female whom ER MD, Dr Clayborne Dana, reached out to VIR On Call Physician with concern for GIB.   Pt is comorbid including valvular repair and arrhythmia on xarelto. Pt presented to ER at [facility] with BRBPR. VSS / HDS.  CTA demonstrating extravasation at descending colon c/w a diverticular bleed. VIR consulted for evaluation.  On chart review, prior acute GIB in 12/13/2020. Managed with pRBC transfusion and AC hold.  CTA AP, 07/02/22 Independently reviewed, demonstrating acute LGIB at descending colon    PT / INR; 34.6 and 3.4   Plan: Recommend aggressive AC reversal, INR goal <= 1.5 No acute VIR intervention at this time. Please reach out to VIR On Call should the Pt acutely decompensate. GI and Surgical follow up, in the setting of diverticulosis and recurrent GIB if Pt remains on [facility] for cardiovascular risk management   As part of this Telephone encounter, no in-person exam was conducted.  The patient was physically located in [place] or a state in which I am permitted to provide care. The encounter was reasonable and appropriate under the circumstances given the patient's presentation at the time.  Roanna Banning, MD [facility] Rad[facility]   Pager. [phone] Clinic. [phone]

## 2022-07-02 NOTE — Consult Note (Signed)
Chief Complaint: Patient was seen in consultation today for GI bleed at the request of CCM  Referring Physician(s): CCM  Supervising Physician: Gilmer Mor  Patient Status: [facility] - In-pt  History of Present Illness: Lindsey Huber is a 77 y.o. female who presented to [facility] ED 07/01/22 with abdominal cramping with bright red blood in stool. CT Angio GI bleed demonstrated:  IMPRESSION: 1. Active GI bleed at the descending colon/sigmoid colon junction. This likely represents active diverticular hemorrhage. 2. Enteritis involving the jejunum in the left upper quadrant. 3. Small hematoma in the right gluteal fat without evidence of active bleeding.  Dr. Milford Cage, IR, was consulted and recommended aggressive AC reversal with INR goal <= 1.5. No IR intervention required at that time but to reach out to VIR if patient were to acutely decompensate. Pt was admitted and followed by CCM.   Pt denies fever, abdominal pain or dizziness.  She endorses chills, fatigue, SOB (baseline), CP (baseline), blood in stool (today), N/V, HA and weakness.  She denies pain at this time.   Past Medical History:  Diagnosis Date   Achalasia    Acquired hypothyroidism 08/20/2020   Acute metabolic encephalopathy 07/25/2021   AICD (automatic cardioverter/defibrillator) present 11/14/2019   Allergic rhinitis    Ankle fracture 03/25/2022   Aortic valve disorder 03/26/2002   Atherosclerosis of abdominal aorta (HCC) 05/01/2020   Cardiomyopathy (HCC)    CHB (complete heart block) (HCC) 08/18/2017   CHF (congestive heart failure) (HCC)    Cholelithiasis without obstruction 05/01/2020   Chronic gouty arthritis    Chronic heart failure with preserved ejection fraction (HCC) 05/01/2020   Chronic kidney disease (CKD), active medical management without dialysis, stage 3 (moderate) (HCC) 05/01/2020   Closed torus fracture of distal end of right radius with delayed healing 08/12/2021   Delusional thoughts (HCC)     Diverticulosis of colon 05/01/2020   Epistaxis    Essential (primary) hypertension 08/18/2017   Gastroesophageal reflux disease 05/01/2020   Gastrointestinal hemorrhage    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 11/14/2019   Hypercoagulability due to atrial fibrillation (HCC) 05/01/2020   Hyperlipidemia 11/14/2019   Hypocalcemia 12/14/2020   Hypoglycemia    Hypokalemia 12/14/2020   Hypomagnesemia 12/14/2020   Hypotension 12/14/2020   Idiopathic pulmonary fibrosis (HCC) 05/01/2020   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   Iron deficiency anemia    Long term (current) use of anticoagulants    Malnutrition of mild degree [name]: 75% to less than 90% of standard weight) (HCC)    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/12/2021   Mitral and aortic incompetence 11/14/2019   Non-rheumatic atrial fibrillation (HCC) 05/01/2020   Non-toxic multinodular goiter 08/20/2020   NSVT (nonsustained ventricular tachycardia) (HCC) 08/18/2017   Oropharyngeal dysphagia    Osteoarthritis of hip 05/01/2020   Osteoarthritis of knee 05/01/2020   Osteopenia of neck of left femur    Pain of left hip joint 12/12/2017   Proteinuria 05/01/2020   Raynaud's disease    Recurrent falls 04/09/2021   Rheumatoid arthritis (HCC)    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   Septic shock (HCC) 03/10/2020   Slow transit constipation    Systemic lupus erythematosus (HCC) 05/01/2020   Thrombophilia (HCC)    Transient ischemic attack    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   Vitamin D deficiency     Past Surgical History:  Procedure Laterality Date   BREAST BIOPSY Left    CARDIAC VALVE SURGERY     CARPAL  TUNNEL RELEASE     COLONOSCOPY WITH PROPOFOL N/A 12/20/2020   Procedure: COLONOSCOPY WITH PROPOFOL;  Surgeon: Willis Modena, MD;  Location[facility] ENDOSCOPY;  Service: Endoscopy;  Laterality: N/A;   HEMORRHOID SURGERY     PACEMAKER INSERTION      RIGHT/LEFT HEART CATH AND CORONARY ANGIOGRAPHY N/A 11/27/2020   Procedure: RIGHT/LEFT HEART CATH AND CORONARY ANGIOGRAPHY;  Surgeon: Dolores Patty, MD;  Location: [facility] INVASIVE CV LAB;  Service: Cardiovascular;  Laterality: N/A;   TONSILLECTOMY      Allergies: Other, Propofol, Flecainide, Amiodarone, and Amlodipine  Medications: Prior to Admission medications   Medication Sig Start Date End Date Taking? Authorizing Provider  acetaminophen (TYLENOL) 500 MG tablet Take 1,000 mg by mouth every 6 (six) hours as needed (pain).   Yes [provider]  albuterol (VENTOLIN HFA) 108 (90 Base) MCG/ACT inhaler Inhale 2 puffs into the lungs every 6 (six) hours as needed for wheezing or shortness of breath.   Yes [provider]  allopurinol (ZYLOPRIM) 100 MG tablet TAKE 1 TABLET BY MOUTH EVERY DAY 06/01/22  Yes Rice, Jamesetta Orleans, MD  amoxicillin (AMOXIL) 500 MG capsule 4 capsules by mouth 1 hour before dental procedure. 12/30/21  Yes [provider]  Budeson-Glycopyrrol-Formoterol (BREZTRI AEROSPHERE) 160-9-4.8 MCG/ACT AERO Inhale 2 puffs into the lungs 2 (two) times daily as needed ("for flares").   Yes [provider]  Cholecalciferol (VITAMIN D3) 25 MCG (1000 UT) CAPS Take 1,000 Units by mouth daily with lunch.   Yes [provider]  famotidine (PEPCID) 20 MG tablet Take 20 mg by mouth daily as needed for heartburn or indigestion. 07/03/21  Yes [provider]  fluticasone (FLONASE) 50 MCG/ACT nasal spray PLACE 1 SPRAY INTO BOTH NOSTRILS 2 (TWO) TIMES DAILY 05/11/22  Yes Hunsucker, Lesia Sago, MD  furosemide (LASIX) 40 MG tablet TAKE TABLET BY MOUTH IN THE MORNING (SCHEDULED) AND AN ADDITIONAL TABLET AT BEDTIME IF SWELLING IS PRESENT. HOLD IF SYSTOLIC READING IS <110 OR DIASTOLIC READING IS <60. 02/25/22  Yes Sheilah Pigeon, PA-C  Glycerin-Hypromellose-PEG 400 (DRY EYE RELIEF DROPS OP) Place 1 drop into both eyes in the morning.   Yes [provider]  hydroxychloroquine (PLAQUENIL) 200 MG tablet TAKE 1 TABLET BY MOUTH EVERY DAY 03/29/22  Yes Rice, Jamesetta Orleans, MD  levothyroxine (SYNTHROID) 25 MCG tablet Take 1 tablet (25 mcg total) by mouth daily. 11/06/21  Yes Shamleffer, Konrad Dolores, MD  magnesium oxide (MAG-OX) 400 MG tablet Take 1 tablet (400 mg total) by mouth daily. 11/27/21  Yes Sheilah Pigeon, PA-C  metoprolol succinate (TOPROL-XL) 25 MG 24 hr tablet Take 1.5 tablets (37.5 mg total) by mouth daily. Hold dose if <110 SBP or <60 DBP 06/25/22  Yes Diaz, Alma L, NP  Nintedanib (OFEV) 150 MG CAPS Take 150 mg by mouth at bedtime.   Yes [provider]  OLANZapine (ZYPREXA) 2.5 MG tablet TAKE 1 TABLET BY MOUTH EVERYDAY AT BEDTIME 06/01/22  Yes Jaffe, Adam R, DO  potassium chloride (KLOR-CON M10) 10 MEQ tablet TAKE 2 TABLETS BY MOUTH DAILY 06/01/22  Yes Sheilah Pigeon, PA-C  predniSONE (DELTASONE) 5 MG tablet Take 1 tablet (5 mg total) by mouth daily with breakfast. Patient to double up dose during sick day rule 11/06/21  Yes Shamleffer, Konrad Dolores, MD  Rivaroxaban (XARELTO) 15 MG TABS tablet Take 1 tablet (15 mg total) by mouth daily with supper. 01/04/22  Yes Sheilah Pigeon, PA-C     Family History  Problem Relation Age of Onset   Heart disease Mother    Hypertension Mother    Rheum arthritis Mother    Osteoarthritis Mother    Heart disease Father    Hypertension Father    Osteoarthritis Father    Heart disease Sister    Diabetes Brother    Cancer Brother     Social History   Socioeconomic History   Marital status: Legally Separated    Spouse name: Not on file   Number of children: Not on file   Years of education: 16   Highest education level: Bachelor's degree (e.g., BA, AB, BS)  Occupational History   Occupation: Retired    Comment: Community education officer business  Tobacco Use   Smoking status: Never    Passive exposure: Past   Smokeless tobacco: Never  Vaping Use   Vaping Use: Never used   Substance and Sexual Activity   Alcohol use: Never   Drug use: Never   Sexual activity: Not Currently  Other Topics Concern   Not on file  Social History Narrative   Right Handed    Social Determinants of Health   Financial Resource Strain: Not on file  Food Insecurity: No Food Insecurity (12/26/2021)   Hunger Vital Sign    Worried About Running Out of Food in the Last Year: Never true    Ran Out of Food in the Last Year: Never true  Transportation Needs: No Transportation Needs (12/26/2021)   PRAPARE - Administrator, Civil Service (Medical): No    Lack of Transportation (Non-Medical): No  Physical Activity: Not on file  Stress: Not on file  Social Connections: Not on file     Review of Systems: A 12 point ROS discussed and pertinent positives are indicated in the HPI above.  All other systems are negative.  Review of Systems  Constitutional:  Positive for chills and fatigue. Negative for fever.  Respiratory:  Positive for shortness of breath.   Cardiovascular:  Positive for chest pain.  Gastrointestinal:  Positive for blood in stool, nausea and vomiting. Negative for abdominal pain.  Neurological:  Positive for weakness and headaches. Negative for dizziness.    Vital Signs: BP 115/72   Pulse 70   Temp 97.7 F (36.5 C) (Oral)   Resp (!) 21   Ht 5\' 3"  (1.6 m)   Wt 123 lb (55.8 kg)   SpO2 100%   BMI 21.79 kg/m   Advance Care Plan: The advanced care plan/surrogate decision maker was discussed at the time of visit and documented in the medical record.  Pt is a FULL CODE  Physical Exam Vitals reviewed.  Constitutional:      General: She is not in acute distress.    Appearance: She is ill-appearing.  HENT:     Head: Normocephalic and atraumatic.     Mouth/Throat:     Mouth: Mucous membranes are dry.     Pharynx: Oropharynx is clear.  Eyes:     Extraocular Movements: Extraocular movements intact.     Pupils: Pupils are equal, round, and reactive to  light.  Cardiovascular:     Rate and Rhythm: Normal rate and regular rhythm.  Pulmonary:     Effort: Pulmonary effort is normal. No respiratory distress.     Breath sounds: Normal breath sounds.  Abdominal:     General: Bowel sounds are normal. There is no distension.     Palpations: Abdomen is soft.     Tenderness: There is no abdominal  tenderness. There is no guarding.  Musculoskeletal:     Right lower leg: No edema.     Left lower leg: No edema.  Skin:    General: Skin is warm and dry.  Neurological:     Mental Status: She is alert and oriented to person, place, and time.  Psychiatric:        Mood and Affect: Mood normal.        Behavior: Behavior normal.        Thought Content: Thought content normal.        Judgment: Judgment normal.     Imaging: CT ANGIO GI BLEED  Result Date: 07/02/2022 CLINICAL DATA:  Mesenteric ischemia. Diarrhea and blood in stool. On Xarelto. Abdominal cramping. EXAM: CTA ABDOMEN AND PELVIS WITHOUT AND WITH CONTRAST TECHNIQUE: Multidetector CT imaging of the abdomen and pelvis was performed using the standard protocol during bolus administration of intravenous contrast. Multiplanar reconstructed images and MIPs were obtained and reviewed to evaluate the vascular anatomy. RADIATION DOSE REDUCTION: This exam was performed according to the departmental dose-optimization program which includes automated exposure control, adjustment of the mA and/or kV according to patient size and/or use of iterative reconstruction technique. CONTRAST:  OMNIPAQUE IOHEXOL 350 MG/ML SOLN COMPARISON:  CT 12/13/2020 FINDINGS: VASCULAR Aorta: Mild aortic atherosclerotic calcification. No aneurysm or dissection. Celiac: Calcified plaque at the origin causes moderate narrowing. No aneurysm or dissection. SMA: Patent without aneurysm or dissection. Renals: Single right and 2 left renal arteries. Patent without aneurysm or dissection. IMA: Patent. Inflow: Patent without aneurysm or  dissection. Proximal Outflow: Patent without aneurysm or dissection. Veins: Patent portal vein and IVC. Review of the MIP images confirms the above findings. NON-VASCULAR Lower chest: Cardiomegaly. Cardiac valve prostheses. Interstitial lung disease in the lung bases. Hepatobiliary: Cholelithiasis without evidence of cholecystitis. Unremarkable liver and biliary tree. Pancreas: Unremarkable. Spleen: Unremarkable. Adrenals/Urinary Tract: Stable adrenal glands. No urinary calculi or hydronephrosis. Unremarkable bladder. Stomach/Bowel: Intraluminal hyperdensity on arterial phase images which diffuses on portal venous phase near the descending colon/sigmoid colon junction (series 6/image 93 and 12/55) compatible with active GI bleed. Mild distention of the rectum and sigmoid colon downstream from the area of active bleeding with heterogenous intraluminal fluid compatible with hemorrhage and stool. Sigmoid colon and descending colon diverticulosis without diverticulitis. The jejunum in the left upper quadrant is distended with fluid and demonstrates wall thickening and hyperenhancement. There is smooth tapering distally without abrupt transition point to suggest obstruction. Findings are suggestive of enteritis. Lymphatic: No lymphadenopathy. Reproductive: Calcified fibroids in the uterus.  No adnexal mass. Other: No free intraperitoneal fluid or air. Musculoskeletal: No acute fracture. Grade 2 anterolisthesis of L4 on L5 with advanced disc space height loss and degenerative endplate changes at L4-L5. Chronic compression deformity of L3 is unchanged. Intermediate density irregular fluid collection in the right posterolateral gluteal soft tissues (circa series 6/image 128) measuring 3.1 x 1.9 cm is suspicious for hematoma. No evidence of active bleeding. IMPRESSION: 1. Active GI bleed at the descending colon/sigmoid colon junction. This likely represents active diverticular hemorrhage. 2. Enteritis involving the jejunum  in the left upper quadrant. 3. Small hematoma in the right gluteal fat without evidence of active bleeding. Aortic Atherosclerosis (ICD10-I70.0). Critical Value/emergent results were called by telephone at the time of interpretation on 07/02/2022 at 2:30 am to provider [facility] , who verbally acknowledged these results. Electronically Signed   By: Minerva Fester M.D.   On: 07/02/2022 02:33   XR Ankle Complete Left  Result  Date: 07/01/2022 AP lateral mortise radiographs left ankle reviewed.  Lateral malleolar fracture unchanged with slight displacement but minimal shortening.  Mortise is symmetric.  Some callus formation is present but it is minimal.  CUP PACEART REMOTE DEVICE CHECK  Result Date: 06/22/2022 Scheduled remote reviewed. Normal device function.  Next remote 91 days. LA, CVRS  XR Ankle Complete Left  Result Date: 06/20/2022 AP, oblique, lateral views of the left ankle reviewed.  Ankle mortise well-maintained.  No further displacement at the fracture site.  No callus formation noted yet.  No new fracture or dislocation.  XR Knee 1-2 Views Left  Result Date: 06/20/2022 AP and lateral views of left knee reviewed.  No fracture or dislocation noted.  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  No abnormal patellar height  XR Ankle Complete Left  Result Date: 06/11/2022 AP, oblique, lateral views of left ankle reviewed.  Lateral malleolus fracture noted with ankle mortise overall maintained.  There is no medial malleolus fracture.  No lateral process of the talus fracture or anterior calcaneus process fracture.  No significant abnormality in regards to these amount of tibiofibular overlap or medial clear space widening.  No significant change in position of the fracture compared with radiographs from 5/10.   Labs:  CBC: Recent Labs    12/28/21 0052 12/28/21 0850 12/29/21 0459 05/03/22 1519 07/01/22 2349 07/02/22 0323  WBC  5.5  --  5.9 6.6 7.6  --   HGB 9.8*   < > 9.7* 11.4* 10.5* 7.0*  HCT 29.6*   < > 29.8* 34.6* 32.6* 21.5*  PLT 173  --  175 155 143*  --    < > = values in this interval not displayed.    COAGS: Recent Labs    07/01/22 2349  INR 3.4*    BMP: Recent Labs    12/27/21 0109 12/28/21 0052 12/29/21 0459 01/04/22 1636 05/03/22 1519 07/01/22 2349  NA 136 137 136  --  141 134*  K 3.8 3.9 3.6 4.7 4.3 4.4  CL 108 105 105  --  104 100  CO2 21* 24 23  --  29 24  GLUCOSE 135* 127* 86  --  84 138*  BUN 35* 33* 31*  --  28* 27*  CALCIUM 8.6* 8.1* 8.3*  --  9.2 9.1  CREATININE 1.14* 1.46* 1.04*  --  1.11* 1.16*  GFRNONAA 51* 38* 56*  --   --  49*    LIVER FUNCTION TESTS: Recent Labs    12/28/21 0052 12/29/21 0459 05/03/22 0848 05/03/22 1519 07/01/22 2349  BILITOT 2.3* 1.6* 0.6 0.6 0.6  AST 28 28 29 28  36  ALT 15 16 15 14 25   ALKPHOS 47 48 48  --  73  PROT 7.4 7.1 7.7 7.6 7.9  ALBUMIN 3.1* 2.7* 3.8  --  3.4*    TUMOR MARKERS: No results for input(s): "AFPTM", "CEA", "CA199", "CHROMGRNA" in the last 8760 hours.  Assessment and Plan:  77 yo female with PMHx of chronic HFpEF, lupus, COPD, pulmonary fibrosis, AICD, SSS with PPM, OSA, A-fib on Xarelto, aortic, tricuspid and mitral valve replacement, IDA, diverticulosis of colon and TIA consulted for possible IR intervention for GI bleeding if anticoagulation reversal is not successful.   Pt resting in bed with daughter at bedside.  She is A&O, calm and pleasant.  She is in no distress.  Hgb 9.6 (7.0) INR 3.4 VSS  The Risks and benefits of embolization were discussed with the  patient including, but not limited to bleeding, infection, vascular injury, post operative pain, or contrast induced renal failure.  This procedure involves the use of X-rays and because of the nature of the planned procedure, it is possible that we will have prolonged use of X-ray fluoroscopy.  Potential radiation risks to you include (but are not  limited to) the following: - A slightly elevated risk for cancer several years later in life. This risk is typically less than 0.5% percent. This risk is low in comparison to the normal incidence of human cancer, which is 33% for women and 50% for men according to the [employer] Society. - Radiation induced injury can include skin redness, resembling a rash, tissue breakdown / ulcers and hair loss (which can be temporary or permanent).   The likelihood of either of these occurring depends on the difficulty of the procedure and whether you are sensitive to radiation due to previous procedures, disease, or genetic conditions.   IF your procedure requires a prolonged use of radiation, you will be notified and given written instructions for further action.  It is your responsibility to monitor the irradiated area for the 2 weeks following the procedure and to notify your physician if you are concerned that you have suffered a radiation induced injury.    All of the patient's questions were answered, patient is agreeable to proceed. Consent signed and in chart.  Thank you for this interesting consult.  I greatly enjoyed meeting Ayannah Faddis and look forward to participating in their care.  A copy of this report was sent to the requesting provider on this date.  Electronically Signed: Shon Hough, NP 07/02/2022, 10:37 AM   I spent a total of 20 minutes in face to face in clinical consultation, greater than 50% of which was counseling/coordinating care for GIB.

## 2022-07-02 NOTE — Progress Notes (Signed)
CBG 31, pt alert and oriented, does not report symptoms.  rechecked on another finger CBG 75.

## 2022-07-02 NOTE — Consult Note (Signed)
[facility] Gastroenterology Consult  Referring Provider: No ref. provider found Primary Care Physician:  Collene Mares, [name] Primary Gastroenterologist: [facility] (Dr. Kerin Salen)  Reason for Consultation: hematochezia  SUBJECTIVE:   HPI: Lindsey Huber is a 77 y.o. female with past medical history significant for achalasia (based on barium swallow June 2022, no ongoing dysphagia per last outpatient GI note), complete heart block status post AICD, heart failure with preserved ejection fraction, hypertension, atrial fibrillation (on xarelto), idiopathic pulmonary fibrosis, SLE, rheumatoid arthritis.   Presented to hospital for bright red blood per rectum, painless. Has been having some left lower quadrant pain currently. No chest pain. Some shortness of breath. Having nausea, no vomiting. Bleeding began yesterday evening around 1900. On presentation, Hgb dropped to 7.0 from 10.5 (was 11.  2 on 06/25/2022) status post 2 units PRBC, INR 3.4 (has received Kcentra and vitamin K).  CT angiogram GI bleed showed descending colon extravasation, interventional radiology team recommended INR reversal.  Repeat INR level is pending.  At bedside today, patient had blood into urinary catheter.  Patient was turned with nursing staff present and bright red blood clots were seen per rectum.  Patient is currently on vasopressor therapy.  12/13/2020 for hematochezia (Dr. Dulce Sellar) showed hemorrhoids, descending and sigmoid diverticulosis, no fresh blood.  EGD completed in [place] 07/16/2020 for dysphagia (Dr. Nile Riggs) showed normal esophagus, Z-line at 40 cm, normal stomach, normal duodenum.  Pathology showed inflamed cardiac mucosa.  Past Medical History:  Diagnosis Date   Achalasia    Acquired hypothyroidism 08/20/2020   Acute metabolic encephalopathy 07/25/2021   AICD (automatic cardioverter/defibrillator) present 11/14/2019   Allergic rhinitis    Ankle fracture 03/25/2022   Aortic valve disorder 03/26/2002    Atherosclerosis of abdominal aorta (HCC) 05/01/2020   Cardiomyopathy (HCC)    CHB (complete heart block) (HCC) 08/18/2017   CHF (congestive heart failure) (HCC)    Cholelithiasis without obstruction 05/01/2020   Chronic gouty arthritis    Chronic heart failure with preserved ejection fraction (HCC) 05/01/2020   Chronic kidney disease (CKD), active medical management without dialysis, stage 3 (moderate) (HCC) 05/01/2020   Closed torus fracture of distal end of right radius with delayed healing 08/12/2021   Delusional thoughts (HCC)    Diverticulosis of colon 05/01/2020   Epistaxis    Essential (primary) hypertension 08/18/2017   Gastroesophageal reflux disease 05/01/2020   Gastrointestinal hemorrhage    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 11/14/2019   Hypercoagulability due to atrial fibrillation (HCC) 05/01/2020   Hyperlipidemia 11/14/2019   Hypocalcemia 12/14/2020   Hypoglycemia    Hypokalemia 12/14/2020   Hypomagnesemia 12/14/2020   Hypotension 12/14/2020   Idiopathic pulmonary fibrosis (HCC) 05/01/2020   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   Iron deficiency anemia    Long term (current) use of anticoagulants    Malnutrition of mild degree [name]: 75% to less than 90% of standard weight) (HCC)    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/12/2021   Mitral and aortic incompetence 11/14/2019   Non-rheumatic atrial fibrillation (HCC) 05/01/2020   Non-toxic multinodular goiter 08/20/2020   NSVT (nonsustained ventricular tachycardia) (HCC) 08/18/2017   Oropharyngeal dysphagia    Osteoarthritis of hip 05/01/2020   Osteoarthritis of knee 05/01/2020   Osteopenia of neck of left femur    Pain of left hip joint 12/12/2017   Proteinuria 05/01/2020   Raynaud's disease    Recurrent falls 04/09/2021   Rheumatoid arthritis (HCC)    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  Septic shock (HCC) 03/10/2020   Slow transit  constipation    Systemic lupus erythematosus (HCC) 05/01/2020   Thrombophilia (HCC)    Transient ischemic attack    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   Vitamin D deficiency    Past Surgical History:  Procedure Laterality Date   BREAST BIOPSY Left    CARDIAC VALVE SURGERY     CARPAL TUNNEL RELEASE     COLONOSCOPY WITH PROPOFOL N/A 12/20/2020   Procedure: COLONOSCOPY WITH PROPOFOL;  Surgeon: Willis Modena, MD;  Location: [facility] ENDOSCOPY;  Service: Endoscopy;  Laterality: N/A;   HEMORRHOID SURGERY     PACEMAKER INSERTION     RIGHT/LEFT HEART CATH AND CORONARY ANGIOGRAPHY N/A 11/27/2020   Procedure: RIGHT/LEFT HEART CATH AND CORONARY ANGIOGRAPHY;  Surgeon: Dolores Patty, MD;  Location: [facility] INVASIVE CV LAB;  Service: Cardiovascular;  Laterality: N/A;   TONSILLECTOMY     Prior to Admission medications   Medication Sig Start Date End Date Taking? Authorizing Provider  acetaminophen (TYLENOL) 500 MG tablet Take 1,000 mg by mouth every 6 (six) hours as needed (pain).   Yes [provider]  albuterol (VENTOLIN HFA) 108 (90 Base) MCG/ACT inhaler Inhale 2 puffs into the lungs every 6 (six) hours as needed for wheezing or shortness of breath.   Yes [provider]  allopurinol (ZYLOPRIM) 100 MG tablet TAKE 1 TABLET BY MOUTH EVERY DAY 06/01/22  Yes Rice, Jamesetta Orleans, MD  amoxicillin (AMOXIL) 500 MG capsule 4 capsules by mouth 1 hour before dental procedure. 12/30/21  Yes [provider]  Budeson-Glycopyrrol-Formoterol (BREZTRI AEROSPHERE) 160-9-4.8 MCG/ACT AERO Inhale 2 puffs into the lungs 2 (two) times daily as needed ("for flares").   Yes [provider]  Cholecalciferol (VITAMIN D3) 25 MCG (1000 UT) CAPS Take 1,000 Units by mouth daily with lunch.   Yes [provider]  famotidine (PEPCID) 20 MG tablet Take 20 mg by mouth daily as needed for heartburn or indigestion. 07/03/21  Yes [provider]  fluticasone (FLONASE) 50 MCG/ACT nasal spray  PLACE 1 SPRAY INTO BOTH NOSTRILS 2 (TWO) TIMES DAILY 05/11/22  Yes Hunsucker, Lesia Sago, MD  furosemide (LASIX) 40 MG tablet TAKE TABLET BY MOUTH IN THE MORNING (SCHEDULED) AND AN ADDITIONAL TABLET AT BEDTIME IF SWELLING IS PRESENT. HOLD IF SYSTOLIC READING IS <110 OR DIASTOLIC READING IS <60. 02/25/22  Yes Sheilah Pigeon, PA-C  Glycerin-Hypromellose-PEG 400 (DRY EYE RELIEF DROPS OP) Place 1 drop into both eyes in the morning.   Yes [provider]  hydroxychloroquine (PLAQUENIL) 200 MG tablet TAKE 1 TABLET BY MOUTH EVERY DAY 03/29/22  Yes Rice, Jamesetta Orleans, MD  levothyroxine (SYNTHROID) 25 MCG tablet Take 1 tablet (25 mcg total) by mouth daily. 11/06/21  Yes Shamleffer, Konrad Dolores, MD  magnesium oxide (MAG-OX) 400 MG tablet Take 1 tablet (400 mg total) by mouth daily. 11/27/21  Yes Sheilah Pigeon, PA-C  metoprolol succinate (TOPROL-XL) 25 MG 24 hr tablet Take 1.5 tablets (37.5 mg total) by mouth daily. Hold dose if <110 SBP or <60 DBP 06/25/22  Yes Diaz, Alma L, NP  Nintedanib (OFEV) 150 MG CAPS Take 150 mg by mouth at bedtime.   Yes [provider]  OLANZapine (ZYPREXA) 2.5 MG tablet TAKE 1 TABLET BY MOUTH EVERYDAY AT BEDTIME 06/01/22  Yes Jaffe, Adam R, DO  potassium chloride (KLOR-CON M10) 10 MEQ tablet TAKE 2 TABLETS BY MOUTH DAILY 06/01/22  Yes Sheilah Pigeon, PA-C  predniSONE (DELTASONE) 5 MG tablet Take 1 tablet (5 mg  total) by mouth daily with breakfast. Patient to double up dose during sick day rule 11/06/21  Yes Shamleffer, Konrad Dolores, MD  Rivaroxaban (XARELTO) 15 MG TABS tablet Take 1 tablet (15 mg total) by mouth daily with supper. 01/04/22  Yes Sheilah Pigeon, PA-C   Current Facility-Administered Medications  Medication Dose Route Frequency Provider Last Rate Last Admin   docusate sodium (COLACE) capsule 100 mg  100 mg Oral BID PRN Gleason, Darcella Gasman, PA-C       insulin aspart (novoLOG) injection 0-9 Units  0-9 Units Subcutaneous Q4H Gleason, Darcella Gasman, PA-C    1 Units at 07/02/22 1610   lactated ringers infusion   Intravenous Continuous Mesner, Barbara Cower, MD 100 mL/hr at 07/02/22 1006 New Bag at 07/02/22 1006   mometasone-formoterol (DULERA) 200-5 MCG/ACT inhaler 2 puff  2 puff Inhalation BID Cheri Fowler, MD   2 puff at 07/02/22 0953   norepinephrine (LEVOPHED) 4mg  in (0.016 mg/mL) premix infusion  0-40 mcg/min Intravenous Continuous Mesner, Barbara Cower, MD 7.5 mL/hr at 07/02/22 1002 2 mcg/min at 07/02/22 1002   Oral care mouth rinse  15 mL Mouth Rinse PRN Cheri Fowler, MD       pantoprazole (PROTONIX) injection 40 mg  40 mg Intravenous Q12H Simonne Martinet, NP   40 mg at 07/02/22 1009   polyethylene glycol (MIRALAX / GLYCOLAX) packet 17 g  17 g Oral Daily PRN Gleason, Darcella Gasman, PA-C       umeclidinium bromide (INCRUSE ELLIPTA) 62.5 MCG/ACT 1 puff  1 puff Inhalation Daily Cheri Fowler, MD   1 puff at 07/02/22 0955   Allergies as of 07/01/2022 - Review Complete 07/01/2022  Allergen Reaction Noted   Other Other (See Comments) 12/26/2021   Propofol Shortness Of Breath and Other (See Comments) 02/06/2007   Flecainide Other (See Comments) 06/19/2020   Amiodarone Other (See Comments) 09/03/2015   Amlodipine Other (See Comments) 09/01/2016   Family History  Problem Relation Age of Onset   Heart disease Mother    Hypertension Mother    Rheum arthritis Mother    Osteoarthritis Mother    Heart disease Father    Hypertension Father    Osteoarthritis Father    Heart disease Sister    Diabetes Brother    Cancer Brother    Social History   Socioeconomic History   Marital status: Legally Separated    Spouse name: Not on file   Number of children: Not on file   Years of education: 16   Highest education level: Bachelor's degree (e.g., BA, AB, BS)  Occupational History   Occupation: Retired    Comment: Community education officer business  Tobacco Use   Smoking status: Never    Passive exposure: Past   Smokeless tobacco: Never  Vaping Use   Vaping Use: Never used   Substance and Sexual Activity   Alcohol use: Never   Drug use: Never   Sexual activity: Not Currently  Other Topics Concern   Not on file  Social History Narrative   Right Handed    Social Determinants of Health   Financial Resource Strain: Not on file  Food Insecurity: No Food Insecurity (12/26/2021)   Hunger Vital Sign    Worried About Running Out of Food in the Last Year: Never true    Ran Out of Food in the Last Year: Never true  Transportation Needs: No Transportation Needs (12/26/2021)   PRAPARE - Administrator, Civil Service (Medical): No    Lack of Transportation (  Non-Medical): No  Physical Activity: Not on file  Stress: Not on file  Social Connections: Not on file  Intimate Partner Violence: Not At Risk (12/26/2021)   Humiliation, Afraid, Rape, and Kick questionnaire    Fear of Current or Ex-Partner: No    Emotionally Abused: No    Physically Abused: No    Sexually Abused: No   Review of Systems:  Review of Systems  Respiratory:  Positive for shortness of breath.   Cardiovascular:  Negative for chest pain.  Gastrointestinal:  Positive for abdominal pain, blood in stool and nausea. Negative for vomiting.    OBJECTIVE:   Temp:  [97.5 F (36.4 C)-97.9 F (36.6 C)] 97.7 F (36.5 C) (06/07 0900) Pulse Rate:  [69-139] 70 (06/07 1045) Resp:  [12-27] 22 (06/07 1045) BP: (66-131)/(51-90) 87/62 (06/07 1045) SpO2:  [91 %-100 %] 99 % (06/07 1045) Weight:  [55.8 kg] 55.8 kg (06/06 2348) Last BM Date : 06/29/22 Physical Exam Constitutional:      General: She is not in acute distress.    Appearance: She is not ill-appearing, toxic-appearing or diaphoretic.  Cardiovascular:     Rate and Rhythm: Normal rate and regular rhythm.  Pulmonary:     Effort: No respiratory distress.     Breath sounds: Normal breath sounds.     Comments: Supplemental oxygen 2L Abdominal:     General: Bowel sounds are normal. There is no distension.     Palpations: Abdomen is  soft.     Tenderness: There is no abdominal tenderness. There is no guarding.  Skin:    General: Skin is warm and dry.  Neurological:     Mental Status: She is alert.     Labs: Recent Labs    07/01/22 2349 07/02/22 0323 07/02/22 0940  WBC 7.6  --  10.2  HGB 10.5* 7.0* 9.6*  HCT 32.6* 21.5* 28.6*  PLT 143*  --  PENDING   BMET Recent Labs    07/01/22 2349  NA 134*  K 4.4  CL 100  CO2 24  GLUCOSE 138*  BUN 27*  CREATININE 1.16*  CALCIUM 9.1   LFT Recent Labs    07/01/22 2349  PROT 7.9  ALBUMIN 3.4*  AST 36  ALT 25  ALKPHOS 73  BILITOT 0.6   PT/INR Recent Labs    07/01/22 2349  LABPROT 34.6*  INR 3.4*    Diagnostic imaging: CT ANGIO GI BLEED  Result Date: 07/02/2022 CLINICAL DATA:  Mesenteric ischemia. Diarrhea and blood in stool. On Xarelto. Abdominal cramping. EXAM: CTA ABDOMEN AND PELVIS WITHOUT AND WITH CONTRAST TECHNIQUE: Multidetector CT imaging of the abdomen and pelvis was performed using the standard protocol during bolus administration of intravenous contrast. Multiplanar reconstructed images and MIPs were obtained and reviewed to evaluate the vascular anatomy. RADIATION DOSE REDUCTION: This exam was performed according to the departmental dose-optimization program which includes automated exposure control, adjustment of the mA and/or kV according to patient size and/or use of iterative reconstruction technique. CONTRAST:  OMNIPAQUE IOHEXOL 350 MG/ML SOLN COMPARISON:  CT 12/13/2020 FINDINGS: VASCULAR Aorta: Mild aortic atherosclerotic calcification. No aneurysm or dissection. Celiac: Calcified plaque at the origin causes moderate narrowing. No aneurysm or dissection. SMA: Patent without aneurysm or dissection. Renals: Single right and 2 left renal arteries. Patent without aneurysm or dissection. IMA: Patent. Inflow: Patent without aneurysm or dissection. Proximal Outflow: Patent without aneurysm or dissection. Veins: Patent portal vein and IVC. Review  of the MIP images confirms the above findings. NON-VASCULAR Lower  chest: Cardiomegaly. Cardiac valve prostheses. Interstitial lung disease in the lung bases. Hepatobiliary: Cholelithiasis without evidence of cholecystitis. Unremarkable liver and biliary tree. Pancreas: Unremarkable. Spleen: Unremarkable. Adrenals/Urinary Tract: Stable adrenal glands. No urinary calculi or hydronephrosis. Unremarkable bladder. Stomach/Bowel: Intraluminal hyperdensity on arterial phase images which diffuses on portal venous phase near the descending colon/sigmoid colon junction (series 6/image 93 and 12/55) compatible with active GI bleed. Mild distention of the rectum and sigmoid colon downstream from the area of active bleeding with heterogenous intraluminal fluid compatible with hemorrhage and stool. Sigmoid colon and descending colon diverticulosis without diverticulitis. The jejunum in the left upper quadrant is distended with fluid and demonstrates wall thickening and hyperenhancement. There is smooth tapering distally without abrupt transition point to suggest obstruction. Findings are suggestive of enteritis. Lymphatic: No lymphadenopathy. Reproductive: Calcified fibroids in the uterus.  No adnexal mass. Other: No free intraperitoneal fluid or air. Musculoskeletal: No acute fracture. Grade 2 anterolisthesis of L4 on L5 with advanced disc space height loss and degenerative endplate changes at L4-L5. Chronic compression deformity of L3 is unchanged. Intermediate density irregular fluid collection in the right posterolateral gluteal soft tissues (circa series 6/image 128) measuring 3.1 x 1.9 cm is suspicious for hematoma. No evidence of active bleeding. IMPRESSION: 1. Active GI bleed at the descending colon/sigmoid colon junction. This likely represents active diverticular hemorrhage. 2. Enteritis involving the jejunum in the left upper quadrant. 3. Small hematoma in the right gluteal fat without evidence of active bleeding.  Aortic Atherosclerosis (ICD10-I70.0). Critical Value/emergent results were called by telephone at the time of interpretation on 07/02/2022 at 2:30 am to provider [facility] , who verbally acknowledged these results. Electronically Signed   By: Minerva Fester M.D.   On: 07/02/2022 02:33   XR Ankle Complete Left  Result Date: 07/01/2022 AP lateral mortise radiographs left ankle reviewed.  Lateral malleolar fracture unchanged with slight displacement but minimal shortening.  Mortise is symmetric.  Some callus formation is present but it is minimal.   IMPRESSION: Hematochezia CT angiogram GI bleed positive for extravasation in descending colon, IR following Supratherapeutic INR  -S/p Kcentra and vitamin K, pending repeat Acute blood loss anemia Hypovolemic shock secondary to above Atrial fibrillation, on Xarelto prior to admission History achalasia Idiopathic pulmonary fibrosis Systemic lupus erythematosus Heart block status post AICD placement   PLAN: -Suspect diverticular source of bleeding, given hypotension there may be some ischemia in play as well (she has some mild abdominal pain) -Continue supportive measures, vasopressor support, blood transfusion, IV fluids -Await INR trend -Had further blood per rectum on my exam today -If further blood per rectum occurs with correction of INR, recommend re-evaluation by IR possible repeat CT angiogram -Currently un-prepped and not suitable for colonoscopy -[facility] will follow, Dr. Bosie Clos over weekend   LOS: 0 days   Liliane Shi, DO [facility] Gastroenterology

## 2022-07-02 NOTE — Progress Notes (Signed)
Off pressors.  CBGs noted Changed MIVFs to D5LR at 75 ml hr    Lindsey Huber ACNP-BC [facility] Pulmonary/Critical Care Pager # [phone] OR # [phone] if no answer

## 2022-07-02 NOTE — H&P (Signed)
NAME:  Lindsey Huber, MRN:  [id number], DOB:  1945-11-30, LOS: 0 ADMISSION DATE:  07/01/2022, CONSULTATION DATE:  07/02/22 REFERRING MD:  EDP, CHIEF COMPLAINT:  GIB   History of Present Illness:  Lindsey Huber is a 77 y.o. 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  She was initially hemodynamically stable with a Hgb of 10.5.  She had another BM of bright blood with clots; and CTA showed active GIB at the descending colon/sigmoid colon junction and enteritis of the jejunum.   Her Hgb dropped to 7.0 with hypotension, and three units PRBC's and Levophed, Kcentra and vitamin K were ordered and IR was consulted.  PCCM asked to admit.     Pertinent  Medical History   has a past medical history of Achalasia, Acquired hypothyroidism (08/20/2020), Acute metabolic encephalopathy (07/25/2021), AICD (automatic cardioverter/defibrillator) present (11/14/2019), Allergic rhinitis, Ankle fracture (03/25/2022), Aortic valve disorder (03/26/2002), Atherosclerosis of abdominal aorta (HCC) (05/01/2020), Cardiomyopathy (HCC), CHB (complete heart block) (HCC) (08/18/2017), CHF (congestive heart failure) (HCC), Cholelithiasis without obstruction (05/01/2020), Chronic gouty arthritis, Chronic heart failure with preserved ejection fraction (HCC) (05/01/2020), Chronic kidney disease (CKD), active medical management without dialysis, stage 3 (moderate) (HCC) (05/01/2020), Closed torus fracture of distal end of right radius with delayed healing (08/12/2021), Delusional thoughts (HCC), Diverticulosis of colon (05/01/2020), Epistaxis, Essential (primary) hypertension (08/18/2017), Gastroesophageal reflux disease (05/01/2020), Gastrointestinal  hemorrhage, 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 (11/14/2019), Hypercoagulability due to atrial fibrillation (HCC) (05/01/2020), Hyperlipidemia (11/14/2019), Hypocalcemia (12/14/2020), Hypoglycemia, Hypokalemia (12/14/2020), Hypomagnesemia (12/14/2020), Hypotension (12/14/2020), Idiopathic pulmonary fibrosis (HCC) (05/01/2020), 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05/01/2020), Iron deficiency anemia, Long term (current) use of anticoagulants, Malnutrition of mild degree (Gomez: 75% to less than 90% of standard weight) (HCC), Mild dementia (HCC) (08/12/2021), Mitral and aortic incompetence (11/14/2019), Non-rheumatic atrial fibrillation (HCC) (05/01/2020), Non-toxic multinodular goiter (08/20/2020), NSVT (nonsustained ventricular tachycardia) (HCC) (08/18/2017), Oropharyngeal dysphagia, Osteoarthritis of hip (05/01/2020), Osteoarthritis of knee (05/01/2020), Osteopenia of neck of left femur, Pain of left hip joint (12/12/2017), Proteinuria (05/01/2020), Raynaud's disease, Recurrent falls (04/09/2021), Rheumatoid arthritis (HCC), 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   Significant Hospital Events: Including procedures, antibiotic start and stop dates in addition to other pertinent events   6/7 admit with LGIB and hypotension, reversed with Kcentra and requiring peripheral levophed  Interim History / Subjective:  As above  Objective   Blood pressure (!) 104/58, pulse 71, temperature (!) 97.5 F (36.4 C), temperature source Oral, resp. rate 14, height 5\' 3"  (1.6 m), weight 55.8 kg, SpO2 100 %.        Intake/Output Summary (Last 24 hours) at 07/02/2022 0459 Last data filed at 07/02/2022 0242 Gross per 24 hour   Intake 500 ml  Output --  Net 500 ml   Filed Weights   07/01/22 2348  Weight: 55.8 kg    General:  thin elderly F, resting in bed in no acute distress HEENT: MM pale/moist, sclera anicteric Neuro: alert and oriented, fatigued but arousable CV: s1s2 rrr, no m/r/g PULM:  clear bilaterally on RA GI: soft, non-distended and non-tender Extremities: warm/dry, no edema, R ankle brace  Skin: no rashes or lesions  Resolved Hospital Problem list     Assessment & Plan:    Acute LGIB on Warfarin ABLA Hemorrhagic Shock POA Received Kcentra and vitamin K -onset the day of admission, receiving three units prbc's  and IVF -continue levophed to maintain MAP >65 -Calcium 2g -IR following, initial plan for supportive care -serial Hgb -NPO  Atrial Fibrillation HFpEF Valvular disease SSS s/p ppm -monitor, hold Lasix, bb, xarelto -monitor for signs of volume overload   COPD/ILD Follows with [name], no home O2 -continue Breztri and albuterol -does not use CPAP  CKD stage 3a -at risk for worsening renal failure in the setting of shock -ensure adequate renal perfusion, follow UOP and renal indices  Lupus -hold home prednisone and plaquenil while npo  Best Practice (right click and "Reselect all SmartList Selections" daily)   Diet/type: NPO DVT prophylaxis: SCD GI prophylaxis: N/A Lines: N/A Foley:  N/A Code Status:  full code Last date of multidisciplinary goals of care discussion [discussed plan with patient, daughter and granddaughter.  They confirm full code]  Labs   CBC: Recent Labs  Lab 07/01/22 2349 07/02/22 0323  WBC 7.6  --   NEUTROABS 5.8  --   HGB 10.5* 7.0*  HCT 32.6* 21.5*  MCV 100.6*  --   PLT 143*  --     Basic Metabolic Panel: Recent Labs  Lab 07/01/22 2349  NA 134*  K 4.4  CL 100  CO2 24  GLUCOSE 138*  BUN 27*  CREATININE 1.16*  CALCIUM 9.1   GFR: Estimated Creatinine Clearance: 34.7 mL/min (A) (by C-G formula based on SCr  of 1.16 mg/dL (H)). Recent Labs  Lab 07/01/22 2349  WBC 7.6    Liver Function Tests: Recent Labs  Lab 07/01/22 2349  AST 36  ALT 25  ALKPHOS 73  BILITOT 0.6  PROT 7.9  ALBUMIN 3.4*   No results for input(s): "LIPASE", "AMYLASE" in the last 168 hours. No results for input(s): "AMMONIA" in the last 168 hours.  ABG    Component Value Date/Time   HCO3 26.2 11/27/2020 1258   TCO2 19 (L) 12/13/2020 1928   O2SAT 82.0 11/27/2020 1258     Coagulation Profile: Recent Labs  Lab 07/01/22 2349  INR 3.4*    Cardiac Enzymes: No results for input(s): "CKTOTAL", "CKMB", "CKMBINDEX", "TROPONINI" in the last 168 hours.  HbA1C: Hgb A1c MFr Bld  Date/Time Value Ref Range Status  07/03/2020 01:50 PM 5.4 4.6 - 6.5 % Final    Comment:    Glycemic Control Guidelines for People with Diabetes:Non Diabetic:  <6%Goal of Therapy: <7%Additional Action Suggested:  >8%     CBG: No results for input(s): "GLUCAP" in the last 168 hours.  Review of Systems:   Please see the history of present illness. All other systems reviewed and are negative    Past Medical History:  She,  has a past medical history of Achalasia, Acquired hypothyroidism (08/20/2020), Acute metabolic encephalopathy (07/25/2021), AICD (automatic cardioverter/defibrillator) present (11/14/2019), Allergic rhinitis, Ankle fracture (03/25/2022), Aortic valve disorder (03/26/2002), Atherosclerosis of abdominal aorta (HCC) (05/01/2020), Cardiomyopathy (HCC), CHB (complete heart block) (HCC) (08/18/2017), CHF (congestive heart failure) (HCC), Cholelithiasis without obstruction (05/01/2020), Chronic gouty arthritis, Chronic heart failure with preserved ejection fraction (HCC) (05/01/2020), Chronic kidney disease (CKD), active medical management without dialysis, stage 3 (moderate) (HCC) (05/01/2020), Closed torus fracture of distal end of right radius with delayed healing (08/12/2021), Delusional thoughts (HCC), Diverticulosis of colon  (05/01/2020), Epistaxis, Essential (primary) hypertension (  08/18/2017), Gastroesophageal reflux disease (05/01/2020), Gastrointestinal hemorrhage, 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 (11/14/2019), Hypercoagulability due to atrial fibrillation (HCC) (05/01/2020), Hyperlipidemia (11/14/2019), Hypocalcemia (12/14/2020), Hypoglycemia, Hypokalemia (12/14/2020), Hypomagnesemia (12/14/2020), Hypotension (12/14/2020), Idiopathic pulmonary fibrosis (HCC) (05/01/2020), 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05/01/2020), Iron deficiency anemia, Long term (current) use of anticoagulants, Malnutrition of mild degree (Gomez: 75% to less than 90% of standard weight) (HCC), Mild dementia (HCC) (08/12/2021), Mitral and aortic incompetence (11/14/2019), Non-rheumatic atrial fibrillation (HCC) (05/01/2020), Non-toxic multinodular goiter (08/20/2020), NSVT (nonsustained ventricular tachycardia) (HCC) (08/18/2017), Oropharyngeal dysphagia, Osteoarthritis of hip (05/01/2020), Osteoarthritis of knee (05/01/2020), Osteopenia of neck of left femur, Pain of left hip joint (12/12/2017), Proteinuria (05/01/2020), Raynaud's disease, Recurrent falls (04/09/2021), Rheumatoid arthritis (HCC), 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   Surgical History:   Past Surgical History:  Procedure Laterality Date   BREAST BIOPSY Left    CARDIAC VALVE SURGERY     CARPAL TUNNEL RELEASE     COLONOSCOPY WITH PROPOFOL N/A 12/20/2020   Procedure: COLONOSCOPY WITH PROPOFOL;  Surgeon: Willis Modena, MD;  Location: [facility]COPY;  Service: Endoscopy;  Laterality: N/A;   HEMORRHOID SURGERY     PACEMAKER INSERTION     RIGHT/LEFT HEART CATH AND CORONARY ANGIOGRAPHY  N/A 11/27/2020   Procedure: RIGHT/LEFT HEART CATH AND CORONARY ANGIOGRAPHY;  Surgeon: Dolores Patty, MD;  Location: [facility] INVASIVE CV LAB;  Service: Cardiovascular;  Laterality: N/A;   TONSILLECTOMY       Social History:   reports that she has never smoked. She has been exposed to tobacco smoke. She has never used smokeless tobacco. She reports that she does not drink alcohol and does not use drugs.   Family History:  Her family history includes Cancer in her brother; Diabetes in her brother; Heart disease in her father, mother, and sister; Hypertension in her father and mother; Osteoarthritis in her father and mother; Rheum arthritis in her mother.   Allergies Allergies  Allergen Reactions   Other Other (See Comments)    Patient is to NOT EAT any foods with husks or tree nuts, popcorn   Propofol Shortness Of Breath and Other (See Comments)    "Caused asthma"   Flecainide Other (See Comments)    Reaction not recalled   Amiodarone Other (See Comments)    Delirium/Confusion/Psychosis   Amlodipine Other (See Comments)    "Tired and syncope"     Home Medications  Prior to Admission medications   Medication Sig Start Date End Date Taking? Authorizing Provider  acetaminophen (TYLENOL) 500 MG tablet Take 1,000 mg by mouth every 6 (six) hours as needed (pain).    [provider]  albuterol (VENTOLIN HFA) 108 (90 Base) MCG/ACT inhaler Inhale 2 puffs into the lungs every 6 (six) hours as needed for wheezing or shortness of breath.    [provider]  allopurinol (ZYLOPRIM) 100 MG tablet TAKE 1 TABLET BY MOUTH EVERY DAY 06/01/22   Rice, Jamesetta Orleans, MD  amoxicillin (AMOXIL) 500 MG capsule 4 capsules by mouth 1 hour before dental procedure. 12/30/21   [provider]  Budeson-Glycopyrrol-Formoterol (BREZTRI AEROSPHERE) 160-9-4.8 MCG/ACT AERO Inhale 2 puffs into the lungs 2 (two) times daily as needed ("for flares").    [provider]  Cholecalciferol  (VITAMIN D3) 25 MCG (1000 UT) CAPS Take 1,000 Units by mouth daily with lunch.    [provider]  famotidine (PEPCID) 20 MG tablet Take 20 mg by mouth daily as needed for heartburn or indigestion. 07/03/21   [provider]  ferrous sulfate 325 (65 FE) MG tablet Take 325 mg by mouth See admin instructions. Take 325 mg by mouth with breakfast and supper    [provider]  fluticasone (FLONASE) 50 MCG/ACT nasal spray PLACE 1 SPRAY INTO BOTH NOSTRILS 2 (TWO) TIMES DAILY 05/11/22   Hunsucker, Lesia Sago, MD  furosemide (LASIX) 40 MG tablet TAKE TABLET BY MOUTH IN THE MORNING (SCHEDULED) AND AN ADDITIONAL TABLET AT BEDTIME IF SWELLING IS PRESENT. HOLD IF SYSTOLIC READING IS <110 OR DIASTOLIC READING IS <60. 02/25/22   Sheilah Pigeon, PA-C  Glycerin-Hypromellose-PEG 400 (DRY EYE RELIEF DROPS OP) Place 1 drop into both eyes in the morning.    [provider]  HYDROcodone-acetaminophen (NORCO/VICODIN) 5-325 MG tablet Take 1 tablet by mouth every 12 (twelve) hours as needed for moderate pain. 06/11/22   Magnant, Charles L, PA-C  hydroxychloroquine (PLAQUENIL) 200 MG tablet TAKE 1 TABLET BY MOUTH EVERY DAY 03/29/22   Rice, Jamesetta Orleans, MD  levothyroxine (SYNTHROID) 25 MCG tablet Take 1 tablet (25 mcg total) by mouth daily. 11/06/21   Shamleffer, Konrad Dolores, MD  magnesium oxide (MAG-OX) 400 MG tablet Take 1 tablet (400 mg total) by mouth daily. 11/27/21   Sheilah Pigeon, PA-C  metoprolol succinate (TOPROL-XL) 25 MG 24 hr tablet Take 1.5 tablets (37.5 mg total) by mouth daily. Hold dose if <110 SBP or <60 DBP 06/25/22   Brynda Peon L, NP  Nintedanib (OFEV) 150 MG CAPS Take 150 mg by mouth at bedtime.    [provider]  OLANZapine (ZYPREXA) 2.5 MG tablet TAKE 1 TABLET BY MOUTH EVERYDAY AT BEDTIME 06/01/22   Everlena Cooper, Adam R, DO  potassium chloride (KLOR-CON M10) 10 MEQ tablet TAKE 2 TABLETS BY MOUTH DAILY 06/01/22   Sheilah Pigeon, PA-C  predniSONE (DELTASONE) 5 MG  tablet Take 1 tablet (5 mg total) by mouth daily with breakfast. Patient to double up dose during sick day rule 11/06/21   Shamleffer, Konrad Dolores, MD  Rivaroxaban (XARELTO) 15 MG TABS tablet Take 1 tablet (15 mg total) by mouth daily with supper. 01/04/22   Sheilah Pigeon, PA-C     Critical care time: 45 minutes       CRITICAL CARE Performed by: Darcella Gasman Charlee Whitebread   Total critical care time: 45 minutes  Critical care time was exclusive of separately billable procedures and treating other patients.  Critical care was necessary to treat or prevent imminent or life-threatening deterioration.  Critical care was time spent personally by me on the following activities: development of treatment plan with patient and/or surrogate as well as nursing, discussions with consultants, evaluation of patient's response to treatment, examination of patient, obtaining history from patient or surrogate, ordering and performing treatments and interventions, ordering and review of laboratory studies, ordering and review of radiographic studies, pulse oximetry and re-evaluation of patient's condition.   Darcella Gasman Lilee Aldea, PA-C [facility] See Amion for pager If no response to pager , please call [phone] until 7pm After 7:00 pm call Elink  [phone]

## 2022-07-02 NOTE — Telephone Encounter (Signed)
Pls make sure appt scheduled per Dr Dean 

## 2022-07-02 NOTE — ED Notes (Signed)
Another large bright red rectal bleed with clots. Pt very pale, SBP labile 80-100. HR stable at 70. MD informed.

## 2022-07-02 NOTE — ED Notes (Signed)
Patient in and out cathed for UA collection.

## 2022-07-02 NOTE — ED Provider Notes (Signed)
[place] EMERGENCY DEPARTMENT AT [facility] Provider Note   CSN: [id number] Arrival date & time: 07/01/22  2342     History  Chief Complaint  Patient presents with   GI Bleeding    Lindsey Huber is a 77 y.o. female.  77 year old female presents ER today with rectal bleeding.  Patient has a history of atrial fibrillation, aortic valve replacement, tricuspid valve replacement and mitral valve as well on Xarelto.  Also has some abandoned defibrillator leads apparently.  States has been have some crampy abdominal discomfort about the day today with diarrhea.  But then tonight she started having bright red blood and maroon in her stools.  She presents here for further evaluation.  She does not feel lightheaded or nauseous.  No fevers.  No history of GI bleeding.  She states that she is due for her colonoscopy but she is not sure who her GI doctor was.   On review of the notes appears that she had a colonoscopy in 2022 with Dr. Dulce Sellar during a hospital admission for GI bleeding.  She was found to have diverticulitis at that time.  Do not see where she followed up after that.  Other GI visits over the last few years have been in [place].     Home Medications Prior to Admission medications   Medication Sig Start Date End Date Taking? Authorizing Provider  acetaminophen (TYLENOL) 500 MG tablet Take 1,000 mg by mouth every 6 (six) hours as needed (pain).    [provider]  albuterol (VENTOLIN HFA) 108 (90 Base) MCG/ACT inhaler Inhale 2 puffs into the lungs every 6 (six) hours as needed for wheezing or shortness of breath.    [provider]  allopurinol (ZYLOPRIM) 100 MG tablet TAKE 1 TABLET BY MOUTH EVERY DAY 06/01/22   Rice, Jamesetta Orleans, MD  amoxicillin (AMOXIL) 500 MG capsule 4 capsules by mouth 1 hour before dental procedure. 12/30/21   [provider]  Budeson-Glycopyrrol-Formoterol (BREZTRI AEROSPHERE) 160-9-4.8 MCG/ACT AERO Inhale 2 puffs into  the lungs 2 (two) times daily as needed ("for flares").    [provider]  Cholecalciferol (VITAMIN D3) 25 MCG (1000 UT) CAPS Take 1,000 Units by mouth daily with lunch.    [provider]  famotidine (PEPCID) 20 MG tablet Take 20 mg by mouth daily as needed for heartburn or indigestion. 07/03/21   [provider]  ferrous sulfate 325 (65 FE) MG tablet Take 325 mg by mouth See admin instructions. Take 325 mg by mouth with breakfast and supper    [provider]  fluticasone (FLONASE) 50 MCG/ACT nasal spray PLACE 1 SPRAY INTO BOTH NOSTRILS 2 (TWO) TIMES DAILY 05/11/22   Hunsucker, Lesia Sago, MD  furosemide (LASIX) 40 MG tablet TAKE TABLET BY MOUTH IN THE MORNING (SCHEDULED) AND AN ADDITIONAL TABLET AT BEDTIME IF SWELLING IS PRESENT. HOLD IF SYSTOLIC READING IS <110 OR DIASTOLIC READING IS <60. 02/25/22   Sheilah Pigeon, PA-C  Glycerin-Hypromellose-PEG 400 (DRY EYE RELIEF DROPS OP) Place 1 drop into both eyes in the morning.    [provider]  HYDROcodone-acetaminophen (NORCO/VICODIN) 5-325 MG tablet Take 1 tablet by mouth every 12 (twelve) hours as needed for moderate pain. 06/11/22   Magnant, Charles L, PA-C  hydroxychloroquine (PLAQUENIL) 200 MG tablet TAKE 1 TABLET BY MOUTH EVERY DAY 03/29/22   Rice, Jamesetta Orleans, MD  levothyroxine (SYNTHROID) 25 MCG tablet Take 1 tablet (25 mcg total) by mouth daily. 11/06/21   Shamleffer, Konrad Dolores, MD  magnesium oxide (MAG-OX) 400 MG tablet Take 1 tablet (400 mg total) by mouth daily. 11/27/21   Sheilah Pigeon, PA-C  metoprolol succinate (TOPROL-XL) 25 MG 24 hr tablet Take 1.5 tablets (37.5 mg total) by mouth daily. Hold dose if <110 SBP or <60 DBP 06/25/22   Brynda Peon L, NP  Nintedanib (OFEV) 150 MG CAPS Take 150 mg by mouth at bedtime.    [provider]  OLANZapine (ZYPREXA) 2.5 MG tablet TAKE 1 TABLET BY MOUTH EVERYDAY AT BEDTIME 06/01/22   Everlena Cooper, Adam R, DO  potassium chloride (KLOR-CON M10) 10 MEQ  tablet TAKE 2 TABLETS BY MOUTH DAILY 06/01/22   Sheilah Pigeon, PA-C  predniSONE (DELTASONE) 5 MG tablet Take 1 tablet (5 mg total) by mouth daily with breakfast. Patient to double up dose during sick day rule 11/06/21   Shamleffer, Konrad Dolores, MD  Rivaroxaban (XARELTO) 15 MG TABS tablet Take 1 tablet (15 mg total) by mouth daily with supper. 01/04/22   Sheilah Pigeon, PA-C      Allergies    Other, Propofol, Flecainide, Amiodarone, and Amlodipine    Review of Systems   Review of Systems  Physical Exam Updated Vital Signs BP 131/74 (BP Location: Left Arm)   Pulse 72   Temp 97.8 F (36.6 C) (Oral)   Resp 16   Ht 5\' 3"  (1.6 m)   Wt 55.8 kg   SpO2 100%   BMI 21.79 kg/m  Physical Exam Vitals and nursing note reviewed.  Constitutional:      Appearance: She is well-developed.  HENT:     Head: Normocephalic and atraumatic.  Eyes:     Pupils: Pupils are equal, round, and reactive to light.  Cardiovascular:     Rate and Rhythm: Normal rate and regular rhythm.  Pulmonary:     Effort: No respiratory distress.     Breath sounds: No stridor.  Abdominal:     General: There is no distension.  Genitourinary:    Comments: With nurses present: BRPR w/ component of melena as well Musculoskeletal:     Cervical back: Normal range of motion.  Skin:    General: Skin is warm and dry.  Neurological:     Mental Status: She is alert.     ED Results / Procedures / Treatments   Labs (all labs ordered are listed, but only abnormal results are displayed) Labs Reviewed  CBC WITH DIFFERENTIAL/PLATELET  COMPREHENSIVE METABOLIC PANEL  PROTIME-INR  TYPE AND SCREEN    EKG None  Radiology XR Ankle Complete Left  Result Date: 07/01/2022 AP lateral mortise radiographs left ankle reviewed.  Lateral malleolar fracture unchanged with slight displacement but minimal shortening.  Mortise is symmetric.  Some callus formation is present but it is minimal.   Procedures .Critical  Care  Performed by: Marily Memos, MD Authorized by: Marily Memos, MD   Critical care provider statement:    Critical care time (minutes):  30   Critical care was necessary to treat or prevent imminent or life-threatening deterioration of the following conditions:  Shock   Critical care was time spent personally by me on the following activities:  Development of treatment plan with patient or surrogate, discussions with consultants, evaluation of patient's response to treatment, examination of patient, ordering and review of laboratory studies, ordering and review of radiographic studies, ordering and performing treatments and interventions, pulse oximetry, re-evaluation of patient's condition and review of old charts     Medications Ordered in ED Medications  lactated  ringers infusion (has no administration in time range)    ED Course/ Medical Decision Making/ A&P                             Medical Decision Making Amount and/or Complexity of Data Reviewed Labs: ordered. Radiology: ordered.  Risk Prescription drug management. Decision regarding hospitalization.   Patient on Xarelto for A-fib and multiple valves presents ER today with GI bleeding.  Not symptomatic at this time but with his Xarelto that cannot be stopped and history of diverticulitis we will get an angio CV and locate the bleed for possible intervention versus the need for repeat colonoscopy.  Angio GI with active bleed around the splenic. Discussed with IR, Dr. Milford Cage, suggests reversing xarelto (even though it is for valves) and if she becomes unstable or continues bleeding afterwards then consider embolization.   D/w Pharmacy. Andexa ordered. Will consult hospitalist for admission and GI for consult.   D/w Dr. Julian Reil. Shortly after her blood pressure started dropping to 70's systolic. Repeat Hb at that time ended up at 7. Two more units ordered. Dr. Julian Reil requested ICU admit and speaking with IR again.    Dr. Scot Dock suggests Vitamin K. If this doesn't help he would take her for embolization. If it helps, would consider it at 0700.   Pressors started. Discussed with PCCM for admission.   Dr. Judie Petit called to check on patient. Will hold on IR for now. Dr. Merrily Pew aware.   GI team on board, Dr. Merrily Pew aware.   Final Clinical Impression(s) / ED Diagnoses Final diagnoses:  Acute GI bleeding  Symptomatic anemia    Rx / DC Orders ED Discharge Orders     None         Marypat Kimmet, Barbara Cower, MD 07/02/22 [phone]

## 2022-07-02 NOTE — Progress Notes (Signed)
NAME:  Lindsey Huber, MRN:  [id number], DOB:  03/13/1945, LOS: 0 ADMISSION DATE:  07/01/2022, CONSULTATION DATE:  07/02/22 REFERRING MD:  EDP, CHIEF COMPLAINT:  GIB   History of Present Illness:  Lindsey Huber is a 77 y.o. 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  She was initially hemodynamically stable with a Hgb of 10.5.  She had another BM of bright blood with clots; and CTA showed active GIB at the descending colon/sigmoid colon junction and enteritis of the jejunum.   Her Hgb dropped to 7.0 with hypotension, and three units PRBC's and Levophed, Kcentra and vitamin K were ordered and IR was consulted.  PCCM asked to admit.     Pertinent  Medical History   has a past medical history of Achalasia, Acquired hypothyroidism (08/20/2020), Acute metabolic encephalopathy (07/25/2021), AICD (automatic cardioverter/defibrillator) present (11/14/2019), Allergic rhinitis, Ankle fracture (03/25/2022), Aortic valve disorder (03/26/2002), Atherosclerosis of abdominal aorta (HCC) (05/01/2020), Cardiomyopathy (HCC), CHB (complete heart block) (HCC) (08/18/2017), CHF (congestive heart failure) (HCC), Cholelithiasis without obstruction (05/01/2020), Chronic gouty arthritis, Chronic heart failure with preserved ejection fraction (HCC) (05/01/2020), Chronic kidney disease (CKD), active medical management without dialysis, stage 3 (moderate) (HCC) (05/01/2020), Closed torus fracture of distal end of right radius with delayed healing (08/12/2021), Delusional thoughts (HCC), Diverticulosis of colon (05/01/2020), Epistaxis, Essential (primary) hypertension (08/18/2017), Gastroesophageal reflux disease (05/01/2020), Gastrointestinal  hemorrhage, 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 (11/14/2019), Hypercoagulability due to atrial fibrillation (HCC) (05/01/2020), Hyperlipidemia (11/14/2019), Hypocalcemia (12/14/2020), Hypoglycemia, Hypokalemia (12/14/2020), Hypomagnesemia (12/14/2020), Hypotension (12/14/2020), Idiopathic pulmonary fibrosis (HCC) (05/01/2020), 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05/01/2020), Iron deficiency anemia, Long term (current) use of anticoagulants, Malnutrition of mild degree (Gomez: 75% to less than 90% of standard weight) (HCC), Mild dementia (HCC) (08/12/2021), Mitral and aortic incompetence (11/14/2019), Non-rheumatic atrial fibrillation (HCC) (05/01/2020), Non-toxic multinodular goiter (08/20/2020), NSVT (nonsustained ventricular tachycardia) (HCC) (08/18/2017), Oropharyngeal dysphagia, Osteoarthritis of hip (05/01/2020), Osteoarthritis of knee (05/01/2020), Osteopenia of neck of left femur, Pain of left hip joint (12/12/2017), Proteinuria (05/01/2020), Raynaud's disease, Recurrent falls (04/09/2021), Rheumatoid arthritis (HCC), 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   Significant Hospital Events: Including procedures, antibiotic start and stop dates in addition to other pertinent events   6/7 admit with LGIB and hypotension, reversed with Kcentra and requiring peripheral levophed. IR consulted. No intervention other than reversal of AC recommended  Interim History / Subjective:  Feels about the same. Transfusion completed   Objective   Blood pressure 118/67, pulse 72, temperature 97.6 F (36.4 C), temperature source Oral, resp. rate (Abnormal) 26, height 5\' 3"  (1.6 m), weight 55.8 kg, SpO2 100 %.         Intake/Output Summary (Last 24 hours) at 07/02/2022 0720 Last data filed at 07/02/2022 0242 Gross per 24 hour  Intake 500 ml  Output no documentation  Net 500 ml   Filed Weights   07/01/22 2348  Weight: 55.8 kg   General: Resting in bed currently no acute distress HEENT normocephalic atraumatic no jugular venous distention Pulmonary: Clear diminished bases no accessory use currently room air Cardiac: Regular rate and rhythm with holosystolic murmur  Abdomen: Soft nontender no organomegaly, did have a large bloody stool Extremities: Warm dry Neuro intact.   Resolved Hospital Problem list     Assessment & Plan:    Acute LGIB on Xarelto (POA) ABLA Hemorrhagic Shock Received Kcentra and vitamin K Weaning NE  Plan F/u post transfusion CBC and INR  Titrate NE for MAP > 65 Serial CBC NPO Scheduled PPI Gi consultation pending  Atrial Fibrillation HFpEF Valvular disease w/prior bioprosthetic AVR/MV ring/bioprosthetic TVR 09/25/2010  SSS s/p ppm Plan hold Lasix, bb, xarelto monitor for signs of volume overload Tele  COPD/ILD Follows with [name], no home O2. No current evidence of exacerbation Plan continue Breztri and albuterol  CKD stage 3a -at risk for worsening renal failure in the setting of shock Plan F/u chem Renal dose meds   Lupus plan hold home prednisone and plaquenil while npo  Best Practice (right click and "Reselect all SmartList Selections" daily)   Diet/type: NPO DVT prophylaxis: SCD GI prophylaxis: N/A Lines: N/A Foley:  N/A Code Status:  full code Last date of multidisciplinary goals of care discussion [discussed plan with patient, daughter and granddaughter.  They confirm full code]    Critical care time: 32 min   Simonne Martinet ACNP-BC [facility] Pulmonary/Critical Care Pager # [phone] OR # [phone] if no answer

## 2022-07-02 NOTE — ED Notes (Signed)
ED TO INPATIENT HANDOFF REPORT  ED Nurse Name and Phone #: Victorino Dike [phone]  S Name/Age/Gender Lindsey Huber 77 y.o. female Room/Bed: 021C/021C  Code Status   Code Status: Full Code  Home/SNF/Other Home Patient oriented to: self, place, time, and situation Is this baseline? Yes   Triage Complete: Triage complete  Chief Complaint GIB (gastrointestinal bleeding) [K92.2]  Triage Note Pt via EMS for new onset diarrhea and blood in stool tonight. States had bright red bleeding in stool. Pt currently on Xarelto. Also having intermittent abdominal cramping with episodes of diarrhea.    Allergies Allergies  Allergen Reactions   Other Other (See Comments)    Patient is to NOT EAT any foods with husks or tree nuts, popcorn   Propofol Shortness Of Breath and Other (See Comments)    "Caused asthma"   Flecainide Other (See Comments)    Reaction not recalled   Amiodarone Other (See Comments)    Delirium/Confusion/Psychosis   Amlodipine Other (See Comments)    "Tired and syncope"    Level of Care/Admitting Diagnosis ED Disposition     ED Disposition  Admit   Condition  --   Comment  Hospital Area: [facility] [[id number]]  Level of Care: ICU [6]  May admit patient to [facility] or [facility] if equivalent level of care is available:: No  Covid Evaluation: Asymptomatic - no recent exposure (last 10 days) testing not required  Diagnosis: GIB (gastrointestinal bleeding) [454098]  Admitting Physician: Cheri Fowler [[id number]]  Attending Physician: Cheri Fowler [[id number]]  Certification:: I certify this patient will need inpatient services for at least 2 midnights  Estimated Length of Stay: 3          B Medical/Surgery History Past Medical History:  Diagnosis Date   Achalasia    Acquired hypothyroidism 08/20/2020   Acute metabolic encephalopathy 07/25/2021   AICD (automatic cardioverter/defibrillator) present 11/14/2019   Allergic rhinitis     Ankle fracture 03/25/2022   Aortic valve disorder 03/26/2002   Atherosclerosis of abdominal aorta (HCC) 05/01/2020   Cardiomyopathy (HCC)    CHB (complete heart block) (HCC) 08/18/2017   CHF (congestive heart failure) (HCC)    Cholelithiasis without obstruction 05/01/2020   Chronic gouty arthritis    Chronic heart failure with preserved ejection fraction (HCC) 05/01/2020   Chronic kidney disease (CKD), active medical management without dialysis, stage 3 (moderate) (HCC) 05/01/2020   Closed torus fracture of distal end of right radius with delayed healing 08/12/2021   Delusional thoughts (HCC)    Diverticulosis of colon 05/01/2020   Epistaxis    Essential (primary) hypertension 08/18/2017   Gastroesophageal reflux disease 05/01/2020   Gastrointestinal hemorrhage    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 11/14/2019   Hypercoagulability due to atrial fibrillation (HCC) 05/01/2020   Hyperlipidemia 11/14/2019   Hypocalcemia 12/14/2020   Hypoglycemia    Hypokalemia 12/14/2020   Hypomagnesemia 12/14/2020   Hypotension 12/14/2020   Idiopathic pulmonary fibrosis (HCC) 05/01/2020   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   Iron deficiency anemia    Long term (current) use of anticoagulants    Malnutrition of mild degree [name]: 75% to less than 90% of standard weight) (HCC)    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/12/2021   Mitral and aortic incompetence 11/14/2019   Non-rheumatic atrial fibrillation (HCC) 05/01/2020   Non-toxic multinodular goiter 08/20/2020   NSVT (nonsustained ventricular tachycardia) (HCC) 08/18/2017   Oropharyngeal dysphagia    Osteoarthritis of hip 05/01/2020   Osteoarthritis of knee 05/01/2020  Osteopenia of neck of left femur    Pain of left hip joint 12/12/2017   Proteinuria 05/01/2020   Raynaud's disease    Recurrent falls 04/09/2021   Rheumatoid arthritis (HCC)    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   Secondary hyperaldosteronism (HCC)  05/01/2020   Septic shock (HCC) 03/10/2020   Slow transit constipation    Systemic lupus erythematosus (HCC) 05/01/2020   Thrombophilia (HCC)    Transient ischemic attack    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   Vitamin D deficiency    Past Surgical History:  Procedure Laterality Date   BREAST BIOPSY Left    CARDIAC VALVE SURGERY     CARPAL TUNNEL RELEASE     COLONOSCOPY WITH PROPOFOL N/A 12/20/2020   Procedure: COLONOSCOPY WITH PROPOFOL;  Surgeon: Willis Modena, MD;  Location: [facility] ENDOSCOPY;  Service: Endoscopy;  Laterality: N/A;   HEMORRHOID SURGERY     PACEMAKER INSERTION     RIGHT/LEFT HEART CATH AND CORONARY ANGIOGRAPHY N/A 11/27/2020   Procedure: RIGHT/LEFT HEART CATH AND CORONARY ANGIOGRAPHY;  Surgeon: Dolores Patty, MD;  Location: [facility] INVASIVE CV LAB;  Service: Cardiovascular;  Laterality: N/A;   TONSILLECTOMY       A IV Location/Drains/Wounds Patient Lines/Drains/Airways Status     Active Line/Drains/Airways     Name Placement date Placement time Site Days   Peripheral IV 07/01/22 20 G Left Forearm 07/01/22  2353  Forearm  1   Peripheral IV 07/01/22 20 G Right Forearm 07/01/22  2354  Forearm  1   Peripheral IV 07/02/22 20 G Left Antecubital 07/02/22  0400  Antecubital  less than 1            Intake/Output Last 24 hours  Intake/Output Summary (Last 24 hours) at 07/02/2022 0720 Last data filed at 07/02/2022 0242 Gross per 24 hour  Intake 500 ml  Output --  Net 500 ml    Labs/Imaging Results for orders placed or performed during the hospital encounter of 07/01/22 (from the past 48 hour(s))  CBC with Differential     Status: Abnormal   Collection Time: 07/01/22 11:49 PM  Result Value Ref Range   WBC 7.6 4.0 - 10.5 K/uL   RBC 3.24 (L) 3.87 - 5.11 MIL/uL   Hemoglobin 10.5 (L) 12.0 - 15.0 g/dL   HCT 16.1 (L) 09.6 - 04.5 %   MCV 100.6 (H) 80.0 - 100.0 fL   MCH 32.4 26.0 - 34.0 pg   MCHC 32.2 30.0 - 36.0 g/dL   RDW 40.9 81.1 - 91.4 %   Platelets 143 (L)  150 - 400 K/uL   nRBC 0.0 0.0 - 0.2 %   Neutrophils Relative % 76 %   Neutro Abs 5.8 1.7 - 7.7 K/uL   Lymphocytes Relative 17 %   Lymphs Abs 1.3 0.7 - 4.0 K/uL   Monocytes Relative 6 %   Monocytes Absolute 0.4 0.1 - 1.0 K/uL   Eosinophils Relative 1 %   Eosinophils Absolute 0.1 0.0 - 0.5 K/uL   Basophils Relative 0 %   Basophils Absolute 0.0 0.0 - 0.1 K/uL   Immature Granulocytes 0 %   Abs Immature Granulocytes 0.02 0.00 - 0.07 K/uL    Comment: Performed at [facility], [address], [place], [place] [address]  Comprehensive metabolic panel     Status: Abnormal   Collection Time: 07/01/22 11:49 PM  Result Value Ref Range   Sodium 134 (L) 135 - 145 mmol/L   Potassium 4.4 3.5 - 5.1 mmol/L  Chloride 100 98 - 111 mmol/L   CO2 24 22 - 32 mmol/L   Glucose, Bld 138 (H) 70 - 99 mg/dL    Comment: Glucose reference range applies only to samples taken after fasting for at least 8 hours.   BUN 27 (H) 8 - 23 mg/dL   Creatinine, Ser 1.61 (H) 0.44 - 1.00 mg/dL   Calcium 9.1 8.9 - 09.6 mg/dL   Total Protein 7.9 6.5 - 8.1 g/dL   Albumin 3.4 (L) 3.5 - 5.0 g/dL   AST 36 15 - 41 U/L   ALT 25 0 - 44 U/L   Alkaline Phosphatase 73 38 - 126 U/L   Total Bilirubin 0.6 0.3 - 1.2 mg/dL   GFR, Estimated 49 (L) >60 mL/min    Comment: (NOTE) Calculated using the CKD-EPI Creatinine Equation (2021)    Anion gap 10 5 - 15    Comment: Performed at [facility], [address], [place], [place] [address]  Type and screen     Status: None (Preliminary result)   Collection Time: 07/01/22 11:49 PM  Result Value Ref Range   ABO/RH(D) A NEG    Antibody Screen POS    Sample Expiration 07/04/2022,2359    Antibody Identification ANTI D    DAT, IgG NEG    Unit Number [id number]    Blood Component Type RED CELLS,LR    Unit division 00    Status of Unit ISSUED    Transfusion Status OK TO TRANSFUSE    Crossmatch Result COMPATIBLE    Unit Number [id number]    Blood Component Type RED  CELLS,LR    Unit division 00    Status of Unit ALLOCATED    Transfusion Status OK TO TRANSFUSE    Crossmatch Result COMPATIBLE    Unit Number [id number]    Blood Component Type RED CELLS,LR    Unit division 00    Status of Unit ISSUED    Transfusion Status OK TO TRANSFUSE    Crossmatch Result COMPATIBLE    Unit Number [id number]    Blood Component Type RBC LR PHER1    Unit division 00    Status of Unit ALLOCATED    Transfusion Status OK TO TRANSFUSE    Crossmatch Result COMPATIBLE    Unit Number [id number]    Blood Component Type RED CELLS,LR    Unit division 00    Status of Unit ALLOCATED    Transfusion Status OK TO TRANSFUSE    Crossmatch Result COMPATIBLE   Protime-INR     Status: Abnormal   Collection Time: 07/01/22 11:49 PM  Result Value Ref Range   Prothrombin Time 34.6 (H) 11.4 - 15.2 seconds   INR 3.4 (H) 0.8 - 1.2    Comment: (NOTE) INR goal varies based on device and disease states. Performed at [facility], [address], [place], [place] [address]   Urinalysis, w/ Reflex to Culture (Infection Suspected) -Urine, Clean Catch     Status: Abnormal   Collection Time: 07/02/22 12:52 AM  Result Value Ref Range   Specimen Source URINE, CLEAN CATCH    Color, Urine STRAW (A) YELLOW   APPearance CLEAR CLEAR   Specific Gravity, Urine 1.005 1.005 - 1.030   pH 7.0 5.0 - 8.0   Glucose, UA NEGATIVE NEGATIVE mg/dL   Hgb urine dipstick NEGATIVE NEGATIVE   Bilirubin Urine NEGATIVE NEGATIVE   Ketones, ur NEGATIVE NEGATIVE mg/dL   Protein, ur NEGATIVE NEGATIVE mg/dL   Nitrite NEGATIVE NEGATIVE  Leukocytes,Ua NEGATIVE NEGATIVE   RBC / HPF 0-5 0 - 5 RBC/hpf   WBC, UA 0-5 0 - 5 WBC/hpf    Comment:        Reflex urine culture not performed if WBC <=10, OR if Squamous epithelial cells >5. If Squamous epithelial cells >5 suggest recollection.    Bacteria, UA NONE SEEN NONE SEEN   Squamous Epithelial / HPF 0-5 0 - 5 /HPF   Hyaline Casts, UA PRESENT      Comment: Performed at [facility], [address] [place], [place] [address]  Prepare RBC (crossmatch)     Status: None   Collection Time: 07/02/22  3:11 AM  Result Value Ref Range   Order Confirmation      BB SAMPLE OR UNITS ALREADY AVAILABLE Performed at [facility], [address] [place], [place] [address]   Hemoglobin and hematocrit, blood     Status: Abnormal   Collection Time: 07/02/22  3:23 AM  Result Value Ref Range   Hemoglobin 7.0 (L) 12.0 - 15.0 g/dL    Comment: REPEATED TO VERIFY   HCT 21.5 (L) 36.0 - 46.0 %    Comment: Performed at [facility], [address] [place], [place] [address]  Prepare RBC (crossmatch)     Status: None   Collection Time: 07/02/22  4:30 AM  Result Value Ref Range   Order Confirmation      ORDER PROCESSED BY BLOOD BANK Performed at [facility], [address] [place], [place] [address]   CBG monitoring, ED     Status: Abnormal   Collection Time: 07/02/22  6:01 AM  Result Value Ref Range   Glucose-Capillary 139 (H) 70 - 99 mg/dL    Comment: Glucose reference range applies only to samples taken after fasting for at least 8 hours.   CT ANGIO GI BLEED  Result Date: 07/02/2022 CLINICAL DATA:  Mesenteric ischemia. Diarrhea and blood in stool. On Xarelto. Abdominal cramping. EXAM: CTA ABDOMEN AND PELVIS WITHOUT AND WITH CONTRAST TECHNIQUE: Multidetector CT imaging of the abdomen and pelvis was performed using the standard protocol during bolus administration of intravenous contrast. Multiplanar reconstructed images and MIPs were obtained and reviewed to evaluate the vascular anatomy. RADIATION DOSE REDUCTION: This exam was performed according to the departmental dose-optimization program which includes automated exposure control, adjustment of the mA and/or kV according to patient size and/or use of iterative reconstruction technique. CONTRAST:  OMNIPAQUE IOHEXOL 350 MG/ML SOLN COMPARISON:  CT 12/13/2020 FINDINGS:  VASCULAR Aorta: Mild aortic atherosclerotic calcification. No aneurysm or dissection. Celiac: Calcified plaque at the origin causes moderate narrowing. No aneurysm or dissection. SMA: Patent without aneurysm or dissection. Renals: Single right and 2 left renal arteries. Patent without aneurysm or dissection. IMA: Patent. Inflow: Patent without aneurysm or dissection. Proximal Outflow: Patent without aneurysm or dissection. Veins: Patent portal vein and IVC. Review of the MIP images confirms the above findings. NON-VASCULAR Lower chest: Cardiomegaly. Cardiac valve prostheses. Interstitial lung disease in the lung bases. Hepatobiliary: Cholelithiasis without evidence of cholecystitis. Unremarkable liver and biliary tree. Pancreas: Unremarkable. Spleen: Unremarkable. Adrenals/Urinary Tract: Stable adrenal glands. No urinary calculi or hydronephrosis. Unremarkable bladder. Stomach/Bowel: Intraluminal hyperdensity on arterial phase images which diffuses on portal venous phase near the descending colon/sigmoid colon junction (series 6/image 93 and 12/55) compatible with active GI bleed. Mild distention of the rectum and sigmoid colon downstream from the area of active bleeding with heterogenous intraluminal fluid compatible with hemorrhage and  stool. Sigmoid colon and descending colon diverticulosis without diverticulitis. The jejunum in the left upper quadrant is distended with fluid and demonstrates wall thickening and hyperenhancement. There is smooth tapering distally without abrupt transition point to suggest obstruction. Findings are suggestive of enteritis. Lymphatic: No lymphadenopathy. Reproductive: Calcified fibroids in the uterus.  No adnexal mass. Other: No free intraperitoneal fluid or air. Musculoskeletal: No acute fracture. Grade 2 anterolisthesis of L4 on L5 with advanced disc space height loss and degenerative endplate changes at L4-L5. Chronic compression deformity of L3 is unchanged. Intermediate  density irregular fluid collection in the right posterolateral gluteal soft tissues (circa series 6/image 128) measuring 3.1 x 1.9 cm is suspicious for hematoma. No evidence of active bleeding. IMPRESSION: 1. Active GI bleed at the descending colon/sigmoid colon junction. This likely represents active diverticular hemorrhage. 2. Enteritis involving the jejunum in the left upper quadrant. 3. Small hematoma in the right gluteal fat without evidence of active bleeding. Aortic Atherosclerosis (ICD10-I70.0). Critical Value/emergent results were called by telephone at the time of interpretation on 07/02/2022 at 2:30 am to provider [facility] , who verbally acknowledged these results. Electronically Signed   By: Minerva Fester M.D.   On: 07/02/2022 02:33   XR Ankle Complete Left  Result Date: 07/01/2022 AP lateral mortise radiographs left ankle reviewed.  Lateral malleolar fracture unchanged with slight displacement but minimal shortening.  Mortise is symmetric.  Some callus formation is present but it is minimal.   Pending Labs Unresulted Labs (From admission, onward)     Start     Ordered   07/02/22 0900  Hemoglobin and hematocrit, blood  Now then every 6 hours,   R (with STAT occurrences)      07/02/22 0402   07/02/22 0543  Hemoglobin A1c  Once,   R       Comments: To assess prior glycemic control    07/02/22 0542   07/02/22 0500  CBC  Tomorrow morning,   R        07/02/22 0456   07/02/22 0500  Basic metabolic panel  Tomorrow morning,   R        07/02/22 0456   07/02/22 0500  Magnesium  Tomorrow morning,   R        07/02/22 0456   07/02/22 0500  Phosphorus  Tomorrow morning,   R        07/02/22 0456            Vitals/Pain Today's Vitals   07/02/22 0612 07/02/22 0615 07/02/22 0634 07/02/22 0645  BP: (!) 91/57 (!) 86/58 (!) 124/57 118/67  Pulse: 70  69 72  Resp: 16 (!) 23 18 (!) 26  Temp:   97.7 F (36.5 C) 97.6 F (36.4 C)  TempSrc:   Oral Oral  SpO2: 100%  100% 100%  Weight:       Height:      PainSc:    0-No pain    Isolation Precautions No active isolations  Medications Medications  lactated ringers infusion (0 mLs Intravenous Stopped 07/02/22 0100)  norepinephrine (LEVOPHED) 4mg  in (0.016 mg/mL) premix infusion (5 mcg/min Intravenous Rate/Dose Change 07/02/22 0635)  Budeson-Glycopyrrol-Formoterol 160-9-4.8 MCG/ACT AERO 2 puff (has no administration in time range)  docusate sodium (COLACE) capsule 100 mg (has no administration in time range)  polyethylene glycol (MIRALAX / GLYCOLAX) packet 17 g (has no administration in time range)  insulin aspart (novoLOG) injection 0-9 Units (1 Units Subcutaneous Given 07/02/22 0606)  sodium chloride 0.9 %  bolus 500 mL (0 mLs Intravenous Stopped 07/02/22 0242)  iohexol (OMNIPAQUE) 350 MG/ML injection 100 mL (100 mLs Intravenous Contrast Given 07/02/22 0204)  prothrombin complex conc human (KCENTRA) IVPB 2,654 Units (0 Units Intravenous Stopped 07/02/22 0354)  0.9 %  sodium chloride infusion (Manually program via Guardrails IV Fluids) (0 mLs Intravenous Stopped 07/02/22 0354)  0.9 %  sodium chloride infusion (Manually program via Guardrails IV Fluids) (0 mLs Intravenous Stopped 07/02/22 0548)  phytonadione (VITAMIN K) 10 mg in dextrose 5 % 50 mL IVPB (0 mg Intravenous Stopped 07/02/22 0548)  calcium gluconate 2 g/ 100 mL sodium chloride IVPB (0 mg Intravenous Stopped 07/02/22 0713)    Mobility non-ambulatory     Focused Assessments Cardiac Assessment Handoff:  Cardiac Rhythm: Atrial fibrillation No results found for: "CKTOTAL", "CKMB", "CKMBINDEX", "TROPONINI" Lab Results  Component Value Date   DDIMER 0.35 12/26/2021   Does the Patient currently have chest pain? No    R Recommendations: See Admitting Provider Note  Report given to:   Additional Notes:

## 2022-07-02 NOTE — Progress Notes (Signed)
MD notified of frank red urine in canister with no bloody BM observed. Orders received for foley catheter. Foley catheter placed without issue and yellow/straw urine noted in the foley bag. No bleeding noted at this time.

## 2022-07-02 NOTE — Telephone Encounter (Signed)
-----   Message from Cammy Copa, MD sent at 07/01/2022 10:01 PM EDT ----- Cresenciano Lick can you have [place] [facility] follow-up in 2 weeks to see [facility].  Thanks

## 2022-07-02 NOTE — Progress Notes (Signed)
INR still supra therapeutic Hgb improved Note reviewed from GI  Plan Will give Vit K   Simonne Martinet ACNP-BC [facility] Pulmonary/Critical Care Pager # [phone] OR # [phone] if no answer .

## 2022-07-02 NOTE — Progress Notes (Signed)
eLink Physician-Brief Progress Note Patient Name: Meshia Rau DOB: December 28, 1945 MRN: [id number]   Date of Service  07/02/2022  HPI/Events of Note  77 year old female that initially presented with acute lower GI bleed on home Xarelto for A-fib with suspected diverticular bleed and hemorrhagic shock.  Patient voided and had frank hematuria.  He is receiving vitamin Kcentra for elevated INR, received Kcentra.  eICU Interventions  Will check DIC labs with next set of hemoglobin/INR checks.  Insert three-way Foley to gravity drainage for now.     0442 -a.m. INR 1.8, already received Kcentra now and skip 2 doses.  Per Society of interventional radiology, no further action indicated at this time.  A.m. hemoglobin 7.3, will order 1 unit transfusion and 1 unit to hold.  Intervention Category Intermediate Interventions: Bleeding - evaluation and treatment with blood products  Johnnae Impastato 07/02/2022, 10:38 PM

## 2022-07-03 DIAGNOSIS — K922 Gastrointestinal hemorrhage, unspecified: Secondary | ICD-10-CM | POA: Diagnosis not present

## 2022-07-03 LAB — HEMOGLOBIN AND HEMATOCRIT, BLOOD
HCT: 22.1 % — ABNORMAL LOW (ref 36.0–46.0)
HCT: 25.7 % — ABNORMAL LOW (ref 36.0–46.0)
HCT: 24.6 % — ABNORMAL LOW (ref 36.0–46.0)
Hemoglobin: 7.3 g/dL — ABNORMAL LOW (ref 12.0–15.0)
Hemoglobin: 8.7 g/dL — ABNORMAL LOW (ref 12.0–15.0)
Hemoglobin: 8.2 g/dL — ABNORMAL LOW (ref 12.0–15.0)

## 2022-07-03 LAB — PROTIME-INR
INR: 1.8 — ABNORMAL HIGH (ref 0.8–1.2)
Prothrombin Time: 21.2 seconds — ABNORMAL HIGH (ref 11.4–15.2)

## 2022-07-03 LAB — GLUCOSE, CAPILLARY
Glucose-Capillary: 108 mg/dL — ABNORMAL HIGH (ref 70–99)
Glucose-Capillary: 108 mg/dL — ABNORMAL HIGH (ref 70–99)
Glucose-Capillary: 105 mg/dL — ABNORMAL HIGH (ref 70–99)
Glucose-Capillary: 121 mg/dL — ABNORMAL HIGH (ref 70–99)
Glucose-Capillary: 149 mg/dL — ABNORMAL HIGH (ref 70–99)
Glucose-Capillary: 104 mg/dL — ABNORMAL HIGH (ref 70–99)

## 2022-07-03 LAB — BASIC METABOLIC PANEL
Anion gap: 11 (ref 5–15)
BUN: 25 mg/dL — ABNORMAL HIGH (ref 8–23)
CO2: 23 mmol/L (ref 22–32)
Calcium: 7.9 mg/dL — ABNORMAL LOW (ref 8.9–10.3)
Chloride: 106 mmol/L (ref 98–111)
Creatinine, Ser: 1.44 mg/dL — ABNORMAL HIGH (ref 0.44–1.00)
GFR, Estimated: 38 mL/min — ABNORMAL LOW (ref >60)
Glucose, Bld: 114 mg/dL — ABNORMAL HIGH (ref 70–99)
Potassium: 4.1 mmol/L (ref 3.5–5.1)
Sodium: 140 mmol/L (ref 135–145)

## 2022-07-03 LAB — BPAM RBC
Blood Product Expiration Date: 202406142359
Unit Type and Rh: 600
Unit Type and Rh: 600
Unit Type and Rh: 600

## 2022-07-03 LAB — TYPE AND SCREEN: Unit division: 0

## 2022-07-03 LAB — DIC (DISSEMINATED INTRAVASCULAR COAGULATION)PANEL
D-Dimer, Quant: 0.58 ug/mL-FEU — ABNORMAL HIGH (ref 0.00–0.50)
Fibrinogen: 256 mg/dL (ref 210–475)
INR: 1.6 — ABNORMAL HIGH (ref 0.8–1.2)
Platelets: 75 10*3/uL — ABNORMAL LOW (ref 150–400)
Prothrombin Time: 19.1 seconds — ABNORMAL HIGH (ref 11.4–15.2)
Smear Review: NONE SEEN
aPTT: 33 seconds (ref 24–36)

## 2022-07-03 LAB — PREPARE RBC (CROSSMATCH)

## 2022-07-03 MED ORDER — SODIUM CHLORIDE 0.9% IV SOLUTION: Freq: Once | INTRAVENOUS | Status: AC

## 2022-07-03 NOTE — Progress Notes (Signed)
NAME:  Lindsey Huber, MRN:  [id number], DOB:  1945/05/07, LOS: 1 ADMISSION DATE:  07/01/2022, CONSULTATION DATE:  07/03/22 REFERRING MD:  EDP, CHIEF COMPLAINT:  GIB   History of Present Illness:  Lindsey Huber is a 77 y.o. 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  She was initially hemodynamically stable with a Hgb of 10.5.  She had another BM of bright blood with clots; and CTA showed active GIB at the descending colon/sigmoid colon junction and enteritis of the jejunum.   Her Hgb dropped to 7.0 with hypotension, and three units PRBC's and Levophed, Kcentra and vitamin K were ordered and IR was consulted.  PCCM asked to admit.     Pertinent  Medical History   has a past medical history of Achalasia, Acquired hypothyroidism (08/20/2020), Acute metabolic encephalopathy (07/25/2021), AICD (automatic cardioverter/defibrillator) present (11/14/2019), Allergic rhinitis, Ankle fracture (03/25/2022), Aortic valve disorder (03/26/2002), Atherosclerosis of abdominal aorta (HCC) (05/01/2020), Cardiomyopathy (HCC), CHB (complete heart block) (HCC) (08/18/2017), CHF (congestive heart failure) (HCC), Cholelithiasis without obstruction (05/01/2020), Chronic gouty arthritis, Chronic heart failure with preserved ejection fraction (HCC) (05/01/2020), Chronic kidney disease (CKD), active medical management without dialysis, stage 3 (moderate) (HCC) (05/01/2020), Closed torus fracture of distal end of right radius with delayed healing (08/12/2021), Delusional thoughts (HCC), Diverticulosis of colon (05/01/2020), Epistaxis, Essential (primary) hypertension (08/18/2017), Gastroesophageal reflux disease (05/01/2020), Gastrointestinal  hemorrhage, 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 (11/14/2019), Hypercoagulability due to atrial fibrillation (HCC) (05/01/2020), Hyperlipidemia (11/14/2019), Hypocalcemia (12/14/2020), Hypoglycemia, Hypokalemia (12/14/2020), Hypomagnesemia (12/14/2020), Hypotension (12/14/2020), Idiopathic pulmonary fibrosis (HCC) (05/01/2020), 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05/01/2020), Iron deficiency anemia, Long term (current) use of anticoagulants, Malnutrition of mild degree (Gomez: 75% to less than 90% of standard weight) (HCC), Mild dementia (HCC) (08/12/2021), Mitral and aortic incompetence (11/14/2019), Non-rheumatic atrial fibrillation (HCC) (05/01/2020), Non-toxic multinodular goiter (08/20/2020), NSVT (nonsustained ventricular tachycardia) (HCC) (08/18/2017), Oropharyngeal dysphagia, Osteoarthritis of hip (05/01/2020), Osteoarthritis of knee (05/01/2020), Osteopenia of neck of left femur, Pain of left hip joint (12/12/2017), Proteinuria (05/01/2020), Raynaud's disease, Recurrent falls (04/09/2021), Rheumatoid arthritis (HCC), 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   Significant Hospital Events: Including procedures, antibiotic start and stop dates in addition to other pertinent events   6/7 admit with LGIB and hypotension, reversed with Kcentra and requiring peripheral levophed. IR consulted. No intervention other than reversal of AC recommended  Interim History / Subjective:  Feels about the same. Transfusion completed   Objective   Blood pressure (!) 97/54, pulse 69, temperature 98 F (36.7 C), resp. rate 20, height 5\' 3"  (1.6 m), weight 55.8 kg, SpO2 100 %.        Intake/Output Summary (Last 24 hours) at  07/03/2022 0732 Last data filed at 07/03/2022 4098 Gross per 24 hour  Intake 2357.92 ml  Output 460 ml  Net 1897.92 ml    Filed Weights   07/01/22 2348  Weight: 55.8 kg   General: Resting in bed currently no acute distress HEENT normocephalic atraumatic no jugular venous distention Pulmonary: Clear diminished bases no accessory use currently room air Cardiac: Regular rate and rhythm with significant systolic murmur Abdomen:  Soft nontender no organomegaly Extremities: Warm dry Neuro intact.   Resolved Hospital Problem list     Assessment & Plan:    Acute LGIB on Xarelto (POA) ABLA Hemorrhagic Shock S/p Kcentra and vitamin K Weaning NE  Plan CBC falling, monitor in ICU Titrate NE for MAP > 65 Serial CBC NPO Scheduled PPI GI consultation pending  Atrial Fibrillation HFpEF Valvular disease w/prior bioprosthetic AVR/MV ring/bioprosthetic TVR 09/25/2010  SSS s/p ppm Plan hold Lasix, bb, xarelto monitor for signs of volume overload Tele  COPD/ILD Follows with [name], no home O2. No current evidence of exacerbation Plan continue Breztri and albuterol  CKD stage 3a -at risk for worsening renal failure in the setting of shock Plan F/u chem Renal dose meds   Lupus plan hold home prednisone and plaquenil while npo  Best Practice (right click and "Reselect all SmartList Selections" daily)   Diet/type: NPO DVT prophylaxis: SCD GI prophylaxis: N/A Lines: N/A Foley:  N/A Code Status:  full code Last date of multidisciplinary goals of care discussion [discussed plan with patient, daughter and granddaughter.  They confirm full code]    Critical care time:    CRITICAL CARE Performed by: Karren Burly   Total critical care time: 31 minutes  Critical care time was exclusive of separately billable procedures and treating other patients.  Critical care was necessary to treat or prevent imminent or life-threatening deterioration.  Critical care  was time spent personally by me on the following activities: development of treatment plan with patient and/or surrogate as well as nursing, discussions with consultants, evaluation of patient's response to treatment, examination of patient, obtaining history from patient or surrogate, ordering and performing treatments and interventions, ordering and review of laboratory studies, ordering and review of radiographic studies, pulse oximetry and re-evaluation of patient's condition.  Karren Burly, MD [facility] Pulmonary/Critical Care See AMION OR # [phone] if no answer

## 2022-07-03 NOTE — Progress Notes (Signed)
eLink Physician-Brief Progress Note Patient Name: Lindsey Huber DOB: 02/27/1945 MRN: [id number]   Date of Service  07/03/2022  HPI/Events of Note  77 year old female that initially presented with acute lower GI bleed on home Xarelto for A-fib with suspected diverticular bleed and hemorrhagic shock.   CTA with evidence of bleeding of the descending colon/sigmoid colon junction.  Now eating and drinking  eICU Interventions  Discontinue D5/LR especially with rising blood glucose 149     Intervention Category Minor Interventions: Routine modifications to care plan (e.g. PRN medications for pain, fever)  Tarren Velardi 07/03/2022, 8:35 PM

## 2022-07-03 NOTE — Progress Notes (Signed)
[facility] Gastroenterology Progress Note  Lindsey Huber 77 y.o. 1945/04/11   Subjective: Resting in bed but wakes up easily. Denies abdominal pain. Reports blood in her urine. Denies rectal bleeding.   Objective: Vital signs: Vitals:   07/03/22 1000 07/03/22 1123  BP: (!) 108/56   Pulse: 69   Resp: (!) 29   Temp:  99 F (37.2 C)  SpO2: 100%     Physical Exam: Gen: lethargic, thin, elderly, frail, no acute distress  HEENT: anicteric sclera CV: RRR Chest: CTA B Abd: lower abdominal tenderness with guarding, soft, nondistended, +BS Ext: no edema  Lab Results: Recent Labs    07/02/22 0940 07/03/22 0938  NA 137 140  K 3.7 4.1  CL 106 106  CO2 22 23  GLUCOSE 113* 114*  BUN 24* 25*  CREATININE 1.11* 1.44*  CALCIUM 8.5* 7.9*  MG 1.8  --   PHOS 5.2*  --    Recent Labs    07/01/22 2349  AST 36  ALT 25  ALKPHOS 73  BILITOT 0.6  PROT 7.9  ALBUMIN 3.4*   Recent Labs    07/01/22 2349 07/02/22 0323 07/02/22 0940 07/02/22 1532 07/03/22 0347 07/03/22 0938 07/03/22 0941  WBC 7.6  --  10.2  --   --   --   --   NEUTROABS 5.8  --   --   --   --   --   --   HGB 10.5*   < > 9.6*   < > 7.3* 8.7*  --   HCT 32.6*   < > 28.6*   < > 22.1* 25.7*  --   MCV 100.6*  --  86.1  --   --   --   --   PLT 143*  --  81*  --   --   --  75*   < > = values in this interval not displayed.      Assessment/Plan: Lower GI bleed - CTA positive for active bleeding at the descending colon/sigmoid colon junction. Hgb 8.7. INR corrected to 1.6. Not showing signs of active GI bleeding. Hematuria management by CCM. If active GIB develops, then needs IR for embolization.   Lindsey Huber 07/03/2022, 12:01 PM  Questions please call [phone] ID: Lindsey Huber, female   DOB: [birth year], 77 y.o.   MRN: [id number]

## 2022-07-04 DIAGNOSIS — I4819 Other persistent atrial fibrillation: Secondary | ICD-10-CM

## 2022-07-04 DIAGNOSIS — K5791 Diverticulosis of intestine, part unspecified, without perforation or abscess with bleeding: Secondary | ICD-10-CM | POA: Diagnosis not present

## 2022-07-04 DIAGNOSIS — N183 Chronic kidney disease, stage 3 unspecified: Secondary | ICD-10-CM

## 2022-07-04 DIAGNOSIS — D62 Acute posthemorrhagic anemia: Secondary | ICD-10-CM | POA: Insufficient documentation

## 2022-07-04 DIAGNOSIS — I442 Atrioventricular block, complete: Secondary | ICD-10-CM

## 2022-07-04 DIAGNOSIS — E2749 Other adrenocortical insufficiency: Secondary | ICD-10-CM

## 2022-07-04 DIAGNOSIS — E861 Hypovolemia: Secondary | ICD-10-CM

## 2022-07-04 DIAGNOSIS — D6869 Other thrombophilia: Secondary | ICD-10-CM

## 2022-07-04 DIAGNOSIS — I10 Essential (primary) hypertension: Secondary | ICD-10-CM

## 2022-07-04 HISTORY — DX: Acute posthemorrhagic anemia: D62

## 2022-07-04 LAB — CBC
HCT: 23 % — ABNORMAL LOW (ref 36.0–46.0)
Hemoglobin: 7.8 g/dL — ABNORMAL LOW (ref 12.0–15.0)
MCH: 28.8 pg (ref 26.0–34.0)
MCHC: 33.9 g/dL (ref 30.0–36.0)
MCV: 84.9 fL (ref 80.0–100.0)
Platelets: 81 10*3/uL — ABNORMAL LOW (ref 150–400)
RBC: 2.71 MIL/uL — ABNORMAL LOW (ref 3.87–5.11)
RDW: 22.5 % — ABNORMAL HIGH (ref 11.5–15.5)
WBC: 11.8 10*3/uL — ABNORMAL HIGH (ref 4.0–10.5)
nRBC: 1.1 % — ABNORMAL HIGH (ref 0.0–0.2)

## 2022-07-04 LAB — FERRITIN: Ferritin: 545 ng/mL — ABNORMAL HIGH (ref 11–307)

## 2022-07-04 LAB — BPAM RBC
Blood Product Expiration Date: 202406222359
Blood Product Expiration Date: 202406262359
ISSUE DATE / TIME: 202406070545

## 2022-07-04 LAB — TYPE AND SCREEN
Antibody Screen: POSITIVE
DAT, IgG: NEGATIVE
Unit division: 0
Unit division: 0
Unit division: 0

## 2022-07-04 LAB — GLUCOSE, CAPILLARY
Glucose-Capillary: 116 mg/dL — ABNORMAL HIGH (ref 70–99)
Glucose-Capillary: 80 mg/dL (ref 70–99)
Glucose-Capillary: 127 mg/dL — ABNORMAL HIGH (ref 70–99)
Glucose-Capillary: 147 mg/dL — ABNORMAL HIGH (ref 70–99)

## 2022-07-04 LAB — RETICULOCYTES
Immature Retic Fract: 28.5 % — ABNORMAL HIGH (ref 2.3–15.9)
RBC.: 2.63 MIL/uL — ABNORMAL LOW (ref 3.87–5.11)
Retic Count, Absolute: 73.6 10*3/uL (ref 19.0–186.0)
Retic Ct Pct: 2.8 % (ref 0.4–3.1)

## 2022-07-04 LAB — IRON AND TIBC
Iron: 18 ug/dL — ABNORMAL LOW (ref 28–170)
Saturation Ratios: 11 % (ref 10.4–31.8)
TIBC: 165 ug/dL — ABNORMAL LOW (ref 250–450)
UIBC: 147 ug/dL

## 2022-07-04 LAB — BASIC METABOLIC PANEL
Anion gap: 11 (ref 5–15)
BUN: 21 mg/dL (ref 8–23)
CO2: 23 mmol/L (ref 22–32)
Calcium: 8.4 mg/dL — ABNORMAL LOW (ref 8.9–10.3)
Chloride: 102 mmol/L (ref 98–111)
Creatinine, Ser: 1.4 mg/dL — ABNORMAL HIGH (ref 0.44–1.00)
GFR, Estimated: 39 mL/min — ABNORMAL LOW (ref >60)
Glucose, Bld: 109 mg/dL — ABNORMAL HIGH (ref 70–99)
Potassium: 3.5 mmol/L (ref 3.5–5.1)
Sodium: 136 mmol/L (ref 135–145)

## 2022-07-04 LAB — HEMOGLOBIN AND HEMATOCRIT, BLOOD
HCT: 23.3 % — ABNORMAL LOW (ref 36.0–46.0)
HCT: 21.8 % — ABNORMAL LOW (ref 36.0–46.0)
Hemoglobin: 7.7 g/dL — ABNORMAL LOW (ref 12.0–15.0)
Hemoglobin: 7 g/dL — ABNORMAL LOW (ref 12.0–15.0)

## 2022-07-04 LAB — FOLATE: Folate: 12 ng/mL (ref >5.9)

## 2022-07-04 LAB — VITAMIN B12: Vitamin B-12: 1390 pg/mL — ABNORMAL HIGH (ref 180–914)

## 2022-07-04 MED ORDER — PREDNISONE 5 MG PO TABS
10.0000 mg | ORAL_TABLET | Freq: Every day | ORAL | Status: DC
  Administered 2022-07-04 – 2022-07-10 (×7): 10 mg via ORAL
  Filled 2022-07-04 (×3): qty 2
  Filled 2022-07-04 (×2): qty 1
  Filled 2022-07-04 (×2): qty 2

## 2022-07-04 MED ORDER — NINTEDANIB ESYLATE 150 MG PO CAPS: 150.0000 mg | ORAL_CAPSULE | Freq: Every day | ORAL | Status: DC

## 2022-07-04 MED ORDER — HYDROXYCHLOROQUINE SULFATE 200 MG PO TABS
200.0000 mg | ORAL_TABLET | Freq: Every day | ORAL | Status: DC
  Administered 2022-07-04 – 2022-07-10 (×7): 200 mg via ORAL
  Filled 2022-07-04 (×7): qty 1

## 2022-07-04 MED ORDER — LEVOTHYROXINE SODIUM 25 MCG PO TABS
25.0000 ug | ORAL_TABLET | Freq: Every day | ORAL | Status: DC
  Administered 2022-07-04 – 2022-07-10 (×7): 25 ug via ORAL
  Filled 2022-07-04 (×7): qty 1

## 2022-07-04 MED ORDER — OLANZAPINE 5 MG PO TABS
2.5000 mg | ORAL_TABLET | Freq: Every day | ORAL | Status: DC
  Administered 2022-07-04 – 2022-07-09 (×6): 2.5 mg via ORAL
  Filled 2022-07-04 (×6): qty 1

## 2022-07-04 MED ORDER — ALLOPURINOL 100 MG PO TABS
100.0000 mg | ORAL_TABLET | Freq: Every day | ORAL | Status: DC
  Administered 2022-07-04 – 2022-07-10 (×7): 100 mg via ORAL
  Filled 2022-07-04 (×7): qty 1

## 2022-07-04 NOTE — Progress Notes (Signed)
PROGRESS NOTE  Lindsey Huber ZOX:[id number] DOB: 1945-09-30   PCP: Collene Mares, PA  Patient is from: Home.  Lives with daughter.  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  Hgb 10.5 and dropped to 7.0 the next morning.  She became hypotensive.  Transfused 3 units, received Kcentra, vitamin K and started on basal pressors and admitted to ICU.  CT angio showed extravasation at the junction of descending and sigmoid colon and duodenal enteritis.  GI and IR consulted.  Patient was stabilized and came off vasopressor, and transferred to [facility] service on 6/9.  GI following   Subjective: Seen and examined earlier this morning.  No major events overnight of this morning.  No further bowel movement or bleeding.  Feels tired but denies chest pain, dyspnea or dizziness.  Daughter at bedside.  Objective: Vitals:   07/04/22 0630 07/04/22 0746 07/04/22 0803 07/04/22 1131  BP: (!) 98/51     Pulse: 69     Resp: (!) 28     Temp:   99.6 F (37.6 C) 98.4 F (36.9 C)  TempSrc:   Axillary Axillary  SpO2: 100% 100%    Weight:      Height:        Examination:  GENERAL: No apparent distress.  Appears well. HEENT: MMM.  Vision and hearing grossly intact.  Conjunctival palor NECK: Supple.  No apparent JVD.  RESP:  No IWOB.  Fair aeration bilaterally. CVS:  RRR.  Mechanical heart sound. ABD/GI/GU: BS+. Abd soft, NTND.  MSK/EXT:  Moves extremities.  Left  foot edema/swelling SKIN: no apparent skin lesion or wound NEURO: Awake, alert and oriented appropriately.  No apparent focal neuro deficit. PSYCH: Calm. Normal affect.   Procedures:  None  Microbiology summarized: MRSA PCR screen nonreactive  Assessment and plan: Principal Problem:   GIB (gastrointestinal bleeding) Active Problems:   Essential (primary) hypertension   Hypercoagulability due to atrial fibrillation   Chronic heart failure with preserved ejection fraction   Chronic kidney disease (CKD), active medical management without dialysis, stage 3 (moderate)   Secondary adrenal insufficiency   Hypotension   CHB (complete heart block)   Hemorrhagic shock (HCC)   Acute blood loss anemia  Hemorrhagic shock due to ABLA in the setting of acute LGIB on Xarelto (POA): Required vasopressor and ICU stay.  Transfused 3 units with improvement in Hgb. S/p   Kcentra and vitamin K.  No further bleeding.  Soft blood pressures.  Hgb trended down partly due to IV fluid.  Anemia panel without iron deficiency.  No history of CAD. Recent Labs    07/01/22 2349 07/02/22 0323 07/02/22 0940 07/02/22 1532 07/02/22 2107 07/03/22 0347 07/03/22 0938 07/03/22 1446 07/04/22 0128 07/04/22 0948  HGB 10.5* 7.0* 9.6* 9.5* 8.9* 7.3* 8.7* 8.2* 7.8* 7.7*  -Continue monitoring H&H -Continue PPI -GI following and recommended IR consult for embolization if further bleed -Advance to full liquid diet   Atrial Fibrillation/SSS s/p PPM: EKG with ventricular paced rhythm. -Xarelto discontinued due to  GI bleed -Hold metoprolol due to soft blood pressure -Optimize electrolytes  Chronic HFpEF: Appears euvolemic except for left foot edema.  No cardiopulmonary symptoms. -Hold diuretics -IV fluid discontinued overnight. -Strict intake and output  Valvular disease w/prior bioprosthetic AVR/MV ring/bioprosthetic TVR 09/25/2010    COPD/ILD/pulmonary fibrosis: Follows with [name], no home O2. No current  evidence of exacerbation -Resume home prednisone at 10 mg daily -Continue home Plaquenil -Resume home Ofev -Continue inhalers  CKD stage 3A: Cr slightly higher than baseline likely due to hypotension. Recent Labs    12/04/21 1436 12/26/21 1350 12/27/21 0109 12/28/21 0052 12/29/21 0459 05/03/22 1519 07/01/22 2349 07/02/22 0940 07/03/22 0938 07/04/22 0128  BUN 32* 39* 35* 33* 31* 28* 27* 24* 25* 21  CREATININE 1.19* 1.22* 1.14* 1.46* 1.04* 1.11* 1.16* 1.11* 1.44* 1.40*  -Continue monitoring  Hypotension: Likely due to hemorrhagic shock.  Cannot exclude advanced hypertensive. -Start prednisone at 10 mg daily.  Takes 5 mg daily at home -Continue holding cardiac meds  Lupus -Resume prednisone and plaquenil   Generalized weakness/debility Recent left ankle fracture: Per daughter, NWB.  Uses wheelchair since ankle fracture. -PT/OT consult  Thrombocytopenia: Improving Recent Labs  Lab 07/01/22 2349 07/02/22 0940 07/03/22 0941 07/04/22 0128  PLT 143* 81* 75* 81*  -Continue monitoring   Body mass index is 22.3 kg/m.           DVT prophylaxis:  SCDs Start: 07/02/22 0455  Code Status: Full code Family Communication: Updated patient's daughter at bedside Level of care: Progressive Status is: Inpatient Remains inpatient appropriate because: Acute blood loss anemia   Final disposition: TBD Consultants:  Pulmonology admitted patient Gastroenterology IR  55 minutes with more than 50% spent in reviewing records, counseling patient/family and coordinating care.   Sch Meds:  Scheduled Meds:  allopurinol  100 mg Oral Daily   Chlorhexidine Gluconate Cloth  6 each Topical Daily   hydroxychloroquine  200 mg Oral Daily   insulin aspart  0-9 Units Subcutaneous Q4H   levothyroxine  25 mcg Oral Q0600   mometasone-formoterol  2 puff Inhalation BID   OLANZapine  2.5 mg Oral QHS   pantoprazole (PROTONIX) IV  40 mg Intravenous Q12H   predniSONE  10 mg Oral Q  breakfast   umeclidinium bromide  1 puff Inhalation Daily   Continuous Infusions:  norepinephrine (LEVOPHED) Adult infusion Stopped (07/02/22 1412)   PRN Meds:.docusate sodium, mouth rinse, polyethylene glycol  Antimicrobials: Anti-infectives (From admission, onward)    Start     Dose/Rate Route Frequency Ordered Stop   07/04/22 1000  hydroxychloroquine (PLAQUENIL) tablet 200 mg        200 mg Oral Daily 07/04/22 0717          I have personally reviewed the following labs and images: CBC: Recent Labs  Lab 07/01/22 2349 07/02/22 0323 07/02/22 0940 07/02/22 1532 07/03/22 0347 07/03/22 0938 07/03/22 0941 07/03/22 1446 07/04/22 0128 07/04/22 0948  WBC 7.6  --  10.2  --   --   --   --   --  11.8*  --   NEUTROABS 5.8  --   --   --   --   --   --   --   --   --   HGB 10.5*   < > 9.6*   < > 7.3* 8.7*  --  8.2* 7.8* 7.7*  HCT 32.6*   < > 28.6*   < > 22.1* 25.7*  --  24.6* 23.0* 23.3*  MCV 100.6*  --  86.1  --   --   --   --   --  84.9  --   PLT 143*  --  81*  --   --   --  75*  --  81*  --    < > = values in this interval not displayed.   BMP &GFR Recent Labs  Lab 07/01/22 2349 07/02/22 0940 07/03/22 0938 07/04/22 0128  NA 134* 137 140 136  K 4.4 3.7 4.1 3.5  CL 100 106 106 102  CO2 24 22 23 23   GLUCOSE 138* 113* 114* 109*  BUN 27* 24* 25* 21  CREATININE 1.16* 1.11* 1.44* 1.40*  CALCIUM 9.1 8.5* 7.9* 8.4*  MG  --  1.8  --   --   PHOS  --  5.2*  --   --    Estimated Creatinine Clearance: 28.7 mL/min (A) (by C-G formula based on SCr of 1.4 mg/dL (H)). Liver & Pancreas: Recent Labs  Lab 07/01/22 2349  AST 36  ALT 25  ALKPHOS 73  BILITOT 0.6  PROT 7.9  ALBUMIN 3.4*   No results for input(s): "LIPASE", "AMYLASE" in the last 168 hours. No results for input(s): "AMMONIA" in the last 168 hours. Diabetic: Recent Labs    07/02/22 0940  HGBA1C 5.2   Recent Labs  Lab 07/03/22 1933 07/03/22 2319 07/04/22 0325 07/04/22 0800 07/04/22 1129  GLUCAP 149* 104*  116* 80 127*   Cardiac Enzymes: No results for input(s): "CKTOTAL", "CKMB", "CKMBINDEX", "TROPONINI" in the last 168 hours. No results for input(s): "PROBNP" in the last 8760 hours. Coagulation Profile: Recent Labs  Lab 07/01/22 2349 07/02/22 0940 07/03/22 0347 07/03/22 0941  INR 3.4* 3.4* 1.8* 1.6*   Thyroid Function Tests: No results for input(s): "TSH", "T4TOTAL", "FREET4", "T3FREE", "THYROIDAB" in the last 72 hours. Lipid Profile: No results for input(s): "CHOL", "HDL", "LDLCALC", "TRIG", "CHOLHDL", "LDLDIRECT" in the last 72 hours. Anemia Panel: Recent Labs    07/04/22 0728 07/04/22 0948  VITAMINB12 1,390*  --   FOLATE 12.0  --   FERRITIN 545*  --   TIBC 165*  --   IRON 18*  --   RETICCTPCT  --  2.8   Urine analysis:    Component Value Date/Time   COLORURINE STRAW (A) 07/02/2022 0052   APPEARANCEUR CLEAR 07/02/2022 0052   LABSPEC 1.005 07/02/2022 0052   PHURINE 7.0 07/02/2022 0052   GLUCOSEU NEGATIVE 07/02/2022 0052   HGBUR NEGATIVE 07/02/2022 0052   BILIRUBINUR NEGATIVE 07/02/2022 0052   KETONESUR NEGATIVE 07/02/2022 0052   PROTEINUR NEGATIVE 07/02/2022 0052   NITRITE NEGATIVE 07/02/2022 0052   LEUKOCYTESUR NEGATIVE 07/02/2022 0052   Sepsis Labs: Invalid input(s): "PROCALCITONIN", "LACTICIDVEN"  Microbiology: Recent Results (from the past 240 hour(s))  MRSA Next Gen by PCR, Nasal     Status: None   Collection Time: 07/02/22  9:05 AM   Specimen: Nasal Mucosa; Nasal Swab  Result Value Ref Range Status   MRSA by PCR Next Gen NOT DETECTED NOT DETECTED Final    Comment: (NOTE) The GeneXpert MRSA Assay (FDA approved for NASAL specimens only), is one component of a comprehensive MRSA colonization surveillance program. It is not intended to diagnose MRSA infection nor to guide or monitor treatment for MRSA infections. Test performance is not FDA approved in patients less than 46 years old. Performed at [facility], [address], [place],  [place] [address]     Radiology Studies: No results found.    Lindsey Huber  [facility]  If 7PM-7AM, please contact night-coverage [email or web address] 07/04/2022, 12:06 PM

## 2022-07-04 NOTE — Progress Notes (Signed)
[facility] Gastroenterology Progress Note  Lindsey Huber 77 y.o. 1945/12/23   Subjective: No rectal bleeding overnight per nursing. Denies abdominal pain. Tolerating clear liquids.  Objective: Vital signs: Vitals:   07/04/22 1100 07/04/22 1131  BP: (!) 96/51   Pulse: 69   Resp: (!) 33   Temp:  98.4 F (36.9 C)  SpO2: 100%     Physical Exam: Gen: lethargic, elderly, thin, no acute distress  HEENT: anicteric sclera CV: RRR Chest: CTA B Abd: soft, nontender, nondistended, +BS Ext: no edema  Lab Results: Recent Labs    07/02/22 0940 07/03/22 0938 07/04/22 0128  NA 137 140 136  K 3.7 4.1 3.5  CL 106 106 102  CO2 22 23 23   GLUCOSE 113* 114* 109*  BUN 24* 25* 21  CREATININE 1.11* 1.44* 1.40*  CALCIUM 8.5* 7.9* 8.4*  MG 1.8  --   --   PHOS 5.2*  --   --    Recent Labs    07/01/22 2349  AST 36  ALT 25  ALKPHOS 73  BILITOT 0.6  PROT 7.9  ALBUMIN 3.4*   Recent Labs    07/01/22 2349 07/02/22 0323 07/02/22 0940 07/02/22 1532 07/03/22 0941 07/03/22 1446 07/04/22 0128 07/04/22 0948  WBC 7.6  --  10.2  --   --   --  11.8*  --   NEUTROABS 5.8  --   --   --   --   --   --   --   HGB 10.5*   < > 9.6*   < >  --    < > 7.8* 7.7*  HCT 32.6*   < > 28.6*   < >  --    < > 23.0* 23.3*  MCV 100.6*  --  86.1  --   --   --  84.9  --   PLT 143*  --  81*  --  75*  --  81*  --    < > = values in this interval not displayed.      Assessment/Plan: Lower GI bleed likely diverticular (positive CTA for active bleeding in descending/sigmoid colon junction on 07/02/22) that seems to have resolved. Bleeding subsided after correction of INR therefore, embolization not attempted. Would continue to hold Xarelto for at least 3 more days if ok with cardiology. Advance diet per primary team. [facility] will sign off. Call us if questions.   [name] 07/04/2022, 2:18 PM  Questions please call [phone] ID: Lindsey Huber, female   DOB: 13-May-1945, 77 y.o.   MRN:  [id number]

## 2022-07-05 DIAGNOSIS — K5791 Diverticulosis of intestine, part unspecified, without perforation or abscess with bleeding: Secondary | ICD-10-CM | POA: Diagnosis not present

## 2022-07-05 DIAGNOSIS — N183 Chronic kidney disease, stage 3 unspecified: Secondary | ICD-10-CM | POA: Diagnosis not present

## 2022-07-05 DIAGNOSIS — D6869 Other thrombophilia: Secondary | ICD-10-CM | POA: Diagnosis not present

## 2022-07-05 DIAGNOSIS — I442 Atrioventricular block, complete: Secondary | ICD-10-CM | POA: Diagnosis not present

## 2022-07-05 LAB — COMPREHENSIVE METABOLIC PANEL
ALT: 43 U/L (ref 0–44)
AST: 56 U/L — ABNORMAL HIGH (ref 15–41)
Albumin: 2.1 g/dL — ABNORMAL LOW (ref 3.5–5.0)
Alkaline Phosphatase: 105 U/L (ref 38–126)
Anion gap: 9 (ref 5–15)
BUN: 18 mg/dL (ref 8–23)
CO2: 21 mmol/L — ABNORMAL LOW (ref 22–32)
Calcium: 8 mg/dL — ABNORMAL LOW (ref 8.9–10.3)
Chloride: 103 mmol/L (ref 98–111)
Creatinine, Ser: 1.25 mg/dL — ABNORMAL HIGH (ref 0.44–1.00)
GFR, Estimated: 45 mL/min — ABNORMAL LOW (ref >60)
Glucose, Bld: 106 mg/dL — ABNORMAL HIGH (ref 70–99)
Potassium: 4 mmol/L (ref 3.5–5.1)
Sodium: 133 mmol/L — ABNORMAL LOW (ref 135–145)
Total Bilirubin: 2.1 mg/dL — ABNORMAL HIGH (ref 0.3–1.2)
Total Protein: 5 g/dL — ABNORMAL LOW (ref 6.5–8.1)

## 2022-07-05 LAB — PREPARE RBC (CROSSMATCH)

## 2022-07-05 LAB — CBC
HCT: 20.8 % — ABNORMAL LOW (ref 36.0–46.0)
HCT: 33 % — ABNORMAL LOW (ref 36.0–46.0)
HCT: 30.3 % — ABNORMAL LOW (ref 36.0–46.0)
Hemoglobin: 6.9 g/dL — CL (ref 12.0–15.0)
Hemoglobin: 11.5 g/dL — ABNORMAL LOW (ref 12.0–15.0)
Hemoglobin: 9.9 g/dL — ABNORMAL LOW (ref 12.0–15.0)
MCH: 29.5 pg (ref 26.0–34.0)
MCH: 30.8 pg (ref 26.0–34.0)
MCH: 30.4 pg (ref 26.0–34.0)
MCHC: 33.2 g/dL (ref 30.0–36.0)
MCHC: 34.8 g/dL (ref 30.0–36.0)
MCHC: 32.7 g/dL (ref 30.0–36.0)
MCV: 88.9 fL (ref 80.0–100.0)
MCV: 88.5 fL (ref 80.0–100.0)
MCV: 92.9 fL (ref 80.0–100.0)
Platelets: 89 10*3/uL — ABNORMAL LOW (ref 150–400)
Platelets: 77 10*3/uL — ABNORMAL LOW (ref 150–400)
Platelets: 97 10*3/uL — ABNORMAL LOW (ref 150–400)
RBC: 2.34 MIL/uL — ABNORMAL LOW (ref 3.87–5.11)
RBC: 3.73 MIL/uL — ABNORMAL LOW (ref 3.87–5.11)
RBC: 3.26 MIL/uL — ABNORMAL LOW (ref 3.87–5.11)
RDW: 21.6 % — ABNORMAL HIGH (ref 11.5–15.5)
RDW: 19.9 % — ABNORMAL HIGH (ref 11.5–15.5)
RDW: 20.2 % — ABNORMAL HIGH (ref 11.5–15.5)
WBC: 7.8 10*3/uL (ref 4.0–10.5)
WBC: 9.2 10*3/uL (ref 4.0–10.5)
WBC: 8.1 10*3/uL (ref 4.0–10.5)
nRBC: 1 % — ABNORMAL HIGH (ref 0.0–0.2)
nRBC: 0.7 % — ABNORMAL HIGH (ref 0.0–0.2)
nRBC: 0.9 % — ABNORMAL HIGH (ref 0.0–0.2)

## 2022-07-05 LAB — MAGNESIUM
Magnesium: 1.9 mg/dL (ref 1.7–2.4)
Magnesium: 2 mg/dL (ref 1.7–2.4)

## 2022-07-05 LAB — HEMOGLOBIN AND HEMATOCRIT, BLOOD
HCT: 27.3 % — ABNORMAL LOW (ref 36.0–46.0)
Hemoglobin: 9 g/dL — ABNORMAL LOW (ref 12.0–15.0)

## 2022-07-05 LAB — PHOSPHORUS: Phosphorus: 2.8 mg/dL (ref 2.5–4.6)

## 2022-07-05 LAB — TYPE AND SCREEN

## 2022-07-05 LAB — CK: Total CK: 63 U/L (ref 38–234)

## 2022-07-05 MED ORDER — SODIUM CHLORIDE 0.9% IV SOLUTION: Freq: Once | INTRAVENOUS | Status: AC

## 2022-07-05 NOTE — Evaluation (Signed)
Occupational Therapy Evaluation Patient Details Name: Lindsey Huber MRN: [id number] DOB: [birth year] Today's Date: 07/05/2022   History of Present Illness Pt is a 77 y.o. 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  PTA she lives with her daughter, who is able to provide the needed assist.  Currently she is limited by LLE NWB, but is supposed to become WBAT in [employer] as soon as ortho updates the order.  OT goals are set with the premise that she will become WBAT with the boot, if not, the patient would essentially be at her baseline at wheelchair level.  OT to continue if WBS is upgraded, no HH OT is anticipated given daughter's ability to assist.       Recommendations for follow up therapy are one component of a multi-disciplinary discharge planning process, led by the attending physician.  Recommendations may be updated based on patient status, additional functional criteria and insurance authorization.   Assistance Recommended at Discharge Frequent or constant Supervision/Assistance  Patient can return home with the following Help with stairs or ramp for entrance;A little help with bathing/dressing/bathroom;A little help with walking and/or transfers;Assist for transportation;Assistance with cooking/housework    Functional Status Assessment  Patient has had a recent decline in their functional status and demonstrates the ability to make significant improvements in function in a reasonable and predictable amount of time.  Equipment Recommendations  None recommended by OT    Recommendations for Other Services       Precautions / Restrictions Precautions Precautions: Fall Required Braces or Orthoses: Other Brace Other  Brace: cam boot (not present in room). Per Dr. August Saucer outpatient progress note 6/6, ok to remove boot in bed.  Patient to ask family to bring the Cam Boot in Restrictions Weight Bearing Restrictions: Yes LLE Weight Bearing: Non weight bearing Other Position/Activity Restrictions: Per Dr. August Saucer outpatient progress note from 6/6, pt was to begin WBAT in cam boot 'the following weekend' and complete ankle pumps 30 x 3 daily. PT to continue NWB LLE until cleared by ortho for updated WB status.      Mobility Bed Mobility               General bed mobility comments: up in the recliner    Transfers Overall transfer level: Needs assistance   Transfers: Bed to chair/wheelchair/BSC     Squat pivot transfers: Min assist              Balance Overall balance assessment: Needs assistance Sitting-balance support: Feet supported, No upper extremity supported Sitting balance-Leahy Scale: Good     Standing balance support: Reliant on assistive device for balance Standing balance-Leahy Scale: Poor                             ADL either performed or assessed with clinical judgement   ADL       Grooming: Set up;Sitting               Lower Body Dressing: Minimal assistance;Moderate assistance;Sit to/from stand   Toilet Transfer: Minimal assistance;BSC/3in1;Rolling walker (2 wheels)                   Vision Patient Visual Report: No change from baseline  Perception     Praxis      Pertinent Vitals/Pain Pain Assessment Pain Assessment: No/denies pain     Hand Dominance Right   Extremity/Trunk Assessment Upper Extremity Assessment Upper Extremity Assessment: Overall WFL for tasks assessed   Lower Extremity Assessment Lower Extremity Assessment: Defer to PT evaluation LLE Deficits / Details: edema noted at ankle/foot. Pt was in a hard cast following ankle fx until 6/6. She transitioned to cam boot at that time.   Cervical / Trunk  Assessment Cervical / Trunk Assessment: Kyphotic   Communication Communication Communication: No difficulties   Cognition Arousal/Alertness: Awake/alert Behavior During Therapy: WFL for tasks assessed/performed Overall Cognitive Status: Within Functional Limits for tasks assessed                                       General Comments  VSS on RA    Exercises     Shoulder Instructions      Home Living Family/patient expects to be discharged to:: Private residence Living Arrangements: Children Available Help at Discharge: Family;Available 24 hours/day Type of Home: House Home Access: Level entry     Home Layout: One level     Bathroom Shower/Tub: Chief [employer] Officer: Standard Bathroom Accessibility: Yes How Accessible: Accessible via walker;Accessible via wheelchair Home Equipment: Shower Counsellor (2 wheels);Cane - quad;Wheelchair - manual;Tub bench;Adaptive equipment Adaptive Equipment: Reacher;Long-handled sponge        Prior Functioning/Environment Prior Level of Function : Needs assist             Mobility Comments: Pt has been mod I at w/c level since sustaining L ankle fx approx 3 weeks ago. Amb with RW prior to that time. ADLs Comments: Daughter has been assisting with ADLs as needed since recent L ankle fx.  Patient will transfer to tub bench, and completes 75% of her ADL seated.        OT Problem List: Decreased activity tolerance;Impaired balance (sitting and/or standing);Increased edema;Decreased strength      OT Treatment/Interventions: Self-care/ADL training;Therapeutic activities;Patient/family education;Balance training;DME and/or AE instruction    OT Goals(Current goals can be found in the care plan section) Acute Rehab OT Goals Patient Stated Goal: Retur home OT Goal Formulation: With patient Time For Goal Achievement: 07/19/22 Potential to Achieve Goals: Good ADL Goals Pt Will Perform Grooming:  with modified independence;standing Pt Will Perform Lower Body Dressing: with modified independence;sit to/from stand Pt Will Transfer to Toilet: with modified independence;ambulating;regular height toilet  OT Frequency: Min 2X/week    Co-evaluation              AM-PAC OT "6 Clicks" Daily Activity     Outcome Measure Help from another person eating meals?: None Help from another person taking care of personal grooming?: None Help from another person toileting, which includes using toliet, bedpan, or urinal?: A Lot Help from another person bathing (including washing, rinsing, drying)?: A Lot Help from another person to put on and taking off regular upper body clothing?: None Help from another person to put on and taking off regular lower body clothing?: A Lot 6 Click Score: 18   End of Session Equipment Utilized During Treatment: Rolling walker (2 wheels) Nurse Communication: Mobility status  Activity Tolerance: Patient tolerated treatment well Patient left: in chair;with call bell/phone within reach  OT Visit Diagnosis: Unsteadiness on feet (R26.81)  Time: 1330-1350 OT Time Calculation (min): 20 min Charges:  OT General Charges $OT Visit: 1 Visit OT Evaluation $OT Eval Moderate Complexity: 1 Mod  07/05/2022  RP, OTR/L  Acute Rehabilitation Services  Office:  [phone]   Suzanna Obey 07/05/2022, 1:59 PM

## 2022-07-05 NOTE — Progress Notes (Signed)
PROGRESS NOTE  Lindsey Huber WUJ:[id number] DOB: 03-22-1945   PCP: Collene Mares, PA  Patient is from: Home.  Lives with daughter.  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  Hgb 10.5 and dropped to 7.0 the next morning.  She became hypotensive.  Transfused 3 units, received Kcentra, vitamin K and started on basal pressors and admitted to ICU.  CT angio showed extravasation at the junction of descending and sigmoid colon and duodenal enteritis.  GI and IR consulted.  Patient was stabilized and came off vasopressor, and transferred to [facility] hospitalist service on 6/9.  GI signed off.  Hemoglobin trended down to 6.9 without overt bleeding.  Transfused additional 1 unit.    Subjective: Seen and examined earlier this morning.  No major events overnight of this morning.  Feels better this morning.  Denies chest pain, shortness of breath, dizziness or palpitation.  No bowel movement or signs of bleeding.  Hemoglobin dropped down to 6.9.  Transfused 1 unit earlier this morning.  Posttransfusion H&H pending.    Objective: Vitals:   07/05/22 0500 07/05/22 0600 07/05/22 0700 07/05/22 0800  BP: 110/60 93/63 99/64  (!) 115/94  Pulse: 73 69 70 69  Resp: 14 18 20 19   Temp:  97.9 F (36.6 C)    TempSrc:  Oral    SpO2: 100% 100% 100% 100%  Weight: 53.4 kg     Height:        Examination:  GENERAL: No apparent distress.  Appears frail. HEENT: MMM.  Vision and  hearing grossly intact.  Conjunctival palor NECK: Supple.  No apparent JVD.  RESP:  No IWOB.  Fair aeration bilaterally. CVS:  RRR.  Mechanical heart sound. ABD/GI/GU: BS+. Abd soft, NTND.  MSK/EXT:  Moves extremities.  Left foot edema/swelling SKIN: no apparent skin lesion or wound NEURO: Awake, alert and oriented appropriately.  No apparent focal neuro deficit. PSYCH: Calm. Normal affect.   Procedures:  None  Microbiology summarized: MRSA PCR screen nonreactive  Assessment and plan: Principal Problem:   GIB (gastrointestinal bleeding) Active Problems:   Essential (primary) hypertension   Hypercoagulability due to atrial fibrillation   Chronic heart failure with preserved ejection fraction   Chronic kidney disease (CKD), active medical management without dialysis, stage 3 (moderate)   Secondary adrenal insufficiency   Hypotension   CHB (complete heart block)   Hemorrhagic shock (HCC)   Acute blood loss anemia  Hemorrhagic shock due to ABLA in the setting of acute LGIB on Xarelto (POA): Required vasopressor and ICU stay.   Received Kcentra and vitamin K. Transfused 3 units with improvement in Hgb but Hgb dropped down to 6.9 again without overt bleeding.  Transfused additional 1 unit on 6/10.  Blood pressure improved.  Anemia panel without iron deficiency.  No history of CAD. Recent Labs    07/02/22 0940 07/02/22 1532 07/02/22 2107 07/03/22 0347 07/03/22 9562 07/03/22 1446 07/04/22 0128 07/04/22 0948 07/04/22 1954 07/05/22 0232  HGB 9.6* 9.5* 8.9* 7.3* 8.7* 8.2* 7.8* 7.7* 7.0* 6.9*  -  Check posttransfusion CBC monitor H&H -Continue PPI twice daily -GI signed off -May advance diet to soft after posttransfusion CBC. -PT/OT   Atrial Fibrillation/SSS s/p PPM: EKG with ventricular paced rhythm. -Xarelto discontinued due to GI bleed -Hold metoprolol due to soft BP. -Optimize electrolytes  Chronic HFpEF: Appears euvolemic except for left foot edema.  No cardiopulmonary  symptoms. -Continue holding diuretics. -Strict intake and output  Valvular disease w/prior bioprosthetic AVR/MV ring/bioprosthetic TVR 09/25/2010    COPD/ILD/pulmonary fibrosis: Follows with [name], no home O2. No current evidence of exacerbation -Resumed home prednisone at 10 mg daily on 6/9.  Takes 5 mg daily at home -Continue home Plaquenil and Ofev -Continue inhalers  CKD stage 3A: Creatinine improved. Recent Labs    12/26/21 1350 12/27/21 0109 12/28/21 0052 12/29/21 0459 05/03/22 1519 07/01/22 2349 07/02/22 0940 07/03/22 0938 07/04/22 0128 07/05/22 0232  BUN 39* 35* 33* 31* 28* 27* 24* 25* 21 18  CREATININE 1.22* 1.14* 1.46* 1.04* 1.11* 1.16* 1.11* 1.44* 1.40* 1.25*  -Continue monitoring  Hypotension: Resolved. -Continue prednisone at 10 mg daily.  Takes 5 mg daily at home -Continue holding cardiac meds  Lupus -Resume prednisone and plaquenil   Generalized weakness/debility Recent left ankle fracture: Per daughter, NWB.  Uses wheelchair since ankle fracture. -PT/OT consult  Thrombocytopenia: Improving Recent Labs  Lab 07/01/22 2349 07/02/22 0940 07/03/22 0941 07/04/22 0128 07/05/22 0232  PLT 143* 81* 75* 81* 89*  -Continue monitoring   Body mass index is 20.85 kg/m.           DVT prophylaxis:  SCDs Start: 07/02/22 0455  Code Status: Full code Family Communication: Updated patient's daughter at bedside on 6/9.  None at bedside today Level of care: Progressive Status is: Inpatient Remains inpatient appropriate because: Acute blood loss anemia   Final disposition: TBD Consultants:  Pulmonology admitted patient Gastroenterology IR  55 minutes with more than 50% spent in reviewing records, counseling patient/family and coordinating care.   Sch Meds:  Scheduled Meds:  allopurinol  100 mg Oral Daily   Chlorhexidine Gluconate Cloth  6 each Topical Daily   hydroxychloroquine  200 mg Oral Daily   levothyroxine  25 mcg Oral Q0600    mometasone-formoterol  2 puff Inhalation BID   OLANZapine  2.5 mg Oral QHS   pantoprazole (PROTONIX) IV  40 mg Intravenous Q12H   predniSONE  10 mg Oral Q breakfast   umeclidinium bromide  1 puff Inhalation Daily   Continuous Infusions:   PRN Meds:.docusate sodium, mouth rinse, polyethylene glycol  Antimicrobials: Anti-infectives (From admission, onward)    Start     Dose/Rate Route Frequency Ordered Stop   07/04/22 1000  hydroxychloroquine (PLAQUENIL) tablet 200 mg        200 mg Oral Daily 07/04/22 0717          I have personally reviewed the following labs and images: CBC: Recent Labs  Lab 07/01/22 2349 07/02/22 0323 07/02/22 0940 07/02/22 1532 07/03/22 0941 07/03/22 1446 07/04/22 0128 07/04/22 0948 07/04/22 1954 07/05/22 0232  WBC 7.6  --  10.2  --   --   --  11.8*  --   --  7.8  NEUTROABS 5.8  --   --   --   --   --   --   --   --   --   HGB 10.5*   < > 9.6*   < >  --  8.2* 7.8* 7.7* 7.0* 6.9*  HCT 32.6*   < > 28.6*   < >  --  24.6* 23.0* 23.3* 21.8* 20.8*  MCV 100.6*  --  86.1  --   --   --  84.9  --   --  88.9  PLT 143*  --  81*  --  75*  --  81*  --   --  89*   < > = values in this interval not displayed.   BMP &GFR Recent Labs  Lab 07/01/22 2349 07/02/22 0940 07/03/22 0938 07/04/22 0128 07/05/22 0232  NA 134* 137 140 136 133*  K 4.4 3.7 4.1 3.5 4.0  CL 100 106 106 102 103  CO2 24 22 23 23  21*  GLUCOSE 138* 113* 114* 109* 106*  BUN 27* 24* 25* 21 18  CREATININE 1.16* 1.11* 1.44* 1.40* 1.25*  CALCIUM 9.1 8.5* 7.9* 8.4* 8.0*  MG  --  1.8  --   --  1.9  PHOS  --  5.2*  --   --  2.8   Estimated Creatinine Clearance: 32.2 mL/min (A) (by C-G formula based on SCr of 1.25 mg/dL (H)). Liver & Pancreas: Recent Labs  Lab 07/01/22 2349 07/05/22 0232  AST 36 56*  ALT 25 43  ALKPHOS 73 105  BILITOT 0.6 2.1*  PROT 7.9 5.0*  ALBUMIN 3.4* 2.1*   No results for input(s): "LIPASE", "AMYLASE" in the last 168 hours. No results for input(s): "AMMONIA" in  the last 168 hours. Diabetic: No results for input(s): "HGBA1C" in the last 72 hours.  Recent Labs  Lab 07/03/22 2319 07/04/22 0325 07/04/22 0800 07/04/22 1129 07/04/22 1636  GLUCAP 104* 116* 80 127* 147*   Cardiac Enzymes: Recent Labs  Lab 07/05/22 0232  CKTOTAL 63   No results for input(s): "PROBNP" in the last 8760 hours. Coagulation Profile: Recent Labs  Lab 07/01/22 2349 07/02/22 0940 07/03/22 0347 07/03/22 0941  INR 3.4* 3.4* 1.8* 1.6*   Thyroid Function Tests: No results for input(s): "TSH", "T4TOTAL", "FREET4", "T3FREE", "THYROIDAB" in the last 72 hours. Lipid Profile: No results for input(s): "CHOL", "HDL", "LDLCALC", "TRIG", "CHOLHDL", "LDLDIRECT" in the last 72 hours. Anemia Panel: Recent Labs    07/04/22 0728 07/04/22 0948  VITAMINB12 1,390*  --   FOLATE 12.0  --   FERRITIN 545*  --   TIBC 165*  --   IRON 18*  --   RETICCTPCT  --  2.8   Urine analysis:    Component Value Date/Time   COLORURINE STRAW (A) 07/02/2022 0052   APPEARANCEUR CLEAR 07/02/2022 0052   LABSPEC 1.005 07/02/2022 0052   PHURINE 7.0 07/02/2022 0052   GLUCOSEU NEGATIVE 07/02/2022 0052   HGBUR NEGATIVE 07/02/2022 0052   BILIRUBINUR NEGATIVE 07/02/2022 0052   KETONESUR NEGATIVE 07/02/2022 0052   PROTEINUR NEGATIVE 07/02/2022 0052   NITRITE NEGATIVE 07/02/2022 0052   LEUKOCYTESUR NEGATIVE 07/02/2022 0052   Sepsis Labs: Invalid input(s): "PROCALCITONIN", "LACTICIDVEN"  Microbiology: Recent Results (from the past 240 hour(s))  MRSA Next Gen by PCR, Nasal     Status: None   Collection Time: 07/02/22  9:05 AM   Specimen: Nasal Mucosa; Nasal Swab  Result Value Ref Range Status   MRSA by PCR Next Gen NOT DETECTED NOT DETECTED Final    Comment: (NOTE) The GeneXpert MRSA Assay (FDA approved for NASAL specimens only), is one component of a comprehensive MRSA colonization surveillance program. It is not intended to diagnose MRSA infection nor to guide or monitor treatment for  MRSA infections. Test performance is not FDA approved in patients less than 23 years old. Performed at [facility], [address], [place], [place] [address]     Radiology Studies: No results found.    Jarl Sellitto T. Aaima Gaddie [facility]  If 7PM-7AM, please contact night-coverage [email or web address] 07/05/2022, 11:11 AM

## 2022-07-05 NOTE — Progress Notes (Signed)
Orthopedic Tech Progress Note Patient Details:  [name] 1945/11/05 [id number]  Called floor and RN asked patient about WALKING BOOT, and the family is bringing it up tomorrow   Patient ID: Lindsey Huber, female   DOB: February 13, 1945, 77 y.o.   MRN: [id number]  Donald Pore 07/05/2022, 4:34 PM

## 2022-07-05 NOTE — Evaluation (Signed)
Physical Therapy Evaluation Patient Details Name: Lindsey Huber MRN: [id number] DOB: 1945/01/30 Today's Date: 07/05/2022  History of Present Illness  Pt is a 77 y.o. 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 PTA pt lived at home with her daughter, mod I mobility at w/c level following recent ankle fx. Prior to ankle fx, pt mod I amb with RW.  Pt currently with functional limitations due to the deficits listed below (see PT Problem List). On eval, pt required min guard assist bed mobility, and min assist squat pivot transfer bed to recliner. Pt will benefit from acute skilled PT to increase their independence and safety with mobility to allow discharge.  PT to follow acutely. Per ortho outpatient progress note, pt was scheduled for increased WB status over weekend to [facility] in cam boot. Will maintain NWB LLE until cleared by ortho. If WB status does change, pt would benefit from HHPT upon d/c.         Recommendations for follow up therapy are one component of a multi-disciplinary discharge planning process, led by the attending physician.  Recommendations may be updated based on patient status, additional functional criteria and insurance authorization.  Follow Up Recommendations       Assistance Recommended at Discharge Frequent or constant Supervision/Assistance  Patient can return home with the following  A little help with walking and/or transfers;A little help with bathing/dressing/bathroom;Assistance with cooking/housework;Assist for transportation;Help with stairs or ramp for entrance    Equipment Recommendations None recommended by PT  Recommendations for Other Services       Functional Status Assessment Patient has had a recent  decline in their functional status and demonstrates the ability to make significant improvements in function in a reasonable and predictable amount of time.     Precautions / Restrictions Precautions Precautions: Fall Required Braces or Orthoses: Other Brace Other Brace: cam boot (not present in room). Per Dr. August Saucer outpatient progress note 6/6, ok to remove boot in bed. Restrictions LLE Weight Bearing: Non weight bearing Other Position/Activity Restrictions: Per Dr. August Saucer outpatient progress note from 6/6, pt was to begin WBAT in cam boot 'the following weekend' and complete ankle pumps 30 x 3 daily. PT to continue NWB LLE until cleared by ortho for updated WB status.      Mobility  Bed Mobility Overal bed mobility: Needs Assistance Bed Mobility: Supine to Sit     Supine to sit: Min guard, HOB elevated     General bed mobility comments: +rail, increased time    Transfers Overall transfer level: Needs assistance   Transfers: Bed to chair/wheelchair/BSC       Squat pivot transfers: Min assist     General transfer comment: pivot transfer bed to recliner toward R    Ambulation/Gait               General Gait Details: deferred due to NWB LLE  Stairs            Wheelchair Mobility    Modified Rankin (Stroke Patients Only)       Balance Overall balance assessment: Needs assistance Sitting-balance support: Feet supported, No upper extremity supported Sitting balance-Leahy Scale: Good  Pertinent Vitals/Pain Pain Assessment Pain Assessment: No/denies pain    Home Living Family/patient expects to be discharged to:: Private residence Living Arrangements: Children (daughter) Available Help at Discharge: Family;Available 24 hours/day Type of Home: House Home Access: Level entry       Home Layout: One level Home Equipment: Pharmacist, hospital (2 wheels);Cane - quad;Wheelchair - manual       Prior Function Prior Level of Function : Needs assist             Mobility Comments: Pt has been mod I at w/c level since sustaining L ankle fx approx 3 weeks ago. Amb with RW prior to that time. ADLs Comments: Daughter has been assisting with ADLs as needed since recent L ankle fx.     Hand Dominance        Extremity/Trunk Assessment   Upper Extremity Assessment Upper Extremity Assessment: Defer to OT evaluation    Lower Extremity Assessment Lower Extremity Assessment: LLE deficits/detail LLE Deficits / Details: edema noted at ankle/foot. Pt was in a hard cast following ankle fx until 6/6. She transitioned to cam boot at that time.    Cervical / Trunk Assessment Cervical / Trunk Assessment: Kyphotic  Communication   Communication: No difficulties  Cognition Arousal/Alertness: Awake/alert Behavior During Therapy: WFL for tasks assessed/performed Overall Cognitive Status: Within Functional Limits for tasks assessed                                          General Comments General comments (skin integrity, edema, etc.): VSS on RA    Exercises General Exercises - Lower Extremity Ankle Circles/Pumps: AROM, Left, 10 reps   Assessment/Plan    PT Assessment Patient needs continued PT services  PT Problem List Decreased balance;Decreased knowledge of precautions;Decreased mobility;Decreased activity tolerance       PT Treatment Interventions DME instruction;Functional mobility training;Balance training;Patient/family education;Gait training;Therapeutic activities;Therapeutic exercise    PT Goals (Current goals can be found in the Care Plan section)  Acute Rehab PT Goals Patient Stated Goal: home PT Goal Formulation: With patient Time For Goal Achievement: 07/19/22 Potential to Achieve Goals: Good    Frequency Min 3X/week     Co-evaluation               AM-PAC PT "6 Clicks" Mobility  Outcome Measure Help needed turning from your back  to your side while in a flat bed without using bedrails?: A Little Help needed moving from lying on your back to sitting on the side of a flat bed without using bedrails?: A Little Help needed moving to and from a bed to a chair (including a wheelchair)?: A Little Help needed standing up from a chair using your arms (e.g., wheelchair or bedside chair)?: A Little Help needed to walk in hospital room?: Total Help needed climbing 3-5 steps with a railing? : Total 6 Click Score: 14    End of Session Equipment Utilized During Treatment: Gait belt Activity Tolerance: Patient tolerated treatment well Patient left: in chair;with call bell/phone within reach Nurse Communication: Mobility status PT Visit Diagnosis: Other abnormalities of gait and mobility (R26.89)    Time: 9604-5409 PT Time Calculation (min) (ACUTE ONLY): 20 min   Charges:   PT Evaluation $PT Eval Moderate Complexity: 1 Mod          Ferd Glassing., PT  Office # [phone]   Ilda Foil 07/05/2022,  12:18 PM

## 2022-07-05 NOTE — Progress Notes (Signed)
Patient received 1 pRBC for hgb 6.9 per Dr. Loney Loh this morning at 0405. Refer to charting. Orders placed for additional RBC this morning by Dr. Alanda Slim. Paged MD for clarification on orders. Awaiting return page.

## 2022-07-05 NOTE — Plan of Care (Signed)

## 2022-07-05 NOTE — TOC Initial Note (Signed)
Transition of Care [facility]) - Initial/Assessment Note    Patient Details  Name: Lindsey Huber MRN: [id number] Date of Birth: 18-Apr-1945  Transition of [facility]) CM/SW Contact:    Tom-Johnson, Hershal Coria, RN Phone Number: 07/05/2022, 12:50 PM  Clinical Narrative:                  CM spoke with patient at bedside and daughter, Lindsey Huber via phone per patient's request about needs for post hospital transition.  Admitted for GI Bleed. Hgb on admit was 10.4, dropped to 7.0. Patient noted to have an episode of bloody stool with clot in the ED. Hgb this morning was 6.9 1U PRBC given. Patient has received 4 U PRBC this admission. Patient also receive Vit K, Kcentra and Vasopressors.   From home with daughter, Lindsey Huber and grand daughter. Has two supportive children. Family transports to and from appointments.  Has a cane, walker, w/c, shower seat and grab Bars at home.  PCP is [place], [place], [name] and uses [facility] in [place].   PT recommending Home Health PT at discharge after Ortho advancing restrictions. Patient currently on NWB from a recent Lt Ankle fx. Patient and Lindsey Huber does not have preference, CM called in referral to [facility] and Amy Voiced acceptance, info on AVS.  CM will continue to follow as patient progresses with care towards discharge.        Expected Discharge Plan: Home w Home Health Services Barriers to Discharge: Continued Medical Work up   Patient Goals and CMS Choice Patient states their goals for this hospitalization and ongoing recovery are:: To return home CMS Medicare.gov Compare Post Acute Care list provided to:: Patient Choice offered to / list presented to : Patient      Expected Discharge Plan and Services   Discharge Planning Services: CM Consult Post Acute Care Choice: Home Health Living arrangements for the past 2 months: Apartment                 DME Arranged: N/A DME Agency: NA       HH Arranged: PT, OT, Nurse's Aide           Prior Living Arrangements/Services Living arrangements for the past 2 months: Apartment Lives with:: Adult Children (Daughter and grand daughter) Patient language and need for interpreter reviewed:: Yes Do you feel safe going back to the place where you live?: Yes      Need for Family Participation in Patient Care: Yes (Comment) Care giver support system in place?: Yes (comment) Current home services: DME (Cane, RW, W/c, shower seat, grab bars) Criminal Activity/Legal Involvement Pertinent to Current Situation/Hospitalization: No - Comment as needed  Activities of Daily Living      Permission Sought/Granted Permission sought to share information with : Case Manager, Magazine features editor, Family Supports Permission granted to share information with : Yes, Verbal Permission Granted              Emotional Assessment Appearance:: Appears stated age Attitude/Demeanor/Rapport: Engaged, Gracious Affect (typically observed): Accepting, Appropriate, Calm, Hopeful, Pleasant Orientation: : Oriented to Self, Oriented to Place, Oriented to  Time, Oriented to Situation Alcohol / Substance Use: Not Applicable Psych Involvement: No (comment)  Admission diagnosis:  GIB (gastrointestinal bleeding) [K92.2] Acute GI bleeding [K92.2] Symptomatic anemia [D64.9] Patient Active Problem List   Diagnosis Date Noted   Acute blood loss anemia 07/04/2022   GIB (gastrointestinal bleeding) 07/02/2022   Hemorrhagic shock (HCC) 07/02/2022   Symptomatic anemia 12/26/2021   Mild dementia  08/12/2021   Allergic rhinitis    Chronic gouty arthritis    Epistaxis    Hypoglycemia    Iron deficiency anemia    Long term (current) use of anticoagulants    Malnutrition of mild degree [name]: 75% to less than 90% of standard weight)    Oropharyngeal dysphagia    Osteopenia of neck of left femur    Raynaud's disease    Rheumatoid arthritis    Transient ischemic attack    Slow transit  constipation    Thrombophilia    Achalasia    Delusional thoughts    Recurrent falls 04/09/2021   Hypotension 12/14/2020   Hypokalemia 12/14/2020   Hypocalcemia 12/14/2020   Hypomagnesemia 12/14/2020   Non-toxic multinodular goiter 08/20/2020   Acquired hypothyroidism 08/20/2020   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   Vitamin D deficiency 05/01/2020   Proteinuria 05/01/2020   Osteoarthritis of knee 05/01/2020   Osteoarthritis of hip 05/01/2020   Gastroesophageal reflux disease 05/01/2020   Idiopathic pulmonary fibrosis 05/01/2020   Cardiomyopathy 05/01/2020   Hypercoagulability due to atrial fibrillation 05/01/2020   Systemic lupus erythematosus 05/01/2020   Chronic heart failure with preserved ejection fraction 05/01/2020   Chronic kidney disease (CKD), active medical management without dialysis, stage 3 (moderate) 05/01/2020   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   Non-rheumatic atrial fibrillation 05/01/2020   Atherosclerosis of abdominal aorta 05/01/2020   Diverticulosis of colon 05/01/2020   Cholelithiasis without obstruction 05/01/2020   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   AICD (automatic cardioverter/defibrillator) present 11/14/2019   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 11/14/2019   Mitral and aortic incompetence 11/14/2019   Hyperlipidemia 11/14/2019   Pain of left hip joint 12/12/2017   Essential (primary) hypertension 08/18/2017   CHB (complete heart block) 08/18/2017   NSVT (nonsustained ventricular tachycardia) 08/18/2017   Aortic valve disorder 03/26/2002   PCP:  Collene Mares, PA Pharmacy[facility]   [id number] [place], [place] - [address] [place] [address] Phone: [phone] Fax: [phone]     Social Determinants of Health (SDOH) Social History: SDOH Screenings   Food Insecurity: No Food Insecurity (12/26/2021)  Housing: Low Risk   (12/26/2021)  Transportation Needs: No Transportation Needs (12/26/2021)  Utilities: Not At Risk (12/26/2021)  Tobacco Use: Low Risk  (07/02/2022)   SDOH Interventions: Transportation Interventions: Intervention Not Indicated, Inpatient TOC, Patient Resources (Friends/Family)   Readmission Risk Interventions    07/05/2022   12:36 PM 12/27/2021    3:07 PM  Readmission Risk Prevention Plan  Transportation Screening Complete Complete  PCP or Specialist Appt within 3-5 Days Complete Complete  HRI or Home Care Consult Complete Complete  Social Work Consult for Recovery Care Planning/Counseling Complete Complete  Palliative Care Screening Not Applicable Complete  Medication Review Oceanographer) Referral to Pharmacy Complete

## 2022-07-06 DIAGNOSIS — K5791 Diverticulosis of intestine, part unspecified, without perforation or abscess with bleeding: Secondary | ICD-10-CM | POA: Diagnosis not present

## 2022-07-06 LAB — RENAL FUNCTION PANEL
Albumin: 2.3 g/dL — ABNORMAL LOW (ref 3.5–5.0)
Anion gap: 6 (ref 5–15)
BUN: 15 mg/dL (ref 8–23)
CO2: 22 mmol/L (ref 22–32)
Calcium: 8.3 mg/dL — ABNORMAL LOW (ref 8.9–10.3)
Chloride: 105 mmol/L (ref 98–111)
Creatinine, Ser: 0.99 mg/dL (ref 0.44–1.00)
GFR, Estimated: 59 mL/min — ABNORMAL LOW (ref >60)
Glucose, Bld: 108 mg/dL — ABNORMAL HIGH (ref 70–99)
Phosphorus: 3.3 mg/dL (ref 2.5–4.6)
Potassium: 4.3 mmol/L (ref 3.5–5.1)
Sodium: 133 mmol/L — ABNORMAL LOW (ref 135–145)

## 2022-07-06 LAB — CBC
HCT: 25.6 % — ABNORMAL LOW (ref 36.0–46.0)
HCT: 25.8 % — ABNORMAL LOW (ref 36.0–46.0)
Hemoglobin: 8.4 g/dL — ABNORMAL LOW (ref 12.0–15.0)
Hemoglobin: 8.4 g/dL — ABNORMAL LOW (ref 12.0–15.0)
MCH: 29.6 pg (ref 26.0–34.0)
MCH: 29.4 pg (ref 26.0–34.0)
MCHC: 32.8 g/dL (ref 30.0–36.0)
MCHC: 32.6 g/dL (ref 30.0–36.0)
MCV: 90.1 fL (ref 80.0–100.0)
MCV: 90.2 fL (ref 80.0–100.0)
Platelets: 97 10*3/uL — ABNORMAL LOW (ref 150–400)
Platelets: 103 10*3/uL — ABNORMAL LOW (ref 150–400)
RBC: 2.84 MIL/uL — ABNORMAL LOW (ref 3.87–5.11)
RBC: 2.86 MIL/uL — ABNORMAL LOW (ref 3.87–5.11)
RDW: 20.2 % — ABNORMAL HIGH (ref 11.5–15.5)
RDW: 20.1 % — ABNORMAL HIGH (ref 11.5–15.5)
WBC: 7 10*3/uL (ref 4.0–10.5)
WBC: 8.1 10*3/uL (ref 4.0–10.5)
nRBC: 1.1 % — ABNORMAL HIGH (ref 0.0–0.2)
nRBC: 0.5 % — ABNORMAL HIGH (ref 0.0–0.2)

## 2022-07-06 LAB — BPAM RBC

## 2022-07-06 LAB — TYPE AND SCREEN

## 2022-07-06 MED ORDER — ACETAMINOPHEN 325 MG PO TABS
650.0000 mg | ORAL_TABLET | Freq: Four times a day (QID) | ORAL | Status: DC | PRN
  Administered 2022-07-06 – 2022-07-07 (×2): 650 mg via ORAL
  Filled 2022-07-06 (×2): qty 2

## 2022-07-06 MED ORDER — PANTOPRAZOLE SODIUM 40 MG PO TBEC
40.0000 mg | DELAYED_RELEASE_TABLET | Freq: Two times a day (BID) | ORAL | Status: DC
  Administered 2022-07-06 – 2022-07-10 (×8): 40 mg via ORAL
  Filled 2022-07-06 (×8): qty 1

## 2022-07-06 MED ORDER — METOPROLOL TARTRATE 12.5 MG HALF TABLET
12.5000 mg | ORAL_TABLET | Freq: Two times a day (BID) | ORAL | Status: DC
  Administered 2022-07-06 – 2022-07-10 (×7): 12.5 mg via ORAL
  Filled 2022-07-06 (×9): qty 1

## 2022-07-06 NOTE — Plan of Care (Signed)

## 2022-07-06 NOTE — Care Management Important Message (Signed)
Important Message  Patient Details  Name: Lindsey Huber MRN: [id number] Date of Birth: 12-04-1945   Medicare Important Message Given:  Yes     [name] Stefan Church 07/06/2022, 3:11 PM

## 2022-07-06 NOTE — Progress Notes (Signed)
PROGRESS NOTE                                                                                                                                                                                                             Patient Demographics:    Lindsey Huber, is a 77 y.o. 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  Hgb 10.5 and dropped to 7.0 the next morning.  She became hypotensive.  Transfused 4 units so far during this hospitalization, received Kcentra, vitamin K and started on basal pressors and admitted to ICU.  CT angio showed extravasation at the junction of descending and sigmoid colon and duodenal enteritis.  GI and IR consulted.  She was transferred out of ICU on 07/05/2022 and to my service on 07/06/2022, no further bleeding as far as patient can tell overnight.   Subjective:    [name] today has, No headache, No chest pain, No abdominal pain - No Nausea, No new weakness tingling or numbness, loss of breath, no blood in stool.   Assessment  & Plan :    Hemorrhagic shock due to acute blood loss caused from lower GI bleed most likely diverticular in the presence of Xarelto use. she has been seen by PCCM, [facility] along with IR, her Xarelto was reversed with Kcentra and vitamin K, she has received total of 4 units of PRBCs this admission, currently  on soft diet H&H is still fluctuating, continue to monitor another 24 hours, goal will be to keep hemoglobin above 8, patient agreeable for further transfusions if needed. GI has currently signed off, if bleeding reoccurs will reinvolve [facility] and IR.  History of paroxysmal atrial fibrillation, sick sinus syndrome s/p pacemaker placement.  [place] vas 2 score of greater  than 3.  Blood pressure still soft, beta-blocker on hold, Xarelto on hold.  Per GI Xarelto could be resumed 3 days after H&H is stable.  Chronic diastolic CHF.  Currently compensated.  History of valvular heart disease s/p bioprosthetic AVR, MV ring, bioprosthetic TVR.  Supportive care.  Beta-blocker anticoagulation resume once blood pressure and H&H stabilizes.  COPD, ILD.  Follows with [name].  Not on home oxygen, on chronic prednisone which will be continued, resumed home Plaquenil and resume Ofev upon discharge.  Supportive care with nebulizer treatments.  HX of lupus.  On prednisone, takes Plaquenil at home.  CKD stage IIIa.  Creatinine close to baseline monitor.  HX of fall.  Bilateral ankle injury and fractures.  Supposed to wear pro-form boots when she is out of bed, requested to use both foot pro-form boots whenever she is out of bed.  Thrombocytopenia.  Likely due to bleeding.  Improving.      Condition - Fair  Family Communication  : Daughter Marcelino Duster [phone]  on 07/06/2022 at 10:18 AM  Code Status :  Full  Consults  :  [facility], PCCM, IR  PUD Prophylaxis : PPI   Procedures  :           Disposition Plan  :    Status is: Inpatient  DVT Prophylaxis  :    SCDs Start: 07/02/22 0455   Lab Results  Component Value Date   PLT 97 (L) 07/06/2022    Diet :  Diet Order             DIET DYS 3 Room service appropriate? Yes; Fluid consistency: Thin  Diet effective now                    Inpatient Medications  Scheduled Meds:  allopurinol  100 mg Oral Daily   Chlorhexidine  Gluconate Cloth  6 each Topical Daily   hydroxychloroquine  200 mg Oral Daily   levothyroxine  25 mcg Oral Q0600   mometasone-formoterol  2 puff Inhalation BID   OLANZapine  2.5 mg Oral QHS   pantoprazole  40 mg Oral BID   predniSONE  10 mg Oral Q breakfast   umeclidinium bromide  1 puff Inhalation Daily   Continuous Infusions: PRN Meds:.docusate sodium, mouth rinse, polyethylene glycol  Antibiotics  :    Anti-infectives (From admission, onward)    Start     Dose/Rate Route Frequency Ordered Stop   07/04/22 1000  hydroxychloroquine (PLAQUENIL) tablet 200 mg        200 mg Oral Daily 07/04/22 0717           Objective:   Vitals:   07/05/22 2105 07/06/22 0000 07/06/22 0400 07/06/22 0800  BP: 105/84 116/67 110/68   Pulse: 70 71 69   Resp: 18 18 20    Temp: 98 F (36.7 C) 98.3 F (36.8 C) 98.2 F (36.8 C) 97.7 F (36.5 C)  TempSrc: Oral Oral Oral Oral  SpO2: 100% 100% 100%   Weight:      Height:        Wt Readings from Last 3 Encounters:  07/05/22 53.4 kg  06/25/22 55.8 kg  05/03/22 56.2 kg     Intake/Output Summary (Last 24 hours) at 07/06/2022 1009 Last data filed at 07/06/2022 0826 Gross per 24 hour  Intake 840 ml  Output 680 ml  Net 160 ml     Physical Exam  Awake Alert, No new F.N deficits, Normal affect [place].AT,PERRAL Supple Neck,  No JVD,   Symmetrical Chest wall movement, Good air movement bilaterally, CTAB RRR,No Gallops,Rubs or new Murmurs,  +ve B.Sounds, Abd Soft, No tenderness,   No Cyanosis, Clubbing or edema      Data Review:    Recent Labs  Lab 07/01/22 2349 07/02/22 0323 07/04/22 0128 07/04/22 0948 07/05/22 0232 07/05/22 1201 07/05/22 1840 07/05/22 2132 07/06/22 0403  WBC 7.6   < > 11.8*  --  7.8 9.2  --  8.1 7.0  HGB 10.5*   < > 7.8*   < > 6.9* 11.5* 9.0* 9.9* 8.4*  HCT 32.6*   < > 23.0*   < > 20.8* 33.0* 27.3* 30.3* 25.6*  PLT 143*   < > 81*  --  89* 77*  --  97* 97*  MCV 100.6*   < > 84.9  --  88.9 88.5  --  92.9 90.1  MCH  32.4   < > 28.8  --  29.5 30.8  --  30.4 29.6  MCHC 32.2   < > 33.9  --  33.2 34.8  --  32.7 32.8  RDW 13.6   < > 22.5*  --  21.6* 19.9*  --  20.2* 20.2*  LYMPHSABS 1.3  --   --   --   --   --   --   --   --   MONOABS 0.4  --   --   --   --   --   --   --   --   EOSABS 0.1  --   --   --   --   --   --   --   --   BASOSABS 0.0  --   --   --   --   --   --   --   --    < > = values in this interval not displayed.    Recent Labs  Lab 07/01/22 2349 07/02/22 0940 07/03/22 0347 07/03/22 1610 07/03/22 0941 07/04/22 0128 07/05/22 0232 07/05/22 2132 07/06/22 0403  NA 134* 137  --  140  --  136 133*  --  133*  K 4.4 3.7  --  4.1  --  3.5 4.0  --  4.3  CL 100 106  --  106  --  102 103  --  105  CO2 24 22  --  23  --  23 21*  --  22  ANIONGAP 10 9  --  11  --  11 9  --  6  GLUCOSE 138* 113*  --  114*  --  109* 106*  --  108*  BUN 27* 24*  --  25*  --  21 18  --  15  CREATININE 1.16* 1.11*  --  1.44*  --  1.40* 1.25*  --  0.99  AST 36  --   --   --   --   --  56*  --   --   ALT 25  --   --   --   --   --  43  --   --   ALKPHOS 73  --   --   --   --   --  105  --   --   BILITOT 0.6  --   --   --   --   --  2.1*  --   --   ALBUMIN 3.4*  --   --   --   --   --  2.1*  --  2.3*  DDIMER  --   --   --   --  0.58*  --   --   --   --   INR 3.4* 3.4* 1.8*  --  1.6*  --   --   --   --   HGBA1C  --  5.2  --   --   --   --   --   --   --   MG  --  1.8  --   --   --   --  1.9 2.0  --   CALCIUM 9.1 8.5*  --  7.9*  --  8.4* 8.0*  --  8.3*    Radiology Reports No results found.    Signature  -   Susa Raring M.D on 07/06/2022 at 10:09 AM   -  To page go to [email or web address]

## 2022-07-07 DIAGNOSIS — K5791 Diverticulosis of intestine, part unspecified, without perforation or abscess with bleeding: Secondary | ICD-10-CM | POA: Diagnosis not present

## 2022-07-07 LAB — CBC WITH DIFFERENTIAL/PLATELET
Abs Immature Granulocytes: 0.04 10*3/uL (ref 0.00–0.07)
Basophils Absolute: 0 10*3/uL (ref 0.0–0.1)
Basophils Relative: 0 %
Eosinophils Absolute: 0 10*3/uL (ref 0.0–0.5)
Eosinophils Relative: 1 %
HCT: 23.5 % — ABNORMAL LOW (ref 36.0–46.0)
Hemoglobin: 7.7 g/dL — ABNORMAL LOW (ref 12.0–15.0)
Immature Granulocytes: 1 %
Lymphocytes Relative: 12 %
Lymphs Abs: 0.7 10*3/uL (ref 0.7–4.0)
MCH: 29.7 pg (ref 26.0–34.0)
MCHC: 32.8 g/dL (ref 30.0–36.0)
MCV: 90.7 fL (ref 80.0–100.0)
Monocytes Absolute: 0.6 10*3/uL (ref 0.1–1.0)
Monocytes Relative: 10 %
Neutro Abs: 4.8 10*3/uL (ref 1.7–7.7)
Neutrophils Relative %: 76 %
Platelets: 105 10*3/uL — ABNORMAL LOW (ref 150–400)
RBC: 2.59 MIL/uL — ABNORMAL LOW (ref 3.87–5.11)
RDW: 20.2 % — ABNORMAL HIGH (ref 11.5–15.5)
WBC: 6.2 10*3/uL (ref 4.0–10.5)
nRBC: 1.3 % — ABNORMAL HIGH (ref 0.0–0.2)

## 2022-07-07 LAB — PREPARE RBC (CROSSMATCH)

## 2022-07-07 MED ORDER — SODIUM CHLORIDE 0.9% IV SOLUTION: Freq: Once | INTRAVENOUS | Status: AC

## 2022-07-07 NOTE — Progress Notes (Signed)
[name] 5:13 PM  Subjective: Patient seen and examined and we were called back because of dropping hemoglobin however she has had no bowel movements since Monday and has not seen any blood or black bowel movements since last week she is on a blood thinner at home and her hospital computer chart and our office computer chart was reviewed and she is eating solid food  Objective: Signs stable afebrile no acute distress abdomen is soft nontender BUN and creatinine stable hemoglobin not too bad if you and your you her posttransfusion 11.5 being unusually high and her 9.9 possibly being a little too high than expected and on CT she did have a small buttock hematoma and you may want to look at that to make sure that is not expanding  Assessment: Anemia with GI bleeding 5 days ago doubt obvious signs of recurrence  Plan: Will follow her labs on the computer but please call us back if active bleeding and may want to recheck hematoma on rounds tomorrow to see if that needs any repeat CT evaluation  [facility]  office [phone] After 5PM or if no answer call [phone]

## 2022-07-07 NOTE — Plan of Care (Signed)

## 2022-07-07 NOTE — Progress Notes (Signed)
PROGRESS NOTE                                                                                                                                                                                                             Patient Demographics:    Lindsey Huber, is a 77 y.o. 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  Hgb 10.5 and dropped to 7.0 the next morning.  She became hypotensive.  Transfused 4 units so far during this hospitalization, received Kcentra, vitamin K and started on basal pressors and admitted to ICU.  CT angio showed extravasation at the junction of descending and sigmoid colon and duodenal enteritis.  GI and IR consulted.  She was transferred out of ICU on 07/05/2022 and to my service on 07/06/2022, no further bleeding as far as patient can tell overnight.   Subjective:    [name] today has, No headache, No chest pain, No abdominal pain, she denies any bowel movement over the last couple days.   Assessment  & Plan :    Hemorrhagic shock due to acute blood loss caused from lower GI bleed most likely diverticular in the presence of Xarelto use.  she has been seen by PCCM, [facility] along with IR, her Xarelto was reversed with Kcentra and vitamin K, she has received total of 4 units of PRBCs this admission, currently on soft diet H&H is  still fluctuating, continue to monitor another 24 hours, goal will be to keep hemoglobin above 8, patient agreeable for further transfusions if needed.  -Her hemoglobin continues to drop, it did drop around 4 g over the last 48 hours 11.5>> 7.7, will transfuse 1 unit PRBC, as discussed with the patient, GI will reassess today regarding further recommendations.  History of paroxysmal atrial fibrillation, sick sinus syndrome s/p pacemaker placement.  [place] vas 2 score of greater than 3.  Blood pressure still soft, beta-blocker on hold, Xarelto on hold.  Per GI Xarelto could be resumed 3 days after H&H is stable.  Chronic diastolic CHF.  Currently compensated.  History of valvular heart disease s/p bioprosthetic AVR, MV ring, bioprosthetic TVR.  Supportive care.  Beta-blocker anticoagulation resume once blood pressure and H&H stabilizes.  COPD, ILD.  Follows with [name].  Not on home oxygen, on chronic prednisone which will be continued, resumed home Plaquenil and resume Ofev upon discharge.  Supportive care with nebulizer treatments.  HX of lupus.  On prednisone, takes Plaquenil at home.  CKD stage IIIa.  Creatinine close to baseline monitor.  HX of fall.  Bilateral ankle injury and fractures.  Supposed to wear pro-form boots when she is out of bed, requested to use both foot pro-form boots whenever she is out of bed.  Thrombocytopenia.  Likely due to bleeding.  Improving.      Condition - Fair  Family Communication  : None at bedside  Code Status :  Full  Consults  :  [facility], PCCM, IR  PUD Prophylaxis : PPI   Procedures  :           Disposition Plan  :    Status is: Inpatient  DVT Prophylaxis  :    Place and maintain sequential compression device Start: 07/07/22 0714 Place and maintain sequential compression device Start: 07/06/22 1009 SCDs Start: 07/02/22 0455   Lab Results  Component Value Date   PLT 105 (L) 07/07/2022    Diet :  Diet Order              DIET DYS 3 Room service appropriate? Yes; Fluid consistency: Thin  Diet effective now                    Inpatient Medications  Scheduled Meds:  allopurinol  100 mg Oral Daily   Chlorhexidine Gluconate Cloth  6 each Topical Daily   hydroxychloroquine  200 mg Oral Daily   levothyroxine  25 mcg Oral Q0600   metoprolol tartrate  12.5 mg Oral BID   mometasone-formoterol  2 puff Inhalation BID   OLANZapine  2.5 mg Oral QHS   pantoprazole  40 mg Oral BID   predniSONE  10 mg Oral Q breakfast   umeclidinium bromide  1 puff Inhalation Daily   Continuous Infusions: PRN Meds:.acetaminophen, docusate sodium, mouth rinse, polyethylene glycol  Antibiotics  :    Anti-infectives (From admission, onward)    Start     Dose/Rate Route Frequency Ordered Stop   07/04/22 1000  hydroxychloroquine (PLAQUENIL) tablet 200 mg        200 mg Oral Daily 07/04/22 0717           Objective:   Vitals:   07/07/22 0945 07/07/22 1000 07/07/22 1200 07/07/22 1338  BP: (!) 119/53 (!) 103/59 122/69 119/66  Pulse: 73 70 68 70  Resp: (!) 27 20 (!) 24 (!) 21  Temp: 97.9 F (36.6 C) 98.1 F (36.7 C) 98 F (36.7 C) 98 F (36.7 C)  TempSrc: Oral Oral Oral Oral  SpO2: 100% 94% 100% 98%  Weight:      Height:        Wt Readings from Last 3 Encounters:  07/07/22 62.2 kg  06/25/22 55.8 kg  05/03/22 56.2 kg     Intake/Output Summary (Last  24 hours) at 07/07/2022 1347 Last data filed at 07/07/2022 1338 Gross per 24 hour  Intake 444 ml  Output 1550 ml  Net -1106 ml     Physical Exam  Awake Alert, Oriented X 3, frail Symmetrical Chest wall movement, Good air movement bilaterally, CTAB RRR,No Gallops,Rubs or new Murmurs, No Parasternal Heave +ve B.Sounds, Abd Soft, No tenderness, No rebound - guarding or rigidity. No Cyanosis, Clubbing or edema, No new Rash or bruise     Data Review:    Recent Labs  Lab 07/01/22 2349 07/02/22 0323 07/05/22 1201 07/05/22 1840 07/05/22 2132  07/06/22 0403 07/06/22 1234 07/07/22 0208  WBC 7.6   < > 9.2  --  8.1 7.0 8.1 6.2  HGB 10.5*   < > 11.5* 9.0* 9.9* 8.4* 8.4* 7.7*  HCT 32.6*   < > 33.0* 27.3* 30.3* 25.6* 25.8* 23.5*  PLT 143*   < > 77*  --  97* 97* 103* 105*  MCV 100.6*   < > 88.5  --  92.9 90.1 90.2 90.7  MCH 32.4   < > 30.8  --  30.4 29.6 29.4 29.7  MCHC 32.2   < > 34.8  --  32.7 32.8 32.6 32.8  RDW 13.6   < > 19.9*  --  20.2* 20.2* 20.1* 20.2*  LYMPHSABS 1.3  --   --   --   --   --   --  0.7  MONOABS 0.4  --   --   --   --   --   --  0.6  EOSABS 0.1  --   --   --   --   --   --  0.0  BASOSABS 0.0  --   --   --   --   --   --  0.0   < > = values in this interval not displayed.    Recent Labs  Lab 07/01/22 2349 07/02/22 0940 07/03/22 0347 07/03/22 1610 07/03/22 0941 07/04/22 0128 07/05/22 0232 07/05/22 2132 07/06/22 0403  NA 134* 137  --  140  --  136 133*  --  133*  K 4.4 3.7  --  4.1  --  3.5 4.0  --  4.3  CL 100 106  --  106  --  102 103  --  105  CO2 24 22  --  23  --  23 21*  --  22  ANIONGAP 10 9  --  11  --  11 9  --  6  GLUCOSE 138* 113*  --  114*  --  109* 106*  --  108*  BUN 27* 24*  --  25*  --  21 18  --  15  CREATININE 1.16* 1.11*  --  1.44*  --  1.40* 1.25*  --  0.99  AST 36  --   --   --   --   --  56*  --   --   ALT 25  --   --   --   --   --  43  --   --   ALKPHOS 73  --   --   --   --   --  105  --   --   BILITOT 0.6  --   --   --   --   --  2.1*  --   --   ALBUMIN 3.4*  --   --   --   --   --  2.1*  --  2.3*  DDIMER  --   --   --   --  0.58*  --   --   --   --   INR 3.4* 3.4* 1.8*  --  1.6*  --   --   --   --   HGBA1C  --  5.2  --   --   --   --   --   --   --   MG  --  1.8  --   --   --   --  1.9 2.0  --   CALCIUM 9.1 8.5*  --  7.9*  --  8.4* 8.0*  --  8.3*    Radiology Reports No results found.    Signature  -   Huey Bienenstock M.D on 07/07/2022 at 1:47 PM   -  To page go to [email or web address]

## 2022-07-07 NOTE — Progress Notes (Signed)
Physical Therapy Treatment Patient Details Name: Isreal Runquist MRN: [id number] DOB: October 21, 1945 Today's Date: 07/07/2022   History of Present Illness Pt is a 77 y.o. 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 Pt requires minA to stand with increased time to obtain upright posture and progress hands onto the walker. Pt ambulated in the room today with minG for safety and balance but no overt LOB noted, reliant on RW and with cautious gait. Pt fatigues with mobility but recovers well with seated rest breaks. Discharge recommendations remain appropriate, acute PT will continue to follow up with pt to progress mobility during admission.     Recommendations for follow up therapy are one component of a multi-disciplinary discharge planning process, led by the attending physician.  Recommendations may be updated based on patient status, additional functional criteria and insurance authorization.  Follow Up Recommendations       Assistance Recommended at Discharge Frequent or constant Supervision/Assistance  Patient can return home with the following A little help with walking and/or transfers;A little help with bathing/dressing/bathroom;Assistance with cooking/housework;Assist for transportation;Help with stairs or ramp for entrance   Equipment Recommendations  None recommended by PT    Recommendations for Other Services       Precautions / Restrictions Precautions Precautions: Fall Required Braces or Orthoses: Other Brace Other Brace: CAM boot, okay to remove boot in bed Restrictions Weight Bearing Restrictions: Yes LLE Weight Bearing: Weight bearing as tolerated Other  Position/Activity Restrictions: WBAT with CAM boot     Mobility  Bed Mobility Overal bed mobility: Needs Assistance Bed Mobility: Supine to Sit     Supine to sit: Min guard, HOB elevated     General bed mobility comments: increased time for bed mobility with use of bed rails    Transfers Overall transfer level: Needs assistance Equipment used: Rolling walker (2 wheels) Transfers: Bed to chair/wheelchair/BSC, Sit to/from Stand Sit to Stand: Min assist   Step pivot transfers: Min assist       General transfer comment: minA to power up to standing with increased time and steady, cued for proper hand placement. Increased time to achieve full upright posture. Able to step over to chair but requiring a seated rest break due to fatigue, vitals stable but noted RR increased    Ambulation/Gait Ambulation/Gait assistance: Min guard Gait Distance (Feet): 15 Feet Assistive device: Rolling walker (2 wheels) Gait Pattern/deviations: Step-through pattern, Decreased stride length, Antalgic, Trunk flexed Gait velocity: decreased     General Gait Details: pt cleared for WBAT on LLE with CAM boot, able to ambulate in room briefly, antalgic gait on LLE with increased time for ambulation, cueing for upright posture and forward gaze   Stairs             Wheelchair Mobility    Modified Rankin (Stroke Patients Only)       Balance Overall balance assessment: Needs assistance Sitting-balance support: Feet supported, No upper extremity supported Sitting balance-Leahy Scale: Fair     Standing balance support: Reliant on assistive device for balance, Bilateral upper extremity supported, During functional activity Standing balance-Leahy Scale: Poor Standing balance comment: reliant on RW  Cognition Arousal/Alertness: Awake/alert Behavior During Therapy: WFL for tasks assessed/performed Overall Cognitive Status: Within Functional Limits for  tasks assessed                                 General Comments: pleasant throughout session        Exercises      General Comments General comments (skin integrity, edema, etc.): VSS on room air, fatigues with ambulation but recovers well with seated rest break      Pertinent Vitals/Pain Pain Assessment Pain Assessment: No/denies pain    Home Living                          Prior Function            PT Goals (current goals can now be found in the care plan section) Acute Rehab PT Goals Patient Stated Goal: home PT Goal Formulation: With patient Time For Goal Achievement: 07/19/22 Potential to Achieve Goals: Good Progress towards PT goals: Progressing toward goals    Frequency    Min 3X/week      PT Plan Current plan remains appropriate    Co-evaluation              AM-PAC PT "6 Clicks" Mobility   Outcome Measure  Help needed turning from your back to your side while in a flat bed without using bedrails?: A Little Help needed moving from lying on your back to sitting on the side of a flat bed without using bedrails?: A Little Help needed moving to and from a bed to a chair (including a wheelchair)?: A Little Help needed standing up from a chair using your arms (e.g., wheelchair or bedside chair)?: A Little Help needed to walk in hospital room?: A Little Help needed climbing 3-5 steps with a railing? : Total 6 Click Score: 16    End of Session Equipment Utilized During Treatment: Gait belt Activity Tolerance: Patient tolerated treatment well Patient left: in chair;with call bell/phone within reach;with chair alarm set Nurse Communication: Mobility status PT Visit Diagnosis: Other abnormalities of gait and mobility (R26.89)     Time: 4098-1191 PT Time Calculation (min) (ACUTE ONLY): 23 min  Charges:  $Gait Training: 8-22 mins $Therapeutic Activity: 8-22 mins                     Lindalou Hose, PT DPT Acute Rehabilitation  Services Office [phone]    Leonie Man 07/07/2022, 4:38 PM

## 2022-07-08 ENCOUNTER — Other Ambulatory Visit (HOSPITAL_COMMUNITY): Payer: Medicare HMO

## 2022-07-08 DIAGNOSIS — K5791 Diverticulosis of intestine, part unspecified, without perforation or abscess with bleeding: Secondary | ICD-10-CM | POA: Diagnosis not present

## 2022-07-08 LAB — BASIC METABOLIC PANEL
Anion gap: 7 (ref 5–15)
BUN: 24 mg/dL — ABNORMAL HIGH (ref 8–23)
CO2: 24 mmol/L (ref 22–32)
Calcium: 8.4 mg/dL — ABNORMAL LOW (ref 8.9–10.3)
Chloride: 106 mmol/L (ref 98–111)
Creatinine, Ser: 1.14 mg/dL — ABNORMAL HIGH (ref 0.44–1.00)
GFR, Estimated: 50 mL/min — ABNORMAL LOW (ref >60)
Glucose, Bld: 102 mg/dL — ABNORMAL HIGH (ref 70–99)
Potassium: 4.3 mmol/L (ref 3.5–5.1)
Sodium: 137 mmol/L (ref 135–145)

## 2022-07-08 LAB — TYPE AND SCREEN
ABO/RH(D): A NEG
Antibody Screen: POSITIVE
Unit division: 0
Unit division: 0

## 2022-07-08 LAB — BPAM RBC
Blood Product Expiration Date: 202406202359
Blood Product Expiration Date: 202406202359
ISSUE DATE / TIME: 202406100354
ISSUE DATE / TIME: 202406120953
Unit Type and Rh: 600
Unit Type and Rh: 600

## 2022-07-08 LAB — CBC
HCT: 29.9 % — ABNORMAL LOW (ref 36.0–46.0)
Hemoglobin: 9.7 g/dL — ABNORMAL LOW (ref 12.0–15.0)
MCH: 29.4 pg (ref 26.0–34.0)
MCHC: 32.4 g/dL (ref 30.0–36.0)
MCV: 90.6 fL (ref 80.0–100.0)
Platelets: 124 10*3/uL — ABNORMAL LOW (ref 150–400)
RBC: 3.3 MIL/uL — ABNORMAL LOW (ref 3.87–5.11)
RDW: 21.4 % — ABNORMAL HIGH (ref 11.5–15.5)
WBC: 7.2 10*3/uL (ref 4.0–10.5)
nRBC: 0.6 % — ABNORMAL HIGH (ref 0.0–0.2)

## 2022-07-08 MED ORDER — HEPARIN SODIUM (PORCINE) 5000 UNIT/ML IJ SOLN
5000.0000 [IU] | Freq: Three times a day (TID) | INTRAMUSCULAR | Status: DC
  Administered 2022-07-08 – 2022-07-10 (×6): 5000 [IU] via SUBCUTANEOUS
  Filled 2022-07-08 (×6): qty 1

## 2022-07-08 MED ORDER — OYSTER SHELL CALCIUM/D3 500-5 MG-MCG PO TABS
2.0000 | ORAL_TABLET | Freq: Two times a day (BID) | ORAL | Status: DC
  Administered 2022-07-08 – 2022-07-10 (×4): 2 via ORAL
  Filled 2022-07-08 (×5): qty 2

## 2022-07-08 NOTE — Plan of Care (Signed)

## 2022-07-08 NOTE — Progress Notes (Signed)
Occupational Therapy Treatment Patient Details Name: Lindsey Huber MRN: [id number] DOB: Jun 11, 1945 Today's Date: 07/08/2022   History of present illness Pt is a 77 y.o. 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 Pt agreeable to participation in skilled OT session. OT instructed pt in techniques for increased safety and independence with ADLs and functional transfers/mobility with a RW, including training in energy conservation techniques and pursed lip breathing with pt verbalizing and demonstrating understanding through teach back. Pt currently demonstrates ability to complete UB ADLs Independent to Supervision, LB ADLs with Min guard to Mod assist, functional transfers with Min guard assist, and functional mobility with CAM boot distances of approximately 20 feet in room with RW and with the need for frequent rest breaks. Pt continues to present with generalized B UE weakness, decreased activity tolerance, decreased standing tolerance during functional tasks, and decreased dynamic standing balance during functional tasks. Pt will benefit from continues acute skilled OT services to address deficits, decrease caregiver burden, and increase safety and independence with ADLs and functional transfers/mobility. Discharge plan updated. Post acute discharge, pt will benefit from continued skilled OT services in the home to maximize rehab potential.    Recommendations for follow up therapy are one component of a multi-disciplinary discharge planning process, led by the attending physician.  Recommendations may be updated based on patient status, additional functional criteria and insurance authorization.    Assistance Recommended at Discharge Frequent or  constant Supervision/Assistance  Patient can return home with the following  A little help with walking and/or transfers;A little help with bathing/dressing/bathroom;Assistance with cooking/housework;Assist for transportation;Help with stairs or ramp for entrance   Equipment Recommendations  None recommended by OT    Recommendations for Other Services      Precautions / Restrictions Precautions Precautions: Fall Restrictions Weight Bearing Restrictions: Yes LLE Weight Bearing: Weight bearing as tolerated Other Position/Activity Restrictions: WBAT with CAM boot       Mobility Bed Mobility               General bed mobility comments: Pt received sitting in recliner.    Transfers Overall transfer level: Needs assistance Equipment used: Rolling walker (2 wheels) Transfers: Bed to chair/wheelchair/BSC, Sit to/from Stand Sit to Stand: Min guard (with extra time)     Step pivot transfers: Min guard (with RW)           Balance Overall balance assessment: Needs assistance Sitting-balance support: Feet supported, No upper extremity supported Sitting balance-Leahy Scale: Fair     Standing balance support: Bilateral upper extremity supported, During functional activity, Reliant on assistive device for balance Standing balance-Leahy Scale: Poor                             ADL either performed or assessed with clinical judgement   ADL Overall ADL's : Needs assistance/impaired Eating/Feeding: Independent   Grooming: Independent;Sitting   Upper Body Bathing: Supervision/ safety;Sitting   Lower Body Bathing: Moderate assistance;Sitting/lateral leans   Upper Body Dressing : Modified independent;Sitting   Lower Body Dressing: Moderate assistance;Sit to/from stand   Toilet Transfer: Min guard;Ambulation;BSC/3in1;Rolling walker (2 wheels) (with extra time)   Toileting- Clothing Manipulation and Hygiene: Min guard;Cueing for compensatory  techniques;Sitting/lateral lean       Functional  mobility during ADLs: Min guard;Rolling walker (2 wheels) General ADL Comments: Pt funcitonal level limited by generalized B UE weakness and decreased activity tolerance with pt requiring frequent rest breaks during functional activities. OT educated pt in energy conservation techniques to increase pt safety and independence with ADLs and functional mobility with pt verbalizing understand. Pt will benefit from reinforcment of training.    Extremity/Trunk Assessment Upper Extremity Assessment Upper Extremity Assessment: Generalized weakness   Lower Extremity Assessment Lower Extremity Assessment: Defer to PT evaluation        Vision       Perception     Praxis Praxis Praxis: Intact    Cognition Arousal/Alertness: Awake/alert Behavior During Therapy: WFL for tasks assessed/performed Overall Cognitive Status: Within Functional Limits for tasks assessed                                 General Comments: pleasant throughout session        Exercises      Shoulder Instructions       General Comments VSS on RA throughout session. However, pt fatigues quickly and presents with occasional SOB. OT instructed pt in pursed lip breathing with pt demonstrating understanding through teach back. Pt requires frequent rest breaks during funcitonal tasks but recovers quickly with seated rest break.    Pertinent Vitals/ Pain       Pain Assessment Pain Assessment: No/denies pain  Home Living                                          Prior Functioning/Environment              Frequency  Min 2X/week        Progress Toward Goals  OT Goals(current goals can now be found in the care plan section)  Progress towards OT goals: Progressing toward goals  Acute Rehab OT Goals Patient Stated Goal: To return home  Plan Discharge plan needs to be updated    Co-evaluation                  AM-PAC OT "6 Clicks" Daily Activity     Outcome Measure   Help from another person eating meals?: None Help from another person taking care of personal grooming?: None (in sitting) Help from another person toileting, which includes using toliet, bedpan, or urinal?: A Little Help from another person bathing (including washing, rinsing, drying)?: A Lot Help from another person to put on and taking off regular upper body clothing?: None Help from another person to put on and taking off regular lower body clothing?: A Lot 6 Click Score: 19    End of Session Equipment Utilized During Treatment: Gait belt;Rolling walker (2 wheels)  OT Visit Diagnosis: Unsteadiness on feet (R26.81);Muscle weakness (generalized) (M62.81);Other (comment) (Decreased activity tolerance)   Activity Tolerance Other (comment) (Pt limited by decreased activity tolerance)   Patient Left in chair;in CPM;with chair alarm set   Nurse Communication Mobility status        Time: 1610-9604 OT Time Calculation (min): 26 min  Charges: OT General Charges $OT Visit: 1 Visit OT Treatments $Self Care/Home Management : 8-22 mins  Linzee Depaul "Orson Eva., OTR/L, MA Acute Rehab [phone]   Lendon Colonel 07/08/2022, 4:18 PM

## 2022-07-08 NOTE — Progress Notes (Signed)
Physical Therapy Treatment Patient Details Name: Lindsey Huber MRN: [id number] DOB: Jul 10, 1945 Today's Date: 07/08/2022   History of Present Illness Pt is a 77 y.o. 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 Pt reports recently finishing OT session but agreeable to PT session, ambulating ~30 feet it the room with RW and minG for safety, no seated or standing rest break required. Pt required minA to power up and steady with sit<>stand trials, improving to minG with repetition and verbal cueing for hip extension and forward gaze. Pt inquired about strengthening BLE, instructed in seated exercises to perform when not working with therapy, as well as reminding pt to continue to perform L ankle pumps when her boot isn't on as her ortho doctor prescribed. Requested pt to get her family to bring a shoe for her RLE to offset CAM boot height during ambulation. Pt continues to make good progress with acute PT, will continue to follow during admission, discharge recommendations remain appropriate.     Recommendations for follow up therapy are one component of a multi-disciplinary discharge planning process, led by the attending physician.  Recommendations may be updated based on patient status, additional functional criteria and insurance authorization.  Follow Up Recommendations       Assistance Recommended at Discharge Frequent or constant Supervision/Assistance  Patient can return home with the following A little help with walking and/or transfers;A little help with bathing/dressing/bathroom;Assistance with cooking/housework;Assist for transportation;Help with stairs or ramp for entrance   Equipment  Recommendations  None recommended by PT    Recommendations for Other Services       Precautions / Restrictions Precautions Precautions: Fall Required Braces or Orthoses: Other Brace Other Brace: CAM boot, okay to remove boot in bed Restrictions Weight Bearing Restrictions: Yes LLE Weight Bearing: Weight bearing as tolerated Other Position/Activity Restrictions: WBAT with CAM boot     Mobility  Bed Mobility               General bed mobility comments: pt sitting in recliner upon arrival of PT, ended session with pt in recliner    Transfers Overall transfer level: Needs assistance Equipment used: Rolling walker (2 wheels) Transfers: Sit to/from Stand Sit to Stand: Min guard, Min assist           General transfer comment: minA progressing to minG with increased time to power up from the chair. Cued for proper hand placement and hip extension upon standing for upright posture and improved balance without utilizing chair behind BLE. Progressing with repetition, performed 5 trials    Ambulation/Gait Ambulation/Gait assistance: Min guard Gait Distance (Feet): 30 Feet Assistive device: Rolling walker (2 wheels) Gait Pattern/deviations: Step-through pattern, Decreased stride length, Antalgic, Trunk flexed Gait velocity: decreased     General Gait Details: antalgic gait on LLE with increased time for ambulation, cueing for upright posture and forward gaze   Stairs             Wheelchair Mobility    Modified Rankin (Stroke Patients Only)       Balance Overall balance assessment: Needs assistance Sitting-balance support: Feet supported, No upper extremity supported Sitting balance-Leahy Scale: Fair     Standing balance support: Bilateral upper extremity supported, During functional activity, Reliant on assistive device for balance  Standing balance-Leahy Scale: Poor Standing balance comment: reliant on RW                             Cognition Arousal/Alertness: Awake/alert Behavior During Therapy: WFL for tasks assessed/performed Overall Cognitive Status: Within Functional Limits for tasks assessed                                 General Comments: pleasant throughout session        Exercises General Exercises - Lower Extremity Long Arc Quad: AROM, Both, 10 reps, Seated Hip Flexion/Marching: AROM, Both, 10 reps, Seated Toe Raises: AROM, Right, 10 reps, Seated Heel Raises: AROM, Right, 10 reps, Seated Other Exercises Other Exercises: sit<>stand x5 reps    General Comments General comments (skin integrity, edema, etc.): VSS on room air, intermittent poor SPO2 reading but monitor changed and sats in the 90s      Pertinent Vitals/Pain Pain Assessment Pain Assessment: No/denies pain    Home Living                          Prior Function            PT Goals (current goals can now be found in the care plan section) Acute Rehab PT Goals Patient Stated Goal: home PT Goal Formulation: With patient Time For Goal Achievement: 07/19/22 Potential to Achieve Goals: Good Progress towards PT goals: Progressing toward goals    Frequency    Min 3X/week      PT Plan Current plan remains appropriate    Co-evaluation              AM-PAC PT "6 Clicks" Mobility   Outcome Measure  Help needed turning from your back to your side while in a flat bed without using bedrails?: A Little Help needed moving from lying on your back to sitting on the side of a flat bed without using bedrails?: A Little Help needed moving to and from a bed to a chair (including a wheelchair)?: A Little Help needed standing up from a chair using your arms (e.g., wheelchair or bedside chair)?: A Little Help needed to walk in hospital room?: A Little Help needed climbing 3-5 steps with a railing? : A Lot 6 Click Score: 17    End of Session Equipment Utilized During Treatment: Gait belt Activity  Tolerance: Patient tolerated treatment well Patient left: in chair;with call bell/phone within reach;with chair alarm set Nurse Communication: Mobility status PT Visit Diagnosis: Other abnormalities of gait and mobility (R26.89)     Time: 1610-9604 PT Time Calculation (min) (ACUTE ONLY): 24 min  Charges:  $Gait Training: 8-22 mins $Therapeutic Exercise: 8-22 mins                     Lindalou Hose, PT DPT Acute Rehabilitation Services Office [phone]    Leonie Man 07/08/2022, 4:25 PM

## 2022-07-08 NOTE — Progress Notes (Addendum)
PROGRESS NOTE                                                                                                                                                                                                             Patient Demographics:    Lindsey Huber, is a 77 y.o. 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  Hgb 10.5 and dropped to 7.0 the next morning.  She became hypotensive.  Transfused 4 units so far during this hospitalization, received Kcentra, vitamin K and started on basal pressors and admitted to ICU.  CT angio showed extravasation at the junction of descending and sigmoid colon and duodenal enteritis.  GI and IR consulted.  She was transferred out of ICU on 07/05/2022 and to my service on 07/06/2022, no further bleeding as far as patient can tell overnight.   Subjective:    Lindsey Huber today has, No headache, No chest pain, No abdominal pain, she denies any bowel movements overnight, last 1 on Monday  Assessment  & Plan :    Hemorrhagic shock due to acute blood loss caused from lower GI bleed most likely diverticular in the presence of Xarelto use.  she has been seen by PCCM, [facility] along with IR, her Xarelto was reversed with Kcentra and vitamin K, she has received total of 4 units of PRBCs this admission, currently on soft diet H&H is  still fluctuating, continue to monitor another 24 hours, goal will be to keep hemoglobin above 8, patient agreeable for further transfusions if needed.  -She was transfused another unit PRBC yesterday for hemoglobin of 7.7, with good response, it is at 9.7 today, GI input greatly appreciated, unlikely continue with GI bleed in the setting of no BM over last  4 days . -Will continue to monitor off anticoagulation for next couple days and if stable can be resumed on Monday as discussed with Dr. Ewing Schlein   History of paroxysmal atrial fibrillation, sick sinus syndrome s/p pacemaker placement.  [place] vas 2 score of greater than 3.  Blood pressure still soft, beta-blocker on hold,  -Xarelto remains on hold, please see above discussion.    Chronic diastolic CHF.  Currently compensated.  History of valvular heart disease s/p bioprosthetic AVR, MV ring, bioprosthetic TVR.  Supportive care.  Beta-blocker anticoagulation resume once blood pressure and H&H stabilizes.  COPD, ILD.  Follows with [name].  Not on home oxygen, on chronic prednisone which will be continued, resumed home Plaquenil and resume Ofev upon discharge.  Supportive care with nebulizer treatments.  HX of lupus.  On prednisone, takes Plaquenil at home.  Patient with chronic steroid use, will start on vitamin D and calcium given multiple recent fall and fracture.  CKD stage IIIa.  Creatinine close to baseline monitor.  HX of fall.  Bilateral ankle injury and fractures.  Supposed to wear pro-form boots when she is out of bed, requested to use both foot pro-form boots whenever she is out of bed.  Thrombocytopenia.  Likely due to bleeding.  Improving.      Condition - Fair  Family Communication  : None at bedside  Code Status :  Full  Consults  :  [facility], PCCM, IR  PUD Prophylaxis : PPI   Procedures  :           Disposition Plan  :    Status is: Inpatient  DVT Prophylaxis  :    heparin injection 5,000 Units Start:  07/08/22 1400 Place and maintain sequential compression device Start: 07/07/22 0714 Place and maintain sequential compression device Start: 07/06/22 1009 SCDs Start: 07/02/22 0455   Lab Results  Component Value Date   PLT 124 (L) 07/08/2022    Diet :  Diet Order             DIET DYS 3 Room service appropriate? Yes; Fluid consistency: Thin  Diet effective now                    Inpatient Medications  Scheduled Meds:  allopurinol  100 mg Oral Daily   Chlorhexidine Gluconate Cloth  6 each Topical Daily   heparin injection (subcutaneous)  5,000 Units Subcutaneous Q8H   hydroxychloroquine  200 mg Oral Daily   levothyroxine  25 mcg Oral Q0600   metoprolol tartrate  12.5 mg Oral BID   mometasone-formoterol  2 puff Inhalation BID   OLANZapine  2.5 mg Oral QHS   pantoprazole  40 mg Oral BID   predniSONE  10 mg Oral Q breakfast   umeclidinium bromide  1 puff Inhalation Daily   Continuous Infusions: PRN Meds:.acetaminophen, docusate sodium, mouth rinse, polyethylene glycol  Antibiotics  :    Anti-infectives (From admission, onward)    Start     Dose/Rate Route Frequency Ordered Stop   07/04/22 1000  hydroxychloroquine (PLAQUENIL) tablet 200 mg        200 mg Oral Daily 07/04/22 0717           Objective:   Vitals:   07/08/22 0800 07/08/22 0802 07/08/22 0803 07/08/22 1200  BP: 123/63     Pulse: 70     Resp: (!) 25     Temp: 97.8 F (36.6 C)   97.7 F (36.5 C)  TempSrc: Oral  Oral  SpO2: 99% 97% 99%   Weight:      Height:        Wt Readings from Last 3 Encounters:  07/07/22 62.2 kg  06/25/22 55.8 kg  05/03/22 56.2 kg     Intake/Output Summary (Last 24 hours) at 07/08/2022 1401 Last data filed at 07/08/2022 1200 Gross per 24 hour  Intake 360 ml  Output 1325 ml  Net -965 ml     Physical Exam  Awake Alert, Oriented X 3, frail Symmetrical Chest wall movement, Good air movement bilaterally RRR,No Gallops,Rubs or new Murmurs, No Parasternal  Heave +ve B.Sounds, Abd Soft, No tenderness, No rebound - guarding or rigidity. No Cyanosis, Clubbing or edema, No new Rash or bruise, I could not appreciate any significant hematoma in the buttocks or hip areas.    Data Review:    Recent Labs  Lab 07/01/22 2349 07/02/22 0323 07/05/22 2132 07/06/22 0403 07/06/22 1234 07/07/22 0208 07/08/22 0246  WBC 7.6   < > 8.1 7.0 8.1 6.2 7.2  HGB 10.5*   < > 9.9* 8.4* 8.4* 7.7* 9.7*  HCT 32.6*   < > 30.3* 25.6* 25.8* 23.5* 29.9*  PLT 143*   < > 97* 97* 103* 105* 124*  MCV 100.6*   < > 92.9 90.1 90.2 90.7 90.6  MCH 32.4   < > 30.4 29.6 29.4 29.7 29.4  MCHC 32.2   < > 32.7 32.8 32.6 32.8 32.4  RDW 13.6   < > 20.2* 20.2* 20.1* 20.2* 21.4*  LYMPHSABS 1.3  --   --   --   --  0.7  --   MONOABS 0.4  --   --   --   --  0.6  --   EOSABS 0.1  --   --   --   --  0.0  --   BASOSABS 0.0  --   --   --   --  0.0  --    < > = values in this interval not displayed.    Recent Labs  Lab 07/01/22 2349 07/02/22 0940 07/03/22 0347 07/03/22 1610 07/03/22 0941 07/04/22 0128 07/05/22 0232 07/05/22 2132 07/06/22 0403 07/08/22 0246  NA 134* 137  --  140  --  136 133*  --  133* 137  K 4.4 3.7  --  4.1  --  3.5 4.0  --  4.3 4.3  CL 100 106  --  106  --  102 103  --  105 106  CO2 24 22  --  23  --  23 21*  --  22 24  ANIONGAP 10 9  --  11  --  11 9  --  6 7  GLUCOSE 138* 113*  --  114*  --  109* 106*  --  108* 102*  BUN 27* 24*  --  25*  --  21 18  --  15 24*  CREATININE 1.16* 1.11*  --  1.44*  --  1.40* 1.25*  --  0.99 1.14*  AST 36  --   --   --   --   --  56*  --   --   --   ALT 25  --   --   --   --   --  43  --   --   --   ALKPHOS 73  --   --   --   --   --  105  --   --   --  BILITOT 0.6  --   --   --   --   --  2.1*  --   --   --   ALBUMIN 3.4*  --   --   --   --   --  2.1*  --  2.3*  --   DDIMER  --   --   --   --  0.58*  --   --   --   --   --   INR 3.4* 3.4* 1.8*  --  1.6*  --   --   --   --   --   HGBA1C  --  5.2  --   --   --   --   --    --   --   --   MG  --  1.8  --   --   --   --  1.9 2.0  --   --   CALCIUM 9.1 8.5*  --  7.9*  --  8.4* 8.0*  --  8.3* 8.4*    Radiology Reports No results found.    Signature  -   Huey Bienenstock M.D on 07/08/2022 at 2:01 PM   -  To page go to [email or web address]

## 2022-07-09 DIAGNOSIS — K5791 Diverticulosis of intestine, part unspecified, without perforation or abscess with bleeding: Secondary | ICD-10-CM | POA: Diagnosis not present

## 2022-07-09 DIAGNOSIS — D62 Acute posthemorrhagic anemia: Secondary | ICD-10-CM | POA: Diagnosis not present

## 2022-07-09 LAB — BASIC METABOLIC PANEL
Anion gap: 10 (ref 5–15)
BUN: 29 mg/dL — ABNORMAL HIGH (ref 8–23)
CO2: 20 mmol/L — ABNORMAL LOW (ref 22–32)
Calcium: 8.7 mg/dL — ABNORMAL LOW (ref 8.9–10.3)
Chloride: 107 mmol/L (ref 98–111)
Creatinine, Ser: 0.9 mg/dL (ref 0.44–1.00)
GFR, Estimated: >60 mL/min (ref >60)
Glucose, Bld: 92 mg/dL (ref 70–99)
Potassium: 3.7 mmol/L (ref 3.5–5.1)
Sodium: 137 mmol/L (ref 135–145)

## 2022-07-09 LAB — GLUCOSE, CAPILLARY
Glucose-Capillary: 102 mg/dL — ABNORMAL HIGH (ref 70–99)
Glucose-Capillary: 112 mg/dL — ABNORMAL HIGH (ref 70–99)
Glucose-Capillary: 123 mg/dL — ABNORMAL HIGH (ref 70–99)

## 2022-07-09 LAB — CBC
HCT: 30.7 % — ABNORMAL LOW (ref 36.0–46.0)
Hemoglobin: 9.9 g/dL — ABNORMAL LOW (ref 12.0–15.0)
MCH: 30.6 pg (ref 26.0–34.0)
MCHC: 32.2 g/dL (ref 30.0–36.0)
MCV: 94.8 fL (ref 80.0–100.0)
Platelets: 135 10*3/uL — ABNORMAL LOW (ref 150–400)
RBC: 3.24 MIL/uL — ABNORMAL LOW (ref 3.87–5.11)
RDW: 21.5 % — ABNORMAL HIGH (ref 11.5–15.5)
WBC: 7.6 10*3/uL (ref 4.0–10.5)
nRBC: 0.3 % — ABNORMAL HIGH (ref 0.0–0.2)

## 2022-07-09 MED ORDER — SENNOSIDES-DOCUSATE SODIUM 8.6-50 MG PO TABS
1.0000 | ORAL_TABLET | Freq: Two times a day (BID) | ORAL | Status: DC
  Administered 2022-07-09 – 2022-07-10 (×3): 1 via ORAL
  Filled 2022-07-09 (×3): qty 1

## 2022-07-09 MED ORDER — INSULIN ASPART 100 UNIT/ML IJ SOLN: 0.0000 [IU] | Freq: Three times a day (TID) | INTRAMUSCULAR | Status: DC

## 2022-07-09 NOTE — Progress Notes (Signed)
Mobility Specialist Progress Note   07/09/22 1625  Mobility  Activity Ambulated with assistance in hallway  Level of Assistance Contact guard assist, steadying assist  Assistive Device Front wheel walker  Distance Ambulated (ft) 44 ft  LLE Weight Bearing WBAT (w/ CAM boot)  Activity Response Tolerated well  Mobility Referral Yes  $Mobility charge 1 Mobility  Mobility Specialist Start Time (ACUTE ONLY) 1606  Mobility Specialist Stop Time (ACUTE ONLY) 1623  Mobility Specialist Time Calculation (min) (ACUTE ONLY) 17 min   Pt received in bed tired but agreeable to mobility. Able to get EOB w/ stand by assist and stand w/ CGA. Pt having steady gait during ambulation but having decreased activity tolerance. Returned back to bed w/o fault but pt expressing OSB and fatigue. SpO2 floating at 97% on RA, encouraged PLB for ~ and pt symptoms subsided. Left w/ call bell in reach and RN present in room.   Frederico Hamman Mobility Specialist Please contact via SecureChat or  Rehab office at [phone]

## 2022-07-09 NOTE — TOC Progression Note (Signed)
Transition of Care [facility]) - Progression Note    Patient Details  Name: Lindsey Huber MRN: [id number] Date of Birth: 01-09-1946  Transition of Care [facility]) CM/SW Contact  Gordy Clement, RN Phone Number: 07/09/2022, 10:28 AM  Clinical Narrative:    Patient from home.  Will dc back to home and [facility] will provide PT and OT Home Health. No DME needed- Patient has all DME in the home.    TOC will continue to follow patient for any additional discharge needs  .      Expected Discharge Plan: Home w Home Health Services Barriers to Discharge: Continued Medical Work up  Expected Discharge Plan and Services   Discharge Planning Services: CM Consult Post Acute Care Choice: Home Health Living arrangements for the past 2 months: Apartment                 DME Arranged: N/A DME Agency: NA       HH Arranged: PT, OT, Nurse's Aide HH Agency: [facility] Date HH Agency Contacted: 07/05/22 Time HH Agency Contacted: 1220 Representative spoke with at [facility] Agency: Amy   Social Determinants of Health (SDOH) Interventions SDOH Screenings   Food Insecurity: No Food Insecurity (12/26/2021)  Housing: Low Risk  (12/26/2021)  Transportation Needs: No Transportation Needs (12/26/2021)  Utilities: Not At Risk (12/26/2021)  Tobacco Use: Low Risk  (07/02/2022)    Readmission Risk Interventions    07/05/2022   12:36 PM 12/27/2021    3:07 PM  Readmission Risk Prevention Plan  Transportation Screening Complete Complete  PCP or Specialist Appt within 3-5 Days Complete Complete  HRI or Home Care Consult Complete Complete  Social Work Consult for Recovery Care Planning/Counseling Complete Complete  Palliative Care Screening Not Applicable Complete  Medication Review Oceanographer) Referral to Pharmacy Complete

## 2022-07-09 NOTE — Progress Notes (Signed)
PROGRESS NOTE                                                                                                                                                                                                             Patient Demographics:    Lindsey Huber, is a 77 y.o. 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  Hgb 10.5 and dropped to 7.0 the next morning.  She became hypotensive.  Transfused 4 units so far during this hospitalization, received Kcentra, vitamin K and started on basal pressors and admitted to ICU.  CT angio showed extravasation at the junction of descending and sigmoid colon and duodenal enteritis.  GI and IR consulted.  She was transferred out of ICU on 07/05/2022 and to my service on 07/06/2022, no further bleeding as far as patient can tell overnight.   Subjective:    Lindsey Huber today denies any complaints, had BM yesterday normal in color.     Assessment  & Plan :    Hemorrhagic shock due to acute blood loss caused from lower GI bleed most likely diverticular in the presence of Xarelto use.  she has been seen by PCCM, [facility] along with IR, her Xarelto was reversed with Kcentra and vitamin K, she has received total of 4 units of PRBCs this admission, currently on soft diet H&H is still fluctuating, continue to monitor another 24  hours, goal will be to keep hemoglobin above 8, patient agreeable for further transfusions if needed.  -She was transfused another unit PRBC yesterday for hemoglobin of 7.7, with good response, it is at 9.7 today, GI input greatly appreciated, unlikely continue with GI bleed in the setting of no BM over last 4 days .  Did have BM  overnight, which was normal in color, will check her CBC by tomorrow, and if hemoglobin remained stable she can be discharged with recommendation to resume her Xarelto by Monday.  History of paroxysmal atrial fibrillation, sick sinus syndrome s/p pacemaker placement.  [place] vas 2 score of greater than 3.  Blood pressure still soft, beta-blocker on hold,  -Xarelto remains on hold, please see above discussion.    Chronic diastolic CHF.  Currently compensated.  History of valvular heart disease s/p bioprosthetic AVR, MV ring, bioprosthetic TVR.  Supportive care.  Beta-blocker anticoagulation resume once blood pressure and H&H stabilizes.  COPD, ILD.  Follows with [name].  Not on home oxygen, on chronic prednisone which will be continued, resumed home Plaquenil and resume Ofev upon discharge.  Supportive care with nebulizer treatments.  HX of lupus.  On prednisone, takes Plaquenil at home.  Patient with chronic steroid use, will start on vitamin D and calcium given multiple recent fall and fracture.  CKD stage IIIa.  Creatinine close to baseline monitor.  HX of fall.  Bilateral ankle injury and fractures.  Supposed to wear pro-form boots when she is out of bed, requested to use both foot pro-form boots whenever she is out of bed.  Thrombocytopenia.  Likely due to bleeding.  Improving.      Condition - Fair  Family Communication  : None at bedside  Code Status :  Full  Consults  :  [facility], PCCM, IR  PUD Prophylaxis : PPI   Procedures  :           Disposition Plan  :    Status is: Inpatient  DVT Prophylaxis  :    heparin injection 5,000 Units  Start: 07/08/22 1400 Place and maintain sequential compression device Start: 07/07/22 0714 Place and maintain sequential compression device Start: 07/06/22 1009 SCDs Start: 07/02/22 0455   Lab Results  Component Value Date   PLT 135 (L) 07/09/2022    Diet :  Diet Order             DIET DYS 3 Room service appropriate? Yes; Fluid consistency: Thin  Diet effective now                    Inpatient Medications  Scheduled Meds:  allopurinol  100 mg Oral Daily   calcium-vitamin D  2 tablet Oral BID   Chlorhexidine Gluconate Cloth  6 each Topical Daily   heparin injection (subcutaneous)  5,000 Units Subcutaneous Q8H   hydroxychloroquine  200 mg Oral Daily   insulin aspart  0-15 Units Subcutaneous TID WC   levothyroxine  25 mcg Oral Q0600   metoprolol tartrate  12.5 mg Oral BID   mometasone-formoterol  2 puff Inhalation BID   OLANZapine  2.5 mg Oral QHS   pantoprazole  40 mg Oral BID   predniSONE  10 mg Oral Q breakfast   senna-docusate  1 tablet Oral BID   umeclidinium bromide  1 puff Inhalation Daily   Continuous Infusions: PRN Meds:.acetaminophen, docusate sodium, mouth rinse, polyethylene glycol  Antibiotics  :    Anti-infectives (From admission, onward)    Start     Dose/Rate Route Frequency Ordered Stop   07/04/22 1000  hydroxychloroquine (PLAQUENIL) tablet 200 mg        200 mg Oral Daily 07/04/22 0717           Objective:   Vitals:   07/09/22 0823 07/09/22 0825 07/09/22 1205 07/09/22 1400  BP: (!) 113/56 Marland Kitchen)  113/56 127/61   Pulse: 70 69 69 70  Resp: 17 17 20 19   Temp:  (!) 97.4 F (36.3 C) 98.8 F (37.1 C)   TempSrc:  Oral Oral   SpO2: 97% 99% 98% 100%  Weight:      Height:        Wt Readings from Last 3 Encounters:  07/09/22 61.5 kg  06/25/22 55.8 kg  05/03/22 56.2 kg     Intake/Output Summary (Last 24 hours) at 07/09/2022 1449 Last data filed at 07/09/2022 0825 Gross per 24 hour  Intake 240 ml  Output 425 ml  Net -185 ml      Physical Exam  Awake Alert, Oriented X 3, frail Symmetrical Chest wall movement, Good air movement bilaterally, CTAB RRR,No Gallops,Rubs or new Murmurs, No Parasternal Heave +ve B.Sounds, Abd Soft, No tenderness, No rebound - guarding or rigidity. No Cyanosis, Clubbing or edema, No new Rash or bruise        Data Review:    Recent Labs  Lab 07/06/22 0403 07/06/22 1234 07/07/22 0208 07/08/22 0246 07/09/22 0744  WBC 7.0 8.1 6.2 7.2 7.6  HGB 8.4* 8.4* 7.7* 9.7* 9.9*  HCT 25.6* 25.8* 23.5* 29.9* 30.7*  PLT 97* 103* 105* 124* 135*  MCV 90.1 90.2 90.7 90.6 94.8  MCH 29.6 29.4 29.7 29.4 30.6  MCHC 32.8 32.6 32.8 32.4 32.2  RDW 20.2* 20.1* 20.2* 21.4* 21.5*  LYMPHSABS  --   --  0.7  --   --   MONOABS  --   --  0.6  --   --   EOSABS  --   --  0.0  --   --   BASOSABS  --   --  0.0  --   --     Recent Labs  Lab 07/03/22 0347 07/03/22 0938 07/03/22 0941 07/04/22 0128 07/05/22 0232 07/05/22 2132 07/06/22 0403 07/08/22 0246 07/09/22 0744  NA  --    < >  --  136 133*  --  133* 137 137  K  --    < >  --  3.5 4.0  --  4.3 4.3 3.7  CL  --    < >  --  102 103  --  105 106 107  CO2  --    < >  --  23 21*  --  22 24 20*  ANIONGAP  --    < >  --  11 9  --  6 7 10   GLUCOSE  --    < >  --  109* 106*  --  108* 102* 92  BUN  --    < >  --  21 18  --  15 24* 29*  CREATININE  --    < >  --  1.40* 1.25*  --  0.99 1.14* 0.90  AST  --   --   --   --  56*  --   --   --   --   ALT  --   --   --   --  43  --   --   --   --   ALKPHOS  --   --   --   --  105  --   --   --   --   BILITOT  --   --   --   --  2.1*  --   --   --   --   ALBUMIN  --   --   --   --  2.1*  --  2.3*  --   --   DDIMER  --   --  0.58*  --   --   --   --   --   --   INR 1.8*  --  1.6*  --   --   --   --   --   --   MG  --   --   --   --  1.9 2.0  --   --   --   CALCIUM  --    < >  --  8.4* 8.0*  --  8.3* 8.4* 8.7*   < > = values in this interval not displayed.    Radiology Reports No results found.     Signature  -   Huey Bienenstock M.D on 07/09/2022 at 2:49 PM   -  To page go to [email or web address]

## 2022-07-10 ENCOUNTER — Other Ambulatory Visit (HOSPITAL_COMMUNITY): Payer: Self-pay

## 2022-07-10 DIAGNOSIS — D62 Acute posthemorrhagic anemia: Secondary | ICD-10-CM | POA: Diagnosis not present

## 2022-07-10 DIAGNOSIS — K5791 Diverticulosis of intestine, part unspecified, without perforation or abscess with bleeding: Secondary | ICD-10-CM | POA: Diagnosis not present

## 2022-07-10 LAB — GLUCOSE, CAPILLARY
Glucose-Capillary: 99 mg/dL (ref 70–99)
Glucose-Capillary: 101 mg/dL — ABNORMAL HIGH (ref 70–99)

## 2022-07-10 LAB — BASIC METABOLIC PANEL
Anion gap: 9 (ref 5–15)
BUN: 30 mg/dL — ABNORMAL HIGH (ref 8–23)
CO2: 21 mmol/L — ABNORMAL LOW (ref 22–32)
Calcium: 8.6 mg/dL — ABNORMAL LOW (ref 8.9–10.3)
Chloride: 107 mmol/L (ref 98–111)
Creatinine, Ser: 1.12 mg/dL — ABNORMAL HIGH (ref 0.44–1.00)
GFR, Estimated: 51 mL/min — ABNORMAL LOW (ref >60)
Glucose, Bld: 98 mg/dL (ref 70–99)
Potassium: 4 mmol/L (ref 3.5–5.1)
Sodium: 137 mmol/L (ref 135–145)

## 2022-07-10 LAB — CBC
HCT: 28.3 % — ABNORMAL LOW (ref 36.0–46.0)
Hemoglobin: 9 g/dL — ABNORMAL LOW (ref 12.0–15.0)
MCH: 29.3 pg (ref 26.0–34.0)
MCHC: 31.8 g/dL (ref 30.0–36.0)
MCV: 92.2 fL (ref 80.0–100.0)
Platelets: 145 10*3/uL — ABNORMAL LOW (ref 150–400)
RBC: 3.07 MIL/uL — ABNORMAL LOW (ref 3.87–5.11)
RDW: 21.2 % — ABNORMAL HIGH (ref 11.5–15.5)
WBC: 7.1 10*3/uL (ref 4.0–10.5)
nRBC: 0.3 % — ABNORMAL HIGH (ref 0.0–0.2)

## 2022-07-10 LAB — PHOSPHORUS: Phosphorus: 3.3 mg/dL (ref 2.5–4.6)

## 2022-07-10 LAB — MAGNESIUM: Magnesium: 1.9 mg/dL (ref 1.7–2.4)

## 2022-07-10 MED ORDER — OYSTER SHELL CALCIUM/D3 500-5 MG-MCG PO TABS
2.0000 | ORAL_TABLET | Freq: Two times a day (BID) | ORAL | 0 refills | Status: AC
  Filled 2022-07-10: qty 120, 30d supply, fill #0

## 2022-07-10 MED ORDER — PANTOPRAZOLE SODIUM 40 MG PO TBEC
40.0000 mg | DELAYED_RELEASE_TABLET | Freq: Two times a day (BID) | ORAL | 0 refills | Status: DC
  Filled 2022-07-10: qty 60, 30d supply, fill #0

## 2022-07-10 MED ORDER — RIVAROXABAN 15 MG PO TABS: 15.0000 mg | ORAL_TABLET | Freq: Every day | ORAL | 1 refills | Status: DC

## 2022-07-10 MED ORDER — METOPROLOL TARTRATE 25 MG PO TABS
12.5000 mg | ORAL_TABLET | Freq: Two times a day (BID) | ORAL | 0 refills | Status: DC
  Filled 2022-07-10: qty 30, 30d supply, fill #0

## 2022-07-10 NOTE — Progress Notes (Signed)
Attempted to call [facility] pharmacy for medications pending discharge.  No answer.

## 2022-07-10 NOTE — TOC Transition Note (Signed)
Transition of Care [facility]) - CM/SW Discharge Note   Patient Details  Name: Lindsey Huber MRN: [id number] Date of Birth: June 29, 1945  Transition of Care [facility]) CM/SW Contact:  Ronny Bacon, RN Phone Number: 07/10/2022, 4:35 PM   Clinical Narrative:  Patient being discharged home today. Amy with [facility] notified of patient being discharged.      Final next level of care: Home w Home Health Services Barriers to Discharge: No Barriers Identified   Patient Goals and CMS Choice CMS Medicare.gov Compare Post Acute Care list provided to:: Patient Choice offered to / list presented to : Patient  Discharge Placement                         Discharge Plan and Services Additional resources added to the After Visit Summary for     Discharge Planning Services: CM Consult Post Acute Care Choice: Home Health          DME Arranged: N/A DME Agency: NA       HH Arranged: PT, OT, Nurse's Aide HH Agency: [facility] Date [facility] Agency Contacted: 07/10/22 Time HH Agency Contacted: 1610 Representative spoke with at [facility] Agency: Amy  Social Determinants of Health (SDOH) Interventions SDOH Screenings   Food Insecurity: No Food Insecurity (12/26/2021)  Housing: Low Risk  (12/26/2021)  Transportation Needs: No Transportation Needs (12/26/2021)  Utilities: Not At Risk (12/26/2021)  Tobacco Use: Low Risk  (07/02/2022)     Readmission Risk Interventions    07/05/2022   12:36 PM 12/27/2021    3:07 PM  Readmission Risk Prevention Plan  Transportation Screening Complete Complete  PCP or Specialist Appt within 3-5 Days Complete Complete  HRI or Home Care Consult Complete Complete  Social Work Consult for Recovery Care Planning/Counseling Complete Complete  Palliative Care Screening Not Applicable Complete  Medication Review Oceanographer) Referral to Pharmacy Complete

## 2022-07-10 NOTE — Discharge Summary (Signed)
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 Hgb 10.5 and dropped to 7.0 the next morning. She became hypotensive. Transfused 4 units so far during this hospitalization, received Kcentra, vitamin K and started on basal pressors and admitted to ICU. CT angio showed extravasation at the junction of descending and sigmoid colon and duodenal enteritis. GI and IR consulted. She was transferred out of ICU on 07/05/2022 and to [facility]  service on 07/06/2022, had 1 further drop of her hemoglobin to 7.7, transfuse another unit, but there was no evidence of recurrent GI bleed, she was seen again by GI , he had multiple bowel movements with no evidence of melena or blood in them, hemoglobin has been stable over the last 48 hours, she will resume her Xarelto on Monday.   Hemorrhagic shock due to acute blood loss caused from lower GI bleed most likely diverticular in the presence of Xarelto use.  she has been seen by PCCM, [facility] along with IR, her Xarelto was reversed with Kcentra and vitamin K, she has received total of 4 units of PRBCs this admission, he was followed closely by GI, there is no concern of active GI bleed at this  point as discussed with GI, given stable hemoglobin for a few days, and she had multiple normal colored BMs with no evidence of melena or bleed, recommendation to resume Reltone on Monday.     History of paroxysmal atrial fibrillation, sick sinus syndrome s/p pacemaker placement.  [place] vas 2 score of greater than 3.  -He was resumed on beta-blockers, lower dose given soft blood pressure, so her Toprol has been decreased at time of discharge -No further evidence of GI bleed, to resume Xarelto on Monday as discussed with GI.   Chronic diastolic CHF.  Currently compensated.   History of valvular heart disease s/p bioprosthetic AVR, MV ring, bioprosthetic TVR.  Supportive care.     COPD, ILD.  Follows with [name].  Not on home oxygen, on chronic prednisone which will be continued, resumed home Plaquenil and resume Ofev upon discharge.  Supportive care with nebulizer treatments.   HX of lupus.  On prednisone, takes Plaquenil at home.  Patient with chronic steroid use, will start on vitamin D and calcium given multiple recent fall and fracture.   CKD stage IIIa.  Creatinine close to baseline monitor.   HX of fall.  Bilateral ankle injury and fractures.  Supposed to wear pro-form boots when she is out of bed, requested to use both foot pro-form boots whenever she is out of bed.   Thrombocytopenia.  Likely due to bleeding.  Improving.    Discharge Diagnoses:  Principal Problem:   GIB (gastrointestinal bleeding) Active Problems:   Essential (primary) hypertension   Hypercoagulability due to atrial fibrillation   Chronic heart failure with preserved ejection fraction  Chronic kidney disease (CKD), active medical management without dialysis, stage 3 (moderate)   Secondary adrenal insufficiency   Hypotension   CHB (complete heart block)   Hemorrhagic shock (HCC)   Acute blood loss anemia    Discharge Instructions   Allergies as of 07/10/2022       Reactions   Other Other (See  Comments)   Patient is to NOT EAT any foods with husks or tree nuts, popcorn   Propofol Shortness Of Breath, Other (See Comments)   "Caused asthma"   Flecainide Other (See Comments)   Reaction not recalled   Amiodarone Other (See Comments)   Delirium/Confusion/Psychosis   Amlodipine Other (See Comments)   "Tired and syncope"        Medication List     STOP taking these medications    metoprolol succinate 25 MG 24 hr tablet Commonly known as: TOPROL-XL       TAKE these medications    acetaminophen 500 MG tablet Commonly known as: TYLENOL Take 1,000 mg by mouth every 6 (six) hours as needed (pain).   albuterol 108 (90 Base) MCG/ACT inhaler Commonly known as: VENTOLIN HFA Inhale 2 puffs into the lungs every 6 (six) hours as needed for wheezing or shortness of breath.   allopurinol 100 MG tablet Commonly known as: ZYLOPRIM TAKE 1 TABLET BY MOUTH EVERY DAY   amoxicillin 500 MG capsule Commonly known as: AMOXIL 4 capsules by mouth 1 hour before dental procedure.   Breztri Aerosphere 160-9-4.8 MCG/ACT Aero Generic drug: Budeson-Glycopyrrol-Formoterol Inhale 2 puffs into the lungs 2 (two) times daily as needed ("for flares").   calcium-vitamin D 500-5 MG-MCG tablet Commonly known as: OSCAL WITH D Take 2 tablets by mouth 2 (two) times daily.   DRY EYE RELIEF DROPS OP Place 1 drop into both eyes in the morning.   famotidine 20 MG tablet Commonly known as: PEPCID Take 20 mg by mouth daily as needed for heartburn or indigestion.   fluticasone 50 MCG/ACT nasal spray Commonly known as: FLONASE PLACE 1 SPRAY INTO BOTH NOSTRILS 2 (TWO) TIMES DAILY   furosemide 40 MG tablet Commonly known as: LASIX TAKE TABLET BY MOUTH IN THE MORNING (SCHEDULED) AND AN ADDITIONAL TABLET AT BEDTIME IF SWELLING IS PRESENT. HOLD IF SYSTOLIC READING IS <110 OR DIASTOLIC READING IS <60.   hydroxychloroquine 200 MG tablet Commonly known as: PLAQUENIL TAKE 1 TABLET BY MOUTH EVERY DAY    Klor-Con M10 10 MEQ tablet Generic drug: potassium chloride TAKE 2 TABLETS BY MOUTH DAILY   levothyroxine 25 MCG tablet Commonly known as: SYNTHROID Take 1 tablet (25 mcg total) by mouth daily.   magnesium oxide 400 MG tablet Commonly known as: MAG-OX Take 1 tablet (400 mg total) by mouth daily.   metoprolol tartrate 25 MG tablet Commonly known as: LOPRESSOR Take 0.5 tablets (12.5 mg total) by mouth 2 (two) times daily.   Ofev 150 MG Caps Generic drug: Nintedanib Take 150 mg by mouth at bedtime.   OLANZapine 2.5 MG tablet Commonly known as: ZYPREXA TAKE 1 TABLET BY MOUTH EVERYDAY AT BEDTIME   pantoprazole 40 MG tablet Commonly known as: PROTONIX Take 1 tablet (40 mg total) by mouth 2 (two) times daily.   predniSONE 5 MG tablet Commonly known as: DELTASONE Take 1 tablet (5 mg total) by mouth daily with breakfast. Patient to double up dose during sick day rule   Rivaroxaban 15 MG Tabs tablet Commonly known as: XARELTO Take 1 tablet (15 mg total) by mouth daily with supper.  Start taking on: July 12, 2022 What changed: These instructions start on July 12, 2022. If you are unsure what to do until then, ask your doctor or other care provider.   Vitamin D3 25 MCG (1000 UT) Caps Take 1,000 Units by mouth daily with lunch.        Follow-up Information     [facility].   Specialty: Home Health Services Why: Someone will call you to schedule first home visit. If you have not received a call after two days of discharging home, call their number listed. If no one comes to assess, call Case Manager at [phone]. Contact information: [address] [place] [address] [phone]                Allergies  Allergen Reactions   Other Other (See Comments)    Patient is to NOT EAT any foods with husks or tree nuts, popcorn   Propofol Shortness Of Breath and Other (See Comments)    "Caused asthma"   Flecainide Other (See Comments)     Reaction not recalled   Amiodarone Other (See Comments)    Delirium/Confusion/Psychosis   Amlodipine Other (See Comments)    "Tired and syncope"    Consultations: GI   Procedures/Studies: CT ANGIO GI BLEED  Result Date: 07/02/2022 CLINICAL DATA:  Mesenteric ischemia. Diarrhea and blood in stool. On Xarelto. Abdominal cramping. EXAM: CTA ABDOMEN AND PELVIS WITHOUT AND WITH CONTRAST TECHNIQUE: Multidetector CT imaging of the abdomen and pelvis was performed using the standard protocol during bolus administration of intravenous contrast. Multiplanar reconstructed images and MIPs were obtained and reviewed to evaluate the vascular anatomy. RADIATION DOSE REDUCTION: This exam was performed according to the departmental dose-optimization program which includes automated exposure control, adjustment of the mA and/or kV according to patient size and/or use of iterative reconstruction technique. CONTRAST:  OMNIPAQUE IOHEXOL 350 MG/ML SOLN COMPARISON:  CT 12/13/2020 FINDINGS: VASCULAR Aorta: Mild aortic atherosclerotic calcification. No aneurysm or dissection. Celiac: Calcified plaque at the origin causes moderate narrowing. No aneurysm or dissection. SMA: Patent without aneurysm or dissection. Renals: Single right and 2 left renal arteries. Patent without aneurysm or dissection. IMA: Patent. Inflow: Patent without aneurysm or dissection. Proximal Outflow: Patent without aneurysm or dissection. Veins: Patent portal vein and IVC. Review of the MIP images confirms the above findings. NON-VASCULAR Lower chest: Cardiomegaly. Cardiac valve prostheses. Interstitial lung disease in the lung bases. Hepatobiliary: Cholelithiasis without evidence of cholecystitis. Unremarkable liver and biliary tree. Pancreas: Unremarkable. Spleen: Unremarkable. Adrenals/Urinary Tract: Stable adrenal glands. No urinary calculi or hydronephrosis. Unremarkable bladder. Stomach/Bowel: Intraluminal hyperdensity on arterial phase images  which diffuses on portal venous phase near the descending colon/sigmoid colon junction (series 6/image 93 and 12/55) compatible with active GI bleed. Mild distention of the rectum and sigmoid colon downstream from the area of active bleeding with heterogenous intraluminal fluid compatible with hemorrhage and stool. Sigmoid colon and descending colon diverticulosis without diverticulitis. The jejunum in the left upper quadrant is distended with fluid and demonstrates wall thickening and hyperenhancement. There is smooth tapering distally without abrupt transition point to suggest obstruction. Findings are suggestive of enteritis. Lymphatic: No lymphadenopathy. Reproductive: Calcified fibroids in the uterus.  No adnexal mass. Other: No free intraperitoneal fluid or air. Musculoskeletal: No acute fracture. Grade 2 anterolisthesis of L4 on L5 with advanced disc space height loss and degenerative endplate changes at L4-L5. Chronic compression deformity of L3 is unchanged. Intermediate density irregular fluid collection in the right posterolateral  gluteal soft tissues (circa series 6/image 128) measuring 3.1 x 1.9 cm is suspicious for hematoma. No evidence of active bleeding. IMPRESSION: 1. Active GI bleed at the descending colon/sigmoid colon junction. This likely represents active diverticular hemorrhage. 2. Enteritis involving the jejunum in the left upper quadrant. 3. Small hematoma in the right gluteal fat without evidence of active bleeding. Aortic Atherosclerosis (ICD10-I70.0). Critical Value/emergent results were called by telephone at the time of interpretation on 07/02/2022 at 2:30 am to provider [facility] , who verbally acknowledged these results. Electronically Signed   By: Minerva Fester M.D.   On: 07/02/2022 02:33   XR Ankle Complete Left  Result Date: 07/01/2022 AP lateral mortise radiographs left ankle reviewed.  Lateral malleolar fracture unchanged with slight displacement but minimal shortening.   Mortise is symmetric.  Some callus formation is present but it is minimal.  CUP PACEART REMOTE DEVICE CHECK  Result Date: 06/22/2022 Scheduled remote reviewed. Normal device function.  Next remote 91 days. LA, CVRS  XR Ankle Complete Left  Result Date: 06/20/2022 AP, oblique, lateral views of the left ankle reviewed.  Ankle mortise well-maintained.  No further displacement at the fracture site.  No callus formation noted yet.  No new fracture or dislocation.  XR Knee 1-2 Views Left  Result Date: 06/20/2022 AP and lateral views of left knee reviewed.  No fracture or dislocation noted.  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  No abnormal patellar height  XR Ankle Complete Left  Result Date: 06/11/2022 AP, oblique, lateral views of left ankle reviewed.  Lateral malleolus fracture noted with ankle mortise overall maintained.  There is no medial malleolus fracture.  No lateral process of the talus fracture or anterior calcaneus process fracture.  No significant abnormality in regards to these amount of tibiofibular overlap or medial clear space widening.  No significant change in position of the fracture compared with radiographs from 5/10.     Subjective:  She denies any complaints today, no significant events overnight, she had BM x 2 yesterday, normal in color with no melena or blood Discharge Exam: Vitals:   07/10/22 0755 07/10/22 0800  BP: 100/67 (!) 99/55  Pulse: 71 69  Resp: (!) 21 20  Temp: 98.5 F (36.9 C)   SpO2: 97% 97%   Vitals:   07/10/22 0717 07/10/22 0750 07/10/22 0755 07/10/22 0800  BP:   100/67 (!) 99/55  Pulse:  68 71 69  Resp:   (!) 21 20  Temp:   98.5 F (36.9 C)   TempSrc:   Oral   SpO2:   97% 97%  Weight: 60.5 kg     Height:        General: Pt is alert, awake, not in acute distress Cardiovascular: RRR, S1/S2 +, no rubs, no gallops Respiratory: CTA bilaterally, no wheezing, no rhonchi Abdominal:  Soft, NT, ND, bowel sounds + Extremities: no edema, no cyanosis    The results of significant diagnostics from this hospitalization (including imaging, microbiology, ancillary and laboratory) are listed below for reference.     Microbiology: Recent Results (from the past 240 hour(s))  MRSA Next Gen by PCR, Nasal     Status: None   Collection Time: 07/02/22  9:05 AM   Specimen: Nasal Mucosa; Nasal Swab  Result Value Ref Range Status   MRSA by PCR Next Gen NOT DETECTED NOT DETECTED Final    Comment: (NOTE) The GeneXpert MRSA Assay (FDA approved for NASAL specimens only), is one component  of a comprehensive MRSA colonization surveillance program. It is not intended to diagnose MRSA infection nor to guide or monitor treatment for MRSA infections. Test performance is not FDA approved in patients less than 41 years old. Performed at [facility], [address] [place], [place] [address]      Labs: BNP (last 3 results) Recent Labs    07/25/21 0101 12/26/21 1350  BNP 479.0* 718.0*   Basic Metabolic Panel: Recent Labs  Lab 07/05/22 0232 07/05/22 2132 07/06/22 0403 07/08/22 0246 07/09/22 0744 07/10/22 0226  NA 133*  --  133* 137 137 137  K 4.0  --  4.3 4.3 3.7 4.0  CL 103  --  105 106 107 107  CO2 21*  --  22 24 20* 21*  GLUCOSE 106*  --  108* 102* 92 98  BUN 18  --  15 24* 29* 30*  CREATININE 1.25*  --  0.99 1.14* 0.90 1.12*  CALCIUM 8.0*  --  8.3* 8.4* 8.7* 8.6*  MG 1.9 2.0  --   --   --  1.9  PHOS 2.8  --  3.3  --   --  3.3   Liver Function Tests: Recent Labs  Lab 07/05/22 0232 07/06/22 0403  AST 56*  --   ALT 43  --   ALKPHOS 105  --   BILITOT 2.1*  --   PROT 5.0*  --   ALBUMIN 2.1* 2.3*   No results for input(s): "LIPASE", "AMYLASE" in the last 168 hours. No results for input(s): "AMMONIA" in the last 168 hours. CBC: Recent Labs  Lab 07/06/22 1234 07/07/22 0208 07/08/22 0246 07/09/22 0744 07/10/22 0226  WBC 8.1 6.2 7.2 7.6 7.1  NEUTROABS   --  4.8  --   --   --   HGB 8.4* 7.7* 9.7* 9.9* 9.0*  HCT 25.8* 23.5* 29.9* 30.7* 28.3*  MCV 90.2 90.7 90.6 94.8 92.2  PLT 103* 105* 124* 135* 145*   Cardiac Enzymes: Recent Labs  Lab 07/05/22 0232  CKTOTAL 63   BNP: Invalid input(s): "POCBNP" CBG: Recent Labs  Lab 07/04/22 1636 07/09/22 1204 07/09/22 1526 07/09/22 2116 07/10/22 0819  GLUCAP 147* 102* 112* 123* 99   D-Dimer No results for input(s): "DDIMER" in the last 72 hours. Hgb A1c No results for input(s): "HGBA1C" in the last 72 hours. Lipid Profile No results for input(s): "CHOL", "HDL", "LDLCALC", "TRIG", "CHOLHDL", "LDLDIRECT" in the last 72 hours. Thyroid function studies No results for input(s): "TSH", "T4TOTAL", "T3FREE", "THYROIDAB" in the last 72 hours.  Invalid input(s): "FREET3" Anemia work up No results for input(s): "VITAMINB12", "FOLATE", "FERRITIN", "TIBC", "IRON", "RETICCTPCT" in the last 72 hours. Urinalysis    Component Value Date/Time   COLORURINE STRAW (A) 07/02/2022 0052   APPEARANCEUR CLEAR 07/02/2022 0052   LABSPEC 1.005 07/02/2022 0052   PHURINE 7.0 07/02/2022 0052   GLUCOSEU NEGATIVE 07/02/2022 0052   HGBUR NEGATIVE 07/02/2022 0052   BILIRUBINUR NEGATIVE 07/02/2022 0052   KETONESUR NEGATIVE 07/02/2022 0052   PROTEINUR NEGATIVE 07/02/2022 0052   NITRITE NEGATIVE 07/02/2022 0052   LEUKOCYTESUR NEGATIVE 07/02/2022 0052   Sepsis Labs Recent Labs  Lab 07/07/22 0208 07/08/22 0246 07/09/22 0744 07/10/22 0226  WBC 6.2 7.2 7.6 7.1   Microbiology Recent Results (from the past 240 hour(s))  MRSA Next Gen by PCR, Nasal     Status: None   Collection Time: 07/02/22  9:05 AM   Specimen: Nasal Mucosa; Nasal Swab  Result Value Ref Range Status  MRSA by PCR Next Gen NOT DETECTED NOT DETECTED Final    Comment: (NOTE) The GeneXpert MRSA Assay (FDA approved for NASAL specimens only), is one component of a comprehensive MRSA colonization surveillance program. It is not intended to  diagnose MRSA infection nor to guide or monitor treatment for MRSA infections. Test performance is not FDA approved in patients less than 54 years old. Performed at [facility], [address], [place], [place] [address]      Time coordinating discharge: Over 30 minutes  SIGNED:   Huey Bienenstock, MD  [facility] 07/10/2022, 9:51 AM Pager   If 7PM-7AM, please contact night-coverage [email or web address]

## 2022-07-10 NOTE — Discharge Instructions (Signed)
Follow with Primary MD Miller, [name], PA in 7 days   Get CBC, CMP,  checked  by Primary MD next visit.    Activity: As tolerated with Full fall precautions use walker/cane & assistance as needed   Disposition Home    Diet: Heart Healthy.   On your next visit with your primary care physician please Get Medicines reviewed and adjusted.   Please request your Prim.MD to go over all Hospital Tests and Procedure/Radiological results at the follow up, please get all Hospital records sent to your Prim MD by signing hospital release before you go home.   If you experience worsening of your admission symptoms, develop shortness of breath, life threatening emergency, suicidal or homicidal thoughts you must seek medical attention immediately by calling 911 or calling your MD immediately  if symptoms less severe.  You Must read complete instructions/literature along with all the possible adverse reactions/side effects for all the Medicines you take and that have been prescribed to you. Take any new Medicines after you have completely understood and accpet all the possible adverse reactions/side effects.   Do not drive, operating heavy machinery, perform activities at heights, swimming or participation in water activities or provide baby sitting services if your were admitted for syncope or siezures until you have seen by Primary MD or a Neurologist and advised to do so again.  Do not drive when taking Pain medications.    Do not take more than prescribed Pain, Sleep and Anxiety Medications  Special Instructions: If you have smoked or chewed Tobacco  in the last 2 yrs please stop smoking, stop any regular Alcohol  and or any Recreational drug use.  Wear Seat belts while driving.   Please note  You were cared for by a hospitalist during your hospital stay. If you have any questions about your discharge medications or the care you received while you were in the hospital after you are  discharged, you can call the unit and asked to speak with the hospitalist on call if the hospitalist that took care of you is not available. Once you are discharged, your primary care physician will handle any further medical issues. Please note that NO REFILLS for any discharge medications will be authorized once you are discharged, as it is imperative that you return to your primary care physician (or establish a relationship with a primary care physician if you do not have one) for your aftercare needs so that they can reassess your need for medications and monitor your lab values.  

## 2022-07-12 ENCOUNTER — Other Ambulatory Visit (HOSPITAL_COMMUNITY): Payer: Self-pay | Admitting: Internal Medicine

## 2022-07-12 ENCOUNTER — Other Ambulatory Visit (HOSPITAL_COMMUNITY): Payer: Self-pay

## 2022-07-13 ENCOUNTER — Encounter: Payer: Self-pay | Admitting: Pulmonary Disease

## 2022-07-13 ENCOUNTER — Ambulatory Visit: Payer: Medicare HMO | Attending: Physician Assistant | Admitting: Pulmonary Disease

## 2022-07-13 VITALS — BP 128/64 | HR 71 | Ht 66.0 in | Wt 133.0 lb

## 2022-07-13 DIAGNOSIS — Z954 Presence of other heart-valve replacement: Secondary | ICD-10-CM | POA: Diagnosis not present

## 2022-07-13 DIAGNOSIS — I10 Essential (primary) hypertension: Secondary | ICD-10-CM

## 2022-07-13 DIAGNOSIS — Z9889 Other specified postprocedural states: Secondary | ICD-10-CM

## 2022-07-13 DIAGNOSIS — Z95810 Presence of automatic (implantable) cardiac defibrillator: Secondary | ICD-10-CM

## 2022-07-13 DIAGNOSIS — Z952 Presence of prosthetic heart valve: Secondary | ICD-10-CM

## 2022-07-13 DIAGNOSIS — I4821 Permanent atrial fibrillation: Secondary | ICD-10-CM

## 2022-07-13 DIAGNOSIS — I2721 Secondary pulmonary arterial hypertension: Secondary | ICD-10-CM

## 2022-07-13 DIAGNOSIS — I5032 Chronic diastolic (congestive) heart failure: Secondary | ICD-10-CM | POA: Diagnosis not present

## 2022-07-13 DIAGNOSIS — I472 Ventricular tachycardia, unspecified: Secondary | ICD-10-CM

## 2022-07-13 LAB — CUP PACEART INCLINIC DEVICE CHECK
Battery Remaining Longevity: 49 mo
Battery Voltage: 2.97 V
Brady Statistic RV Percent Paced: 99.71 %
Date Time Interrogation Session: 20240618172759
HighPow Impedance: 52 Ohm
Implantable Lead Connection Status: 753985
Implantable Lead Implant Date: 20120920
Implantable Lead Location: 753862
Implantable Pulse Generator Implant Date: 20190809
Lead Channel Impedance Value: 247 Ohm
Lead Channel Impedance Value: 304 Ohm
Lead Channel Pacing Threshold Amplitude: 0.5 V
Lead Channel Pacing Threshold Pulse Width: 0.4 ms
Lead Channel Sensing Intrinsic Amplitude: 11.625 mV
Lead Channel Sensing Intrinsic Amplitude: 11.625 mV
Lead Channel Setting Pacing Amplitude: 1.25 V
Lead Channel Setting Pacing Pulse Width: 0.4 ms
Lead Channel Setting Sensing Sensitivity: 0.3 mV
Zone Setting Status: 755011
Zone Setting Status: 755011

## 2022-07-13 NOTE — Patient Instructions (Signed)
Medication Instructions:   Your physician recommends that you continue on your current medications as directed. Please refer to the Current Medication list given to you today.   *If you need a refill on your cardiac medications before your next appointment, please call your pharmacy*   Lab Work: NONE ORDERED  TODAY    If you have labs (blood work) drawn today and your tests are completely normal, you will receive your results only by: MyChart Message (if you have MyChart) OR A paper copy in the mail If you have any lab test that is abnormal or we need to change your treatment, we will call you to review the results.   Testing/Procedures: NONE ORDERED  TODAY    Follow-Up: At [facility], you and your health needs are our priority.  As part of our continuing mission to provide you with exceptional heart care, we have created designated Provider Care Teams.  These Care Teams include your primary Cardiologist (physician) and Advanced Practice Providers (APPs -  Physician Assistants and Nurse Practitioners) who all work together to provide you with the care you need, when you need it.  We recommend signing up for the patient portal called "MyChart".  Sign up information is provided on this After Visit Summary.  MyChart is used to connect with patients for Virtual Visits (Telemedicine).  Patients are able to view lab/test results, encounter notes, upcoming appointments, etc.  Non-urgent messages can be sent to your provider as well.   To learn more about what you can do with MyChart, go to [email or web address].    Your next appointment:    6 -8 week(s)  Provider:    You may see Lanier Prude, MD or one of the following Advanced Practice Providers on your designated Care Team:   Francis Dowse, [place] "[facility] [place], [place]

## 2022-07-13 NOTE — Progress Notes (Signed)
Cardiology Office Note:  .   Date:  07/13/2022  ID:  Lindsey Huber, DOB 22-Sep-1945, MRN [id number] PCP: Collene Mares, PA  [facility] Providers Cardiologist:  None Electrophysiologist:  Lanier Prude, MD     History of Present Illness: .   Lindsey Huber is a 77 y.o. female with PMH of hypothyroidism, CKD 3, GERD, mild Lewy body dementia, IPF, RA, SLE on chronic prednisone, TIA, IDA, HLD, HTN, NSVT, AF, drug induced torsade de pointes (in setting of flecainide), VHD (s/p AVR/MVR), HFpEF, CHB s/p ICD.  She was last seen in EP clinic in 12/2021 and was stable. She had had recent falls and with a large hematoma post fall that required PRBC.  That hospital course was complicated by decompensated CHF.  At time of clinic visit, she had been cleared to restart anticoagulation.  Recently admitted to hospital from 6/6-6/15 in the setting of hemorrhagic shock due to lower diverticular GIB. Required 4 units PRBC, KCentra & Vitamin K.   Returns to clinic 6/18 and feels well after discharge. She notes significant LE swelling. She was seen by her PCP 6/18 am and was instructed to increase her lasix to BID dosing (per pt report).  She denies significant shortness of breath, chest pain, pain with inspiration, recent falls, dizziness, lightheadedness. Her LLE is in a boot after a fracture.  She is tolerating her medications without difficulty. Granddaughter, [name], reports the patient is very active at home.   ROS: Negative unless otherwise mentioned in HPI.   EP Information / Studies Reviewed: .    Studies 11/2020 R/LHC > normal coronary arteries, LVEF 60-65%, very mild PAH, normal left-sided filling pressures with no v-waves in PCWP tracing to suggest hemodynamically significant MR, no significant stenosis AoV, MV, or TV 02/2022 ECHO > LVEF 60-65%, mild concentric LV hypertrophy, bioprosthetic AVR, MVR, RVSP 52.9, LA/RA severely dilated    Arrhythmia / Device  Medtronic CareLink ICD    05/2006 original device implant, SSS, atrial standstill, advanced Wenckebach, VT/VF arrest (torsades), mixed connective tissue disease  09/2010 MDT single chamber ICD 08/2017 Gen change, possible damage to lead during valvular surgery  CXR shows abandoned RA lead, & broken/abandoned dual coil RV lead 05/2022 PaceART > normal device function / lead parameters stable. 06/2022 Device Interrogation > normal device function, battery/lead parameters stable, no VT. OptiVol elevated (post hospital). No R waves at 40  AAD / Anticoagulation  01/2020 Established with Dr. Lalla Brothers after move from [facility] to Flecainide > torsades  Xarelto 15mg     Risk Assessment/Calculations:    CHA2DS2-VASc Score = 6   This indicates a 9.7% annual risk of stroke. The patient's score is based upon: CHF History: 1 HTN History: 1 Diabetes History: 0 Stroke History: 0 Vascular Disease History: 1 Age Score: 2 Gender Score: 1            Physical Exam:   VS:  BP 128/64   Pulse 71   Ht 5\' 6"  (1.676 m)   Wt 133 lb (60.3 kg)   SpO2 98%   BMI 21.47 kg/m    Wt Readings from Last 3 Encounters:  07/13/22 133 lb (60.3 kg)  07/10/22 133 lb 6.1 oz (60.5 kg)  06/25/22 123 lb (55.8 kg)    GEN: Well nourished, well developed in no acute distress NECK: No JVD; No carotid bruits CARDIAC: S1S2 RRR, no murmurs, rubs, gallops, pacemaker site without erythema, edema, or tethering  RESPIRATORY:  Clear to auscultation without rales, wheezing or rhonchi  ABDOMEN: Soft, non-tender, non-distended EXTREMITIES:  No edema; No deformity   ASSESSMENT AND PLAN: .    SSS/CHB s/p ICD  VT/Torsade de Pointes Medtronic Carelink ICD  -device interrogated in clinic, normal function   Permanent AF / NSVT CHA2DSVASC of 6.  No R waves at 40.  -on Xarelto, reduced dose for renal disease  -high risk falls but given her multiple valve replacement / repairs, warrants AC -continue lopressor   Chronic HFpEF  -volume status   -OptiVol elevated on device interrogation, exam consistent as well. PCP has increased lasix to BID dosing for one week per patient with labs 6/18, defer to PCP   Valvular Heart Disease  S/p bioprosthetic AVR, excision of subaortic membrane, mitral valve annuloplasty, TV repair followed by replacement   PH IPF in the setting of Connective Tissue Disease -follows with advanced HF, pulmonary  HTN  -BP stable         Dispo: Follow up with PCP regarding renal function post diuresis. With EP in 6-8 weeks.    Signed, Canary Brim, MSN, APRN, NP-C, AGACNP-BC [facility] - Electrophysiology  07/13/2022, 4:55 PM

## 2022-07-14 ENCOUNTER — Other Ambulatory Visit (INDEPENDENT_AMBULATORY_CARE_PROVIDER_SITE_OTHER): Payer: Medicare HMO

## 2022-07-14 ENCOUNTER — Ambulatory Visit (INDEPENDENT_AMBULATORY_CARE_PROVIDER_SITE_OTHER): Payer: Medicare HMO | Admitting: Surgical

## 2022-07-14 DIAGNOSIS — S8262XA Displaced fracture of lateral malleolus of left fibula, initial encounter for closed fracture: Secondary | ICD-10-CM

## 2022-07-14 NOTE — Progress Notes (Signed)
Remote ICD transmission.   

## 2022-07-14 NOTE — Addendum Note (Signed)
Addended by: Geralyn Flash D on: 07/14/2022 03:35 PM   Modules accepted: Level of Service

## 2022-07-15 ENCOUNTER — Encounter: Payer: Self-pay | Admitting: Surgical

## 2022-07-15 NOTE — Progress Notes (Signed)
Post-Op Visit Note   Patient: Lindsey Huber           Date of Birth: 1945/04/16           MRN: [id number] Visit Date: 07/14/2022 PCP: Collene Mares, PA   Assessment & Plan:  Chief Complaint:  Chief Complaint  Patient presents with   Left Ankle - Pain   Visit Diagnoses:  1. Closed fracture of distal lateral malleolus of left fibula, initial encounter     Plan: Patient is a 77 year old female who returns for reevaluation of left ankle lateral malleolus fracture.  She has been full weightbearing in fracture boot.  Date of injury was 06/02/2022.  She has been working on ankle range of motion coming out several times throughout the day from the fracture boot to do ankle pumps.  Since she has been last seen by Dr. August Saucer, she has had a GI bleed with anemia that required hospitalization.  She also has been experiencing a lot of lower extremity edema in both legs that she has seen her primary provider for and is treating with Lasix.  On exam, patient has pitting edema throughout bilateral lower extremities.  She has no tenderness over the medial or lateral malleoli.  She has about 7 degrees dorsiflexion of the left ankle with the knee flexed.  No calf tenderness.  Negative Homans' sign.  Intact ankle dorsiflexion, plantarflexion, inversion, eversion.  Today's radiographs demonstrate no displacement at the fracture site since she has been weightbearing.  Will plan on continuing with weightbearing in fracture boot for 3 weeks and then returning 3 weeks for clinical recheck.  New radiographs at that time and likely discontinuation of the fracture boot.  Follow-Up Instructions: No follow-ups on file.   Orders:  Orders Placed This Encounter  Procedures   XR Ankle Complete Left   No orders of the defined types were placed in this encounter.   Imaging: No results found.  PMFS History: Patient Active Problem List   Diagnosis Date Noted   Acute blood loss anemia 07/04/2022   GIB  (gastrointestinal bleeding) 07/02/2022   Hemorrhagic shock (HCC) 07/02/2022   Symptomatic anemia 12/26/2021   Mild dementia 08/12/2021   Allergic rhinitis    Chronic gouty arthritis    Epistaxis    Hypoglycemia    Iron deficiency anemia    Long term (current) use of anticoagulants    Malnutrition of mild degree [name]: 75% to less than 90% of standard weight)    Oropharyngeal dysphagia    Osteopenia of neck of left femur    Raynaud's disease    Rheumatoid arthritis    Transient ischemic attack    Slow transit constipation    Thrombophilia    Achalasia    Delusional thoughts    Recurrent falls 04/09/2021   Hypotension 12/14/2020   Hypokalemia 12/14/2020   Hypocalcemia 12/14/2020   Hypomagnesemia 12/14/2020   Non-toxic multinodular goiter 08/20/2020   Acquired hypothyroidism 08/20/2020   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   Vitamin D deficiency 05/01/2020   Proteinuria 05/01/2020   Osteoarthritis of knee 05/01/2020   Osteoarthritis of hip 05/01/2020   Gastroesophageal reflux disease 05/01/2020   Idiopathic pulmonary fibrosis 05/01/2020   Cardiomyopathy 05/01/2020   Hypercoagulability due to atrial fibrillation 05/01/2020   Systemic lupus erythematosus 05/01/2020   Chronic heart failure with preserved ejection fraction 05/01/2020   Chronic kidney disease (CKD), active medical management without dialysis, stage 3 (moderate) 05/01/2020   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   Non-rheumatic  atrial fibrillation 05/01/2020   Atherosclerosis of abdominal aorta 05/01/2020   Diverticulosis of colon 05/01/2020   Cholelithiasis without obstruction 05/01/2020   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   AICD (automatic cardioverter/defibrillator) present 11/14/2019   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 11/14/2019   Mitral and aortic incompetence 11/14/2019   Hyperlipidemia 11/14/2019   Pain of left hip joint  12/12/2017   Essential (primary) hypertension 08/18/2017   CHB (complete heart block) 08/18/2017   NSVT (nonsustained ventricular tachycardia) 08/18/2017   Aortic valve disorder 03/26/2002   Past Medical History:  Diagnosis Date   Achalasia    Acquired hypothyroidism 08/20/2020   Acute metabolic encephalopathy 07/25/2021   AICD (automatic cardioverter/defibrillator) present 11/14/2019   Allergic rhinitis    Ankle fracture 03/25/2022   Aortic valve disorder 03/26/2002   Atherosclerosis of abdominal aorta (HCC) 05/01/2020   Cardiomyopathy (HCC)    CHB (complete heart block) (HCC) 08/18/2017   CHF (congestive heart failure) (HCC)    Cholelithiasis without obstruction 05/01/2020   Chronic gouty arthritis    Chronic heart failure with preserved ejection fraction (HCC) 05/01/2020   Chronic kidney disease (CKD), active medical management without dialysis, stage 3 (moderate) (HCC) 05/01/2020   Closed torus fracture of distal end of right radius with delayed healing 08/12/2021   Delusional thoughts (HCC)    Diverticulosis of colon 05/01/2020   Epistaxis    Essential (primary) hypertension 08/18/2017   Gastroesophageal reflux disease 05/01/2020   Gastrointestinal hemorrhage    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 11/14/2019   Hypercoagulability due to atrial fibrillation (HCC) 05/01/2020   Hyperlipidemia 11/14/2019   Hypocalcemia 12/14/2020   Hypoglycemia    Hypokalemia 12/14/2020   Hypomagnesemia 12/14/2020   Hypotension 12/14/2020   Idiopathic pulmonary fibrosis (HCC) 05/01/2020   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   Iron deficiency anemia    Long term (current) use of anticoagulants    Malnutrition of mild degree [name]: 75% to less than 90% of standard weight) (HCC)    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/12/2021   Mitral and aortic incompetence 11/14/2019   Non-rheumatic atrial fibrillation (HCC) 05/01/2020   Non-toxic multinodular goiter 08/20/2020   NSVT  (nonsustained ventricular tachycardia) (HCC) 08/18/2017   Oropharyngeal dysphagia    Osteoarthritis of hip 05/01/2020   Osteoarthritis of knee 05/01/2020   Osteopenia of neck of left femur    Pain of left hip joint 12/12/2017   Proteinuria 05/01/2020   Raynaud's disease    Recurrent falls 04/09/2021   Rheumatoid arthritis (HCC)    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   Septic shock (HCC) 03/10/2020   Slow transit constipation    Systemic lupus erythematosus (HCC) 05/01/2020   Thrombophilia (HCC)    Transient ischemic attack    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   Vitamin D deficiency     Family History  Problem Relation Age of Onset   Heart disease Mother    Hypertension Mother    Rheum arthritis Mother    Osteoarthritis Mother    Heart disease Father    Hypertension Father    Osteoarthritis Father    Heart disease Sister    Diabetes Brother    Cancer Brother     Past Surgical History:  Procedure Laterality Date   BREAST BIOPSY Left    CARDIAC VALVE SURGERY     CARPAL TUNNEL RELEASE     COLONOSCOPY WITH PROPOFOL N/A 12/20/2020   Procedure: COLONOSCOPY WITH PROPOFOL;  Surgeon: Willis Modena,  MD;  Location: [facility] ENDOSCOPY;  Service: Endoscopy;  Laterality: N/A;   HEMORRHOID SURGERY     PACEMAKER INSERTION     RIGHT/LEFT HEART CATH AND CORONARY ANGIOGRAPHY N/A 11/27/2020   Procedure: RIGHT/LEFT HEART CATH AND CORONARY ANGIOGRAPHY;  Surgeon: Dolores Patty, MD;  Location: [facility] INVASIVE CV LAB;  Service: Cardiovascular;  Laterality: N/A;   TONSILLECTOMY     Social History   Occupational History   Occupation: Retired    Comment: Community education officer business  Tobacco Use   Smoking status: Never    Passive exposure: Past   Smokeless tobacco: Never  Vaping Use   Vaping Use: Never used  Substance and Sexual Activity   Alcohol use: Never   Drug use: Never   Sexual activity: Not Currently

## 2022-07-21 ENCOUNTER — Other Ambulatory Visit (HOSPITAL_COMMUNITY): Payer: Self-pay | Admitting: Internal Medicine

## 2022-07-21 ENCOUNTER — Ambulatory Visit (HOSPITAL_COMMUNITY)
Admission: RE | Admit: 2022-07-21 | Discharge: 2022-07-21 | Disposition: A | Discharge status: Home / Self Care | Payer: Medicare HMO | Source: Ambulatory Visit | Attending: Internal Medicine | Admitting: Internal Medicine

## 2022-07-21 DIAGNOSIS — R609 Edema, unspecified: Secondary | ICD-10-CM | POA: Insufficient documentation

## 2022-07-21 DIAGNOSIS — R600 Localized edema: Secondary | ICD-10-CM | POA: Diagnosis present

## 2022-07-21 DIAGNOSIS — M7989 Other specified soft tissue disorders: Secondary | ICD-10-CM | POA: Insufficient documentation

## 2022-07-23 ENCOUNTER — Telehealth: Payer: Self-pay | Admitting: Orthopedic Surgery

## 2022-07-23 NOTE — Telephone Encounter (Signed)
Pls advise.  

## 2022-07-23 NOTE — Telephone Encounter (Signed)
IC LMVM 

## 2022-07-23 NOTE — Telephone Encounter (Signed)
Received call from Charri-(PT) with [facility] advised patient PCP ordered HHPT for the patient.   Charri asked if there are any weight bearing precautions and is she  allowed to do exercises on the foot?  The number to contact Emiliano Dyer is [phone]

## 2022-07-23 NOTE — Telephone Encounter (Signed)
Okay for weightbearing as tolerated in fracture boot.  Okay for ankle range of motion exercises.

## 2022-07-30 ENCOUNTER — Telehealth: Payer: Self-pay | Admitting: Orthopedic Surgery

## 2022-07-30 ENCOUNTER — Telehealth (HOSPITAL_COMMUNITY): Payer: Self-pay | Admitting: Cardiology

## 2022-07-30 DIAGNOSIS — I5032 Chronic diastolic (congestive) heart failure: Secondary | ICD-10-CM

## 2022-07-30 MED ORDER — FUROSEMIDE 40 MG PO TABS: 40.0000 mg | ORAL_TABLET | Freq: Every day | ORAL | 3 refills | Status: DC

## 2022-07-30 NOTE — Telephone Encounter (Signed)
Daughter called to get diuretic dose clarified   Reports she has made multiple changes over the past 2-3 weeks and unsure how much she needs  5/31 OV Lasix 40 every other day 6/18 OV Lasix  40 BID per PCP and elevated optivol advised to continue    Currently taking 40 daily Weight 7/1 114.8 Weight 7/5 112.4  Denied swelling SOB or weight increase  -transmission sent to recheck optivol    Please advise

## 2022-07-30 NOTE — Telephone Encounter (Signed)
Charrise from [facility] would like to know if there are any new Weight baring instructions or exercise instructions for the pt please advise CB [phone]

## 2022-07-30 NOTE — Telephone Encounter (Signed)
Normal remote transmission. No episodes noted. Appears optivol has returned to normal.

## 2022-07-30 NOTE — Telephone Encounter (Signed)
Daughter Marcelino Duster aware Med list updated  Follow up labs 7/16 345

## 2022-07-30 NOTE — Telephone Encounter (Signed)
Per [facility] note, pt is wb in boot. Lvm advising

## 2022-08-04 ENCOUNTER — Ambulatory Visit: Payer: Medicare HMO | Admitting: Orthopedic Surgery

## 2022-08-04 ENCOUNTER — Other Ambulatory Visit (INDEPENDENT_AMBULATORY_CARE_PROVIDER_SITE_OTHER): Payer: Medicare HMO

## 2022-08-04 ENCOUNTER — Encounter: Payer: Self-pay | Admitting: Orthopedic Surgery

## 2022-08-04 DIAGNOSIS — S8262XA Displaced fracture of lateral malleolus of left fibula, initial encounter for closed fracture: Secondary | ICD-10-CM

## 2022-08-04 NOTE — Progress Notes (Signed)
Office Visit Note   Patient: Lindsey Huber           Date of Birth: [birth year]           MRN: [id number] Visit Date: 08/04/2022 Requested by: Collene Mares, PA [address] [place],  [place] [address] PCP: Hyacinth Meeker, [place], [name]  Subjective: Chief Complaint  Patient presents with   Left Ankle - Follow-up    HPI: Lindsey Huber is a 77 y.o. female who presents to the office reporting right calf pain as well as follow-up for left fibular fracture.  Patient has been weightbearing as tolerated in the fracture boot.  She is about 9 weeks out from that left fibula fracture.  She also reports a right calf mass of about 3 weeks duration which is getting bigger and smaller in size.  Denies any trauma or drainage from this small mass.  It is painful to touch at times..                ROS: All systems reviewed are negative as they relate to the chief complaint within the history of present illness.  Patient denies fevers or chills.  Assessment & Plan: Visit Diagnoses:  1. Closed fracture of distal lateral malleolus of left fibula, initial encounter     Plan: Impression is left fibula fracture which looks well-healed on plain radiographs.  Right calf mass which is really too distal for Baker's cyst.  Ultrasound examination does demonstrate cystic fluid collection just above the fascia.  Does not have a solid appearance.  This is something we should follow-up in 4 weeks for clinical recheck.  Likely related to her Xarelto use.  Okay to be weightbearing as tolerated without the fracture boot on the left ankle.  Follow-Up Instructions: No follow-ups on file.   Orders:  Orders Placed This Encounter  Procedures   XR Ankle Complete Left   No orders of the defined types were placed in this encounter.     Procedures: No procedures performed   Clinical Data: No additional findings.  Objective: Vital Signs: There were no vitals taken for this visit.  Physical Exam:   Constitutional: Patient appears well-developed HEENT:  Head: Normocephalic Eyes:EOM are normal Neck: Normal range of motion Cardiovascular: Normal rate Pulmonary/chest: Effort normal Neurologic: Patient is alert Skin: Skin is warm Psychiatric: Patient has normal mood and affect  Ortho Exam: Ortho exam demonstrates about a 3 x 3 cm mass in the midportion of the medial calf.  Is above the fascia.  Slightly tender to palpation but negative Tinel's.  Foot is perfused.  Compartments are soft.  No bruising or ecchymosis or induration around this mobile mass.  Specialty Comments:  No specialty comments available.  Imaging: XR Ankle Complete Left  Result Date: 08/04/2022 AP lateral mortise radiographs left ankle reviewed.  Left lateral malleolus fracture has healed in good position alignment.  Mortise is symmetric.    PMFS History: Patient Active Problem List   Diagnosis Date Noted   Acute blood loss anemia 07/04/2022   GIB (gastrointestinal bleeding) 07/02/2022   Hemorrhagic shock (HCC) 07/02/2022   Symptomatic anemia 12/26/2021   Mild dementia 08/12/2021   Allergic rhinitis    Chronic gouty arthritis    Epistaxis    Hypoglycemia    Iron deficiency anemia    Long term (current) use of anticoagulants    Malnutrition of mild degree [name]: 75% to less than 90% of standard weight)    Oropharyngeal dysphagia  Osteopenia of neck of left femur    Raynaud's disease    Rheumatoid arthritis    Transient ischemic attack    Slow transit constipation    Thrombophilia    Achalasia    Delusional thoughts    Recurrent falls 04/09/2021   Hypotension 12/14/2020   Hypokalemia 12/14/2020   Hypocalcemia 12/14/2020   Hypomagnesemia 12/14/2020   Non-toxic multinodular goiter 08/20/2020   Acquired hypothyroidism 08/20/2020   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   Vitamin D deficiency 05/01/2020   Proteinuria 05/01/2020   Osteoarthritis of knee 05/01/2020   Osteoarthritis of hip  05/01/2020   Gastroesophageal reflux disease 05/01/2020   Idiopathic pulmonary fibrosis 05/01/2020   Cardiomyopathy 05/01/2020   Hypercoagulability due to atrial fibrillation 05/01/2020   Systemic lupus erythematosus 05/01/2020   Chronic heart failure with preserved ejection fraction 05/01/2020   Chronic kidney disease (CKD), active medical management without dialysis, stage 3 (moderate) 05/01/2020   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   Non-rheumatic atrial fibrillation 05/01/2020   Atherosclerosis of abdominal aorta 05/01/2020   Diverticulosis of colon 05/01/2020   Cholelithiasis without obstruction 05/01/2020   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   AICD (automatic cardioverter/defibrillator) present 11/14/2019   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 11/14/2019   Mitral and aortic incompetence 11/14/2019   Hyperlipidemia 11/14/2019   Pain of left hip joint 12/12/2017   Essential (primary) hypertension 08/18/2017   CHB (complete heart block) 08/18/2017   NSVT (nonsustained ventricular tachycardia) 08/18/2017   Aortic valve disorder 03/26/2002   Past Medical History:  Diagnosis Date   Achalasia    Acquired hypothyroidism 08/20/2020   Acute metabolic encephalopathy 07/25/2021   AICD (automatic cardioverter/defibrillator) present 11/14/2019   Allergic rhinitis    Ankle fracture 03/25/2022   Aortic valve disorder 03/26/2002   Atherosclerosis of abdominal aorta (HCC) 05/01/2020   Cardiomyopathy (HCC)    CHB (complete heart block) (HCC) 08/18/2017   CHF (congestive heart failure) (HCC)    Cholelithiasis without obstruction 05/01/2020   Chronic gouty arthritis    Chronic heart failure with preserved ejection fraction (HCC) 05/01/2020   Chronic kidney disease (CKD), active medical management without dialysis, stage 3 (moderate) (HCC) 05/01/2020   Closed torus fracture of distal end of right radius with delayed  healing 08/12/2021   Delusional thoughts (HCC)    Diverticulosis of colon 05/01/2020   Epistaxis    Essential (primary) hypertension 08/18/2017   Gastroesophageal reflux disease 05/01/2020   Gastrointestinal hemorrhage    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 11/14/2019   Hypercoagulability due to atrial fibrillation (HCC) 05/01/2020   Hyperlipidemia 11/14/2019   Hypocalcemia 12/14/2020   Hypoglycemia    Hypokalemia 12/14/2020   Hypomagnesemia 12/14/2020   Hypotension 12/14/2020   Idiopathic pulmonary fibrosis (HCC) 05/01/2020   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   Iron deficiency anemia    Long term (current) use of anticoagulants    Malnutrition of mild degree [name]: 75% to less than 90% of standard weight) (HCC)    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/12/2021   Mitral and aortic incompetence 11/14/2019   Non-rheumatic atrial fibrillation (HCC) 05/01/2020   Non-toxic multinodular goiter 08/20/2020   NSVT (nonsustained ventricular tachycardia) (HCC) 08/18/2017   Oropharyngeal dysphagia    Osteoarthritis of hip 05/01/2020   Osteoarthritis of knee 05/01/2020   Osteopenia of neck of left femur    Pain of left hip joint 12/12/2017   Proteinuria 05/01/2020   Raynaud's disease    Recurrent falls 04/09/2021   Rheumatoid arthritis (  HCC)    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   Septic shock (HCC) 03/10/2020   Slow transit constipation    Systemic lupus erythematosus (HCC) 05/01/2020   Thrombophilia (HCC)    Transient ischemic attack    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   Vitamin D deficiency     Family History  Problem Relation Age of Onset   Heart disease Mother    Hypertension Mother    Rheum arthritis Mother    Osteoarthritis Mother    Heart disease Father    Hypertension Father    Osteoarthritis Father    Heart disease Sister    Diabetes Brother    Cancer Brother     Past Surgical History:   Procedure Laterality Date   BREAST BIOPSY Left    CARDIAC VALVE SURGERY     CARPAL TUNNEL RELEASE     COLONOSCOPY WITH PROPOFOL N/A 12/20/2020   Procedure: COLONOSCOPY WITH PROPOFOL;  Surgeon: Willis Modena, MD;  [facility] ENDOSCOPY;  Service: Endoscopy;  Laterality: N/A;   HEMORRHOID SURGERY     PACEMAKER INSERTION     RIGHT/LEFT HEART CATH AND CORONARY ANGIOGRAPHY N/A 11/27/2020   Procedure: RIGHT/LEFT HEART CATH AND CORONARY ANGIOGRAPHY;  Surgeon: Dolores Patty, MD;  Location: [facility] INVASIVE CV LAB;  Service: Cardiovascular;  Laterality: N/A;   TONSILLECTOMY     Social History   Occupational History   Occupation: Retired    Comment: Community education officer business  Tobacco Use   Smoking status: Never    Passive exposure: Past   Smokeless tobacco: Never  Vaping Use   Vaping Use: Never used  Substance and Sexual Activity   Alcohol use: Never   Drug use: Never   Sexual activity: Not Currently

## 2022-08-10 ENCOUNTER — Telehealth: Payer: Self-pay | Admitting: Orthopedic Surgery

## 2022-08-10 NOTE — Telephone Encounter (Signed)
[facility] nurse called in asking if there was and exercises he wanted her to work on or a goal since she got her boot at the last visit, also are there any precautions or limitations they should know about please advise fax information [phone]

## 2022-08-12 ENCOUNTER — Telehealth: Payer: Self-pay | Admitting: Surgical

## 2022-08-12 NOTE — Telephone Encounter (Signed)
2nd Notice from [facility] nurse would like to know if they need to do anything with pt after getting her boot from prev. message

## 2022-08-13 ENCOUNTER — Ambulatory Visit (HOSPITAL_COMMUNITY)
Admission: RE | Admit: 2022-08-13 | Discharge: 2022-08-13 | Disposition: A | Discharge status: Home / Self Care | Payer: Medicare HMO | Source: Ambulatory Visit | Attending: Cardiology | Admitting: Cardiology

## 2022-08-13 DIAGNOSIS — I5032 Chronic diastolic (congestive) heart failure: Secondary | ICD-10-CM | POA: Insufficient documentation

## 2022-08-13 LAB — BASIC METABOLIC PANEL
Anion gap: 13 (ref 5–15)
BUN: 33 mg/dL — ABNORMAL HIGH (ref 8–23)
CO2: 24 mmol/L (ref 22–32)
Calcium: 9.5 mg/dL (ref 8.9–10.3)
Chloride: 101 mmol/L (ref 98–111)
Creatinine, Ser: 1.41 mg/dL — ABNORMAL HIGH (ref 0.44–1.00)
GFR, Estimated: 39 mL/min — ABNORMAL LOW (ref >60)
Glucose, Bld: 79 mg/dL (ref 70–99)
Potassium: 3.5 mmol/L (ref 3.5–5.1)
Sodium: 138 mmol/L (ref 135–145)

## 2022-08-13 NOTE — Telephone Encounter (Signed)
Okay for gait training, ankle ROM, proprioception exercises

## 2022-08-13 NOTE — Telephone Encounter (Signed)
Done

## 2022-08-16 ENCOUNTER — Telehealth (HOSPITAL_COMMUNITY): Payer: Self-pay

## 2022-08-16 DIAGNOSIS — I5032 Chronic diastolic (congestive) heart failure: Secondary | ICD-10-CM

## 2022-08-16 NOTE — Telephone Encounter (Signed)
-----   Message from Jacklynn Ganong sent at 08/13/2022  4:57 PM EDT ----- Kidney function mildly elevated from baseline. Repeat BMET in 2 weeks to follow

## 2022-08-16 NOTE — Telephone Encounter (Signed)
Patients daughter advised and verbalized understanding,lab appointment scheduled,lab orders entered.  Orders Placed This Encounter  Procedures   Basic metabolic panel    Standing Status:   Future    Standing Expiration Date:   08/16/2023    Order Specific Question:   Release to patient    Answer:   Immediate    Order Specific Question:   Release to patient    Answer:   Immediate [1]

## 2022-08-17 ENCOUNTER — Ambulatory Visit (INDEPENDENT_AMBULATORY_CARE_PROVIDER_SITE_OTHER): Payer: Medicare HMO

## 2022-08-17 DIAGNOSIS — I429 Cardiomyopathy, unspecified: Secondary | ICD-10-CM

## 2022-08-18 ENCOUNTER — Other Ambulatory Visit: Payer: Self-pay | Admitting: Physician Assistant

## 2022-08-18 ENCOUNTER — Other Ambulatory Visit: Payer: Self-pay | Admitting: Internal Medicine

## 2022-08-18 ENCOUNTER — Telehealth: Payer: Self-pay

## 2022-08-18 LAB — CUP PACEART REMOTE DEVICE CHECK
Battery Remaining Longevity: 43 mo
Battery Voltage: 2.97 V
Brady Statistic RV Percent Paced: 99.76 %
Date Time Interrogation Session: 20240723001703
HighPow Impedance: 65 Ohm
Implantable Lead Connection Status: 753985
Implantable Lead Implant Date: 20120920
Implantable Lead Location: 753862
Implantable Pulse Generator Implant Date: 20190809
Lead Channel Impedance Value: 304 Ohm
Lead Channel Impedance Value: 342 Ohm
Lead Channel Pacing Threshold Amplitude: 0.625 V
Lead Channel Pacing Threshold Pulse Width: 0.4 ms
Lead Channel Sensing Intrinsic Amplitude: 7.875 mV
Lead Channel Sensing Intrinsic Amplitude: 7.875 mV
Lead Channel Setting Pacing Amplitude: 1.25 V
Lead Channel Setting Pacing Pulse Width: 0.4 ms
Lead Channel Setting Sensing Sensitivity: 0.3 mV
Zone Setting Status: 755011
Zone Setting Status: 755011

## 2022-08-18 NOTE — Telephone Encounter (Signed)
LVM for patient to call back [phone], or to call PCP office to schedule follow up apt. AS, CMA  

## 2022-08-19 ENCOUNTER — Other Ambulatory Visit: Payer: Self-pay

## 2022-08-19 NOTE — Telephone Encounter (Signed)
Pt's pharmacy is requesting a refill on magnesium oxide. Would Dr. Elberta Fortis like to refill this medication? Please address

## 2022-08-22 ENCOUNTER — Other Ambulatory Visit: Payer: Self-pay | Admitting: Physician Assistant

## 2022-08-30 ENCOUNTER — Ambulatory Visit (HOSPITAL_COMMUNITY)
Admission: RE | Admit: 2022-08-30 | Discharge: 2022-08-30 | Disposition: A | Discharge status: Home / Self Care | Payer: Medicare HMO | Source: Ambulatory Visit | Attending: Cardiology | Admitting: Cardiology

## 2022-08-30 ENCOUNTER — Other Ambulatory Visit: Payer: Self-pay | Admitting: Neurology

## 2022-08-30 DIAGNOSIS — I5032 Chronic diastolic (congestive) heart failure: Secondary | ICD-10-CM | POA: Diagnosis not present

## 2022-08-30 LAB — BASIC METABOLIC PANEL
Anion gap: 10 (ref 5–15)
BUN: 30 mg/dL — ABNORMAL HIGH (ref 8–23)
CO2: 25 mmol/L (ref 22–32)
Calcium: 9.2 mg/dL (ref 8.9–10.3)
Chloride: 101 mmol/L (ref 98–111)
Creatinine, Ser: 1.15 mg/dL — ABNORMAL HIGH (ref 0.44–1.00)
GFR, Estimated: 50 mL/min — ABNORMAL LOW (ref >60)
Glucose, Bld: 93 mg/dL (ref 70–99)
Potassium: 3.4 mmol/L — ABNORMAL LOW (ref 3.5–5.1)
Sodium: 136 mmol/L (ref 135–145)

## 2022-08-31 NOTE — Progress Notes (Signed)
Remote ICD transmission.   

## 2022-09-03 ENCOUNTER — Ambulatory Visit: Payer: Medicare HMO | Admitting: Orthopedic Surgery

## 2022-09-03 ENCOUNTER — Encounter: Payer: Self-pay | Admitting: Orthopedic Surgery

## 2022-09-03 DIAGNOSIS — R2241 Localized swelling, mass and lump, right lower limb: Secondary | ICD-10-CM

## 2022-09-03 NOTE — Progress Notes (Signed)
Office Visit Note   Patient: Lindsey Huber           Date of Birth: May 01, 1945           MRN: [id number] Visit Date: 09/03/2022 Requested by: Collene Mares, PA [address] [place],  [place] [address] PCP: Hyacinth Meeker, [place], [name]  Subjective: Chief Complaint  Patient presents with   Right Leg - Follow-up    HPI: Lindsey Huber is a 77 y.o. female who presents to the office reporting right calf mass.  She is doing well from her ankle fracture.  Not really having much pain around that right calf mass.  Old notes were reviewed and last clinic visit we did do an ultrasound and it appeared that it was cystic in nature..                ROS: All systems reviewed are negative as they relate to the chief complaint within the history of present illness.  Patient denies fevers or chills.  Assessment & Plan: Visit Diagnoses:  1. Lower leg mass, right     Plan: Impression is decreasing size and tenderness in distal right calf mass.  Nontender to palpation.  I think this is something we can watch.  Patient is ambulating with a cane takes Tylenol for occasional symptoms.  Follow-up with [place] as needed.  If the mass increases in size significantly or becomes more painful then reevaluation could be performed.  Follow-Up Instructions: No follow-ups on file.   Orders:  No orders of the defined types were placed in this encounter.  No orders of the defined types were placed in this encounter.     Procedures: No procedures performed   Clinical Data: No additional findings.  Objective: Vital Signs: There were no vitals taken for this visit.  Physical Exam:  Constitutional: Patient appears well-developed HEENT:  Head: Normocephalic Eyes:EOM are normal Neck: Normal range of motion Cardiovascular: Normal rate Pulmonary/chest: Effort normal Neurologic: Patient is alert Skin: Skin is warm Psychiatric: Patient has normal mood and affect  Ortho Exam: Ortho exam  demonstrates no real tenderness to palpation.  The mass in the distal medial right leg is about half the size it was last clinic visit.  Nontender to palpation.  Muscle moves freely with good plantarflexion strength.  No erythema.  No fluctuance.  Specialty Comments:  No specialty comments available.  Imaging: No results found.   PMFS History: Patient Active Problem List   Diagnosis Date Noted   Acute blood loss anemia 07/04/2022   GIB (gastrointestinal bleeding) 07/02/2022   Hemorrhagic shock (HCC) 07/02/2022   Symptomatic anemia 12/26/2021   Mild dementia 08/12/2021   Allergic rhinitis    Chronic gouty arthritis    Epistaxis    Hypoglycemia    Iron deficiency anemia    Long term (current) use of anticoagulants    Malnutrition of mild degree [name]: 75% to less than 90% of standard weight)    Oropharyngeal dysphagia    Osteopenia of neck of left femur    Raynaud's disease    Rheumatoid arthritis    Transient ischemic attack    Slow transit constipation    Thrombophilia    Achalasia    Delusional thoughts    Recurrent falls 04/09/2021   Hypotension 12/14/2020   Hypokalemia 12/14/2020   Hypocalcemia 12/14/2020   Hypomagnesemia 12/14/2020   Non-toxic multinodular goiter 08/20/2020   Acquired hypothyroidism 08/20/2020   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   Vitamin D deficiency 05/01/2020  Proteinuria 05/01/2020   Osteoarthritis of knee 05/01/2020   Osteoarthritis of hip 05/01/2020   Gastroesophageal reflux disease 05/01/2020   Idiopathic pulmonary fibrosis 05/01/2020   Cardiomyopathy 05/01/2020   Hypercoagulability due to atrial fibrillation 05/01/2020   Systemic lupus erythematosus 05/01/2020   Chronic heart failure with preserved ejection fraction 05/01/2020   Chronic kidney disease (CKD), active medical management without dialysis, stage 3 (moderate) 05/01/2020   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   Non-rheumatic atrial  fibrillation 05/01/2020   Atherosclerosis of abdominal aorta 05/01/2020   Diverticulosis of colon 05/01/2020   Cholelithiasis without obstruction 05/01/2020   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   AICD (automatic cardioverter/defibrillator) present 11/14/2019   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 11/14/2019   Mitral and aortic incompetence 11/14/2019   Hyperlipidemia 11/14/2019   Pain of left hip joint 12/12/2017   Essential (primary) hypertension 08/18/2017   CHB (complete heart block) 08/18/2017   NSVT (nonsustained ventricular tachycardia) 08/18/2017   Aortic valve disorder 03/26/2002   Past Medical History:  Diagnosis Date   Achalasia    Acquired hypothyroidism 08/20/2020   Acute metabolic encephalopathy 07/25/2021   AICD (automatic cardioverter/defibrillator) present 11/14/2019   Allergic rhinitis    Ankle fracture 03/25/2022   Aortic valve disorder 03/26/2002   Atherosclerosis of abdominal aorta (HCC) 05/01/2020   Cardiomyopathy (HCC)    CHB (complete heart block) (HCC) 08/18/2017   CHF (congestive heart failure) (HCC)    Cholelithiasis without obstruction 05/01/2020   Chronic gouty arthritis    Chronic heart failure with preserved ejection fraction (HCC) 05/01/2020   Chronic kidney disease (CKD), active medical management without dialysis, stage 3 (moderate) (HCC) 05/01/2020   Closed torus fracture of distal end of right radius with delayed healing 08/12/2021   Delusional thoughts (HCC)    Diverticulosis of colon 05/01/2020   Epistaxis    Essential (primary) hypertension 08/18/2017   Gastroesophageal reflux disease 05/01/2020   Gastrointestinal hemorrhage    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 11/14/2019   Hypercoagulability due to atrial fibrillation (HCC) 05/01/2020   Hyperlipidemia 11/14/2019   Hypocalcemia 12/14/2020   Hypoglycemia    Hypokalemia 12/14/2020   Hypomagnesemia 12/14/2020    Hypotension 12/14/2020   Idiopathic pulmonary fibrosis (HCC) 05/01/2020   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   Iron deficiency anemia    Long term (current) use of anticoagulants    Malnutrition of mild degree [name]: 75% to less than 90% of standard weight) (HCC)    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/12/2021   Mitral and aortic incompetence 11/14/2019   Non-rheumatic atrial fibrillation (HCC) 05/01/2020   Non-toxic multinodular goiter 08/20/2020   NSVT (nonsustained ventricular tachycardia) (HCC) 08/18/2017   Oropharyngeal dysphagia    Osteoarthritis of hip 05/01/2020   Osteoarthritis of knee 05/01/2020   Osteopenia of neck of left femur    Pain of left hip joint 12/12/2017   Proteinuria 05/01/2020   Raynaud's disease    Recurrent falls 04/09/2021   Rheumatoid arthritis (HCC)    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   Septic shock (HCC) 03/10/2020   Slow transit constipation    Systemic lupus erythematosus (HCC) 05/01/2020   Thrombophilia (HCC)    Transient ischemic attack    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   Vitamin D deficiency     Family History  Problem Relation Age of Onset   Heart disease Mother    Hypertension Mother    Rheum arthritis Mother    Osteoarthritis  Mother    Heart disease Father    Hypertension Father    Osteoarthritis Father    Heart disease Sister    Diabetes Brother    Cancer Brother     Past Surgical History:  Procedure Laterality Date   BREAST BIOPSY Left    CARDIAC VALVE SURGERY     CARPAL TUNNEL RELEASE     COLONOSCOPY WITH PROPOFOL N/A 12/20/2020   Procedure: COLONOSCOPY WITH PROPOFOL;  Surgeon: Willis Modena, MD;  Location: [facility] ENDOSCOPY;  Service: Endoscopy;  Laterality: N/A;   HEMORRHOID SURGERY     PACEMAKER INSERTION     RIGHT/LEFT HEART CATH AND CORONARY ANGIOGRAPHY N/A 11/27/2020   Procedure: RIGHT/LEFT HEART CATH AND CORONARY ANGIOGRAPHY;  Surgeon: Dolores Patty, MD;  Location:  [facility] INVASIVE [facility];  Service: Cardiovascular;  Laterality: N/A;   TONSILLECTOMY     Social History   Occupational History   Occupation: Retired    Comment: Community education officer business  Tobacco Use   Smoking status: Never    Passive exposure: Past   Smokeless tobacco: Never  Vaping Use   Vaping status: Never Used  Substance and Sexual Activity   Alcohol use: Never   Drug use: Never   Sexual activity: Not Currently

## 2022-09-05 NOTE — Progress Notes (Unsigned)
Cardiology Office Note Date:  09/05/2022  Patient ID:  Lindsey Huber, Lindsey Huber 11/30/1945, MRN [id number] PCP:  Collene Mares, PA  Cardiologist:  Dr. [name] Electrophysiologist: Dr. Lalla Brothers    Chief Complaint:  *** 6-8 week f/u  History of Present Illness: Lindsey Huber is a 77 y.o. female with history of IPF, HTN, GERD, AFib/flutter (permanent), VT, torsades (in setting of flecainide) > ICD, ? Hx of TIA hx,  VHD w/ bioprosthetic AVR, MV ring, TV replacement (2012), CKD (III), SLE, anemia of chronic disease, hypothyroidism/MNG, chronic CHF (diastolic), ? Adrenal insufficiency  1. On September 25, 2010 Lindsey patient was brought to [facility] operating room with Dr. [name] and underwent aortic valve replacement, bioprosthetic, excision of Lindsey subaortic membrane, mitral valve annuloplasty, tricuspid valve repair followed by tricuspid valve replacement, and manual extraction of right ventricular lead. At that time she was brought to [facility] intensive care unit with an open chest secondary to continued bleeding. 2. On September 3rd Lindsey patient was brought back to [facility] operating room with Dr. [name] for reexploration of Lindsey mediastinum with mediastinal washout and sternal closure. 3. A third procedure was performed on September 20 by Dr. [name] which included an ICD lead insertion and generator replacement with a Medtronic ICD.  She saw Dr. Lalla Brothers April 2022, to establish management of her device, moving from [place]. She reported some nose bleeds and changed to Xarelto.  Nov 2022, admitted with GIB, CT scan showed acute GI hemorrhage involving mid descending colon, likely acute diverticular bleed. Transfused 3u RBCs. Colonoscopy 11/26 which noted hemorrhoids and diverticulosis, no active bleeding, recommended to restart Xarelto in 3 days if stable  She had an ER visit Feb 2023 with some transient confusion, word finding difficulty.  CT brain without acute findings, labs  unremarkable.  She had fall a couple days prior striking her arm, felt to have a cellulitis They did get hx of perhaps TIA in June of last year Neurology consulted, recommended MRI though, unable to get MRI done on Lindsey weekend given her device and pt/family preferred not to wait on that given resolved symptoms Discharged on Keflex and advised to f/u with neurology  She has been established with [facility] HF team, saw Dr. [name]  04/23/21, doing OK, waxing/waning DOE/SOB, exertional capacity., weight stable Planned to update her echo to re-evaluate her VHD, discussed  that "she has significant prosthetic valve TR with RV failure. Pulmonary pressures only mildly elevated, Discussed need for closer volume management with sliding scale lasix. Can switch to torsemide as needed, Will repeat echo at next visit. If RV failure and TR worsens may need to consider percutaneous valve options"   I saw her 04/29/21 She comes with her grand Huber. Since her visit with Dr. [name], no new symptoms or concerns She is currently getting an ambulatory EEG via neurology team. No syncope, no device therapies Occassionally she feel like Lindsey device sits towards he axilla, though otherwise no concerns. She reports her arm healed up well No bleeding, signs of bleeding No VT, volume stable No changes were made, planned for an annual visit, to c/w HF team  No further cardiac appt since then.  05/29/21: ER visit after a fall, tripped, device interrogated, normal findings reported, imaging was iok,  07/25/21: AMS, poor sleep, increasing AMS, "known dementia", observed paranoid behavior, perhaps hallucinations, admitted for further management Discharged 07/27/21 to f/u with PMD 07/30/21: ER visit, refusing meds, AMS, dementia  Following with neurosych vascular vs Lewy body dementia, ?  also mentions poss complex partial seizure, w/u ongoing, physchology since then regularly and has seen endo for MNG/adrenal insufficiency  DEVICE  remote with NSVTs,longest 15 seconds and f/u arranged  I saw her 11/26/21 She is accompanied by her grand Huber that she lives with, both she and her mom (Lindsey Huber) help with organizing her medicines. She reports that she is much better about taking her meds, will at times forget, but not regularly anymore. Lindsey patient reports she feels well, not perfect, but well. Denies any CP, palpitations or cardiac awareness. No SOB, not overly active, gets a little winded with longer walking No dizzy spells, near syncope or syncope. She reports her home weight as stable, wobbles with Lindsey 3 same pounds up/down. Had no further VTs Discussed intermittent medication noncompliance and importance of her meds. OptiVol upward trending but exam/weights stable Advised HF team f/u  Saw A. Clegg, NP on 12/24/21, reported baseline DOE, stable weights, and good medication compliance.  Volume looked stable, additional PRN lasix dose discussed, planned for echo  Admitted via an UCC 12/26/21 after a fall ( a week prior), while at [facility] became acutely SOB and transferred to to [facility] Found acutely anemic with Hgb 7.5 and a large R thigh hematoma after her fall, transfused 2U to a Hgb of 10.7 Flash p.edema felt provoked by acute anemia, treated with IV lasix Planned to hold Xarelto 5 days and follow up with cardiology Discharged 12/29/21.  I saw her 01/04/22,  She is accompanied by her Huber today Her hip hematoma they both report as much smaller/improved, not resolved No longer painful. No rest SOB but gets winded with increased activity, this is at her baseline again, much better then when she went into [facility] No CP, palpitations or cardiac awareness Good medication compliance No syncope. Her fall was mechanical, knee gave out. She saw her PMD Friday had labs done H/H 10.6/32 Plts 196 BUN/Creat  36/1.16 K+ 3.3 Volume stable No changes made  She saw [place] HF team a couple times, most recent  Dr. [name] 03/18/22, good days, bad days, unsteady with recurrent falls. 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 Required 4 units PRBC, KCentra & Vitamin K.   She saw B. Ollis, NP 07/13/22, doing better post d/c, LLE in a boot w/fracture, tolerating meds, her PMD had recently increased by her lasix No changes were made.  *** xarelto, dose, bleeding  *** labs, lytes, PMD? *** HF team? *** volume **** VT  Device information Current device is a MDT single chamber ICD implanted 10/15/2010, gen change 09/02/2017 There is mention of ?damage to one of her device leads during her valvular surgery Secondary prevention indication with reports of torsades/VT In review of her CXRs, appears she has an abandoned RA lead and a broken/abandoned dual coil RV lead. Original device implanted 06/23/2006, indication was for "Sick sinus syndrome, atrial standstill, advanced arteriovenous block (AV Wenckebach rate 80 beats per minute), VT/VF arrest (torsades), mixed connective tissue disease"  Past Medical History:  Diagnosis Date   Achalasia    Acquired hypothyroidism 08/20/2020   Acute metabolic encephalopathy 07/25/2021  AICD (automatic cardioverter/defibrillator) present 11/14/2019   Allergic rhinitis    Ankle fracture 03/25/2022   Aortic valve disorder 03/26/2002   Atherosclerosis of abdominal aorta (HCC) 05/01/2020   Cardiomyopathy (HCC)    CHB (complete heart block) (HCC) 08/18/2017   CHF (congestive heart failure) (HCC)    Cholelithiasis without obstruction 05/01/2020   Chronic gouty arthritis    Chronic heart failure with preserved  ejection fraction (HCC) 05/01/2020   Chronic kidney disease (CKD), active medical management without dialysis, stage 3 (moderate) (HCC) 05/01/2020   Closed torus fracture of distal end of right radius with delayed healing 08/12/2021   Delusional thoughts (HCC)    Diverticulosis of colon 05/01/2020   Epistaxis    Essential (primary) hypertension 08/18/2017   Gastroesophageal reflux disease 05/01/2020   Gastrointestinal hemorrhage    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 11/14/2019   Hypercoagulability due to atrial fibrillation (HCC) 05/01/2020   Hyperlipidemia 11/14/2019   Hypocalcemia 12/14/2020   Hypoglycemia    Hypokalemia 12/14/2020   Hypomagnesemia 12/14/2020   Hypotension 12/14/2020   Idiopathic pulmonary fibrosis (HCC) 05/01/2020   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   Iron deficiency anemia    Long term (current) use of anticoagulants    Malnutrition of mild degree [name]: 75% to less than 90% of standard weight) (HCC)    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/12/2021   Mitral and aortic incompetence 11/14/2019   Non-rheumatic atrial fibrillation (HCC) 05/01/2020   Non-toxic multinodular goiter 08/20/2020   NSVT (nonsustained ventricular tachycardia) (HCC) 08/18/2017   Oropharyngeal dysphagia    Osteoarthritis of hip 05/01/2020   Osteoarthritis of knee 05/01/2020   Osteopenia of neck of left femur    Pain of left hip joint 12/12/2017   Proteinuria 05/01/2020   Raynaud's disease    Recurrent falls 04/09/2021   Rheumatoid arthritis (HCC)    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   Septic shock (HCC) 03/10/2020   Slow transit constipation    Systemic lupus erythematosus (HCC) 05/01/2020   Thrombophilia (HCC)    Transient ischemic attack    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   Vitamin D deficiency     Past Surgical History:  Procedure Laterality Date   BREAST BIOPSY Left    CARDIAC VALVE SURGERY      CARPAL TUNNEL RELEASE     COLONOSCOPY WITH PROPOFOL N/A 12/20/2020   Procedure: COLONOSCOPY WITH PROPOFOL;  Surgeon: Willis Modena, MD;  Location: [facility] ENDOSCOPY;  Service: Endoscopy;  Laterality: N/A;   HEMORRHOID SURGERY     PACEMAKER INSERTION     RIGHT/LEFT HEART CATH AND CORONARY ANGIOGRAPHY N/A 11/27/2020   Procedure: RIGHT/LEFT HEART CATH AND CORONARY ANGIOGRAPHY;  Surgeon: Dolores Patty, MD;  Location: [facility] INVASIVE CV LAB;  Service: Cardiovascular;  Laterality: N/A;   TONSILLECTOMY      Current Outpatient Medications  Medication Sig Dispense Refill   acetaminophen (TYLENOL) 500 MG tablet Take 1,000 mg by mouth every 6 (six) hours as needed (pain).     albuterol (VENTOLIN HFA) 108 (90 Base) MCG/ACT inhaler Inhale 2 puffs into Lindsey lungs every 6 (six) hours as needed for wheezing or shortness of breath.     allopurinol (ZYLOPRIM) 100 MG tablet TAKE 1 TABLET BY MOUTH EVERY DAY 90 tablet 1   amoxicillin (AMOXIL) 500 MG capsule 4 capsules by mouth 1 hour before dental procedure.     Budeson-Glycopyrrol-Formoterol (BREZTRI AEROSPHERE) 160-9-4.8 MCG/ACT AERO Inhale 2 puffs into Lindsey lungs 2 (two) times daily as needed ("for  flares").     calcium-vitamin D (OSCAL WITH D) 500-5 MG-MCG tablet Take 2 tablets by mouth 2 (two) times daily. 120 tablet 0   cetirizine HCl (ZYRTEC) 5 MG/5ML SOLN 1 mL as needed Orally Twice a day for 30 day(s)     Cholecalciferol (VITAMIN D3) 25 MCG (1000 UT) CAPS Take 1,000 Units by mouth daily with lunch.     fluticasone (FLONASE) 50 MCG/ACT nasal spray PLACE 1 SPRAY INTO BOTH NOSTRILS 2 (TWO) TIMES DAILY 48 mL 2   furosemide (LASIX) 40 MG tablet Take 1 tablet (40 mg total) by mouth daily. 180 tablet 3   Glycerin-Hypromellose-PEG 400 (DRY EYE RELIEF DROPS OP) Place 1 drop into both eyes in Lindsey morning.     hydroxychloroquine (PLAQUENIL) 200 MG tablet TAKE 1 TABLET BY MOUTH EVERY DAY 90 tablet 1   levothyroxine (SYNTHROID) 25 MCG tablet Take 1 tablet (25 mcg  total) by mouth daily. 90 tablet 3   magnesium oxide (MAG-OX) 400 (240 Mg) MG tablet TAKE 1 TABLET BY MOUTH EVERY DAY 90 tablet 3   metoprolol tartrate (LOPRESSOR) 25 MG tablet Take 0.5 tablets (12.5 mg total) by mouth 2 (two) times daily. 30 tablet 0   Nintedanib (OFEV) 150 MG CAPS Take 150 mg by mouth at bedtime.     OLANZapine (ZYPREXA) 2.5 MG tablet TAKE 1 TABLET BY MOUTH EVERYDAY AT BEDTIME 90 tablet 0   pantoprazole (PROTONIX) 40 MG tablet Take 1 tablet (40 mg total) by mouth 2 (two) times daily. 60 tablet 0   polyethylene glycol powder (MIRALAX) 17 GM/SCOOP powder as directed Orally Once a day for 30 day(s)     potassium chloride (KLOR-CON M10) 10 MEQ tablet TAKE 2 TABLETS BY MOUTH DAILY 180 tablet 2   predniSONE (DELTASONE) 5 MG tablet TAKE 1 TABLET (5 MG TOTAL) BY MOUTH DAILY WITH BREAKFAST. PATIENT TO DOUBLE UP DOSE DURING SICK DAY RULE 100 tablet 3   Rivaroxaban (XARELTO) 15 MG TABS tablet Take 1 tablet (15 mg total) by mouth daily with supper. 90 tablet 1   No current facility-administered medications for this visit.    Allergies:   Other, Propofol, Flecainide, Amiodarone, and Amlodipine   Social History:  Lindsey patient  reports that she has never smoked. She has been exposed to tobacco smoke. She has never used smokeless tobacco. She reports that she does not drink alcohol and does not use drugs.   Family History:  Lindsey patient's family history includes Cancer in her brother; Diabetes in her brother; Heart disease in her father, mother, and sister; Hypertension in her father and mother; Osteoarthritis in her father and mother; Rheum arthritis in her mother.  ROS:  Please see Lindsey history of present illness.    All other systems are reviewed and otherwise negative.   PHYSICAL EXAM:  VS:  There were no vitals taken for this visit. BMI: There is no height or weight on file to calculate BMI. Well nourished, well developed, in no acute distress HEENT: normocephalic, atraumatic Neck: no  JVD, carotid bruits or masses Cardiac:   RRR; (paced), 2-3/6 SM, no rubs, or gallops Lungs:  CTA b/l, no wheezing, rhonchi or rales Abd: soft, nontender MS: no deformity, advanced  atrophy Ext: trace edema RLE chronically by pt report Skin: warm and dry, no rash Neuro:  No gross deficits appreciated Psych: euthymic mood, full affect  ICD site is stable, no tethering or discomfort, pocket looks good   EKG:  not done today  Device interrogation done  today and reviewed by myself:  Battery and lead measurements are good No VT 99.7 VP%  05/13/21: TTE 1. Left ventricular ejection fraction, by estimation, is 50 to 55%. Lindsey  left ventricle has low normal function. Lindsey left ventricle has no regional  wall motion abnormalities. There is severe concentric left ventricular  hypertrophy. Left ventricular  diastolic function could not be evaluated.   2. Right ventricular systolic function is moderately reduced. Lindsey right  ventricular size is normal. There is mildly elevated pulmonary artery  systolic pressure.   3. Left atrial size was severely dilated.   4. Right atrial size was severely dilated.   5. Lindsey mitral valve has been repaired/replaced. Mild mitral valve  regurgitation. Mild mitral stenosis. Lindsey mean mitral valve gradient is 5.0  mmHg. There is a prosthetic annuloplasty ring present in Lindsey mitral  position.   6. Lindsey tricuspid valve is has been repaired/replaced. Lindsey tricuspid valve  is status post repair with an annuloplasty ring. Tricuspid valve  regurgitation is moderate. Mild tricuspid stenosis.   7. Lindsey aortic valve has been repaired/replaced. Aortic valve  regurgitation is not visualized. Moderate aortic valve stenosis. There is  a 19 mm bovine valve present in Lindsey aortic position. Echo findings are  consistent with normal structure and function  of Lindsey aortic valve prosthesis.   8. Lindsey inferior vena cava is dilated in size with >50% respiratory  variability, suggesting  right atrial pressure of 8 mmHg.   Comparison(s): No significant change from prior study. Increase in Aortic  valve mean gradient, from 18.6 mmHg to 35 mmHg.    R/LHC 11/27/20 Ao =129/63 (89) LV = 141/8 RA = 11 (v waves to 14) RV = 43/4 PA = 42/15 (25) PCW = 10 Fick cardiac output/index = 5.7/3.7 PVR = 2.1 WU SVR = 1101 PAPi = 2.45 WU Ao sat = 96% PA sat =  74%, 75% SVC sat = 82% MVA (LV-wedge tracing) = 3.5cm2 mean gradient across MV 3.76mmHG  Assessment:  1. Normal coronary arteries 2. EF 60-65% 3. Very mild PAH 4. Normal left-sided filling pressures with no significant v-waves in PCWP tracing to suggest hemodynamically significant MR 5. No significant stenosis across AoV, MV or TV   10/31/2020: TTE IMPRESSIONS   1. Left ventricular ejection fraction, by estimation, is 50 to 55%. Lindsey  left ventricle has low normal function. Lindsey left ventricle has no regional  wall motion abnormalities. There is severe concentric left ventricular  hypertrophy. Left ventricular  diastolic parameters are indeterminate. There is Abnormal septal motion.   2. Right ventricular systolic function is moderately reduced. Lindsey right  ventricular size is moderately enlarged. There is severely elevated  pulmonary artery systolic pressure.   3. Left atrial size was severely dilated.   4. Right atrial size was severely dilated.   5. Lindsey mitral valve has been repaired. Lindsey annular ring appears to be  well seated. Mild to moderate mitral valve regurgitation. No evidence of  mitral stenosis. Procedure Date: 2012.   6. Lindsey tricuspid valve is has been repaired, there is a 20 mm bovine  pericardial valve. Tricuspid valve regurgitation is moderate.   7. Lindsey aortic valve has been replaced. Lindsey bioprosthetic valve is  well-seated. There is a 19 mm bovine pericardial valve present in Lindsey  aortic position. Procedure Date: 2012. No aortic stenosis or regurgitation  present.   8. Mild dilatation of Lindsey ascending  aorta, measuring 39 mm. Abdominal  aorta is dilated measuring 2.83 cm.  9. Lindsey inferior vena cava is normal in size with greater than 50%  respiratory variability, suggesting right atrial pressure of 3 mmHg.   Comparison(s): Most recent study 03/11/2020 report reviewed in care  everywhere.   Recent Labs: 12/26/2021: B Natriuretic Peptide 718.0 07/05/2022: ALT 43 07/10/2022: Hemoglobin 9.0; Magnesium 1.9; Platelets 145 08/30/2022: BUN 30; Creatinine, Ser 1.15; Potassium 3.4; Sodium 136  No results found for requested labs within last 365 days.   CrCl cannot be calculated (Unknown ideal weight.).   Wt Readings from Last 3 Encounters:  07/13/22 133 lb (60.3 kg)  07/10/22 133 lb 6.1 oz (60.5 kg)  06/25/22 123 lb (55.8 kg)     Other studies reviewed: Additional studies/records reviewed today include: summarized above  ASSESSMENT AND PLAN:  ICD VT *** Intact function *** No programming changes made *** No VT   Permanent AFib CHA2DS2Vasc is 6, on xarelto,  *** appropriately dosed No R waves at 40 today  She has had bleeding and falls, though with permanent AF and her multiple valve replacement/repairs, needs a/c ***  Chronic CHF (diastolic) VHD aortic valve replacement, bioprosthetic, excision of Lindsey subaortic membrane, mitral valve annuloplasty, tricuspid valve repair P.HTN IPF Had been following closely with HF and pulmonary teams *** OptiVol  *** Exam does not suggest volume OL    8.  HTN ***   Disposition: ***   Current medicines are reviewed at length with Lindsey patient today.  Lindsey patient did not have any concerns regarding medicines.  Norma Fredrickson, PA-C 09/05/2022 1:25 PM     [facility] [address] [place] [address] [phone] (office)  [phone] (fax)

## 2022-09-07 ENCOUNTER — Ambulatory Visit: Payer: Medicare HMO | Attending: Physician Assistant | Admitting: Physician Assistant

## 2022-09-07 ENCOUNTER — Encounter: Payer: Self-pay | Admitting: Physician Assistant

## 2022-09-07 VITALS — BP 120/86 | HR 72 | Ht 66.0 in | Wt 118.6 lb

## 2022-09-07 DIAGNOSIS — D6869 Other thrombophilia: Secondary | ICD-10-CM

## 2022-09-07 DIAGNOSIS — Z952 Presence of prosthetic heart valve: Secondary | ICD-10-CM

## 2022-09-07 DIAGNOSIS — I4821 Permanent atrial fibrillation: Secondary | ICD-10-CM | POA: Diagnosis not present

## 2022-09-07 DIAGNOSIS — I472 Ventricular tachycardia, unspecified: Secondary | ICD-10-CM

## 2022-09-07 DIAGNOSIS — Z95810 Presence of automatic (implantable) cardiac defibrillator: Secondary | ICD-10-CM | POA: Diagnosis not present

## 2022-09-07 LAB — CUP PACEART INCLINIC DEVICE CHECK
Battery Remaining Longevity: 45 mo
Battery Voltage: 2.97 V
Brady Statistic RV Percent Paced: 98.96 %
Date Time Interrogation Session: 20240813173124
HighPow Impedance: 60 Ohm
Implantable Lead Connection Status: 753985
Implantable Lead Implant Date: 20120920
Implantable Lead Location: 753862
Implantable Pulse Generator Implant Date: 20190809
Lead Channel Impedance Value: 285 Ohm
Lead Channel Impedance Value: 342 Ohm
Lead Channel Pacing Threshold Amplitude: 0.625 V
Lead Channel Pacing Threshold Pulse Width: 0.4 ms
Lead Channel Sensing Intrinsic Amplitude: 7.875 mV
Lead Channel Sensing Intrinsic Amplitude: 7.875 mV
Lead Channel Setting Pacing Amplitude: 1.25 V
Lead Channel Setting Pacing Pulse Width: 0.4 ms
Lead Channel Setting Sensing Sensitivity: 0.3 mV
Zone Setting Status: 755011
Zone Setting Status: 755011

## 2022-09-07 NOTE — Patient Instructions (Signed)
Medication Instructions:   Your physician recommends that you continue on your current medications as directed. Please refer to the Current Medication list given to you today.   *If you need a refill on your cardiac medications before your next appointment, please call your pharmacy*    If you have labs (blood work) drawn today and your tests are completely normal, you will receive your results only by: MyChart Message (if you have MyChart) OR A paper copy in the mail If you have any lab test that is abnormal or we need to change your treatment, we will call you to review the results.    Testing/Procedures: NONE ORDERED  TODAY     Follow-Up: At [facility], you and your health needs are our priority.  As part of our continuing mission to provide you with exceptional heart care, we have created designated Provider Care Teams.  These Care Teams include your primary Cardiologist (physician) and Advanced Practice Providers (APPs -  Physician Assistants and Nurse Practitioners) who all work together to provide you with the care you need, when you need it.  We recommend signing up for the patient portal called "MyChart".  Sign up information is provided on this After Visit Summary.  MyChart is used to connect with patients for Virtual Visits (Telemedicine).  Patients are able to view lab/test results, encounter notes, upcoming appointments, etc.  Non-urgent messages can be sent to your provider as well.   To learn more about what you can do with MyChart, go to [email or web address].    Your next appointment:    1 year   Provider:   You may see Lanier Prude, MD or one of the following Advanced Practice Providers on your designated Care Team:   Francis Dowse, [place] Casimiro Needle "Mardelle Matte" Lanna Poche, [place]   Other Instructions

## 2022-09-17 ENCOUNTER — Other Ambulatory Visit: Payer: Self-pay | Admitting: Physician Assistant

## 2022-09-20 NOTE — Telephone Encounter (Signed)
Prescription refill request for Xarelto received.  Indication:afib Last office visit:8/24 Weight:53.8  kg Age:77 Scr:1.15  8/24 CrCl:35.9  ml/min  Prescription refilled

## 2022-09-21 ENCOUNTER — Other Ambulatory Visit: Payer: Self-pay | Admitting: Internal Medicine

## 2022-09-21 DIAGNOSIS — M329 Systemic lupus erythematosus, unspecified: Secondary | ICD-10-CM

## 2022-09-21 NOTE — Telephone Encounter (Signed)
Last Fill: 03/29/2022  Eye exam: 02/19/2022 nothing abnormal related to PLQ   Labs: 08/30/2022 BMP Potassium 3.4 BUN 30 Creatinine Ser 1.15 GFR 50  07/10/2022 CBC RBC 3.07 Hemoglobin 9.0 HCT 28.3 RDW 21.2 Platelets 145 nRBC 0.3  Next Visit: 11/08/2022  Last Visit: 05/03/2022  QM:VHQIONGE lupus erythematosus, unspecified SLE type, unspecified organ involvement status   Current Dose per office note 05/03/2022: hydroxychloroquine 200 mg daily   Okay to refill Plaquenil?

## 2022-09-28 ENCOUNTER — Ambulatory Visit
Admission: RE | Admit: 2022-09-28 | Discharge: 2022-09-28 | Disposition: A | Discharge status: Home / Self Care | Payer: Medicare HMO | Source: Ambulatory Visit | Attending: Internal Medicine | Admitting: Internal Medicine

## 2022-09-28 DIAGNOSIS — M85852 Other specified disorders of bone density and structure, left thigh: Secondary | ICD-10-CM

## 2022-09-29 ENCOUNTER — Emergency Department (HOSPITAL_COMMUNITY)
Admission: EM | Admit: 2022-09-29 | Discharge: 2022-09-30 | Disposition: A | Discharge status: Home / Self Care | Payer: Medicare HMO | Attending: Emergency Medicine | Admitting: Emergency Medicine

## 2022-09-29 ENCOUNTER — Encounter (HOSPITAL_COMMUNITY): Payer: Self-pay | Admitting: Emergency Medicine

## 2022-09-29 ENCOUNTER — Other Ambulatory Visit: Payer: Self-pay

## 2022-09-29 DIAGNOSIS — R4701 Aphasia: Secondary | ICD-10-CM | POA: Diagnosis present

## 2022-09-29 DIAGNOSIS — G459 Transient cerebral ischemic attack, unspecified: Secondary | ICD-10-CM | POA: Diagnosis not present

## 2022-09-29 DIAGNOSIS — I509 Heart failure, unspecified: Secondary | ICD-10-CM | POA: Diagnosis not present

## 2022-09-29 DIAGNOSIS — Z7901 Long term (current) use of anticoagulants: Secondary | ICD-10-CM | POA: Insufficient documentation

## 2022-09-29 DIAGNOSIS — I5032 Chronic diastolic (congestive) heart failure: Secondary | ICD-10-CM | POA: Diagnosis not present

## 2022-09-29 DIAGNOSIS — I130 Hypertensive heart and chronic kidney disease with heart failure and stage 1 through stage 4 chronic kidney disease, or unspecified chronic kidney disease: Secondary | ICD-10-CM | POA: Diagnosis not present

## 2022-09-29 DIAGNOSIS — N183 Chronic kidney disease, stage 3 unspecified: Secondary | ICD-10-CM | POA: Insufficient documentation

## 2022-09-29 LAB — CBG MONITORING, ED: Glucose-Capillary: 113 mg/dL — ABNORMAL HIGH (ref 70–99)

## 2022-09-29 NOTE — ED Triage Notes (Signed)
Patient from home BIB [facility]. Patient's family called EMS with concern of the patient exhibiting slurred speech and facial droop to the R side, increased confusion and increased drooling. EMS did not note any of these symptoms on assessment so code stroke not activated. Per family last seen normal was 7 pm tonight. Patient with hx of dementia however Aox4 upon my questioning. Denies any complaints of chest pain, shortness of breath, abdominal pain, n/v/d.  VSS w/ EMS. CBG 122.

## 2022-09-29 NOTE — ED Provider Notes (Signed)
[facility]-EMER[facility]ENCY DEPT [facility] Emergency Department Provider Note MRN:  [id number]  Arrival date & time: 09/30/22     Chief Complaint   Slurred speech History of Present Illness   Lindsey Huber is a 77 y.o. year-old female with a history of cardiomyopathy, CKD, dementia presenting to the ED with chief complaint of slurred speech.  Family called EMS with concern for slurred speech, facial droop on the right side, increased confusion, drooling.  By the time EMS got there the symptoms were not present.  Last seen normal 7 PM.  Patient with no complaints at this time.  Review of Systems  A thorough review of systems was obtained and all systems are negative except as noted in the HPI and PMH.   Patient's Health History    Past Medical History:  Diagnosis Date   Achalasia    Acquired hypothyroidism 08/20/2020   Acute metabolic encephalopathy 07/25/2021   AICD (automatic cardioverter/defibrillator) present 11/14/2019   Allergic rhinitis    Ankle fracture 03/25/2022   Aortic valve disorder 03/26/2002   Atherosclerosis of abdominal aorta (HCC) 05/01/2020   Cardiomyopathy (HCC)    CHB (complete heart block) (HCC) 08/18/2017   CHF (congestive heart failure) (HCC)    Cholelithiasis without obstruction 05/01/2020   Chronic gouty arthritis    Chronic heart failure with preserved ejection fraction (HCC) 05/01/2020   Chronic kidney disease (CKD), active medical management without dialysis, stage 3 (moderate) (HCC) 05/01/2020   Closed torus fracture of distal end of right radius with delayed healing 08/12/2021   Delusional thoughts (HCC)    Diverticulosis of colon 05/01/2020   Epistaxis    Essential (primary) hypertension 08/18/2017   Gastroesophageal reflux disease 05/01/2020   Gastrointestinal hemorrhage    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 11/14/2019   Hypercoagulability due to atrial fibrillation (HCC) 05/01/2020   Hyperlipidemia  11/14/2019   Hypocalcemia 12/14/2020   Hypoglycemia    Hypokalemia 12/14/2020   Hypomagnesemia 12/14/2020   Hypotension 12/14/2020   Idiopathic pulmonary fibrosis (HCC) 05/01/2020   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   Iron deficiency anemia    Long term (current) use of anticoagulants    Malnutrition of mild degree [name]: 75% to less than 90% of standard weight) (HCC)    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/12/2021   Mitral and aortic incompetence 11/14/2019   Non-rheumatic atrial fibrillation (HCC) 05/01/2020   Non-toxic multinodular goiter 08/20/2020   NSVT (nonsustained ventricular tachycardia) (HCC) 08/18/2017   Oropharyngeal dysphagia    Osteoarthritis of hip 05/01/2020   Osteoarthritis of knee 05/01/2020   Osteopenia of neck of left femur    Pain of left hip joint 12/12/2017   Proteinuria 05/01/2020   Raynaud's disease    Recurrent falls 04/09/2021   Rheumatoid arthritis (HCC)    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   Septic shock (HCC) 03/10/2020   Slow transit constipation    Systemic lupus erythematosus (HCC) 05/01/2020   Thrombophilia (HCC)    Transient ischemic attack    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   Vitamin D deficiency     Past Surgical History:  Procedure Laterality Date   BREAST BIOPSY Left    CARDIAC VALVE SURGERY     CARPAL TUNNEL RELEASE     COLONOSCOPY WITH PROPOFOL N/A 12/20/2020   Procedure: COLONOSCOPY WITH PROPOFOL;  Surgeon: Willis Modena, MD;  Location: [facility] ENDOSCOPY;  Service: Endoscopy;  Laterality: N/A;   HEMORRHOID SURGERY     PACEMAKER INSERTION     RIGHT/LEFT  HEART CATH AND CORONARY ANGIOGRAPHY N/A 11/27/2020   Procedure: RIGHT/LEFT HEART CATH AND CORONARY ANGIOGRAPHY;  Surgeon: Dolores Patty, MD;  Location[facility] INVASIVE CV LAB;  Service: Cardiovascular;  Laterality: N/A;   TONSILLECTOMY      Family History  Problem Relation Age of Onset   Heart disease Mother    Hypertension Mother     Rheum arthritis Mother    Osteoarthritis Mother    Heart disease Father    Hypertension Father    Osteoarthritis Father    Heart disease Sister    Diabetes Brother    Cancer Brother     Social History   Socioeconomic History   Marital status: Legally Separated    Spouse name: Not on file   Number of children: Not on file   Years of education: 16   Highest education level: Bachelor's degree (e.g., BA, AB, BS)  Occupational History   Occupation: Retired    Comment: Community education officer business  Tobacco Use   Smoking status: Never    Passive exposure: Past   Smokeless tobacco: Never  Vaping Use   Vaping status: Never Used  Substance and Sexual Activity   Alcohol use: Never   Drug use: Never   Sexual activity: Not Currently  Other Topics Concern   Not on file  Social History Narrative   Right Handed    Social Determinants of Health   Financial Resource Strain: Not on file  Food Insecurity: No Food Insecurity (12/26/2021)   Hunger Vital Sign    Worried About Running Out of Food in the Last Year: Never true    Ran Out of Food in the Last Year: Never true  Transportation Needs: No Transportation Needs (12/26/2021)   PRAPARE - Administrator, Civil Service (Medical): No    Lack of Transportation (Non-Medical): No  Physical Activity: Not on file  Stress: Not on file  Social Connections: Not on file  Intimate Partner Violence: Not At Risk (12/26/2021)   Humiliation, Afraid, Rape, and Kick questionnaire    Fear of Current or Ex-Partner: No    Emotionally Abused: No    Physically Abused: No    Sexually Abused: No     Physical Exam   Vitals:   09/30/22 0240 09/30/22 0245  BP:    Pulse: 70 70  Resp: 20 16  Temp:    SpO2: 96% 96%    CONSTITUTIONAL: Well-appearing, NAD NEURO/PSYCH:  Alert and oriented x 3, normal and symmetric strength and sensation, normal coordination, normal speech EYES:  eyes equal and reactive ENT/NECK:  no LAD, no JVD CARDIO: Regular rate,  well-perfused, normal S1 and S2 PULM:  CTAB no wheezing or rhonchi GI/GU:  non-distended, non-tender MSK/SPINE:  No gross deformities, no edema SKIN:  no rash, atraumatic   *Additional and/or pertinent findings included in MDM below  Diagnostic and Interventional Summary    EKG Interpretation Date/Time:  Wednesday September 29 2022 23:53:53 EDT Ventricular Rate:  70 PR Interval:  220 QRS Duration:  146 QT Interval:  499 QTC Calculation: 539 R Axis:   -88  Text Interpretation: Ventricular-paced rhythm No further analysis attempted due to paced rhythm Confirmed by Kennis Carina [phone]) on 09/30/2022 2:50:57 AM       Labs Reviewed  PROTIME-INR - Abnormal; Notable for the following components:      Result Value   Prothrombin Time 19.7 (*)    INR 1.6 (*)    All other components within normal limits  CBC - Abnormal;  Notable for the following components:   RBC 3.21 (*)    Hemoglobin 10.4 (*)    HCT 32.3 (*)    MCV 100.6 (*)    RDW 15.6 (*)    All other components within normal limits  COMPREHENSIVE METABOLIC PANEL - Abnormal; Notable for the following components:   Chloride 97 (*)    Glucose, Bld 117 (*)    BUN 28 (*)    Creatinine, Ser 1.47 (*)    Total Protein 8.6 (*)    AST 42 (*)    GFR, Estimated 37 (*)    Anion gap 16 (*)    All other components within normal limits  CBG MONITORING, ED - Abnormal; Notable for the following components:   Glucose-Capillary 113 (*)    All other components within normal limits  I-STAT CHEM 8, ED - Abnormal; Notable for the following components:   BUN 36 (*)    Creatinine, Ser 1.70 (*)    Glucose, Bld 119 (*)    Calcium, Ion 1.07 (*)    Hemoglobin 11.6 (*)    HCT 34.0 (*)    All other components within normal limits  ETHANOL  APTT  DIFFERENTIAL  URINALYSIS, ROUTINE W REFLEX MICROSCOPIC    CT ANGIO HEAD NECK W WO CM  Final Result    CT HEAD WO CONTRAST ( )  Final Result      Medications  iohexol (OMNIPAQUE) 350 MG/ML  injection 75 mL (75 mLs Intravenous Contrast Given 09/30/22 0136)     Procedures  /  Critical Care Procedures  ED Course and Medical Decision Making  Initial Impression and Ddx Concerning symptoms at home that resolved.  Currently within normal neurological exam.  Question TIA.  Will attempt further history taking from family when they get here in a few minutes.  Will initiate TIA workup.  Past medical/surgical history that increases complexity of ED encounter: Pacemaker, TIA  Interpretation of Diagnostics I personally reviewed the EKG and my interpretation is as follows: Paced rhythm  Labs overall reassuring with no significant blood count or electrolyte disturbance.  Patient Reassessment and Ultimate Disposition/Management     Case discussed with Dr. Amada Jupiter.  Suspicion for TIA however no significant benefit with admission given patient already being anticoagulated.  Unable to obtain MRI given patient's pacemaker status.  Plan is for CTA head and neck and if without acute process would be appropriate for discharge with close follow-up.  2:45 AM update: CTA is without acute pathology, patient continues to be at her baseline.  Appropriate for discharge.  Patient management required discussion with the following services or consulting groups:  Neurology  Complexity of Problems Addressed Acute illness or injury that poses threat of life of bodily function  Additional Data Reviewed and Analyzed Further history obtained from: Further history from spouse/family member  Additional Factors Impacting ED Encounter Risk Consideration of hospitalization  Elmer Sow. Pilar Plate, MD [facility] Emergency Medicine [facility] [email or web address]  Final Clinical Impressions(s) / ED Diagnoses     ICD-10-CM   1. TIA (transient ischemic attack)  G45.9 Ambulatory referral to Neurology      ED Discharge Orders          Ordered    Ambulatory referral to Neurology        Comments: An appointment is requested in approximately: 1 week   09/30/22 0252             Discharge Instructions Discussed with and Provided to Patient:  Discharge Instructions      You were evaluated in the Emergency Department and after careful evaluation, we did not find any emergent condition requiring admission or further testing in the hospital.  Symptoms may have been due to a transient ischemic attack or TIA.  Your CT imaging this evening was overall reassuring.  Continue your home medications and follow-up closely with neurology.  Please return to the Emergency Department if you experience any worsening of your condition.   Thank you for allowing [place] to be a part of your care.       Sabas Sous, MD 09/30/22 [phone]

## 2022-09-30 ENCOUNTER — Emergency Department (HOSPITAL_COMMUNITY): Payer: Medicare HMO

## 2022-09-30 ENCOUNTER — Telehealth: Payer: Self-pay | Admitting: Neurology

## 2022-09-30 LAB — DIFFERENTIAL
Abs Immature Granulocytes: 0.01 10*3/uL (ref 0.00–0.07)
Basophils Absolute: 0 10*3/uL (ref 0.0–0.1)
Basophils Relative: 0 %
Eosinophils Absolute: 0 10*3/uL (ref 0.0–0.5)
Eosinophils Relative: 1 %
Immature Granulocytes: 0 %
Lymphocytes Relative: 20 %
Lymphs Abs: 1.1 10*3/uL (ref 0.7–4.0)
Monocytes Absolute: 0.3 10*3/uL (ref 0.1–1.0)
Monocytes Relative: 5 %
Neutro Abs: 4 10*3/uL (ref 1.7–7.7)
Neutrophils Relative %: 74 %

## 2022-09-30 LAB — I-STAT CHEM 8, ED
BUN: 36 mg/dL — ABNORMAL HIGH (ref 8–23)
Calcium, Ion: 1.07 mmol/L — ABNORMAL LOW (ref 1.15–1.40)
Chloride: 102 mmol/L (ref 98–111)
Creatinine, Ser: 1.7 mg/dL — ABNORMAL HIGH (ref 0.44–1.00)
Glucose, Bld: 119 mg/dL — ABNORMAL HIGH (ref 70–99)
HCT: 34 % — ABNORMAL LOW (ref 36.0–46.0)
Hemoglobin: 11.6 g/dL — ABNORMAL LOW (ref 12.0–15.0)
Potassium: 4.4 mmol/L (ref 3.5–5.1)
Sodium: 138 mmol/L (ref 135–145)
TCO2: 29 mmol/L (ref 22–32)

## 2022-09-30 LAB — CBC
HCT: 32.3 % — ABNORMAL LOW (ref 36.0–46.0)
Hemoglobin: 10.4 g/dL — ABNORMAL LOW (ref 12.0–15.0)
MCH: 32.4 pg (ref 26.0–34.0)
MCHC: 32.2 g/dL (ref 30.0–36.0)
MCV: 100.6 fL — ABNORMAL HIGH (ref 80.0–100.0)
Platelets: 174 10*3/uL (ref 150–400)
RBC: 3.21 MIL/uL — ABNORMAL LOW (ref 3.87–5.11)
RDW: 15.6 % — ABNORMAL HIGH (ref 11.5–15.5)
WBC: 5.4 10*3/uL (ref 4.0–10.5)
nRBC: 0 % (ref 0.0–0.2)

## 2022-09-30 LAB — COMPREHENSIVE METABOLIC PANEL
ALT: 30 U/L (ref 0–44)
AST: 42 U/L — ABNORMAL HIGH (ref 15–41)
Albumin: 3.8 g/dL (ref 3.5–5.0)
Alkaline Phosphatase: 53 U/L (ref 38–126)
Anion gap: 16 — ABNORMAL HIGH (ref 5–15)
BUN: 28 mg/dL — ABNORMAL HIGH (ref 8–23)
CO2: 26 mmol/L (ref 22–32)
Calcium: 10 mg/dL (ref 8.9–10.3)
Chloride: 97 mmol/L — ABNORMAL LOW (ref 98–111)
Creatinine, Ser: 1.47 mg/dL — ABNORMAL HIGH (ref 0.44–1.00)
GFR, Estimated: 37 mL/min — ABNORMAL LOW (ref >60)
Glucose, Bld: 117 mg/dL — ABNORMAL HIGH (ref 70–99)
Potassium: 4 mmol/L (ref 3.5–5.1)
Sodium: 139 mmol/L (ref 135–145)
Total Bilirubin: 0.8 mg/dL (ref 0.3–1.2)
Total Protein: 8.6 g/dL — ABNORMAL HIGH (ref 6.5–8.1)

## 2022-09-30 LAB — ETHANOL: Alcohol, Ethyl (B): <10 mg/dL (ref <10)

## 2022-09-30 LAB — PROTIME-INR
INR: 1.6 — ABNORMAL HIGH (ref 0.8–1.2)
Prothrombin Time: 19.7 s — ABNORMAL HIGH (ref 11.4–15.2)

## 2022-09-30 LAB — APTT: aPTT: 35 s (ref 24–36)

## 2022-09-30 MED ORDER — IOHEXOL 350 MG/ML SOLN
75.0000 mL | Freq: Once | INTRAVENOUS | Status: AC | PRN
  Administered 2022-09-30: 75 mL via INTRAVENOUS

## 2022-09-30 NOTE — Telephone Encounter (Signed)
Caller stated her mother had to go to emergency room last night and Doctor recommended she follow up with Dr. Everlena Cooper as soon as possible

## 2022-09-30 NOTE — Discharge Instructions (Signed)
You were evaluated in the Emergency Department and after careful evaluation, we did not find any emergent condition requiring admission or further testing in the hospital.  Symptoms may have been due to a transient ischemic attack or TIA.  Your CT imaging this evening was overall reassuring.  Continue your home medications and follow-up closely with neurology.  Please return to the Emergency Department if you experience any worsening of your condition.   Thank you for allowing [place] to be a part of your care.

## 2022-09-30 NOTE — ED Notes (Signed)
Patient returned from CT to [facility].

## 2022-10-08 NOTE — Progress Notes (Unsigned)
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  Pending results, may start rivastigmine vs donepezil Continue olanzapine 2.5mg  at bedtime Follow up in 9 months.     Subjective:  Lindsey Huber is a 77 year old right-handed female with a fib with [employer] ICD, cardiomyopathy, HTN, CHF, lupus, pulmonary fibrosis, untreated OSA, s/p AVR and CKD who follows up for TIA.  ED notes reviewed.  She is accompanied by her daughter who supplements history.  CT head and CTA head and neck in ED personally reviewed.   UPDATE: Current medications:  Xarelto, Lopressor, Lasix, olanzapine 2.5mg  at bedtime  On 9/4, ***.  She was seen in the ED.  CT head revealed advanced chronic small vessel ischemic changes but no acute stroke.  CTA head and neck revealed tortuosity of the intracranial and extracranial arteries and mild to moderate atheromatous changes within the carotid bifurcations but no LVO or hemodynamically significant stenosis.  As she was already on maximal medical management and back to baseline, she was discharged.  DaT scan was ordered but nobody reached out to schedule it.    HISTORY:  On 07/01/2020, the patient experienced a mild headache off and on during the day.  She also had a left temporal headache and noted intermittent numbness and tingling in the bilateral lower extremities.  That evening, she was talking with family when she suddenly started holding the right side of her face, eyes became big and she started drooling.  When her family spoke to her, she could only moan.  She was unable to repeat.  She could not smile but did not exhibit facial  droop or unilateral weakness.  After 5-6 minutes, she started speaking but it was slurred and didn't make sense.  She was unable to repeat phrases.  She has no recollection of this.  No convulsions or incontinence  She was brought to the ED.  CTA of head and neck personally reviewed was negative for large vessel occlusion or hemodynamically significant stenosis.  Unable to have an MRI due to pacemaker.  She has a fib which is treated with Xarelto.  Routine awake and asleep EEG on 07/14/2020 was normal.   On the morning of 03/14/2021, she was noted by her daughter to be acting strange.  When she spoke, she had trouble putting words together and did not make sense.  After about an hour, she became unresponsive, started staring off, making moaning sounds and exhibited head twitching and shaking of both hands.  Lasted 2-3 minutes.  No incontinence or tongue biting.  She continued having some slurred speech and difficulty getting words out afterwards.  In hindsight, she says that she does remember feeling strange during this event and struggling to talk.  No lateralizing symptoms such as facial droop or unilateral limb weakness.  EMS was called and symptoms resolved en route to the ED.  2 days prior, she had a fall and landed on her left arm.  CT head personally reviewed revealed no acute intracranial abnormality.  MRI was unable to be performed because there were no [employer] reps that day who could come to the hospital to turn off the pacemaker.  She was found to have cellulitis  on her left medial forearm where she had fallen.  Labs revealed no leukocytes and electrolytes normal.  She was discharged on Keflex.     Beginning in 2022, her daughter has been concerned about dementia.  She has been having both visual and auditory hallucinations.  For example, she saw a large spider web in her house that she said was put up by somebody.   She sometimes hears people outside the window.  She also may exhibit paranoid  delusions such as thinking her cousin is calling into radio stations and talking about her, or that her conversations are being broadcast throughout the apartment complex.  This isn't a frequent occurrence.  She sometimes has tremors of the hands.  She lives with her granddaughter and her daughter stops by everyday.  She is able to perform ADLs independently.  No family history of dementia.  B12 from November 2022 was 1,406.  TSH from January 2023 was 1.470.  24 hour ambulatory EEG on 4/5-04/30/2021 was normal.  No events captured.  Unable to get MRI of brain because she has reported abandoned leads.  CT head on 06/18/2021 and  07/14/2021 showed moderate chronic small vessel ischemic changes in the cerebral white matter but no acute findings.  Neuropsychological evaluation on 08/12/2021 was consistent with major neurocognitive disorder possibly indicating early stages of Lewy body dementia or vascular dementia but not suggestive of Alzheimer's, FTD or Parkinson's/atypical parkinson's etiology.  PAST MEDICAL HISTORY: Past Medical History:  Diagnosis Date   Achalasia    Acquired hypothyroidism 08/20/2020   Acute metabolic encephalopathy 07/25/2021   AICD (automatic cardioverter/defibrillator) present 11/14/2019   Allergic rhinitis    Ankle fracture 03/25/2022   Aortic valve disorder 03/26/2002   Atherosclerosis of abdominal aorta (HCC) 05/01/2020   Cardiomyopathy (HCC)    CHB (complete heart block) (HCC) 08/18/2017   CHF (congestive heart failure) (HCC)    Cholelithiasis without obstruction 05/01/2020   Chronic gouty arthritis    Chronic heart failure with preserved ejection fraction (HCC) 05/01/2020   Chronic kidney disease (CKD), active medical management without dialysis, stage 3 (moderate) (HCC) 05/01/2020   Closed torus fracture of distal end of right radius with delayed healing 08/12/2021   Delusional thoughts (HCC)    Diverticulosis of colon 05/01/2020   Epistaxis    Essential (primary)  hypertension 08/18/2017   Gastroesophageal reflux disease 05/01/2020   Gastrointestinal hemorrhage    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 11/14/2019   Hypercoagulability due to atrial fibrillation (HCC) 05/01/2020   Hyperlipidemia 11/14/2019   Hypocalcemia 12/14/2020   Hypoglycemia    Hypokalemia 12/14/2020   Hypomagnesemia 12/14/2020   Hypotension 12/14/2020   Idiopathic pulmonary fibrosis (HCC) 05/01/2020   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   Iron deficiency anemia    Long term (current) use of anticoagulants    Malnutrition of mild degree [name]: 75% to less than 90% of standard weight) (HCC)    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/12/2021   Mitral and aortic incompetence 11/14/2019   Non-rheumatic atrial fibrillation (HCC) 05/01/2020   Non-toxic multinodular goiter 08/20/2020   NSVT (nonsustained ventricular tachycardia) (HCC) 08/18/2017   Oropharyngeal dysphagia    Osteoarthritis of hip 05/01/2020   Osteoarthritis of knee 05/01/2020   Osteopenia of neck of left femur    Pain of left hip joint 12/12/2017   Proteinuria 05/01/2020   Raynaud's disease    Recurrent falls 04/09/2021   Rheumatoid arthritis (HCC)    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     albuterol (VENTOLIN HFA) 108 (90 Base) MCG/ACT inhaler Inhale 2 puffs into the lungs every 6 (six) hours as needed for wheezing or shortness of breath.     allopurinol (ZYLOPRIM) 100 MG tablet TAKE 1 TABLET BY MOUTH EVERY DAY 90  tablet 1   amoxicillin (AMOXIL) 500 MG capsule 4 capsules by mouth 1 hour before dental procedure.     Budeson-Glycopyrrol-Formoterol (BREZTRI AEROSPHERE) 160-9-4.8 MCG/ACT AERO Inhale 2 puffs into the lungs 2 (two) times daily as needed ("for flares").     calcium-vitamin D (OSCAL WITH D) 500-5 MG-MCG tablet Take 2 tablets by mouth 2 (two) times daily. 120 tablet 0   cetirizine HCl (ZYRTEC) 5 MG/5ML SOLN 1 mL as needed Orally Twice a day for 30 day(s)     Cholecalciferol (VITAMIN D3) 25 MCG (1000 UT) CAPS Take 1,000 Units by mouth daily with lunch.     famotidine (PEPCID) 20 MG tablet 20 mg as needed for heartburn or indigestion (use at bedtime as needed for heartburn or indigestion).     fluticasone (FLONASE) 50 MCG/ACT nasal spray PLACE 1 SPRAY INTO BOTH NOSTRILS 2 (TWO) TIMES DAILY 48 mL 2   furosemide (LASIX) 40 MG tablet Take 1 tablet (40 mg total) by mouth daily. 180 tablet 3   Glycerin-Hypromellose-PEG 400 (DRY EYE RELIEF DROPS OP) Place 1 drop into both eyes in the morning.     hydroxychloroquine (PLAQUENIL) 200 MG tablet TAKE 1 TABLET BY MOUTH EVERY DAY 90 tablet 0   levothyroxine (SYNTHROID) 25 MCG tablet Take 1 tablet (25 mcg total) by mouth daily. 90 tablet 3   magnesium oxide (MAG-OX) 400 (240 Mg) MG tablet TAKE 1 TABLET BY MOUTH EVERY DAY 90 tablet 3   metoprolol tartrate (LOPRESSOR) 25 MG tablet Take 0.5 tablets (12.5 mg total) by mouth 2 (two) times daily. 30 tablet 0   Nintedanib (OFEV) 150 MG CAPS Take 150 mg by mouth at bedtime.     OLANZapine (ZYPREXA) 2.5 MG tablet TAKE 1 TABLET BY MOUTH EVERYDAY AT BEDTIME 90 tablet 0   pantoprazole (PROTONIX) 40 MG tablet Take 1 tablet (40 mg total) by mouth 2 (two) times daily. 60 tablet 0   polyethylene glycol powder (MIRALAX) 17 GM/SCOOP powder as directed Orally Once a day for 30 day(s)     potassium chloride (KLOR-CON M10) 10 MEQ tablet TAKE 2 TABLETS BY MOUTH DAILY 180 tablet 2   predniSONE (DELTASONE) 5 MG tablet TAKE 1 TABLET (5 MG  TOTAL) BY MOUTH DAILY WITH BREAKFAST. PATIENT TO DOUBLE UP DOSE DURING SICK DAY RULE 100 tablet 3   XARELTO 15 MG TABS tablet TAKE 1 TABLET (15 MG TOTAL) BY MOUTH DAILY WITH SUPPER 90 tablet 1   No current facility-administered medications on file prior to visit.    ALLERGIES: Allergies  Allergen Reactions   Other Other (See Comments)    Patient is to NOT EAT any foods with husks or tree nuts, popcorn   Propofol Shortness Of Breath and Other (See Comments)    "Caused asthma"   Flecainide  Other (See Comments)    Reaction not recalled   Amiodarone Other (See Comments)    Delirium/Confusion/Psychosis   Amlodipine Other (See Comments)    "Tired and syncope"    FAMILY HISTORY: Family History  Problem Relation Age of Onset   Heart disease Mother    Hypertension Mother    Rheum arthritis Mother    Osteoarthritis Mother    Heart disease Father    Hypertension Father    Osteoarthritis Father    Heart disease Sister    Diabetes Brother    Cancer Brother       Objective:  *** General: No acute distress.  Patient appears well-groomed.   Head:  Normocephalic/atraumatic Eyes:  Fundi examined but not visualized Neck: supple, no paraspinal tenderness, full range of motion Heart:  Regular rate and rhythm Neurological Exam: alert and oriented to person, place, and time.  Speech fluent and not dysarthric, language intact.  CN II-XII intact. Bulk and tone normal, muscle strength 5-/5 throughout.  Sensation to light touch intact.  Deep tendon reflexes 1+ throughout  Finger to nose testing intact.  In wheelchair.  Gait testing deferred. ***   Shon Millet, DO  CC: Evangeline Dakin, [name]

## 2022-10-11 ENCOUNTER — Ambulatory Visit (INDEPENDENT_AMBULATORY_CARE_PROVIDER_SITE_OTHER): Payer: Medicare HMO | Admitting: Neurology

## 2022-10-11 ENCOUNTER — Encounter: Payer: Self-pay | Admitting: Neurology

## 2022-10-11 VITALS — BP 134/75 | HR 84 | Ht 61.0 in | Wt 121.0 lb

## 2022-10-11 DIAGNOSIS — R251 Tremor, unspecified: Secondary | ICD-10-CM | POA: Diagnosis not present

## 2022-10-11 NOTE — Patient Instructions (Addendum)
DaT scan. Once approved, [employer] Scheduling will call to schedule.       Follow up 9 months.

## 2022-10-13 NOTE — Progress Notes (Signed)
Lindsey Huber. Pilar Plate, MD [facility] Emergency Medicine [facility] [email or web address]

## 2022-10-14 ENCOUNTER — Other Ambulatory Visit (HOSPITAL_COMMUNITY): Payer: Medicare HMO

## 2022-10-14 ENCOUNTER — Encounter (HOSPITAL_COMMUNITY): Payer: Medicare HMO | Admitting: Internal Medicine

## 2022-10-25 NOTE — Progress Notes (Deleted)
HEAD WITHOUT CONTRAST TECHNIQUE: Contiguous axial images were obtained from the base of the skull through the vertex without intravenous contrast. RADIATION DOSE REDUCTION: This exam was performed according to the departmental dose-optimization program which includes automated exposure control, adjustment of the mA and/or kV according to patient size and/or use of iterative reconstruction technique. COMPARISON:  CT head 07/25/2021 FINDINGS: Brain: No intracranial hemorrhage, mass effect, or evidence of acute infarct. No hydrocephalus. No extra-axial fluid collection. Age-commensurate cerebral atrophy. Advanced chronic small vessel ischemic disease. Physiologic basal ganglia calcifications. Vascular: No hyperdense vessel. Intracranial arterial calcification. Skull: No fracture or focal lesion. Sinuses/Orbits:  No acute finding. Other: Calcifications in the parotid glands may be due to Sjogren's disease. IMPRESSION: 1. No acute intracranial abnormality. 2. Advanced chronic small vessel ischemic disease. Electronically Signed   By: Minerva Fester M.D.   On: 09/30/2022 00:38   DG BONE DENSITY (DXA)  Result Date: 09/28/2022 EXAM: DUAL X-RAY ABSORPTIOMETRY (DXA) FOR BONE MINERAL DENSITY IMPRESSION: Referring Physician:  Deloria Lair MILLER Your patient completed a bone mineral density test using GE Lunar iDXA system (analysis version: 16). Technologist:     lmn PATIENT: Name: Lindsey Huber, Lindsey Huber Patient ID: [id number] Birth Date: September 28, 1945 Height: 61.5 in. Sex: Female Measured: 09/28/2022 Weight: 115.6 lbs. Indications: Advanced Age, COPD, Estrogen Deficient, Height Loss (781.91), History of Fracture (Adult) (V15.51), Hypothyroid, Levothyroxine, Long term steroid use, Low Body Weight (783.22), osteoarthritis, Pantoprazole, Plaquenil, Postmenopausal, Prednisone, Rheumatoid Arthritis (714.0) Fractures: Left Ankle, Right Ankle Treatments: Calcium (E943.0), Vitamin D (E933.5) ASSESSMENT: The BMD measured at Forearm Radius 33% is 0.632 g/cm2 with a T-score of -2.8. This patient is considered osteoporotic according to World Health Organization [facility]) criteria. The quality of the exam is limited by patient body habitus and condition. The lumbar spine was excluded due to degenerative changes. Site Region Measured Date Measured Age YA BMD Significant CHANGE T-score Left Forearm Radius 33% 09/28/2022 75.3 -2.8 0.632 g/cm2 DualFemur Neck Right 09/28/2022 75.3 -2.6 0.683 g/cm2 DualFemur Total Mean 09/28/2022 75.3 -2.1 0.742 g/cm2 World Health Organization [name]) criteria for post-menopausal, Caucasian Women: Normal       T-score at or above -1 SD Osteopenia   T-score between -1 and -2.5 SD Osteoporosis T-score at or below -2.5 SD RECOMMENDATION: 1. All patients should optimize calcium and vitamin D intake. 2. Consider FDA-approved medical  therapies in postmenopausal women and men aged 35 years and older, based on the following: a. A hip or vertebral (clinical or morphometric) fracture. b. T-score = -2.5 at the femoral neck or spine after appropriate evaluation to exclude secondary causes. c. Low bone mass (T-score between -1.0 and -2.5 at the femoral neck or spine) and a 10-year probability of a hip fracture = 3% or a 10-year probability of a major osteoporosis-related fracture = 20% based on the [place]-adapted WHO algorithm. d. Clinician judgment and/or patient preferences may indicate treatment for people with 10-year fracture probabilities above or below these levels. FOLLOW-UP: Patients with diagnosis of osteoporosis or at high risk for fracture should have regular bone mineral density tests. Patients eligible for Medicare are allowed routine testing every 2 years. The testing frequency can be increased to one year for patients who have rapidly progressing disease, are receiving or discontinuing medical therapy to restore bone mass, or have additional risk factors. I have reviewed this study and agree with the findings. [facility] Electronically Signed   By: Frederico Hamman M.D.   On: 09/28/2022 08:38    Recent Labs: Lab Results  Component Value  Office Visit Note  Patient: Lindsey Huber             Date of Birth: 12-07-1945           MRN: [id number]             PCP: Collene Mares, PA Referring: Collene Mares, [name] Visit Date: 11/08/2022   Subjective:  No chief complaint on file.   History of Present Illness: Lindsey Huber is a 77 y.o. female here for follow up for  SLE with related ILD, OA, and gouty arthritis on hydroxychloroquine 200 mg daily and prednisone 5 mg daily and allopurinol 100 mg daily.    Previous HPI 05/03/2022 Lindsey Huber is a 77 y.o. female here for follow up for  SLE with related ILD, OA, and gouty arthritis on hydroxychloroquine 200 mg daily and prednisone 5 mg daily and allopurinol 100 mg daily.  She had follow-up earlier today with [name] managing with Durel Salts currently tolerating the medication well.  Still having some joint pains frequently bothers her at the ankle and feet not seeing a lot of swelling in upper extremities.  Still gets Raynaud's symptoms without any associated lesions.     Previous HPI 10/26/21 Lindsey Huber is a 77 y.o. female here for follow up for SLE, OA, and gouty arthritis on HCQ 200 mg PO daily and prednisone 5 mg daily and allopurinol 100 mg daily. She has not suffered any flare up of joint pain and swelling since our last visit. No new falls. She has morning stiffness increases somewhat with colder temperatures but keeps home pretty warm. Fingertips turn blue sometimes with cold but no pallor, redness, no skin peeling or lesions.   Previous HPI 07/16/2021 Lindsey Huber is a 77 y.o. female here for follow up for SLE, OA, and gouty arthritis on HCQ 200 mg daily and prednisone 5 mg and allopurinol 100 mg daily. No major flare up with lupus symptoms no joint effusion. She has persistent mild pedal edema. She fell again in May struck her right temple area on she side of the car and also chest and shoulder pain. No major complications identified. She has not  yet started with PT for her weakness, pain, and gait difficulty.    Previous HPI 04/09/21 Lindsey Huber is a 77 y.o. female here for follow up for systemic lupus and gout on HCQ 200 mg daily and allopurinol 100 mg daily. She takes prednisone 5 mg daily as well for adrenal insufficiency. She has had multiple falls since last visit most recently about a week ago. This is most often from her leg typically right knee suddenly giving way without preceding signs or symptoms. She fell onto her right hip. Also fell recently landing on left elbow with residual bruising around that area. No new gout attacks or joint swelling besides from the bruising.   Previous HPI 01/05/21 Lindsey Huber is a 77 y.o. female here for follow up for SLE with inflammatory arthritis and gout on hydroxychloroquine 200 mg daily and allopurinol 100 mg. 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  Since our last visit she had a significant event with hospitalization for hematochezia work-up identified a diverticular bleed.  She did have sufficient anemia required blood transfusion otherwise no major complications.  She has had no major change in pulmonary symptoms no new skin rashes or swelling.  Does have mild pedal edema.  Currently she is experiencing some increased knee pain and  HEAD WITHOUT CONTRAST TECHNIQUE: Contiguous axial images were obtained from the base of the skull through the vertex without intravenous contrast. RADIATION DOSE REDUCTION: This exam was performed according to the departmental dose-optimization program which includes automated exposure control, adjustment of the mA and/or kV according to patient size and/or use of iterative reconstruction technique. COMPARISON:  CT head 07/25/2021 FINDINGS: Brain: No intracranial hemorrhage, mass effect, or evidence of acute infarct. No hydrocephalus. No extra-axial fluid collection. Age-commensurate cerebral atrophy. Advanced chronic small vessel ischemic disease. Physiologic basal ganglia calcifications. Vascular: No hyperdense vessel. Intracranial arterial calcification. Skull: No fracture or focal lesion. Sinuses/Orbits:  No acute finding. Other: Calcifications in the parotid glands may be due to Sjogren's disease. IMPRESSION: 1. No acute intracranial abnormality. 2. Advanced chronic small vessel ischemic disease. Electronically Signed   By: Minerva Fester M.D.   On: 09/30/2022 00:38   DG BONE DENSITY (DXA)  Result Date: 09/28/2022 EXAM: DUAL X-RAY ABSORPTIOMETRY (DXA) FOR BONE MINERAL DENSITY IMPRESSION: Referring Physician:  Deloria Lair MILLER Your patient completed a bone mineral density test using GE Lunar iDXA system (analysis version: 16). Technologist:     lmn PATIENT: Name: Lindsey Huber, Lindsey Huber Patient ID: [id number] Birth Date: September 28, 1945 Height: 61.5 in. Sex: Female Measured: 09/28/2022 Weight: 115.6 lbs. Indications: Advanced Age, COPD, Estrogen Deficient, Height Loss (781.91), History of Fracture (Adult) (V15.51), Hypothyroid, Levothyroxine, Long term steroid use, Low Body Weight (783.22), osteoarthritis, Pantoprazole, Plaquenil, Postmenopausal, Prednisone, Rheumatoid Arthritis (714.0) Fractures: Left Ankle, Right Ankle Treatments: Calcium (E943.0), Vitamin D (E933.5) ASSESSMENT: The BMD measured at Forearm Radius 33% is 0.632 g/cm2 with a T-score of -2.8. This patient is considered osteoporotic according to World Health Organization [facility]) criteria. The quality of the exam is limited by patient body habitus and condition. The lumbar spine was excluded due to degenerative changes. Site Region Measured Date Measured Age YA BMD Significant CHANGE T-score Left Forearm Radius 33% 09/28/2022 75.3 -2.8 0.632 g/cm2 DualFemur Neck Right 09/28/2022 75.3 -2.6 0.683 g/cm2 DualFemur Total Mean 09/28/2022 75.3 -2.1 0.742 g/cm2 World Health Organization [name]) criteria for post-menopausal, Caucasian Women: Normal       T-score at or above -1 SD Osteopenia   T-score between -1 and -2.5 SD Osteoporosis T-score at or below -2.5 SD RECOMMENDATION: 1. All patients should optimize calcium and vitamin D intake. 2. Consider FDA-approved medical  therapies in postmenopausal women and men aged 35 years and older, based on the following: a. A hip or vertebral (clinical or morphometric) fracture. b. T-score = -2.5 at the femoral neck or spine after appropriate evaluation to exclude secondary causes. c. Low bone mass (T-score between -1.0 and -2.5 at the femoral neck or spine) and a 10-year probability of a hip fracture = 3% or a 10-year probability of a major osteoporosis-related fracture = 20% based on the [place]-adapted WHO algorithm. d. Clinician judgment and/or patient preferences may indicate treatment for people with 10-year fracture probabilities above or below these levels. FOLLOW-UP: Patients with diagnosis of osteoporosis or at high risk for fracture should have regular bone mineral density tests. Patients eligible for Medicare are allowed routine testing every 2 years. The testing frequency can be increased to one year for patients who have rapidly progressing disease, are receiving or discontinuing medical therapy to restore bone mass, or have additional risk factors. I have reviewed this study and agree with the findings. [facility] Electronically Signed   By: Frederico Hamman M.D.   On: 09/28/2022 08:38    Recent Labs: Lab Results  Component Value  Office Visit Note  Patient: Lindsey Huber             Date of Birth: 12-07-1945           MRN: [id number]             PCP: Collene Mares, PA Referring: Collene Mares, [name] Visit Date: 11/08/2022   Subjective:  No chief complaint on file.   History of Present Illness: Lindsey Huber is a 77 y.o. female here for follow up for  SLE with related ILD, OA, and gouty arthritis on hydroxychloroquine 200 mg daily and prednisone 5 mg daily and allopurinol 100 mg daily.    Previous HPI 05/03/2022 Lindsey Huber is a 77 y.o. female here for follow up for  SLE with related ILD, OA, and gouty arthritis on hydroxychloroquine 200 mg daily and prednisone 5 mg daily and allopurinol 100 mg daily.  She had follow-up earlier today with [name] managing with Durel Salts currently tolerating the medication well.  Still having some joint pains frequently bothers her at the ankle and feet not seeing a lot of swelling in upper extremities.  Still gets Raynaud's symptoms without any associated lesions.     Previous HPI 10/26/21 Lindsey Huber is a 77 y.o. female here for follow up for SLE, OA, and gouty arthritis on HCQ 200 mg PO daily and prednisone 5 mg daily and allopurinol 100 mg daily. She has not suffered any flare up of joint pain and swelling since our last visit. No new falls. She has morning stiffness increases somewhat with colder temperatures but keeps home pretty warm. Fingertips turn blue sometimes with cold but no pallor, redness, no skin peeling or lesions.   Previous HPI 07/16/2021 Lindsey Huber is a 77 y.o. female here for follow up for SLE, OA, and gouty arthritis on HCQ 200 mg daily and prednisone 5 mg and allopurinol 100 mg daily. No major flare up with lupus symptoms no joint effusion. She has persistent mild pedal edema. She fell again in May struck her right temple area on she side of the car and also chest and shoulder pain. No major complications identified. She has not  yet started with PT for her weakness, pain, and gait difficulty.    Previous HPI 04/09/21 Lindsey Huber is a 77 y.o. female here for follow up for systemic lupus and gout on HCQ 200 mg daily and allopurinol 100 mg daily. She takes prednisone 5 mg daily as well for adrenal insufficiency. She has had multiple falls since last visit most recently about a week ago. This is most often from her leg typically right knee suddenly giving way without preceding signs or symptoms. She fell onto her right hip. Also fell recently landing on left elbow with residual bruising around that area. No new gout attacks or joint swelling besides from the bruising.   Previous HPI 01/05/21 Lindsey Huber is a 77 y.o. female here for follow up for SLE with inflammatory arthritis and gout on hydroxychloroquine 200 mg daily and allopurinol 100 mg. 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  Since our last visit she had a significant event with hospitalization for hematochezia work-up identified a diverticular bleed.  She did have sufficient anemia required blood transfusion otherwise no major complications.  She has had no major change in pulmonary symptoms no new skin rashes or swelling.  Does have mild pedal edema.  Currently she is experiencing some increased knee pain and  Office Visit Note  Patient: Lindsey Huber             Date of Birth: 12-07-1945           MRN: [id number]             PCP: Collene Mares, PA Referring: Collene Mares, [name] Visit Date: 11/08/2022   Subjective:  No chief complaint on file.   History of Present Illness: Lindsey Huber is a 77 y.o. female here for follow up for  SLE with related ILD, OA, and gouty arthritis on hydroxychloroquine 200 mg daily and prednisone 5 mg daily and allopurinol 100 mg daily.    Previous HPI 05/03/2022 Lindsey Huber is a 77 y.o. female here for follow up for  SLE with related ILD, OA, and gouty arthritis on hydroxychloroquine 200 mg daily and prednisone 5 mg daily and allopurinol 100 mg daily.  She had follow-up earlier today with [name] managing with Durel Salts currently tolerating the medication well.  Still having some joint pains frequently bothers her at the ankle and feet not seeing a lot of swelling in upper extremities.  Still gets Raynaud's symptoms without any associated lesions.     Previous HPI 10/26/21 Lindsey Huber is a 77 y.o. female here for follow up for SLE, OA, and gouty arthritis on HCQ 200 mg PO daily and prednisone 5 mg daily and allopurinol 100 mg daily. She has not suffered any flare up of joint pain and swelling since our last visit. No new falls. She has morning stiffness increases somewhat with colder temperatures but keeps home pretty warm. Fingertips turn blue sometimes with cold but no pallor, redness, no skin peeling or lesions.   Previous HPI 07/16/2021 Lindsey Huber is a 77 y.o. female here for follow up for SLE, OA, and gouty arthritis on HCQ 200 mg daily and prednisone 5 mg and allopurinol 100 mg daily. No major flare up with lupus symptoms no joint effusion. She has persistent mild pedal edema. She fell again in May struck her right temple area on she side of the car and also chest and shoulder pain. No major complications identified. She has not  yet started with PT for her weakness, pain, and gait difficulty.    Previous HPI 04/09/21 Lindsey Huber is a 77 y.o. female here for follow up for systemic lupus and gout on HCQ 200 mg daily and allopurinol 100 mg daily. She takes prednisone 5 mg daily as well for adrenal insufficiency. She has had multiple falls since last visit most recently about a week ago. This is most often from her leg typically right knee suddenly giving way without preceding signs or symptoms. She fell onto her right hip. Also fell recently landing on left elbow with residual bruising around that area. No new gout attacks or joint swelling besides from the bruising.   Previous HPI 01/05/21 Lindsey Huber is a 77 y.o. female here for follow up for SLE with inflammatory arthritis and gout on hydroxychloroquine 200 mg daily and allopurinol 100 mg. 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  Since our last visit she had a significant event with hospitalization for hematochezia work-up identified a diverticular bleed.  She did have sufficient anemia required blood transfusion otherwise no major complications.  She has had no major change in pulmonary symptoms no new skin rashes or swelling.  Does have mild pedal edema.  Currently she is experiencing some increased knee pain and  Office Visit Note  Patient: Lindsey Huber             Date of Birth: 12-07-1945           MRN: [id number]             PCP: Collene Mares, PA Referring: Collene Mares, [name] Visit Date: 11/08/2022   Subjective:  No chief complaint on file.   History of Present Illness: Lindsey Huber is a 77 y.o. female here for follow up for  SLE with related ILD, OA, and gouty arthritis on hydroxychloroquine 200 mg daily and prednisone 5 mg daily and allopurinol 100 mg daily.    Previous HPI 05/03/2022 Lindsey Huber is a 77 y.o. female here for follow up for  SLE with related ILD, OA, and gouty arthritis on hydroxychloroquine 200 mg daily and prednisone 5 mg daily and allopurinol 100 mg daily.  She had follow-up earlier today with [name] managing with Durel Salts currently tolerating the medication well.  Still having some joint pains frequently bothers her at the ankle and feet not seeing a lot of swelling in upper extremities.  Still gets Raynaud's symptoms without any associated lesions.     Previous HPI 10/26/21 Lindsey Huber is a 77 y.o. female here for follow up for SLE, OA, and gouty arthritis on HCQ 200 mg PO daily and prednisone 5 mg daily and allopurinol 100 mg daily. She has not suffered any flare up of joint pain and swelling since our last visit. No new falls. She has morning stiffness increases somewhat with colder temperatures but keeps home pretty warm. Fingertips turn blue sometimes with cold but no pallor, redness, no skin peeling or lesions.   Previous HPI 07/16/2021 Lindsey Huber is a 77 y.o. female here for follow up for SLE, OA, and gouty arthritis on HCQ 200 mg daily and prednisone 5 mg and allopurinol 100 mg daily. No major flare up with lupus symptoms no joint effusion. She has persistent mild pedal edema. She fell again in May struck her right temple area on she side of the car and also chest and shoulder pain. No major complications identified. She has not  yet started with PT for her weakness, pain, and gait difficulty.    Previous HPI 04/09/21 Lindsey Huber is a 77 y.o. female here for follow up for systemic lupus and gout on HCQ 200 mg daily and allopurinol 100 mg daily. She takes prednisone 5 mg daily as well for adrenal insufficiency. She has had multiple falls since last visit most recently about a week ago. This is most often from her leg typically right knee suddenly giving way without preceding signs or symptoms. She fell onto her right hip. Also fell recently landing on left elbow with residual bruising around that area. No new gout attacks or joint swelling besides from the bruising.   Previous HPI 01/05/21 Lindsey Huber is a 77 y.o. female here for follow up for SLE with inflammatory arthritis and gout on hydroxychloroquine 200 mg daily and allopurinol 100 mg. 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  Since our last visit she had a significant event with hospitalization for hematochezia work-up identified a diverticular bleed.  She did have sufficient anemia required blood transfusion otherwise no major complications.  She has had no major change in pulmonary symptoms no new skin rashes or swelling.  Does have mild pedal edema.  Currently she is experiencing some increased knee pain and

## 2022-10-26 ENCOUNTER — Ambulatory Visit (HOSPITAL_COMMUNITY)
Admission: RE | Admit: 2022-10-26 | Discharge: 2022-10-26 | Disposition: A | Discharge status: Home / Self Care | Payer: Medicare HMO | Source: Ambulatory Visit | Attending: Internal Medicine | Admitting: Internal Medicine

## 2022-10-26 ENCOUNTER — Emergency Department (HOSPITAL_COMMUNITY): Payer: Medicare HMO

## 2022-10-26 ENCOUNTER — Other Ambulatory Visit: Payer: Self-pay

## 2022-10-26 ENCOUNTER — Observation Stay (HOSPITAL_COMMUNITY)
Admission: EM | Admit: 2022-10-26 | Discharge: 2022-10-27 | Disposition: A | Discharge status: Home / Self Care | Payer: Medicare HMO | Attending: Internal Medicine | Admitting: Internal Medicine

## 2022-10-26 ENCOUNTER — Encounter (HOSPITAL_COMMUNITY): Payer: Self-pay | Admitting: Internal Medicine

## 2022-10-26 ENCOUNTER — Ambulatory Visit (HOSPITAL_BASED_OUTPATIENT_CLINIC_OR_DEPARTMENT_OTHER)
Admission: RE | Admit: 2022-10-26 | Discharge: 2022-10-26 | Disposition: A | Discharge status: Home / Self Care | Payer: Medicare HMO | Source: Ambulatory Visit | Attending: Internal Medicine | Admitting: Internal Medicine

## 2022-10-26 VITALS — BP 150/92 | HR 70

## 2022-10-26 DIAGNOSIS — Z7901 Long term (current) use of anticoagulants: Secondary | ICD-10-CM | POA: Diagnosis not present

## 2022-10-26 DIAGNOSIS — N1831 Chronic kidney disease, stage 3a: Secondary | ICD-10-CM | POA: Diagnosis not present

## 2022-10-26 DIAGNOSIS — Z79899 Other long term (current) drug therapy: Secondary | ICD-10-CM | POA: Diagnosis not present

## 2022-10-26 DIAGNOSIS — Z8673 Personal history of transient ischemic attack (TIA), and cerebral infarction without residual deficits: Secondary | ICD-10-CM | POA: Insufficient documentation

## 2022-10-26 DIAGNOSIS — R4182 Altered mental status, unspecified: Secondary | ICD-10-CM | POA: Diagnosis present

## 2022-10-26 DIAGNOSIS — R569 Unspecified convulsions: Secondary | ICD-10-CM | POA: Diagnosis not present

## 2022-10-26 DIAGNOSIS — I360 Nonrheumatic tricuspid (valve) stenosis: Secondary | ICD-10-CM | POA: Insufficient documentation

## 2022-10-26 DIAGNOSIS — Z95810 Presence of automatic (implantable) cardiac defibrillator: Secondary | ICD-10-CM | POA: Insufficient documentation

## 2022-10-26 DIAGNOSIS — I5030 Unspecified diastolic (congestive) heart failure: Secondary | ICD-10-CM | POA: Insufficient documentation

## 2022-10-26 DIAGNOSIS — Z7722 Contact with and (suspected) exposure to environmental tobacco smoke (acute) (chronic): Secondary | ICD-10-CM | POA: Insufficient documentation

## 2022-10-26 DIAGNOSIS — I5032 Chronic diastolic (congestive) heart failure: Secondary | ICD-10-CM

## 2022-10-26 DIAGNOSIS — J449 Chronic obstructive pulmonary disease, unspecified: Secondary | ICD-10-CM | POA: Insufficient documentation

## 2022-10-26 DIAGNOSIS — Z953 Presence of xenogenic heart valve: Secondary | ICD-10-CM | POA: Insufficient documentation

## 2022-10-26 DIAGNOSIS — G459 Transient cerebral ischemic attack, unspecified: Secondary | ICD-10-CM | POA: Insufficient documentation

## 2022-10-26 DIAGNOSIS — F03A0 Unspecified dementia, mild, without behavioral disturbance, psychotic disturbance, mood disturbance, and anxiety: Secondary | ICD-10-CM | POA: Insufficient documentation

## 2022-10-26 DIAGNOSIS — E039 Hypothyroidism, unspecified: Secondary | ICD-10-CM | POA: Diagnosis not present

## 2022-10-26 DIAGNOSIS — R2990 Unspecified symptoms and signs involving the nervous system: Principal | ICD-10-CM

## 2022-10-26 DIAGNOSIS — I4891 Unspecified atrial fibrillation: Secondary | ICD-10-CM | POA: Diagnosis not present

## 2022-10-26 DIAGNOSIS — I739 Peripheral vascular disease, unspecified: Secondary | ICD-10-CM | POA: Insufficient documentation

## 2022-10-26 LAB — DIFFERENTIAL
Abs Immature Granulocytes: 0.03 10*3/uL (ref 0.00–0.07)
Basophils Absolute: 0 10*3/uL (ref 0.0–0.1)
Basophils Relative: 0 %
Eosinophils Absolute: 0.1 10*3/uL (ref 0.0–0.5)
Eosinophils Relative: 1 %
Immature Granulocytes: 0 %
Lymphocytes Relative: 17 %
Lymphs Abs: 1.4 10*3/uL (ref 0.7–4.0)
Monocytes Absolute: 0.5 10*3/uL (ref 0.1–1.0)
Monocytes Relative: 6 %
Neutro Abs: 6.6 10*3/uL (ref 1.7–7.7)
Neutrophils Relative %: 76 %

## 2022-10-26 LAB — URINALYSIS, ROUTINE W REFLEX MICROSCOPIC
Bilirubin Urine: NEGATIVE
Glucose, UA: NEGATIVE mg/dL
Hgb urine dipstick: NEGATIVE
Ketones, ur: NEGATIVE mg/dL
Leukocytes,Ua: NEGATIVE
Nitrite: NEGATIVE
Protein, ur: NEGATIVE mg/dL
Specific Gravity, Urine: 1.004 — ABNORMAL LOW (ref 1.005–1.030)
pH: 7 (ref 5.0–8.0)

## 2022-10-26 LAB — ECHOCARDIOGRAM COMPLETE
AR max vel: 0.81 cm2
AV Area VTI: 0.83 cm2
AV Area mean vel: 0.83 cm2
AV Mean grad: 32 mm[Hg]
AV Peak grad: 57.8 mm[Hg]
Ao pk vel: 3.8 m/s
Area-P 1/2: 2.29 cm2
MV VTI: 1.7 cm2
S' Lateral: 2.6 cm

## 2022-10-26 LAB — COMPREHENSIVE METABOLIC PANEL
ALT: 55 U/L — ABNORMAL HIGH (ref 0–44)
AST: 57 U/L — ABNORMAL HIGH (ref 15–41)
Albumin: 3.4 g/dL — ABNORMAL LOW (ref 3.5–5.0)
Alkaline Phosphatase: 145 U/L — ABNORMAL HIGH (ref 38–126)
Anion gap: 13 (ref 5–15)
BUN: 25 mg/dL — ABNORMAL HIGH (ref 8–23)
CO2: 25 mmol/L (ref 22–32)
Calcium: 9.4 mg/dL (ref 8.9–10.3)
Chloride: 98 mmol/L (ref 98–111)
Creatinine, Ser: 1.37 mg/dL — ABNORMAL HIGH (ref 0.44–1.00)
GFR, Estimated: 40 mL/min — ABNORMAL LOW (ref >60)
Glucose, Bld: 103 mg/dL — ABNORMAL HIGH (ref 70–99)
Potassium: 3.5 mmol/L (ref 3.5–5.1)
Sodium: 136 mmol/L (ref 135–145)
Total Bilirubin: 0.8 mg/dL (ref 0.3–1.2)
Total Protein: 7.9 g/dL (ref 6.5–8.1)

## 2022-10-26 LAB — I-STAT CHEM 8, ED
BUN: 26 mg/dL — ABNORMAL HIGH (ref 8–23)
Calcium, Ion: 1.19 mmol/L (ref 1.15–1.40)
Chloride: 100 mmol/L (ref 98–111)
Creatinine, Ser: 1.4 mg/dL — ABNORMAL HIGH (ref 0.44–1.00)
Glucose, Bld: 99 mg/dL (ref 70–99)
HCT: 36 % (ref 36.0–46.0)
Hemoglobin: 12.2 g/dL (ref 12.0–15.0)
Potassium: 3.6 mmol/L (ref 3.5–5.1)
Sodium: 140 mmol/L (ref 135–145)
TCO2: 27 mmol/L (ref 22–32)

## 2022-10-26 LAB — APTT: aPTT: 36 s (ref 24–36)

## 2022-10-26 LAB — ETHANOL: Alcohol, Ethyl (B): <10 mg/dL (ref <10)

## 2022-10-26 LAB — CBC
HCT: 34.2 % — ABNORMAL LOW (ref 36.0–46.0)
Hemoglobin: 11 g/dL — ABNORMAL LOW (ref 12.0–15.0)
MCH: 31.7 pg (ref 26.0–34.0)
MCHC: 32.2 g/dL (ref 30.0–36.0)
MCV: 98.6 fL (ref 80.0–100.0)
Platelets: 197 10*3/uL (ref 150–400)
RBC: 3.47 MIL/uL — ABNORMAL LOW (ref 3.87–5.11)
RDW: 14.4 % (ref 11.5–15.5)
WBC: 8.6 10*3/uL (ref 4.0–10.5)
nRBC: 0 % (ref 0.0–0.2)

## 2022-10-26 LAB — RAPID URINE DRUG SCREEN, HOSP PERFORMED
Amphetamines: NOT DETECTED
Barbiturates: NOT DETECTED
Benzodiazepines: NOT DETECTED
Cocaine: NOT DETECTED
Opiates: NOT DETECTED
Tetrahydrocannabinol: NOT DETECTED

## 2022-10-26 LAB — PROTIME-INR
INR: 1.9 — ABNORMAL HIGH (ref 0.8–1.2)
Prothrombin Time: 22.4 s — ABNORMAL HIGH (ref 11.4–15.2)

## 2022-10-26 LAB — CBG MONITORING, ED: Glucose-Capillary: 88 mg/dL (ref 70–99)

## 2022-10-26 MED ORDER — METOPROLOL SUCCINATE ER 25 MG PO TB24
25.0000 mg | ORAL_TABLET | Freq: Every day | ORAL | Status: DC
  Administered 2022-10-27: 25 mg via ORAL
  Filled 2022-10-26: qty 1

## 2022-10-26 MED ORDER — PREDNISONE 5 MG PO TABS
5.0000 mg | ORAL_TABLET | Freq: Every day | ORAL | Status: DC
  Administered 2022-10-27: 5 mg via ORAL
  Filled 2022-10-26: qty 1

## 2022-10-26 MED ORDER — ALBUTEROL SULFATE (2.5 MG/3ML) 0.083% IN NEBU: 2.5000 mg | INHALATION_SOLUTION | Freq: Four times a day (QID) | RESPIRATORY_TRACT | Status: DC | PRN

## 2022-10-26 MED ORDER — ACETAMINOPHEN 500 MG PO TABS: 1000.0000 mg | ORAL_TABLET | Freq: Four times a day (QID) | ORAL | Status: DC | PRN

## 2022-10-26 MED ORDER — SODIUM CHLORIDE 0.9% FLUSH
3.0000 mL | Freq: Two times a day (BID) | INTRAVENOUS | Status: DC
  Administered 2022-10-26 – 2022-10-27 (×2): 3 mL via INTRAVENOUS

## 2022-10-26 MED ORDER — SENNOSIDES-DOCUSATE SODIUM 8.6-50 MG PO TABS: 1.0000 | ORAL_TABLET | Freq: Every evening | ORAL | Status: DC | PRN

## 2022-10-26 MED ORDER — LEVETIRACETAM 500 MG PO TABS
500.0000 mg | ORAL_TABLET | Freq: Two times a day (BID) | ORAL | Status: DC
  Administered 2022-10-27: 500 mg via ORAL
  Filled 2022-10-26: qty 1

## 2022-10-26 MED ORDER — LEVOTHYROXINE SODIUM 25 MCG PO TABS
25.0000 ug | ORAL_TABLET | Freq: Every day | ORAL | Status: DC
  Administered 2022-10-27: 25 ug via ORAL
  Filled 2022-10-26: qty 1

## 2022-10-26 MED ORDER — RIVAROXABAN 15 MG PO TABS
15.0000 mg | ORAL_TABLET | Freq: Every day | ORAL | Status: DC
  Administered 2022-10-26: 15 mg via ORAL
  Filled 2022-10-26: qty 1

## 2022-10-26 MED ORDER — SODIUM CHLORIDE 0.9 % IV SOLN
2000.0000 mg | Freq: Once | INTRAVENOUS | Status: AC
  Administered 2022-10-26: 2000 mg via INTRAVENOUS
  Filled 2022-10-26: qty 20

## 2022-10-26 MED ORDER — PANTOPRAZOLE SODIUM 40 MG PO TBEC
40.0000 mg | DELAYED_RELEASE_TABLET | Freq: Every day | ORAL | Status: DC
  Administered 2022-10-27: 40 mg via ORAL
  Filled 2022-10-26: qty 1

## 2022-10-26 MED ORDER — HYDROXYCHLOROQUINE SULFATE 200 MG PO TABS
200.0000 mg | ORAL_TABLET | Freq: Every day | ORAL | Status: DC
  Administered 2022-10-27: 200 mg via ORAL
  Filled 2022-10-26: qty 1

## 2022-10-26 NOTE — ED Notes (Signed)
Pt is currently in CT.

## 2022-10-26 NOTE — Progress Notes (Signed)
EEG complete - results pending 

## 2022-10-26 NOTE — Procedures (Signed)
Patient Name: Lindsey Huber  MRN: [id number]  Epilepsy Attending: Charlsie Quest  Referring Physician/Provider: Caryl Pina, MD  Date: 10/26/2022 Duration: 22.44 mins  Patient history: 77 yr old female being seen today (10/26/2022) at [facility]. At 15:15 today, she had a witnessed "staring into space" spell followed by difficulty speaking. EEG to evaluate for seizure  Level of alertness: Awake, drowsy  AEDs during EEG study: None  Technical aspects: This EEG study was done with scalp electrodes positioned according to the 10-20 International system of electrode placement. Electrical activity was reviewed with band pass filter of 1-70Hz , sensitivity of 7 uV/mm, display speed of 22mm/sec with a 60Hz  notched filter applied as appropriate. EEG data were recorded continuously and digitally stored.  Video monitoring was available and reviewed as appropriate.  Description: The posterior dominant rhythm consists of 9 Hz activity of moderate voltage (25-35 uV) seen predominantly in posterior head regions, symmetric and reactive to eye opening and eye closing. Drowsiness was characterized by attenuation of the posterior background rhythm. EEG showed continuous 3 to 6 Hz theta-delta slowing in left frontotemporal region. Sharp waves were noted in left anterior temporal region. Physiologic photic driving was not seen during photic stimulation.  Hyperventilation was not performed.     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 Additionally there is cortical dysfunction arising from left frontotemporal region. No seizures were seen throughout the recording.  Dr. Otelia Limes was notified.      Lindsey Huber

## 2022-10-26 NOTE — ED Provider Notes (Signed)
[place] EMERGENCY DEPARTMENT AT [facility] Provider Note   CSN: [id number] Arrival date & time: 10/26/22  1530  An emergency department physician performed an initial assessment on this suspected stroke patient at 1534.  History Chief Complaint  Patient presents with   Stroke Symptoms    HPI Lindsey Huber is a 77 y.o. female presenting for chief complaint of altered mental status.  Per bedside nurse she was last known well 20 minutes ago.  She is on Xarelto has a history of TIAs per the family member at bedside.  She suddenly became altered and had a left-sided facial droop and was sent down from the clinic she was being evaluated in. Patient still unable to speak clearly..   Patient's recorded medical, surgical, social, medication list and allergies were reviewed in the Snapshot window as part of the initial history.   Review of Systems   Review of Systems  Unable to perform ROS: Patient nonverbal    Physical Exam Updated Vital Signs BP 122/78   Pulse 70   Temp 97.7 F (36.5 C) (Oral)   Resp 14   Ht 5\' 3"  (1.6 m)   Wt 54 kg   SpO2 100%   BMI 21.08 kg/m  Physical Exam Constitutional:      General: She is not in acute distress.    Appearance: She is not ill-appearing or toxic-appearing.  HENT:     Head: Normocephalic and atraumatic.  Eyes:     Extraocular Movements: Extraocular movements intact.     Pupils: Pupils are equal, round, and reactive to light.  Cardiovascular:     Rate and Rhythm: Normal rate.  Pulmonary:     Effort: No respiratory distress.  Abdominal:     General: Abdomen is flat.  Musculoskeletal:        General: No swelling, deformity or signs of injury.     Cervical back: Normal range of motion. No rigidity.  Skin:    General: Skin is warm and dry.  Neurological:     Mental Status: She is alert.     Comments: Agree with exam per neuro.  Psychiatric:        Mood and Affect: Mood normal.      ED Course/ Medical Decision  Making/ A&P Clinical Course as of 10/26/22 2033  Tue Oct 26, 2022  1548 I-stat chem 8, ED(!) [ME]    Clinical Course User Index [ME] Haywood Filler D    Procedures .Critical Care  Performed by: Glyn Ade, MD Authorized by: Glyn Ade, MD   Critical care provider statement:    Critical care time (minutes):  30   Critical care was necessary to treat or prevent imminent or life-threatening deterioration of the following conditions:  CNS failure or compromise   Critical care was time spent personally by me on the following activities:  Development of treatment plan with patient or surrogate, discussions with consultants, evaluation of patient's response to treatment, examination of patient, ordering and review of laboratory studies, ordering and review of radiographic studies, ordering and performing treatments and interventions, pulse oximetry, re-evaluation of patient's condition and review of old charts   Care discussed with: admitting provider      Medications Ordered in ED Medications  levETIRAcetam (KEPPRA) 2,000 mg in sodium chloride 0.9 % 250 mL IVPB (has no administration in time range)  levETIRAcetam (KEPPRA) tablet 500 mg (has no administration in time range)   Medical Decision Making:    Lindsey Huber is a 77 y.o.  female  who presented to the ED today with aphasia.  Due to these symptoms, nursing activated a CODE STROKE per hospital protocol.   Handoff received from EMS.  Patient placed on continuous vitals and telemetry monitoring while in ED which was reviewed periodically.   On my initial exam, the pt was in no acute distress, glucose was WNL and deficits include aphasia.  Deficits are  persistent.   Reviewed and confirmed nursing documentation for past medical history, family history, social history.   Notably an effort was made to reach out to contacts regarding the following historical factors concerning contraindications for TPA: Not a candidate  2/2 anticoagulation.     Initial Assessment and Plan:   Patient immediately evaluated jointly by teleneurology and emergency department providers.   This is most consistent with an acute life/limb threatening illness complicated by underlying chronic conditions.  Patient evaluated per code stroke protocol with immediate cross-sectional imaging of the head via CT head to evaluate for intracranial hemorrhage.  Per neurology, these rapid studies revealed no acute pathology. Neurology feels that patient's presentation is more consistent with alternative pathology given these findings.  Differential includes metabolic encephalopathy, medication encephalopathy, infectious encephalopathy. 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  Initial Study Results: Labs Labs reviewed without evidence of clinically relevant abnormality.   EKG EKG was reviewed independently. Rate, rhythm, axis, intervals all examined and without medically relevant abnormality. ST segments without concerns for elevations.    Radiology  Images reviewed independently, agree with radiology report at this time.   CT HEAD CODE STROKE WO CONTRAST  Final Result        Final Assessment and Plan:   No acute distress on reassessment after 5 hours of observation in the emergency room.  Arrange for admission to medicine for ongoing care and management. Disposition:   Based on the above findings, I believe this patient is stable for admission.    Patient/family educated about specific findings on our evaluation and explained exact reasons for admission.  Patient/family educated about clinical situation and time was allowed to answer questions.   Admission team communicated with and agreed with need for admission. Patient admitted. Patient ready to move at this time.     Emergency Department Medication Summary:    Medications  levETIRAcetam (KEPPRA) 2,000 mg in sodium chloride 0.9 % 250 mL IVPB (has no administration in time range)  levETIRAcetam (KEPPRA) tablet 500 mg (has no administration in time range)         Clinical Impression:  1. Stroke-like symptoms      Admit   Final Clinical Impression(s) / ED Diagnoses Final diagnoses:  Stroke-like symptoms    Rx / DC Orders ED Discharge Orders     None         Glyn Ade, MD 10/26/22 2258

## 2022-10-26 NOTE — Consult Note (Signed)
NEURO HOSPITALIST CONSULT NOTE   Requestig physician: Dr. Doran Durand  Reason for Consult: Staring spell with aphasia  History obtained from:  Daughter and Chart     HPI:                                                                                                                                          Lindsey Huber is an 77 y.o. female with an extended PMHx including atrial fibrillation (on Xarelto), AICD, HLD, lupus and mild dementia who comes to the ED for evaluation of a recurrent spell of staring with verbal unresponsiveness. She has had about 6 such spells since 2021, all essentially stereotyped, with some minor variations. Spells generally last for about 20 minutes. They have not included jerking or twitching, except for some RUE twitching today. Her last spell was about 3 weeks ago, for which she was brought to the [facility] ED for evaluation. Her last spell included right facial drooping and LLE weakness, which has not occurred with prior spells. The spell today involves the usual staring and verbal unresponsiveness, but also with some RUE shaking. Her daughter, who is present in CT, states that the symptoms are now almost completely resolved, with the patient's speech nearly back to baseline.   Past Medical History:  Diagnosis Date   Achalasia    Acquired hypothyroidism 08/20/2020   Acute metabolic encephalopathy 07/25/2021   AICD (automatic cardioverter/defibrillator) present 11/14/2019   Allergic rhinitis    Ankle fracture 03/25/2022   Aortic valve disorder 03/26/2002   Atherosclerosis of abdominal aorta (HCC) 05/01/2020   Cardiomyopathy (HCC)    CHB (complete heart block) (HCC) 08/18/2017   CHF (congestive heart failure) (HCC)    Cholelithiasis without obstruction 05/01/2020   Chronic gouty arthritis    Chronic heart failure with preserved ejection fraction (HCC) 05/01/2020   Chronic kidney disease (CKD), active medical management without dialysis, stage  3 (moderate) (HCC) 05/01/2020   Closed torus fracture of distal end of right radius with delayed healing 08/12/2021   Delusional thoughts (HCC)    Diverticulosis of colon 05/01/2020   Epistaxis    Essential (primary) hypertension 08/18/2017   Gastroesophageal reflux disease 05/01/2020   Gastrointestinal hemorrhage    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 11/14/2019   Hypercoagulability due to atrial fibrillation (HCC) 05/01/2020   Hyperlipidemia 11/14/2019   Hypocalcemia 12/14/2020   Hypoglycemia    Hypokalemia 12/14/2020   Hypomagnesemia 12/14/2020   Hypotension 12/14/2020   Idiopathic pulmonary fibrosis (HCC) 05/01/2020   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   Iron deficiency anemia    Long term (current) use of anticoagulants    Malnutrition of mild degree [name]: 75% to less than 90% of standard weight) (HCC)    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/12/2021   Mitral and  aortic incompetence 11/14/2019   Non-rheumatic atrial fibrillation (HCC) 05/01/2020   Non-toxic multinodular goiter 08/20/2020   NSVT (nonsustained ventricular tachycardia) (HCC) 08/18/2017   Oropharyngeal dysphagia    Osteoarthritis of hip 05/01/2020   Osteoarthritis of knee 05/01/2020   Osteopenia of neck of left femur    Pain of left hip joint 12/12/2017   Proteinuria 05/01/2020   Raynaud's disease    Recurrent falls 04/09/2021   Rheumatoid arthritis (HCC)    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   Septic shock (HCC) 03/10/2020   Slow transit constipation    Systemic lupus erythematosus (HCC) 05/01/2020   Thrombophilia (HCC)    Transient ischemic attack    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   Vitamin D deficiency     Past Surgical History:  Procedure Laterality Date   BREAST BIOPSY Left    CARDIAC VALVE SURGERY     CARPAL TUNNEL RELEASE     COLONOSCOPY WITH PROPOFOL N/A 12/20/2020   Procedure: COLONOSCOPY WITH PROPOFOL;   Surgeon: Willis Modena, MD;  Location: [facility] ENDOSCOPY;  Service: Endoscopy;  Laterality: N/A;   HEMORRHOID SURGERY     PACEMAKER INSERTION     RIGHT/LEFT HEART CATH AND CORONARY ANGIOGRAPHY N/A 11/27/2020   Procedure: RIGHT/LEFT HEART CATH AND CORONARY ANGIOGRAPHY;  Surgeon: Dolores Patty, MD;  Location: [facility] INVASIVE CV LAB;  Service: Cardiovascular;  Laterality: N/A;   TONSILLECTOMY      Family History  Problem Relation Age of Onset   Heart disease Mother    Hypertension Mother    Rheum arthritis Mother    Osteoarthritis Mother    Heart disease Father    Hypertension Father    Osteoarthritis Father    Heart disease Sister    Diabetes Brother    Cancer Brother              Social History:  reports that she has never smoked. She has been exposed to tobacco smoke. She has never used smokeless tobacco. She reports that she does not drink alcohol and does not use drugs.  Allergies  Allergen Reactions   Other Other (See Comments)    Patient is to NOT EAT any foods with husks or tree nuts, popcorn   Propofol Shortness Of Breath and Other (See Comments)    "Caused asthma"   Flecainide Other (See Comments)    Reaction not recalled   Amiodarone Other (See Comments)    Delirium/Confusion/Psychosis   Amlodipine Other (See Comments)    "Tired and syncope"    HOME MEDICATIONS:                                                                                                                      No current facility-administered medications on file prior to encounter.   Current Outpatient Medications on File Prior to Encounter  Medication Sig Dispense Refill   acetaminophen (TYLENOL) 500 MG tablet Take 1,000 mg by mouth every 6 (six) hours as needed (pain).  albuterol (VENTOLIN HFA) 108 (90 Base) MCG/ACT inhaler Inhale 2 puffs into the lungs every 6 (six) hours as needed for wheezing or shortness of breath.     allopurinol (ZYLOPRIM) 100 MG tablet TAKE 1 TABLET BY MOUTH EVERY DAY  90 tablet 1   amoxicillin (AMOXIL) 500 MG capsule 4 capsules by mouth 1 hour before dental procedure.     Budeson-Glycopyrrol-Formoterol (BREZTRI AEROSPHERE) 160-9-4.8 MCG/ACT AERO Inhale 2 puffs into the lungs 2 (two) times daily as needed ("for flares").     calcium-vitamin D (OSCAL WITH D) 500-5 MG-MCG tablet Take 2 tablets by mouth 2 (two) times daily. 120 tablet 0   cetirizine HCl (ZYRTEC) 5 MG/5ML SOLN 1 mL as needed Orally Twice a day for 30 day(s)     Cholecalciferol (VITAMIN D3) 25 MCG (1000 UT) CAPS Take 1,000 Units by mouth daily with lunch.     fluticasone (FLONASE) 50 MCG/ACT nasal spray PLACE 1 SPRAY INTO BOTH NOSTRILS 2 (TWO) TIMES DAILY 48 mL 2   furosemide (LASIX) 40 MG tablet Take 1 tablet (40 mg total) by mouth daily. 180 tablet 3   Glycerin-Hypromellose-PEG 400 (DRY EYE RELIEF DROPS OP) Place 1 drop into both eyes in the morning.     hydroxychloroquine (PLAQUENIL) 200 MG tablet TAKE 1 TABLET BY MOUTH EVERY DAY 90 tablet 0   levothyroxine (SYNTHROID) 25 MCG tablet Take 1 tablet (25 mcg total) by mouth daily. 90 tablet 3   magnesium oxide (MAG-OX) 400 (240 Mg) MG tablet TAKE 1 TABLET BY MOUTH EVERY DAY 90 tablet 3   metoprolol tartrate (LOPRESSOR) 25 MG tablet Take 0.5 tablets (12.5 mg total) by mouth 2 (two) times daily. 30 tablet 0   Nintedanib (OFEV) 150 MG CAPS Take 150 mg by mouth at bedtime.     OLANZapine (ZYPREXA) 2.5 MG tablet TAKE 1 TABLET BY MOUTH EVERYDAY AT BEDTIME 90 tablet 0   pantoprazole (PROTONIX) 40 MG tablet Take 1 tablet (40 mg total) by mouth 2 (two) times daily. 60 tablet 0   polyethylene glycol powder (MIRALAX) 17 GM/SCOOP powder as directed Orally Once a day for 30 day(s)     potassium chloride (KLOR-CON M10) 10 MEQ tablet TAKE 2 TABLETS BY MOUTH DAILY 180 tablet 2   predniSONE (DELTASONE) 5 MG tablet TAKE 1 TABLET (5 MG TOTAL) BY MOUTH DAILY WITH BREAKFAST. PATIENT TO DOUBLE UP DOSE DURING SICK DAY RULE 100 tablet 3   XARELTO 15 MG TABS tablet TAKE 1  TABLET (15 MG TOTAL) BY MOUTH DAILY WITH SUPPER 90 tablet 1     ROS:                                                                                                                                       Deferred in the context of acuity of presentation.    Height 5\' 3"  (1.6 m), weight 54 kg. BP (!) 160/82   Pulse  70   Temp 98 F (36.7 C) (Oral)   Resp 17   Ht 5\' 3"  (1.6 m)   Wt 54 kg   SpO2 100%   BMI 21.08 kg/m     General Examination:                                                                                                       Physical Exam  HEENT:  [place]/AT Lungs: Respirations unlabored Extremities: Warm and well perfused. Distal LLE with edema.   Neurological Examination Mental Status: Awake and alert. Oriented x 5. Speech is slow and sparse, but fluent with intact naming and comprehension. One semantic paraphasia and one phonemic paraphasia noted during the course of the exam. No dysarthria. No neglect.  Cranial Nerves: II: Temporal visual fields intact bilaterally with no extinction to DSS. PERRL.  III,IV, VI: No ptosis. EOMI. No nystagmus.  V: Temp sensation equal bilaterally VII: Smile is symmetric VIII: Hearing intact to conversation IX,X: No hoarseness or hypophonia XI: Symmetric XII: Midline tongue extension Motor: Right : Upper extremity   5/5    Left:     Upper extremity   5/5  Lower extremity   5/5     Lower extremity   5/5 No pronator drift Sensory: Temp and light touch intact to BUE. FT intact and symmetric to BLE. Decreased temp sensation to distal legs.  Deep Tendon Reflexes: 1+ and symmetric bilateral patellae.  Cerebellar: No ataxia with FNF bilaterally Gait: Deferred    Lab Results: Basic Metabolic Panel: Recent Labs  Lab 10/26/22 1542  NA 140  K 3.6  CL 100  GLUCOSE 99  BUN 26*  CREATININE 1.40*    CBC: Recent Labs  Lab 10/26/22 1533 10/26/22 1542  WBC 8.6  --   NEUTROABS 6.6  --   HGB 11.0* 12.2  HCT 34.2* 36.0  MCV  98.6  --   PLT 197  --     Cardiac Enzymes: No results for input(s): "CKTOTAL", "CKMB", "CKMBINDEX", "TROPONINI" in the last 168 hours.  Lipid Panel: No results for input(s): "CHOL", "TRIG", "HDL", "CHOLHDL", "VLDL", "LDLCALC" in the last 168 hours.  Imaging: No results found.   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  - Symptoms are rapidly resolving.  - No known history of seizures per daughter.    Recommendations: - STAT EEG (ordered) - May not be able to obtain MRI due to AICD - Ordering an ammonia level given mildly elevated LFTs   Addendum: - EEG reveals left temporal sharp waves - Will load with Keppra 2000 mg and continue at 500 mg po BID.  - Will repeat spot EEG tomorrow (ordered for noon on Wednesday)   Electronically signed: Dr. Caryl Pina 10/26/2022, 4:00 PM

## 2022-10-26 NOTE — Progress Notes (Signed)
Patient here for appointment with Dr. [name], at beginning of visit patient is alert and oriented x4, able to follow commands, and answer questions appropriately. During check in process patient became suddenly disoriented, unable to follow up simple commands, no longer answering questions appropriately. Per patients daughter this happened 2 weeks ago and patient was diagnosed with TIA.   Patients vital signs stable, BP 150/92, HR 70 and o2 92%.  Dr Elwyn Lade evaluated patient and determined patient should be see in the ER. Rapid response called and patient transported to ER by RR Nurse and with patients daughter.   Patients last known well/baseline was 3:16pm.

## 2022-10-26 NOTE — H&P (Addendum)
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 examinations, most                 recent 03/18/2022. Defibrillator, PAD; Aortic Valve Disease,                 Mitral Valve Disease and Tricuspid valve anunuloplasty ring.                 Previous echo revealed LVEF 65% AVA mean gradient 35 mmHg,                 prost. tricuspid valve gradient 10 mmHg and mitral valvuloplasty                 ring gradient 5 mmHg. 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 mild to                 moderate TR.

## 2022-10-26 NOTE — Code Documentation (Signed)
Lindsey Huber is a 77 yr old female being seen today (10/26/2022) at [facility] Failure clinic. At 15:15 today, she had a witnessed "staring into space" spell followed by difficulty speaking. Code stroke alert was activated by RRN for aphasia. Pt has PMH of HF, CKD, and AF. She is on Xarelto.     Pt met by stroke team in CT scanner. NIHSS initially 6, then improved to 1, for mild aphasia. Please see documentation for code stroke times and NIHSS details. The following imaging was completed: CT. Per Dr. Otelia Limes, CT negative for acute abnormality.     Pt back to ED room 21 where her workup will continue. She will need q 2 hr VS and NIHSS. Pt ineligible for thrombolytic due to Xarelto. She is not eligible for thrombectomy as LVO not suspected, exam rapidly improving. Bedside handoff with ED RN complete.

## 2022-10-27 ENCOUNTER — Encounter (HOSPITAL_COMMUNITY): Payer: Self-pay | Admitting: Internal Medicine

## 2022-10-27 DIAGNOSIS — R569 Unspecified convulsions: Secondary | ICD-10-CM | POA: Diagnosis not present

## 2022-10-27 LAB — TSH: TSH: 0.295 u[IU]/mL — ABNORMAL LOW (ref 0.350–4.500)

## 2022-10-27 LAB — BASIC METABOLIC PANEL
Anion gap: 12 (ref 5–15)
BUN: 20 mg/dL (ref 8–23)
CO2: 26 mmol/L (ref 22–32)
Calcium: 9 mg/dL (ref 8.9–10.3)
Chloride: 100 mmol/L (ref 98–111)
Creatinine, Ser: 1.07 mg/dL — ABNORMAL HIGH (ref 0.44–1.00)
GFR, Estimated: 54 mL/min — ABNORMAL LOW (ref >60)
Glucose, Bld: 71 mg/dL (ref 70–99)
Potassium: 3.6 mmol/L (ref 3.5–5.1)
Sodium: 138 mmol/L (ref 135–145)

## 2022-10-27 LAB — MAGNESIUM: Magnesium: 2.1 mg/dL (ref 1.7–2.4)

## 2022-10-27 LAB — PHOSPHORUS: Phosphorus: 4.3 mg/dL (ref 2.5–4.6)

## 2022-10-27 MED ORDER — LEVETIRACETAM 500 MG PO TABS: 500.0000 mg | ORAL_TABLET | Freq: Two times a day (BID) | ORAL | 2 refills | Status: DC

## 2022-10-27 NOTE — Care Management CC44 (Signed)
Condition Code 44 Documentation Completed  Patient Details  Name: Lindsey Huber MRN: [id number] Date of Birth: 1945-06-27   Condition Code 44 given:  Yes Patient signature on Condition Code 44 notice:  Yes Documentation of 2 MD's agreement:  Yes Code 44 added to claim:  Yes    Oletta Cohn, RN 10/27/2022, 2:25 PM

## 2022-10-27 NOTE — Care Management Obs Status (Signed)
MEDICARE OBSERVATION STATUS NOTIFICATION   Patient Details  Name: Lindsey Huber MRN: [id number] Date of Birth: [birth year]   Medicare Observation Status Notification Given:  Yes    Oletta Cohn, RN 10/27/2022, 2:25 PM

## 2022-10-27 NOTE — Hospital Course (Addendum)
   Assessment and plan.

## 2022-10-27 NOTE — ED Triage Notes (Signed)
Pt coming in following appointment at heart failure clinic. Pt family states pt was speaking normally and then developed sudden aphasia and blank stare. Pt denies CP, SOB. Patient alert and oriented x4.

## 2022-10-27 NOTE — Progress Notes (Signed)
Subjective: No further seizures overnight.  Per daughter, patient was talking gibberish for example when patient's daughter asked what year is it t patient said something about phone.  Patient denies any concerns right now.  ROS: negative except above  Examination  Vital signs in last 24 hours: Temp:  [97.5 F (36.4 C)-98 F (36.7 C)] 97.5 F (36.4 C) (10/02 1012) Pulse Rate:  [67-73] 73 (10/02 1012) Resp:  [14-22] 20 (10/02 1012) BP: (97-162)/(62-92) 126/71 (10/02 1012) SpO2:  [92 %-100 %] 100 % (10/02 1012) Weight:  [54 kg-54.2 kg] 54 kg (10/01 1559)  General: lying in bed, NAD Neuro: MS: Alert, oriented, follows commands, no aphasia, attention span intact CN: pupils equal and reactive,  EOMI, face symmetric, tongue midline, normal sensation over face, Motor: 5/5 strength in all 4 extremities Coordination: normal Gait: not tested  Basic Metabolic Panel: Recent Labs  Lab 10/26/22 1533 10/26/22 1542 10/27/22 0300  NA 136 140 138  K 3.5 3.6 3.6  CL 98 100 100  CO2 25  --  26  GLUCOSE 103* 99 71  BUN 25* 26* 20  CREATININE 1.37* 1.40* 1.07*  CALCIUM 9.4  --  9.0  MG  --   --  2.1  PHOS  --   --  4.3    CBC: Recent Labs  Lab 10/26/22 1533 10/26/22 1542  WBC 8.6  --   NEUTROABS 6.6  --   HGB 11.0* 12.2  HCT 34.2* 36.0  MCV 98.6  --   PLT 197  --      Coagulation Studies: Recent Labs    10/26/22 1533  LABPROT 22.4*  INR 1.9*    Imaging CT head without contrast 10/26/2022: No acute intracranial abnormality. Advanced chronic small vessel ischemic disease.  ASSESSMENT AND PLAN: 77 year old female with past medical history of atrial fibrillation on Xarelto, AICD, lupus, mild cognitive impairment who presented to our evaluation of recurrent spells of staring with speech disturbance.  Also reports transient jerking in right upper extremity.  Patient has had about 6 such episodes since 2021.  Of note patient is on olanzapine for hallucinations.  No other  seizure provoking factors.  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  Patient symptoms along with EEG findings is concerning for focal seizures -Discussed EEG findings with patient as well as daughter on phone.  Recommend starting Keppra 500 mg twice daily. -I discussed potential side effects of Keppra including irritability especially with her history of hallucinations.  Plan is for now to start Keppra and if patient has any worsening of symptoms or any other side effects, will discuss with Dr. Everlena Cooper and consider different antiseizure medication like oxcarbazepine or lamotrigine -Controlled the patient has an AICD which is not MRI compatible.  Therefore cannot obtain MRI brain -Discussed seizure precautions.  Patient does not drive per daughter -Continue follow-up with Dr. Everlena Cooper in 3 months -Discussed plan with Dr. Tyson Babinski via secure chat  Seizure precautions: Per [facility] statutes, patients with seizures are not allowed to drive until they have been seizure-free for six months and cleared by a physician    Use caution when using heavy equipment or power tools. Avoid working on ladders or at heights. Take showers instead of baths. Ensure the water temperature is not too high on the home water heater. Do not go swimming alone. Do not lock yourself in a room alone (i.e. bathroom). When caring for infants or small children, sit down when holding, feeding, or  changing them to minimize risk of injury to the child in the event you have a seizure. Maintain good sleep hygiene. Avoid alcohol.    If patient has another seizure, call 911 and bring them back to the ED if: A.  The seizure lasts longer than 5 minutes.      B.  The patient doesn't wake shortly after the seizure or has new problems such as difficulty seeing, speaking or moving following the seizure C.  The patient was injured during the seizure D.  The patient has a temperature over 102 F  (39C) E.  The patient vomited during the seizure and now is having trouble breathing    During the Seizure   - First, ensure adequate ventilation and place patients on the floor on their left side  Loosen clothing around the neck and ensure the airway is patent. If the patient is clenching the teeth, do not force the mouth open with any object as this can cause severe damage - Remove all items from the surrounding that can be hazardous. The patient may be oblivious to what's happening and may not even know what he or she is doing. If the patient is confused and wandering, either gently guide him/her away and block access to outside areas - Reassure the individual and be comforting - Call 911. In most cases, the seizure ends before EMS arrives. However, there are cases when seizures may last over 3 to 5 minutes. Or the individual may have developed breathing difficulties or severe injuries. If a pregnant patient or a person with diabetes develops a seizure, it is prudent to call an ambulance.    After the Seizure (Postictal Stage)   After a seizure, most patients experience confusion, fatigue, muscle pain and/or a headache. Thus, one should permit the individual to sleep. For the next few days, reassurance is essential. Being calm and helping reorient the person is also of importance.   Most seizures are painless and end spontaneously. Seizures are not harmful to others but can lead to complications such as stress on the lungs, brain and the heart. Individuals with prior lung problems may develop labored breathing and respiratory distress.   I have spent a total of  36  minutes with the patient reviewing hospital notes,  test results, labs and examining the patient as well as establishing an assessment and plan that was discussed personally with the patient.  > 50% of time was spent in direct patient care.         Lindie Spruce Epilepsy [facility] For questions after 5pm  please refer to AMION to reach the Neurologist on call

## 2022-10-27 NOTE — Discharge Summary (Signed)
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 REPORT   Patient Name:   Enyah Moman Date of Exam: 10/26/2022 Medical Rec #:  [id number]        Height:       61.0 in Accession #:    [id number]       Weight:       121.0 lb Date of Birth:  21-Nov-1945        BSA:          1.526 m Patient Age:    77 years         BP:

## 2022-10-28 ENCOUNTER — Inpatient Hospital Stay (HOSPITAL_COMMUNITY): Admission: RE | Admit: 2022-10-28 | Payer: Medicare HMO | Source: Ambulatory Visit

## 2022-10-28 ENCOUNTER — Telehealth: Payer: Self-pay

## 2022-10-28 ENCOUNTER — Other Ambulatory Visit (HOSPITAL_COMMUNITY): Payer: Medicare HMO

## 2022-10-28 NOTE — Telephone Encounter (Signed)
Transition Care Management Follow-up Telephone Call     Date discharged? 10/27/2022   How have you been since you were released from the hospital? She is doing well resting started on a new medication keppra    Any patient concerns? worried about possible mood swings from the Keppra      Items Reviewed: Medications reviewed:  acetaminophen 500 MG tablet Commonly known as: TYLENOL Take 1,000 mg by mouth every 6 (six) hours as needed (pain).    albuterol 108 (90 Base) MCG/ACT inhaler Commonly known as: VENTOLIN HFA Inhale 2 puffs into the lungs every 6 (six) hours as needed for wheezing or shortness of breath.    allopurinol 100 MG tablet Commonly known as: ZYLOPRIM TAKE 1 TABLET BY MOUTH EVERY DAY    amoxicillin 500 MG capsule Commonly known as: AMOXIL 4 capsules by mouth 1 hour before dental procedure.    Breztri Aerosphere 160-9-4.8 MCG/ACT Aero Generic drug: Budeson-Glycopyrrol-Formoterol Inhale 2 puffs into the lungs 2 (two) times daily as needed ("for flares").    calcium-vitamin D 500-5 MG-MCG tablet Commonly known as: OSCAL WITH D Take 2 tablets by mouth 2 (two) times daily.    cetirizine HCl 5 MG/5ML Soln Commonly known as: Zyrtec 1 mL as needed Orally Twice a day for 30 day(s)    DRY EYE RELIEF DROPS OP Place 1 drop into both eyes daily as needed (for dry eye).    fluticasone 50 MCG/ACT nasal spray Commonly known as: FLONASE PLACE 1 SPRAY INTO BOTH NOSTRILS 2 (TWO) TIMES DAILY What changed: See the new instructions.    furosemide 40 MG tablet Commonly known as: LASIX Take 1 tablet (40 mg total) by mouth daily.    hydroxychloroquine 200 MG tablet Commonly known as: PLAQUENIL TAKE 1 TABLET BY MOUTH EVERY DAY    Klor-Con M10 10 MEQ tablet Generic drug: potassium chloride TAKE 2 TABLETS BY MOUTH DAILY    levETIRAcetam 500 MG tablet Commonly known as: KEPPRA Take 1 tablet (500 mg total) by mouth 2 (two) times daily.    levothyroxine 25 MCG  tablet Commonly known as: SYNTHROID Take 1 tablet (25 mcg total) by mouth daily.    magnesium oxide 400 (240 Mg) MG tablet Commonly known as: MAG-OX TAKE 1 TABLET BY MOUTH EVERY DAY    metoprolol tartrate 25 MG tablet Commonly known as: LOPRESSOR Take 0.5 tablets (12.5 mg total) by mouth 2 (two) times daily.    MiraLax 17 GM/SCOOP powder Generic drug: polyethylene glycol powder Take 17 g by mouth daily as needed for mild constipation.    Ofev 150 MG Caps Generic drug: Nintedanib Take 150 mg by mouth at bedtime.    OLANZapine 2.5 MG tablet Commonly known as: ZYPREXA TAKE 1 TABLET BY MOUTH EVERYDAY AT BEDTIME    pantoprazole 40 MG tablet Commonly known as: PROTONIX Take 1 tablet (40 mg total) by mouth 2 (two) times daily.    predniSONE 5 MG tablet Commonly known as: DELTASONE TAKE 1 TABLET (5 MG TOTAL) BY MOUTH DAILY WITH BREAKFAST. PATIENT TO DOUBLE UP DOSE DURING SICK DAY RULE    Vitamin D3 25 MCG (1000 UT) Caps Take 1,000 Units by mouth daily with lunch.    Xarelto 15 MG Tabs tablet Generic drug: Rivaroxaban TAKE 1 TABLET (15 MG TOTAL) BY MOUTH DAILY WITH SUPPER   Allergies reviewed:   Other Other (See Comments)     Patient is to NOT EAT any foods with husks or tree nuts, popcorn    Propofol Shortness  Of Breath, Other (See Comments)    "Caused asthma"    Flecainide Other (See Comments)    Reaction not recalled    Amiodarone Other (See Comments)    Delirium/Confusion/Psychosis    Amlodipine Other (See Comments)    "Tired and syncope"    Dietary changes reviewed: low sodium heart healthy   Referrals reviewed: no new referrals      Functional Questionnaire:  Independent - I Dependent - D    Activities of Daily Living (ADLs):     Personal hygiene - I Dressing - I Eating - I Maintaining continence - I Transferring - I has a cane and a walker    Independent Activities of Daily Living (iADLs): Basic communication skills - I Transportation - D Meal  preparation  - D Shopping - I - she will ride in a scooter  Housework - D  but she will keep her room clean Managing medications - D she has a pill box  Managing personal finances - D   Confirmed importance and date/time of follow-up visits scheduled 11/04/22 Provider Appointment booked with [facility]   Confirmed with patient if condition begins to worsen call PCP or go to the ER.  Patient was given the office number and encouraged to call back with question or concerns: Yes

## 2022-10-31 ENCOUNTER — Other Ambulatory Visit: Payer: Self-pay | Admitting: Neurology

## 2022-11-02 ENCOUNTER — Inpatient Hospital Stay (HOSPITAL_COMMUNITY)
Admission: EM | Admit: 2022-11-02 | Discharge: 2022-11-11 | DRG: 377 | Disposition: A | Discharge status: Home Health | Payer: Medicare HMO | Attending: Internal Medicine | Admitting: Internal Medicine

## 2022-11-02 ENCOUNTER — Other Ambulatory Visit: Payer: Self-pay

## 2022-11-02 ENCOUNTER — Emergency Department (HOSPITAL_COMMUNITY): Payer: Medicare HMO

## 2022-11-02 ENCOUNTER — Encounter (HOSPITAL_COMMUNITY): Payer: Self-pay | Admitting: Emergency Medicine

## 2022-11-02 DIAGNOSIS — Z9889 Other specified postprocedural states: Secondary | ICD-10-CM

## 2022-11-02 DIAGNOSIS — Z888 Allergy status to other drugs, medicaments and biological substances status: Secondary | ICD-10-CM

## 2022-11-02 DIAGNOSIS — E785 Hyperlipidemia, unspecified: Secondary | ICD-10-CM | POA: Diagnosis present

## 2022-11-02 DIAGNOSIS — M329 Systemic lupus erythematosus, unspecified: Secondary | ICD-10-CM | POA: Diagnosis present

## 2022-11-02 DIAGNOSIS — M069 Rheumatoid arthritis, unspecified: Secondary | ICD-10-CM | POA: Diagnosis present

## 2022-11-02 DIAGNOSIS — Z8673 Personal history of transient ischemic attack (TIA), and cerebral infarction without residual deficits: Secondary | ICD-10-CM

## 2022-11-02 DIAGNOSIS — Z5181 Encounter for therapeutic drug level monitoring: Secondary | ICD-10-CM

## 2022-11-02 DIAGNOSIS — I700 Atherosclerosis of aorta: Secondary | ICD-10-CM | POA: Diagnosis present

## 2022-11-02 DIAGNOSIS — Z95810 Presence of automatic (implantable) cardiac defibrillator: Secondary | ICD-10-CM | POA: Diagnosis not present

## 2022-11-02 DIAGNOSIS — Z23 Encounter for immunization: Secondary | ICD-10-CM

## 2022-11-02 DIAGNOSIS — I5032 Chronic diastolic (congestive) heart failure: Secondary | ICD-10-CM | POA: Diagnosis present

## 2022-11-02 DIAGNOSIS — Z8261 Family history of arthritis: Secondary | ICD-10-CM

## 2022-11-02 DIAGNOSIS — K922 Gastrointestinal hemorrhage, unspecified: Secondary | ICD-10-CM

## 2022-11-02 DIAGNOSIS — Z833 Family history of diabetes mellitus: Secondary | ICD-10-CM

## 2022-11-02 DIAGNOSIS — N179 Acute kidney failure, unspecified: Secondary | ICD-10-CM | POA: Diagnosis not present

## 2022-11-02 DIAGNOSIS — I10 Essential (primary) hypertension: Secondary | ICD-10-CM | POA: Diagnosis present

## 2022-11-02 DIAGNOSIS — I130 Hypertensive heart and chronic kidney disease with heart failure and stage 1 through stage 4 chronic kidney disease, or unspecified chronic kidney disease: Secondary | ICD-10-CM | POA: Diagnosis present

## 2022-11-02 DIAGNOSIS — Z952 Presence of prosthetic heart valve: Secondary | ICD-10-CM | POA: Diagnosis not present

## 2022-11-02 DIAGNOSIS — Z8249 Family history of ischemic heart disease and other diseases of the circulatory system: Secondary | ICD-10-CM

## 2022-11-02 DIAGNOSIS — K5791 Diverticulosis of intestine, part unspecified, without perforation or abscess with bleeding: Secondary | ICD-10-CM

## 2022-11-02 DIAGNOSIS — I495 Sick sinus syndrome: Secondary | ICD-10-CM | POA: Diagnosis present

## 2022-11-02 DIAGNOSIS — Z953 Presence of xenogenic heart valve: Secondary | ICD-10-CM | POA: Diagnosis not present

## 2022-11-02 DIAGNOSIS — Z7901 Long term (current) use of anticoagulants: Secondary | ICD-10-CM | POA: Diagnosis not present

## 2022-11-02 DIAGNOSIS — I4821 Permanent atrial fibrillation: Secondary | ICD-10-CM | POA: Diagnosis present

## 2022-11-02 DIAGNOSIS — F03A0 Unspecified dementia, mild, without behavioral disturbance, psychotic disturbance, mood disturbance, and anxiety: Secondary | ICD-10-CM | POA: Diagnosis present

## 2022-11-02 DIAGNOSIS — E039 Hypothyroidism, unspecified: Secondary | ICD-10-CM | POA: Diagnosis present

## 2022-11-02 DIAGNOSIS — I272 Pulmonary hypertension, unspecified: Secondary | ICD-10-CM | POA: Diagnosis present

## 2022-11-02 DIAGNOSIS — I442 Atrioventricular block, complete: Secondary | ICD-10-CM | POA: Diagnosis present

## 2022-11-02 DIAGNOSIS — R569 Unspecified convulsions: Secondary | ICD-10-CM

## 2022-11-02 DIAGNOSIS — R55 Syncope and collapse: Secondary | ICD-10-CM | POA: Diagnosis present

## 2022-11-02 DIAGNOSIS — K5731 Diverticulosis of large intestine without perforation or abscess with bleeding: Principal | ICD-10-CM | POA: Diagnosis present

## 2022-11-02 DIAGNOSIS — J4489 Other specified chronic obstructive pulmonary disease: Secondary | ICD-10-CM | POA: Diagnosis present

## 2022-11-02 DIAGNOSIS — D62 Acute posthemorrhagic anemia: Secondary | ICD-10-CM | POA: Diagnosis present

## 2022-11-02 DIAGNOSIS — E871 Hypo-osmolality and hyponatremia: Secondary | ICD-10-CM | POA: Diagnosis not present

## 2022-11-02 DIAGNOSIS — R578 Other shock: Secondary | ICD-10-CM | POA: Diagnosis present

## 2022-11-02 DIAGNOSIS — N1831 Chronic kidney disease, stage 3a: Secondary | ICD-10-CM | POA: Diagnosis present

## 2022-11-02 DIAGNOSIS — I482 Chronic atrial fibrillation, unspecified: Secondary | ICD-10-CM

## 2022-11-02 DIAGNOSIS — J84112 Idiopathic pulmonary fibrosis: Secondary | ICD-10-CM | POA: Diagnosis present

## 2022-11-02 DIAGNOSIS — R296 Repeated falls: Secondary | ICD-10-CM | POA: Diagnosis present

## 2022-11-02 DIAGNOSIS — G4733 Obstructive sleep apnea (adult) (pediatric): Secondary | ICD-10-CM | POA: Diagnosis present

## 2022-11-02 DIAGNOSIS — Z79899 Other long term (current) drug therapy: Secondary | ICD-10-CM

## 2022-11-02 DIAGNOSIS — K529 Noninfective gastroenteritis and colitis, unspecified: Secondary | ICD-10-CM | POA: Diagnosis present

## 2022-11-02 DIAGNOSIS — Z7951 Long term (current) use of inhaled steroids: Secondary | ICD-10-CM

## 2022-11-02 DIAGNOSIS — R609 Edema, unspecified: Secondary | ICD-10-CM | POA: Diagnosis not present

## 2022-11-02 DIAGNOSIS — I4892 Unspecified atrial flutter: Secondary | ICD-10-CM | POA: Diagnosis present

## 2022-11-02 DIAGNOSIS — M159 Polyosteoarthritis, unspecified: Secondary | ICD-10-CM | POA: Diagnosis present

## 2022-11-02 DIAGNOSIS — I7300 Raynaud's syndrome without gangrene: Secondary | ICD-10-CM | POA: Diagnosis present

## 2022-11-02 DIAGNOSIS — Z79890 Hormone replacement therapy (postmenopausal): Secondary | ICD-10-CM

## 2022-11-02 HISTORY — DX: Gastrointestinal hemorrhage, unspecified: K92.2

## 2022-11-02 LAB — COMPREHENSIVE METABOLIC PANEL
ALT: 20 U/L (ref 0–44)
AST: 26 U/L (ref 15–41)
Albumin: 2.7 g/dL — ABNORMAL LOW (ref 3.5–5.0)
Alkaline Phosphatase: 71 U/L (ref 38–126)
Anion gap: 8 (ref 5–15)
BUN: 26 mg/dL — ABNORMAL HIGH (ref 8–23)
CO2: 26 mmol/L (ref 22–32)
Calcium: 8.6 mg/dL — ABNORMAL LOW (ref 8.9–10.3)
Chloride: 103 mmol/L (ref 98–111)
Creatinine, Ser: 1.33 mg/dL — ABNORMAL HIGH (ref 0.44–1.00)
GFR, Estimated: 42 mL/min — ABNORMAL LOW (ref >60)
Glucose, Bld: 142 mg/dL — ABNORMAL HIGH (ref 70–99)
Potassium: 3.5 mmol/L (ref 3.5–5.1)
Sodium: 137 mmol/L (ref 135–145)
Total Bilirubin: 0.6 mg/dL (ref 0.3–1.2)
Total Protein: 6.5 g/dL (ref 6.5–8.1)

## 2022-11-02 LAB — CBC WITH DIFFERENTIAL/PLATELET
Abs Immature Granulocytes: 0.04 10*3/uL (ref 0.00–0.07)
Basophils Absolute: 0 10*3/uL (ref 0.0–0.1)
Basophils Relative: 0 %
Eosinophils Absolute: 0 10*3/uL (ref 0.0–0.5)
Eosinophils Relative: 0 %
HCT: 30.1 % — ABNORMAL LOW (ref 36.0–46.0)
Hemoglobin: 9.6 g/dL — ABNORMAL LOW (ref 12.0–15.0)
Immature Granulocytes: 0 %
Lymphocytes Relative: 25 %
Lymphs Abs: 2.7 10*3/uL (ref 0.7–4.0)
MCH: 32.7 pg (ref 26.0–34.0)
MCHC: 31.9 g/dL (ref 30.0–36.0)
MCV: 102.4 fL — ABNORMAL HIGH (ref 80.0–100.0)
Monocytes Absolute: 0.6 10*3/uL (ref 0.1–1.0)
Monocytes Relative: 5 %
Neutro Abs: 7.7 10*3/uL (ref 1.7–7.7)
Neutrophils Relative %: 70 %
Platelets: 186 10*3/uL (ref 150–400)
RBC: 2.94 MIL/uL — ABNORMAL LOW (ref 3.87–5.11)
RDW: 14.5 % (ref 11.5–15.5)
WBC: 11.1 10*3/uL — ABNORMAL HIGH (ref 4.0–10.5)
nRBC: 0 % (ref 0.0–0.2)

## 2022-11-02 LAB — CBG MONITORING, ED: Glucose-Capillary: 97 mg/dL (ref 70–99)

## 2022-11-02 LAB — I-STAT CG4 LACTIC ACID, ED: Lactic Acid, Venous: 2 mmol/L (ref 0.5–1.9)

## 2022-11-02 LAB — GLUCOSE, CAPILLARY: Glucose-Capillary: 96 mg/dL (ref 70–99)

## 2022-11-02 LAB — PREPARE RBC (CROSSMATCH)

## 2022-11-02 LAB — MRSA NEXT GEN BY PCR, NASAL: MRSA by PCR Next Gen: NOT DETECTED

## 2022-11-02 MED ORDER — VITAMIN K1 10 MG/ML IJ SOLN: 10.0000 mg | INTRAVENOUS | Status: DC

## 2022-11-02 MED ORDER — PROTHROMBIN COMPLEX CONC HUMAN 500 UNITS IV KIT
1566.0000 [IU] | PACK | Status: AC
  Administered 2022-11-02: 1566 [IU] via INTRAVENOUS
  Filled 2022-11-02: qty 1566

## 2022-11-02 MED ORDER — POLYETHYLENE GLYCOL 3350 17 G PO PACK: 17.0000 g | PACK | Freq: Every day | ORAL | Status: DC | PRN

## 2022-11-02 MED ORDER — FLUTICASONE FUROATE-VILANTEROL 200-25 MCG/ACT IN AEPB
1.0000 | INHALATION_SPRAY | Freq: Every day | RESPIRATORY_TRACT | Status: DC
  Administered 2022-11-03 – 2022-11-11 (×9): 1 via RESPIRATORY_TRACT
  Filled 2022-11-02: qty 28

## 2022-11-02 MED ORDER — IOHEXOL 350 MG/ML SOLN: 60.0000 mL | Freq: Once | INTRAVENOUS | Status: DC | PRN

## 2022-11-02 MED ORDER — DOCUSATE SODIUM 100 MG PO CAPS: 100.0000 mg | ORAL_CAPSULE | Freq: Two times a day (BID) | ORAL | Status: DC | PRN

## 2022-11-02 MED ORDER — PANTOPRAZOLE 80MG IVPB - SIMPLE MED
80.0000 mg | Freq: Once | INTRAVENOUS | Status: AC
  Administered 2022-11-02: 80 mg via INTRAVENOUS
  Filled 2022-11-02: qty 100

## 2022-11-02 MED ORDER — IOHEXOL 350 MG/ML SOLN
75.0000 mL | Freq: Once | INTRAVENOUS | Status: AC | PRN
  Administered 2022-11-02: 75 mL via INTRAVENOUS

## 2022-11-02 MED ORDER — ONDANSETRON HCL 4 MG/2ML IJ SOLN
4.0000 mg | Freq: Four times a day (QID) | INTRAMUSCULAR | Status: DC | PRN
  Administered 2022-11-02: 4 mg via INTRAVENOUS
  Filled 2022-11-02: qty 2

## 2022-11-02 MED ORDER — CHLORHEXIDINE GLUCONATE CLOTH 2 % EX PADS
6.0000 | MEDICATED_PAD | Freq: Every day | CUTANEOUS | Status: DC
  Administered 2022-11-03 – 2022-11-07 (×5): 6 via TOPICAL

## 2022-11-02 MED ORDER — PANTOPRAZOLE SODIUM 40 MG IV SOLR
40.0000 mg | Freq: Two times a day (BID) | INTRAVENOUS | Status: DC
  Administered 2022-11-03 – 2022-11-11 (×17): 40 mg via INTRAVENOUS
  Filled 2022-11-02 (×17): qty 10

## 2022-11-02 MED ORDER — UMECLIDINIUM BROMIDE 62.5 MCG/ACT IN AEPB
1.0000 | INHALATION_SPRAY | Freq: Every day | RESPIRATORY_TRACT | Status: DC
  Administered 2022-11-03 – 2022-11-11 (×8): 1 via RESPIRATORY_TRACT
  Filled 2022-11-02 (×2): qty 7

## 2022-11-02 MED ORDER — ORAL CARE MOUTH RINSE: 15.0000 mL | OROMUCOSAL | Status: DC | PRN

## 2022-11-02 MED ORDER — LEVETIRACETAM IN NACL 500 MG/100ML IV SOLN
500.0000 mg | Freq: Two times a day (BID) | INTRAVENOUS | Status: DC
  Administered 2022-11-02: 500 mg via INTRAVENOUS
  Filled 2022-11-02: qty 100

## 2022-11-02 MED ORDER — IPRATROPIUM-ALBUTEROL 0.5-2.5 (3) MG/3ML IN SOLN: 3.0000 mL | RESPIRATORY_TRACT | Status: DC | PRN

## 2022-11-02 MED ORDER — BUDESON-GLYCOPYRROL-FORMOTEROL 160-9-4.8 MCG/ACT IN AERO: 2.0000 | INHALATION_SPRAY | Freq: Two times a day (BID) | RESPIRATORY_TRACT | Status: DC | PRN

## 2022-11-02 MED ORDER — SODIUM CHLORIDE 0.9% IV SOLUTION: Freq: Once | INTRAVENOUS | Status: DC

## 2022-11-02 MED ORDER — PANTOPRAZOLE 80MG IVPB - SIMPLE MED
80.0000 mg | INTRAVENOUS | Status: DC
Start: 2022-11-02 — End: 2022-11-02

## 2022-11-02 MED ORDER — INFLUENZA VAC A&B SURF ANT ADJ 0.5 ML IM SUSY
0.5000 mL | PREFILLED_SYRINGE | INTRAMUSCULAR | Status: AC
  Administered 2022-11-03: 0.5 mL via INTRAMUSCULAR
  Filled 2022-11-02: qty 0.5

## 2022-11-02 NOTE — ED Triage Notes (Signed)
Pt BIB [facility] with reports of bright red rectal bleeding that started today at 1400. Pt is taking blood thinner.

## 2022-11-02 NOTE — ED Notes (Signed)
ED TO INPATIENT HANDOFF REPORT  ED Nurse Name and Phone #: Minerva Areola [phone]  S Name/Age/Gender Lindsey Huber 77 y.o. female Room/Bed: 028C/028C  Code Status   Code Status: Full Code  Home/SNF/Other Home Patient oriented to: self, place, time, and situation Is this baseline? Yes   Triage Complete: Triage complete  Chief Complaint GI bleed [K92.2]  Triage Note Pt BIB [facility] with reports of bright red rectal bleeding that started today at 1400. Pt is taking blood thinner.    Allergies Allergies  Allergen Reactions   Other Other (See Comments)    Patient is to NOT EAT any foods with husks or tree nuts, popcorn   Propofol Shortness Of Breath and Other (See Comments)    "Caused asthma"   Flecainide Other (See Comments)    Reaction not recalled   Amiodarone Other (See Comments)    Delirium/Confusion/Psychosis   Amlodipine Other (See Comments)    "Tired and syncope"    Level of Care/Admitting Diagnosis ED Disposition     ED Disposition  Admit   Condition  --   Comment  Hospital Area: [facility] [[id number]]  Level of Care: ICU [6]  May admit patient to [facility] or [facility] if equivalent level of care is available:: Yes  Covid Evaluation: Asymptomatic - no recent exposure (last 10 days) testing not required  Diagnosis: GI bleed [621308]  Admitting Physician: Oretha Milch [[id number]]  Attending Physician: Oretha Milch [[id number]]  Certification:: I certify this patient will need inpatient services for at least 2 midnights  Expected Medical Readiness: 11/05/2022          B Medical/Surgery History Past Medical History:  Diagnosis Date   Achalasia    Acquired hypothyroidism 08/20/2020   Acute metabolic encephalopathy 07/25/2021   AICD (automatic cardioverter/defibrillator) present 11/14/2019   Allergic rhinitis    Ankle fracture 03/25/2022   Aortic valve disorder 03/26/2002   Atherosclerosis of abdominal aorta (HCC) 05/01/2020   Cardiomyopathy  (HCC)    CHB (complete heart block) (HCC) 08/18/2017   CHF (congestive heart failure) (HCC)    Cholelithiasis without obstruction 05/01/2020   Chronic gouty arthritis    Chronic heart failure with preserved ejection fraction (HCC) 05/01/2020   Chronic kidney disease (CKD), active medical management without dialysis, stage 3 (moderate) (HCC) 05/01/2020   Closed torus fracture of distal end of right radius with delayed healing 08/12/2021   Delusional thoughts (HCC)    Diverticulosis of colon 05/01/2020   Epistaxis    Essential (primary) hypertension 08/18/2017   Gastroesophageal reflux disease 05/01/2020   Gastrointestinal hemorrhage    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 11/14/2019   Hypercoagulability due to atrial fibrillation (HCC) 05/01/2020   Hyperlipidemia 11/14/2019   Hypocalcemia 12/14/2020   Hypoglycemia    Hypokalemia 12/14/2020   Hypomagnesemia 12/14/2020   Hypotension 12/14/2020   Idiopathic pulmonary fibrosis (HCC) 05/01/2020   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   Iron deficiency anemia    Long term (current) use of anticoagulants    Malnutrition of mild degree [name]: 75% to less than 90% of standard weight) (HCC)    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/12/2021   Mitral and aortic incompetence 11/14/2019   Non-rheumatic atrial fibrillation (HCC) 05/01/2020   Non-toxic multinodular goiter 08/20/2020   NSVT (nonsustained ventricular tachycardia) (HCC) 08/18/2017   Oropharyngeal dysphagia    Osteoarthritis of hip 05/01/2020   Osteoarthritis of knee 05/01/2020   Osteopenia of neck of left femur    Pain of left hip  joint 12/12/2017   Proteinuria 05/01/2020   Raynaud's disease    Recurrent falls 04/09/2021   Rheumatoid arthritis (HCC)    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   Septic shock (HCC) 03/10/2020   Slow transit constipation    Systemic lupus erythematosus (HCC) 05/01/2020    Thrombophilia (HCC)    Transient ischemic attack    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   Vitamin D deficiency    Past Surgical History:  Procedure Laterality Date   BREAST BIOPSY Left    CARDIAC VALVE SURGERY     CARPAL TUNNEL RELEASE     COLONOSCOPY WITH PROPOFOL N/A 12/20/2020   Procedure: COLONOSCOPY WITH PROPOFOL;  Surgeon: Willis Modena, MD;  Location: [facility] ENDOSCOPY;  Service: Endoscopy;  Laterality: N/A;   HEMORRHOID SURGERY     PACEMAKER INSERTION     RIGHT/LEFT HEART CATH AND CORONARY ANGIOGRAPHY N/A 11/27/2020   Procedure: RIGHT/LEFT HEART CATH AND CORONARY ANGIOGRAPHY;  Surgeon: Dolores Patty, MD;  Location: [facility] INVASIVE CV LAB;  Service: Cardiovascular;  Laterality: N/A;   TONSILLECTOMY       A IV Location/Drains/Wounds Patient Lines/Drains/Airways Status     Active Line/Drains/Airways     Name Placement date Placement time Site Days   Peripheral IV 11/02/22 20 G Left Antecubital 11/02/22  1620  Antecubital  less than 1   Peripheral IV 11/02/22 20 G Anterior;Left Forearm 11/02/22  1620  Forearm  less than 1   Peripheral IV 11/02/22 18 G Anterior;Right Forearm 11/02/22  1719  Forearm  less than 1            Intake/Output Last 24 hours  Intake/Output Summary (Last 24 hours) at 11/02/2022 2111 Last data filed at 11/02/2022 1830 Gross per 24 hour  Intake 345 ml  Output --  Net 345 ml    Labs/Imaging Results for orders placed or performed during the hospital encounter of 11/02/22 (from the past 48 hour(s))  Comprehensive metabolic panel     Status: Abnormal   Collection Time: 11/02/22  3:23 PM  Result Value Ref Range   Sodium 137 135 - 145 mmol/L   Potassium 3.5 3.5 - 5.1 mmol/L   Chloride 103 98 - 111 mmol/L   CO2 26 22 - 32 mmol/L   Glucose, Bld 142 (H) 70 - 99 mg/dL    Comment: Glucose reference range applies only to samples taken after fasting for at least 8 hours.   BUN 26 (H) 8 - 23 mg/dL   Creatinine, Ser 1.61 (H) 0.44 - 1.00 mg/dL   Calcium  8.6 (L) 8.9 - 10.3 mg/dL   Total Protein 6.5 6.5 - 8.1 g/dL   Albumin 2.7 (L) 3.5 - 5.0 g/dL   AST 26 15 - 41 U/L   ALT 20 0 - 44 U/L   Alkaline Phosphatase 71 38 - 126 U/L   Total Bilirubin 0.6 0.3 - 1.2 mg/dL   GFR, Estimated 42 (L) >60 mL/min    Comment: (NOTE) Calculated using the CKD-EPI Creatinine Equation (2021)    Anion gap 8 5 - 15    Comment: Performed at [facility], [address] [place], [place] [address]  CBC with Differential     Status: Abnormal   Collection Time: 11/02/22  3:23 PM  Result Value Ref Range   WBC 11.1 (H) 4.0 - 10.5 K/uL   RBC 2.94 (L) 3.87 - 5.11 MIL/uL   Hemoglobin 9.6 (L) 12.0 - 15.0 g/dL   HCT 54.0 (  L) 36.0 - 46.0 %   MCV 102.4 (H) 80.0 - 100.0 fL   MCH 32.7 26.0 - 34.0 pg   MCHC 31.9 30.0 - 36.0 g/dL   RDW 78.2 95.6 - 21.3 %   Platelets 186 150 - 400 K/uL   nRBC 0.0 0.0 - 0.2 %   Neutrophils Relative % 70 %   Neutro Abs 7.7 1.7 - 7.7 K/uL   Lymphocytes Relative 25 %   Lymphs Abs 2.7 0.7 - 4.0 K/uL   Monocytes Relative 5 %   Monocytes Absolute 0.6 0.1 - 1.0 K/uL   Eosinophils Relative 0 %   Eosinophils Absolute 0.0 0.0 - 0.5 K/uL   Basophils Relative 0 %   Basophils Absolute 0.0 0.0 - 0.1 K/uL   Immature Granulocytes 0 %   Abs Immature Granulocytes 0.04 0.00 - 0.07 K/uL    Comment: Performed at [facility], [address] [place], [place] [address]  Type and screen [facility]     Status: None (Preliminary result)   Collection Time: 11/02/22  3:25 PM  Result Value Ref Range   ABO/RH(D) A NEG    Antibody Screen POS    Sample Expiration 11/05/2022,2359    DAT, IgG POS    Antibody Identification ANTI D    Antibody ID,T [place]      NO SPECIFIC ANTIBODY DEMONSTRATED IN ELUATE Performed at [facility], [address], [place], [place] [address]    Unit Number [id number]    Blood Component Type RED CELLS,LR    Unit division A0    Status of Unit ISSUED    Unit tag comment VERBAL ORDERS PER DR  LOCKWOOD    Transfusion Status OK TO TRANSFUSE    Crossmatch Result COMPATIBLE    Unit Number [id number]    Blood Component Type RED CELLS,LR    Unit division 00    Status of Unit ISSUED    Unit tag comment VERBAL ORDERS PER DR LOCKWOOD    Transfusion Status OK TO TRANSFUSE    Crossmatch Result COMPATIBLE    Unit Number [id number]    Blood Component Type RBC LR PHER2    Unit division 00    Status of Unit ALLOCATED    Transfusion Status OK TO TRANSFUSE    Crossmatch Result COMPATIBLE    Unit Number [id number]    Blood Component Type RBC LR PHER1    Unit division 00    Status of Unit ALLOCATED    Transfusion Status OK TO TRANSFUSE    Crossmatch Result COMPATIBLE   I-Stat Lactic Acid, ED     Status: Abnormal   Collection Time: 11/02/22  4:44 PM  Result Value Ref Range   Lactic Acid, Venous 2.0 (HH) 0.5 - 1.9 mmol/L   Comment NOTIFIED PHYSICIAN   Prepare RBC     Status: None   Collection Time: 11/02/22  4:48 PM  Result Value Ref Range   Order Confirmation      ORDER PROCESSED BY BLOOD BANK Performed at [facility], [address], [place], [place] [address]   CBG monitoring, ED     Status: None   Collection Time: 11/02/22  8:37 PM  Result Value Ref Range   Glucose-Capillary 97 70 - 99 mg/dL    Comment: Glucose reference range applies only to samples taken after fasting for at least 8 hours.   CT Angio Abd/Pel W and/or Wo Contrast  Result Date: 11/02/2022 CLINICAL DATA:  Rectal  bleeding EXAM: CTA ABDOMEN AND PELVIS WITHOUT AND WITH CONTRAST TECHNIQUE: Multidetector CT imaging of the abdomen and pelvis was performed using the standard protocol during bolus administration of intravenous contrast. Multiplanar reconstructed images and MIPs were obtained and reviewed to evaluate the vascular anatomy. RADIATION DOSE REDUCTION: This exam was performed according to the departmental dose-optimization program which includes automated exposure control, adjustment of the mA  and/or kV according to patient size and/or use of iterative reconstruction technique. CONTRAST:  75mL OMNIPAQUE IOHEXOL 350 MG/ML SOLN COMPARISON:  01/31/2020 FINDINGS: VASCULAR Normal contour and caliber of the abdominal aorta. No evidence of aneurysm, dissection, or other acute aortic pathology. 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 Moderate mixed calcific atherosclerosis. Review of the MIP images confirms the above findings. NON-VASCULAR Lower Chest: No acute findings. Gross cardiomegaly. Coronary artery calcifications Hepatobiliary: No solid liver abnormality is seen. Small gallstones. No gallbladder wall thickening, or biliary dilatation. Pancreas: Unremarkable. No pancreatic ductal dilatation or surrounding inflammatory changes. Spleen: Normal in size without significant abnormality. Adrenals/Urinary Tract: Adrenal glands are unremarkable. Kidneys are normal, without renal calculi, solid lesion, or hydronephrosis. Bladder is unremarkable. Stomach/Bowel: Stomach is within normal limits. Normal appendix. Descending and sigmoid diverticulosis. Active contrast extravasation from a diverticulum in the proximal sigmoid (series 17, image 145). Lymphatic: No enlarged abdominal or pelvic lymph nodes. Reproductive: Calcified uterine fibroids. Other: No abdominal wall hernia or abnormality. No ascites. Musculoskeletal: No acute osseous findings. IMPRESSION: 1. Active contrast extravasation from a diverticulum in the proximal sigmoid, consistent with diverticular hemorrhage. 2. Descending and sigmoid diverticulosis. 3. Cholelithiasis. 4. Calcified uterine fibroids. Call report request was placed at the time of interpretation. Report issued at this time in the interest of expediency. Final communication of critical findings will be documented. Aortic Atherosclerosis (ICD10-I70.0). Electronically Signed   By: Jearld Lesch M.D.   On:  11/02/2022 18:39    Pending Labs Unresulted Labs (From admission, onward)     Start     Ordered   11/03/22 0500  CBC  Tomorrow morning,   R        11/02/22 2025   11/03/22 0500  Basic metabolic panel  Tomorrow morning,   R        11/02/22 2025   11/02/22 2038  Hemoglobin and hematocrit, blood  Every 6 hours,   R (with TIMED occurrences)      11/02/22 2037            Vitals/Pain Today's Vitals   11/02/22 1909 11/02/22 1930 11/02/22 1930 11/02/22 2015  BP: 101/66   103/66  Pulse: 70   70  Resp: 16   (!) 26  Temp:      TempSrc:      SpO2: 100%   100%  Weight:   52.6 kg   Height:   5\' 3"  (1.6 m)   PainSc:  0-No pain      Isolation Precautions No active isolations  Medications Medications  0.9 %  sodium chloride infusion (Manually program via Guardrails IV Fluids) (0 mLs Intravenous Stopped 11/02/22 2014)  iohexol (OMNIPAQUE) 350 MG/ML injection 60 mL (has no administration in time range)  docusate sodium (COLACE) capsule 100 mg (has no administration in time range)  polyethylene glycol (MIRALAX / GLYCOLAX) packet 17 g (has no administration in time range)  pantoprazole (PROTONIX) injection 40 mg (has no administration in time range)  pantoprazole (PROTONIX) 80 mg /NS 100 mL IVPB (has no administration  in time range)  ipratropium-albuterol (DUONEB) 0.5-2.5 (3) MG/3ML nebulizer solution 3 mL (has no administration in time range)  levETIRAcetam (KEPPRA) IVPB 500 mg/100 mL premix (500 mg Intravenous New Bag/Given 11/02/22 2058)  fluticasone furoate-vilanterol (BREO ELLIPTA) 200-25 MCG/ACT 1 puff (has no administration in time range)    And  umeclidinium bromide (INCRUSE ELLIPTA) 62.5 MCG/ACT 1 puff (has no administration in time range)  influenza vaccine adjuvanted (FLUAD) injection 0.5 mL (has no administration in time range)  iohexol (OMNIPAQUE) 350 MG/ML injection 75 mL (75 mLs Intravenous Contrast Given 11/02/22 1811)  prothrombin complex conc human (KCENTRA) IVPB 1,566  Units (0 Units Intravenous Stopped 11/02/22 2033)    Mobility non-ambulatory     Focused Assessments Cardiac Assessment Handoff:    Lab Results  Component Value Date   CKTOTAL 63 07/05/2022   Lab Results  Component Value Date   DDIMER 0.58 (H) 07/03/2022   Does the Patient currently have chest pain? No    R Recommendations: See Admitting Provider Note  Report given to:   Additional Notes:  2 units of blood given. B.P's soft but stable. cooperative

## 2022-11-02 NOTE — Progress Notes (Signed)
eLink Physician-Brief Progress Note Patient Name: Lindsey Huber DOB: 06-Dec-1945 MRN: [id number]   Date of Service  11/02/2022  HPI/Events of Note  77 year old female with a history of atrial fibrillation on chronic Xarelto with a recent admission for diverticular bleed complicated by hemorrhagic shock who presents with lower GI bleed with dark red blood per rectum with associated clots.  3 g drop in hemoglobin.  Status post Kcentra, vitamin K and 2 units PRBC.  Diverticular bleed noted on CT angiogram.  On examination, mild tachypnea to 22 otherwise normal vitals on room air.  Metabolic panel with mild hypokalemia, elevated creatinine, lactic acid at 2.  Hemoglobin 9.6 which is a 3 g drop compared to previous assessments.  Mild leukocytosis.  Macrocytosis.  eICU Interventions  Xarelto held and reversed.  Anticipating colonoscopy but if there is a clinical decline, can consider IR guided embolization.  Trend hemoglobin every 6 hours  QTc prolonged over 500.  Ongoing nausea-will do a limited course of Zofran.  PPI IV twice daily SCDs for DVT prophylaxis     Intervention Category Minor Interventions: Routine modifications to care plan (e.g. PRN medications for pain, fever) Evaluation Type: New Patient Evaluation  Lindsey Huber 11/02/2022, 10:17 PM

## 2022-11-02 NOTE — H&P (Signed)
NAME:  Lindsey Huber, MRN:  [id number], DOB:  03/16/1945, LOS: 0 ADMISSION DATE:  11/02/2022, CONSULTATION DATE:  10/8 REFERRING MD:  Dr. Jeraldine Loots, CHIEF COMPLAINT:  GIB   History of Present Illness:  Patient is a 77 yo F w/ A-fib on Xarelto, SSS/PPM, ILD/pulmonary fibrosis/lupus followed by [name], COPD, diastolic CHF, OSA, aortic, mitral, tricuspid valve disease s/p AVR/MV ring/TVR, CKD 3 AA presents to [facility] ED on 10/8 with GIB.  Patient recently admitted on 06/2022 with hematochezia and hemorrhagic shock.  Patient given Kcentra, vitamin K and pressors.CTA showing extravasation at the junction of descending and sigmoid colon and duodenal enteritis.  On 10/8 presents to [facility] ED w/ hematochezia and near syncope. BP stable 120/71. Complaining of some abdominal tenderness. Hgb 9.6 from 12.2 7 days ago. Given Kcentra and vitamin K. Given 2 units of PRBCs. CTA abd/pelvis w/ extravasation from diverticulum in proximal sigmoid, consistent w/ diverticulr hemorrhage. GI consulted. PCCM consulted.  Pertinent  Medical History   Past Medical History:  Diagnosis Date   Achalasia    Acquired hypothyroidism 08/20/2020   Acute metabolic encephalopathy 07/25/2021   AICD (automatic cardioverter/defibrillator) present 11/14/2019   Allergic rhinitis    Ankle fracture 03/25/2022   Aortic valve disorder 03/26/2002   Atherosclerosis of abdominal aorta (HCC) 05/01/2020   Cardiomyopathy (HCC)    CHB (complete heart block) (HCC) 08/18/2017   CHF (congestive heart failure) (HCC)    Cholelithiasis without obstruction 05/01/2020   Chronic gouty arthritis    Chronic heart failure with preserved ejection fraction (HCC) 05/01/2020   Chronic kidney disease (CKD), active medical management without dialysis, stage 3 (moderate) (HCC) 05/01/2020   Closed torus fracture of distal end of right radius with delayed healing 08/12/2021   Delusional thoughts (HCC)    Diverticulosis of colon 05/01/2020   Epistaxis     Essential (primary) hypertension 08/18/2017   Gastroesophageal reflux disease 05/01/2020   Gastrointestinal hemorrhage    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 11/14/2019   Hypercoagulability due to atrial fibrillation (HCC) 05/01/2020   Hyperlipidemia 11/14/2019   Hypocalcemia 12/14/2020   Hypoglycemia    Hypokalemia 12/14/2020   Hypomagnesemia 12/14/2020   Hypotension 12/14/2020   Idiopathic pulmonary fibrosis (HCC) 05/01/2020   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   Iron deficiency anemia    Long term (current) use of anticoagulants    Malnutrition of mild degree [name]: 75% to less than 90% of standard weight) (HCC)    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/12/2021   Mitral and aortic incompetence 11/14/2019   Non-rheumatic atrial fibrillation (HCC) 05/01/2020   Non-toxic multinodular goiter 08/20/2020   NSVT (nonsustained ventricular tachycardia) (HCC) 08/18/2017   Oropharyngeal dysphagia    Osteoarthritis of hip 05/01/2020   Osteoarthritis of knee 05/01/2020   Osteopenia of neck of left femur    Pain of left hip joint 12/12/2017   Proteinuria 05/01/2020   Raynaud's disease    Recurrent falls 04/09/2021   Rheumatoid arthritis (HCC)    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 rate 16, height 5\' 3"  (1.6 m), weight 52.6 kg, SpO2 100%.        Intake/Output  Summary (Last 24 hours) at 11/02/2022 1937 Last data filed at 11/02/2022 1830 Gross per 24 hour  Intake 345 ml  Output --  Net 345 ml   Filed Weights   11/02/22 1930  Weight: 52.6 kg    Examination: General: elderly appearing female in NAD HEENT: MM pink/moist Neuro: Aox3; MAE CV: s1s2, irregular rate 70s, no m/r/g PULM:  dim clear BS bilaterally; RA GI: soft, bsx4 active; mild tenderness upon palpation Extremities: warm/dry, no edema  Skin: no rashes or lesions    Resolved Hospital Problem list     Assessment & Plan:   Acute Lower diverticular GIB on xarelto ABLA -s/p kcentra and vit K -transfused 2 units PRBCs Plan: -serial h/h -transfuse for hgb <7 or hemodynamically unstable -NPO -GI consulted; will see in am -PPI bid  Atrial Fibrillation on xarelto HFpEF Valvular disease w/prior bioprosthetic AVR/MV ring/bioprosthetic TVR 09/25/2010  SSS s/p ppm Plan: -tele monitoring -hold xarelto -hold anti-hypertensive's and diuretics with soft bp -daily weights; strict I/o's  COPD/ILD OSA not on cpap Plan: -cont breztri and prn duoneb  CKD stage 3a Plan: -Trend BMP / urinary output -Replace electrolytes as indicated -Avoid nephrotoxic agents, ensure adequate renal perfusion  Lupus Plan: -hold home pred and plaguenil  Hx of seizures Plan: -resume home keppra iv and transition to po when able to take po  Hypothyroidism Plan: -resume home synthroid when able to take po  Best Practice (right click and "Reselect all SmartList Selections" daily)   Diet/type: NPO DVT prophylaxis: SCD GI prophylaxis: PPI Lines: N/A Foley:  N/A Code Status:  full code Last date of multidisciplinary goals of care discussion [10/8 updated patient and daughter at bedside. States she would like to be full code.]  Labs   CBC: Recent Labs  Lab 11/02/22 1523  WBC 11.1*  NEUTROABS 7.7  HGB 9.6*  HCT 30.1*  MCV 102.4*  PLT 186    Basic Metabolic Panel: Recent Labs   Lab 10/27/22 0300 11/02/22 1523  NA 138 137  K 3.6 3.5  CL 100 103  CO2 26 26  GLUCOSE 71 142*  BUN 20 26*  CREATININE 1.07* 1.33*  CALCIUM 9.0 8.6*  MG 2.1  --   PHOS 4.3  --    GFR: Estimated Creatinine Clearance: 30.2 mL/min (A) (by C-G formula based on SCr of 1.33 mg/dL (H)). Recent Labs  Lab 11/02/22 1523 11/02/22 1644  WBC 11.1*  --   LATICACIDVEN  --  2.0*    Liver Function Tests: Recent Labs  Lab 11/02/22 1523  AST 26  ALT 20  ALKPHOS 71  BILITOT 0.6  PROT 6.5  ALBUMIN 2.7*   No results for input(s): "LIPASE", "AMYLASE" in the last 168 hours. No results for input(s): "AMMONIA" in the last 168 hours.  ABG    Component Value Date/Time   HCO3 26.2 11/27/2020 1258   TCO2 27 10/26/2022 1542   O2SAT 82.0 11/27/2020 1258     Coagulation Profile: No results for input(s): "INR", "PROTIME" in the last 168 hours.  Cardiac Enzymes: No results for input(s): "CKTOTAL", "CKMB", "CKMBINDEX", "TROPONINI" in the last 168 hours.  HbA1C: Hgb A1c MFr Bld  Date/Time Value Ref Range Status  07/02/2022 09:40 AM 5.2 4.8 - 5.6 % Final    Comment:    (  NOTE) Pre diabetes:          5.7%-6.4%  Diabetes:              >6.4%  Glycemic control for   <7.0% adults with diabetes   07/03/2020 01:50 PM 5.4 4.6 - 6.5 % Final    Comment:    Glycemic Control Guidelines for People with Diabetes:Non Diabetic:  <6%Goal of Therapy: <7%Additional Action Suggested:  >8%     CBG: No results for input(s): "GLUCAP" in the last 168 hours.  Review of Systems:   Review of Systems  Constitutional:  Negative for fever.  Respiratory:  Negative for sputum production and shortness of breath.   Cardiovascular:  Negative for chest pain.  Gastrointestinal:  Positive for abdominal pain, blood in stool, constipation, nausea and vomiting. Negative for diarrhea.     Past Medical History:  She,  has a past medical history of Achalasia, Acquired hypothyroidism (08/20/2020), Acute metabolic  encephalopathy (07/25/2021), AICD (automatic cardioverter/defibrillator) present (11/14/2019), Allergic rhinitis, Ankle fracture (03/25/2022), Aortic valve disorder (03/26/2002), Atherosclerosis of abdominal aorta (HCC) (05/01/2020), Cardiomyopathy (HCC), CHB (complete heart block) (HCC) (08/18/2017), CHF (congestive heart failure) (HCC), Cholelithiasis without obstruction (05/01/2020), Chronic gouty arthritis, Chronic heart failure with preserved ejection fraction (HCC) (05/01/2020), Chronic kidney disease (CKD), active medical management without dialysis, stage 3 (moderate) (HCC) (05/01/2020), Closed torus fracture of distal end of right radius with delayed healing (08/12/2021), Delusional thoughts (HCC), Diverticulosis of colon (05/01/2020), Epistaxis, Essential (primary) hypertension (08/18/2017), Gastroesophageal reflux disease (05/01/2020), Gastrointestinal hemorrhage, 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 (11/14/2019), Hypercoagulability due to atrial fibrillation (HCC) (05/01/2020), Hyperlipidemia (11/14/2019), Hypocalcemia (12/14/2020), Hypoglycemia, Hypokalemia (12/14/2020), Hypomagnesemia (12/14/2020), Hypotension (12/14/2020), Idiopathic pulmonary fibrosis (HCC) (05/01/2020), 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05/01/2020), Iron deficiency anemia, Long term (current) use of anticoagulants, Malnutrition of mild degree (Gomez: 75% to less than 90% of standard weight) (HCC), Mild dementia (HCC) (08/12/2021), Mitral and aortic incompetence (11/14/2019), Non-rheumatic atrial fibrillation (HCC) (05/01/2020), Non-toxic multinodular goiter (08/20/2020), NSVT (nonsustained ventricular tachycardia) (HCC) (08/18/2017), Oropharyngeal dysphagia, Osteoarthritis of hip (05/01/2020), Osteoarthritis of knee (05/01/2020), Osteopenia of neck of left femur, Pain of left hip joint (12/12/2017), Proteinuria (05/01/2020), Raynaud's disease, Recurrent falls (04/09/2021), Rheumatoid arthritis (HCC), Secondary  adrenal insufficiency (HCC) (05/01/2020), 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   Surgical History:   Past Surgical History:  Procedure Laterality Date   BREAST BIOPSY Left    CARDIAC VALVE SURGERY     CARPAL TUNNEL RELEASE     COLONOSCOPY WITH PROPOFOL N/A 12/20/2020   Procedure: COLONOSCOPY WITH PROPOFOL;  Surgeon: Willis Modena, MD;  Location: [facility] ENDOSCOPY;  Service: Endoscopy;  Laterality: N/A;   HEMORRHOID SURGERY     PACEMAKER INSERTION     RIGHT/LEFT HEART CATH AND CORONARY ANGIOGRAPHY N/A 11/27/2020   Procedure: RIGHT/LEFT HEART CATH AND CORONARY ANGIOGRAPHY;  Surgeon: Dolores Patty, MD;  Location: [facility] INVASIVE CV LAB;  Service: Cardiovascular;  Laterality: N/A;   TONSILLECTOMY       Social History:   reports that she has never smoked. She has been exposed to tobacco smoke. She has never used smokeless tobacco. She reports that she does not drink alcohol and does not use drugs.   Family History:  Her family history includes Cancer in her brother; Diabetes in her brother; Heart disease in her father, mother, and sister; Hypertension in her father and mother; Osteoarthritis in her father and mother;  Rheum arthritis in her mother.   Allergies Allergies  Allergen Reactions   Other Other (See Comments)    Patient is to NOT EAT any foods with husks or tree nuts, popcorn   Propofol Shortness Of Breath and Other (See Comments)    "Caused asthma"   Flecainide Other (See Comments)    Reaction not recalled   Amiodarone Other (See Comments)    Delirium/Confusion/Psychosis   Amlodipine Other (See Comments)    "Tired and syncope"     Home Medications  Prior to Admission medications   Medication Sig Start Date End Date Taking? Authorizing Provider   acetaminophen (TYLENOL) 500 MG tablet Take 1,000 mg by mouth every 6 (six) hours as needed (pain).   Yes [provider]  albuterol (VENTOLIN HFA) 108 (90 Base) MCG/ACT inhaler Inhale 2 puffs into the lungs every 6 (six) hours as needed for wheezing or shortness of breath.   Yes [provider]  allopurinol (ZYLOPRIM) 100 MG tablet TAKE 1 TABLET BY MOUTH EVERY DAY 06/01/22  Yes Rice, Jamesetta Orleans, MD  amoxicillin (AMOXIL) 500 MG capsule 4 capsules by mouth 1 hour before dental procedure. 12/30/21  Yes [provider]  Budeson-Glycopyrrol-Formoterol (BREZTRI AEROSPHERE) 160-9-4.8 MCG/ACT AERO Inhale 2 puffs into the lungs 2 (two) times daily as needed ("for flares").   Yes [provider]  calcium-vitamin D (OSCAL WITH D) 500-5 MG-MCG tablet Take 2 tablets by mouth 2 (two) times daily. 07/10/22  Yes Elgergawy, Leana Roe, MD  cetirizine HCl (ZYRTEC) 5 MG/5ML SOLN Take 1 mL by mouth 2 (two) times daily as needed for rhinitis or allergies.   Yes [provider]  Cholecalciferol (VITAMIN D3) 25 MCG (1000 UT) CAPS Take 1,000 Units by mouth daily with lunch.   Yes [provider]  fluticasone (FLONASE) 50 MCG/ACT nasal spray PLACE 1 SPRAY INTO BOTH NOSTRILS 2 (TWO) TIMES DAILY Patient taking differently: Place 1 spray into both nostrils daily as needed for allergies. 05/11/22  Yes Hunsucker, Lesia Sago, MD  furosemide (LASIX) 40 MG tablet Take 1 tablet (40 mg total) by mouth daily. 07/30/22  Yes Milford, Anderson Malta, FNP  Glycerin-Hypromellose-PEG 400 (DRY EYE RELIEF DROPS OP) Place 1 drop into both eyes daily as needed (for dry eye).   Yes [provider]  hydroxychloroquine (PLAQUENIL) 200 MG tablet TAKE 1 TABLET BY MOUTH EVERY DAY 09/21/22  Yes Rice, Jamesetta Orleans, MD  levETIRAcetam (KEPPRA) 500 MG tablet Take 1 tablet (500 mg total) by mouth 2 (two) times daily. 10/27/22  Yes Pokhrel, Laxman, MD  levothyroxine (SYNTHROID) 25 MCG tablet Take 1 tablet (25  mcg total) by mouth daily. 11/06/21  Yes Shamleffer, Konrad Dolores, MD  magnesium oxide (MAG-OX) 400 (240 Mg) MG tablet TAKE 1 TABLET BY MOUTH EVERY DAY 08/25/22  Yes Lanier Prude, MD  metoprolol tartrate (LOPRESSOR) 25 MG tablet Take 0.5 tablets (12.5 mg total) by mouth 2 (two) times daily. 07/10/22  Yes Elgergawy, Leana Roe, MD  Nintedanib (OFEV) 150 MG CAPS Take 150 mg by mouth at bedtime.   Yes [provider]  OLANZapine (ZYPREXA) 2.5 MG tablet TAKE 1 TABLET BY MOUTH EVERYDAY AT BEDTIME Patient taking differently: Take 2.5 mg by mouth at bedtime. 11/01/22  Yes Jaffe, Adam R, DO  pantoprazole (PROTONIX) 40 MG tablet Take 1 tablet (40 mg total) by mouth 2 (two) times daily. 07/10/22  Yes Elgergawy, Leana Roe, MD  polyethylene glycol powder (MIRALAX) 17 GM/SCOOP powder Take 17 g by mouth daily as needed  for mild constipation. 07/03/21  Yes [provider]  potassium chloride (KLOR-CON M10) 10 MEQ tablet TAKE 2 TABLETS BY MOUTH DAILY 06/01/22  Yes Sheilah Pigeon, PA-C  predniSONE (DELTASONE) 5 MG tablet TAKE 1 TABLET (5 MG TOTAL) BY MOUTH DAILY WITH BREAKFAST. PATIENT TO DOUBLE UP DOSE DURING SICK DAY RULE 08/19/22  Yes Shamleffer, Konrad Dolores, MD  XARELTO 15 MG TABS tablet TAKE 1 TABLET (15 MG TOTAL) BY MOUTH DAILY WITH SUPPER Patient taking differently: Take 15 mg by mouth daily with supper. 09/20/22  Yes Sheilah Pigeon, PA-C     Critical care time: 45 minutes     JD Daryel [facility] 11/02/2022, 7:37 PM  Please see [email or web address] for pager details.  From 7A-7P if no response, please call [phone]. After hours, please call ELink [phone].

## 2022-11-02 NOTE — ED Provider Notes (Signed)
[place] EMERGENCY DEPARTMENT AT [facility] Provider Note   CSN: [id number] Arrival date & time: 11/02/22  1521     History  Chief Complaint  Patient presents with   Rectal Bleeding    Lindsey Huber is a 77 y.o. female.  HPI Patient with multiple medical problems including anticoagulation due to history of A-fib now presents with concern for weakness, lightheadedness, near syncope and rectal bleeding. She was in her usual state of health until earlier today.  She notes that after having a sensation that she needed to have flatus she had multiple episodes of bright red blood per rectum.  No complete syncope, no complete fall.  EMS reports that there was blood clots and blood in the patient's undergarments on their arrival.  She was awake and alert throughout transport with mild tachypnea.  Patient notes abdominal pain, waxing, waning today.    Home Medications Prior to Admission medications   Medication Sig Start Date End Date Taking? Authorizing Provider  acetaminophen (TYLENOL) 500 MG tablet Take 1,000 mg by mouth every 6 (six) hours as needed (pain).   Yes [provider]  albuterol (VENTOLIN HFA) 108 (90 Base) MCG/ACT inhaler Inhale 2 puffs into the lungs every 6 (six) hours as needed for wheezing or shortness of breath.   Yes [provider]  allopurinol (ZYLOPRIM) 100 MG tablet TAKE 1 TABLET BY MOUTH EVERY DAY 06/01/22  Yes Rice, Jamesetta Orleans, MD  amoxicillin (AMOXIL) 500 MG capsule 4 capsules by mouth 1 hour before dental procedure. 12/30/21  Yes [provider]  Budeson-Glycopyrrol-Formoterol (BREZTRI AEROSPHERE) 160-9-4.8 MCG/ACT AERO Inhale 2 puffs into the lungs 2 (two) times daily as needed ("for flares").   Yes [provider]  calcium-vitamin D (OSCAL WITH D) 500-5 MG-MCG tablet Take 2 tablets by mouth 2 (two) times daily. 07/10/22  Yes Elgergawy, Leana Roe, MD  cetirizine HCl (ZYRTEC) 5 MG/5ML SOLN Take 1 mL by mouth 2 (two)  times daily as needed for rhinitis or allergies.   Yes [provider]  Cholecalciferol (VITAMIN D3) 25 MCG (1000 UT) CAPS Take 1,000 Units by mouth daily with lunch.   Yes [provider]  fluticasone (FLONASE) 50 MCG/ACT nasal spray PLACE 1 SPRAY INTO BOTH NOSTRILS 2 (TWO) TIMES DAILY Patient taking differently: Place 1 spray into both nostrils daily as needed for allergies. 05/11/22  Yes Hunsucker, Lesia Sago, MD  furosemide (LASIX) 40 MG tablet Take 1 tablet (40 mg total) by mouth daily. 07/30/22  Yes Milford, Anderson Malta, FNP  Glycerin-Hypromellose-PEG 400 (DRY EYE RELIEF DROPS OP) Place 1 drop into both eyes daily as needed (for dry eye).   Yes [provider]  hydroxychloroquine (PLAQUENIL) 200 MG tablet TAKE 1 TABLET BY MOUTH EVERY DAY 09/21/22  Yes Rice, Jamesetta Orleans, MD  levETIRAcetam (KEPPRA) 500 MG tablet Take 1 tablet (500 mg total) by mouth 2 (two) times daily. 10/27/22  Yes Pokhrel, Laxman, MD  levothyroxine (SYNTHROID) 25 MCG tablet Take 1 tablet (25 mcg total) by mouth daily. 11/06/21  Yes Shamleffer, Konrad Dolores, MD  magnesium oxide (MAG-OX) 400 (240 Mg) MG tablet TAKE 1 TABLET BY MOUTH EVERY DAY 08/25/22  Yes Lanier Prude, MD  metoprolol tartrate (LOPRESSOR) 25 MG tablet Take 0.5 tablets (12.5 mg total) by mouth 2 (two) times daily. 07/10/22  Yes Elgergawy, Leana Roe, MD  Nintedanib (OFEV) 150 MG CAPS Take 150 mg by mouth at bedtime.   Yes [provider]  OLANZapine (ZYPREXA) 2.5 MG tablet TAKE  1 TABLET BY MOUTH EVERYDAY AT BEDTIME Patient taking differently: Take 2.5 mg by mouth at bedtime. 11/01/22  Yes Jaffe, Adam R, DO  pantoprazole (PROTONIX) 40 MG tablet Take 1 tablet (40 mg total) by mouth 2 (two) times daily. 07/10/22  Yes Elgergawy, Leana Roe, MD  polyethylene glycol powder (MIRALAX) 17 GM/SCOOP powder Take 17 g by mouth daily as needed for mild constipation. 07/03/21  Yes [provider]  potassium chloride (KLOR-CON M10) 10 MEQ  tablet TAKE 2 TABLETS BY MOUTH DAILY 06/01/22  Yes Sheilah Pigeon, PA-C  predniSONE (DELTASONE) 5 MG tablet TAKE 1 TABLET (5 MG TOTAL) BY MOUTH DAILY WITH BREAKFAST. PATIENT TO DOUBLE UP DOSE DURING SICK DAY RULE 08/19/22  Yes Shamleffer, Konrad Dolores, MD  XARELTO 15 MG TABS tablet TAKE 1 TABLET (15 MG TOTAL) BY MOUTH DAILY WITH SUPPER Patient taking differently: Take 15 mg by mouth daily with supper. 09/20/22  Yes Sheilah Pigeon, PA-C      Allergies    Other, Propofol, Flecainide, Amiodarone, and Amlodipine    Review of Systems   Review of Systems  All other systems reviewed and are negative.   Physical Exam Updated Vital Signs BP 101/66   Pulse 70   Temp 98.3 F (36.8 C) (Oral)   Resp 16   Ht 5\' 3"  (1.6 m)   Wt 52.6 kg   SpO2 100%   BMI 20.55 kg/m  Physical Exam Vitals and nursing note reviewed.  Constitutional:      General: She is not in acute distress.    Appearance: She is well-developed. She is ill-appearing. She is not toxic-appearing.  HENT:     Head: Normocephalic and atraumatic.  Eyes:     Conjunctiva/sclera: Conjunctivae normal.  Cardiovascular:     Rate and Rhythm: Normal rate and regular rhythm.  Pulmonary:     Effort: Pulmonary effort is normal. No respiratory distress.     Breath sounds: Normal breath sounds. No stridor.  Abdominal:     General: There is no distension.     Tenderness: There is abdominal tenderness.  Skin:    General: Skin is warm and dry.  Neurological:     Mental Status: She is alert and oriented to person, place, and time.     Cranial Nerves: No cranial nerve deficit.  Psychiatric:        Mood and Affect: Mood normal.     ED Results / Procedures / Treatments   Labs (all labs ordered are listed, but only abnormal results are displayed) Labs Reviewed  COMPREHENSIVE METABOLIC PANEL - Abnormal; Notable for the following components:      Result Value   Glucose, Bld 142 (*)    BUN 26 (*)    Creatinine, Ser 1.33 (*)     Calcium 8.6 (*)    Albumin 2.7 (*)    GFR, Estimated 42 (*)    All other components within normal limits  CBC WITH DIFFERENTIAL/PLATELET - Abnormal; Notable for the following components:   WBC 11.1 (*)    RBC 2.94 (*)    Hemoglobin 9.6 (*)    HCT 30.1 (*)    MCV 102.4 (*)    All other components within normal limits  I-STAT CG4 LACTIC ACID, ED - Abnormal; Notable for the following components:   Lactic Acid, Venous 2.0 (*)    All other components within normal limits  TYPE AND SCREEN  PREPARE RBC (CROSSMATCH)    EKG EKG Interpretation Date/Time:  Tuesday November 02 2022 15:27:27 EDT Ventricular Rate:  70 PR Interval:    QRS Duration:  146 QT Interval:  471 QTC Calculation: 509 R Axis:   250  Text Interpretation: VENTRICULAR PACED RHYTHM Confirmed by Gerhard Munch [phone]) on 11/02/2022 3:31:41 PM  Radiology CT Angio Abd/Pel W and/or Wo Contrast  Result Date: 11/02/2022 CLINICAL DATA:  Rectal bleeding EXAM: CTA ABDOMEN AND PELVIS WITHOUT AND WITH CONTRAST TECHNIQUE: Multidetector CT imaging of the abdomen and pelvis was performed using the standard protocol during bolus administration of intravenous contrast. Multiplanar reconstructed images and MIPs were obtained and reviewed to evaluate the vascular anatomy. RADIATION DOSE REDUCTION: This exam was performed according to the departmental dose-optimization program which includes automated exposure control, adjustment of the mA and/or kV according to patient size and/or use of iterative reconstruction technique. CONTRAST:  75mL OMNIPAQUE IOHEXOL 350 MG/ML SOLN COMPARISON:  01/31/2020 FINDINGS: VASCULAR Normal contour and caliber of the abdominal aorta. No evidence of aneurysm, dissection, or other acute aortic pathology. 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 Moderate mixed calcific atherosclerosis. Review of the MIP images confirms the above  findings. NON-VASCULAR Lower Chest: No acute findings. Gross cardiomegaly. Coronary artery calcifications Hepatobiliary: No solid liver abnormality is seen. Small gallstones. No gallbladder wall thickening, or biliary dilatation. Pancreas: Unremarkable. No pancreatic ductal dilatation or surrounding inflammatory changes. Spleen: Normal in size without significant abnormality. Adrenals/Urinary Tract: Adrenal glands are unremarkable. Kidneys are normal, without renal calculi, solid lesion, or hydronephrosis. Bladder is unremarkable. Stomach/Bowel: Stomach is within normal limits. Normal appendix. Descending and sigmoid diverticulosis. Active contrast extravasation from a diverticulum in the proximal sigmoid (series 17, image 145). Lymphatic: No enlarged abdominal or pelvic lymph nodes. Reproductive: Calcified uterine fibroids. Other: No abdominal wall hernia or abnormality. No ascites. Musculoskeletal: No acute osseous findings. IMPRESSION: 1. Active contrast extravasation from a diverticulum in the proximal sigmoid, consistent with diverticular hemorrhage. 2. Descending and sigmoid diverticulosis. 3. Cholelithiasis. 4. Calcified uterine fibroids. Call report request was placed at the time of interpretation. Report issued at this time in the interest of expediency. Final communication of critical findings will be documented. Aortic Atherosclerosis (ICD10-I70.0). Electronically Signed   By: Jearld Lesch M.D.   On: 11/02/2022 18:39    Procedures Procedures    Medications Ordered in ED Medications  0.9 %  sodium chloride infusion (Manually program via Guardrails IV Fluids) (0 mLs Intravenous Stopped 11/02/22 2014)  iohexol (OMNIPAQUE) 350 MG/ML injection 60 mL (has no administration in time range)  prothrombin complex conc human (KCENTRA) IVPB 1,566 Units (1,566 Units Intravenous New Bag/Given 11/02/22 2003)  iohexol (OMNIPAQUE) 350 MG/ML injection 75 mL (75 mLs Intravenous Contrast Given 11/02/22 1811)    ED  Course/ Medical Decision Making/ A&P                                 Medical Decision Making Elderly female, anticoagulated presents with rectal bleeding, abdominal pain and near syncope.  Differential includes diverticulosis versus vascular disruption versus infection. Patient placed on monitors, type and screen sent core fluids, monitoring, labs. Cardiac 75 sinus normal Pulse ox 100% room air normal   Amount and/or Complexity of Data Reviewed Independent Historian: EMS External Data Reviewed: notes. Labs: ordered. Decision-making details documented in ED Course. Radiology: ordered and independent interpretation performed. Decision-making details documented in ED Course.  Risk Prescription drug management. Decision regarding hospitalization. Diagnosis or treatment  significantly limited by social determinants of health.   Update: Patient's MAP continues to be about 60.  After some difficulty the patient will have emergency release blood given her ongoing bleed, hypotension, near syncope, and Xarelto use.  Update:, CTA with active extravasation from a diverticulum.  I discussed the patient's case with interventional radiology, and with patient's gastroenterology team.  Gastroenterology will see the patient as a consulting service.  Given concern for ongoing bleed, with Xarelto patient has received Theodoro Parma which I discussed with our pharmacy team.  With Xarelto use, Kcentra reversal, ongoing bleed patient will require admission to the ICU. 8:19 PM Now accompanied by her daughter.  Patient similar in appearance, MAP now 80 after almost 2 full liters resuscitation.  Family, patient aware of all findings, need for admission, plan.   Final Clinical Impression(s) / ED Diagnoses Final diagnoses:  Acute GI bleeding   CRITICAL CARE Performed by: Gerhard Munch Total critical care time: 40 minutes Critical care time was exclusive of separately billable procedures and treating other  patients. Critical care was necessary to treat or prevent imminent or life-threatening deterioration. Critical care was time spent personally by me on the following activities: development of treatment plan with patient and/or surrogate as well as nursing, discussions with consultants, evaluation of patient's response to treatment, examination of patient, obtaining history from patient or surrogate, ordering and performing treatments and interventions, ordering and review of laboratory studies, ordering and review of radiographic studies, pulse oximetry and re-evaluation of patient's condition.    Gerhard Munch, MD 11/02/22 2020

## 2022-11-03 ENCOUNTER — Ambulatory Visit: Payer: Medicare HMO | Admitting: Internal Medicine

## 2022-11-03 DIAGNOSIS — K922 Gastrointestinal hemorrhage, unspecified: Secondary | ICD-10-CM

## 2022-11-03 LAB — CBC
HCT: 33.1 % — ABNORMAL LOW (ref 36.0–46.0)
Hemoglobin: 11.4 g/dL — ABNORMAL LOW (ref 12.0–15.0)
MCH: 31.6 pg (ref 26.0–34.0)
MCHC: 34.4 g/dL (ref 30.0–36.0)
MCV: 91.7 fL (ref 80.0–100.0)
Platelets: 141 10*3/uL — ABNORMAL LOW (ref 150–400)
RBC: 3.61 MIL/uL — ABNORMAL LOW (ref 3.87–5.11)
RDW: 16.5 % — ABNORMAL HIGH (ref 11.5–15.5)
WBC: 10.2 10*3/uL (ref 4.0–10.5)
nRBC: 0 % (ref 0.0–0.2)

## 2022-11-03 LAB — BASIC METABOLIC PANEL
Anion gap: 15 (ref 5–15)
BUN: 31 mg/dL — ABNORMAL HIGH (ref 8–23)
CO2: 23 mmol/L (ref 22–32)
Calcium: 8.7 mg/dL — ABNORMAL LOW (ref 8.9–10.3)
Chloride: 102 mmol/L (ref 98–111)
Creatinine, Ser: 1.2 mg/dL — ABNORMAL HIGH (ref 0.44–1.00)
GFR, Estimated: 47 mL/min — ABNORMAL LOW (ref >60)
Glucose, Bld: 102 mg/dL — ABNORMAL HIGH (ref 70–99)
Potassium: 4.2 mmol/L (ref 3.5–5.1)
Sodium: 140 mmol/L (ref 135–145)

## 2022-11-03 LAB — GLUCOSE, CAPILLARY
Glucose-Capillary: 75 mg/dL (ref 70–99)
Glucose-Capillary: 80 mg/dL (ref 70–99)
Glucose-Capillary: 87 mg/dL (ref 70–99)
Glucose-Capillary: 180 mg/dL — ABNORMAL HIGH (ref 70–99)
Glucose-Capillary: 114 mg/dL — ABNORMAL HIGH (ref 70–99)

## 2022-11-03 LAB — HEMOGLOBIN AND HEMATOCRIT, BLOOD
HCT: 33.6 % — ABNORMAL LOW (ref 36.0–46.0)
Hemoglobin: 11.6 g/dL — ABNORMAL LOW (ref 12.0–15.0)

## 2022-11-03 MED ORDER — LEVOTHYROXINE SODIUM 25 MCG PO TABS
25.0000 ug | ORAL_TABLET | Freq: Every day | ORAL | Status: DC
  Administered 2022-11-04 – 2022-11-11 (×8): 25 ug via ORAL
  Filled 2022-11-03 (×8): qty 1

## 2022-11-03 MED ORDER — PREDNISONE 5 MG PO TABS
5.0000 mg | ORAL_TABLET | Freq: Every day | ORAL | Status: DC
  Administered 2022-11-04 – 2022-11-11 (×8): 5 mg via ORAL
  Filled 2022-11-03 (×8): qty 1

## 2022-11-03 MED ORDER — ALLOPURINOL 100 MG PO TABS
100.0000 mg | ORAL_TABLET | Freq: Every day | ORAL | Status: DC
  Administered 2022-11-03 – 2022-11-11 (×9): 100 mg via ORAL
  Filled 2022-11-03 (×9): qty 1

## 2022-11-03 MED ORDER — HYDROXYCHLOROQUINE SULFATE 200 MG PO TABS
200.0000 mg | ORAL_TABLET | Freq: Every day | ORAL | Status: DC
  Administered 2022-11-03 – 2022-11-11 (×9): 200 mg via ORAL
  Filled 2022-11-03 (×9): qty 1

## 2022-11-03 MED ORDER — LEVETIRACETAM 500 MG PO TABS
500.0000 mg | ORAL_TABLET | Freq: Two times a day (BID) | ORAL | Status: DC
  Administered 2022-11-03 – 2022-11-11 (×17): 500 mg via ORAL
  Filled 2022-11-03 (×17): qty 1

## 2022-11-03 NOTE — Progress Notes (Addendum)
NAME:  Lindsey Huber, MRN:  [id number], DOB:  Apr 08, 1945, LOS: 1 ADMISSION DATE:  11/02/2022, CONSULTATION DATE:  11/02/2022 REFERRING MD:  Dr. Jeraldine Loots, CHIEF COMPLAINT:  Lower GI bleed    History of Present Illness:  Patient is a 77 yo F w/ A-fib on Xarelto, SSS/PPM, ILD/pulmonary fibrosis/lupus followed by [name], COPD, diastolic CHF, OSA, aortic, mitral, tricuspid valve disease s/p AVR/MV ring/TVR, CKD 3 AA presents to [facility] ED on 10/8 with GIB.   Patient recently admitted on 06/2022 with hematochezia and hemorrhagic shock.  Patient given Kcentra, vitamin K and pressors.CTA showing extravasation at the junction of descending and sigmoid colon and duodenal enteritis.   On 10/8 presents to [facility] ED w/ hematochezia and near syncope. BP stable 120/71. Complaining of some abdominal tenderness. Hgb 9.6 from 12.2 7 days ago. Given Kcentra. Given 2 units of PRBCs. CTA abd/pelvis w/ extravasation from diverticulum in proximal sigmoid, consistent w/ diverticulr hemorrhage. GI consulted. PCCM consulted.  Pertinent  Medical History  A-fib on Xarelto, SSS/PPM, ILD/pulmonary fibrosis/lupus followed by [name], COPD, diastolic CHF, OSA, aortic, mitral, tricuspid valve disease s/p AVR/MV ring/TVR, CKD 3 A   Significant Hospital Events: Including procedures, antibiotic start and stop dates in addition to other pertinent events   10/8: Admitted to ICU for diverticular bleed, status post 2 units of PRBCs  Interim History / Subjective:  Overnight events: Admitted to ICU overnight, given Kcentra  Patient evaluated at bedside this morning.  Patient states she is no longer having bloody bowel movements.  She states she is doing well.  She has no concerns this morning.  Objective   Blood pressure 112/83, pulse 70, temperature 98.1 F (36.7 C), temperature source Oral, resp. rate (!) 26, height 5\' 3"  (1.6 m), weight 54.7 kg, SpO2 97%.       Intake/Output Summary (Last 24 hours) at 11/03/2022  1241 Last data filed at 11/03/2022 1058 Gross per 24 hour  Intake 805 ml  Output --  Net 805 ml  No documented urine output  Filed Weights   11/02/22 1930 11/03/22 0437  Weight: 52.6 kg 54.7 kg    Examination: General: Resting in bed, no acute distress HENT: Normocephalic, atraumatic Lungs: Very coarse breath sounds to bilateral lower lung fields Cardiovascular: Regular rate and rhythm, no murmurs, rubs, gallops Abdomen: Soft, nontender, nondistended Extremities: No edema to bilateral lower extremities Neuro: Alert and oriented x 3, able to follow all instructions   Resolved Hospital Problem list     Assessment & Plan:  This is a 77 year old female with past medical history of  A-fib on Xarelto, SSS/PPM, ILD/pulmonary fibrosis/lupus followed by [name], COPD, diastolic CHF, OSA, aortic, mitral, tricuspid valve disease s/p AVR/MV ring/TVR, CKD 3A who presents with bloody stools found to have diverticular hemorrhage and concern for hemorrhagic shock.  Patient admitted to ICU for further evaluation and management.  #Lower GI bleed likely secondary to diverticular bleed #Acute blood loss anemia #Hemorrhagic shock Patient is on Xarelto and presented with hemoglobin 9.6.  Given Kcentra.  Given vitamin K.  Status post 2 packed red blood cells.  Hemoglobin back to 11.6.  Will monitor pressures. -IV PPI twice daily -2 large-bore IVs -Monitor for further bleeding -GI following -Seems to have stopped bleeding at this time -Clear liquid diet -If patient does not have bleeding for the next 24 hours, can start IV heparin and then transition to Xarelto -If patient does continue to bleed further, recommend IR evaluation for coil embolization  #Paroxysmal atrial fibrillation #  Valvular disease with bioprosthetic AVR/MV ring/bioprosthetic TVR Off Xarelto at this time.  Patient does seem to be rate controlled at this time. -Off Xarelto for now -On telemetry  #HFpEF Most recent echo  showing ejection fraction of 60 to 65%.  No acute concern for exacerbation at this time.  Will continue to monitor volume status.  Home medication includes furosemide 40 mg daily, metoprolol tartrate 12.5 mg twice daily. -Given softer blood pressures, holding this for now -Once patient has improved blood pressures can resume  #CKD stage IIIa Creatinine seems to be at baseline.  No acute concerns at this time. -Continue monitor kidney function -Monitor urine output  #History of lupus Currently on prednisone 5 mg daily at home and hydroxychloroquine 200 mg daily. -Resume home prednisone and hydroxychloroquine  #History of seizures Patient is on Keppra 500 mg twice daily at home.  No acute concerns at this time for seizures. -Continue home Keppra 500 mg twice daily  Best Practice (right click and "Reselect all SmartList Selections" daily)   Diet/type: clear liquids DVT prophylaxis: SCD GI prophylaxis: PPI Lines: N/A Foley:  N/A Code Status:  full code  Labs   CBC: Recent Labs  Lab 11/02/22 1523 11/03/22 0031 11/03/22 0222  WBC 11.1*  --  10.2  NEUTROABS 7.7  --   --   HGB 9.6* 11.6* 11.4*  HCT 30.1* 33.6* 33.1*  MCV 102.4*  --  91.7  PLT 186  --  141*    Basic Metabolic Panel: Recent Labs  Lab 11/02/22 1523 11/03/22 0222  NA 137 140  K 3.5 4.2  CL 103 102  CO2 26 23  GLUCOSE 142* 102*  BUN 26* 31*  CREATININE 1.33* 1.20*  CALCIUM 8.6* 8.7*   GFR: Estimated Creatinine Clearance: 33.5 mL/min (A) (by C-G formula based on SCr of 1.2 mg/dL (H)). Recent Labs  Lab 11/02/22 1523 11/02/22 1644 11/03/22 0222  WBC 11.1*  --  10.2  LATICACIDVEN  --  2.0*  --     Liver Function Tests: Recent Labs  Lab 11/02/22 1523  AST 26  ALT 20  ALKPHOS 71  BILITOT 0.6  PROT 6.5  ALBUMIN 2.7*   No results for input(s): "LIPASE", "AMYLASE" in the last 168 hours. No results for input(s): "AMMONIA" in the last 168 hours.  ABG    Component Value Date/Time   HCO3 26.2  11/27/2020 1258   TCO2 27 10/26/2022 1542   O2SAT 82.0 11/27/2020 1258     Coagulation Profile: No results for input(s): "INR", "PROTIME" in the last 168 hours.  Cardiac Enzymes: No results for input(s): "CKTOTAL", "CKMB", "CKMBINDEX", "TROPONINI" in the last 168 hours.  HbA1C: Hgb A1c MFr Bld  Date/Time Value Ref Range Status  07/02/2022 09:40 AM 5.2 4.8 - 5.6 % Final    Comment:    (NOTE) Pre diabetes:          5.7%-6.4%  Diabetes:              >6.4%  Glycemic control for   <7.0% adults with diabetes   07/03/2020 01:50 PM 5.4 4.6 - 6.5 % Final    Comment:    Glycemic Control Guidelines for People with Diabetes:Non Diabetic:  <6%Goal of Therapy: <7%Additional Action Suggested:  >8%     CBG: Recent Labs  Lab 11/02/22 2037 11/02/22 2222 11/03/22 0335 11/03/22 0835 11/03/22 1128  GLUCAP 97 96 75 80 87    Review of Systems:   Negative Except what is stated in HPI  Past Medical History:  She,  has a past medical history of Achalasia, Acquired hypothyroidism (08/20/2020), Acute metabolic encephalopathy (07/25/2021), AICD (automatic cardioverter/defibrillator) present (11/14/2019), Allergic rhinitis, Ankle fracture (03/25/2022), Aortic valve disorder (03/26/2002), Atherosclerosis of abdominal aorta (HCC) (05/01/2020), Cardiomyopathy (HCC), CHB (complete heart block) (HCC) (08/18/2017), CHF (congestive heart failure) (HCC), Cholelithiasis without obstruction (05/01/2020), Chronic gouty arthritis, Chronic heart failure with preserved ejection fraction (HCC) (05/01/2020), Chronic kidney disease (CKD), active medical management without dialysis, stage 3 (moderate) (HCC) (05/01/2020), Closed torus fracture of distal end of right radius with delayed healing (08/12/2021), Delusional thoughts (HCC), Diverticulosis of colon (05/01/2020), Epistaxis, Essential (primary) hypertension (08/18/2017), Gastroesophageal reflux disease (05/01/2020), Gastrointestinal hemorrhage, 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 (11/14/2019), Hypercoagulability due to atrial fibrillation (HCC) (05/01/2020), Hyperlipidemia (11/14/2019), Hypocalcemia (12/14/2020), Hypoglycemia, Hypokalemia (12/14/2020), Hypomagnesemia (12/14/2020), Hypotension (12/14/2020), Idiopathic pulmonary fibrosis (HCC) (05/01/2020), 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05/01/2020), Iron deficiency anemia, Long term (current) use of anticoagulants, Malnutrition of mild degree (Gomez: 75% to less than 90% of standard weight) (HCC), Mild dementia (HCC) (08/12/2021), Mitral and aortic incompetence (11/14/2019), Non-rheumatic atrial fibrillation (HCC) (05/01/2020), Non-toxic multinodular goiter (08/20/2020), NSVT (nonsustained ventricular tachycardia) (HCC) (08/18/2017), Oropharyngeal dysphagia, Osteoarthritis of hip (05/01/2020), Osteoarthritis of knee (05/01/2020), Osteopenia of neck of left femur, Pain of left hip joint (12/12/2017), Proteinuria (05/01/2020), Raynaud's disease, Recurrent falls (04/09/2021), Rheumatoid arthritis (HCC), 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   Surgical History:   Past Surgical History:  Procedure Laterality Date   BREAST BIOPSY Left    CARDIAC VALVE SURGERY     CARPAL TUNNEL RELEASE     COLONOSCOPY WITH PROPOFOL N/A 12/20/2020   Procedure: COLONOSCOPY WITH PROPOFOL;  Surgeon: Willis Modena, MD;  Location: [facility] ENDOSCOPY;  Service: Endoscopy;  Laterality: N/A;   HEMORRHOID SURGERY     PACEMAKER INSERTION     RIGHT/LEFT HEART CATH AND CORONARY ANGIOGRAPHY N/A 11/27/2020   Procedure: RIGHT/LEFT HEART CATH AND CORONARY ANGIOGRAPHY;  Surgeon: Dolores Patty, MD;  Location: [facility] INVASIVE CV LAB;  Service:  Cardiovascular;  Laterality: N/A;   TONSILLECTOMY       Social History:   reports that she has never smoked. She has been exposed to tobacco smoke. She has never used smokeless tobacco. She reports that she does not drink alcohol and does not use drugs.   Family History:  Her family history includes Cancer in her brother; Diabetes in her brother; Heart disease in her father, mother, and sister; Hypertension in her father and mother; Osteoarthritis in her father and mother; Rheum arthritis in her mother.   Allergies Allergies  Allergen Reactions   Other Other (See Comments)    Patient is to NOT EAT any foods with husks or tree nuts, popcorn   Propofol Shortness Of Breath and Other (See Comments)    "Caused asthma"   Flecainide Other (See Comments)    Reaction not recalled   Amiodarone Other (See Comments)    Delirium/Confusion/Psychosis   Amlodipine Other (See Comments)    "Tired and syncope"     Home Medications  Prior to Admission medications   Medication Sig Start Date End Date Taking? Authorizing Provider  acetaminophen (TYLENOL) 500 MG tablet Take 1,000 mg by mouth every 6 (six) hours as needed (pain).   Yes [provider]  albuterol (VENTOLIN HFA) 108 (90 Base) MCG/ACT inhaler Inhale 2 puffs into the lungs  every 6 (six) hours as needed for wheezing or shortness of breath.   Yes [provider]  allopurinol (ZYLOPRIM) 100 MG tablet TAKE 1 TABLET BY MOUTH EVERY DAY 06/01/22  Yes Rice, Jamesetta Orleans, MD  amoxicillin (AMOXIL) 500 MG capsule 4 capsules by mouth 1 hour before dental procedure. 12/30/21  Yes [provider]  Budeson-Glycopyrrol-Formoterol (BREZTRI AEROSPHERE) 160-9-4.8 MCG/ACT AERO Inhale 2 puffs into the lungs 2 (two) times daily as needed ("for flares").   Yes [provider]  calcium-vitamin D (OSCAL WITH D) 500-5 MG-MCG tablet Take 2 tablets by mouth 2 (two) times daily. 07/10/22  Yes Elgergawy, Leana Roe, MD  cetirizine HCl  (ZYRTEC) 5 MG/5ML SOLN Take 1 mL by mouth 2 (two) times daily as needed for rhinitis or allergies.   Yes [provider]  Cholecalciferol (VITAMIN D3) 25 MCG (1000 UT) CAPS Take 1,000 Units by mouth daily with lunch.   Yes [provider]  fluticasone (FLONASE) 50 MCG/ACT nasal spray PLACE 1 SPRAY INTO BOTH NOSTRILS 2 (TWO) TIMES DAILY Patient taking differently: Place 1 spray into both nostrils daily as needed for allergies. 05/11/22  Yes Hunsucker, Lesia Sago, MD  furosemide (LASIX) 40 MG tablet Take 1 tablet (40 mg total) by mouth daily. 07/30/22  Yes Milford, Anderson Malta, FNP  Glycerin-Hypromellose-PEG 400 (DRY EYE RELIEF DROPS OP) Place 1 drop into both eyes daily as needed (for dry eye).   Yes [provider]  hydroxychloroquine (PLAQUENIL) 200 MG tablet TAKE 1 TABLET BY MOUTH EVERY DAY 09/21/22  Yes Rice, Jamesetta Orleans, MD  levETIRAcetam (KEPPRA) 500 MG tablet Take 1 tablet (500 mg total) by mouth 2 (two) times daily. 10/27/22  Yes Pokhrel, Laxman, MD  levothyroxine (SYNTHROID) 25 MCG tablet Take 1 tablet (25 mcg total) by mouth daily. 11/06/21  Yes Shamleffer, Konrad Dolores, MD  magnesium oxide (MAG-OX) 400 (240 Mg) MG tablet TAKE 1 TABLET BY MOUTH EVERY DAY 08/25/22  Yes Lanier Prude, MD  metoprolol tartrate (LOPRESSOR) 25 MG tablet Take 0.5 tablets (12.5 mg total) by mouth 2 (two) times daily. 07/10/22  Yes Elgergawy, Leana Roe, MD  Nintedanib (OFEV) 150 MG CAPS Take 150 mg by mouth at bedtime.   Yes [provider]  OLANZapine (ZYPREXA) 2.5 MG tablet TAKE 1 TABLET BY MOUTH EVERYDAY AT BEDTIME Patient taking differently: Take 2.5 mg by mouth at bedtime. 11/01/22  Yes Jaffe, Adam R, DO  pantoprazole (PROTONIX) 40 MG tablet Take 1 tablet (40 mg total) by mouth 2 (two) times daily. 07/10/22  Yes Elgergawy, Leana Roe, MD  polyethylene glycol powder (MIRALAX) 17 GM/SCOOP powder Take 17 g by mouth daily as needed for mild constipation. 07/03/21  Yes [provider]  potassium chloride (KLOR-CON M10) 10 MEQ tablet TAKE 2 TABLETS BY MOUTH DAILY 06/01/22  Yes Sheilah Pigeon, PA-C  predniSONE (DELTASONE) 5 MG tablet TAKE 1 TABLET (5 MG TOTAL) BY MOUTH DAILY WITH BREAKFAST. PATIENT TO DOUBLE UP DOSE DURING SICK DAY RULE 08/19/22  Yes Shamleffer, Konrad Dolores, MD  XARELTO 15 MG TABS tablet TAKE 1 TABLET (15 MG TOTAL) BY MOUTH DAILY WITH SUPPER Patient taking differently: Take 15 mg by mouth daily with supper. 09/20/22  Yes Sheilah Pigeon, PA-C     Critical care time: 34 mins     Modena Slater, DO Internal Medicine Resident PGY-2 Pager: [phone]

## 2022-11-03 NOTE — Consult Note (Signed)
[facility] Consult  Referring Provider: ER Primary Care Physician:  Collene Mares, [name] Primary Gastroenterologist: Dr. Pres[facility] Raddle gastroenterology  Reason for Consultation: Rectal bleeding  HPI: Lindsey Huber is a 77 y.o. female well-known to me from outpatient basis came to the ER with rectal bleeding. Patient states she was in her usual state of health until around 2 PM yesterday when she had fecal urgency and noticed that she was passing moderate amount of bright red blood with liquid stool. This was not associated with any abdominal or rectal pain. She got up and felt the urge to have another bowel movement which was again bloody. She lives at home with her daughter and granddaughter and decided to call EMS to come to the ER. Since presentation to the ER she had multiple more episodes of dark red bloody stool. Subsequently had a CT abdomen and pelvis angiogram reported at 18: 39 which showed active contrast extravasation from diverticulum in proximal sigmoid consistent with diverticular hemorrhage. Patient was treated with Kcentra, vitamin K and 2 unit PRBC transfusion and transferred to ICU for monitoring. Patient states her last episode of bloody bowel movement was last night, she is passing gas but has not had any further bloody bowel movements.  Patient had a colonoscopyon on 12/20/2020 with Dr. Dulce Sellar for hematochezia and anemia: Diverticulosis in sigmoid and descending with hemorrhoids noted She has history of achalasia based on barium swallow from 06/2020, has more trouble swallowing pills but denies problems swallowing solids or liquids.   Past Medical History:  Diagnosis Date   Achalasia    Acquired hypothyroidism 08/20/2020   Acute metabolic encephalopathy 07/25/2021   AICD (automatic cardioverter/defibrillator) present 11/14/2019   Allergic rhinitis    Ankle fracture 03/25/2022   Aortic valve disorder 03/26/2002   Atherosclerosis of abdominal aorta  (HCC) 05/01/2020   Cardiomyopathy (HCC)    CHB (complete heart block) (HCC) 08/18/2017   CHF (congestive heart failure) (HCC)    Cholelithiasis without obstruction 05/01/2020   Chronic gouty arthritis    Chronic heart failure with preserved ejection fraction (HCC) 05/01/2020   Chronic kidney disease (CKD), active medical management without dialysis, stage 3 (moderate) (HCC) 05/01/2020   Closed torus fracture of distal end of right radius with delayed healing 08/12/2021   Delusional thoughts (HCC)    Diverticulosis of colon 05/01/2020   Epistaxis    Essential (primary) hypertension 08/18/2017   Gastroesophageal reflux disease 05/01/2020   Gastrointestinal hemorrhage    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 11/14/2019   Hypercoagulability due to atrial fibrillation (HCC) 05/01/2020   Hyperlipidemia 11/14/2019   Hypocalcemia 12/14/2020   Hypoglycemia    Hypokalemia 12/14/2020   Hypomagnesemia 12/14/2020   Hypotension 12/14/2020   Idiopathic pulmonary fibrosis (HCC) 05/01/2020   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   Iron deficiency anemia    Long term (current) use of anticoagulants    Malnutrition of mild degree [name]: 75% to less than 90% of standard weight) (HCC)    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/12/2021   Mitral and aortic incompetence 11/14/2019   Non-rheumatic atrial fibrillation (HCC) 05/01/2020   Non-toxic multinodular goiter 08/20/2020   NSVT (nonsustained ventricular tachycardia) (HCC) 08/18/2017   Oropharyngeal dysphagia    Osteoarthritis of hip 05/01/2020   Osteoarthritis of knee 05/01/2020   Osteopenia of neck of left femur    Pain of left hip joint 12/12/2017   Proteinuria 05/01/2020   Raynaud's disease    Recurrent falls 04/09/2021   Rheumatoid arthritis (HCC)    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   Septic shock (HCC) 03/10/2020   Slow transit constipation    Systemic lupus  erythematosus (HCC) 05/01/2020   Thrombophilia (HCC)    Transient ischemic attack    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   Vitamin D deficiency     Past Surgical History:  Procedure Laterality Date   BREAST BIOPSY Left    CARDIAC VALVE SURGERY     CARPAL TUNNEL RELEASE     COLONOSCOPY WITH PROPOFOL N/A 12/20/2020   Procedure: COLONOSCOPY WITH PROPOFOL;  Surgeon: Willis Modena, MD;  Location: [facility] ENDOSCOPY;  Service: Endoscopy;  Laterality: N/A;   HEMORRHOID SURGERY     PACEMAKER INSERTION     RIGHT/LEFT HEART CATH AND CORONARY ANGIOGRAPHY N/A 11/27/2020   Procedure: RIGHT/LEFT HEART CATH AND CORONARY ANGIOGRAPHY;  Surgeon: Dolores Patty, MD;  Location: [facility] INVASIVE CV LAB;  Service: Cardiovascular;  Laterality: N/A;   TONSILLECTOMY      Prior to Admission medications   Medication Sig Start Date End Date Taking? Authorizing Provider  acetaminophen (TYLENOL) 500 MG tablet Take 1,000 mg by mouth every 6 (six) hours as needed (pain).   Yes [provider]  albuterol (VENTOLIN HFA) 108 (90 Base) MCG/ACT inhaler Inhale 2 puffs into the lungs every 6 (six) hours as needed for wheezing or shortness of breath.   Yes [provider]  allopurinol (ZYLOPRIM) 100 MG tablet TAKE 1 TABLET BY MOUTH EVERY DAY 06/01/22  Yes Rice, Jamesetta Orleans, MD  amoxicillin (AMOXIL) 500 MG capsule 4 capsules by mouth 1 hour before dental procedure. 12/30/21  Yes [provider]  Budeson-Glycopyrrol-Formoterol (BREZTRI AEROSPHERE) 160-9-4.8 MCG/ACT AERO Inhale 2 puffs into the lungs 2 (two) times daily as needed ("for flares").   Yes [provider]  calcium-vitamin D (OSCAL WITH D) 500-5 MG-MCG tablet Take 2 tablets by mouth 2 (two) times daily. 07/10/22  Yes Elgergawy, Leana Roe, MD  cetirizine HCl (ZYRTEC) 5 MG/5ML SOLN Take 1 mL by mouth 2 (two) times daily as needed for rhinitis or allergies.   Yes [provider]  Cholecalciferol (VITAMIN D3) 25 MCG (1000 UT) CAPS Take  1,000 Units by mouth daily with lunch.   Yes [provider]  fluticasone (FLONASE) 50 MCG/ACT nasal spray PLACE 1 SPRAY INTO BOTH NOSTRILS 2 (TWO) TIMES DAILY Patient taking differently: Place 1 spray into both nostrils daily as needed for allergies. 05/11/22  Yes Hunsucker, Lesia Sago, MD  furosemide (LASIX) 40 MG tablet Take 1 tablet (40 mg total) by mouth daily. 07/30/22  Yes Milford, Anderson Malta, FNP  Glycerin-Hypromellose-PEG 400 (DRY EYE RELIEF DROPS OP) Place 1 drop into both eyes daily as needed (for dry eye).   Yes [provider]  hydroxychloroquine (PLAQUENIL) 200 MG tablet TAKE 1 TABLET BY MOUTH EVERY DAY 09/21/22  Yes Rice, Jamesetta Orleans, MD  levETIRAcetam (KEPPRA) 500 MG tablet Take 1 tablet (500 mg total) by mouth 2 (two) times daily. 10/27/22  Yes Pokhrel, Laxman, MD  levothyroxine (SYNTHROID) 25 MCG tablet Take 1 tablet (25 mcg total) by mouth daily. 11/06/21  Yes Shamleffer, Konrad Dolores, MD  magnesium oxide (MAG-OX) 400 (240 Mg) MG tablet TAKE 1 TABLET BY MOUTH EVERY DAY 08/25/22  Yes Lanier Prude, MD  metoprolol tartrate (LOPRESSOR) 25 MG tablet Take 0.5 tablets (12.5 mg total) by mouth 2 (two) times daily. 07/10/22  Yes Elgergawy, Leana Roe, MD  Nintedanib (OFEV) 150 MG CAPS Take 150 mg by mouth at bedtime.  Yes [provider]  OLANZapine (ZYPREXA) 2.5 MG tablet TAKE 1 TABLET BY MOUTH EVERYDAY AT BEDTIME Patient taking differently: Take 2.5 mg by mouth at bedtime. 11/01/22  Yes Jaffe, Adam R, DO  pantoprazole (PROTONIX) 40 MG tablet Take 1 tablet (40 mg total) by mouth 2 (two) times daily. 07/10/22  Yes Elgergawy, Leana Roe, MD  polyethylene glycol powder (MIRALAX) 17 GM/SCOOP powder Take 17 g by mouth daily as needed for mild constipation. 07/03/21  Yes [provider]  potassium chloride (KLOR-CON M10) 10 MEQ tablet TAKE 2 TABLETS BY MOUTH DAILY 06/01/22  Yes Sheilah Pigeon, PA-C  predniSONE (DELTASONE) 5 MG tablet TAKE 1 TABLET (5 MG TOTAL) BY  MOUTH DAILY WITH BREAKFAST. PATIENT TO DOUBLE UP DOSE DURING SICK DAY RULE 08/19/22  Yes Shamleffer, Konrad Dolores, MD  XARELTO 15 MG TABS tablet TAKE 1 TABLET (15 MG TOTAL) BY MOUTH DAILY WITH SUPPER Patient taking differently: Take 15 mg by mouth daily with supper. 09/20/22  Yes Sheilah Pigeon, PA-C    Current Facility-Administered Medications  Medication Dose Route Frequency Provider Last Rate Last Admin   0.9 %  sodium chloride infusion (Manually program via Guardrails IV Fluids)   Intravenous Once Gerhard Munch, MD   Stopped at 11/02/22 2014   Chlorhexidine Gluconate Cloth 2 % PADS 6 each  6 each Topical Daily Oretha Milch, MD       docusate sodium (COLACE) capsule 100 mg  100 mg Oral BID PRN Lidia Collum, PA-C       fluticasone furoate-vilanterol (BREO ELLIPTA) 200-25 MCG/ACT 1 puff  1 puff Inhalation Daily Francena Hanly, RPH   1 puff at 11/03/22 3557   And   umeclidinium bromide (INCRUSE ELLIPTA) 62.5 MCG/ACT 1 puff  1 puff Inhalation Daily Francena Hanly, RPH   1 puff at 11/03/22 3220   influenza vaccine adjuvanted (FLUAD) injection 0.5 mL  0.5 mL Intramuscular Tomorrow-1000 Oretha Milch, MD       iohexol (OMNIPAQUE) 350 MG/ML injection 60 mL  60 mL Intravenous Once PRN Gerhard Munch, MD       ipratropium-albuterol (DUONEB) 0.5-2.5 (3) MG/3ML nebulizer solution 3 mL  3 mL Nebulization Q4H PRN Lidia Collum, PA-C       levETIRAcetam (KEPPRA) IVPB 500 mg/100 mL premix  500 mg Intravenous Q12H Pia Mau D, PA-C   Stopping previously hung infusion at 11/03/22 0700   ondansetron (ZOFRAN) injection 4 mg  4 mg Intravenous Q6H PRN Paliwal, Aditya, MD   4 mg at 11/02/22 2226   Oral care mouth rinse  15 mL Mouth Rinse PRN Oretha Milch, MD       pantoprazole (PROTONIX) injection 40 mg  40 mg Intravenous Q12H Pia Mau D, PA-C       polyethylene glycol (MIRALAX / GLYCOLAX) packet 17 g  17 g Oral Daily PRN Pia Mau D, PA-C        Allergies as of 11/02/2022 - Review  Complete 11/02/2022  Allergen Reaction Noted   Other Other (See Comments) 12/26/2021   Propofol Shortness Of Breath and Other (See Comments) 02/06/2007   Flecainide Other (See Comments) 06/19/2020   Amiodarone Other (See Comments) 09/03/2015   Amlodipine Other (See Comments) 09/01/2016    Family History  Problem Relation Age of Onset   Heart disease Mother    Hypertension Mother    Rheum arthritis Mother    Osteoarthritis Mother    Heart disease Father    Hypertension Father  Osteoarthritis Father    Heart disease Sister    Diabetes Brother    Cancer Brother     Social History   Socioeconomic History   Marital status: Legally Separated    Spouse name: Not on file   Number of children: Not on file   Years of education: 16   Highest education level: Bachelor's degree (e.g., BA, AB, BS)  Occupational History   Occupation: Retired    Comment: Community education officer business  Tobacco Use   Smoking status: Never    Passive exposure: Past   Smokeless tobacco: Never  Vaping Use   Vaping status: Never Used  Substance and Sexual Activity   Alcohol use: Never   Drug use: Never   Sexual activity: Not Currently  Other Topics Concern   Not on file  Social History Narrative   Right Handed    Social Determinants of Health   Financial Resource Strain: Not on file  Food Insecurity: No Food Insecurity (11/02/2022)   Hunger Vital Sign    Worried About Running Out of Food in the Last Year: Never true    Ran Out of Food in the Last Year: Never true  Transportation Needs: No Transportation Needs (11/02/2022)   PRAPARE - Administrator, Civil Service (Medical): No    Lack of Transportation (Non-Medical): No  Physical Activity: Not on file  Stress: Not on file  Social Connections: Not on file  Intimate Partner Violence: Not At Risk (11/02/2022)   Humiliation, Afraid, Rape, and Kick questionnaire    Fear of Current or Ex-Partner: No    Emotionally Abused: No    Physically Abused: No     Sexually Abused: No    Review of Systems: As per HPI.  Physical Exam: Vital signs in last 24 hours: Temp:  [97.4 F (36.3 C)-98.5 F (36.9 C)] 98.5 F (36.9 C) (10/09 0830) Pulse Rate:  [62-71] 71 (10/09 0800) Resp:  [14-28] 22 (10/09 0800) BP: (84-135)/(43-90) 126/68 (10/09 0800) SpO2:  [96 %-100 %] 98 % (10/09 0800) Weight:  [52.6 kg-54.7 kg] 54.7 kg (10/09 0437) Last BM Date : 11/02/22  General: Elderly, ill-appearing  Head:  Normocephalic and atraumatic. Eyes:  Sclera clear, no icterus.   Conjunctiva pink. Ears:  Normal auditory acuity. Nose:  No deformity, discharge,  or lesions. Mouth:  No deformity or lesions.  Oropharynx pink & moist. Neck:  Supple; no masses or thyromegaly. Lungs:  Clear throughout to auscultation.   No wheezes, crackles, or rhonchi. No acute distress. Heart: Pacemaker over left chest, regular rate and rhythm Extremities:  Without clubbing or edema. Neurologic:  Alert and  oriented x4;  grossly normal neurologically. Skin:  Intact without significant lesions or rashes. Psych:  Alert and cooperative. Normal mood and affect. Abdomen:  Soft, nontender and nondistended. No masses, hepatosplenomegaly or hernias noted. Normal bowel sounds, without guarding, and without rebound.         Lab Results: Recent Labs    11/02/22 1523 11/03/22 0031 11/03/22 0222  WBC 11.1*  --  10.2  HGB 9.6* 11.6* 11.4*  HCT 30.1* 33.6* 33.1*  PLT 186  --  141*   BMET Recent Labs    11/02/22 1523 11/03/22 0222  NA 137 140  K 3.5 4.2  CL 103 102  CO2 26 23  GLUCOSE 142* 102*  BUN 26* 31*  CREATININE 1.33* 1.20*  CALCIUM 8.6* 8.7*   LFT Recent Labs    11/02/22 1523  PROT 6.5  ALBUMIN 2.7*  AST 26  ALT 20  ALKPHOS 71  BILITOT 0.6   PT/INR No results for input(s): "LABPROT", "INR" in the last 72 hours.  Studies/Results: CT Angio Abd/Pel W and/or Wo Contrast  Result Date: 11/02/2022 CLINICAL DATA:  Rectal bleeding EXAM: CTA ABDOMEN AND PELVIS  WITHOUT AND WITH CONTRAST TECHNIQUE: Multidetector CT imaging of the abdomen and pelvis was performed using the standard protocol during bolus administration of intravenous contrast. Multiplanar reconstructed images and MIPs were obtained and reviewed to evaluate the vascular anatomy. RADIATION DOSE REDUCTION: This exam was performed according to the departmental dose-optimization program which includes automated exposure control, adjustment of the mA and/or kV according to patient size and/or use of iterative reconstruction technique. CONTRAST:  75mL OMNIPAQUE IOHEXOL 350 MG/ML SOLN COMPARISON:  01/31/2020 FINDINGS: VASCULAR Normal contour and caliber of the abdominal aorta. No evidence of aneurysm, dissection, or other acute aortic pathology. 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 Moderate mixed calcific atherosclerosis. Review of the MIP images confirms the above findings. NON-VASCULAR Lower Chest: No acute findings. Gross cardiomegaly. Coronary artery calcifications Hepatobiliary: No solid liver abnormality is seen. Small gallstones. No gallbladder wall thickening, or biliary dilatation. Pancreas: Unremarkable. No pancreatic ductal dilatation or surrounding inflammatory changes. Spleen: Normal in size without significant abnormality. Adrenals/Urinary Tract: Adrenal glands are unremarkable. Kidneys are normal, without renal calculi, solid lesion, or hydronephrosis. Bladder is unremarkable. Stomach/Bowel: Stomach is within normal limits. Normal appendix. Descending and sigmoid diverticulosis. Active contrast extravasation from a diverticulum in the proximal sigmoid (series 17, image 145). Lymphatic: No enlarged abdominal or pelvic lymph nodes. Reproductive: Calcified uterine fibroids. Other: No abdominal wall hernia or abnormality. No ascites. Musculoskeletal: No acute osseous findings. IMPRESSION: 1. Active contrast  extravasation from a diverticulum in the proximal sigmoid, consistent with diverticular hemorrhage. 2. Descending and sigmoid diverticulosis. 3. Cholelithiasis. 4. Calcified uterine fibroids. Call report request was placed at the time of interpretation. Report issued at this time in the interest of expediency. Final communication of critical findings will be documented. Aortic Atherosclerosis (ICD10-I70.0). Electronically Signed   By: Jearld Lesch M.D.   On: 11/02/2022 18:39    Impression: Sigmoid diverticular bleed with active extravasation noted on CTA Managed conservatively with Kcentra and vitamin K and 2 unit PRBC transfusion Last dose of Xarelto was yesterday morning as per patient Hemoglobin 9.6 on presentation, stable now at 11.6/11.4 Hemodynamically stable  Multiple comorbidities: Interstitial pulmonary fibrosis, atrial fibrillation/flutter, history of ventricular tachycardia and torsades, history of TIA, bioprosthetic aortic valve replacement, mitral valve ring, tricuspid valve replacement in 2012, chronic kidney disease, SLE, hypothyroidism, chronic CHF  Plan: I will start patient on clear liquid diet.  If there is evidence of further rectal bleeding, recommend IR evaluation for coil embolization of sigmoid diverticular area where active extravasation was noted on CT angio.  If there is no further bleeding in the next 24 hours, patient will likely need to be started on IV heparin for history of atrial fibrillation.  Hopefully, Xarelto can be resumed in the next 3 days.   LOS: 1 day   Kerin Salen, MD  11/03/2022, 8:34 AM

## 2022-11-03 NOTE — Progress Notes (Deleted)
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 Hospital notes reviewed.  She is accompanied by her daughter who supplements history.  ***   UPDATE: Current medications:  Keppra ***, Lopressor, Lasix, olanzapine 2.5mg  at bedtime  On 10/1, she was at the cardiology clinic when she suddenly developed altered mental status.  She started staring off and speaking gibberish with slurred speech.  Lasted about 20 minutes followed by confusion.  Admitted to [facility].  CT head showed moderate-severe chronic small vessel ischemic changes but no acute findings.  She underwent EEG monitoring which revealed epileptogenicity arising from the left anterior temporal region.  She was loaded and started on Keppra with return to baseline.      DaT scan was ordered but nobody reached out to schedule it.    HISTORY:  On 07/01/2020, the patient experienced a mild headache off and on during the day.  She also had a left temporal headache and noted intermittent numbness and tingling in the bilateral lower extremities.  That evening, she was talking with family when she suddenly started holding the right side of her face, eyes became big and she started drooling.  When her family spoke to her, she could only moan.  She was unable to repeat.  She could not smile but did not exhibit facial droop or unilateral weakness.  After 5-6 minutes, she started speaking but it was slurred and didn't make sense.  She was unable to repeat phrases.  She has no recollection of this.  No convulsions or incontinence  She was brought to the ED.  CTA of head and neck  personally reviewed was negative for large vessel occlusion or hemodynamically significant stenosis.  Unable to have an MRI due to pacemaker.  She has a fib which is treated with Xarelto.  Routine awake and asleep EEG on 07/14/2020 was normal.   On the morning of 03/14/2021, she was noted by her daughter to be acting strange.  When she spoke, she had trouble putting words together and did not make sense.  After about an hour, she became unresponsive, started staring off, making moaning sounds and exhibited head twitching and shaking of both hands.  Lasted 2-3 minutes.  No incontinence or tongue biting.  She continued having some slurred speech and difficulty getting words out afterwards.  In hindsight, she says that she does remember feeling strange during this event and struggling to talk.  No lateralizing symptoms such as facial droop or unilateral limb weakness.  EMS was called and symptoms resolved en route to the ED.  2 days prior, she had a fall and landed on her left arm.  CT head personally reviewed revealed no acute intracranial abnormality.  MRI was unable to be performed because there were no [employer] reps that day who could come to the hospital to turn off the pacemaker.  She was found to have cellulitis on her left medial forearm where she had fallen.  Labs revealed no leukocytes and electrolytes normal.  She was discharged on Keflex.    On 09/29/2022, she was sitting at the table when she suddenly started drooling with slight right  sided facial droop and slurred speech.  No extremity weakness.  Symptoms lasted 15 minutes.  She was seen in the ED.  CT head revealed advanced chronic small vessel ischemic changes but no acute stroke.  CTA head and neck revealed tortuosity of the intracranial and extracranial arteries and mild to moderate atheromatous changes within the carotid bifurcations but no LVO or hemodynamically significant stenosis.  As she was already on maximal medical management and back to  baseline, she was discharged.   Beginning in 2022, her daughter has been concerned about dementia.  She has been having both visual and auditory hallucinations.  For example, she saw a large spider web in her house that she said was put up by somebody.   She sometimes hears people outside the window.  She also may exhibit paranoid delusions such as thinking her cousin is calling into radio stations and talking about her, or that her conversations are being broadcast throughout the apartment complex.  This isn't a frequent occurrence.  She sometimes has tremors of the hands.  She lives with her granddaughter and her daughter stops by everyday.  She is able to perform ADLs independently.  No family history of dementia.  B12 from November 2022 was 1,406.  TSH from January 2023 was 1.470.  24 hour ambulatory EEG on 4/5-04/30/2021 was normal.  No events captured.  Unable to get MRI of brain because she has reported abandoned leads.  CT head on 06/18/2021 and  07/14/2021 showed moderate chronic small vessel ischemic changes in the cerebral white matter but no acute findings.  Neuropsychological evaluation on 08/12/2021 was consistent with major neurocognitive disorder possibly indicating early stages of Lewy body dementia or vascular dementia but not suggestive of Alzheimer's, FTD or Parkinson's/atypical parkinson's etiology.  Past medications:  Eliquis (bleeding)  PAST MEDICAL HISTORY: Past Medical History:  Diagnosis Date   Achalasia    Acquired hypothyroidism 08/20/2020   Acute metabolic encephalopathy 07/25/2021   AICD (automatic cardioverter/defibrillator) present 11/14/2019   Allergic rhinitis    Ankle fracture 03/25/2022   Aortic valve disorder 03/26/2002   Atherosclerosis of abdominal aorta (HCC) 05/01/2020   Cardiomyopathy (HCC)    CHB (complete heart block) (HCC) 08/18/2017   CHF (congestive heart failure) (HCC)    Cholelithiasis without obstruction 05/01/2020   Chronic gouty arthritis     Chronic heart failure with preserved ejection fraction (HCC) 05/01/2020   Chronic kidney disease (CKD), active medical management without dialysis, stage 3 (moderate) (HCC) 05/01/2020   Closed torus fracture of distal end of right radius with delayed healing 08/12/2021   Delusional thoughts (HCC)    Diverticulosis of colon 05/01/2020   Epistaxis    Essential (primary) hypertension 08/18/2017   Gastroesophageal reflux disease 05/01/2020   Gastrointestinal hemorrhage    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 11/14/2019   Hypercoagulability due to atrial fibrillation (HCC) 05/01/2020   Hyperlipidemia 11/14/2019   Hypocalcemia 12/14/2020   Hypoglycemia    Hypokalemia 12/14/2020   Hypomagnesemia 12/14/2020   Hypotension 12/14/2020   Idiopathic pulmonary fibrosis (HCC) 05/01/2020   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   Iron deficiency anemia    Long term (current) use of anticoagulants    Malnutrition of mild degree [name]: 75% to less than 90% of standard weight) (HCC)    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/12/2021   Mitral and aortic incompetence 11/14/2019   Non-rheumatic atrial fibrillation (HCC) 05/01/2020   Non-toxic multinodular goiter 08/20/2020   NSVT (nonsustained ventricular tachycardia) (HCC) 08/18/2017   Oropharyngeal dysphagia  Osteoarthritis of hip 05/01/2020   Osteoarthritis of knee 05/01/2020   Osteopenia of neck of left femur    Pain of left hip joint 12/12/2017   Proteinuria 05/01/2020   Raynaud's disease    Recurrent falls 04/09/2021   Rheumatoid arthritis (HCC)    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   Septic shock (HCC) 03/10/2020   Slow transit constipation    Systemic lupus erythematosus (HCC) 05/01/2020   Thrombophilia (HCC)    Transient ischemic attack    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   Vitamin D deficiency     MEDICATIONS: Current Facility-Administered Medications on File  Prior to Visit  Medication Dose Route Frequency Provider Last Rate Last Admin   0.9 %  sodium chloride infusion (Manually program via Guardrails IV Fluids)   Intravenous Once Gerhard Munch, MD   Stopped at 11/02/22 2014   Chlorhexidine Gluconate Cloth 2 % PADS 6 each  6 each Topical Daily Oretha Milch, MD       docusate sodium (COLACE) capsule 100 mg  100 mg Oral BID PRN Lidia Collum, PA-C       fluticasone furoate-vilanterol (BREO ELLIPTA) 200-25 MCG/ACT 1 puff  1 puff Inhalation Daily Estill Batten W, RPH       And   umeclidinium bromide (INCRUSE ELLIPTA) 62.5 MCG/ACT 1 puff  1 puff Inhalation Daily Estill Batten W, RPH       influenza vaccine adjuvanted (FLUAD) injection 0.5 mL  0.5 mL Intramuscular Tomorrow-1000 Oretha Milch, MD       iohexol (OMNIPAQUE) 350 MG/ML injection 60 mL  60 mL Intravenous Once PRN Gerhard Munch, MD       ipratropium-albuterol (DUONEB) 0.5-2.5 (3) MG/3ML nebulizer solution 3 mL  3 mL Nebulization Q4H PRN Lidia Collum, PA-C       levETIRAcetam (KEPPRA) IVPB 500 mg/100 mL premix  500 mg Intravenous Q12H Pia Mau D, PA-C 400 mL/hr at 11/02/22 2058 500 mg at 11/02/22 2058   ondansetron (ZOFRAN) injection 4 mg  4 mg Intravenous Q6H PRN Conrad , MD   4 mg at 11/02/22 2226   Oral care mouth rinse  15 mL Mouth Rinse PRN Oretha Milch, MD       pantoprazole (PROTONIX) injection 40 mg  40 mg Intravenous Q12H Pia Mau D, PA-C       polyethylene glycol (MIRALAX / GLYCOLAX) packet 17 g  17 g Oral Daily PRN Lidia Collum, PA-C       Current Outpatient Medications on File Prior to Visit  Medication Sig Dispense Refill   acetaminophen (TYLENOL) 500 MG tablet Take 1,000 mg by mouth every 6 (six) hours as needed (pain).     albuterol (VENTOLIN HFA) 108 (90 Base) MCG/ACT inhaler Inhale 2 puffs into the lungs every 6 (six) hours as needed for wheezing or shortness of breath.     allopurinol (ZYLOPRIM) 100 MG tablet TAKE 1 TABLET BY MOUTH EVERY DAY 90  tablet 1   amoxicillin (AMOXIL) 500 MG capsule 4 capsules by mouth 1 hour before dental procedure.     Budeson-Glycopyrrol-Formoterol (BREZTRI AEROSPHERE) 160-9-4.8 MCG/ACT AERO Inhale 2 puffs into the lungs 2 (two) times daily as needed ("for flares").     calcium-vitamin D (OSCAL WITH D) 500-5 MG-MCG tablet Take 2 tablets by mouth 2 (two) times daily. 120 tablet 0   cetirizine HCl (ZYRTEC) 5 MG/5ML SOLN Take 1 mL by mouth 2 (two) times daily as needed for rhinitis or allergies.  Cholecalciferol (VITAMIN D3) 25 MCG (1000 UT) CAPS Take 1,000 Units by mouth daily with lunch.     fluticasone (FLONASE) 50 MCG/ACT nasal spray PLACE 1 SPRAY INTO BOTH NOSTRILS 2 (TWO) TIMES DAILY (Patient taking differently: Place 1 spray into both nostrils daily as needed for allergies.) 48 mL 2   furosemide (LASIX) 40 MG tablet Take 1 tablet (40 mg total) by mouth daily. 180 tablet 3   Glycerin-Hypromellose-PEG 400 (DRY EYE RELIEF DROPS OP) Place 1 drop into both eyes daily as needed (for dry eye).     hydroxychloroquine (PLAQUENIL) 200 MG tablet TAKE 1 TABLET BY MOUTH EVERY DAY 90 tablet 0   levETIRAcetam (KEPPRA) 500 MG tablet Take 1 tablet (500 mg total) by mouth 2 (two) times daily. 60 tablet 2   levothyroxine (SYNTHROID) 25 MCG tablet Take 1 tablet (25 mcg total) by mouth daily. 90 tablet 3   magnesium oxide (MAG-OX) 400 (240 Mg) MG tablet TAKE 1 TABLET BY MOUTH EVERY DAY 90 tablet 3   metoprolol tartrate (LOPRESSOR) 25 MG tablet Take 0.5 tablets (12.5 mg total) by mouth 2 (two) times daily. 30 tablet 0   Nintedanib (OFEV) 150 MG CAPS Take 150 mg by mouth at bedtime.     OLANZapine (ZYPREXA) 2.5 MG tablet TAKE 1 TABLET BY MOUTH EVERYDAY AT BEDTIME (Patient taking differently: Take 2.5 mg by mouth at bedtime.) 90 tablet 0   pantoprazole (PROTONIX) 40 MG tablet Take 1 tablet (40 mg total) by mouth 2 (two) times daily. 60 tablet 0   polyethylene glycol powder (MIRALAX) 17 GM/SCOOP powder Take 17 g by mouth daily as  needed for mild constipation.     potassium chloride (KLOR-CON M10) 10 MEQ tablet TAKE 2 TABLETS BY MOUTH DAILY 180 tablet 2   predniSONE (DELTASONE) 5 MG tablet TAKE 1 TABLET (5 MG TOTAL) BY MOUTH DAILY WITH BREAKFAST. PATIENT TO DOUBLE UP DOSE DURING SICK DAY RULE 100 tablet 3   XARELTO 15 MG TABS tablet TAKE 1 TABLET (15 MG TOTAL) BY MOUTH DAILY WITH SUPPER (Patient taking differently: Take 15 mg by mouth daily with supper.) 90 tablet 1    ALLERGIES: Allergies  Allergen Reactions   Other Other (See Comments)    Patient is to NOT EAT any foods with husks or tree nuts, popcorn   Propofol Shortness Of Breath and Other (See Comments)    "Caused asthma"   Flecainide Other (See Comments)    Reaction not recalled   Amiodarone Other (See Comments)    Delirium/Confusion/Psychosis   Amlodipine Other (See Comments)    "Tired and syncope"    FAMILY HISTORY: Family History  Problem Relation Age of Onset   Heart disease Mother    Hypertension Mother    Rheum arthritis Mother    Osteoarthritis Mother    Heart disease Father    Hypertension Father    Osteoarthritis Father    Heart disease Sister    Diabetes Brother    Cancer Brother       Objective:  *** General: No acute distress.  Patient appears well-groomed.   Head:  Normocephalic/atraumatic Eyes:  Fundi examined but not visualized Neck: supple, no paraspinal tenderness, full range of motion Heart:  Regular rate and rhythm Neurological Exam: ***   Shon Millet, DO  CC: Evangeline Dakin, [name]

## 2022-11-04 ENCOUNTER — Ambulatory Visit: Payer: Medicare HMO | Admitting: Neurology

## 2022-11-04 ENCOUNTER — Ambulatory Visit: Payer: Medicare HMO | Admitting: Pulmonary Disease

## 2022-11-04 DIAGNOSIS — K922 Gastrointestinal hemorrhage, unspecified: Secondary | ICD-10-CM | POA: Diagnosis not present

## 2022-11-04 LAB — BASIC METABOLIC PANEL
Anion gap: 11 (ref 5–15)
BUN: 38 mg/dL — ABNORMAL HIGH (ref 8–23)
CO2: 25 mmol/L (ref 22–32)
Calcium: 8.1 mg/dL — ABNORMAL LOW (ref 8.9–10.3)
Chloride: 95 mmol/L — ABNORMAL LOW (ref 98–111)
Creatinine, Ser: 2.12 mg/dL — ABNORMAL HIGH (ref 0.44–1.00)
GFR, Estimated: 24 mL/min — ABNORMAL LOW (ref >60)
Glucose, Bld: 114 mg/dL — ABNORMAL HIGH (ref 70–99)
Potassium: 3.7 mmol/L (ref 3.5–5.1)
Sodium: 131 mmol/L — ABNORMAL LOW (ref 135–145)

## 2022-11-04 LAB — APTT: aPTT: 33 s (ref 24–36)

## 2022-11-04 LAB — HEMOGLOBIN AND HEMATOCRIT, BLOOD
HCT: 27.3 % — ABNORMAL LOW (ref 36.0–46.0)
Hemoglobin: 9.5 g/dL — ABNORMAL LOW (ref 12.0–15.0)

## 2022-11-04 LAB — GLUCOSE, CAPILLARY
Glucose-Capillary: 112 mg/dL — ABNORMAL HIGH (ref 70–99)
Glucose-Capillary: 109 mg/dL — ABNORMAL HIGH (ref 70–99)

## 2022-11-04 LAB — HEPARIN LEVEL (UNFRACTIONATED): Heparin Unfractionated: 0.37 [IU]/mL (ref 0.30–0.70)

## 2022-11-04 IMAGING — US US EXTREM LOW VENOUS*L*
1 series · 14 of 24 positions shown · non-contrast
Comparison: None.

CLINICAL DATA: Left lower extremity swelling left femoral

EXAM:
LEFT LOWER EXTREMITY VENOUS DOPPLER ULTRASOUND
TECHNIQUE: Gray-scale sonography with compression, as well as color and duplex
ultrasound, were performed to evaluate the deep venous system(s)
from the level of the common femoral vein through the popliteal and
proximal calf veins.

[Series 1: us extrem low venous*left* · 0.07mm/px · 14 of 37 slices shown]
[im 1/37]
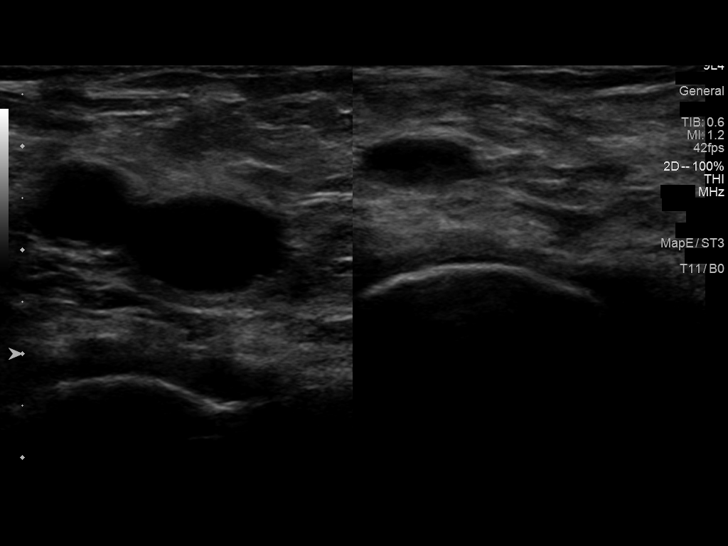
[im 4/37]
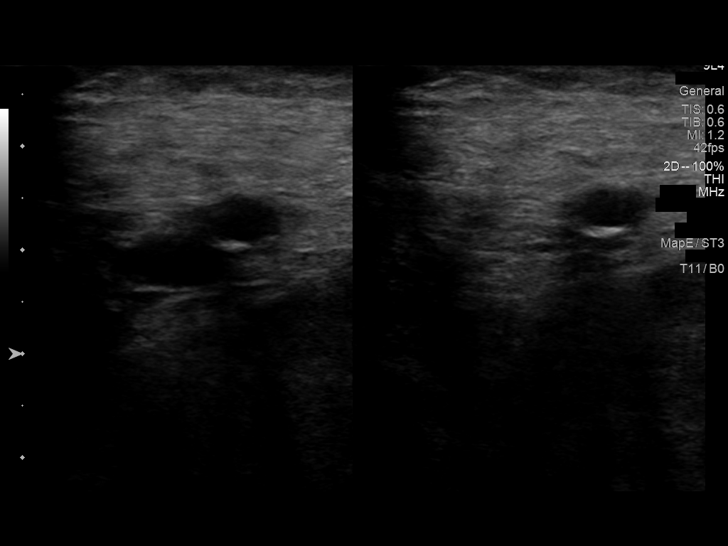
[im 7/37]
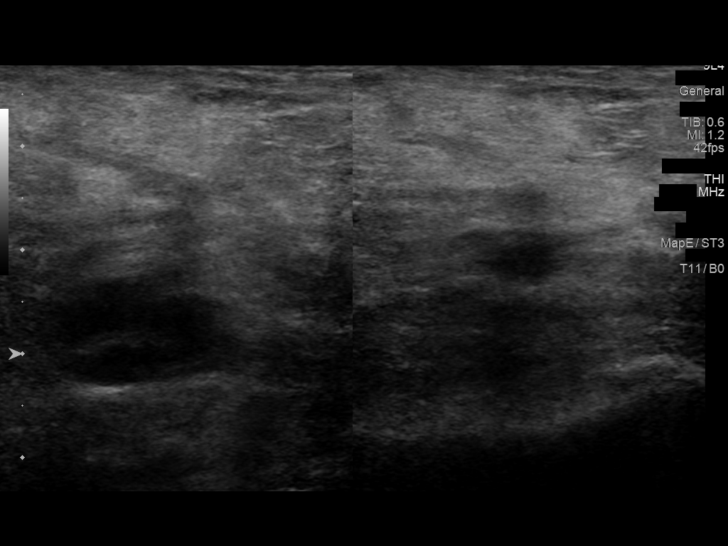
[im 10/37]
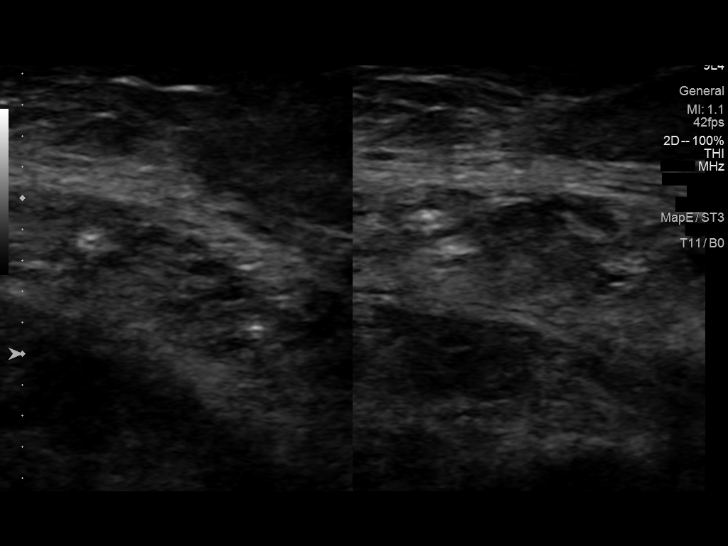
[im 11/37]
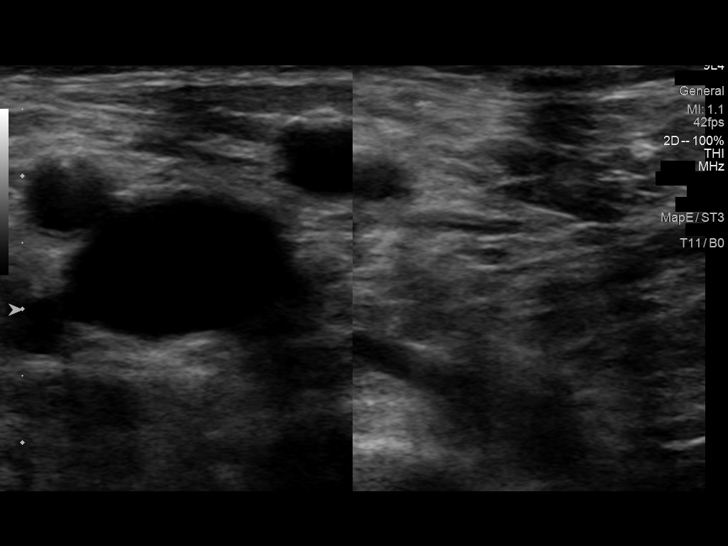
[im 15/37]
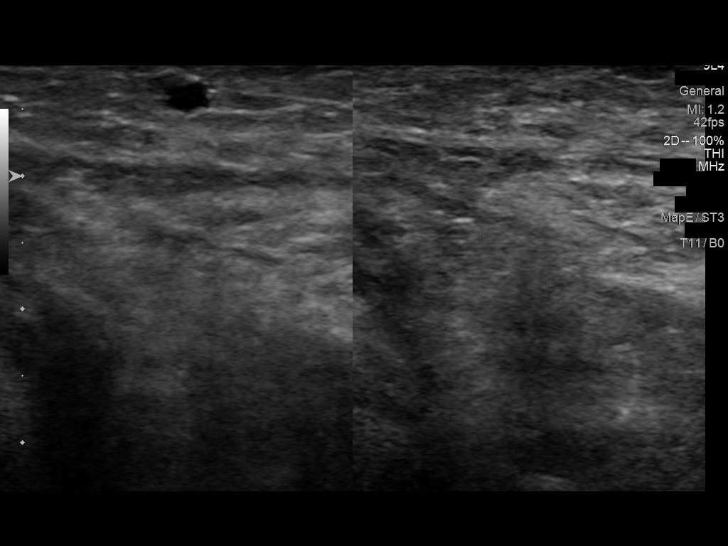
[im 18/37]
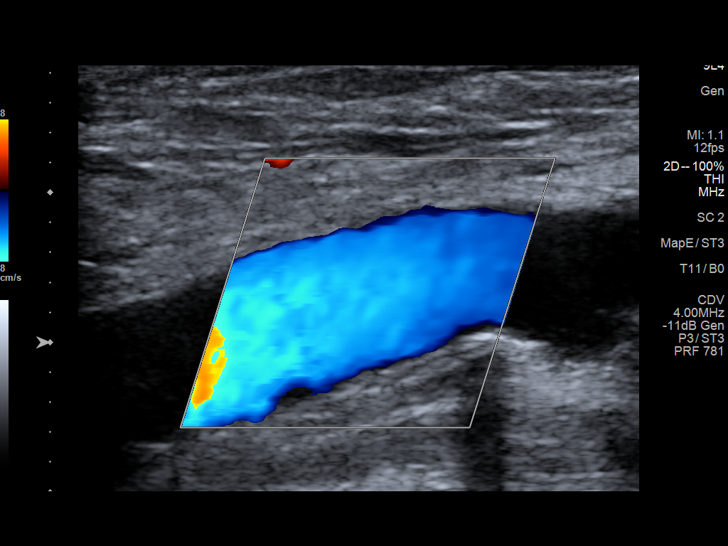
[im 19/37]
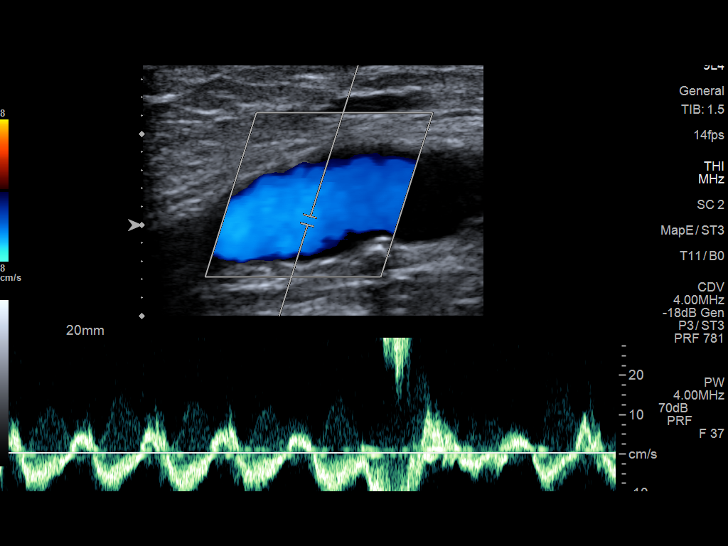
[im 22/37]
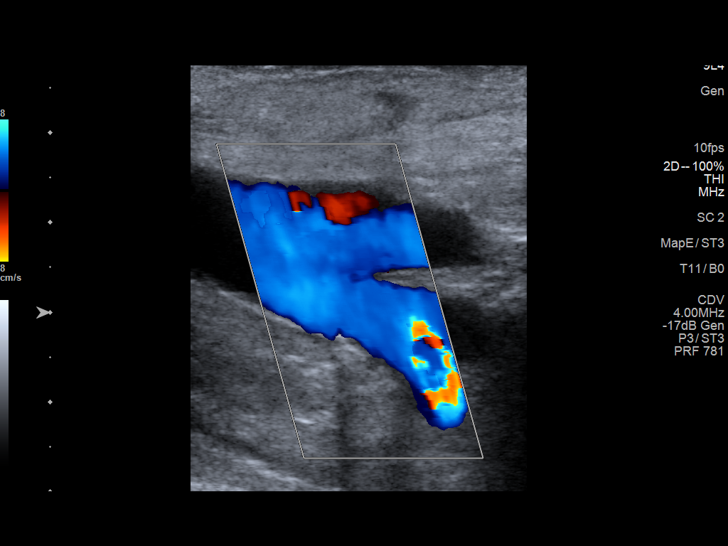
[im 26/37]
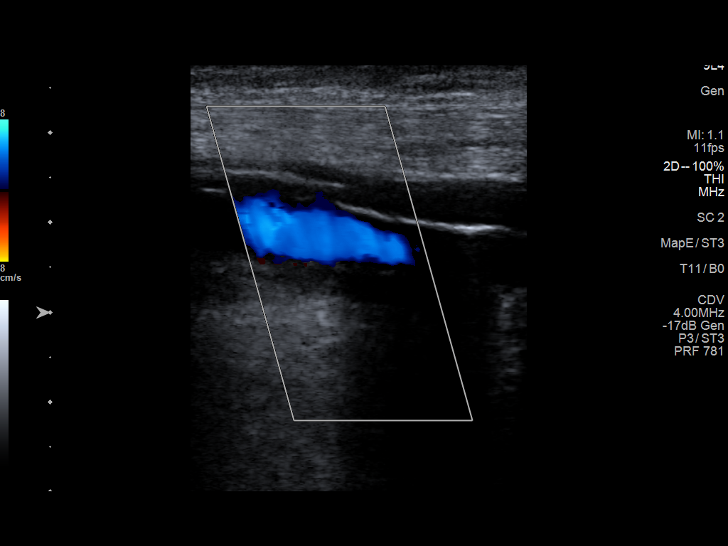
[im 29/37]
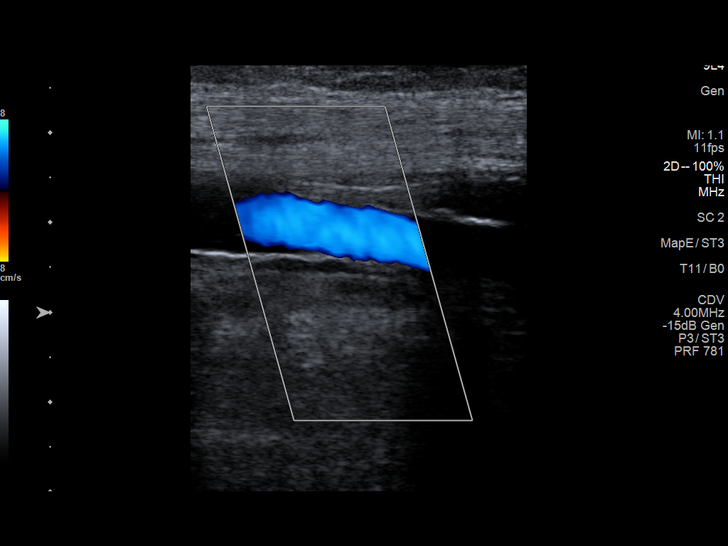
[im 30/37]
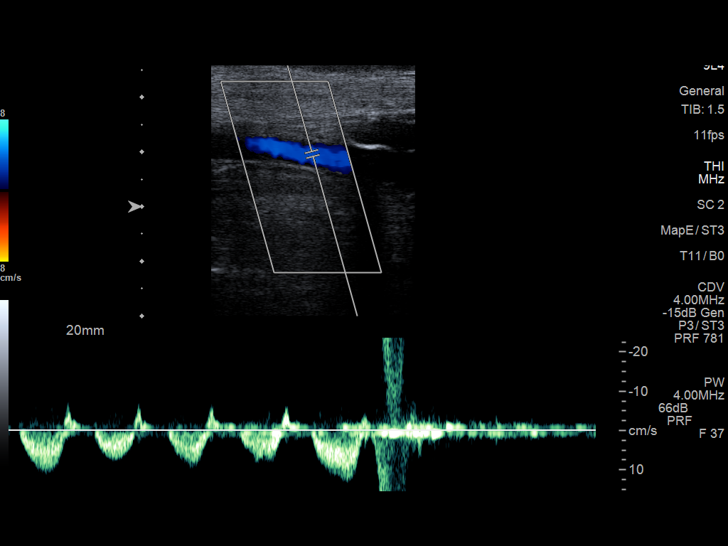
[im 33/37]
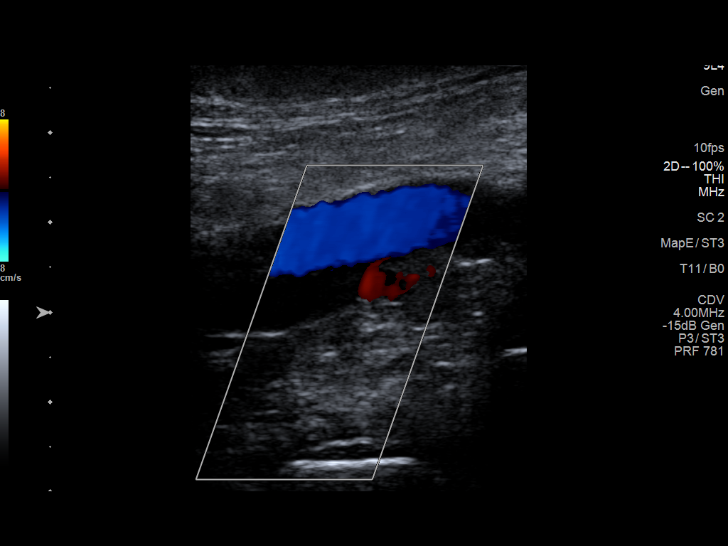
[im 37/37]
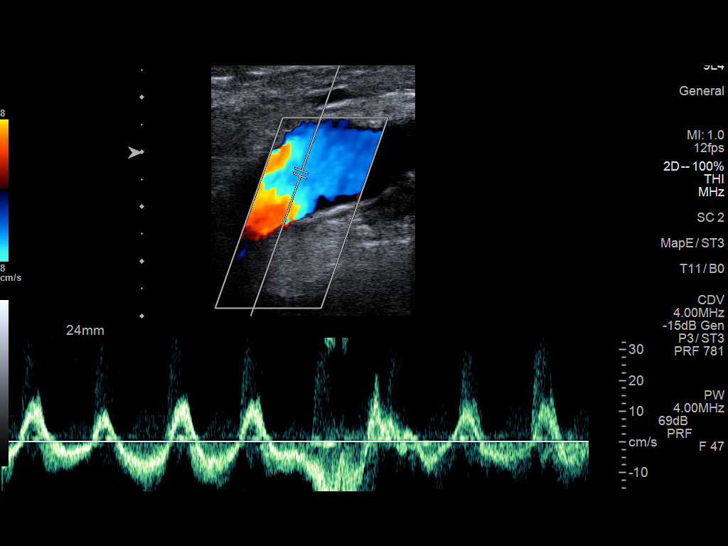

[14 of 24 positions shown; findings below may reference images not displayed]

FINDINGS: VENOUS

Normal compressibility of the common femoral, superficial femoral,
and popliteal veins, as well as the visualized calf veins.
Visualized portions of profunda femoral vein and great saphenous
vein unremarkable. No filling defects to suggest DVT on grayscale or
color Doppler imaging. Doppler waveforms show normal direction of
venous flow, normal respiratory plasticity and response to
augmentation.

Limited views of the contralateral common femoral vein are
unremarkable.

OTHER

None.

Limitations: none
IMPRESSION: Negative.

## 2022-11-04 MED ORDER — SODIUM CHLORIDE 0.9 % IV SOLN: INTRAVENOUS | Status: AC

## 2022-11-04 MED ORDER — HEPARIN (PORCINE) 25000 UT/250ML-% IV SOLN
750.0000 [IU]/h | INTRAVENOUS | Status: DC
  Administered 2022-11-04: 600 [IU]/h via INTRAVENOUS
  Filled 2022-11-04: qty 250

## 2022-11-04 NOTE — Progress Notes (Signed)
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   Allergies  Allergen Reactions   Other Other (See Comments)    Patient is to NOT EAT any foods with husks or tree nuts, popcorn   Propofol Shortness Of Breath and Other (See Comments)    "Caused asthma"   Flecainide Other (See Comments)    Reaction not recalled   Amiodarone Other (See Comments)    Delirium/Confusion/Psychosis   Amlodipine Other (See Comments)    "Tired and syncope"    Patient Measurements: Height: 5\' 3"  (160 cm) Weight: 54.7 kg (120 lb 9.5 oz) IBW/kg (Calculated) : 52.4 Heparin Dosing Weight: 54.7 kg  Vital Signs: Temp: 98.4 F (36.9 C) (10/10 2147) Temp Source: Oral (10/10 2147) BP: 103/52 (10/10 2147) Pulse Rate: 75 (10/10 2147)  Labs: Recent Labs    11/02/22 1523 11/03/22 0031 11/03/22 0222 11/04/22 0307 11/04/22 2013  HGB 9.6* 11.6* 11.4* 9.5*  --   HCT 30.1* 33.6* 33.1* 27.3*  --   PLT 186  --  141*  --   --   APTT  --   --   --   --  33  HEPARINUNFRC  --   --   --   --  0.37  CREATININE 1.33*  --  1.20* 2.12*  --     Estimated Creatinine Clearance: 19 mL/min (A) (by C-G formula based on SCr of 2.12 mg/dL (H)).   Medical History: Past Medical History:  Diagnosis Date   Achalasia    Acquired hypothyroidism 08/20/2020   Acute metabolic encephalopathy 07/25/2021   AICD (automatic cardioverter/defibrillator) present 11/14/2019   Allergic rhinitis    Ankle fracture 03/25/2022   Aortic valve disorder 03/26/2002   Atherosclerosis of abdominal aorta (HCC) 05/01/2020   Cardiomyopathy (HCC)    CHB (complete heart block) (HCC) 08/18/2017   CHF (congestive heart failure) (HCC)    Cholelithiasis without obstruction 05/01/2020   Chronic gouty arthritis    Chronic heart failure with preserved ejection fraction (HCC) 05/01/2020   Chronic kidney disease (CKD), active medical management  without dialysis, stage 3 (moderate) (HCC) 05/01/2020   Closed torus fracture of distal end of right radius with delayed healing 08/12/2021   Delusional thoughts (HCC)    Diverticulosis of colon 05/01/2020   Epistaxis    Essential (primary) hypertension 08/18/2017   Gastroesophageal reflux disease 05/01/2020   Gastrointestinal hemorrhage    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 11/14/2019   Hypercoagulability due to atrial fibrillation (HCC) 05/01/2020   Hyperlipidemia 11/14/2019   Hypocalcemia 12/14/2020   Hypoglycemia    Hypokalemia 12/14/2020   Hypomagnesemia 12/14/2020   Hypotension 12/14/2020   Idiopathic pulmonary fibrosis (HCC) 05/01/2020   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   Iron deficiency anemia    Long term (current) use of anticoagulants    Malnutrition of mild degree [name]: 75% to less than 90% of standard weight) (HCC)    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/12/2021   Mitral and aortic incompetence 11/14/2019   Non-rheumatic atrial fibrillation (HCC) 05/01/2020   Non-toxic multinodular goiter 08/20/2020   NSVT (nonsustained ventricular tachycardia) (HCC) 08/18/2017   Oropharyngeal dysphagia    Osteoarthritis of hip 05/01/2020   Osteoarthritis of knee 05/01/2020   Osteopenia of neck of left femur    Pain of left hip joint 12/12/2017   Proteinuria 05/01/2020   Raynaud's disease    Recurrent falls 04/09/2021   Rheumatoid arthritis (HCC)    Secondary adrenal  insufficiency (HCC) 05/01/2020   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   Septic shock (HCC) 03/10/2020   Slow transit constipation    Systemic lupus erythematosus (HCC) 05/01/2020   Thrombophilia (HCC)    Transient ischemic attack    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   Vitamin D deficiency     Medications:  Scheduled:   sodium chloride   Intravenous Once   allopurinol  100 mg Oral Daily   Chlorhexidine Gluconate Cloth  6 each Topical Daily   fluticasone furoate-vilanterol  1 puff  Inhalation Daily   And   umeclidinium bromide  1 puff Inhalation Daily   hydroxychloroquine  200 mg Oral Daily   levETIRAcetam  500 mg Oral BID   levothyroxine  25 mcg Oral Daily   pantoprazole (PROTONIX) IV  40 mg Intravenous Q12H   predniSONE  5 mg Oral Q breakfast   Infusions:   sodium chloride 100 mL/hr at 11/04/22 1500   heparin 600 Units/hr (11/04/22 1700)    Assessment: 77 years of age female with history of multi-vavular disease with bioprosthetic valves and atrial fibrillation on Xarelto prior to admission who presented with diverticular bleed. Xarelto was held (last dose 10/7) and Kcentra 25 units/kg and 1 unit PRBC given on 10/8 PM. No further bleeding observed. Hgb down to 9.5. Hemodynamically stable. GI consulted and approved resuming anticoagulation with IV Heparin. No boluses. If tolerates without further bleeding, plan to resume Xarelto in ~3 days. Will check both aPTT and anti-Xa levels to assess if recent Xarelto is still affecting anti-Xa levels.   Heparin level 0.37, but aPTT only 33.  Appears heparin level still being influenced by recent Xarelto use.  Will increase heparin infusion.   Goal of Therapy:  Heparin level 0.3-0.7 units/ml aPTT 66-102 Monitor platelets by anticoagulation protocol: Yes   Plan:  Increase Heparin to 750 units/hr.  aPTT and anti-Xa level in 8 hours.  Daily aPTT and anti-Xa levels until correlating.  Monitor closely for signs and symptoms of bleeding and CBC.    [employer], Pharm.D., BCPS Clinical Pharmacist  **Pharmacist phone directory can be found on [email or web address] listed under [facility].  11/04/2022 10:21 PM

## 2022-11-04 NOTE — Progress Notes (Signed)
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   Allergies  Allergen Reactions   Other Other (See Comments)    Patient is to NOT EAT any foods with husks or tree nuts, popcorn   Propofol Shortness Of Breath and Other (See Comments)    "Caused asthma"   Flecainide Other (See Comments)    Reaction not recalled   Amiodarone Other (See Comments)    Delirium/Confusion/Psychosis   Amlodipine Other (See Comments)    "Tired and syncope"    Patient Measurements: Height: 5\' 3"  (160 cm) Weight: 54.7 kg (120 lb 9.5 oz) IBW/kg (Calculated) : 52.4 Heparin Dosing Weight: 54.7 kg  Vital Signs: Temp: 98.6 F (37 C) (10/10 1107) Temp Source: Oral (10/10 1107) BP: 105/82 (10/10 0800) Pulse Rate: 68 (10/10 0800)  Labs: Recent Labs    11/02/22 1523 11/03/22 0031 11/03/22 0222 11/04/22 0307  HGB 9.6* 11.6* 11.4* 9.5*  HCT 30.1* 33.6* 33.1* 27.3*  PLT 186  --  141*  --   CREATININE 1.33*  --  1.20* 2.12*    Estimated Creatinine Clearance: 19 mL/min (A) (by C-G formula based on SCr of 2.12 mg/dL (H)).   Medical History: Past Medical History:  Diagnosis Date   Achalasia    Acquired hypothyroidism 08/20/2020   Acute metabolic encephalopathy 07/25/2021   AICD (automatic cardioverter/defibrillator) present 11/14/2019   Allergic rhinitis    Ankle fracture 03/25/2022   Aortic valve disorder 03/26/2002   Atherosclerosis of abdominal aorta (HCC) 05/01/2020   Cardiomyopathy (HCC)    CHB (complete heart block) (HCC) 08/18/2017   CHF (congestive heart failure) (HCC)    Cholelithiasis without obstruction 05/01/2020   Chronic gouty arthritis    Chronic heart failure with preserved ejection fraction (HCC) 05/01/2020   Chronic kidney disease (CKD), active medical management without dialysis, stage 3 (moderate) (HCC) 05/01/2020   Closed torus fracture of distal end of right radius with  delayed healing 08/12/2021   Delusional thoughts (HCC)    Diverticulosis of colon 05/01/2020   Epistaxis    Essential (primary) hypertension 08/18/2017   Gastroesophageal reflux disease 05/01/2020   Gastrointestinal hemorrhage    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 11/14/2019   Hypercoagulability due to atrial fibrillation (HCC) 05/01/2020   Hyperlipidemia 11/14/2019   Hypocalcemia 12/14/2020   Hypoglycemia    Hypokalemia 12/14/2020   Hypomagnesemia 12/14/2020   Hypotension 12/14/2020   Idiopathic pulmonary fibrosis (HCC) 05/01/2020   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   Iron deficiency anemia    Long term (current) use of anticoagulants    Malnutrition of mild degree [name]: 75% to less than 90% of standard weight) (HCC)    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/12/2021   Mitral and aortic incompetence 11/14/2019   Non-rheumatic atrial fibrillation (HCC) 05/01/2020   Non-toxic multinodular goiter 08/20/2020   NSVT (nonsustained ventricular tachycardia) (HCC) 08/18/2017   Oropharyngeal dysphagia    Osteoarthritis of hip 05/01/2020   Osteoarthritis of knee 05/01/2020   Osteopenia of neck of left femur    Pain of left hip joint 12/12/2017   Proteinuria 05/01/2020   Raynaud's disease    Recurrent falls 04/09/2021   Rheumatoid arthritis (HCC)    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   Septic shock (HCC) 03/10/2020   Slow transit constipation    Systemic lupus erythematosus (HCC) 05/01/2020   Thrombophilia (HCC)    Transient ischemic attack    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  Vitamin D deficiency     Medications:  Scheduled:   sodium chloride   Intravenous Once   allopurinol  100 mg Oral Daily   Chlorhexidine Gluconate Cloth  6 each Topical Daily   fluticasone furoate-vilanterol  1 puff Inhalation Daily   And   umeclidinium bromide  1 puff Inhalation Daily   hydroxychloroquine  200 mg Oral Daily    levETIRAcetam  500 mg Oral BID   levothyroxine  25 mcg Oral Daily   pantoprazole (PROTONIX) IV  40 mg Intravenous Q12H   predniSONE  5 mg Oral Q breakfast   Infusions:   Assessment: 77 years of age female with history of multi-vavular disease with bioprosthetic valves and atrial fibrillation on Xarelto prior to admission who presented with diverticular bleed. Xarelto was held (last dose 10/7) and Kcentra 25 units/kg and 1 unit PRBC given on 10/8 PM. No further bleeding observed. Hgb down to 9.5. Hemodynamically stable. GI consulted and approved resuming anticoagulation with IV Heparin. No boluses. If tolerates without further bleeding, plan to resume Xarelto in ~3 days. Will check both aPTT and anti-Xa levels to assess if recent Xarelto is still affecting anti-Xa levels.   Goal of Therapy:  Heparin level 0.3-0.7 units/ml Monitor platelets by anticoagulation protocol: Yes   Plan:  Start IV Heparin at 600 units/hr.  aPTT and anti-Xa level in 8 hours.  Daily aPTT and anti-Xa levels until correlating.  Monitor closely for signs and symptoms of bleeding and CBC.   Link Snuffer, PharmD, BCPS, BCCCP Please refer to [facility] for [facility] numbers 11/04/2022,11:17 AM

## 2022-11-04 NOTE — Progress Notes (Signed)
Pt got up with walker to [facility].  Pt urinated after having a incontinent spell in bed and had a very small type 4/5 bowel mvt that was dark brown/black and red.  Blood occurred while wiping.  Report to night nurse for on call dr.

## 2022-11-04 NOTE — Progress Notes (Signed)
PROGRESS NOTE    Lindsey Huber  VHQ:[id number] DOB: 08/06/1945 DOA: 11/02/2022 PCP: Collene Mares, PA   Brief Narrative:  Patient is a 77 yo F w/ A-fib on Xarelto, SSS/PPM, ILD/pulmonary fibrosis/lupus followed by [name], COPD, diastolic CHF, OSA, aortic, mitral, tricuspid valve disease s/p AVR/MV ring/TVR, CKD 3 AA presented to [facility] ED on 10/8 with GIB.   Patient recently admitted on 06/2022 with hematochezia and hemorrhagic shock.  Patient given Kcentra, vitamin K and pressors.CTA showing extravasation at the junction of descending and sigmoid colon and duodenal enteritis. Given 2 units of PRBCs. CTA abd/pelvis w/ extravasation from diverticulum in proximal sigmoid, consistent w/ diverticulr hemorrhage.  Admitted to PCCM, GI consulted.  Further details below.  Assessment & Plan:   Principal Problem:   Acute GI bleeding  Acute blood loss anemia secondary to lower GI bleed likely secondary to diverticular bleed/ Hemorrhagic shock Patient is on Xarelto and presented with hemoglobin 9.6.  Given Kcentra.  Given vitamin K.  Status post 2 packed red blood cells.  Hemoglobin back to 11.6 but now down to 9.5.  She has not had any rectal bleeding since more than 36 hours now.  CTA showed clear site of extravasation of blood in the sigmoid colon.  Seen by GI, they recommended to consult IR if she were to rebleed again however since she has not bled and hemoglobin is fairly stable, they have now advanced her diet to liquid and will advance to soft later.  Continue PPI.  Holding Xarelto but now she is cleared to start on heparin by GI.  Per GI, okay to resume Xarelto in 3 days.   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   #HFpEF Most recent echo showing ejection fraction of 60 to 65%.  No acute concern for exacerbation at this time.  Will continue to monitor volume  status.  Home medication includes furosemide 40 mg daily, metoprolol tartrate 12.5 mg twice daily. -Given softer blood pressures, holding this for now -Once patient has improved blood pressures can resume   # AKI on CKD stage IIIa Baseline creatinine around 1.2 with GFR of 47 but creatinine jumped to 2.12 today.  Likely due to low blood pressure.  Will need IV hydration.  Start on normal saline at 100 cc/h for 20 hours.  Repeat labs in the morning avoid nephrotoxic agents.   #History of lupus Currently on prednisone 5 mg daily at home and hydroxychloroquine 200 mg daily. -Resume home prednisone and hydroxychloroquine   #History of seizures Patient is on Keppra 500 mg twice daily at home.  No acute concerns at this time for seizures. -Continue home Keppra 500 mg twice daily  DVT prophylaxis: SCDs Start: 11/02/22 2023   Code Status: Full Code  Family Communication:  None present at bedside.  Plan of care discussed with patient in length and he/she verbalized understanding and agreed with it.  Status is: Inpatient Remains inpatient appropriate because: Needs further monitoring, advancing diet.  On IV heparin.   Estimated body mass index is 21.36 kg/m as calculated from the following:   Height as of this encounter: 5\' 3"  (1.6 m).   Weight as of this encounter: 54.7 kg.    Nutritional Assessment: Body mass index is 21.36 kg/m.Marland Kitchen Seen by dietician.  I agree with the assessment and plan as outlined below: Nutrition Status:        . Skin Assessment: I  have examined the patient's skin and I agree with the wound assessment as performed by the wound care RN as outlined below:    Consultants:  GI  Procedures:  None  Antimicrobials:  Anti-infectives (From admission, onward)    Start     Dose/Rate Route Frequency Ordered Stop   11/03/22 1330  hydroxychloroquine (PLAQUENIL) tablet 200 mg        200 mg Oral Daily 11/03/22 1235           Subjective: Seen and examined.  She  has no complaints.  Denied any rectal bleeding for more than a day now.  Objective: Vitals:   11/04/22 0756 11/04/22 0800 11/04/22 0900 11/04/22 1107  BP:  105/82    Pulse:  68    Resp:  (!) 23 (!) 26   Temp: 98.6 F (37 C)   98.6 F (37 C)  TempSrc: Axillary   Oral  SpO2:  96%    Weight:      Height:        Intake/Output Summary (Last 24 hours) at 11/04/2022 1204 Last data filed at 11/04/2022 0600 Gross per 24 hour  Intake 480 ml  Output 350 ml  Net 130 ml   Filed Weights   11/02/22 1930 11/03/22 0437 11/04/22 0418  Weight: 52.6 kg 54.7 kg 54.7 kg    Examination:  General exam: Appears calm and comfortable  Respiratory system: Clear to auscultation. Respiratory effort normal. Cardiovascular system: S1 & S2 heard, RRR. No JVD, murmurs, rubs, gallops or clicks. No pedal edema. Gastrointestinal system: Abdomen is nondistended, soft and nontender. No organomegaly or masses felt. Normal bowel sounds heard. Central nervous system: Alert and oriented. No focal neurological deficits. Extremities: Symmetric 5 x 5 power. Skin: No rashes, lesions or ulcers Psychiatry: Judgement and insight appear normal. Mood & affect appropriate.    Data Reviewed: I have personally reviewed following labs and imaging studies  CBC: Recent Labs  Lab 11/02/22 1523 11/03/22 0031 11/03/22 0222 11/04/22 0307  WBC 11.1*  --  10.2  --   NEUTROABS 7.7  --   --   --   HGB 9.6* 11.6* 11.4* 9.5*  HCT 30.1* 33.6* 33.1* 27.3*  MCV 102.4*  --  91.7  --   PLT 186  --  141*  --    Basic Metabolic Panel: Recent Labs  Lab 11/02/22 1523 11/03/22 0222 11/04/22 0307  NA 137 140 131*  K 3.5 4.2 3.7  CL 103 102 95*  CO2 26 23 25   GLUCOSE 142* 102* 114*  BUN 26* 31* 38*  CREATININE 1.33* 1.20* 2.12*  CALCIUM 8.6* 8.7* 8.1*   GFR: Estimated Creatinine Clearance: 19 mL/min (A) (by C-G formula based on SCr of 2.12 mg/dL (H)). Liver Function Tests: Recent Labs  Lab 11/02/22 1523  AST 26  ALT  20  ALKPHOS 71  BILITOT 0.6  PROT 6.5  ALBUMIN 2.7*   No results for input(s): "LIPASE", "AMYLASE" in the last 168 hours. No results for input(s): "AMMONIA" in the last 168 hours. Coagulation Profile: No results for input(s): "INR", "PROTIME" in the last 168 hours. Cardiac Enzymes: No results for input(s): "CKTOTAL", "CKMB", "CKMBINDEX", "TROPONINI" in the last 168 hours. BNP (last 3 results) No results for input(s): "PROBNP" in the last 8760 hours. HbA1C: No results for input(s): "HGBA1C" in the last 72 hours. CBG: Recent Labs  Lab 11/03/22 1128 11/03/22 1925 11/03/22 2320 11/04/22 0332 11/04/22 0753  GLUCAP 87 180* 114* 112* 109*   Lipid Profile:  No results for input(s): "CHOL", "HDL", "LDLCALC", "TRIG", "CHOLHDL", "LDLDIRECT" in the last 72 hours. Thyroid Function Tests: No results for input(s): "TSH", "T4TOTAL", "FREET4", "T3FREE", "THYROIDAB" in the last 72 hours. Anemia Panel: No results for input(s): "VITAMINB12", "FOLATE", "FERRITIN", "TIBC", "IRON", "RETICCTPCT" in the last 72 hours. Sepsis Labs: Recent Labs  Lab 11/02/22 1644  LATICACIDVEN 2.0*    Recent Results (from the past 240 hour(s))  MRSA Next Gen by PCR, Nasal     Status: None   Collection Time: 11/02/22 10:12 PM   Specimen: Nasal Mucosa; Nasal Swab  Result Value Ref Range Status   MRSA by PCR Next Gen NOT DETECTED NOT DETECTED Final    Comment: (NOTE) The GeneXpert MRSA Assay (FDA approved for NASAL specimens only), is one component of a comprehensive MRSA colonization surveillance program. It is not intended to diagnose MRSA infection nor to guide or monitor treatment for MRSA infections. Test performance is not FDA approved in patients less than 57 years old. Performed at [facility], [address] [place], [place] [address]      Radiology Studies: CT Angio Abd/Pel W and/or Wo Contrast  Result Date: 11/02/2022 CLINICAL DATA:  Rectal bleeding EXAM: CTA ABDOMEN AND PELVIS WITHOUT  AND WITH CONTRAST TECHNIQUE: Multidetector CT imaging of the abdomen and pelvis was performed using the standard protocol during bolus administration of intravenous contrast. Multiplanar reconstructed images and MIPs were obtained and reviewed to evaluate the vascular anatomy. RADIATION DOSE REDUCTION: This exam was performed according to the departmental dose-optimization program which includes automated exposure control, adjustment of the mA and/or kV according to patient size and/or use of iterative reconstruction technique. CONTRAST:  75mL OMNIPAQUE IOHEXOL 350 MG/ML SOLN COMPARISON:  01/31/2020 FINDINGS: VASCULAR Normal contour and caliber of the abdominal aorta. No evidence of aneurysm, dissection, or other acute aortic pathology. 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 Moderate mixed calcific atherosclerosis. Review of the MIP images confirms the above findings. NON-VASCULAR Lower Chest: No acute findings. Gross cardiomegaly. Coronary artery calcifications Hepatobiliary: No solid liver abnormality is seen. Small gallstones. No gallbladder wall thickening, or biliary dilatation. Pancreas: Unremarkable. No pancreatic ductal dilatation or surrounding inflammatory changes. Spleen: Normal in size without significant abnormality. Adrenals/Urinary Tract: Adrenal glands are unremarkable. Kidneys are normal, without renal calculi, solid lesion, or hydronephrosis. Bladder is unremarkable. Stomach/Bowel: Stomach is within normal limits. Normal appendix. Descending and sigmoid diverticulosis. Active contrast extravasation from a diverticulum in the proximal sigmoid (series 17, image 145). Lymphatic: No enlarged abdominal or pelvic lymph nodes. Reproductive: Calcified uterine fibroids. Other: No abdominal wall hernia or abnormality. No ascites. Musculoskeletal: No acute osseous findings. IMPRESSION: 1. Active contrast extravasation  from a diverticulum in the proximal sigmoid, consistent with diverticular hemorrhage. 2. Descending and sigmoid diverticulosis. 3. Cholelithiasis. 4. Calcified uterine fibroids. Call report request was placed at the time of interpretation. Report issued at this time in the interest of expediency. Final communication of critical findings will be documented. Aortic Atherosclerosis (ICD10-I70.0). Electronically Signed   By: Jearld Lesch M.D.   On: 11/02/2022 18:39    Scheduled Meds:  sodium chloride   Intravenous Once   allopurinol  100 mg Oral Daily   Chlorhexidine Gluconate Cloth  6 each Topical Daily   fluticasone furoate-vilanterol  1 puff Inhalation Daily   And   umeclidinium bromide  1 puff Inhalation Daily   hydroxychloroquine  200 mg Oral Daily   levETIRAcetam  500 mg  Oral BID   levothyroxine  25 mcg Oral Daily   pantoprazole (PROTONIX) IV  40 mg Intravenous Q12H   predniSONE  5 mg Oral Q breakfast   Continuous Infusions:  heparin 600 Units/hr (11/04/22 1152)     LOS: 2 days   Hughie Closs, MD [facility]  11/04/2022, 12:04 PM   *Please note that this is a verbal dictation therefore any spelling or grammatical errors are due to the "Dragon [employer] One" system interpretation.  Please page via Amion and do not message via secure chat for urgent patient care matters. Secure chat can be used for non urgent patient care matters.  How to contact the [facility] Attending or Consulting provider 7A - 7P or covering provider during after hours 7P -7A, for this patient?  Check the care team in [facility] and look for a) attending/consulting [facility] provider listed and b) the [facility] team listed. Page or secure chat 7A-7P. Log into [email or web address] and use 's universal password to access. If you do not have the password, please contact the hospital operator. Locate the [facility] provider you are looking for under [facility] and page to a number that you can be directly reached. If you still  have difficulty reaching the provider, please page the [facility] (Director on Call) for the Hospitalists listed on amion for assistance.

## 2022-11-04 NOTE — Plan of Care (Signed)

## 2022-11-04 NOTE — Progress Notes (Signed)
Subjective: Patient not had any rectal bleeding or above movement since 2 nights ago. She is tolerating clear liquids without nausea or vomiting and denies abdominal pain.  Objective: Vital signs in last 24 hours: Temp:  [98.1 F (36.7 C)-98.8 F (37.1 C)] 98.6 F (37 C) (10/10 0756) Pulse Rate:  [69-81] 69 (10/10 0600) Resp:  [19-29] 27 (10/10 0600) BP: (103-127)/(52-91) 123/56 (10/10 0600) SpO2:  [97 %-100 %] 99 % (10/10 0600) Weight:  [54.7 kg] 54.7 kg (10/10 0418) Weight change: 2.083 kg Last BM Date : 11/02/22  PE: Elderly, pleasant GENERAL: Mild pallor  ABDOMEN: Soft, nondistended, nontender, normoactive bowel sounds EXTREMITIES: No deformity  Lab Results: Results for orders placed or performed during the hospital encounter of 11/02/22 (from the past 48 hour(s))  Comprehensive metabolic panel     Status: Abnormal   Collection Time: 11/02/22  3:23 PM  Result Value Ref Range   Sodium 137 135 - 145 mmol/L   Potassium 3.5 3.5 - 5.1 mmol/L   Chloride 103 98 - 111 mmol/L   CO2 26 22 - 32 mmol/L   Glucose, Bld 142 (H) 70 - 99 mg/dL    Comment: Glucose reference range applies only to samples taken after fasting for at least 8 hours.   BUN 26 (H) 8 - 23 mg/dL   Creatinine, Ser 4.78 (H) 0.44 - 1.00 mg/dL   Calcium 8.6 (L) 8.9 - 10.3 mg/dL   Total Protein 6.5 6.5 - 8.1 g/dL   Albumin 2.7 (L) 3.5 - 5.0 g/dL   AST 26 15 - 41 U/L   ALT 20 0 - 44 U/L   Alkaline Phosphatase 71 38 - 126 U/L   Total Bilirubin 0.6 0.3 - 1.2 mg/dL   GFR, Estimated 42 (L) >60 mL/min    Comment: (NOTE) Calculated using the CKD-EPI Creatinine Equation (2021)    Anion gap 8 5 - 15    Comment: Performed at [facility], [address] [place], [place] [address]  CBC with Differential     Status: Abnormal   Collection Time: 11/02/22  3:23 PM  Result Value Ref Range   WBC 11.1 (H) 4.0 - 10.5 K/uL   RBC 2.94 (L) 3.87 - 5.11 MIL/uL   Hemoglobin 9.6 (L) 12.0 - 15.0 g/dL   HCT 13.0 (L) 86.5 -  46.0 %   MCV 102.4 (H) 80.0 - 100.0 fL   MCH 32.7 26.0 - 34.0 pg   MCHC 31.9 30.0 - 36.0 g/dL   RDW 78.4 69.6 - 29.5 %   Platelets 186 150 - 400 K/uL   nRBC 0.0 0.0 - 0.2 %   Neutrophils Relative % 70 %   Neutro Abs 7.7 1.7 - 7.7 K/uL   Lymphocytes Relative 25 %   Lymphs Abs 2.7 0.7 - 4.0 K/uL   Monocytes Relative 5 %   Monocytes Absolute 0.6 0.1 - 1.0 K/uL   Eosinophils Relative 0 %   Eosinophils Absolute 0.0 0.0 - 0.5 K/uL   Basophils Relative 0 %   Basophils Absolute 0.0 0.0 - 0.1 K/uL   Immature Granulocytes 0 %   Abs Immature Granulocytes 0.04 0.00 - 0.07 K/uL    Comment: Performed at [facility], [address], [place], [place] [address]  Type and screen [facility]     Status: None (Preliminary result)   Collection Time: 11/02/22  3:25 PM  Result Value Ref Range   ABO/RH(D) A NEG    Antibody Screen POS  Sample Expiration 11/05/2022,2359    DAT, IgG POS    Antibody Identification ANTI D    Antibody ID,T [place] NO SPECIFIC ANTIBODY DEMONSTRATED IN ELUATE    Unit Number [id number]    Blood Component Type RED CELLS,LR    Unit division A0    Status of Unit ISSUED,FINAL    Unit tag comment VERBAL ORDERS PER DR LOCKWOOD    Transfusion Status OK TO TRANSFUSE    Crossmatch Result COMPATIBLE    Unit Number [id number]    Blood Component Type RED CELLS,LR    Unit division 00    Status of Unit ISSUED,FINAL    Unit tag comment VERBAL ORDERS PER DR LOCKWOOD    Transfusion Status OK TO TRANSFUSE    Crossmatch Result COMPATIBLE    Unit Number [id number]    Blood Component Type RBC LR PHER2    Unit division 00    Status of Unit ALLOCATED    Transfusion Status OK TO TRANSFUSE    Crossmatch Result COMPATIBLE    Unit Number [id number]    Blood Component Type RBC LR PHER1    Unit division 00    Status of Unit ALLOCATED    Transfusion Status OK TO TRANSFUSE    Crossmatch Result COMPATIBLE   I-Stat Lactic Acid, ED     Status: Abnormal    Collection Time: 11/02/22  4:44 PM  Result Value Ref Range   Lactic Acid, Venous 2.0 (HH) 0.5 - 1.9 mmol/L   Comment NOTIFIED PHYSICIAN   Prepare RBC     Status: None   Collection Time: 11/02/22  4:48 PM  Result Value Ref Range   Order Confirmation      ORDER PROCESSED BY BLOOD BANK Performed at [facility], [address], [place], [place] [address]   CBG monitoring, ED     Status: None   Collection Time: 11/02/22  8:37 PM  Result Value Ref Range   Glucose-Capillary 97 70 - 99 mg/dL    Comment: Glucose reference range applies only to samples taken after fasting for at least 8 hours.  MRSA Next Gen by PCR, Nasal     Status: None   Collection Time: 11/02/22 10:12 PM   Specimen: Nasal Mucosa; Nasal Swab  Result Value Ref Range   MRSA by PCR Next Gen NOT DETECTED NOT DETECTED    Comment: (NOTE) The GeneXpert MRSA Assay (FDA approved for NASAL specimens only), is one component of a comprehensive MRSA colonization surveillance program. It is not intended to diagnose MRSA infection nor to guide or monitor treatment for MRSA infections. Test performance is not FDA approved in patients less than 27 years old. Performed at [facility], [address], [place], [place] [address]   Glucose, capillary     Status: None   Collection Time: 11/02/22 10:22 PM  Result Value Ref Range   Glucose-Capillary 96 70 - 99 mg/dL    Comment: Glucose reference range applies only to samples taken after fasting for at least 8 hours.  Hemoglobin and hematocrit, blood     Status: Abnormal   Collection Time: 11/03/22 12:31 AM  Result Value Ref Range   Hemoglobin 11.6 (L) 12.0 - 15.0 g/dL   HCT 47.4 (L) 25.9 - 56.3 %    Comment: Performed at [facility], [address] [place], [place] [address]  CBC     Status: Abnormal   Collection Time: 11/03/22  2:22 AM  Result Value Ref Range  WBC 10.2 4.0 - 10.5 K/uL   RBC 3.61 (L) 3.87 - 5.11 MIL/uL   Hemoglobin 11.4 (L) 12.0 - 15.0 g/dL    HCT 14.7 (L) 82.9 - 46.0 %   MCV 91.7 80.0 - 100.0 fL    Comment: REPEATED TO VERIFY DELTA CHECK NOTED    MCH 31.6 26.0 - 34.0 pg   MCHC 34.4 30.0 - 36.0 g/dL   RDW 56.2 (H) 13.0 - 86.5 %   Platelets 141 (L) 150 - 400 K/uL   nRBC 0.0 0.0 - 0.2 %    Comment: Performed at [facility], [address], [place], [place] [address]  Basic metabolic panel     Status: Abnormal   Collection Time: 11/03/22  2:22 AM  Result Value Ref Range   Sodium 140 135 - 145 mmol/L   Potassium 4.2 3.5 - 5.1 mmol/L   Chloride 102 98 - 111 mmol/L   CO2 23 22 - 32 mmol/L   Glucose, Bld 102 (H) 70 - 99 mg/dL    Comment: Glucose reference range applies only to samples taken after fasting for at least 8 hours.   BUN 31 (H) 8 - 23 mg/dL   Creatinine, Ser 6.29 (H) 0.44 - 1.00 mg/dL   Calcium 8.7 (L) 8.9 - 10.3 mg/dL   GFR, Estimated 47 (L) >60 mL/min    Comment: (NOTE) Calculated using the CKD-EPI Creatinine Equation (2021)    Anion gap 15 5 - 15    Comment: Performed at [facility], [address] [place], [place] [address]  Glucose, capillary     Status: None   Collection Time: 11/03/22  3:35 AM  Result Value Ref Range   Glucose-Capillary 75 70 - 99 mg/dL    Comment: Glucose reference range applies only to samples taken after fasting for at least 8 hours.  Glucose, capillary     Status: None   Collection Time: 11/03/22  8:35 AM  Result Value Ref Range   Glucose-Capillary 80 70 - 99 mg/dL    Comment: Glucose reference range applies only to samples taken after fasting for at least 8 hours.  Glucose, capillary     Status: None   Collection Time: 11/03/22 11:28 AM  Result Value Ref Range   Glucose-Capillary 87 70 - 99 mg/dL    Comment: Glucose reference range applies only to samples taken after fasting for at least 8 hours.  Glucose, capillary     Status: Abnormal   Collection Time: 11/03/22  7:25 PM  Result Value Ref Range   Glucose-Capillary 180 (H) 70 - 99 mg/dL    Comment: Glucose  reference range applies only to samples taken after fasting for at least 8 hours.  Glucose, capillary     Status: Abnormal   Collection Time: 11/03/22 11:20 PM  Result Value Ref Range   Glucose-Capillary 114 (H) 70 - 99 mg/dL    Comment: Glucose reference range applies only to samples taken after fasting for at least 8 hours.  Hemoglobin and hematocrit, blood     Status: Abnormal   Collection Time: 11/04/22  3:07 AM  Result Value Ref Range   Hemoglobin 9.5 (L) 12.0 - 15.0 g/dL   HCT 32.4 (L) 40.1 - 02.7 %    Comment: Performed at [facility], [address] [place], [place] [address]  Basic metabolic panel     Status: Abnormal   Collection Time: 11/04/22  3:07 AM  Result Value Ref Range  Sodium 131 (L) 135 - 145 mmol/L    Comment: DELTA CHECK NOTED   Potassium 3.7 3.5 - 5.1 mmol/L   Chloride 95 (L) 98 - 111 mmol/L   CO2 25 22 - 32 mmol/L   Glucose, Bld 114 (H) 70 - 99 mg/dL    Comment: Glucose reference range applies only to samples taken after fasting for at least 8 hours.   BUN 38 (H) 8 - 23 mg/dL   Creatinine, Ser 8.29 (H) 0.44 - 1.00 mg/dL   Calcium 8.1 (L) 8.9 - 10.3 mg/dL   GFR, Estimated 24 (L) >60 mL/min    Comment: (NOTE) Calculated using the CKD-EPI Creatinine Equation (2021)    Anion gap 11 5 - 15    Comment: Performed at [facility], [address], [place], [place] [address]  Glucose, capillary     Status: Abnormal   Collection Time: 11/04/22  3:32 AM  Result Value Ref Range   Glucose-Capillary 112 (H) 70 - 99 mg/dL    Comment: Glucose reference range applies only to samples taken after fasting for at least 8 hours.  Glucose, capillary     Status: Abnormal   Collection Time: 11/04/22  7:53 AM  Result Value Ref Range   Glucose-Capillary 109 (H) 70 - 99 mg/dL    Comment: Glucose reference range applies only to samples taken after fasting for at least 8 hours.    Studies/Results: CT Angio Abd/Pel W and/or Wo Contrast  Result Date:  11/02/2022 CLINICAL DATA:  Rectal bleeding EXAM: CTA ABDOMEN AND PELVIS WITHOUT AND WITH CONTRAST TECHNIQUE: Multidetector CT imaging of the abdomen and pelvis was performed using the standard protocol during bolus administration of intravenous contrast. Multiplanar reconstructed images and MIPs were obtained and reviewed to evaluate the vascular anatomy. RADIATION DOSE REDUCTION: This exam was performed according to the departmental dose-optimization program which includes automated exposure control, adjustment of the mA and/or kV according to patient size and/or use of iterative reconstruction technique. CONTRAST:  75mL OMNIPAQUE IOHEXOL 350 MG/ML SOLN COMPARISON:  01/31/2020 FINDINGS: VASCULAR Normal contour and caliber of the abdominal aorta. No evidence of aneurysm, dissection, or other acute aortic pathology. 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 Moderate mixed calcific atherosclerosis. Review of the MIP images confirms the above findings. NON-VASCULAR Lower Chest: No acute findings. Gross cardiomegaly. Coronary artery calcifications Hepatobiliary: No solid liver abnormality is seen. Small gallstones. No gallbladder wall thickening, or biliary dilatation. Pancreas: Unremarkable. No pancreatic ductal dilatation or surrounding inflammatory changes. Spleen: Normal in size without significant abnormality. Adrenals/Urinary Tract: Adrenal glands are unremarkable. Kidneys are normal, without renal calculi, solid lesion, or hydronephrosis. Bladder is unremarkable. Stomach/Bowel: Stomach is within normal limits. Normal appendix. Descending and sigmoid diverticulosis. Active contrast extravasation from a diverticulum in the proximal sigmoid (series 17, image 145). Lymphatic: No enlarged abdominal or pelvic lymph nodes. Reproductive: Calcified uterine fibroids. Other: No abdominal wall hernia or abnormality. No ascites.  Musculoskeletal: No acute osseous findings. IMPRESSION: 1. Active contrast extravasation from a diverticulum in the proximal sigmoid, consistent with diverticular hemorrhage. 2. Descending and sigmoid diverticulosis. 3. Cholelithiasis. 4. Calcified uterine fibroids. Call report request was placed at the time of interpretation. Report issued at this time in the interest of expediency. Final communication of critical findings will be documented. Aortic Atherosclerosis (ICD10-I70.0). Electronically Signed   By: Jearld Lesch M.D.   On: 11/02/2022 18:39    Medications: I have reviewed the patient's  current medications.  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 Okay to start patient on IV heparin today, if there is no further evidence of GI bleed, okay to resume Xarelto in 3 days. Dr. Ewing Schlein will follow from tomorrow.  Kerin Salen, MD 11/04/2022, 8:24 AM

## 2022-11-05 DIAGNOSIS — K922 Gastrointestinal hemorrhage, unspecified: Secondary | ICD-10-CM | POA: Diagnosis not present

## 2022-11-05 LAB — APTT
aPTT: >200 s (ref 24–36)
aPTT: 128 s — ABNORMAL HIGH (ref 24–36)

## 2022-11-05 LAB — CBC
HCT: 22.1 % — ABNORMAL LOW (ref 36.0–46.0)
Hemoglobin: 7.4 g/dL — ABNORMAL LOW (ref 12.0–15.0)
MCH: 32.3 pg (ref 26.0–34.0)
MCHC: 33.5 g/dL (ref 30.0–36.0)
MCV: 96.5 fL (ref 80.0–100.0)
Platelets: 115 10*3/uL — ABNORMAL LOW (ref 150–400)
RBC: 2.29 MIL/uL — ABNORMAL LOW (ref 3.87–5.11)
RDW: 15.1 % (ref 11.5–15.5)
WBC: 7.4 10*3/uL (ref 4.0–10.5)
nRBC: 0.4 % — ABNORMAL HIGH (ref 0.0–0.2)

## 2022-11-05 LAB — HEMOGLOBIN AND HEMATOCRIT, BLOOD
HCT: 21.6 % — ABNORMAL LOW (ref 36.0–46.0)
Hemoglobin: 7.2 g/dL — ABNORMAL LOW (ref 12.0–15.0)

## 2022-11-05 LAB — HEPARIN LEVEL (UNFRACTIONATED)
Heparin Unfractionated: 0.99 [IU]/mL — ABNORMAL HIGH (ref 0.30–0.70)
Heparin Unfractionated: 0.86 [IU]/mL — ABNORMAL HIGH (ref 0.30–0.70)

## 2022-11-05 LAB — BASIC METABOLIC PANEL
Anion gap: 10 (ref 5–15)
BUN: 29 mg/dL — ABNORMAL HIGH (ref 8–23)
CO2: 23 mmol/L (ref 22–32)
Calcium: 8.2 mg/dL — ABNORMAL LOW (ref 8.9–10.3)
Chloride: 99 mmol/L (ref 98–111)
Creatinine, Ser: 1.34 mg/dL — ABNORMAL HIGH (ref 0.44–1.00)
GFR, Estimated: 41 mL/min — ABNORMAL LOW (ref >60)
Glucose, Bld: 97 mg/dL (ref 70–99)
Potassium: 3.7 mmol/L (ref 3.5–5.1)
Sodium: 132 mmol/L — ABNORMAL LOW (ref 135–145)

## 2022-11-05 LAB — OCCULT BLOOD X 1 CARD TO LAB, STOOL: Fecal Occult Bld: POSITIVE — AB

## 2022-11-05 MED ORDER — HEPARIN (PORCINE) 25000 UT/250ML-% IV SOLN
550.0000 [IU]/h | INTRAVENOUS | Status: DC
  Administered 2022-11-05: 650 [IU]/h via INTRAVENOUS
  Administered 2022-11-06: 550 [IU]/h via INTRAVENOUS
  Filled 2022-11-05: qty 250

## 2022-11-05 NOTE — Progress Notes (Signed)
Per Pharm consult,stop Heparin for 1 HR. Pharmacy to contact MD

## 2022-11-05 NOTE — Progress Notes (Signed)
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   Allergies  Allergen Reactions   Other Other (See Comments)    Patient is to NOT EAT any foods with husks or tree nuts, popcorn   Propofol Shortness Of Breath and Other (See Comments)    "Caused asthma"   Flecainide Other (See Comments)    Reaction not recalled   Amiodarone Other (See Comments)    Delirium/Confusion/Psychosis   Amlodipine Other (See Comments)    "Tired and syncope"    Patient Measurements: Height: 5\' 3"  (160 cm) Weight: 57.5 kg (126 lb 12.2 oz) IBW/kg (Calculated) : 52.4 Heparin Dosing Weight: 54.7 kg  Vital Signs: Temp: 98.6 F (37 C) (10/11 0923) BP: 120/59 (10/11 1710) Pulse Rate: 72 (10/11 1710)  Labs: Recent Labs    11/03/22 0222 11/04/22 0307 11/04/22 2013 11/05/22 0526 11/05/22 1150 11/05/22 1633  HGB 11.4* 9.5*  --  7.4* 7.2*  --   HCT 33.1* 27.3*  --  22.1* 21.6*  --   PLT 141*  --   --  115*  --   --   APTT  --   --  33 >200*  --  128*  HEPARINUNFRC  --   --  0.37 0.99*  --  0.86*  CREATININE 1.20* 2.12*  --   --  1.34*  --     Estimated Creatinine Clearance: 30 mL/min (A) (by C-G formula based on SCr of 1.34 mg/dL (H)).   Medical History: Past Medical History:  Diagnosis Date   Achalasia    Acquired hypothyroidism 08/20/2020   Acute metabolic encephalopathy 07/25/2021   AICD (automatic cardioverter/defibrillator) present 11/14/2019   Allergic rhinitis    Ankle fracture 03/25/2022   Aortic valve disorder 03/26/2002   Atherosclerosis of abdominal aorta (HCC) 05/01/2020   Cardiomyopathy (HCC)    CHB (complete heart block) (HCC) 08/18/2017   CHF (congestive heart failure) (HCC)    Cholelithiasis without obstruction 05/01/2020   Chronic gouty arthritis    Chronic heart failure with preserved ejection fraction (HCC) 05/01/2020   Chronic kidney disease (CKD), active medical  management without dialysis, stage 3 (moderate) (HCC) 05/01/2020   Closed torus fracture of distal end of right radius with delayed healing 08/12/2021   Delusional thoughts (HCC)    Diverticulosis of colon 05/01/2020   Epistaxis    Essential (primary) hypertension 08/18/2017   Gastroesophageal reflux disease 05/01/2020   Gastrointestinal hemorrhage    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 11/14/2019   Hypercoagulability due to atrial fibrillation (HCC) 05/01/2020   Hyperlipidemia 11/14/2019   Hypocalcemia 12/14/2020   Hypoglycemia    Hypokalemia 12/14/2020   Hypomagnesemia 12/14/2020   Hypotension 12/14/2020   Idiopathic pulmonary fibrosis (HCC) 05/01/2020   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   Iron deficiency anemia    Long term (current) use of anticoagulants    Malnutrition of mild degree [name]: 75% to less than 90% of standard weight) (HCC)    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/12/2021   Mitral and aortic incompetence 11/14/2019   Non-rheumatic atrial fibrillation (HCC) 05/01/2020   Non-toxic multinodular goiter 08/20/2020   NSVT (nonsustained ventricular tachycardia) (HCC) 08/18/2017   Oropharyngeal dysphagia    Osteoarthritis of hip 05/01/2020   Osteoarthritis of knee 05/01/2020   Osteopenia of neck of left femur    Pain of left hip joint 12/12/2017   Proteinuria 05/01/2020   Raynaud's disease    Recurrent falls 04/09/2021   Rheumatoid  arthritis (HCC)    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   Septic shock (HCC) 03/10/2020   Slow transit constipation    Systemic lupus erythematosus (HCC) 05/01/2020   Thrombophilia (HCC)    Transient ischemic attack    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   Vitamin D deficiency     Medications:  Scheduled:   sodium chloride   Intravenous Once   allopurinol  100 mg Oral Daily   Chlorhexidine Gluconate Cloth  6 each Topical Daily   fluticasone  furoate-vilanterol  1 puff Inhalation Daily   And   umeclidinium bromide  1 puff Inhalation Daily   hydroxychloroquine  200 mg Oral Daily   levETIRAcetam  500 mg Oral BID   levothyroxine  25 mcg Oral Daily   pantoprazole (PROTONIX) IV  40 mg Intravenous Q12H   predniSONE  5 mg Oral Q breakfast   Infusions:   heparin 650 Units/hr (11/05/22 0847)    Assessment: 77 years of age female with history of multi-vavular disease with bioprosthetic valves and atrial fibrillation on Xarelto prior to admission who presented with diverticular bleed. Xarelto was held (last dose 10/7) and Kcentra 25 units/kg and 1 unit PRBC given on 10/8 PM. No further bleeding observed. Hgb down to 9.5. Hemodynamically stable. GI consulted and approved resuming anticoagulation with IV Heparin. No boluses. If tolerates without further bleeding, plan to resume Xarelto in ~3 days. Will check both aPTT and anti-Xa levels to assess if recent Xarelto is still affecting anti-Xa levels.   Heparin level and aPTT appear to be correlating with both slightly elevated on 650 units/hr.  Heparin level 0.86, aPTT 128.  Pt / RN reporting dark stools in the setting of recent GIB.  MD aware.  OK to continue heparin.   Goal of Therapy:  Heparin level 0.3-0.7 units/ml aPTT 66-102 Monitor platelets by anticoagulation protocol: Yes   Plan:  Reduce Heparin to 550 units/hr.  Heparin level in 8 hours.  Daily heparin levels and CBC.  Monitor closely for signs and symptoms of bleeding.    [employer], Pharm.D., BCPS Clinical Pharmacist  **Pharmacist phone directory can be found on [email or web address] listed under [facility].  11/05/2022 6:06 PM

## 2022-11-05 NOTE — Care Management Important Message (Signed)
Important Message  Patient Details  Name: Lindsey Huber MRN: [id number] Date of Birth: Oct 07, 1945   Important Message Given:  Yes - Medicare IM     Dorena Bodo 11/05/2022, 3:16 PM

## 2022-11-05 NOTE — Progress Notes (Signed)
Notified MD patient experiencing blood in stools.

## 2022-11-05 NOTE — Plan of Care (Signed)

## 2022-11-05 NOTE — Progress Notes (Addendum)
PROGRESS NOTE    Lindsey Huber  ZOX:[id number] DOB: [birth year] DOA: 11/02/2022 PCP: Collene Mares, PA   Brief Narrative:  Patient is a 77 yo F w/ A-fib on Xarelto, SSS/PPM, ILD/pulmonary fibrosis/lupus followed by [name], COPD, diastolic CHF, OSA, aortic, mitral, tricuspid valve disease s/p AVR/MV ring/TVR, CKD 3 AA presented to [facility] ED on 10/8 with GIB.   Patient recently admitted on 06/2022 with hematochezia and hemorrhagic shock.  Patient given Kcentra, vitamin K and pressors.CTA showing extravasation at the junction of descending and sigmoid colon and duodenal enteritis. Given 2 units of PRBCs. CTA abd/pelvis w/ extravasation from diverticulum in proximal sigmoid, consistent w/ diverticulr hemorrhage.  Admitted to PCCM, GI consulted.  Further details below.  Assessment & Plan:   Principal Problem:   Acute lower GI bleeding Active Problems:   Essential (primary) hypertension   Acquired hypothyroidism   Long term (current) use of anticoagulants   ABLA (acute blood loss anemia)   Seizure (HCC)  Acute blood loss anemia secondary to lower GI bleed likely secondary to diverticular bleed/ Hemorrhagic shock Patient is on Xarelto and presented with hemoglobin 9.6.  Given Kcentra.  Given vitamin K.  Status post 2 packed red blood cells.  Hemoglobin back to 11.6 then down to 9.5 yesterday.  Was started on heparin after GI clearance with the plan to resume back Xarelto in 3 days.  However due to overdose of heparin leading to high PT, patient had another episode of rectal bleeding which was brief early morning 11/05/2022.  Hemoglobin dropped to 7.4 this morning.  Heparin held for an hour and then resumed at lower dose.  Repeating H&H now.  If less than 7, will need transfusion.  If further episodes of rectal bleeding, will need IR consult for embolization.  GI is following.   Addendum/update: Repeat hemoglobin 7.2.  No indication of transfusion.  Will monitor repeat in 6 to 8 hours  again and transfuse if less than 7.   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   #HFpEF Most recent echo showing ejection fraction of 60 to 65%.  No acute concern for exacerbation at this time.  Will continue to monitor volume status.  Home medication includes furosemide 40 mg daily, metoprolol tartrate 12.5 mg twice daily. -Given softer blood pressures, holding this for now -Once patient has improved blood pressures can resume   # AKI on CKD stage IIIa Baseline creatinine around 1.2 with GFR of 47 but creatinine jumped to 2.12 yesterday.  Likely due to low blood pressure, started on 20 hours of IV fluids at 100 cc/h.  Repeat labs today show improved creatinine at 1.3 which is her baseline.  No further IV fluids at this point in time.     #History of lupus Currently on prednisone 5 mg daily at home and hydroxychloroquine 200 mg daily. -Resume home prednisone and hydroxychloroquine   #History of seizures Patient is on Keppra 500 mg twice daily at home.  No acute concerns at this time for seizures. -Continue home Keppra 500 mg twice daily  DVT prophylaxis: SCDs Start: 11/02/22 2023   Code Status: Full Code  Family Communication:  None present at bedside.  Plan of care discussed with patient in length and he/she verbalized understanding and agreed with it.  Status is: Inpatient Remains inpatient appropriate because: Needs further monitoring, advancing diet.  On IV heparin.   Estimated body mass index is 22.46  kg/m as calculated from the following:   Height as of this encounter: 5\' 3"  (1.6 m).   Weight as of this encounter: 57.5 kg.    Nutritional Assessment: Body mass index is 22.46 kg/m.Marland Kitchen Seen by dietician.  I agree with the assessment and plan as outlined below: Nutrition Status:        . Skin Assessment: I have examined the patient's skin and I agree  with the wound assessment as performed by the wound care RN as outlined below:    Consultants:  GI  Procedures:  None  Antimicrobials:  Anti-infectives (From admission, onward)    Start     Dose/Rate Route Frequency Ordered Stop   11/03/22 1330  hydroxychloroquine (PLAQUENIL) tablet 200 mg        200 mg Oral Daily 11/03/22 1235           Subjective: Seen and examined.  Feeling much better.  Had 1 episode of rectal bleeding early morning today.  No complaints.  Objective: Vitals:   11/04/22 1635 11/04/22 2147 11/05/22 0500 11/05/22 0923  BP: (!) 109/50 (!) 103/52  (!) 110/48  Pulse: 73 75  69  Resp: 18   18  Temp: 98.6 F (37 C) 98.4 F (36.9 C)  98.6 F (37 C)  TempSrc:  Oral    SpO2: 99% 99%  100%  Weight:   57.5 kg   Height:        Intake/Output Summary (Last 24 hours) at 11/05/2022 1231 Last data filed at 11/05/2022 0847 Gross per 24 hour  Intake 1935.54 ml  Output 700 ml  Net 1235.54 ml   Filed Weights   11/03/22 0437 11/04/22 0418 11/05/22 0500  Weight: 54.7 kg 54.7 kg 57.5 kg    Examination:  General exam: Appears calm and comfortable  Respiratory system: Clear to auscultation. Respiratory effort normal. Cardiovascular system: S1 & S2 heard, RRR. No JVD, murmurs, rubs, gallops or clicks. No pedal edema. Gastrointestinal system: Abdomen is nondistended, soft and nontender. No organomegaly or masses felt. Normal bowel sounds heard. Central nervous system: Alert and oriented. No focal neurological deficits. Extremities: Symmetric 5 x 5 power. Skin: No rashes, lesions or ulcers.  Psychiatry: Judgement and insight appear normal. Mood & affect appropriate.   Data Reviewed: I have personally reviewed following labs and imaging studies  CBC: Recent Labs  Lab 11/02/22 1523 11/03/22 0031 11/03/22 0222 11/04/22 0307 11/05/22 0526  WBC 11.1*  --  10.2  --  7.4  NEUTROABS 7.7  --   --   --   --   HGB 9.6* 11.6* 11.4* 9.5* 7.4*  HCT 30.1* 33.6* 33.1*  27.3* 22.1*  MCV 102.4*  --  91.7  --  96.5  PLT 186  --  141*  --  115*   Basic Metabolic Panel: Recent Labs  Lab 11/02/22 1523 11/03/22 0222 11/04/22 0307  NA 137 140 131*  K 3.5 4.2 3.7  CL 103 102 95*  CO2 26 23 25   GLUCOSE 142* 102* 114*  BUN 26* 31* 38*  CREATININE 1.33* 1.20* 2.12*  CALCIUM 8.6* 8.7* 8.1*   GFR: Estimated Creatinine Clearance: 19 mL/min (A) (by C-G formula based on SCr of 2.12 mg/dL (H)). Liver Function Tests: Recent Labs  Lab 11/02/22 1523  AST 26  ALT 20  ALKPHOS 71  BILITOT 0.6  PROT 6.5  ALBUMIN 2.7*   No results for input(s): "LIPASE", "AMYLASE" in the last 168 hours. No results for input(s): "AMMONIA" in the last 168  hours. Coagulation Profile: No results for input(s): "INR", "PROTIME" in the last 168 hours. Cardiac Enzymes: No results for input(s): "CKTOTAL", "CKMB", "CKMBINDEX", "TROPONINI" in the last 168 hours. BNP (last 3 results) No results for input(s): "PROBNP" in the last 8760 hours. HbA1C: No results for input(s): "HGBA1C" in the last 72 hours. CBG: Recent Labs  Lab 11/03/22 1128 11/03/22 1925 11/03/22 2320 11/04/22 0332 11/04/22 0753  GLUCAP 87 180* 114* 112* 109*   Lipid Profile: No results for input(s): "CHOL", "HDL", "LDLCALC", "TRIG", "CHOLHDL", "LDLDIRECT" in the last 72 hours. Thyroid Function Tests: No results for input(s): "TSH", "T4TOTAL", "FREET4", "T3FREE", "THYROIDAB" in the last 72 hours. Anemia Panel: No results for input(s): "VITAMINB12", "FOLATE", "FERRITIN", "TIBC", "IRON", "RETICCTPCT" in the last 72 hours. Sepsis Labs: Recent Labs  Lab 11/02/22 1644  LATICACIDVEN 2.0*    Recent Results (from the past 240 hour(s))  MRSA Next Gen by PCR, Nasal     Status: None   Collection Time: 11/02/22 10:12 PM   Specimen: Nasal Mucosa; Nasal Swab  Result Value Ref Range Status   MRSA by PCR Next Gen NOT DETECTED NOT DETECTED Final    Comment: (NOTE) The GeneXpert MRSA Assay (FDA approved for NASAL  specimens only), is one component of a comprehensive MRSA colonization surveillance program. It is not intended to diagnose MRSA infection nor to guide or monitor treatment for MRSA infections. Test performance is not FDA approved in patients less than 79 years old. Performed at [facility], [address], [place], [place] [address]      Radiology Studies: No results found.  Scheduled Meds:  sodium chloride   Intravenous Once   allopurinol  100 mg Oral Daily   Chlorhexidine Gluconate Cloth  6 each Topical Daily   fluticasone furoate-vilanterol  1 puff Inhalation Daily   And   umeclidinium bromide  1 puff Inhalation Daily   hydroxychloroquine  200 mg Oral Daily   levETIRAcetam  500 mg Oral BID   levothyroxine  25 mcg Oral Daily   pantoprazole (PROTONIX) IV  40 mg Intravenous Q12H   predniSONE  5 mg Oral Q breakfast   Continuous Infusions:  heparin 650 Units/hr (11/05/22 0847)     LOS: 3 days   Hughie Closs, MD [facility]  11/05/2022, 12:31 PM   *Please note that this is a verbal dictation therefore any spelling or grammatical errors are due to the "Dragon Medical One" system interpretation.  Please page via Amion and do not message via secure chat for urgent patient care matters. Secure chat can be used for non urgent patient care matters.  How to contact the [facility] Attending or Consulting provider 7A - 7P or covering provider during after hours 7P -7A, for this patient?  Check the care team in [facility] and look for a) attending/consulting TRH provider listed and b) the [facility] team listed. Page or secure chat 7A-7P. Log into [email or web address] and use [facility]'s universal password to access. If you do not have the password, please contact the hospital operator. Locate the [facility] provider you are looking for under [facility] and page to a number that you can be directly reached. If you still have difficulty reaching the provider, please page the [facility] (Director on  Call) for the Hospitalists listed on amion for assistance.

## 2022-11-05 NOTE — Progress Notes (Addendum)
  Patient's bedside nurse reported that noticing small amount of stool dark in color.  I believe it is the remanent of blood from previous diverticular bleeding that that mixing with the stool.  Per chart review patient has been admitted for acute blood loss anemia secondary to lower GI bleed secondary to diverticular bleed. Patient has been seen and cleared by GI earlier for resumption of heparin drip and later on can resume Xarelto in 3 days. Patient has history of atrial fibrillation currently on heparin drip which has been initiated after clearance from GI - Continue to monitor for overt GI bleed with fresh blood. - Continue to monitor H&H. -Planning to continue heparin drip for now for management of atrial fibrillation as benefit overweigh risk to prevent stroke. However if patient develops overt GI bleed in that case we will need to hold anticoagulation.  Tereasa Coop, MD [facility] 11/05/2022, 1:40 AM

## 2022-11-05 NOTE — Progress Notes (Signed)
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   Allergies  Allergen Reactions   Other Other (See Comments)    Patient is to NOT EAT any foods with husks or tree nuts, popcorn   Propofol Shortness Of Breath and Other (See Comments)    "Caused asthma"   Flecainide Other (See Comments)    Reaction not recalled   Amiodarone Other (See Comments)    Delirium/Confusion/Psychosis   Amlodipine Other (See Comments)    "Tired and syncope"    Patient Measurements: Height: 5\' 3"  (160 cm) Weight: 57.5 kg (126 lb 12.2 oz) IBW/kg (Calculated) : 52.4 Heparin Dosing Weight: 54.7 kg  Vital Signs: Temp: 98.4 F (36.9 C) (10/10 2147) Temp Source: Oral (10/10 2147) BP: 103/52 (10/10 2147) Pulse Rate: 75 (10/10 2147)  Labs: Recent Labs    11/02/22 1523 11/03/22 0031 11/03/22 0222 11/04/22 0307 11/04/22 2013 11/05/22 0526  HGB 9.6*   < > 11.4* 9.5*  --  7.4*  HCT 30.1*   < > 33.1* 27.3*  --  22.1*  PLT 186  --  141*  --   --  115*  APTT  --   --   --   --  33 >200*  HEPARINUNFRC  --   --   --   --  0.37 0.99*  CREATININE 1.33*  --  1.20* 2.12*  --   --    < > = values in this interval not displayed.    Estimated Creatinine Clearance: 19 mL/min (A) (by C-G formula based on SCr of 2.12 mg/dL (H)).   Medical History: Past Medical History:  Diagnosis Date   Achalasia    Acquired hypothyroidism 08/20/2020   Acute metabolic encephalopathy 07/25/2021   AICD (automatic cardioverter/defibrillator) present 11/14/2019   Allergic rhinitis    Ankle fracture 03/25/2022   Aortic valve disorder 03/26/2002   Atherosclerosis of abdominal aorta (HCC) 05/01/2020   Cardiomyopathy (HCC)    CHB (complete heart block) (HCC) 08/18/2017   CHF (congestive heart failure) (HCC)    Cholelithiasis without obstruction 05/01/2020   Chronic gouty arthritis    Chronic heart failure with preserved  ejection fraction (HCC) 05/01/2020   Chronic kidney disease (CKD), active medical management without dialysis, stage 3 (moderate) (HCC) 05/01/2020   Closed torus fracture of distal end of right radius with delayed healing 08/12/2021   Delusional thoughts (HCC)    Diverticulosis of colon 05/01/2020   Epistaxis    Essential (primary) hypertension 08/18/2017   Gastroesophageal reflux disease 05/01/2020   Gastrointestinal hemorrhage    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 11/14/2019   Hypercoagulability due to atrial fibrillation (HCC) 05/01/2020   Hyperlipidemia 11/14/2019   Hypocalcemia 12/14/2020   Hypoglycemia    Hypokalemia 12/14/2020   Hypomagnesemia 12/14/2020   Hypotension 12/14/2020   Idiopathic pulmonary fibrosis (HCC) 05/01/2020   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   Iron deficiency anemia    Long term (current) use of anticoagulants    Malnutrition of mild degree [name]: 75% to less than 90% of standard weight) (HCC)    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/12/2021   Mitral and aortic incompetence 11/14/2019   Non-rheumatic atrial fibrillation (HCC) 05/01/2020   Non-toxic multinodular goiter 08/20/2020   NSVT (nonsustained ventricular tachycardia) (HCC) 08/18/2017   Oropharyngeal dysphagia    Osteoarthritis of hip 05/01/2020   Osteoarthritis of knee 05/01/2020   Osteopenia of neck of left femur    Pain of left  hip joint 12/12/2017   Proteinuria 05/01/2020   Raynaud's disease    Recurrent falls 04/09/2021   Rheumatoid arthritis (HCC)    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   Septic shock (HCC) 03/10/2020   Slow transit constipation    Systemic lupus erythematosus (HCC) 05/01/2020   Thrombophilia (HCC)    Transient ischemic attack    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   Vitamin D deficiency     Medications:  Scheduled:   sodium chloride   Intravenous Once   allopurinol  100 mg Oral Daily    Chlorhexidine Gluconate Cloth  6 each Topical Daily   fluticasone furoate-vilanterol  1 puff Inhalation Daily   And   umeclidinium bromide  1 puff Inhalation Daily   hydroxychloroquine  200 mg Oral Daily   levETIRAcetam  500 mg Oral BID   levothyroxine  25 mcg Oral Daily   pantoprazole (PROTONIX) IV  40 mg Intravenous Q12H   predniSONE  5 mg Oral Q breakfast   Infusions:   sodium chloride 100 mL/hr at 11/05/22 0653   heparin 750 Units/hr (11/05/22 1610)    Assessment: 77 years of age female with history of multi-vavular disease with bioprosthetic valves and atrial fibrillation on Xarelto prior to admission who presented with diverticular bleed. Xarelto was held (last dose 10/7) and Kcentra 25 units/kg and 1 unit PRBC given on 10/8 PM. No further bleeding observed. Hgb down to 9.5. Hemodynamically stable. GI consulted and approved resuming anticoagulation with IV Heparin. No boluses. If tolerates without further bleeding, plan to resume Xarelto in ~3 days. Will check both aPTT and anti-Xa levels to assess if recent Xarelto is still affecting anti-Xa levels.   Heparin level 0.37, but aPTT only 33.  Appears heparin level still being influenced by recent Xarelto use.  Will increase heparin infusion.  10/11 AM update: aPTT >200 (confirmed drawn from opposite limb that heparin is running and confirmed rate was correct at 7.5 ml/hr) HL 0.99 Hgb dropped from 9.5 >> 7.4 in ~24 hours- probably dilutional- has IVF at 100 cc/hr running since ~1300 on 10/10 Patient had blood in stool described as small amounts of dark stool reported from overnight staff (on-call MD was alerted) This is probably remnant blood from previous bleed. She was admitted for acute blood loss anemia 2/2 lower GI bleed 2/2 diverticular bleed. Previously cleared by GI during this visit for resumption of heparin gtt and can resume Xarelto in 3 days.    Goal of Therapy:  Heparin level 0.3-0.7 units/ml aPTT 66-102 Monitor  platelets by anticoagulation protocol: Yes   Plan:  Hold heparin infusion x1 hour Decrease Heparin to 650 units/hr.  aPTT and anti-Xa level in 8 hours.  The above changes have been communicated to the attending Daily aPTT and anti-Xa levels until correlating.  Monitor closely for signs and symptoms of bleeding and CBC.    Greta Doom BS, PharmD, BCPS Clinical Pharmacist 11/05/2022 7:09 AM  Contact: [phone] after 3 PM  "Be curious, not judgmental..." -Debbora Dus

## 2022-11-05 NOTE — TOC CM/SW Note (Addendum)
Transition of Care [facility]) - Inpatient Brief Assessment   Patient Details  Name: Lindsey Huber MRN: [id number] Date of Birth: 11/27/1945  Transition of Care [facility]) CM/SW Contact:    Tom-Johnson, Hershal Coria, RN Phone Number: 11/05/2022, 10:58 AM   Clinical Narrative:  Patient presented to the ED with c/o dark red bloody stool with clots. Hgb dropped from 12.2-9.6. Kcentra, Vitamin K and 2U PRBC was given. Hgb today at 7.4.  CT Abd/Pelvis consistent with Diverticular Hemorrhage.   Patient has Hx of A-fib, Aortic, Mitral, Tricuspid Valve Disease s/p AVR/MV ring/TVR (on Xarelto) ILD/Pulmonary Fibrosis, Lupus, COPD, Diastolic CHF, OSA, CKD 3.   Patient currently on Heparin gtt, to resume Xarelto in 3 days. IR and GI following.  From home with daughter and grand daughter. Has two supportive children. Does not drive, family transports to and from appointments. Has all necessary DME's at home.  PCP is [place], [place], [name] and uses [facility] on [address] in [place].    Patient active with [place] for Home Health PT/OT disciplines. Resumption of care referral called in to [facility] with acceptance voiced, info on AVS.   Patient not Medically ready for discharge.  CM will continue to follow as patient progresses with care towards discharge.      Transition of Care Asessment: Insurance and Status: Insurance coverage has been reviewed Patient has primary care physician: Yes Home environment has been reviewed: Yes Prior level of function:: Modified Independent Prior/Current Home Services: Current home services Social Determinants of Health Reivew: SDOH reviewed no interventions necessary Readmission risk has been reviewed: Yes Transition of care needs: transition of care needs identified, TOC will continue to follow

## 2022-11-05 NOTE — Progress Notes (Signed)
Lindsey Huber 11:43 AM  Subjective: Patient seen and examined and her hospital computer chart reviewed and her case discussed with my partner and she has not bled since 1 AM she has no pain and is tolerating clear liquids and has no other complaints  Objective: Vital signs stable afebrile no acute distress abdomen is soft nontender BUN and creatinine slight increase globin drop with over dose of heparin  Assessment: GI blood loss repeat bleeding secondary to increased heparin  Plan: If no further bleeding may slowly advance diet tomorrow I will check on tomorrow and call me sooner as needed and certainly if bleeding increases would consult IR for consideration of coils stopping bleeding  Lyndsy Gilberto Huber  office [phone] After 5PM or if no answer call [phone]

## 2022-11-06 DIAGNOSIS — K922 Gastrointestinal hemorrhage, unspecified: Secondary | ICD-10-CM | POA: Diagnosis not present

## 2022-11-06 LAB — TYPE AND SCREEN
ABO/RH(D): A NEG
Antibody Screen: POSITIVE
DAT, IgG: POSITIVE
Unit division: 0
Unit division: 0
Unit division: 0

## 2022-11-06 LAB — BPAM RBC
Blood Product Expiration Date: 202410182359
Blood Product Expiration Date: 202410232359
Blood Product Expiration Date: 202410232359
Blood Product Expiration Date: 202410282359
ISSUE DATE / TIME: 202410081700
ISSUE DATE / TIME: 202410081700
Unit Type and Rh: 600
Unit Type and Rh: 600
Unit Type and Rh: 9500
Unit Type and Rh: 9500

## 2022-11-06 LAB — CBC
HCT: 20.7 % — ABNORMAL LOW (ref 36.0–46.0)
Hemoglobin: 6.7 g/dL — CL (ref 12.0–15.0)
MCH: 31 pg (ref 26.0–34.0)
MCHC: 32.4 g/dL (ref 30.0–36.0)
MCV: 95.8 fL (ref 80.0–100.0)
Platelets: 123 10*3/uL — ABNORMAL LOW (ref 150–400)
RBC: 2.16 MIL/uL — ABNORMAL LOW (ref 3.87–5.11)
RDW: 15 % (ref 11.5–15.5)
WBC: 6.7 10*3/uL (ref 4.0–10.5)
nRBC: 0.6 % — ABNORMAL HIGH (ref 0.0–0.2)

## 2022-11-06 LAB — HEPARIN LEVEL (UNFRACTIONATED): Heparin Unfractionated: 0.69 [IU]/mL (ref 0.30–0.70)

## 2022-11-06 LAB — HEMOGLOBIN AND HEMATOCRIT, BLOOD
HCT: 21.4 % — ABNORMAL LOW (ref 36.0–46.0)
HCT: 24.4 % — ABNORMAL LOW (ref 36.0–46.0)
Hemoglobin: 7.2 g/dL — ABNORMAL LOW (ref 12.0–15.0)
Hemoglobin: 8.2 g/dL — ABNORMAL LOW (ref 12.0–15.0)

## 2022-11-06 LAB — PREPARE RBC (CROSSMATCH)

## 2022-11-06 MED ORDER — SODIUM CHLORIDE 0.9% IV SOLUTION: Freq: Once | INTRAVENOUS | Status: AC

## 2022-11-06 NOTE — Progress Notes (Signed)
Date and time results received: 11/06/22 0411 (use smartphrase ".now" to insert current time)  Test: Hgb Critical Value: 6.7  Name of Provider Notified: Tereasa Coop, MD  Orders Received? Or Actions Taken?: heparin infusion stopped. Order received for blood transfusion.

## 2022-11-06 NOTE — Plan of Care (Signed)
  Problem: Education: Goal: Knowledge of General Education information will improve Description Including pain rating scale, medication(s)/side effects and non-pharmacologic comfort measures Outcome: Progressing   Problem: Health Behavior/Discharge Planning: Goal: Ability to manage health-related needs will improve Outcome: Progressing   

## 2022-11-06 NOTE — Progress Notes (Signed)
PROGRESS NOTE    Lindsey Huber  NAT:[id number] DOB: 03/25/1945 DOA: 11/02/2022 PCP: Collene Mares, PA   Brief Narrative:  Patient is a 77 yo F w/ A-fib on Xarelto, SSS/PPM, ILD/pulmonary fibrosis/lupus followed by [name], COPD, diastolic CHF, OSA, aortic, mitral, tricuspid valve disease s/p AVR/MV ring/TVR, CKD 3 AA presented to [facility] ED on 10/8 with GIB.   Patient recently admitted on 06/2022 with hematochezia and hemorrhagic shock.  Patient given Kcentra, vitamin K and pressors.CTA showing extravasation at the junction of descending and sigmoid colon and duodenal enteritis. Given 2 units of PRBCs. CTA abd/pelvis w/ extravasation from diverticulum in proximal sigmoid, consistent w/ diverticulr hemorrhage.  Admitted to PCCM, GI consulted.  Further details below.  Assessment & Plan:   Principal Problem:   Acute lower GI bleeding Active Problems:   Essential (primary) hypertension   Acquired hypothyroidism   Long term (current) use of anticoagulants   ABLA (acute blood loss anemia)   Seizure (HCC)  Acute blood loss anemia secondary to lower GI bleed likely secondary to diverticular bleed/ Hemorrhagic shock Patient is on Xarelto and presented with hemoglobin 9.6.  Given Kcentra.  Given vitamin K.  Status post 2 packed red blood cells.  Hemoglobin back to 11.6 then down to 9.5 yesterday.  Was started on heparin after GI clearance on 11/04/2022 with the plan to resume back Xarelto in 3 days.  However due to overdose of heparin leading to high PT, patient had another episode of rectal bleeding which was brief early morning 11/05/2022.  Hemoglobin dropped to 7.4, heparin held for an hour and resumed again.  Patient had 1-2 black stools but no bright red blood per rectum.  Hemoglobin dropped to 6.7 this morning.  Heparin is stopped by night hospitalist, which I fully agree with.  1 unit of transfusion is ordered which is still pending.  We will check hemoglobin posttransfusion and hold  heparin for 24 hours.  Awaiting GI to see her to provide further recommendations.   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   #HFpEF Most recent echo showing ejection fraction of 60 to 65%.  No acute concern for exacerbation at this time.  Will continue to monitor volume status.  Home medication includes furosemide 40 mg daily, metoprolol tartrate 12.5 mg twice daily. -Given softer blood pressures, holding this for now -Once patient has improved blood pressures can resume   # AKI on CKD stage IIIa Baseline creatinine around 1.2 with GFR of 47 but creatinine jumped to 2.12 11/04/2022, improved to 1.3 after 1 L of IV fluid.   #History of lupus Currently on prednisone 5 mg daily at home and hydroxychloroquine 200 mg daily. -Resume home prednisone and hydroxychloroquine   #History of seizures Patient is on Keppra 500 mg twice daily at home.  No acute concerns at this time for seizures. -Continue home Keppra 500 mg twice daily  DVT prophylaxis: SCDs Start: 11/02/22 2023   Code Status: Full Code  Family Communication:  None present at bedside.  Plan of care discussed with patient in length and he/she verbalized understanding and agreed with it.  Status is: Inpatient Remains inpatient appropriate because: Needs further monitoring, may need embolization for lower GI bleeding by IR.  Estimated body mass index is 21.54 kg/m as calculated from the following:   Height as of this encounter: 5\' 3"  (1.6 m).   Weight as of this encounter: 55.2 kg.  Nutritional Assessment: Body mass index is 21.54 kg/m.Marland Kitchen Seen by dietician.  I agree with the assessment and plan as outlined below: Nutrition Status:        . Skin Assessment: I have examined the patient's skin and I agree with the wound assessment as performed by the wound care RN as outlined below:    Consultants:   GI  Procedures:  None  Antimicrobials:  Anti-infectives (From admission, onward)    Start     Dose/Rate Route Frequency Ordered Stop   11/03/22 1330  hydroxychloroquine (PLAQUENIL) tablet 200 mg        200 mg Oral Daily 11/03/22 1235           Subjective: Patient seen and examined.  She has no complaints.  She confirmed black stools but denied having any bright red blood per rectum.  Objective: Vitals:   11/05/22 2154 11/06/22 0449 11/06/22 0500 11/06/22 0947  BP: (!) 125/59 (!) 111/58  (!) 108/55  Pulse: 72 70  70  Resp:      Temp: 97.6 F (36.4 C) 97.8 F (36.6 C)  98.5 F (36.9 C)  TempSrc: Oral Oral    SpO2: 98% 100%  99%  Weight:   55.2 kg   Height:        Intake/Output Summary (Last 24 hours) at 11/06/2022 1128 Last data filed at 11/06/2022 1040 Gross per 24 hour  Intake 917.71 ml  Output 0 ml  Net 917.71 ml   Filed Weights   11/04/22 0418 11/05/22 0500 11/06/22 0500  Weight: 54.7 kg 57.5 kg 55.2 kg    Examination:  General exam: Appears calm and comfortable  Respiratory system: Clear to auscultation. Respiratory effort normal. Cardiovascular system: S1 & S2 heard, RRR. No JVD, murmurs, rubs, gallops or clicks. No pedal edema. Gastrointestinal system: Abdomen is nondistended, soft and nontender. No organomegaly or masses felt. Normal bowel sounds heard. Central nervous system: Alert and oriented. No focal neurological deficits. Extremities: Symmetric 5 x 5 power. Skin: No rashes, lesions or ulcers.  Psychiatry: Judgement and insight appear normal. Mood & affect appropriate.    Data Reviewed: I have personally reviewed following labs and imaging studies  CBC: Recent Labs  Lab 11/02/22 1523 11/03/22 0031 11/03/22 0222 11/04/22 0307 11/05/22 0526 11/05/22 1150 11/06/22 0338 11/06/22 0914  WBC 11.1*  --  10.2  --  7.4  --  6.7  --   NEUTROABS 7.7  --   --   --   --   --   --   --   HGB 9.6*   < > 11.4* 9.5* 7.4* 7.2* 6.7* 7.2*  HCT 30.1*    < > 33.1* 27.3* 22.1* 21.6* 20.7* 21.4*  MCV 102.4*  --  91.7  --  96.5  --  95.8  --   PLT 186  --  141*  --  115*  --  123*  --    < > = values in this interval not displayed.   Basic Metabolic Panel: Recent Labs  Lab 11/02/22 1523 11/03/22 0222 11/04/22 0307 11/05/22 1150  NA 137 140 131* 132*  K 3.5 4.2 3.7 3.7  CL 103 102 95* 99  CO2 26 23 25 23   GLUCOSE 142* 102* 114* 97  BUN 26* 31* 38* 29*  CREATININE 1.33* 1.20* 2.12* 1.34*  CALCIUM 8.6* 8.7* 8.1* 8.2*   GFR: Estimated Creatinine Clearance: 30 mL/min (A) (by C-G formula based on SCr of 1.34 mg/dL (H)). Liver Function Tests: Recent Labs  Lab 11/02/22 1523  AST 26  ALT 20  ALKPHOS 71  BILITOT 0.6  PROT 6.5  ALBUMIN 2.7*   No results for input(s): "LIPASE", "AMYLASE" in the last 168 hours. No results for input(s): "AMMONIA" in the last 168 hours. Coagulation Profile: No results for input(s): "INR", "PROTIME" in the last 168 hours. Cardiac Enzymes: No results for input(s): "CKTOTAL", "CKMB", "CKMBINDEX", "TROPONINI" in the last 168 hours. BNP (last 3 results) No results for input(s): "PROBNP" in the last 8760 hours. HbA1C: No results for input(s): "HGBA1C" in the last 72 hours. CBG: Recent Labs  Lab 11/03/22 1128 11/03/22 1925 11/03/22 2320 11/04/22 0332 11/04/22 0753  GLUCAP 87 180* 114* 112* 109*   Lipid Profile: No results for input(s): "CHOL", "HDL", "LDLCALC", "TRIG", "CHOLHDL", "LDLDIRECT" in the last 72 hours. Thyroid Function Tests: No results for input(s): "TSH", "T4TOTAL", "FREET4", "T3FREE", "THYROIDAB" in the last 72 hours. Anemia Panel: No results for input(s): "VITAMINB12", "FOLATE", "FERRITIN", "TIBC", "IRON", "RETICCTPCT" in the last 72 hours. Sepsis Labs: Recent Labs  Lab 11/02/22 1644  LATICACIDVEN 2.0*    Recent Results (from the past 240 hour(s))  MRSA Next Gen by PCR, Nasal     Status: None   Collection Time: 11/02/22 10:12 PM   Specimen: Nasal Mucosa; Nasal Swab   Result Value Ref Range Status   MRSA by PCR Next Gen NOT DETECTED NOT DETECTED Final    Comment: (NOTE) The GeneXpert MRSA Assay (FDA approved for NASAL specimens only), is one component of a comprehensive MRSA colonization surveillance program. It is not intended to diagnose MRSA infection nor to guide or monitor treatment for MRSA infections. Test performance is not FDA approved in patients less than 69 years old. Performed at [facility], [address] [place], [place] [address]      Radiology Studies: No results found.  Scheduled Meds:  allopurinol  100 mg Oral Daily   Chlorhexidine Gluconate Cloth  6 each Topical Daily   fluticasone furoate-vilanterol  1 puff Inhalation Daily   And   umeclidinium bromide  1 puff Inhalation Daily   hydroxychloroquine  200 mg Oral Daily   levETIRAcetam  500 mg Oral BID   levothyroxine  25 mcg Oral Daily   pantoprazole (PROTONIX) IV  40 mg Intravenous Q12H   predniSONE  5 mg Oral Q breakfast   Continuous Infusions:     LOS: 4 days   Hughie Closs, MD [facility]  11/06/2022, 11:28 AM   *Please note that this is a verbal dictation therefore any spelling or grammatical errors are due to the "Dragon Medical One" system interpretation.  Please page via Amion and do not message via secure chat for urgent patient care matters. Secure chat can be used for non urgent patient care matters.  How to contact the [facility] Attending or Consulting provider 7A - 7P or covering provider during after hours 7P -7A, for this patient?  Check the care team in [facility] and look for a) attending/consulting TRH provider listed and b) the [facility] team listed. Page or secure chat 7A-7P. Log into [email or web address] and use [place]'s universal password to access. If you do not have the password, please contact the hospital operator. Locate the [facility] provider you are looking for under [facility] and page to a number that you can be directly reached. If you  still have difficulty reaching the provider, please page the [facility] (Director on Call) for the Hospitalists listed on amion for assistance.

## 2022-11-06 NOTE — Progress Notes (Signed)
Lindsey Huber 12:37 PM  Subjective: Patient with a little bleeding last night while on heparin however none since seemingly resolved with turning off her heparin no other new complaints and tolerating her diet  Objective: Vital signs stable afebrile no acute distress abdomen is soft nontender hemoglobin slight drop BUN and creatinine actually decreased  Assessment: Diverticular bleed made worse with heparin  Plan: Care with restarting anticoagulation might consider Lovenox or subcu dosing or even the lower heparin dose will check on tomorrow call me sooner if any specific GI question or problem or signs of increased active bleeding  Lindsey Huber  office [phone] After 5PM or if no answer call [phone]

## 2022-11-06 NOTE — Plan of Care (Signed)
Problem: Education: Goal: Knowledge of General Education information will improve Description: Including pain rating scale, medication(s)/side effects and non-pharmacologic comfort measures Outcome: Progressing   Problem: Health Behavior/Discharge Planning: Goal: Ability to manage health-related needs will improve Outcome: Progressing   Problem: Clinical Measurements: Goal: Will remain free from infection Outcome: Progressing Goal: Respiratory complications will improve Outcome: Progressing Goal: Cardiovascular complication will be avoided Outcome: Progressing   Problem: Coping: Goal: Level of anxiety will decrease Outcome: Progressing   Problem: Elimination: Goal: Will not experience complications related to bowel motility Outcome: Progressing Goal: Will not experience complications related to urinary retention Outcome: Progressing   Problem: Pain Managment: Goal: General experience of comfort will improve Outcome: Progressing   Problem: Safety: Goal: Ability to remain free from injury will improve Outcome: Progressing   Problem: Skin Integrity: Goal: Risk for impaired skin integrity will decrease Outcome: Progressing

## 2022-11-06 NOTE — Progress Notes (Signed)
Recurrent GI bleed: Acute blood loss anemia secondary to lower GI bleed likely secondary to diverticular bleed  Patient nurse reporting that patient hemoglobin is low 6.7.  Patient is also on heparin drip for paroxysmal atrial fibrillation.  Per chart review patient has been admitted for acute blood loss anemia due to diverticular bleeding and initially had hemorrhagic shock.   Bedside nurse reported patient has bowel movement that is mixed with blood around 1:30 PM 11/05/2022.  She did not have any recurrent episode of bloody bowel movement later tonight.  -Recommending to hold the heparin drip - Transfusing 1 unit of blood. - Need to inform GI in the morning about recurrent development of diverticular bleed.  Tereasa Coop, MD [facility] 11/06/2022, 4:27 AM

## 2022-11-07 DIAGNOSIS — K922 Gastrointestinal hemorrhage, unspecified: Secondary | ICD-10-CM | POA: Diagnosis not present

## 2022-11-07 LAB — CBC
HCT: 23.7 % — ABNORMAL LOW (ref 36.0–46.0)
HCT: 24.1 % — ABNORMAL LOW (ref 36.0–46.0)
Hemoglobin: 8.1 g/dL — ABNORMAL LOW (ref 12.0–15.0)
Hemoglobin: 8.3 g/dL — ABNORMAL LOW (ref 12.0–15.0)
MCH: 32.4 pg (ref 26.0–34.0)
MCH: 32.7 pg (ref 26.0–34.0)
MCHC: 34.2 g/dL (ref 30.0–36.0)
MCHC: 34.4 g/dL (ref 30.0–36.0)
MCV: 94.8 fL (ref 80.0–100.0)
MCV: 94.9 fL (ref 80.0–100.0)
Platelets: 118 10*3/uL — ABNORMAL LOW (ref 150–400)
Platelets: 125 10*3/uL — ABNORMAL LOW (ref 150–400)
RBC: 2.5 MIL/uL — ABNORMAL LOW (ref 3.87–5.11)
RBC: 2.54 MIL/uL — ABNORMAL LOW (ref 3.87–5.11)
RDW: 17.3 % — ABNORMAL HIGH (ref 11.5–15.5)
RDW: 17.2 % — ABNORMAL HIGH (ref 11.5–15.5)
WBC: 8.3 10*3/uL (ref 4.0–10.5)
WBC: 7.7 10*3/uL (ref 4.0–10.5)
nRBC: 1 % — ABNORMAL HIGH (ref 0.0–0.2)
nRBC: 0.6 % — ABNORMAL HIGH (ref 0.0–0.2)

## 2022-11-07 NOTE — Progress Notes (Signed)
PROGRESS NOTE    Lindsey Huber  JYN:[id number] DOB: 03-07-1945 DOA: 11/02/2022 PCP: Collene Mares, PA   Brief Narrative:  Patient is a 77 yo F w/ A-fib on Xarelto, SSS/PPM, ILD/pulmonary fibrosis/lupus followed by [name], COPD, diastolic CHF, OSA, aortic, mitral, tricuspid valve disease s/p AVR/MV ring/TVR, CKD 3 AA presented to [facility] ED on 10/8 with GIB.   Patient recently admitted on 06/2022 with hematochezia and hemorrhagic shock.  Patient given Kcentra, vitamin K and pressors.CTA showing extravasation at the junction of descending and sigmoid colon and duodenal enteritis. Given 2 units of PRBCs. CTA abd/pelvis w/ extravasation from diverticulum in proximal sigmoid, consistent w/ diverticulr hemorrhage.  Admitted to PCCM, GI consulted.  Further details below.  Assessment & Plan:   Principal Problem:   Acute lower GI bleeding Active Problems:   Essential (primary) hypertension   Acquired hypothyroidism   Long term (current) use of anticoagulants   ABLA (acute blood loss anemia)   Seizure (HCC)  Acute blood loss anemia secondary to lower GI bleed likely secondary to diverticular bleed/ Hemorrhagic shock Patient is on Xarelto and presented with hemoglobin 9.6.  Given Kcentra.  Given vitamin K.  Status post 2 packed red blood cells.  Hemoglobin back to 11.6 then down to 9.5 yesterday.  Was started on heparin after GI clearance on 11/04/2022 with the plan to resume back Xarelto in 3 days.  However due to overdose of heparin leading to high PT, patient had another episode of rectal bleeding which was brief early morning 11/05/2022.  Hemoglobin dropped to 7.4, heparin held for an hour and resumed again.  Patient had 1-2 black stools but no bright red blood per rectum.  Hemoglobin dropped to 6.7 on the morning of 11/06/2022, heparin stopped, 1 unit of transfusion given.  Patient has not had any bowel movement the last 24 hours.  Hemoglobin has remained stable.  However I think it would  be safer to hold heparin for another 24 hours and see how her bowel movements are.  Patient and her daughter who is at the bedside today are in agreement with this plan.    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   #HFpEF Most recent echo showing ejection fraction of 60 to 65%.  No acute concern for exacerbation at this time.  Will continue to monitor volume status.  Home medication includes furosemide 40 mg daily, metoprolol tartrate 12.5 mg twice daily. -Given softer blood pressures, holding this for now -Once patient has improved blood pressures can resume   # AKI on CKD stage IIIa Baseline creatinine around 1.2 with GFR of 47 but creatinine jumped to 2.12 11/04/2022, improved to 1.3 after 1 L of IV fluid.   #History of lupus Currently on prednisone 5 mg daily at home and hydroxychloroquine 200 mg daily. -Resume home prednisone and hydroxychloroquine   #History of seizures Patient is on Keppra 500 mg twice daily at home.  No acute concerns at this time for seizures. -Continue home Keppra 500 mg twice daily  DVT prophylaxis: SCDs Start: 11/02/22 2023   Code Status: Full Code  Family Communication: Daughter present at bedside.  Plan of care discussed with patient in length and he/she verbalized understanding and agreed with it.  Status is: Inpatient Remains inpatient appropriate because: Needs further monitoring, may need embolization for lower GI bleeding by IR.  Estimated body mass index is 21.54 kg/m as calculated from the following:  Height as of this encounter: 5\' 3"  (1.6 m).   Weight as of this encounter: 55.2 kg.    Nutritional Assessment: Body mass index is 21.54 kg/m.Marland Kitchen Seen by dietician.  I agree with the assessment and plan as outlined below: Nutrition Status:        . Skin Assessment: I have examined the patient's skin and I agree with the wound  assessment as performed by the wound care RN as outlined below:    Consultants:  GI  Procedures:  None  Antimicrobials:  Anti-infectives (From admission, onward)    Start     Dose/Rate Route Frequency Ordered Stop   11/03/22 1330  hydroxychloroquine (PLAQUENIL) tablet 200 mg        200 mg Oral Daily 11/03/22 1235           Subjective: Patient seen and examined.  Daughter at the bedside.  Patient has no complaints other than dry cough, she has lung fibrosis.  No bowel movements in last 24 hours.  Objective: Vitals:   11/06/22 1802 11/06/22 1937 11/07/22 0422 11/07/22 0846  BP: 110/61 (!) 121/57 121/67 (!) 114/57  Pulse: 70 70 70 70  Resp:  16 17   Temp: 98.3 F (36.8 C) 98.1 F (36.7 C) 98.3 F (36.8 C) 98 F (36.7 C)  TempSrc:  Oral Oral   SpO2: 97% 99% 99% 98%  Weight:      Height:        Intake/Output Summary (Last 24 hours) at 11/07/2022 1414 Last data filed at 11/07/2022 1406 Gross per 24 hour  Intake 1204 ml  Output 350 ml  Net 854 ml   Filed Weights   11/04/22 0418 11/05/22 0500 11/06/22 0500  Weight: 54.7 kg 57.5 kg 55.2 kg    Examination:  General exam: Appears calm and comfortable  Respiratory system:: Chronic Rales. Respiratory effort normal. Cardiovascular system: S1 & S2 heard, RRR. No JVD, murmurs, rubs, gallops or clicks. No pedal edema. Gastrointestinal system: Abdomen is nondistended, soft and nontender. No organomegaly or masses felt. Normal bowel sounds heard. Central nervous system: Alert and oriented. No focal neurological deficits. Extremities: Symmetric 5 x 5 power. Skin: No rashes, lesions or ulcers.  Psychiatry: Judgement and insight appear normal. Mood & affect appropriate.   Data Reviewed: I have personally reviewed following labs and imaging studies  CBC: Recent Labs  Lab 11/02/22 1523 11/03/22 0031 11/03/22 0222 11/04/22 0307 11/05/22 0526 11/05/22 1150 11/06/22 0338 11/06/22 0914 11/06/22 1735 11/07/22 0855   WBC 11.1*  --  10.2  --  7.4  --  6.7  --   --  8.3  NEUTROABS 7.7  --   --   --   --   --   --   --   --   --   HGB 9.6*   < > 11.4*   < > 7.4* 7.2* 6.7* 7.2* 8.2* 8.1*  HCT 30.1*   < > 33.1*   < > 22.1* 21.6* 20.7* 21.4* 24.4* 23.7*  MCV 102.4*  --  91.7  --  96.5  --  95.8  --   --  94.8  PLT 186  --  141*  --  115*  --  123*  --   --  118*   < > = values in this interval not displayed.   Basic Metabolic Panel: Recent Labs  Lab 11/02/22 1523 11/03/22 0222 11/04/22 0307 11/05/22 1150  NA 137 140 131* 132*  K 3.5 4.2 3.7 3.7  CL 103 102 95* 99  CO2 26 23 25 23   GLUCOSE 142* 102* 114* 97  BUN 26* 31* 38* 29*  CREATININE 1.33* 1.20* 2.12* 1.34*  CALCIUM 8.6* 8.7* 8.1* 8.2*   GFR: Estimated Creatinine Clearance: 30 mL/min (A) (by C-G formula based on SCr of 1.34 mg/dL (H)). Liver Function Tests: Recent Labs  Lab 11/02/22 1523  AST 26  ALT 20  ALKPHOS 71  BILITOT 0.6  PROT 6.5  ALBUMIN 2.7*   No results for input(s): "LIPASE", "AMYLASE" in the last 168 hours. No results for input(s): "AMMONIA" in the last 168 hours. Coagulation Profile: No results for input(s): "INR", "PROTIME" in the last 168 hours. Cardiac Enzymes: No results for input(s): "CKTOTAL", "CKMB", "CKMBINDEX", "TROPONINI" in the last 168 hours. BNP (last 3 results) No results for input(s): "PROBNP" in the last 8760 hours. HbA1C: No results for input(s): "HGBA1C" in the last 72 hours. CBG: Recent Labs  Lab 11/03/22 1128 11/03/22 1925 11/03/22 2320 11/04/22 0332 11/04/22 0753  GLUCAP 87 180* 114* 112* 109*   Lipid Profile: No results for input(s): "CHOL", "HDL", "LDLCALC", "TRIG", "CHOLHDL", "LDLDIRECT" in the last 72 hours. Thyroid Function Tests: No results for input(s): "TSH", "T4TOTAL", "FREET4", "T3FREE", "THYROIDAB" in the last 72 hours. Anemia Panel: No results for input(s): "VITAMINB12", "FOLATE", "FERRITIN", "TIBC", "IRON", "RETICCTPCT" in the last 72 hours. Sepsis Labs: Recent Labs   Lab 11/02/22 1644  LATICACIDVEN 2.0*    Recent Results (from the past 240 hour(s))  MRSA Next Gen by PCR, Nasal     Status: None   Collection Time: 11/02/22 10:12 PM   Specimen: Nasal Mucosa; Nasal Swab  Result Value Ref Range Status   MRSA by PCR Next Gen NOT DETECTED NOT DETECTED Final    Comment: (NOTE) The GeneXpert MRSA Assay (FDA approved for NASAL specimens only), is one component of a comprehensive MRSA colonization surveillance program. It is not intended to diagnose MRSA infection nor to guide or monitor treatment for MRSA infections. Test performance is not FDA approved in patients less than 62 years old. Performed at [facility], [address], [place], [place] [address]      Radiology Studies: No results found.  Scheduled Meds:  allopurinol  100 mg Oral Daily   Chlorhexidine Gluconate Cloth  6 each Topical Daily   fluticasone furoate-vilanterol  1 puff Inhalation Daily   And   umeclidinium bromide  1 puff Inhalation Daily   hydroxychloroquine  200 mg Oral Daily   levETIRAcetam  500 mg Oral BID   levothyroxine  25 mcg Oral Daily   pantoprazole (PROTONIX) IV  40 mg Intravenous Q12H   predniSONE  5 mg Oral Q breakfast   Continuous Infusions:     LOS: 5 days   Hughie Closs, MD [facility]  11/07/2022, 2:14 PM   *Please note that this is a verbal dictation therefore any spelling or grammatical errors are due to the "Dragon Medical One" system interpretation.  Please page via Amion and do not message via secure chat for urgent patient care matters. Secure chat can be used for non urgent patient care matters.  How to contact the [facility] Attending or Consulting provider 7A - 7P or covering provider during after hours 7P -7A, for this patient?  Check the care team in [facility] and look for a) attending/consulting TRH provider listed and b) the [facility] team listed. Page or secure chat 7A-7P. Log into [email or web address] and use [place]'s universal password to  access. If you  do not have the password, please contact the hospital operator. Locate the [facility] provider you are looking for under [facility] and page to a number that you can be directly reached. If you still have difficulty reaching the provider, please page the [facility] (Director on Call) for the Hospitalists listed on amion for assistance.

## 2022-11-07 NOTE — Progress Notes (Signed)
Lindsey Huber 9:10 AM  Subjective: Patient without any signs of bleeding and no bowel movement since 2 nights ago and tolerating her diet and has no other complaints except for cough and no abdominal pain  Objective: Vital signs stable afebrile no acute distress abdomen is soft nontender no labs today last night's hemoglobin okay  Assessment: Presumed diverticular bleeding worse on blood thinners currently on hold  Plan: Await to see how she does when blood thinners are restarted and hopefully she will be fine but please call us back if signs of recurrent bleeding when blood thinners resumed or if any other GI question or problem  [facility]  office [phone] After 5PM or if no answer call [phone]

## 2022-11-08 ENCOUNTER — Ambulatory Visit: Payer: Medicare HMO | Admitting: Internal Medicine

## 2022-11-08 DIAGNOSIS — Z79899 Other long term (current) drug therapy: Secondary | ICD-10-CM

## 2022-11-08 DIAGNOSIS — K922 Gastrointestinal hemorrhage, unspecified: Secondary | ICD-10-CM | POA: Diagnosis not present

## 2022-11-08 DIAGNOSIS — M329 Systemic lupus erythematosus, unspecified: Secondary | ICD-10-CM

## 2022-11-08 DIAGNOSIS — Z7952 Long term (current) use of systemic steroids: Secondary | ICD-10-CM

## 2022-11-08 DIAGNOSIS — I7300 Raynaud's syndrome without gangrene: Secondary | ICD-10-CM

## 2022-11-08 DIAGNOSIS — M1A00X0 Idiopathic chronic gout, unspecified site, without tophus (tophi): Secondary | ICD-10-CM

## 2022-11-08 DIAGNOSIS — M069 Rheumatoid arthritis, unspecified: Secondary | ICD-10-CM

## 2022-11-08 DIAGNOSIS — Z5181 Encounter for therapeutic drug level monitoring: Secondary | ICD-10-CM

## 2022-11-08 DIAGNOSIS — J84112 Idiopathic pulmonary fibrosis: Secondary | ICD-10-CM

## 2022-11-08 LAB — CBC
HCT: 24.4 % — ABNORMAL LOW (ref 36.0–46.0)
HCT: 23.7 % — ABNORMAL LOW (ref 36.0–46.0)
Hemoglobin: 8 g/dL — ABNORMAL LOW (ref 12.0–15.0)
Hemoglobin: 8 g/dL — ABNORMAL LOW (ref 12.0–15.0)
MCH: 31.6 pg (ref 26.0–34.0)
MCH: 32.5 pg (ref 26.0–34.0)
MCHC: 32.8 g/dL (ref 30.0–36.0)
MCHC: 33.8 g/dL (ref 30.0–36.0)
MCV: 96.4 fL (ref 80.0–100.0)
MCV: 96.3 fL (ref 80.0–100.0)
Platelets: 118 10*3/uL — ABNORMAL LOW (ref 150–400)
Platelets: 128 10*3/uL — ABNORMAL LOW (ref 150–400)
RBC: 2.53 MIL/uL — ABNORMAL LOW (ref 3.87–5.11)
RBC: 2.46 MIL/uL — ABNORMAL LOW (ref 3.87–5.11)
RDW: 17.2 % — ABNORMAL HIGH (ref 11.5–15.5)
RDW: 16.8 % — ABNORMAL HIGH (ref 11.5–15.5)
WBC: 7.9 10*3/uL (ref 4.0–10.5)
WBC: 9.8 10*3/uL (ref 4.0–10.5)
nRBC: 1 % — ABNORMAL HIGH (ref 0.0–0.2)
nRBC: 0.5 % — ABNORMAL HIGH (ref 0.0–0.2)

## 2022-11-08 NOTE — Progress Notes (Deleted)
Name: Lindsey Huber  MRN/ DOB: [id number], 12/08/1945    Age/ Sex: 77 y.o., female    PCP: Collene Mares, [name]   Reason for Endocrinology Evaluation: MNG, adrenal insufficiency      Date of Initial Endocrinology Evaluation: 08/20/2020    HPI: Lindsey Huber is a 77 y.o. female with a past medical history of MNG, Gout, Adrenal Insufficiency, CHF, gout  and SLE. The patient presented for initial endocrinology clinic visit on 08/20/2020 for consultative assistance with her MNG/ adrenal Insufficiency .    Moved from [place]    MNG History:  Has been diagnosed with MNG years ago. She is not sure of thyroid biopsies in the past . Has been on LT-4 replacement daily    No Fh of thyroid disease   Thyroid ultrasound on 08/29/2020 revealing multinodular goiter with a peripherally calcified nodule in the left mid gland meets consensus criteria to consider fine-needle aspiration biopsy.   She is S/P benign FNA 04/2021    Adrenal History :  She was started on [facility] in 02/2020 following an admission for septic shock . Prior to that she was on Prednisone for years , she was on this though rheumatology for RA   ACTH was at the low end of normal at 6 PG/mL with low normal plasma cortisol at 3.7 UG/DL 07/8293    SUBJECTIVE:    Today (11/08/22):  [name] is here for follow-up on multinodular goiter and secondary adrenal insufficiency She is accompanied by her granddaughter [name]  Has occasional left local neck swelling   Has occasional  palpitations  Had diarrhea last month  Has medical alert bracelet  Has acid reflux  heartburn and hoarseness    HOME ENDOCRINE MEDICATIONS : Prednisone 5 mg daily Levothyroxine 25 mcg daily     HISTORY:  Past Medical History:  Past Medical History:  Diagnosis Date   Achalasia    Acquired hypothyroidism 08/20/2020   Acute metabolic encephalopathy 07/25/2021   AICD (automatic cardioverter/defibrillator) present 11/14/2019   Allergic  rhinitis    Ankle fracture 03/25/2022   Aortic valve disorder 03/26/2002   Atherosclerosis of abdominal aorta (HCC) 05/01/2020   Cardiomyopathy (HCC)    CHB (complete heart block) (HCC) 08/18/2017   CHF (congestive heart failure) (HCC)    Cholelithiasis without obstruction 05/01/2020   Chronic gouty arthritis    Chronic heart failure with preserved ejection fraction (HCC) 05/01/2020   Chronic kidney disease (CKD), active medical management without dialysis, stage 3 (moderate) (HCC) 05/01/2020   Closed torus fracture of distal end of right radius with delayed healing 08/12/2021   Delusional thoughts (HCC)    Diverticulosis of colon 05/01/2020   Epistaxis    Essential (primary) hypertension 08/18/2017   Gastroesophageal reflux disease 05/01/2020   Gastrointestinal hemorrhage    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 11/14/2019   Hypercoagulability due to atrial fibrillation (HCC) 05/01/2020   Hyperlipidemia 11/14/2019   Hypocalcemia 12/14/2020   Hypoglycemia    Hypokalemia 12/14/2020   Hypomagnesemia 12/14/2020   Hypotension 12/14/2020   Idiopathic pulmonary fibrosis (HCC) 05/01/2020   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   Iron deficiency anemia    Long term (current) use of anticoagulants    Malnutrition of mild degree [name]: 75% to less than 90% of standard weight) (HCC)    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/12/2021   Mitral and aortic incompetence 11/14/2019   Non-rheumatic atrial fibrillation (HCC) 05/01/2020   Non-toxic multinodular goiter 08/20/2020   NSVT (nonsustained ventricular tachycardia) (  HCC) 08/18/2017   Oropharyngeal dysphagia    Osteoarthritis of hip 05/01/2020   Osteoarthritis of knee 05/01/2020   Osteopenia of neck of left femur    Pain of left hip joint 12/12/2017   Proteinuria 05/01/2020   Raynaud's disease    Recurrent falls 04/09/2021   Rheumatoid arthritis (HCC)    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   Septic shock (HCC) 03/10/2020   Slow transit constipation    Systemic lupus erythematosus (HCC) 05/01/2020   Thrombophilia (HCC)    Transient ischemic attack    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   Vitamin D deficiency    Past Surgical History:  Past Surgical History:  Procedure Laterality Date   BREAST BIOPSY Left    CARDIAC VALVE SURGERY     CARPAL TUNNEL RELEASE     COLONOSCOPY WITH PROPOFOL N/A 12/20/2020   Procedure: COLONOSCOPY WITH PROPOFOL;  Surgeon: Willis Modena, MD;  Location: [facility] ENDOSCOPY;  Service: Endoscopy;  Laterality: N/A;   HEMORRHOID SURGERY     PACEMAKER INSERTION     RIGHT/LEFT HEART CATH AND CORONARY ANGIOGRAPHY N/A 11/27/2020   Procedure: RIGHT/LEFT HEART CATH AND CORONARY ANGIOGRAPHY;  Surgeon: Dolores Patty, MD;  Location: [facility] INVASIVE CV LAB;  Service: Cardiovascular;  Laterality: N/A;   TONSILLECTOMY      Social History:  reports that she has never smoked. She has been exposed to tobacco smoke. She has never used smokeless tobacco. She reports that she does not drink alcohol and does not use drugs. Family History: family history includes Cancer in her brother; Diabetes in her brother; Heart disease in her father, mother, and sister; Hypertension in her father and mother; Osteoarthritis in her father and mother; Rheum arthritis in her mother.   HOME MEDICATIONS: Allergies as of 11/08/2022       Reactions   Other Other (See Comments)   Patient is to NOT EAT any foods with husks or tree nuts, popcorn   Propofol Shortness Of Breath, Other (See Comments)   "Caused asthma"   Flecainide Other (See Comments)   Reaction not recalled   Amiodarone Other (See Comments)   Delirium/Confusion/Psychosis   Amlodipine Other (See Comments)   "Tired and syncope"        Medication List      Notice   This visit is during an admission. Changes to the med list made in this visit will be reflected in the After Visit  Summary of the admission.       REVIEW OF SYSTEMS: A comprehensive ROS was conducted with the patient and is negative except as per HPI    OBJECTIVE:  VS: There were no vitals taken for this visit.   Wt Readings from Last 3 Encounters:  11/07/22 124 lb 9 oz (56.5 kg)  10/26/22 119 lb (54 kg)  10/11/22 121 lb (54.9 kg)     EXAM: General: Pt appears well and is in NAD  Neck: General: Supple without adenopathy. Thyroid: Thyroid size normal.  Left thyroid nodule appreciated  Lungs: Clear with good BS bilat with no rales, rhonchi, or wheezes  Heart: Auscultation: RRR.  Abdomen: Normoactive bowel sounds, soft, nontender, without masses or organomegaly palpable  Extremities:  BL LE: No pretibial edema normal ROM and strength.  Mental Status: Judgment, insight: Intact Orientation: Oriented to time, place, and person Mood and affect: No depression, anxiety, or agitation     DATA REVIEWED:   Latest Reference Range & Units Most Recent  TSH 0.350 -  4.500 uIU/mL 2.075 07/25/21 18:18    Latest Reference Range & Units 10/26/21 15:08  Sodium 135 - 146 mmol/L 142  Potassium 3.5 - 5.3 mmol/L 3.6  Chloride 98 - 110 mmol/L 105  CO2 20 - 32 mmol/L 26  Glucose 65 - 99 mg/dL 81  BUN 7 - 25 mg/dL 27 (H)  Creatinine 5.78 - 1.00 mg/dL 4.69  Calcium 8.6 - 62.9 mg/dL 9.1  BUN/Creatinine Ratio 6 - 22 (calc) 31 (H)  eGFR > OR = 60 mL/min/1.4m2 69  AG Ratio 1.0 - 2.5 (calc) 0.9 (L)  AST 10 - 35 U/L 26  ALT 6 - 29 U/L 15  Total Protein 6.1 - 8.1 g/dL 7.7  Total Bilirubin 0.2 - 1.2 mg/dL 0.7    Thyroid ultrasound 12/10/2021 Estimated total number of nodules >/= 1 cm: 2   Number of spongiform nodules >/=  2 cm not described below (TR1): 0   Number of mixed cystic and solid nodules >/= 1.5 cm not described below (TR2): 0   _________________________________________________________   Nodule 1: 2.2 x 1.4 x 1.3 cm heterogeneous region in the mid right thyroid lobe is unchanged since  prior examination. This again is favored to be a pseudo nodule given lack of define margins.   _________________________________________________________   Nodule 2: 0.8 x 0.7 x 0.4 cm hypoechoic solid nodule in the medial mid right thyroid lobe does not meet criteria for imaging surveillance or FNA.   _________________________________________________________   Nodule 3: 1.7 x 1.6 x 1.5 cm solid hypoechoic nodule in the mid left thyroid lobe is unchanged in size since prior examination. Please correlate with prior FNA results from 05/20/2021.   IMPRESSION: Previously biopsied left mid thyroid nodule is unchanged in size since prior examination. Please correlate with prior FNA results from 05/20/2021.     FNA left mid nodule 05/20/2021    Clinical History: Left mid 2.0cm; Other 2 dimensions: 1.6 x 1.5cm, Solid  /almost completely solid, peripheral calcifications, TI-RADS total  points 5  Specimen Submitted:  A. THYROID, LEFT MID, FINE NEEDLE ASPIRATION:    FINAL MICROSCOPIC DIAGNOSIS:  - Consistent with benign follicular nodule (Bethesda category II)    ASSESSMENT/PLAN/RECOMMENDATIONS:   Multinodular Goiter :   -Patient with occasional local neck symptoms -She is s/p benign FNA of the left nodule 04/2021 -She is due for annual thyroid ultrasound, an order has been placed   2. Hypothyroidism:  -TSH normal -No change   Medication Continue levothyroxine 25 MCG daily   3.  Secondary adrenal insufficiency:   -This is due to chronic use of glucocorticoids for rheumatoid arthritis.  She was on prednisone for years which was switched to hydrocortisone since her hospitalization in [place] in 02/2020 -She has a medical alert bracelet -We discussed sick day will   Medication  Can continue prednisone 5 mg daily with breakfast    Follow-up in 1 yr       Signed electronically by: Lyndle Herrlich, MD  [facility] Endocrinology  [facility] [address] [place] [place], [place] [address] Phone: [phone] FAX: [phone]   CC: Collene Mares, [name] [address] [place] [address] Phone: [phone] Fax: [phone]   Return to Endocrinology clinic as below: Future Appointments  Date Time Provider Department Center  11/08/2022  1:00 PM Inza Mikrut, Konrad Dolores, MD [facility] None  11/08/2022  3:20 PM Fuller Plan, MD [facility] None  11/11/2022  9:00 AM [facility] INJ 1 [facility]  11/11/2022  2:00 PM [facility]-NM 1 [facility]  11/16/2022  7:00 AM [facility] DEVICE REMOTES [facility]  12/08/2022  2:30 PM Shon Millet R, DO [facility] None  12/13/2022  9:15 AM Hunsucker, Lesia Sago, MD [facility] None  02/15/2023  7:00 AM [facility] DEVICE REMOTES [facility]  07/11/2023  3:30 PM Drema Dallas, DO [facility] None

## 2022-11-08 NOTE — Plan of Care (Signed)

## 2022-11-08 NOTE — Progress Notes (Signed)
PROGRESS NOTE    Lindsey Huber  VZD:[id number] DOB: 04/18/1945 DOA: 11/02/2022 PCP: Collene Mares, [place]   Brief Narrative:  Patient is a 77 yo F w/ A-fib on Xarelto, SSS/PPM, ILD/pulmonary fibrosis/lupus followed by [name], COPD, diastolic CHF, OSA, aortic, mitral, tricuspid valve disease s/p AVR/MV ring/TVR, CKD 3 AA presented to [facility] ED on 10/8 with GIB.   Patient recently admitted on 06/2022 with hematochezia and hemorrhagic shock.  Patient given Kcentra, vitamin K and pressors.CTA showing extravasation at the junction of descending and sigmoid colon and duodenal enteritis. Given 2 units of PRBCs. CTA abd/pelvis w/ extravasation from diverticulum in proximal sigmoid, consistent w/ diverticulr hemorrhage.  Admitted to PCCM, GI consulted.  Further details below.  Assessment & Plan:   Principal Problem:   Acute lower GI bleeding Active Problems:   Essential (primary) hypertension   Acquired hypothyroidism   Long term (current) use of anticoagulants   ABLA (acute blood loss anemia)   Seizure (HCC)  Acute blood loss anemia secondary to lower GI bleed likely secondary to diverticular bleed/ Hemorrhagic shock Patient is on Xarelto and presented with hemoglobin 9.6.  Given Kcentra.  Given vitamin K.  Status post 2 packed red blood cells.  Hemoglobin back to 11.6 then down to 9.5 yesterday.  Was started on heparin after GI clearance on 11/04/2022 with the plan to resume back Xarelto in 3 days.  However due to overdose of heparin leading to high PT, patient had another episode of rectal bleeding which was brief early morning 11/05/2022.  Hemoglobin dropped to 7.4, heparin held for an hour and resumed again.  Patient had 1-2 black stools but no bright red blood per rectum.  Hemoglobin dropped to 6.7 on the morning of 11/06/2022, heparin stopped, 1 unit of transfusion given.  She had 1 more bowel movement last night at around 10 PM and according to her, there was some blood noted.  Although  her hemoglobin has remained stable however it would be wise to be on the safer side and thus hold anticoagulation for another 24 hours.  I have discussed this with the patient and her daughter over the phone and they are in agreement.    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   #HFpEF Most recent echo showing ejection fraction of 60 to 65%.  No acute concern for exacerbation at this time.  Will continue to monitor volume status.  Home medication includes furosemide 40 mg daily, metoprolol tartrate 12.5 mg twice daily. -Given softer blood pressures, holding this for now -Once patient has improved blood pressures can resume   # AKI on CKD stage IIIa Baseline creatinine around 1.2 with GFR of 47 but creatinine jumped to 2.12 11/04/2022, improved to 1.3 after 1 L of IV fluid.   #History of lupus Currently on prednisone 5 mg daily at home and hydroxychloroquine 200 mg daily. -Resume home prednisone and hydroxychloroquine   #History of seizures Patient is on Keppra 500 mg twice daily at home.  No acute concerns at this time for seizures. -Continue home Keppra 500 mg twice daily  DVT prophylaxis: SCDs Start: 11/02/22 2023   Code Status: Full Code  Family Communication: None present at bedside.  Plan of care discussed with patient in person and daughter over the phone in length and he/she verbalized understanding and agreed with it.  Status is: Inpatient Remains inpatient appropriate because: Needs further monitoring, may need embolization for lower  GI bleeding by IR.  Estimated body mass index is 21.67 kg/m as calculated from the following:   Height as of this encounter: 5\' 3"  (1.6 m).   Weight as of this encounter: 55.5 kg.    Nutritional Assessment: Body mass index is 21.67 kg/m.Marland Kitchen Seen by dietician.  I agree with the assessment and plan as outlined below: Nutrition  Status:        . Skin Assessment: I have examined the patient's skin and I agree with the wound assessment as performed by the wound care RN as outlined below:    Consultants:  GI  Procedures:  None  Antimicrobials:  Anti-infectives (From admission, onward)    Start     Dose/Rate Route Frequency Ordered Stop   11/03/22 1330  hydroxychloroquine (PLAQUENIL) tablet 200 mg        200 mg Oral Daily 11/03/22 1235           Subjective: Patient seen and examined.  She has no complaints.  She told me that she had 1 bowel movement last night which had some blood in it.  Objective: Vitals:   11/07/22 2100 11/08/22 0416 11/08/22 0722 11/08/22 0723  BP:  104/60  138/61  Pulse:  70  71  Resp: (!) 22 17  16   Temp:  98.7 F (37.1 C)  98.8 F (37.1 C)  TempSrc:    Oral  SpO2:  95%  100%  Weight: 56.5 kg  55.5 kg   Height:        Intake/Output Summary (Last 24 hours) at 11/08/2022 1359 Last data filed at 11/07/2022 1456 Gross per 24 hour  Intake 120 ml  Output 0 ml  Net 120 ml   Filed Weights   11/06/22 0500 11/07/22 2100 11/08/22 0722  Weight: 55.2 kg 56.5 kg 55.5 kg    Examination:  General exam: Appears calm and comfortable  Respiratory system: Clear to auscultation. Respiratory effort normal. Cardiovascular system: S1 & S2 heard, RRR. No JVD, murmurs, rubs, gallops or clicks. No pedal edema. Gastrointestinal system: Abdomen is nondistended, soft and nontender. No organomegaly or masses felt. Normal bowel sounds heard. Central nervous system: Alert and oriented. No focal neurological deficits. Extremities: Symmetric 5 x 5 power. Skin: No rashes, lesions or ulcers.  Psychiatry: Judgement and insight appear normal. Mood & affect appropriate.    Data Reviewed: I have personally reviewed following labs and imaging studies  CBC: Recent Labs  Lab 11/02/22 1523 11/03/22 0031 11/05/22 0526 11/05/22 1150 11/06/22 0338 11/06/22 0914 11/06/22 1735 11/07/22 0855  11/07/22 1638 11/08/22 0615  WBC 11.1*   < > 7.4  --  6.7  --   --  8.3 7.7 7.9  NEUTROABS 7.7  --   --   --   --   --   --   --   --   --   HGB 9.6*   < > 7.4*   < > 6.7* 7.2* 8.2* 8.1* 8.3* 8.0*  HCT 30.1*   < > 22.1*   < > 20.7* 21.4* 24.4* 23.7* 24.1* 24.4*  MCV 102.4*   < > 96.5  --  95.8  --   --  94.8 94.9 96.4  PLT 186   < > 115*  --  123*  --   --  118* 125* 118*   < > = values in this interval not displayed.   Basic Metabolic Panel: Recent Labs  Lab 11/02/22 1523 11/03/22 0222 11/04/22 0307 11/05/22 1150  NA 137  140 131* 132*  K 3.5 4.2 3.7 3.7  CL 103 102 95* 99  CO2 26 23 25 23   GLUCOSE 142* 102* 114* 97  BUN 26* 31* 38* 29*  CREATININE 1.33* 1.20* 2.12* 1.34*  CALCIUM 8.6* 8.7* 8.1* 8.2*   GFR: Estimated Creatinine Clearance: 30 mL/min (A) (by C-G formula based on SCr of 1.34 mg/dL (H)). Liver Function Tests: Recent Labs  Lab 11/02/22 1523  AST 26  ALT 20  ALKPHOS 71  BILITOT 0.6  PROT 6.5  ALBUMIN 2.7*   No results for input(s): "LIPASE", "AMYLASE" in the last 168 hours. No results for input(s): "AMMONIA" in the last 168 hours. Coagulation Profile: No results for input(s): "INR", "PROTIME" in the last 168 hours. Cardiac Enzymes: No results for input(s): "CKTOTAL", "CKMB", "CKMBINDEX", "TROPONINI" in the last 168 hours. BNP (last 3 results) No results for input(s): "PROBNP" in the last 8760 hours. HbA1C: No results for input(s): "HGBA1C" in the last 72 hours. CBG: Recent Labs  Lab 11/03/22 1128 11/03/22 1925 11/03/22 2320 11/04/22 0332 11/04/22 0753  GLUCAP 87 180* 114* 112* 109*   Lipid Profile: No results for input(s): "CHOL", "HDL", "LDLCALC", "TRIG", "CHOLHDL", "LDLDIRECT" in the last 72 hours. Thyroid Function Tests: No results for input(s): "TSH", "T4TOTAL", "FREET4", "T3FREE", "THYROIDAB" in the last 72 hours. Anemia Panel: No results for input(s): "VITAMINB12", "FOLATE", "FERRITIN", "TIBC", "IRON", "RETICCTPCT" in the last 72  hours. Sepsis Labs: Recent Labs  Lab 11/02/22 1644  LATICACIDVEN 2.0*    Recent Results (from the past 240 hour(s))  MRSA Next Gen by PCR, Nasal     Status: None   Collection Time: 11/02/22 10:12 PM   Specimen: Nasal Mucosa; Nasal Swab  Result Value Ref Range Status   MRSA by PCR Next Gen NOT DETECTED NOT DETECTED Final    Comment: (NOTE) The GeneXpert MRSA Assay (FDA approved for NASAL specimens only), is one component of a comprehensive MRSA colonization surveillance program. It is not intended to diagnose MRSA infection nor to guide or monitor treatment for MRSA infections. Test performance is not FDA approved in patients less than 39 years old. Performed at [facility], [address] [place], [place] [address]      Radiology Studies: No results found.  Scheduled Meds:  allopurinol  100 mg Oral Daily   fluticasone furoate-vilanterol  1 puff Inhalation Daily   And   umeclidinium bromide  1 puff Inhalation Daily   hydroxychloroquine  200 mg Oral Daily   levETIRAcetam  500 mg Oral BID   levothyroxine  25 mcg Oral Daily   pantoprazole (PROTONIX) IV  40 mg Intravenous Q12H   predniSONE  5 mg Oral Q breakfast   Continuous Infusions:     LOS: 6 days   Hughie Closs, MD [facility]  11/08/2022, 1:59 PM   *Please note that this is a verbal dictation therefore any spelling or grammatical errors are due to the "Dragon [employer] One" system interpretation.  Please page via Amion and do not message via secure chat for urgent patient care matters. Secure chat can be used for non urgent patient care matters.  How to contact the [facility] Attending or Consulting provider 7A - 7P or covering provider during after hours 7P -7A, for this patient?  Check the care team in [facility] and look for a) attending/consulting [facility] provider listed and b) the [facility] team listed. Page or secure chat 7A-7P. Log into [email or web address] and use [facility]'s universal password to access. If you do  not have the password, please contact the hospital operator. Locate the [facility] provider you are looking for under [facility] and page to a number that you can be directly reached. If you still have difficulty reaching the provider, please page the [facility] (Director on Call) for the Hospitalists listed on amion for assistance.

## 2022-11-09 DIAGNOSIS — K922 Gastrointestinal hemorrhage, unspecified: Secondary | ICD-10-CM | POA: Diagnosis not present

## 2022-11-09 LAB — CBC
HCT: 24.3 % — ABNORMAL LOW (ref 36.0–46.0)
Hemoglobin: 8.1 g/dL — ABNORMAL LOW (ref 12.0–15.0)
MCH: 32.4 pg (ref 26.0–34.0)
MCHC: 33.3 g/dL (ref 30.0–36.0)
MCV: 97.2 fL (ref 80.0–100.0)
Platelets: 124 10*3/uL — ABNORMAL LOW (ref 150–400)
RBC: 2.5 MIL/uL — ABNORMAL LOW (ref 3.87–5.11)
RDW: 16.6 % — ABNORMAL HIGH (ref 11.5–15.5)
WBC: 7.9 10*3/uL (ref 4.0–10.5)
nRBC: 0.8 % — ABNORMAL HIGH (ref 0.0–0.2)

## 2022-11-09 LAB — HEPARIN LEVEL (UNFRACTIONATED): Heparin Unfractionated: 0.11 [IU]/mL — ABNORMAL LOW (ref 0.30–0.70)

## 2022-11-09 MED ORDER — ACETAMINOPHEN 500 MG PO TABS
1000.0000 mg | ORAL_TABLET | Freq: Four times a day (QID) | ORAL | Status: DC | PRN
  Administered 2022-11-09: 1000 mg via ORAL
  Filled 2022-11-09: qty 2

## 2022-11-09 MED ORDER — HEPARIN (PORCINE) 25000 UT/250ML-% IV SOLN
650.0000 [IU]/h | INTRAVENOUS | Status: DC
  Administered 2022-11-09: 450 [IU]/h via INTRAVENOUS
  Administered 2022-11-11: 650 [IU]/h via INTRAVENOUS
  Filled 2022-11-09 (×2): qty 250

## 2022-11-09 NOTE — Progress Notes (Signed)
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   Allergies  Allergen Reactions   Other Other (See Comments)    Patient is to NOT EAT any foods with husks or tree nuts, popcorn   Propofol Shortness Of Breath and Other (See Comments)    "Caused asthma"   Flecainide Other (See Comments)    Reaction not recalled   Amiodarone Other (See Comments)    Delirium/Confusion/Psychosis   Amlodipine Other (See Comments)    "Tired and syncope"    Patient Measurements: Height: 5\' 3"  (160 cm) Weight: 55.5 kg (122 lb 5.7 oz) IBW/kg (Calculated) : 52.4 Heparin Dosing Weight: 54.7 kg  Vital Signs: Temp: 98 F (36.7 C) (10/15 0928) Temp Source: Oral (10/15 0505) BP: 116/69 (10/15 0928) Pulse Rate: 70 (10/15 0928)  Labs: Recent Labs    11/08/22 0615 11/08/22 1842 11/09/22 0358  HGB 8.0* 8.0* 8.1*  HCT 24.4* 23.7* 24.3*  PLT 118* 128* 124*    Estimated Creatinine Clearance: 30 mL/min (A) (by C-G formula based on SCr of 1.34 mg/dL (H)).   Medical History: Past Medical History:  Diagnosis Date   Achalasia    Acquired hypothyroidism 08/20/2020   Acute metabolic encephalopathy 07/25/2021   AICD (automatic cardioverter/defibrillator) present 11/14/2019   Allergic rhinitis    Ankle fracture 03/25/2022   Aortic valve disorder 03/26/2002   Atherosclerosis of abdominal aorta (HCC) 05/01/2020   Cardiomyopathy (HCC)    CHB (complete heart block) (HCC) 08/18/2017   CHF (congestive heart failure) (HCC)    Cholelithiasis without obstruction 05/01/2020   Chronic gouty arthritis    Chronic heart failure with preserved ejection fraction (HCC) 05/01/2020   Chronic kidney disease (CKD), active medical management without dialysis, stage 3 (moderate) (HCC) 05/01/2020   Closed torus fracture of distal end of right radius with delayed healing 08/12/2021   Delusional thoughts (HCC)     Diverticulosis of colon 05/01/2020   Epistaxis    Essential (primary) hypertension 08/18/2017   Gastroesophageal reflux disease 05/01/2020   Gastrointestinal hemorrhage    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 11/14/2019   Hypercoagulability due to atrial fibrillation (HCC) 05/01/2020   Hyperlipidemia 11/14/2019   Hypocalcemia 12/14/2020   Hypoglycemia    Hypokalemia 12/14/2020   Hypomagnesemia 12/14/2020   Hypotension 12/14/2020   Idiopathic pulmonary fibrosis (HCC) 05/01/2020   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   Iron deficiency anemia    Long term (current) use of anticoagulants    Malnutrition of mild degree [name]: 75% to less than 90% of standard weight) (HCC)    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/12/2021   Mitral and aortic incompetence 11/14/2019   Non-rheumatic atrial fibrillation (HCC) 05/01/2020   Non-toxic multinodular goiter 08/20/2020   NSVT (nonsustained ventricular tachycardia) (HCC) 08/18/2017   Oropharyngeal dysphagia    Osteoarthritis of hip 05/01/2020   Osteoarthritis of knee 05/01/2020   Osteopenia of neck of left femur    Pain of left hip joint 12/12/2017   Proteinuria 05/01/2020   Raynaud's disease    Recurrent falls 04/09/2021   Rheumatoid arthritis (HCC)    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   Septic shock (HCC) 03/10/2020   Slow transit constipation    Systemic lupus erythematosus (HCC) 05/01/2020   Thrombophilia (HCC)    Transient ischemic attack    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   Vitamin D deficiency     Medications:  Scheduled:   allopurinol  100  mg Oral Daily   fluticasone furoate-vilanterol  1 puff Inhalation Daily   And   umeclidinium bromide  1 puff Inhalation Daily   hydroxychloroquine  200 mg Oral Daily   levETIRAcetam  500 mg Oral BID   levothyroxine  25 mcg Oral Daily   pantoprazole (PROTONIX) IV  40 mg Intravenous Q12H   predniSONE  5 mg Oral Q  breakfast   Infusions:     Assessment: 77 years of age female with history of multi-vavular disease with bioprosthetic valves and atrial fibrillation on Xarelto prior to admission who presented with diverticular bleed. Xarelto was held (last dose 10/7) and Kcentra 25 units/kg and 1 unit PRBC given on 10/8 PM.   Heparin has been held since 10/12 AM d/t concern for recurrent diverticular bleed with blood noted in stool on multiple occasions- last observed 10/13 PM per MD note. After multiple CBCs, patient's Hgb has remained stable in the low 8s without further bleeding noted. MD OK with resuming heparin without bolus, and targeting lower end of therapeutic range. Heparin previously noted to be supratherapeutic with heparin running at 650 units/hour, and at high end of therapeutic range on 550 units/hour.    Goal of Therapy:  Heparin level 0.3-0.5 Monitor platelets by anticoagulation protocol: Yes   Plan:  Resume heparin at 450 units/hour Heparin level in 8 hours.  Daily heparin levels and CBC.  Monitor closely for signs and symptoms of bleeding.   Jani Gravel, PharmD Clinical Pharmacist  11/09/2022 10:30 AM

## 2022-11-09 NOTE — Progress Notes (Signed)
PROGRESS NOTE    Lindsey Huber  HYW:[id number] DOB: 1945-12-23 DOA: 11/02/2022 PCP: Collene Mares, PA   Brief Narrative:  Patient is a 77 yo F w/ A-fib on Xarelto, SSS/PPM, ILD/pulmonary fibrosis/lupus followed by [name], COPD, diastolic CHF, OSA, aortic, mitral, tricuspid valve disease s/p AVR/MV ring/TVR, CKD 3 AA presented to [facility] ED on 10/8 with GIB.   Patient recently admitted on 06/2022 with hematochezia and hemorrhagic shock.  Patient given Kcentra, vitamin K and pressors.CTA showing extravasation at the junction of descending and sigmoid colon and duodenal enteritis. Given 2 units of PRBCs. CTA abd/pelvis w/ extravasation from diverticulum in proximal sigmoid, consistent w/ diverticulr hemorrhage.  Admitted to PCCM, GI consulted.  Further details below.  Assessment & Plan:   Principal Problem:   Acute lower GI bleeding Active Problems:   Essential (primary) hypertension   Acquired hypothyroidism   Long term (current) use of anticoagulants   ABLA (acute blood loss anemia)   Seizure (HCC)  Acute blood loss anemia secondary to lower GI bleed likely secondary to diverticular bleed/ Hemorrhagic shock Patient is on Xarelto and presented with hemoglobin 9.6.  Given Kcentra.  Given vitamin K.  Status post 2 packed red blood cells.  Hemoglobin back to 11.6 then down to 9.5 yesterday.  Was started on heparin after GI clearance on 11/04/2022 with the plan to resume back Xarelto in 3 days.  However due to overdose of heparin leading to high PT, patient had another episode of rectal bleeding which was brief early morning 11/05/2022.  Hemoglobin dropped to 7.4, heparin held for an hour and resumed again.  Patient had 1-2 more black stools but no bright red blood per rectum.  Hemoglobin dropped to 6.7 on the morning of 11/06/2022, heparin stopped, 1 unit of transfusion given.  She had 1 more bowel movement the night before around 10 PM and according to her, there was some blood noted.   Although her hemoglobin remained stable however it seemed twice a decision to hold heparin for another 24 hours.  She has had another bowel movement but no blood, according to her.  We are now going to start her on heparin.  She is in agreement.  Plan to watch her for 48 hours on heparin and then transition to DOAC and discharge.  May need IR consultation for embolization if she were to have bleeding again.  GI signed off.   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   #HFpEF Most recent echo showing ejection fraction of 60 to 65%.  No acute concern for exacerbation at this time.  Will continue to monitor volume status.  Home medication includes furosemide 40 mg daily, metoprolol tartrate 12.5 mg twice daily. -Given softer blood pressures, holding this for now -Once patient has improved blood pressures can resume   # AKI on CKD stage IIIa Baseline creatinine around 1.2 with GFR of 47 but creatinine jumped to 2.12 11/04/2022, improved to 1.3 after 1 L of IV fluid.   #History of lupus Currently on prednisone 5 mg daily at home and hydroxychloroquine 200 mg daily. -Resume home prednisone and hydroxychloroquine   #History of seizures Patient is on Keppra 500 mg twice daily at home.  No acute concerns at this time for seizures. -Continue home Keppra 500 mg twice daily  Right lower extremity swelling: Has some pain.  Tylenol ordered.  Doppler ultrasound to rule out DVT.  DVT prophylaxis: SCDs Start:  11/02/22 2023   Code Status: Full Code  Family Communication: None present at bedside.  Plan of care discussed with patient in person and daughter over the phone in length and he/she verbalized understanding and agreed with it.  Status is: Inpatient Remains inpatient appropriate because: Needs 1 to 2 days of monitoring on heparin for any GI bleeding.  Estimated body mass index is 21.67  kg/m as calculated from the following:   Height as of this encounter: 5\' 3"  (1.6 m).   Weight as of this encounter: 55.5 kg.    Nutritional Assessment: Body mass index is 21.67 kg/m.Marland Kitchen Seen by dietician.  I agree with the assessment and plan as outlined below: Nutrition Status:        . Skin Assessment: I have examined the patient's skin and I agree with the wound assessment as performed by the wound care RN as outlined below:    Consultants:  GI  Procedures:  None  Antimicrobials:  Anti-infectives (From admission, onward)    Start     Dose/Rate Route Frequency Ordered Stop   11/03/22 1330  hydroxychloroquine (PLAQUENIL) tablet 200 mg        200 mg Oral Daily 11/03/22 1235           Subjective: Patient seen and examined.  Doing well.  She says that she had 1 bowel movement yesterday and there was no blood.  She complains of right lower extremity pain and has some swelling.  Objective: Vitals:   11/08/22 1544 11/08/22 2057 11/09/22 0505 11/09/22 0928  BP: 115/64 (!) 105/58 116/63 116/69  Pulse: 70 70 70 70  Resp: 16   18  Temp: 98.8 F (37.1 C) 98.8 F (37.1 C) 98.2 F (36.8 C) 98 F (36.7 C)  TempSrc: Oral Oral Oral   SpO2: 99% 100% 99% 100%  Weight:      Height:        Intake/Output Summary (Last 24 hours) at 11/09/2022 1139 Last data filed at 11/09/2022 0512 Gross per 24 hour  Intake --  Output 0 ml  Net 0 ml   Filed Weights   11/06/22 0500 11/07/22 2100 11/08/22 0722  Weight: 55.2 kg 56.5 kg 55.5 kg    Examination:  General exam: Appears calm and comfortable  Respiratory system: Clear to auscultation. Respiratory effort normal. Cardiovascular system: S1 & S2 heard, RRR. No JVD, murmurs, rubs, gallops or clicks. No pedal edema. Gastrointestinal system: Abdomen is nondistended, soft and nontender. No organomegaly or masses felt. Normal bowel sounds heard. Central nervous system: Alert and oriented. No focal neurological  deficits. Extremities: Symmetric 5 x 5 power. Skin: No rashes, lesions or ulcers.  Psychiatry: Judgement and insight appear normal. Mood & affect appropriate.   Data Reviewed: I have personally reviewed following labs and imaging studies  CBC: Recent Labs  Lab 11/02/22 1523 11/03/22 0031 11/07/22 0855 11/07/22 1638 11/08/22 0615 11/08/22 1842 11/09/22 0358  WBC 11.1*   < > 8.3 7.7 7.9 9.8 7.9  NEUTROABS 7.7  --   --   --   --   --   --   HGB 9.6*   < > 8.1* 8.3* 8.0* 8.0* 8.1*  HCT 30.1*   < > 23.7* 24.1* 24.4* 23.7* 24.3*  MCV 102.4*   < > 94.8 94.9 96.4 96.3 97.2  PLT 186   < > 118* 125* 118* 128* 124*   < > = values in this interval not displayed.   Basic Metabolic Panel: Recent Labs  Lab  11/02/22 1523 11/03/22 0222 11/04/22 0307 11/05/22 1150  NA 137 140 131* 132*  K 3.5 4.2 3.7 3.7  CL 103 102 95* 99  CO2 26 23 25 23   GLUCOSE 142* 102* 114* 97  BUN 26* 31* 38* 29*  CREATININE 1.33* 1.20* 2.12* 1.34*  CALCIUM 8.6* 8.7* 8.1* 8.2*   GFR: Estimated Creatinine Clearance: 30 mL/min (A) (by C-G formula based on SCr of 1.34 mg/dL (H)). Liver Function Tests: Recent Labs  Lab 11/02/22 1523  AST 26  ALT 20  ALKPHOS 71  BILITOT 0.6  PROT 6.5  ALBUMIN 2.7*   No results for input(s): "LIPASE", "AMYLASE" in the last 168 hours. No results for input(s): "AMMONIA" in the last 168 hours. Coagulation Profile: No results for input(s): "INR", "PROTIME" in the last 168 hours. Cardiac Enzymes: No results for input(s): "CKTOTAL", "CKMB", "CKMBINDEX", "TROPONINI" in the last 168 hours. BNP (last 3 results) No results for input(s): "PROBNP" in the last 8760 hours. HbA1C: No results for input(s): "HGBA1C" in the last 72 hours. CBG: Recent Labs  Lab 11/03/22 1128 11/03/22 1925 11/03/22 2320 11/04/22 0332 11/04/22 0753  GLUCAP 87 180* 114* 112* 109*   Lipid Profile: No results for input(s): "CHOL", "HDL", "LDLCALC", "TRIG", "CHOLHDL", "LDLDIRECT" in the last 72  hours. Thyroid Function Tests: No results for input(s): "TSH", "T4TOTAL", "FREET4", "T3FREE", "THYROIDAB" in the last 72 hours. Anemia Panel: No results for input(s): "VITAMINB12", "FOLATE", "FERRITIN", "TIBC", "IRON", "RETICCTPCT" in the last 72 hours. Sepsis Labs: Recent Labs  Lab 11/02/22 1644  LATICACIDVEN 2.0*    Recent Results (from the past 240 hour(s))  MRSA Next Gen by PCR, Nasal     Status: None   Collection Time: 11/02/22 10:12 PM   Specimen: Nasal Mucosa; Nasal Swab  Result Value Ref Range Status   MRSA by PCR Next Gen NOT DETECTED NOT DETECTED Final    Comment: (NOTE) The GeneXpert MRSA Assay (FDA approved for NASAL specimens only), is one component of a comprehensive MRSA colonization surveillance program. It is not intended to diagnose MRSA infection nor to guide or monitor treatment for MRSA infections. Test performance is not FDA approved in patients less than 53 years old. Performed at [facility], [address] [place], [place] [address]      Radiology Studies: No results found.  Scheduled Meds:  allopurinol  100 mg Oral Daily   fluticasone furoate-vilanterol  1 puff Inhalation Daily   And   umeclidinium bromide  1 puff Inhalation Daily   hydroxychloroquine  200 mg Oral Daily   levETIRAcetam  500 mg Oral BID   levothyroxine  25 mcg Oral Daily   pantoprazole (PROTONIX) IV  40 mg Intravenous Q12H   predniSONE  5 mg Oral Q breakfast   Continuous Infusions:  heparin        LOS: 7 days   Hughie Closs, MD [facility]  11/09/2022, 11:39 AM   *Please note that this is a verbal dictation therefore any spelling or grammatical errors are due to the "Dragon [employer] One" system interpretation.  Please page via Amion and do not message via secure chat for urgent patient care matters. Secure chat can be used for non urgent patient care matters.  How to contact the [facility] Attending or Consulting provider 7A - 7P or covering provider during  after hours 7P -7A, for this patient?  Check the care team in [facility] and look for a) attending/consulting [facility] provider listed and b) the [facility] team listed. Page or  secure chat 7A-7P. Log into [email or web address] and use [place]'s universal password to access. If you do not have the password, please contact the hospital operator. Locate the [facility] provider you are looking for under [facility] and page to a number that you can be directly reached. If you still have difficulty reaching the provider, please page the [facility] (Director on Call) for the Hospitalists listed on amion for assistance.

## 2022-11-09 NOTE — Plan of Care (Signed)
°  Problem: Education: Goal: Knowledge of General Education information will improve Description: Including pain rating scale, medication(s)/side effects and non-pharmacologic comfort measures Outcome: Completed/Met

## 2022-11-09 NOTE — Progress Notes (Addendum)
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   Allergies  Allergen Reactions   Other Other (See Comments)    Patient is to NOT EAT any foods with husks or tree nuts, popcorn   Propofol Shortness Of Breath and Other (See Comments)    "Caused asthma"   Flecainide Other (See Comments)    Reaction not recalled   Amiodarone Other (See Comments)    Delirium/Confusion/Psychosis   Amlodipine Other (See Comments)    "Tired and syncope"    Patient Measurements: Height: 5\' 3"  (160 cm) Weight: 55.5 kg (122 lb 5.7 oz) IBW/kg (Calculated) : 52.4 Heparin Dosing Weight: 54.7 kg  Vital Signs: Temp: 98.4 F (36.9 C) (10/15 1705) BP: 128/69 (10/15 1705) Pulse Rate: 71 (10/15 1705)  Labs: Recent Labs    11/08/22 0615 11/08/22 1842 11/09/22 0358 11/09/22 1854  HGB 8.0* 8.0* 8.1*  --   HCT 24.4* 23.7* 24.3*  --   PLT 118* 128* 124*  --   HEPARINUNFRC  --   --   --  0.11*    Estimated Creatinine Clearance: 30 mL/min (A) (by C-G formula based on SCr of 1.34 mg/dL (H)).   Medical History: Past Medical History:  Diagnosis Date   Achalasia    Acquired hypothyroidism 08/20/2020   Acute metabolic encephalopathy 07/25/2021   AICD (automatic cardioverter/defibrillator) present 11/14/2019   Allergic rhinitis    Ankle fracture 03/25/2022   Aortic valve disorder 03/26/2002   Atherosclerosis of abdominal aorta (HCC) 05/01/2020   Cardiomyopathy (HCC)    CHB (complete heart block) (HCC) 08/18/2017   CHF (congestive heart failure) (HCC)    Cholelithiasis without obstruction 05/01/2020   Chronic gouty arthritis    Chronic heart failure with preserved ejection fraction (HCC) 05/01/2020   Chronic kidney disease (CKD), active medical management without dialysis, stage 3 (moderate) (HCC) 05/01/2020   Closed torus fracture of distal end of right radius with delayed healing 08/12/2021    Delusional thoughts (HCC)    Diverticulosis of colon 05/01/2020   Epistaxis    Essential (primary) hypertension 08/18/2017   Gastroesophageal reflux disease 05/01/2020   Gastrointestinal hemorrhage    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 11/14/2019   Hypercoagulability due to atrial fibrillation (HCC) 05/01/2020   Hyperlipidemia 11/14/2019   Hypocalcemia 12/14/2020   Hypoglycemia    Hypokalemia 12/14/2020   Hypomagnesemia 12/14/2020   Hypotension 12/14/2020   Idiopathic pulmonary fibrosis (HCC) 05/01/2020   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   Iron deficiency anemia    Long term (current) use of anticoagulants    Malnutrition of mild degree [name]: 75% to less than 90% of standard weight) (HCC)    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/12/2021   Mitral and aortic incompetence 11/14/2019   Non-rheumatic atrial fibrillation (HCC) 05/01/2020   Non-toxic multinodular goiter 08/20/2020   NSVT (nonsustained ventricular tachycardia) (HCC) 08/18/2017   Oropharyngeal dysphagia    Osteoarthritis of hip 05/01/2020   Osteoarthritis of knee 05/01/2020   Osteopenia of neck of left femur    Pain of left hip joint 12/12/2017   Proteinuria 05/01/2020   Raynaud's disease    Recurrent falls 04/09/2021   Rheumatoid arthritis (HCC)    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   Septic shock (HCC) 03/10/2020   Slow transit constipation    Systemic lupus erythematosus (HCC) 05/01/2020   Thrombophilia (HCC)    Transient ischemic attack    Tricuspid regurgitation  05/01/2020   Vitamin D deficiency     Medications:  Scheduled:   allopurinol  100 mg Oral Daily   fluticasone furoate-vilanterol  1 puff Inhalation Daily   And   umeclidinium bromide  1 puff Inhalation Daily   hydroxychloroquine  200 mg Oral Daily   levETIRAcetam  500 mg Oral BID   levothyroxine  25 mcg Oral Daily   pantoprazole (PROTONIX) IV  40 mg Intravenous Q12H    predniSONE  5 mg Oral Q breakfast   Infusions:   heparin 450 Units/hr (11/09/22 1226)     Assessment: 77 years of age female with history of multi-vavular disease with bioprosthetic valves and atrial fibrillation on Xarelto prior to admission who presented with diverticular bleed. Xarelto was held (last dose 10/7) and Kcentra 25 units/kg and 1 unit PRBC given on 10/8 PM.   Heparin was held 10/12 AM d/t concern for recurrent diverticular bleed with blood noted in stool on multiple occasions- last observed 10/13 PM per MD note. After multiple CBCs, patient's Hgb has remained stable in the low 8s without further bleeding noted. 10/15 MD OK with resuming heparin without bolus, and targeting lower end of therapeutic range. Heparin previously became supratherapeutic with heparin running at 750 units/hour, and remained above lower goal range on 550 units/hr.   PM update: heparin level is subtherapeutic at 0.11 this evening. Was started at 1226, level taken at 1854. < 8 hr level, will increase slightly and try for an 8 hour level. No issues with infusion or bleeding per RN   Goal of Therapy:  Heparin level 0.3-0.5 Monitor platelets by anticoagulation protocol: Yes   Plan:  Increase heparin to 500 units/hour Heparin level in 8 hours.  Daily heparin levels and CBC.  Monitor closely for signs and symptoms of bleeding.    Thank you for allowing pharmacy to be a part of this patient's care.   Signe Colt, PharmD 11/09/2022 8:28 PM  **Pharmacist phone directory can be found on [email or web address] listed under [facility]**

## 2022-11-10 ENCOUNTER — Inpatient Hospital Stay (HOSPITAL_COMMUNITY): Payer: Medicare HMO

## 2022-11-10 DIAGNOSIS — Z952 Presence of prosthetic heart valve: Secondary | ICD-10-CM | POA: Diagnosis not present

## 2022-11-10 DIAGNOSIS — Z5181 Encounter for therapeutic drug level monitoring: Secondary | ICD-10-CM

## 2022-11-10 DIAGNOSIS — Z7901 Long term (current) use of anticoagulants: Secondary | ICD-10-CM

## 2022-11-10 DIAGNOSIS — Z95810 Presence of automatic (implantable) cardiac defibrillator: Secondary | ICD-10-CM

## 2022-11-10 DIAGNOSIS — R609 Edema, unspecified: Secondary | ICD-10-CM

## 2022-11-10 DIAGNOSIS — I482 Chronic atrial fibrillation, unspecified: Secondary | ICD-10-CM | POA: Diagnosis not present

## 2022-11-10 DIAGNOSIS — K922 Gastrointestinal hemorrhage, unspecified: Secondary | ICD-10-CM | POA: Diagnosis not present

## 2022-11-10 DIAGNOSIS — Z9889 Other specified postprocedural states: Secondary | ICD-10-CM

## 2022-11-10 LAB — TYPE AND SCREEN
ABO/RH(D): A NEG
Antibody Screen: POSITIVE
Unit division: 0
Unit division: 0

## 2022-11-10 LAB — HEPARIN LEVEL (UNFRACTIONATED)
Heparin Unfractionated: 0.13 [IU]/mL — ABNORMAL LOW (ref 0.30–0.70)
Heparin Unfractionated: 0.25 [IU]/mL — ABNORMAL LOW (ref 0.30–0.70)

## 2022-11-10 LAB — BPAM RBC
Blood Product Expiration Date: 202410232359
Blood Product Expiration Date: 202410302359
ISSUE DATE / TIME: 202410121231
Unit Type and Rh: 600
Unit Type and Rh: 600

## 2022-11-10 LAB — COMPREHENSIVE METABOLIC PANEL
ALT: 16 U/L (ref 0–44)
AST: 28 U/L (ref 15–41)
Albumin: 2.4 g/dL — ABNORMAL LOW (ref 3.5–5.0)
Alkaline Phosphatase: 67 U/L (ref 38–126)
Anion gap: 11 (ref 5–15)
BUN: 21 mg/dL (ref 8–23)
CO2: 20 mmol/L — ABNORMAL LOW (ref 22–32)
Calcium: 8.8 mg/dL — ABNORMAL LOW (ref 8.9–10.3)
Chloride: 105 mmol/L (ref 98–111)
Creatinine, Ser: 1.14 mg/dL — ABNORMAL HIGH (ref 0.44–1.00)
GFR, Estimated: 50 mL/min — ABNORMAL LOW (ref >60)
Glucose, Bld: 104 mg/dL — ABNORMAL HIGH (ref 70–99)
Potassium: 4.5 mmol/L (ref 3.5–5.1)
Sodium: 136 mmol/L (ref 135–145)
Total Bilirubin: 0.4 mg/dL (ref 0.3–1.2)
Total Protein: 6.1 g/dL — ABNORMAL LOW (ref 6.5–8.1)

## 2022-11-10 LAB — CBC
HCT: 24.6 % — ABNORMAL LOW (ref 36.0–46.0)
Hemoglobin: 7.9 g/dL — ABNORMAL LOW (ref 12.0–15.0)
MCH: 31 pg (ref 26.0–34.0)
MCHC: 32.1 g/dL (ref 30.0–36.0)
MCV: 96.5 fL (ref 80.0–100.0)
Platelets: 154 10*3/uL (ref 150–400)
RBC: 2.55 MIL/uL — ABNORMAL LOW (ref 3.87–5.11)
RDW: 16.8 % — ABNORMAL HIGH (ref 11.5–15.5)
WBC: 8.5 10*3/uL (ref 4.0–10.5)
nRBC: 0.4 % — ABNORMAL HIGH (ref 0.0–0.2)

## 2022-11-10 NOTE — Plan of Care (Signed)
  Problem: Health Behavior/Discharge Planning: Goal: Ability to manage health-related needs will improve Outcome: Completed/Met

## 2022-11-10 NOTE — TOC Progression Note (Addendum)
Transition of Care [facility]) - Progression Note    Patient Details  Name: Lindsey Huber MRN: [id number] Date of Birth: 1945-06-20  [facility] CM/SW Contact  Tom-Johnson, Hershal Coria, RN Phone Number: 11/10/2022, 3:44 PM  Clinical Narrative:     Patient restarted on Heparin gtt yesterday 11/09/22 after episode of Rectal Bleed 11/04/22 and Heparin was placed on hold. Cardiology consulted. Patient on Xarelto at home for A-Fib. Has received 3U PRBC this admission.    Patient not Medically ready for discharge.  CM will continue to follow as patient progresses with care towards discharge.        Expected Discharge Plan and Services                                               Social Determinants of Health (SDOH) Interventions SDOH Screenings   Food Insecurity: No Food Insecurity (11/02/2022)  Housing: Low Risk  (11/02/2022)  Transportation Needs: No Transportation Needs (11/02/2022)  Utilities: Not At Risk (11/02/2022)  Tobacco Use: Low Risk  (11/02/2022)    Readmission Risk Interventions    11/05/2022   10:54 AM 07/05/2022   12:36 PM 12/27/2021    3:07 PM  Readmission Risk Prevention Plan  Transportation Screening Complete Complete Complete  PCP or Specialist Appt within 3-5 Days  Complete Complete  HRI or Home Care Consult  Complete Complete  Social Work Consult for Recovery Care Planning/Counseling  Complete Complete  Palliative Care Screening  Not Applicable Complete  Medication Review [employer]) Referral to Pharmacy Referral to Pharmacy Complete  PCP or Specialist appointment within 3-5 days of discharge Complete    HRI or Home Care Consult Complete    SW Recovery Care/Counseling Consult Complete    Palliative Care Screening Not Applicable    Skilled Nursing Facility Not Applicable

## 2022-11-10 NOTE — Progress Notes (Signed)
RLE venous duplex has been completed.    Results can be found under chart review under CV PROC. 11/10/2022 11:15 AM Kayda Allers RVT, RDMS

## 2022-11-10 NOTE — Progress Notes (Addendum)
PROGRESS NOTE    Lindsey Huber  WGN:[id number] DOB: 04-Sep-1945 DOA: 11/02/2022 PCP: Collene Mares, PA   Brief Narrative: 77 year old with past medical history significant for A-fib on Xarelto, status post PPM, ILD, pulmonary fibrosis, lupus followed by 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  Patient given Kcentra, vitamin K and pressors.  CTA showed extravasation at the junction of the descending and sigmoid colon and duodenal enteritis.  She received 2 unit of packed red blood cell.  CT abdomen and pelvis with extravasation from diverticulum in the proximal sigmoid consistent with diverticular hemorrhage.  Admitted to CCM, GI consulted.     Assessment & Plan:   Principal Problem:   Acute lower GI bleeding Active Problems:   Essential (primary) hypertension   Acquired hypothyroidism   Long term (current) use of anticoagulants   ABLA (acute blood loss anemia)   Seizure (HCC)   1-Acute blood loss anemia secondary to GI bleed secondary to diverticular bleed, hemorrhagic shock: -In the setting of anticoagulation.  -Received Kcentra, Vitamin K. This hospitalization.  -Received 3 Units PRBC>  -Initially anticoagulation discontinue, subsequently started on heparin gtt after clearance by GI on 10/10 subsequently had another episode of rectal bleeding. Anticoagulation discontinue. . Back on Heparin since 10/15, monitor for bleeding. I have consulted cardiology for recommendations in this setting.   Paroxysmal A-fib; On Xarelto for A fib. Out patient.  Back on heparin gtt.  Monitor for bleeding.   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   Hyponatremia; monitor.   HFpEF:  Compensated.  Metoprolol and lasix on hold due to low soft BP  AKI on CKD stage IIIa:  Monitor renal function.  Cr baseline  1.2 Cr Peak to 2 on 10/10, improved with fluids.   History of Lupus:  On plaquenil and low dose prednisone.   History of Seizure:  Continue with Keppra.   Right LE swelling:  Doppler negative for DVT       Estimated body mass index is 21.67 kg/m as calculated from the following:   Height as of this encounter: 5\' 3"  (1.6 m).   Weight as of this encounter: 55.5 kg.   DVT prophylaxis: on heparin  Code Status: full code Family Communication: Disposition Plan:  Status is: Inpatient Remains inpatient appropriate because: management of GI bleeding.     Consultants:  GI Cardiology   Procedures:    Antimicrobials:    Subjective: She is alert, conversant. Last bloody stool small amount was Monday 10/14 Denies bloody bm  Objective: Vitals:   11/09/22 0928 11/09/22 1705 11/09/22 2030 11/10/22 0525  BP: 116/69 128/69 117/64 117/67  Pulse: 70 71 69 68  Resp: 18 18    Temp: 98 F (36.7 C) 98.4 F (36.9 C) 98.2 F (36.8 C) 97.8 F (36.6 C)  TempSrc:   Oral Oral  SpO2: 100% 100% 100% 100%  Weight:      Height:        Intake/Output Summary (Last 24 hours) at 11/10/2022 0725 Last data filed at 11/10/2022 0600 Gross per 24 hour  Intake 503.48 ml  Output 900 ml  Net -396.52 ml   Filed Weights   11/06/22 0500 11/07/22 2100 11/08/22 0722  Weight: 55.2 kg 56.5 kg 55.5 kg    Examination:  General exam: Appears calm and comfortable  Respiratory system: Clear to auscultation.  Respiratory effort normal. Cardiovascular system: S1 & S2 heard, RRR. , Murmur resent  Gastrointestinal system: Abdomen is nondistended, soft and nontender. No organomegaly or masses felt. Normal bowel sounds heard. Central nervous system: Alert and oriented. No focal neurological deficits. Extremities: Symmetric 5 x 5 power.   Data Reviewed: I have personally reviewed following labs and imaging studies  CBC: Recent Labs  Lab 11/07/22 0855 11/07/22 1638 11/08/22 0615  11/08/22 1842 11/09/22 0358  WBC 8.3 7.7 7.9 9.8 7.9  HGB 8.1* 8.3* 8.0* 8.0* 8.1*  HCT 23.7* 24.1* 24.4* 23.7* 24.3*  MCV 94.8 94.9 96.4 96.3 97.2  PLT 118* 125* 118* 128* 124*   Basic Metabolic Panel: Recent Labs  Lab 11/04/22 0307 11/05/22 1150  NA 131* 132*  K 3.7 3.7  CL 95* 99  CO2 25 23  GLUCOSE 114* 97  BUN 38* 29*  CREATININE 2.12* 1.34*  CALCIUM 8.1* 8.2*   GFR: Estimated Creatinine Clearance: 30 mL/min (A) (by C-G formula based on SCr of 1.34 mg/dL (H)). Liver Function Tests: No results for input(s): "AST", "ALT", "ALKPHOS", "BILITOT", "PROT", "ALBUMIN" in the last 168 hours. No results for input(s): "LIPASE", "AMYLASE" in the last 168 hours. No results for input(s): "AMMONIA" in the last 168 hours. Coagulation Profile: No results for input(s): "INR", "PROTIME" in the last 168 hours. Cardiac Enzymes: No results for input(s): "CKTOTAL", "CKMB", "CKMBINDEX", "TROPONINI" in the last 168 hours. BNP (last 3 results) No results for input(s): "PROBNP" in the last 8760 hours. HbA1C: No results for input(s): "HGBA1C" in the last 72 hours. CBG: Recent Labs  Lab 11/03/22 1128 11/03/22 1925 11/03/22 2320 11/04/22 0332 11/04/22 0753  GLUCAP 87 180* 114* 112* 109*   Lipid Profile: No results for input(s): "CHOL", "HDL", "LDLCALC", "TRIG", "CHOLHDL", "LDLDIRECT" in the last 72 hours. Thyroid Function Tests: No results for input(s): "TSH", "T4TOTAL", "FREET4", "T3FREE", "THYROIDAB" in the last 72 hours. Anemia Panel: No results for input(s): "VITAMINB12", "FOLATE", "FERRITIN", "TIBC", "IRON", "RETICCTPCT" in the last 72 hours. Sepsis Labs: No results for input(s): "PROCALCITON", "LATICACIDVEN" in the last 168 hours.  Recent Results (from the past 240 hour(s))  MRSA Next Gen by PCR, Nasal     Status: None   Collection Time: 11/02/22 10:12 PM   Specimen: Nasal Mucosa; Nasal Swab  Result Value Ref Range Status   MRSA by PCR Next Gen NOT DETECTED NOT DETECTED Final     Comment: (NOTE) The GeneXpert MRSA Assay (FDA approved for NASAL specimens only), is one component of a comprehensive MRSA colonization surveillance program. It is not intended to diagnose MRSA infection nor to guide or monitor treatment for MRSA infections. Test performance is not FDA approved in patients less than 8 years old. Performed at [facility], [address] [place], [place] [address]          Radiology Studies: No results found.      Scheduled Meds:  allopurinol  100 mg Oral Daily   fluticasone furoate-vilanterol  1 puff Inhalation Daily   And   umeclidinium bromide  1 puff Inhalation Daily   hydroxychloroquine  200 mg Oral Daily   levETIRAcetam  500 mg Oral BID   levothyroxine  25 mcg Oral Daily   pantoprazole (PROTONIX) IV  40 mg Intravenous Q12H   predniSONE  5 mg Oral Q breakfast   Continuous Infusions:  heparin 500 Units/hr (11/09/22 2102)     LOS: 8 days    Time spent: 35 minutes    Sumeet Geter A Zabdi Mis,  MD [facility]   If 7PM-7AM, please contact night-coverage [email or web address]  11/10/2022, 7:25 AM

## 2022-11-10 NOTE — Progress Notes (Signed)
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   Allergies  Allergen Reactions   Other Other (See Comments)    Patient is to NOT EAT any foods with husks or tree nuts, popcorn   Propofol Shortness Of Breath and Other (See Comments)    "Caused asthma"   Flecainide Other (See Comments)    Reaction not recalled   Amiodarone Other (See Comments)    Delirium/Confusion/Psychosis   Amlodipine Other (See Comments)    "Tired and syncope"    Patient Measurements: Height: 5\' 3"  (160 cm) Weight: 55.5 kg (122 lb 5.7 oz) IBW/kg (Calculated) : 52.4 Heparin Dosing Weight: 54.7 kg  Vital Signs: Temp: 98.2 F (36.8 C) (10/16 2007) Temp Source: Oral (10/16 2007) BP: 131/66 (10/16 2007) Pulse Rate: 71 (10/16 2007)  Labs: Recent Labs    11/08/22 1842 11/09/22 0358 11/09/22 1854 11/10/22 0946 11/10/22 2044  HGB 8.0* 8.1*  --  7.9*  --   HCT 23.7* 24.3*  --  24.6*  --   PLT 128* 124*  --  154  --   HEPARINUNFRC  --   --  0.11* 0.13* 0.25*    Estimated Creatinine Clearance: 30 mL/min (A) (by C-G formula based on SCr of 1.34 mg/dL (H)).   Medical History: Past Medical History:  Diagnosis Date   Achalasia    Acquired hypothyroidism 08/20/2020   Acute metabolic encephalopathy 07/25/2021   AICD (automatic cardioverter/defibrillator) present 11/14/2019   Allergic rhinitis    Ankle fracture 03/25/2022   Aortic valve disorder 03/26/2002   Atherosclerosis of abdominal aorta (HCC) 05/01/2020   Cardiomyopathy (HCC)    CHB (complete heart block) (HCC) 08/18/2017   CHF (congestive heart failure) (HCC)    Cholelithiasis without obstruction 05/01/2020   Chronic gouty arthritis    Chronic heart failure with preserved ejection fraction (HCC) 05/01/2020   Chronic kidney disease (CKD), active medical management without dialysis, stage 3 (moderate) (HCC) 05/01/2020   Closed torus  fracture of distal end of right radius with delayed healing 08/12/2021   Delusional thoughts (HCC)    Diverticulosis of colon 05/01/2020   Epistaxis    Essential (primary) hypertension 08/18/2017   Gastroesophageal reflux disease 05/01/2020   Gastrointestinal hemorrhage    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 11/14/2019   Hypercoagulability due to atrial fibrillation (HCC) 05/01/2020   Hyperlipidemia 11/14/2019   Hypocalcemia 12/14/2020   Hypoglycemia    Hypokalemia 12/14/2020   Hypomagnesemia 12/14/2020   Hypotension 12/14/2020   Idiopathic pulmonary fibrosis (HCC) 05/01/2020   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   Iron deficiency anemia    Long term (current) use of anticoagulants    Malnutrition of mild degree [name]: 75% to less than 90% of standard weight) (HCC)    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/12/2021   Mitral and aortic incompetence 11/14/2019   Non-rheumatic atrial fibrillation (HCC) 05/01/2020   Non-toxic multinodular goiter 08/20/2020   NSVT (nonsustained ventricular tachycardia) (HCC) 08/18/2017   Oropharyngeal dysphagia    Osteoarthritis of hip 05/01/2020   Osteoarthritis of knee 05/01/2020   Osteopenia of neck of left femur    Pain of left hip joint 12/12/2017   Proteinuria 05/01/2020   Raynaud's disease    Recurrent falls 04/09/2021   Rheumatoid arthritis (HCC)    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   Septic shock (HCC) 03/10/2020   Slow transit constipation    Systemic lupus erythematosus (HCC) 05/01/2020  Thrombophilia (HCC)    Transient ischemic attack    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   Vitamin D deficiency     Medications:  Scheduled:   allopurinol  100 mg Oral Daily   fluticasone furoate-vilanterol  1 puff Inhalation Daily   And   umeclidinium bromide  1 puff Inhalation Daily   hydroxychloroquine  200 mg Oral Daily   levETIRAcetam  500 mg Oral BID    levothyroxine  25 mcg Oral Daily   pantoprazole (PROTONIX) IV  40 mg Intravenous Q12H   predniSONE  5 mg Oral Q breakfast   Infusions:   heparin 600 Units/hr (11/10/22 1501)     Assessment: 77 years of age female with history of multi-vavular disease with bioprosthetic valves and atrial fibrillation on Xarelto prior to admission who presented with diverticular bleed. Xarelto was held (last dose 10/7) and Kcentra 25 units/kg and 1 unit PRBC given on 10/8 PM. Heparin was held 10/12 AM d/t concern for recurrent diverticular bleed with blood noted in stool on multiple occasions- last observed 10/13 PM per MD note. After multiple CBCs, patient's Hgb has remained stable in the low 8s without further bleeding noted. MD OK with resuming heparin without bolus, and targeting lower end of therapeutic range.   10/16 PM: Heparin level subtherapeutic with heparin running at 600 units/hour. CMET with no gross abnormalities. No bleeding or issues with heparin infusion noted by RN tonight.   Goal of Therapy:  Heparin level 0.3-0.5 Monitor platelets by anticoagulation protocol: Yes   Plan:  Increase heparin to 650 units/hour Heparin level and CMET in 8 hours.  Daily heparin levels and CBC.  Monitor closely for signs and symptoms of bleeding.   Cedric Fishman, PharmD, BCPS, BCCCP Clinical Pharmacist

## 2022-11-10 NOTE — Progress Notes (Signed)
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   Allergies  Allergen Reactions   Other Other (See Comments)    Patient is to NOT EAT any foods with husks or tree nuts, popcorn   Propofol Shortness Of Breath and Other (See Comments)    "Caused asthma"   Flecainide Other (See Comments)    Reaction not recalled   Amiodarone Other (See Comments)    Delirium/Confusion/Psychosis   Amlodipine Other (See Comments)    "Tired and syncope"    Patient Measurements: Height: 5\' 3"  (160 cm) Weight: 55.5 kg (122 lb 5.7 oz) IBW/kg (Calculated) : 52.4 Heparin Dosing Weight: 54.7 kg  Vital Signs: Temp: 96.6 F (35.9 C) (10/16 0902) Temp Source: Oral (10/16 0525) BP: 117/66 (10/16 0902) Pulse Rate: 70 (10/16 0902)  Labs: Recent Labs    11/08/22 1842 11/09/22 0358 11/09/22 1854 11/10/22 0946  HGB 8.0* 8.1*  --  7.9*  HCT 23.7* 24.3*  --  24.6*  PLT 128* 124*  --  154  HEPARINUNFRC  --   --  0.11* 0.13*    Estimated Creatinine Clearance: 30 mL/min (A) (by C-G formula based on SCr of 1.34 mg/dL (H)).   Medical History: Past Medical History:  Diagnosis Date   Achalasia    Acquired hypothyroidism 08/20/2020   Acute metabolic encephalopathy 07/25/2021   AICD (automatic cardioverter/defibrillator) present 11/14/2019   Allergic rhinitis    Ankle fracture 03/25/2022   Aortic valve disorder 03/26/2002   Atherosclerosis of abdominal aorta (HCC) 05/01/2020   Cardiomyopathy (HCC)    CHB (complete heart block) (HCC) 08/18/2017   CHF (congestive heart failure) (HCC)    Cholelithiasis without obstruction 05/01/2020   Chronic gouty arthritis    Chronic heart failure with preserved ejection fraction (HCC) 05/01/2020   Chronic kidney disease (CKD), active medical management without dialysis, stage 3 (moderate) (HCC) 05/01/2020   Closed torus fracture of distal end of right radius with  delayed healing 08/12/2021   Delusional thoughts (HCC)    Diverticulosis of colon 05/01/2020   Epistaxis    Essential (primary) hypertension 08/18/2017   Gastroesophageal reflux disease 05/01/2020   Gastrointestinal hemorrhage    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 11/14/2019   Hypercoagulability due to atrial fibrillation (HCC) 05/01/2020   Hyperlipidemia 11/14/2019   Hypocalcemia 12/14/2020   Hypoglycemia    Hypokalemia 12/14/2020   Hypomagnesemia 12/14/2020   Hypotension 12/14/2020   Idiopathic pulmonary fibrosis (HCC) 05/01/2020   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   Iron deficiency anemia    Long term (current) use of anticoagulants    Malnutrition of mild degree [name]: 75% to less than 90% of standard weight) (HCC)    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/12/2021   Mitral and aortic incompetence 11/14/2019   Non-rheumatic atrial fibrillation (HCC) 05/01/2020   Non-toxic multinodular goiter 08/20/2020   NSVT (nonsustained ventricular tachycardia) (HCC) 08/18/2017   Oropharyngeal dysphagia    Osteoarthritis of hip 05/01/2020   Osteoarthritis of knee 05/01/2020   Osteopenia of neck of left femur    Pain of left hip joint 12/12/2017   Proteinuria 05/01/2020   Raynaud's disease    Recurrent falls 04/09/2021   Rheumatoid arthritis (HCC)    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   Septic shock (HCC) 03/10/2020   Slow transit constipation    Systemic lupus erythematosus (HCC) 05/01/2020   Thrombophilia (HCC)    Transient ischemic attack  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   Vitamin D deficiency     Medications:  Scheduled:   allopurinol  100 mg Oral Daily   fluticasone furoate-vilanterol  1 puff Inhalation Daily   And   umeclidinium bromide  1 puff Inhalation Daily   hydroxychloroquine  200 mg Oral Daily   levETIRAcetam  500 mg Oral BID   levothyroxine  25 mcg Oral Daily   pantoprazole  (PROTONIX) IV  40 mg Intravenous Q12H   predniSONE  5 mg Oral Q breakfast   Infusions:   heparin 500 Units/hr (11/09/22 2102)     Assessment: 77 years of age female with history of multi-vavular disease with bioprosthetic valves and atrial fibrillation on Xarelto prior to admission who presented with diverticular bleed. Xarelto was held (last dose 10/7) and Kcentra 25 units/kg and 1 unit PRBC given on 10/8 PM. Heparin was held 10/12 AM d/t concern for recurrent diverticular bleed with blood noted in stool on multiple occasions- last observed 10/13 PM per MD note. After multiple CBCs, patient's Hgb has remained stable in the low 8s without further bleeding noted. MD OK with resuming heparin without bolus, and targeting lower end of therapeutic range.   10/16 AM: Heparin level subtherapeutic with heparin running at 500 units/hour. No bleeding or issues with heparin infusion noted by RN today. Patient was previously on high end of therapeutic range with heparin at 550 units/ hour following a decrease from 750 units/hour, possibly due to accumulation. Will cautiously uptitrate heparin to 600 units/hour. Will also plan to order CMET with next levels to ensure heparin level is not falsely decreased.    Goal of Therapy:  Heparin level 0.3-0.5 Monitor platelets by anticoagulation protocol: Yes   Plan:  Increase heparin to 600 units/hour Heparin level and CMET in 8 hours.  Daily heparin levels and CBC.  Monitor closely for signs and symptoms of bleeding.   Jani Gravel, PharmD Clinical Pharmacist  11/10/2022 11:20 AM

## 2022-11-10 NOTE — Consult Note (Signed)
Cardiology Consultation   Patient ID: Lindsey Huber MRN: [id number]; DOB: [birth year]  Admit date: 11/02/2022 Date of Consult: 11/10/2022  PCP:  Collene Mares, PA   [facility] Providers Cardiologist:  None  Electrophysiologist:  Lanier Prude, MD     Patient Profile:   Lindsey Huber is a 77 y.o. 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 Sunnie Nielsen.  History of Present Illness:   Lindsey Huber is a 77 year old female with past medical history noted above.  She underwent AVR/MV ring/TAVR 08/2010.  Had a paroxysmal VT and ICD placed due to torsades on flecainide with sick sinus syndrome requiring PPM which predated valve repair/replacement surgery.  ICD lead was damaged during valve replacement surgery.  Also with history of COPD and pulmonary fibrosis followed by pulmonology.  She was not a smoker but inhaled secondhand smoke from her husband and mother.   Seen in advanced heart failure clinic with Dr. [name] on 02/2022.  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 It was recommended that ideally she would undergo TEE but given her overall debility the risk was felt to be higher than benefit and would not likely be a good candidate for further valvular procedures.  Last seen by EP, Francis Dowse on 08/2022 and reported chest pain that was reproducible with palpation, no issues with GI bleeding.  CHA2DS2-VASc of 6, continued on Xarelto.  She was suppose to be seen in the AHF clinic on 10/1 but presented with confusion and AMS. She was sent to the ED.  Admitted with focal non-convulsive seizures and placed on Keppa. Echo during that admission showed LVEF of 60-65%, no rWMA, moderately reduced RV, severe biatrial enlargement, moderate to severe TR, moderate to severe TS.   Presented to the ED on 10/8 with complaints of hematochezia.  Received Kcentra, vitamin K and required IV pressor support.  CTA showed extravasation at the junction of descending and sigmoid colon with duodenal enteritis.  She received 2 units PRBCs with repeat CT abdomen pelvis with extravasation from diverticulum in the proximal sigmoid consistent with diverticular hemorrhage.  She was initially admitted to [facility] and seen by GI.  Anticoagulation was held on admission.  She was initially placed on IV heparin, but stopped with recurrent bleeding.  Has been since resumed on IV heparin with plans to monitor for 48 hours and then transition to DOAC if no recurrent bleeding.  There is mention of need of IR consultation for embolization if she were to rebleed.  Cardiology has been asked to weigh in regarding resumption of her anticoagulation.  In talking with the patient, she lives with her daughter. Uses a walker to get around mostly. Does have hx of freq falls, last one being about 2-3 weeks ago.   Past Medical History:  Diagnosis Date   Achalasia    Acquired hypothyroidism 08/20/2020   Acute metabolic encephalopathy 07/25/2021   AICD (automatic cardioverter/defibrillator) present 11/14/2019   Allergic rhinitis    Ankle fracture 03/25/2022   Aortic valve disorder 03/26/2002   Atherosclerosis of abdominal aorta (HCC) 05/01/2020   Cardiomyopathy (HCC)  Cardiology Consultation   Patient ID: Lindsey Huber MRN: [id number]; DOB: [birth year]  Admit date: 11/02/2022 Date of Consult: 11/10/2022  PCP:  Collene Mares, PA   [facility] Providers Cardiologist:  None  Electrophysiologist:  Lanier Prude, MD     Patient Profile:   Lindsey Huber is a 77 y.o. 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 Sunnie Nielsen.  History of Present Illness:   Lindsey Huber is a 77 year old female with past medical history noted above.  She underwent AVR/MV ring/TAVR 08/2010.  Had a paroxysmal VT and ICD placed due to torsades on flecainide with sick sinus syndrome requiring PPM which predated valve repair/replacement surgery.  ICD lead was damaged during valve replacement surgery.  Also with history of COPD and pulmonary fibrosis followed by pulmonology.  She was not a smoker but inhaled secondhand smoke from her husband and mother.   Seen in advanced heart failure clinic with Dr. [name] on 02/2022.  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 It was recommended that ideally she would undergo TEE but given her overall debility the risk was felt to be higher than benefit and would not likely be a good candidate for further valvular procedures.  Last seen by EP, Francis Dowse on 08/2022 and reported chest pain that was reproducible with palpation, no issues with GI bleeding.  CHA2DS2-VASc of 6, continued on Xarelto.  She was suppose to be seen in the AHF clinic on 10/1 but presented with confusion and AMS. She was sent to the ED.  Admitted with focal non-convulsive seizures and placed on Keppa. Echo during that admission showed LVEF of 60-65%, no rWMA, moderately reduced RV, severe biatrial enlargement, moderate to severe TR, moderate to severe TS.   Presented to the ED on 10/8 with complaints of hematochezia.  Received Kcentra, vitamin K and required IV pressor support.  CTA showed extravasation at the junction of descending and sigmoid colon with duodenal enteritis.  She received 2 units PRBCs with repeat CT abdomen pelvis with extravasation from diverticulum in the proximal sigmoid consistent with diverticular hemorrhage.  She was initially admitted to [facility] and seen by GI.  Anticoagulation was held on admission.  She was initially placed on IV heparin, but stopped with recurrent bleeding.  Has been since resumed on IV heparin with plans to monitor for 48 hours and then transition to DOAC if no recurrent bleeding.  There is mention of need of IR consultation for embolization if she were to rebleed.  Cardiology has been asked to weigh in regarding resumption of her anticoagulation.  In talking with the patient, she lives with her daughter. Uses a walker to get around mostly. Does have hx of freq falls, last one being about 2-3 weeks ago.   Past Medical History:  Diagnosis Date   Achalasia    Acquired hypothyroidism 08/20/2020   Acute metabolic encephalopathy 07/25/2021   AICD (automatic cardioverter/defibrillator) present 11/14/2019   Allergic rhinitis    Ankle fracture 03/25/2022   Aortic valve disorder 03/26/2002   Atherosclerosis of abdominal aorta (HCC) 05/01/2020   Cardiomyopathy (HCC)  Cardiology Consultation   Patient ID: Lindsey Huber MRN: [id number]; DOB: [birth year]  Admit date: 11/02/2022 Date of Consult: 11/10/2022  PCP:  Collene Mares, PA   [facility] Providers Cardiologist:  None  Electrophysiologist:  Lanier Prude, MD     Patient Profile:   Lindsey Huber is a 77 y.o. 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 Sunnie Nielsen.  History of Present Illness:   Lindsey Huber is a 77 year old female with past medical history noted above.  She underwent AVR/MV ring/TAVR 08/2010.  Had a paroxysmal VT and ICD placed due to torsades on flecainide with sick sinus syndrome requiring PPM which predated valve repair/replacement surgery.  ICD lead was damaged during valve replacement surgery.  Also with history of COPD and pulmonary fibrosis followed by pulmonology.  She was not a smoker but inhaled secondhand smoke from her husband and mother.   Seen in advanced heart failure clinic with Dr. [name] on 02/2022.  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 It was recommended that ideally she would undergo TEE but given her overall debility the risk was felt to be higher than benefit and would not likely be a good candidate for further valvular procedures.  Last seen by EP, Francis Dowse on 08/2022 and reported chest pain that was reproducible with palpation, no issues with GI bleeding.  CHA2DS2-VASc of 6, continued on Xarelto.  She was suppose to be seen in the AHF clinic on 10/1 but presented with confusion and AMS. She was sent to the ED.  Admitted with focal non-convulsive seizures and placed on Keppa. Echo during that admission showed LVEF of 60-65%, no rWMA, moderately reduced RV, severe biatrial enlargement, moderate to severe TR, moderate to severe TS.   Presented to the ED on 10/8 with complaints of hematochezia.  Received Kcentra, vitamin K and required IV pressor support.  CTA showed extravasation at the junction of descending and sigmoid colon with duodenal enteritis.  She received 2 units PRBCs with repeat CT abdomen pelvis with extravasation from diverticulum in the proximal sigmoid consistent with diverticular hemorrhage.  She was initially admitted to [facility] and seen by GI.  Anticoagulation was held on admission.  She was initially placed on IV heparin, but stopped with recurrent bleeding.  Has been since resumed on IV heparin with plans to monitor for 48 hours and then transition to DOAC if no recurrent bleeding.  There is mention of need of IR consultation for embolization if she were to rebleed.  Cardiology has been asked to weigh in regarding resumption of her anticoagulation.  In talking with the patient, she lives with her daughter. Uses a walker to get around mostly. Does have hx of freq falls, last one being about 2-3 weeks ago.   Past Medical History:  Diagnosis Date   Achalasia    Acquired hypothyroidism 08/20/2020   Acute metabolic encephalopathy 07/25/2021   AICD (automatic cardioverter/defibrillator) present 11/14/2019   Allergic rhinitis    Ankle fracture 03/25/2022   Aortic valve disorder 03/26/2002   Atherosclerosis of abdominal aorta (HCC) 05/01/2020   Cardiomyopathy (HCC)  dilitation.   Venous: The inferior vena cava is dilated in size with greater than 50%  respiratory  variability, suggesting right atrial pressure of 8 mmHg.   IAS/Shunts: No atrial level shunt detected by color flow Doppler.   Additional Comments: A device lead is visualized.       Laboratory Data:  High Sensitivity Troponin:  No results for input(s): "TROPONINIHS" in the last 720 hours.   Chemistry Recent Labs  Lab 11/04/22 0307 11/05/22 1150  NA 131* 132*  K 3.7 3.7  CL 95* 99  CO2 25 23  GLUCOSE 114* 97  BUN 38* 29*  CREATININE 2.12* 1.34*  CALCIUM 8.1* 8.2*  GFRNONAA 24* 41*  ANIONGAP 11 10    No results for input(s): "PROT", "ALBUMIN", "AST", "ALT", "ALKPHOS", "BILITOT" in the last 168 hours. Lipids No results for input(s): "CHOL", "TRIG", "HDL", "LABVLDL", "LDLCALC", "CHOLHDL" in the last 168 hours.  Hematology Recent Labs  Lab 11/08/22 1842 11/09/22 0358 11/10/22 0946  WBC 9.8 7.9 8.5  RBC 2.46* 2.50* 2.55*  HGB 8.0* 8.1* 7.9*  HCT 23.7* 24.3* 24.6*  MCV 96.3 97.2 96.5  MCH 32.5 32.4 31.0  MCHC 33.8 33.3 32.1  RDW 16.8* 16.6* 16.8*  PLT 128* 124* 154   Thyroid No results for input(s): "TSH", "FREET4" in the last 168 hours.  BNPNo results for input(s): "BNP", "PROBNP" in the last 168 hours.  DDimer No results for input(s): "DDIMER" in the last 168 hours.   Radiology/Studies:  VAS [place] LOWER EXTREMITY VENOUS (DVT)  Result Date: 11/10/2022  Lower Venous DVT Study Patient Name:  Lindsey Huber  Date of Exam:   11/10/2022 Medical Rec #: [id number]         Accession #:    [id number] Date of Birth: 04-12-1945         Patient Gender: F Patient Age:   58 years Exam Location:  [facility] Procedure:      VAS [place] LOWER EXTREMITY VENOUS (DVT) Referring Phys: RAVI PAHWANI --------------------------------------------------------------------------------  Indications: Edema.  Comparison Study: Previous exam on 07/21/2022 was negative for DVT Performing Technologist: Ernestene Mention RVT, RDMS  Examination Guidelines: A complete evaluation includes B-mode imaging, spectral  Doppler, color Doppler, and power Doppler as needed of all accessible portions of each vessel. Bilateral testing is considered an integral part of a complete examination. Limited examinations for reoccurring indications may be performed as noted. The reflux portion of the exam is performed with the patient in reverse Trendelenburg.  +---------+---------------+---------+-----------+----------+--------------+ RIGHT    CompressibilityPhasicitySpontaneityPropertiesThrombus Aging +---------+---------------+---------+-----------+----------+--------------+ CFV      Full           Yes      Yes                                 +---------+---------------+---------+-----------+----------+--------------+ SFJ      Full                                                        +---------+---------------+---------+-----------+----------+--------------+ FV Prox  Full           Yes      Yes                                 +---------+---------------+---------+-----------+----------+--------------+  dilitation.   Venous: The inferior vena cava is dilated in size with greater than 50%  respiratory  variability, suggesting right atrial pressure of 8 mmHg.   IAS/Shunts: No atrial level shunt detected by color flow Doppler.   Additional Comments: A device lead is visualized.       Laboratory Data:  High Sensitivity Troponin:  No results for input(s): "TROPONINIHS" in the last 720 hours.   Chemistry Recent Labs  Lab 11/04/22 0307 11/05/22 1150  NA 131* 132*  K 3.7 3.7  CL 95* 99  CO2 25 23  GLUCOSE 114* 97  BUN 38* 29*  CREATININE 2.12* 1.34*  CALCIUM 8.1* 8.2*  GFRNONAA 24* 41*  ANIONGAP 11 10    No results for input(s): "PROT", "ALBUMIN", "AST", "ALT", "ALKPHOS", "BILITOT" in the last 168 hours. Lipids No results for input(s): "CHOL", "TRIG", "HDL", "LABVLDL", "LDLCALC", "CHOLHDL" in the last 168 hours.  Hematology Recent Labs  Lab 11/08/22 1842 11/09/22 0358 11/10/22 0946  WBC 9.8 7.9 8.5  RBC 2.46* 2.50* 2.55*  HGB 8.0* 8.1* 7.9*  HCT 23.7* 24.3* 24.6*  MCV 96.3 97.2 96.5  MCH 32.5 32.4 31.0  MCHC 33.8 33.3 32.1  RDW 16.8* 16.6* 16.8*  PLT 128* 124* 154   Thyroid No results for input(s): "TSH", "FREET4" in the last 168 hours.  BNPNo results for input(s): "BNP", "PROBNP" in the last 168 hours.  DDimer No results for input(s): "DDIMER" in the last 168 hours.   Radiology/Studies:  VAS [place] LOWER EXTREMITY VENOUS (DVT)  Result Date: 11/10/2022  Lower Venous DVT Study Patient Name:  Lindsey Huber  Date of Exam:   11/10/2022 Medical Rec #: [id number]         Accession #:    [id number] Date of Birth: 04-12-1945         Patient Gender: F Patient Age:   58 years Exam Location:  [facility] Procedure:      VAS [place] LOWER EXTREMITY VENOUS (DVT) Referring Phys: RAVI PAHWANI --------------------------------------------------------------------------------  Indications: Edema.  Comparison Study: Previous exam on 07/21/2022 was negative for DVT Performing Technologist: Ernestene Mention RVT, RDMS  Examination Guidelines: A complete evaluation includes B-mode imaging, spectral  Doppler, color Doppler, and power Doppler as needed of all accessible portions of each vessel. Bilateral testing is considered an integral part of a complete examination. Limited examinations for reoccurring indications may be performed as noted. The reflux portion of the exam is performed with the patient in reverse Trendelenburg.  +---------+---------------+---------+-----------+----------+--------------+ RIGHT    CompressibilityPhasicitySpontaneityPropertiesThrombus Aging +---------+---------------+---------+-----------+----------+--------------+ CFV      Full           Yes      Yes                                 +---------+---------------+---------+-----------+----------+--------------+ SFJ      Full                                                        +---------+---------------+---------+-----------+----------+--------------+ FV Prox  Full           Yes      Yes                                 +---------+---------------+---------+-----------+----------+--------------+  dilitation.   Venous: The inferior vena cava is dilated in size with greater than 50%  respiratory  variability, suggesting right atrial pressure of 8 mmHg.   IAS/Shunts: No atrial level shunt detected by color flow Doppler.   Additional Comments: A device lead is visualized.       Laboratory Data:  High Sensitivity Troponin:  No results for input(s): "TROPONINIHS" in the last 720 hours.   Chemistry Recent Labs  Lab 11/04/22 0307 11/05/22 1150  NA 131* 132*  K 3.7 3.7  CL 95* 99  CO2 25 23  GLUCOSE 114* 97  BUN 38* 29*  CREATININE 2.12* 1.34*  CALCIUM 8.1* 8.2*  GFRNONAA 24* 41*  ANIONGAP 11 10    No results for input(s): "PROT", "ALBUMIN", "AST", "ALT", "ALKPHOS", "BILITOT" in the last 168 hours. Lipids No results for input(s): "CHOL", "TRIG", "HDL", "LABVLDL", "LDLCALC", "CHOLHDL" in the last 168 hours.  Hematology Recent Labs  Lab 11/08/22 1842 11/09/22 0358 11/10/22 0946  WBC 9.8 7.9 8.5  RBC 2.46* 2.50* 2.55*  HGB 8.0* 8.1* 7.9*  HCT 23.7* 24.3* 24.6*  MCV 96.3 97.2 96.5  MCH 32.5 32.4 31.0  MCHC 33.8 33.3 32.1  RDW 16.8* 16.6* 16.8*  PLT 128* 124* 154   Thyroid No results for input(s): "TSH", "FREET4" in the last 168 hours.  BNPNo results for input(s): "BNP", "PROBNP" in the last 168 hours.  DDimer No results for input(s): "DDIMER" in the last 168 hours.   Radiology/Studies:  VAS [place] LOWER EXTREMITY VENOUS (DVT)  Result Date: 11/10/2022  Lower Venous DVT Study Patient Name:  Lindsey Huber  Date of Exam:   11/10/2022 Medical Rec #: [id number]         Accession #:    [id number] Date of Birth: 04-12-1945         Patient Gender: F Patient Age:   58 years Exam Location:  [facility] Procedure:      VAS [place] LOWER EXTREMITY VENOUS (DVT) Referring Phys: RAVI PAHWANI --------------------------------------------------------------------------------  Indications: Edema.  Comparison Study: Previous exam on 07/21/2022 was negative for DVT Performing Technologist: Ernestene Mention RVT, RDMS  Examination Guidelines: A complete evaluation includes B-mode imaging, spectral  Doppler, color Doppler, and power Doppler as needed of all accessible portions of each vessel. Bilateral testing is considered an integral part of a complete examination. Limited examinations for reoccurring indications may be performed as noted. The reflux portion of the exam is performed with the patient in reverse Trendelenburg.  +---------+---------------+---------+-----------+----------+--------------+ RIGHT    CompressibilityPhasicitySpontaneityPropertiesThrombus Aging +---------+---------------+---------+-----------+----------+--------------+ CFV      Full           Yes      Yes                                 +---------+---------------+---------+-----------+----------+--------------+ SFJ      Full                                                        +---------+---------------+---------+-----------+----------+--------------+ FV Prox  Full           Yes      Yes                                 +---------+---------------+---------+-----------+----------+--------------+  dilitation.   Venous: The inferior vena cava is dilated in size with greater than 50%  respiratory  variability, suggesting right atrial pressure of 8 mmHg.   IAS/Shunts: No atrial level shunt detected by color flow Doppler.   Additional Comments: A device lead is visualized.       Laboratory Data:  High Sensitivity Troponin:  No results for input(s): "TROPONINIHS" in the last 720 hours.   Chemistry Recent Labs  Lab 11/04/22 0307 11/05/22 1150  NA 131* 132*  K 3.7 3.7  CL 95* 99  CO2 25 23  GLUCOSE 114* 97  BUN 38* 29*  CREATININE 2.12* 1.34*  CALCIUM 8.1* 8.2*  GFRNONAA 24* 41*  ANIONGAP 11 10    No results for input(s): "PROT", "ALBUMIN", "AST", "ALT", "ALKPHOS", "BILITOT" in the last 168 hours. Lipids No results for input(s): "CHOL", "TRIG", "HDL", "LABVLDL", "LDLCALC", "CHOLHDL" in the last 168 hours.  Hematology Recent Labs  Lab 11/08/22 1842 11/09/22 0358 11/10/22 0946  WBC 9.8 7.9 8.5  RBC 2.46* 2.50* 2.55*  HGB 8.0* 8.1* 7.9*  HCT 23.7* 24.3* 24.6*  MCV 96.3 97.2 96.5  MCH 32.5 32.4 31.0  MCHC 33.8 33.3 32.1  RDW 16.8* 16.6* 16.8*  PLT 128* 124* 154   Thyroid No results for input(s): "TSH", "FREET4" in the last 168 hours.  BNPNo results for input(s): "BNP", "PROBNP" in the last 168 hours.  DDimer No results for input(s): "DDIMER" in the last 168 hours.   Radiology/Studies:  VAS [place] LOWER EXTREMITY VENOUS (DVT)  Result Date: 11/10/2022  Lower Venous DVT Study Patient Name:  Lindsey Huber  Date of Exam:   11/10/2022 Medical Rec #: [id number]         Accession #:    [id number] Date of Birth: 04-12-1945         Patient Gender: F Patient Age:   58 years Exam Location:  [facility] Procedure:      VAS [place] LOWER EXTREMITY VENOUS (DVT) Referring Phys: RAVI PAHWANI --------------------------------------------------------------------------------  Indications: Edema.  Comparison Study: Previous exam on 07/21/2022 was negative for DVT Performing Technologist: Ernestene Mention RVT, RDMS  Examination Guidelines: A complete evaluation includes B-mode imaging, spectral  Doppler, color Doppler, and power Doppler as needed of all accessible portions of each vessel. Bilateral testing is considered an integral part of a complete examination. Limited examinations for reoccurring indications may be performed as noted. The reflux portion of the exam is performed with the patient in reverse Trendelenburg.  +---------+---------------+---------+-----------+----------+--------------+ RIGHT    CompressibilityPhasicitySpontaneityPropertiesThrombus Aging +---------+---------------+---------+-----------+----------+--------------+ CFV      Full           Yes      Yes                                 +---------+---------------+---------+-----------+----------+--------------+ SFJ      Full                                                        +---------+---------------+---------+-----------+----------+--------------+ FV Prox  Full           Yes      Yes                                 +---------+---------------+---------+-----------+----------+--------------+

## 2022-11-11 ENCOUNTER — Inpatient Hospital Stay (HOSPITAL_COMMUNITY)
Admission: RE | Admit: 2022-11-11 | Discharge: 2022-11-11 | Disposition: A | Discharge status: Home / Self Care | Payer: Medicare HMO | Source: Ambulatory Visit | Attending: Neurology | Admitting: Neurology

## 2022-11-11 ENCOUNTER — Other Ambulatory Visit (HOSPITAL_COMMUNITY): Payer: Self-pay

## 2022-11-11 DIAGNOSIS — Z952 Presence of prosthetic heart valve: Secondary | ICD-10-CM | POA: Diagnosis not present

## 2022-11-11 DIAGNOSIS — K922 Gastrointestinal hemorrhage, unspecified: Secondary | ICD-10-CM | POA: Diagnosis not present

## 2022-11-11 DIAGNOSIS — R251 Tremor, unspecified: Secondary | ICD-10-CM

## 2022-11-11 DIAGNOSIS — Z9889 Other specified postprocedural states: Secondary | ICD-10-CM | POA: Diagnosis not present

## 2022-11-11 DIAGNOSIS — I482 Chronic atrial fibrillation, unspecified: Secondary | ICD-10-CM | POA: Diagnosis not present

## 2022-11-11 DIAGNOSIS — F0390 Unspecified dementia without behavioral disturbance: Secondary | ICD-10-CM

## 2022-11-11 DIAGNOSIS — Z5181 Encounter for therapeutic drug level monitoring: Secondary | ICD-10-CM | POA: Diagnosis not present

## 2022-11-11 LAB — HEPARIN LEVEL (UNFRACTIONATED): Heparin Unfractionated: 0.3 [IU]/mL (ref 0.30–0.70)

## 2022-11-11 LAB — HEMOGLOBIN AND HEMATOCRIT, BLOOD
HCT: 25.4 % — ABNORMAL LOW (ref 36.0–46.0)
Hemoglobin: 8.3 g/dL — ABNORMAL LOW (ref 12.0–15.0)

## 2022-11-11 MED ORDER — POTASSIUM IODIDE (ANTIDOTE) 130 MG PO TABS
130.0000 mg | ORAL_TABLET | Freq: Once | ORAL | Status: AC
  Administered 2022-11-11: 130 mg via ORAL

## 2022-11-11 MED ORDER — POTASSIUM IODIDE (ANTIDOTE) 130 MG PO TABS
ORAL_TABLET | ORAL | Status: AC
  Filled 2022-11-11: qty 1

## 2022-11-11 MED ORDER — PANTOPRAZOLE SODIUM 40 MG PO TBEC
40.0000 mg | DELAYED_RELEASE_TABLET | Freq: Two times a day (BID) | ORAL | 1 refills | Status: AC
Start: 2022-11-11 — End: ?

## 2022-11-11 MED ORDER — APIXABAN 2.5 MG PO TABS: 2.5000 mg | ORAL_TABLET | Freq: Two times a day (BID) | ORAL | 0 refills | Status: DC

## 2022-11-11 MED ORDER — PANTOPRAZOLE SODIUM 40 MG PO TBEC: 40.0000 mg | DELAYED_RELEASE_TABLET | Freq: Every day | ORAL | 1 refills | Status: DC

## 2022-11-11 MED ORDER — IOFLUPANE I 123 185 MBQ/2.5ML IV SOLN
4.6000 | Freq: Once | INTRAVENOUS | Status: AC | PRN
  Administered 2022-11-11: 4.6 via INTRAVENOUS
  Filled 2022-11-11: qty 5

## 2022-11-11 NOTE — Progress Notes (Signed)
Cardiologist:  Bensimohn / Lalla Brothers  Subjective:  Denies SSCP, palpitations or Dyspnea Long discussion about anticoagulation Rx   Objective:  Vitals:   11/11/22 0422 11/11/22 0500 11/11/22 0754 11/11/22 0921  BP: 117/60   (!) 108/55  Pulse: 70  72 76  Resp: 20 (!) 22 16 20   Temp: 98.2 F (36.8 C)   98.2 F (36.8 C)  TempSrc: Oral     SpO2: 99%  94% 100%  Weight:  58.5 kg    Height:        Intake/Output from previous day:  Intake/Output Summary (Last 24 hours) at 11/11/2022 1002 Last data filed at 11/11/2022 0441 Gross per 24 hour  Intake 814.39 ml  Output 750 ml  Net 64.39 ml    Physical Exam:  Frail elderly black female JVP elevated Lungs clear SEM through AVR  Abdomen benign No edema AICD under left clavicle  Lab Results: Basic Metabolic Panel: Recent Labs    11/10/22 2044  NA 136  K 4.5  CL 105  CO2 20*  GLUCOSE 104*  BUN 21  CREATININE 1.14*  CALCIUM 8.8*   Liver Function Tests: Recent Labs    11/10/22 2044  AST 28  ALT 16  ALKPHOS 67  BILITOT 0.4  PROT 6.1*  ALBUMIN 2.4*   No results for input(s): "LIPASE", "AMYLASE" in the last 72 hours. CBC: Recent Labs    11/09/22 0358 11/10/22 0946  WBC 7.9 8.5  HGB 8.1* 7.9*  HCT 24.3* 24.6*  MCV 97.2 96.5  PLT 124* 154     Imaging: VAS [place] LOWER EXTREMITY VENOUS (DVT)  Result Date: 11/10/2022  Lower Venous DVT Study Patient Name:  Lindsey Huber  Date of Exam:   11/10/2022 Medical Rec #: [id number]         Accession #:    [id number] Date of Birth: Jul 18, 1945         Patient Gender: F Patient Age:   29 years Exam Location:  [facility] Procedure:      VAS [place] LOWER EXTREMITY VENOUS (DVT) Referring Phys: RAVI PAHWANI --------------------------------------------------------------------------------  Indications: Edema.  Comparison Study: Previous exam on 07/21/2022 was negative for DVT Performing Technologist: Ernestene Mention RVT, RDMS  Examination Guidelines: A complete evaluation includes  B-mode imaging, spectral Doppler, color Doppler, and power Doppler as needed of all accessible portions of each vessel. Bilateral testing is considered an integral part of a complete examination. Limited examinations for reoccurring indications may be performed as noted. The reflux portion of the exam is performed with the patient in reverse Trendelenburg.  +---------+---------------+---------+-----------+----------+--------------+ RIGHT    CompressibilityPhasicitySpontaneityPropertiesThrombus Aging +---------+---------------+---------+-----------+----------+--------------+ CFV      Full           Yes      Yes                                 +---------+---------------+---------+-----------+----------+--------------+ SFJ      Full                                                        +---------+---------------+---------+-----------+----------+--------------+ FV Prox  Full           Yes      Yes                                 +---------+---------------+---------+-----------+----------+--------------+  FV Mid   Full           Yes      Yes                                 +---------+---------------+---------+-----------+----------+--------------+ FV DistalFull           Yes      Yes                                 +---------+---------------+---------+-----------+----------+--------------+ PFV      Full                                                        +---------+---------------+---------+-----------+----------+--------------+ POP      Full           Yes      Yes                                 +---------+---------------+---------+-----------+----------+--------------+ PTV      Full                                                        +---------+---------------+---------+-----------+----------+--------------+ PERO     Full                                                         +---------+---------------+---------+-----------+----------+--------------+   +----+---------------+---------+-----------+----------+--------------+ LEFTCompressibilityPhasicitySpontaneityPropertiesThrombus Aging +----+---------------+---------+-----------+----------+--------------+ CFV Full           Yes      Yes                                 +----+---------------+---------+-----------+----------+--------------+     Summary: RIGHT: - There is no evidence of deep vein thrombosis in the lower extremity.  - No cystic structure found in the popliteal fossa. Subcutaneous edema extending from knee to ankle.   *See table(s) above for measurements and observations. Electronically signed by Coral Else MD on 11/10/2022 at 7:19:29 PM.    Final     Cardiac Studies:  ECG:  Orders placed or performed during the hospital encounter of 11/02/22   EKG 12-Lead   EKG 12-Lead   ED EKG   ED EKG   EKG 12-Lead   EKG 12-Lead   EKG   EKG     Telemetry:  Echo: EF 60-65% severe bi atrial enlargement Post AVR with stenosis man gradient 32 mmHg S/P MV ring and TVR   1. Left ventricular ejection fraction, by estimation, is 60 to 65%. The  left ventricle has normal function. The left ventricle has no regional  wall motion abnormalities. There is moderate left ventricular hypertrophy.  Left ventricular diastolic  parameters are indeterminate.   2. Right ventricular systolic function is moderately reduced.  The right  ventricular size is mildly enlarged.   3. Left atrial size was severely dilated.   4. Right atrial size was severely dilated.   5. HR 69 bpm. Trivial mitral valve regurgitation. The mean mitral valve  gradient is 5.0 mmHg. There is a prosthetic annuloplasty ring present in  the mitral position.   6. S/p tricuspid annulopasty ring. TR mean gradient 7 mmHg. Tricuspid  valve regurgitation is moderate. 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   7. 19 mm bioprosthetic valve. V max 3.8 m/s. LVOT  diam may be  underestimated. Unchanged from prior study. c/f stenosis. The aortic valve  was not well visualized. Aortic valve regurgitation is not visualized.  There is a 19 mm porcine valve present in the   aortic position.   8. The inferior vena cava is dilated in size with >50% respiratory  variability, suggesting right atrial pressure of 8 mmHg.   Comparison(s): No significant change from prior study.    Medications:    allopurinol  100 mg Oral Daily   fluticasone furoate-vilanterol  1 puff Inhalation Daily   And   umeclidinium bromide  1 puff Inhalation Daily   hydroxychloroquine  200 mg Oral Daily   levETIRAcetam  500 mg Oral BID   levothyroxine  25 mcg Oral Daily   pantoprazole (PROTONIX) IV  40 mg Intravenous Q12H   predniSONE  5 mg Oral Q breakfast      heparin 650 Units/hr (11/11/22 0950)    Assessment/Plan:   Anticoagulation Management:  CHADVASC 6. Prior TIA with valvular afib. Discussed with Dr Lalla Brothers She is not a candidate for Watchman due to afib being valvular..  She has had recurrent diverticular GI bleeds and falls with thigh hematoma December 2023. GI/IR has not done/been able to identify specific bleeding source to embolize. Looks like she had been on eliquis in 2022 and changed to xarelto for nose bleeds. Risk profile of eliquis shows less GI bleeding Would hold DOAC for 5 days ( did this in December with no neurologic issues ) then start eliquis 2.5 mg BID for age, body weight and renal function  Valve Dx:  known stenosis of AVR. Prior TVR and MV ring annuloplasty not much to do differently She is euvolemic  Afib:  permanent ECG with V pacing set VVIR  Arthritis :  causing leg weakness and falls on prednisone and hydroxychloroquine avoid NSAI's GI:  Hct stable no bleeding over 48 hours on heparin see above regarding DOAC holiday for 5 days  Thyroid:  continue synthroid replacement   Cardiology will sign off Outpatient f/u with Dr Lalla Brothers made   Charlton Haws 11/11/2022, 10:02 AM

## 2022-11-11 NOTE — TOC Transition Note (Signed)
Transition of Care [facility]) - CM/SW Discharge Note   Patient Details  Name: Talitha Dicarlo MRN: [id number] Date of Birth: November 30, 1945  Transition of Care [facility]) CM/SW Contact:  Tom-Johnson, Hershal Coria, RN Phone Number: 11/11/2022, 12:23 PM   Clinical Narrative:     Patient is scheduled for discharge today.  Readmission Risk Assessment done. Home Health Resumption of Care info, Outpatient f/u, hospital f/u and discharge instructions on AVS. Daughter, Marcelino Duster to transport at discharge.  No further TOC needs noted.          Final next level of care: Home w Home Health Services Barriers to Discharge: Barriers Resolved   Patient Goals and CMS Choice CMS Medicare.gov Compare Post Acute Care list provided to:: Patient Choice offered to / list presented to : Patient, Adult Children (Daughter, Marcelino Duster)  Discharge Placement                  Patient to be transferred to facility by: Daughter Name of family member notified: [facility]    Discharge Plan and Services Additional resources added to the After Visit Summary for                  DME Arranged: N/A DME Agency: NA Date DME Agency Contacted: 11/05/22     HH Arranged: PT, OT, RN, Disease Management HH Agency: [facility] Date [facility] Agency Contacted: 11/05/22 Time HH Agency Contacted: 1120 Representative spoke with at [facility] Agency: Shanda Bumps  Social Determinants of Health (SDOH) Interventions SDOH Screenings   Food Insecurity: No Food Insecurity (11/02/2022)  Housing: Low Risk  (11/02/2022)  Transportation Needs: No Transportation Needs (11/02/2022)  Utilities: Not At Risk (11/02/2022)  Tobacco Use: Low Risk  (11/02/2022)     Readmission Risk Interventions    11/05/2022   10:54 AM 07/05/2022   12:36 PM 12/27/2021    3:07 PM  Readmission Risk Prevention Plan  Transportation Screening Complete Complete Complete  PCP or Specialist Appt within 3-5 Days  Complete Complete  HRI or Home Care Consult   Complete Complete  Social Work Consult for Recovery Care Planning/Counseling  Complete Complete  Palliative Care Screening  Not Applicable Complete  Medication Review Ocean[facility]) Referral to Pharmacy Referral to Pharmacy Complete  PCP or Specialist appointment within 3-5 days of discharge Complete    HRI or Home Care Consult Complete    SW Recovery Care/Counseling Consult Complete    Palliative Care Screening Not Applicable    Skilled Nursing Facility Not Applicable

## 2022-11-11 NOTE — Plan of Care (Signed)
  Problem: Clinical Measurements: Goal: Ability to maintain clinical measurements within normal limits will improve Outcome: Adequate for Discharge   Problem: Activity: Goal: Risk for activity intolerance will decrease Outcome: Adequate for Discharge   Problem: Nutrition: Goal: Adequate nutrition will be maintained Outcome: Adequate for Discharge   Problem: Coping: Goal: Level of anxiety will decrease Outcome: Adequate for Discharge   Problem: Elimination: Goal: Will not experience complications related to bowel motility Outcome: Adequate for Discharge Goal: Will not experience complications related to urinary retention Outcome: Adequate for Discharge   Problem: Pain Managment: Goal: General experience of comfort will improve Outcome: Adequate for Discharge   Problem: Safety: Goal: Ability to remain free from injury will improve Outcome: Adequate for Discharge   Problem: Skin Integrity: Goal: Risk for impaired skin integrity will decrease Outcome: Adequate for Discharge   Problem: Education: Goal: Understanding of post-operative needs will improve Outcome: Adequate for Discharge Goal: Individualized Educational Video(s) Outcome: Adequate for Discharge   Problem: Clinical Measurements: Goal: Postoperative complications will be avoided or minimized Outcome: Adequate for Discharge   Problem: Respiratory: Goal: Will regain and/or maintain adequate ventilation Outcome: Adequate for Discharge

## 2022-11-11 NOTE — Progress Notes (Signed)
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   Allergies  Allergen Reactions   Other Other (See Comments)    Patient is to NOT EAT any foods with husks or tree nuts, popcorn   Propofol Shortness Of Breath and Other (See Comments)    "Caused asthma"   Flecainide Other (See Comments)    Reaction not recalled   Amiodarone Other (See Comments)    Delirium/Confusion/Psychosis   Amlodipine Other (See Comments)    "Tired and syncope"    Patient Measurements: Height: 5\' 3"  (160 cm) Weight: 55.5 kg (122 lb 5.7 oz) IBW/kg (Calculated) : 52.4 Heparin Dosing Weight: 54.7 kg  Vital Signs: Temp: 98.2 F (36.8 C) (10/17 0422) Temp Source: Oral (10/17 0422) BP: 117/60 (10/17 0422) Pulse Rate: 70 (10/17 0422)  Labs: Recent Labs    11/08/22 1842 11/09/22 0358 11/09/22 1854 11/10/22 0946 11/10/22 2044 11/11/22 0437  HGB 8.0* 8.1*  --  7.9*  --   --   HCT 23.7* 24.3*  --  24.6*  --   --   PLT 128* 124*  --  154  --   --   HEPARINUNFRC  --   --    < > 0.13* 0.25* 0.30  CREATININE  --   --   --   --  1.14*  --    < > = values in this interval not displayed.    Estimated Creatinine Clearance: 35.3 mL/min (A) (by C-G formula based on SCr of 1.14 mg/dL (H)).   Medical History: Past Medical History:  Diagnosis Date   Achalasia    Acquired hypothyroidism 08/20/2020   Acute metabolic encephalopathy 07/25/2021   AICD (automatic cardioverter/defibrillator) present 11/14/2019   Allergic rhinitis    Ankle fracture 03/25/2022   Aortic valve disorder 03/26/2002   Atherosclerosis of abdominal aorta (HCC) 05/01/2020   Cardiomyopathy (HCC)    CHB (complete heart block) (HCC) 08/18/2017   CHF (congestive heart failure) (HCC)    Cholelithiasis without obstruction 05/01/2020   Chronic gouty arthritis    Chronic heart failure with preserved ejection fraction (HCC) 05/01/2020    Chronic kidney disease (CKD), active medical management without dialysis, stage 3 (moderate) (HCC) 05/01/2020   Closed torus fracture of distal end of right radius with delayed healing 08/12/2021   Delusional thoughts (HCC)    Diverticulosis of colon 05/01/2020   Epistaxis    Essential (primary) hypertension 08/18/2017   Gastroesophageal reflux disease 05/01/2020   Gastrointestinal hemorrhage    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 11/14/2019   Hypercoagulability due to atrial fibrillation (HCC) 05/01/2020   Hyperlipidemia 11/14/2019   Hypocalcemia 12/14/2020   Hypoglycemia    Hypokalemia 12/14/2020   Hypomagnesemia 12/14/2020   Hypotension 12/14/2020   Idiopathic pulmonary fibrosis (HCC) 05/01/2020   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   Iron deficiency anemia    Long term (current) use of anticoagulants    Malnutrition of mild degree [name]: 75% to less than 90% of standard weight) (HCC)    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/12/2021   Mitral and aortic incompetence 11/14/2019   Non-rheumatic atrial fibrillation (HCC) 05/01/2020   Non-toxic multinodular goiter 08/20/2020   NSVT (nonsustained ventricular tachycardia) (HCC) 08/18/2017   Oropharyngeal dysphagia    Osteoarthritis of hip 05/01/2020   Osteoarthritis of knee 05/01/2020   Osteopenia of neck of left femur    Pain of left hip joint 12/12/2017   Proteinuria 05/01/2020   Raynaud's disease  Recurrent falls 04/09/2021   Rheumatoid arthritis (HCC)    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   Septic shock (HCC) 03/10/2020   Slow transit constipation    Systemic lupus erythematosus (HCC) 05/01/2020   Thrombophilia (HCC)    Transient ischemic attack    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   Vitamin D deficiency     Medications:  Scheduled:   allopurinol  100 mg Oral Daily   fluticasone furoate-vilanterol  1 puff Inhalation Daily   And    umeclidinium bromide  1 puff Inhalation Daily   hydroxychloroquine  200 mg Oral Daily   levETIRAcetam  500 mg Oral BID   levothyroxine  25 mcg Oral Daily   pantoprazole (PROTONIX) IV  40 mg Intravenous Q12H   predniSONE  5 mg Oral Q breakfast   Infusions:   heparin 650 Units/hr (11/10/22 2140)     Assessment: 77 years of age female with history of multi-vavular disease with bioprosthetic valves and atrial fibrillation on Xarelto prior to admission who presented with diverticular bleed. Xarelto was held (last dose 10/7) and Kcentra 25 units/kg and 1 unit PRBC given on 10/8 PM. Heparin was held 10/12 AM d/t concern for recurrent diverticular bleed with blood noted in stool on multiple occasions- last observed 10/13 PM per MD note. After multiple CBCs, patient's Hgb has remained stable in the low 8s without further bleeding noted. MD OK with resuming heparin without bolus, and targeting lower end of therapeutic range.   Heparin level is therapeutic at 0.3.  Goal of Therapy:  Heparin level 0.3-0.5 Monitor platelets by anticoagulation protocol: Yes   Plan:  Continue heparin 650 units/h Daily heparin level and CBC  Fredonia Highland, PharmD, BCPS, [facility] Clinical Pharmacist Please check AMION for all [facility] Pharmacy numbers 11/11/2022

## 2022-11-11 NOTE — Discharge Summary (Addendum)
resumption Of Care visit. Contact information: [address] [place] [address] [phone]                Discharge Exam: Filed Weights   11/07/22 2100 11/08/22 0722 11/11/22 0500  Weight: 56.5 kg 55.5 kg 58.5 kg   General; NAD  Condition at discharge: stable  The results of significant diagnostics from this hospitalization (including imaging, microbiology, ancillary and laboratory) are listed below for reference.   Imaging Studies: VAS [place] LOWER EXTREMITY VENOUS (DVT)  Result Date: 11/10/2022  Lower Venous DVT Study Patient Name:  Lindsey Huber  Date of Exam:   11/10/2022 Medical Rec #: [id number]         Accession #:    [id number] Date of Birth: [birth year]         Patient Gender: F Patient Age:   77 years Exam Location:  [facility] Procedure:      VAS [place] LOWER EXTREMITY VENOUS (DVT) Referring Phys: RAVI PAHWANI --------------------------------------------------------------------------------  Indications: Edema.  Comparison Study: Previous exam on 07/21/2022 was negative for DVT Performing Technologist: Ernestene Mention RVT, RDMS  Examination Guidelines: A complete evaluation includes B-mode  imaging, spectral Doppler, color Doppler, and power Doppler as needed of all accessible portions of each vessel. Bilateral testing is considered an integral part of a complete examination. Limited examinations for reoccurring indications may be performed as noted. The reflux portion of the exam is performed with the patient in reverse Trendelenburg.  +---------+---------------+---------+-----------+----------+--------------+ RIGHT    CompressibilityPhasicitySpontaneityPropertiesThrombus Aging +---------+---------------+---------+-----------+----------+--------------+ CFV      Full           Yes      Yes                                 +---------+---------------+---------+-----------+----------+--------------+ SFJ      Full                                                        +---------+---------------+---------+-----------+----------+--------------+ FV Prox  Full           Yes      Yes                                 +---------+---------------+---------+-----------+----------+--------------+ FV Mid   Full           Yes      Yes                                 +---------+---------------+---------+-----------+----------+--------------+ FV DistalFull           Yes      Yes                                 +---------+---------------+---------+-----------+----------+--------------+ PFV      Full                                                        +---------+---------------+---------+-----------+----------+--------------+  Calcified uterine fibroids. Call report request was placed at the time of interpretation. Report issued at this time in the interest of expediency. Final communication of critical findings will be documented. Aortic Atherosclerosis (ICD10-I70.0). Electronically Signed   By: Jearld Lesch M.D.   On: 11/02/2022 18:39   EEG adult  Result Date: 10/26/2022 Charlsie Quest, MD     10/26/2022  8:11 PM Patient Name: Lindsey Huber MRN: [id number] Epilepsy Attending: Charlsie Quest Referring Physician/Provider: Caryl Pina, MD Date: 10/26/2022 Duration: 22.44 mins Patient history: 77 yr old female being seen today (10/26/2022) at [facility] clinic. At 15:15 today, she had a witnessed "staring into space" spell followed by difficulty speaking. EEG to evaluate for seizure Level of alertness: Awake, drowsy AEDs during EEG study: None Technical aspects: This EEG study was done with scalp electrodes positioned according to the 10-20 International system of  electrode placement. Electrical activity was reviewed with band pass filter of 1-70Hz , sensitivity of 7 uV/mm, display speed of 79mm/sec with a 60Hz  notched filter applied as appropriate. EEG data were recorded continuously and digitally stored.  Video monitoring was available and reviewed as appropriate. Description: The posterior dominant rhythm consists of 9 Hz activity of moderate voltage (25-35 uV) seen predominantly in posterior head regions, symmetric and reactive to eye opening and eye closing. Drowsiness was characterized by attenuation of the posterior background rhythm. EEG showed continuous 3 to 6 Hz theta-delta slowing in left frontotemporal region. Sharp waves were noted in left anterior temporal region. Physiologic photic driving was not seen during photic stimulation.  Hyperventilation was not performed.   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 Additionally there is cortical dysfunction arising from left frontotemporal region. No seizures were seen throughout the recording. Dr. Otelia Limes was notified. Charlsie Quest   ECHOCARDIOGRAM COMPLETE  Result Date: 10/26/2022    ECHOCARDIOGRAM REPORT   Patient Name:   Lindsey Huber Date of Exam: 10/26/2022 Medical Rec #:  [id number]        Height:       61.0 in Accession #:    [id number]       Weight:       121.0 lb Date of Birth:  1945-12-10        BSA:          1.526 m Patient Age:    77 years         BP:           151/84 mmHg Patient Gender: F                HR:           69 bpm. Exam Location:  Outpatient Procedure: 2D Echo, Cardiac Doppler and Color Doppler Indications:    I50.9 CHF  History:        Patient has prior history of Echocardiogram examinations, most                 recent 03/18/2022. Defibrillator, PAD; Aortic Valve Disease,                 Mitral Valve Disease and Tricuspid  valve anunuloplasty ring.                 Previous echo revealed LVEF 65% AVA mean gradient 35 mmHg,  resumption Of Care visit. Contact information: [address] [place] [address] [phone]                Discharge Exam: Filed Weights   11/07/22 2100 11/08/22 0722 11/11/22 0500  Weight: 56.5 kg 55.5 kg 58.5 kg   General; NAD  Condition at discharge: stable  The results of significant diagnostics from this hospitalization (including imaging, microbiology, ancillary and laboratory) are listed below for reference.   Imaging Studies: VAS [place] LOWER EXTREMITY VENOUS (DVT)  Result Date: 11/10/2022  Lower Venous DVT Study Patient Name:  Lindsey Huber  Date of Exam:   11/10/2022 Medical Rec #: [id number]         Accession #:    [id number] Date of Birth: [birth year]         Patient Gender: F Patient Age:   77 years Exam Location:  [facility] Procedure:      VAS [place] LOWER EXTREMITY VENOUS (DVT) Referring Phys: RAVI PAHWANI --------------------------------------------------------------------------------  Indications: Edema.  Comparison Study: Previous exam on 07/21/2022 was negative for DVT Performing Technologist: Ernestene Mention RVT, RDMS  Examination Guidelines: A complete evaluation includes B-mode  imaging, spectral Doppler, color Doppler, and power Doppler as needed of all accessible portions of each vessel. Bilateral testing is considered an integral part of a complete examination. Limited examinations for reoccurring indications may be performed as noted. The reflux portion of the exam is performed with the patient in reverse Trendelenburg.  +---------+---------------+---------+-----------+----------+--------------+ RIGHT    CompressibilityPhasicitySpontaneityPropertiesThrombus Aging +---------+---------------+---------+-----------+----------+--------------+ CFV      Full           Yes      Yes                                 +---------+---------------+---------+-----------+----------+--------------+ SFJ      Full                                                        +---------+---------------+---------+-----------+----------+--------------+ FV Prox  Full           Yes      Yes                                 +---------+---------------+---------+-----------+----------+--------------+ FV Mid   Full           Yes      Yes                                 +---------+---------------+---------+-----------+----------+--------------+ FV DistalFull           Yes      Yes                                 +---------+---------------+---------+-----------+----------+--------------+ PFV      Full                                                        +---------+---------------+---------+-----------+----------+--------------+  resumption Of Care visit. Contact information: [address] [place] [address] [phone]                Discharge Exam: Filed Weights   11/07/22 2100 11/08/22 0722 11/11/22 0500  Weight: 56.5 kg 55.5 kg 58.5 kg   General; NAD  Condition at discharge: stable  The results of significant diagnostics from this hospitalization (including imaging, microbiology, ancillary and laboratory) are listed below for reference.   Imaging Studies: VAS [place] LOWER EXTREMITY VENOUS (DVT)  Result Date: 11/10/2022  Lower Venous DVT Study Patient Name:  Lindsey Huber  Date of Exam:   11/10/2022 Medical Rec #: [id number]         Accession #:    [id number] Date of Birth: [birth year]         Patient Gender: F Patient Age:   77 years Exam Location:  [facility] Procedure:      VAS [place] LOWER EXTREMITY VENOUS (DVT) Referring Phys: RAVI PAHWANI --------------------------------------------------------------------------------  Indications: Edema.  Comparison Study: Previous exam on 07/21/2022 was negative for DVT Performing Technologist: Ernestene Mention RVT, RDMS  Examination Guidelines: A complete evaluation includes B-mode  imaging, spectral Doppler, color Doppler, and power Doppler as needed of all accessible portions of each vessel. Bilateral testing is considered an integral part of a complete examination. Limited examinations for reoccurring indications may be performed as noted. The reflux portion of the exam is performed with the patient in reverse Trendelenburg.  +---------+---------------+---------+-----------+----------+--------------+ RIGHT    CompressibilityPhasicitySpontaneityPropertiesThrombus Aging +---------+---------------+---------+-----------+----------+--------------+ CFV      Full           Yes      Yes                                 +---------+---------------+---------+-----------+----------+--------------+ SFJ      Full                                                        +---------+---------------+---------+-----------+----------+--------------+ FV Prox  Full           Yes      Yes                                 +---------+---------------+---------+-----------+----------+--------------+ FV Mid   Full           Yes      Yes                                 +---------+---------------+---------+-----------+----------+--------------+ FV DistalFull           Yes      Yes                                 +---------+---------------+---------+-----------+----------+--------------+ PFV      Full                                                        +---------+---------------+---------+-----------+----------+--------------+  resumption Of Care visit. Contact information: [address] [place] [address] [phone]                Discharge Exam: Filed Weights   11/07/22 2100 11/08/22 0722 11/11/22 0500  Weight: 56.5 kg 55.5 kg 58.5 kg   General; NAD  Condition at discharge: stable  The results of significant diagnostics from this hospitalization (including imaging, microbiology, ancillary and laboratory) are listed below for reference.   Imaging Studies: VAS [place] LOWER EXTREMITY VENOUS (DVT)  Result Date: 11/10/2022  Lower Venous DVT Study Patient Name:  Lindsey Huber  Date of Exam:   11/10/2022 Medical Rec #: [id number]         Accession #:    [id number] Date of Birth: [birth year]         Patient Gender: F Patient Age:   77 years Exam Location:  [facility] Procedure:      VAS [place] LOWER EXTREMITY VENOUS (DVT) Referring Phys: RAVI PAHWANI --------------------------------------------------------------------------------  Indications: Edema.  Comparison Study: Previous exam on 07/21/2022 was negative for DVT Performing Technologist: Ernestene Mention RVT, RDMS  Examination Guidelines: A complete evaluation includes B-mode  imaging, spectral Doppler, color Doppler, and power Doppler as needed of all accessible portions of each vessel. Bilateral testing is considered an integral part of a complete examination. Limited examinations for reoccurring indications may be performed as noted. The reflux portion of the exam is performed with the patient in reverse Trendelenburg.  +---------+---------------+---------+-----------+----------+--------------+ RIGHT    CompressibilityPhasicitySpontaneityPropertiesThrombus Aging +---------+---------------+---------+-----------+----------+--------------+ CFV      Full           Yes      Yes                                 +---------+---------------+---------+-----------+----------+--------------+ SFJ      Full                                                        +---------+---------------+---------+-----------+----------+--------------+ FV Prox  Full           Yes      Yes                                 +---------+---------------+---------+-----------+----------+--------------+ FV Mid   Full           Yes      Yes                                 +---------+---------------+---------+-----------+----------+--------------+ FV DistalFull           Yes      Yes                                 +---------+---------------+---------+-----------+----------+--------------+ PFV      Full                                                        +---------+---------------+---------+-----------+----------+--------------+  Calcified uterine fibroids. Call report request was placed at the time of interpretation. Report issued at this time in the interest of expediency. Final communication of critical findings will be documented. Aortic Atherosclerosis (ICD10-I70.0). Electronically Signed   By: Jearld Lesch M.D.   On: 11/02/2022 18:39   EEG adult  Result Date: 10/26/2022 Charlsie Quest, MD     10/26/2022  8:11 PM Patient Name: Lindsey Huber MRN: [id number] Epilepsy Attending: Charlsie Quest Referring Physician/Provider: Caryl Pina, MD Date: 10/26/2022 Duration: 22.44 mins Patient history: 77 yr old female being seen today (10/26/2022) at [facility] clinic. At 15:15 today, she had a witnessed "staring into space" spell followed by difficulty speaking. EEG to evaluate for seizure Level of alertness: Awake, drowsy AEDs during EEG study: None Technical aspects: This EEG study was done with scalp electrodes positioned according to the 10-20 International system of  electrode placement. Electrical activity was reviewed with band pass filter of 1-70Hz , sensitivity of 7 uV/mm, display speed of 79mm/sec with a 60Hz  notched filter applied as appropriate. EEG data were recorded continuously and digitally stored.  Video monitoring was available and reviewed as appropriate. Description: The posterior dominant rhythm consists of 9 Hz activity of moderate voltage (25-35 uV) seen predominantly in posterior head regions, symmetric and reactive to eye opening and eye closing. Drowsiness was characterized by attenuation of the posterior background rhythm. EEG showed continuous 3 to 6 Hz theta-delta slowing in left frontotemporal region. Sharp waves were noted in left anterior temporal region. Physiologic photic driving was not seen during photic stimulation.  Hyperventilation was not performed.   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 Additionally there is cortical dysfunction arising from left frontotemporal region. No seizures were seen throughout the recording. Dr. Otelia Limes was notified. Charlsie Quest   ECHOCARDIOGRAM COMPLETE  Result Date: 10/26/2022    ECHOCARDIOGRAM REPORT   Patient Name:   Lindsey Huber Date of Exam: 10/26/2022 Medical Rec #:  [id number]        Height:       61.0 in Accession #:    [id number]       Weight:       121.0 lb Date of Birth:  1945-12-10        BSA:          1.526 m Patient Age:    77 years         BP:           151/84 mmHg Patient Gender: F                HR:           69 bpm. Exam Location:  Outpatient Procedure: 2D Echo, Cardiac Doppler and Color Doppler Indications:    I50.9 CHF  History:        Patient has prior history of Echocardiogram examinations, most                 recent 03/18/2022. Defibrillator, PAD; Aortic Valve Disease,                 Mitral Valve Disease and Tricuspid  valve anunuloplasty ring.                 Previous echo revealed LVEF 65% AVA mean gradient 35 mmHg,  resumption Of Care visit. Contact information: [address] [place] [address] [phone]                Discharge Exam: Filed Weights   11/07/22 2100 11/08/22 0722 11/11/22 0500  Weight: 56.5 kg 55.5 kg 58.5 kg   General; NAD  Condition at discharge: stable  The results of significant diagnostics from this hospitalization (including imaging, microbiology, ancillary and laboratory) are listed below for reference.   Imaging Studies: VAS [place] LOWER EXTREMITY VENOUS (DVT)  Result Date: 11/10/2022  Lower Venous DVT Study Patient Name:  Lindsey Huber  Date of Exam:   11/10/2022 Medical Rec #: [id number]         Accession #:    [id number] Date of Birth: [birth year]         Patient Gender: F Patient Age:   77 years Exam Location:  [facility] Procedure:      VAS [place] LOWER EXTREMITY VENOUS (DVT) Referring Phys: RAVI PAHWANI --------------------------------------------------------------------------------  Indications: Edema.  Comparison Study: Previous exam on 07/21/2022 was negative for DVT Performing Technologist: Ernestene Mention RVT, RDMS  Examination Guidelines: A complete evaluation includes B-mode  imaging, spectral Doppler, color Doppler, and power Doppler as needed of all accessible portions of each vessel. Bilateral testing is considered an integral part of a complete examination. Limited examinations for reoccurring indications may be performed as noted. The reflux portion of the exam is performed with the patient in reverse Trendelenburg.  +---------+---------------+---------+-----------+----------+--------------+ RIGHT    CompressibilityPhasicitySpontaneityPropertiesThrombus Aging +---------+---------------+---------+-----------+----------+--------------+ CFV      Full           Yes      Yes                                 +---------+---------------+---------+-----------+----------+--------------+ SFJ      Full                                                        +---------+---------------+---------+-----------+----------+--------------+ FV Prox  Full           Yes      Yes                                 +---------+---------------+---------+-----------+----------+--------------+ FV Mid   Full           Yes      Yes                                 +---------+---------------+---------+-----------+----------+--------------+ FV DistalFull           Yes      Yes                                 +---------+---------------+---------+-----------+----------+--------------+ PFV      Full                                                        +---------+---------------+---------+-----------+----------+--------------+  Calcified uterine fibroids. Call report request was placed at the time of interpretation. Report issued at this time in the interest of expediency. Final communication of critical findings will be documented. Aortic Atherosclerosis (ICD10-I70.0). Electronically Signed   By: Jearld Lesch M.D.   On: 11/02/2022 18:39   EEG adult  Result Date: 10/26/2022 Charlsie Quest, MD     10/26/2022  8:11 PM Patient Name: Lindsey Huber MRN: [id number] Epilepsy Attending: Charlsie Quest Referring Physician/Provider: Caryl Pina, MD Date: 10/26/2022 Duration: 22.44 mins Patient history: 77 yr old female being seen today (10/26/2022) at [facility] clinic. At 15:15 today, she had a witnessed "staring into space" spell followed by difficulty speaking. EEG to evaluate for seizure Level of alertness: Awake, drowsy AEDs during EEG study: None Technical aspects: This EEG study was done with scalp electrodes positioned according to the 10-20 International system of  electrode placement. Electrical activity was reviewed with band pass filter of 1-70Hz , sensitivity of 7 uV/mm, display speed of 79mm/sec with a 60Hz  notched filter applied as appropriate. EEG data were recorded continuously and digitally stored.  Video monitoring was available and reviewed as appropriate. Description: The posterior dominant rhythm consists of 9 Hz activity of moderate voltage (25-35 uV) seen predominantly in posterior head regions, symmetric and reactive to eye opening and eye closing. Drowsiness was characterized by attenuation of the posterior background rhythm. EEG showed continuous 3 to 6 Hz theta-delta slowing in left frontotemporal region. Sharp waves were noted in left anterior temporal region. Physiologic photic driving was not seen during photic stimulation.  Hyperventilation was not performed.   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 Additionally there is cortical dysfunction arising from left frontotemporal region. No seizures were seen throughout the recording. Dr. Otelia Limes was notified. Charlsie Quest   ECHOCARDIOGRAM COMPLETE  Result Date: 10/26/2022    ECHOCARDIOGRAM REPORT   Patient Name:   Lindsey Huber Date of Exam: 10/26/2022 Medical Rec #:  [id number]        Height:       61.0 in Accession #:    [id number]       Weight:       121.0 lb Date of Birth:  1945-12-10        BSA:          1.526 m Patient Age:    77 years         BP:           151/84 mmHg Patient Gender: F                HR:           69 bpm. Exam Location:  Outpatient Procedure: 2D Echo, Cardiac Doppler and Color Doppler Indications:    I50.9 CHF  History:        Patient has prior history of Echocardiogram examinations, most                 recent 03/18/2022. Defibrillator, PAD; Aortic Valve Disease,                 Mitral Valve Disease and Tricuspid  valve anunuloplasty ring.                 Previous echo revealed LVEF 65% AVA mean gradient 35 mmHg,  Calcified uterine fibroids. Call report request was placed at the time of interpretation. Report issued at this time in the interest of expediency. Final communication of critical findings will be documented. Aortic Atherosclerosis (ICD10-I70.0). Electronically Signed   By: Jearld Lesch M.D.   On: 11/02/2022 18:39   EEG adult  Result Date: 10/26/2022 Charlsie Quest, MD     10/26/2022  8:11 PM Patient Name: Lindsey Huber MRN: [id number] Epilepsy Attending: Charlsie Quest Referring Physician/Provider: Caryl Pina, MD Date: 10/26/2022 Duration: 22.44 mins Patient history: 77 yr old female being seen today (10/26/2022) at [facility] clinic. At 15:15 today, she had a witnessed "staring into space" spell followed by difficulty speaking. EEG to evaluate for seizure Level of alertness: Awake, drowsy AEDs during EEG study: None Technical aspects: This EEG study was done with scalp electrodes positioned according to the 10-20 International system of  electrode placement. Electrical activity was reviewed with band pass filter of 1-70Hz , sensitivity of 7 uV/mm, display speed of 79mm/sec with a 60Hz  notched filter applied as appropriate. EEG data were recorded continuously and digitally stored.  Video monitoring was available and reviewed as appropriate. Description: The posterior dominant rhythm consists of 9 Hz activity of moderate voltage (25-35 uV) seen predominantly in posterior head regions, symmetric and reactive to eye opening and eye closing. Drowsiness was characterized by attenuation of the posterior background rhythm. EEG showed continuous 3 to 6 Hz theta-delta slowing in left frontotemporal region. Sharp waves were noted in left anterior temporal region. Physiologic photic driving was not seen during photic stimulation.  Hyperventilation was not performed.   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 Additionally there is cortical dysfunction arising from left frontotemporal region. No seizures were seen throughout the recording. Dr. Otelia Limes was notified. Charlsie Quest   ECHOCARDIOGRAM COMPLETE  Result Date: 10/26/2022    ECHOCARDIOGRAM REPORT   Patient Name:   Lindsey Huber Date of Exam: 10/26/2022 Medical Rec #:  [id number]        Height:       61.0 in Accession #:    [id number]       Weight:       121.0 lb Date of Birth:  1945-12-10        BSA:          1.526 m Patient Age:    77 years         BP:           151/84 mmHg Patient Gender: F                HR:           69 bpm. Exam Location:  Outpatient Procedure: 2D Echo, Cardiac Doppler and Color Doppler Indications:    I50.9 CHF  History:        Patient has prior history of Echocardiogram examinations, most                 recent 03/18/2022. Defibrillator, PAD; Aortic Valve Disease,                 Mitral Valve Disease and Tricuspid  valve anunuloplasty ring.                 Previous echo revealed LVEF 65% AVA mean gradient 35 mmHg,

## 2022-11-14 NOTE — Plan of Care (Signed)
CHL Tonsillectomy/Adenoidectomy, Postoperative PEDS care plan entered in error.

## 2022-11-16 ENCOUNTER — Ambulatory Visit (INDEPENDENT_AMBULATORY_CARE_PROVIDER_SITE_OTHER): Payer: Medicare HMO

## 2022-11-16 DIAGNOSIS — I4821 Permanent atrial fibrillation: Secondary | ICD-10-CM | POA: Diagnosis not present

## 2022-11-16 DIAGNOSIS — I429 Cardiomyopathy, unspecified: Secondary | ICD-10-CM

## 2022-11-17 LAB — CUP PACEART REMOTE DEVICE CHECK
Battery Remaining Longevity: 42 mo
Battery Voltage: 2.97 V
Brady Statistic RV Percent Paced: 99.58 %
Date Time Interrogation Session: 20241022044224
HighPow Impedance: 58 Ohm
Implantable Lead Connection Status: 753985
Implantable Lead Implant Date: 20120920
Implantable Lead Location: 753862
Implantable Pulse Generator Implant Date: 20190809
Lead Channel Impedance Value: 247 Ohm
Lead Channel Impedance Value: 304 Ohm
Lead Channel Pacing Threshold Amplitude: 0.5 V
Lead Channel Pacing Threshold Pulse Width: 0.4 ms
Lead Channel Sensing Intrinsic Amplitude: 3.75 mV
Lead Channel Sensing Intrinsic Amplitude: 7.875 mV
Lead Channel Setting Pacing Amplitude: 1.25 V
Lead Channel Setting Pacing Pulse Width: 0.4 ms
Lead Channel Setting Sensing Sensitivity: 0.3 mV
Zone Setting Status: 755011
Zone Setting Status: 755011

## 2022-11-30 ENCOUNTER — Ambulatory Visit (INDEPENDENT_AMBULATORY_CARE_PROVIDER_SITE_OTHER): Payer: Medicare HMO | Admitting: Internal Medicine

## 2022-11-30 ENCOUNTER — Encounter: Payer: Self-pay | Admitting: Internal Medicine

## 2022-11-30 VITALS — BP 126/84 | HR 89 | Ht 63.0 in | Wt 120.0 lb

## 2022-11-30 DIAGNOSIS — E2749 Other adrenocortical insufficiency: Secondary | ICD-10-CM

## 2022-11-30 DIAGNOSIS — E039 Hypothyroidism, unspecified: Secondary | ICD-10-CM

## 2022-11-30 DIAGNOSIS — E042 Nontoxic multinodular goiter: Secondary | ICD-10-CM

## 2022-11-30 MED ORDER — PREDNISONE 5 MG PO TABS: 5.0000 mg | ORAL_TABLET | Freq: Every day | ORAL | 3 refills | Status: DC

## 2022-11-30 NOTE — Patient Instructions (Addendum)
-   STOP Levothyroxine

## 2022-11-30 NOTE — Progress Notes (Signed)
Name: Lindsey Huber  MRN/ DOB: [id number], 05-Oct-1945    Age/ Sex: 77 y.o., female    PCP: Collene Mares, [name]   Reason for Endocrinology Evaluation: MNG, adrenal insufficiency      Date of Initial Endocrinology Evaluation: 08/20/2020    HPI: [name] is a 77 y.o. female with a past medical history of MNG, Gout, Adrenal Insufficiency, CHF, gout  and SLE. The patient presented for initial endocrinology clinic visit on 08/20/2020 for consultative assistance with her MNG/ adrenal Insufficiency .    Moved from [place]    MNG History:  Has been diagnosed with MNG years ago. She is not sure of thyroid biopsies in the past . Has been on LT-4 replacement daily    No Fh of thyroid disease   Thyroid ultrasound on 08/29/2020 revealing multinodular goiter with a peripherally calcified nodule in the left mid gland meets consensus criteria to consider fine-needle aspiration biopsy.   She is S/P benign FNA 04/2021    Adrenal History :  She was started on [facility] in 02/2020 following an admission for septic shock . Prior to that she was on Prednisone for years , she was on this though rheumatology for RA   ACTH was at the low end of normal at 6 PG/mL with low normal plasma cortisol at 3.7 UG/DL 04/979    SUBJECTIVE:    Today (11/30/22):  Lindsey Huber is here for follow-up on multinodular goiter and secondary adrenal insufficiency She is accompanied by her daughter Lindsey Huber    She had recent labs in outside facility with a TSH of 0.295 uIU/mL   Denies local neck swelling  , dysphagia with medications as well as hoarseness.  Patient does have GERD, she has been evaluated by GI per daughter, not a candidate for  EGD Has occasional palpitations  Has rare tremors  Has constipation  Denies dizziness  NO Biotin  Has medical alert bracelet     HOME ENDOCRINE MEDICATIONS : Prednisone 5 mg daily Levothyroxine 25 mcg daily     HISTORY:  Past Medical History:  Past  Medical History:  Diagnosis Date   Achalasia    Acquired hypothyroidism 08/20/2020   Acute metabolic encephalopathy 07/25/2021   AICD (automatic cardioverter/defibrillator) present 11/14/2019   Allergic rhinitis    Ankle fracture 03/25/2022   Aortic valve disorder 03/26/2002   Atherosclerosis of abdominal aorta (HCC) 05/01/2020   Cardiomyopathy (HCC)    CHB (complete heart block) (HCC) 08/18/2017   CHF (congestive heart failure) (HCC)    Cholelithiasis without obstruction 05/01/2020   Chronic gouty arthritis    Chronic heart failure with preserved ejection fraction (HCC) 05/01/2020   Chronic kidney disease (CKD), active medical management without dialysis, stage 3 (moderate) (HCC) 05/01/2020   Closed torus fracture of distal end of right radius with delayed healing 08/12/2021   Delusional thoughts (HCC)    Diverticulosis of colon 05/01/2020   Epistaxis    Essential (primary) hypertension 08/18/2017   Gastroesophageal reflux disease 05/01/2020   Gastrointestinal hemorrhage    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 11/14/2019   Hypercoagulability due to atrial fibrillation (HCC) 05/01/2020   Hyperlipidemia 11/14/2019   Hypocalcemia 12/14/2020   Hypoglycemia    Hypokalemia 12/14/2020   Hypomagnesemia 12/14/2020   Hypotension 12/14/2020   Idiopathic pulmonary fibrosis (HCC) 05/01/2020   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   Iron deficiency anemia    Long term (current) use of anticoagulants    Malnutrition of mild degree [name]: 75%  to less than 90% of standard weight) (HCC)    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/12/2021   Mitral and aortic incompetence 11/14/2019   Non-rheumatic atrial fibrillation (HCC) 05/01/2020   Non-toxic multinodular goiter 08/20/2020   NSVT (nonsustained ventricular tachycardia) (HCC) 08/18/2017   Oropharyngeal dysphagia    Osteoarthritis of hip 05/01/2020   Osteoarthritis of knee 05/01/2020   Osteopenia of neck of left femur    Pain of  left hip joint 12/12/2017   Proteinuria 05/01/2020   Raynaud's disease    Recurrent falls 04/09/2021   Rheumatoid arthritis (HCC)    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   Septic shock (HCC) 03/10/2020   Slow transit constipation    Systemic lupus erythematosus (HCC) 05/01/2020   Thrombophilia (HCC)    Transient ischemic attack    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   Vitamin D deficiency    Past Surgical History:  Past Surgical History:  Procedure Laterality Date   BREAST BIOPSY Left    CARDIAC VALVE SURGERY     CARPAL TUNNEL RELEASE     COLONOSCOPY WITH PROPOFOL N/A 12/20/2020   Procedure: COLONOSCOPY WITH PROPOFOL;  Surgeon: Willis Modena, MD;  Location: [facility] ENDOSCOPY;  Service: Endoscopy;  Laterality: N/A;   HEMORRHOID SURGERY     PACEMAKER INSERTION     RIGHT/LEFT HEART CATH AND CORONARY ANGIOGRAPHY N/A 11/27/2020   Procedure: RIGHT/LEFT HEART CATH AND CORONARY ANGIOGRAPHY;  Surgeon: Dolores Patty, MD;  Location: [facility] INVASIVE CV LAB;  Service: Cardiovascular;  Laterality: N/A;   TONSILLECTOMY      Social History:  reports that she has never smoked. She has been exposed to tobacco smoke. She has never used smokeless tobacco. She reports that she does not drink alcohol and does not use drugs. Family History: family history includes Cancer in her brother; Diabetes in her brother; Heart disease in her father, mother, and sister; Hypertension in her father and mother; Osteoarthritis in her father and mother; Rheum arthritis in her mother.   HOME MEDICATIONS: Allergies as of 11/30/2022       Reactions   Other Other (See Comments)   Patient is to NOT EAT any foods with husks or tree nuts, popcorn   Propofol Shortness Of Breath, Other (See Comments)   "Caused asthma"   Flecainide Other (See Comments)   Reaction not recalled   Amiodarone Other (See Comments)   Delirium/Confusion/Psychosis   Amlodipine Other (See  Comments)   "Tired and syncope"        Medication List        Accurate as of November 30, 2022  9:49 AM. If you have any questions, ask your nurse or doctor.          acetaminophen 500 MG tablet Commonly known as: TYLENOL Take 1,000 mg by mouth every 6 (six) hours as needed (pain).   albuterol 108 (90 Base) MCG/ACT inhaler Commonly known as: VENTOLIN HFA Inhale 2 puffs into the lungs every 6 (six) hours as needed for wheezing or shortness of breath.   allopurinol 100 MG tablet Commonly known as: ZYLOPRIM TAKE 1 TABLET BY MOUTH EVERY DAY   amoxicillin 500 MG capsule Commonly known as: AMOXIL 4 capsules by mouth 1 hour before dental procedure.   apixaban 2.5 MG Tabs tablet Commonly known as: ELIQUIS Take 1 tablet (2.5 mg total) by mouth 2 (two) times daily.   Breztri Aerosphere 160-9-4.8 MCG/ACT Aero Generic drug: Budeson-Glycopyrrol-Formoterol Inhale 2 puffs into the lungs 2 (two) times daily as needed ("  for flares").   calcium-vitamin D 500-5 MG-MCG tablet Commonly known as: OSCAL WITH D Take 2 tablets by mouth 2 (two) times daily.   cetirizine HCl 5 MG/5ML Soln Commonly known as: Zyrtec Take 1 mL by mouth 2 (two) times daily as needed for rhinitis or allergies.   DRY EYE RELIEF DROPS OP Place 1 drop into both eyes daily as needed (for dry eye).   fluticasone 50 MCG/ACT nasal spray Commonly known as: FLONASE PLACE 1 SPRAY INTO BOTH NOSTRILS 2 (TWO) TIMES DAILY What changed: See the new instructions.   furosemide 40 MG tablet Commonly known as: LASIX Take 1 tablet (40 mg total) by mouth daily.   hydroxychloroquine 200 MG tablet Commonly known as: PLAQUENIL TAKE 1 TABLET BY MOUTH EVERY DAY   Klor-Con M10 10 MEQ tablet Generic drug: potassium chloride TAKE 2 TABLETS BY MOUTH DAILY   levETIRAcetam 500 MG tablet Commonly known as: KEPPRA Take 1 tablet (500 mg total) by mouth 2 (two) times daily.   levothyroxine 25 MCG tablet Commonly known as:  SYNTHROID Take 1 tablet (25 mcg total) by mouth daily.   magnesium oxide 400 (240 Mg) MG tablet Commonly known as: MAG-OX TAKE 1 TABLET BY MOUTH EVERY DAY   MiraLax 17 GM/SCOOP powder Generic drug: polyethylene glycol powder Take 17 g by mouth daily as needed for mild constipation.   Ofev 150 MG Caps Generic drug: Nintedanib Take 150 mg by mouth at bedtime.   OLANZapine 2.5 MG tablet Commonly known as: ZYPREXA TAKE 1 TABLET BY MOUTH EVERYDAY AT BEDTIME What changed: See the new instructions.   pantoprazole 40 MG tablet Commonly known as: Protonix Take 1 tablet (40 mg total) by mouth 2 (two) times daily.   predniSONE 5 MG tablet Commonly known as: DELTASONE TAKE 1 TABLET (5 MG TOTAL) BY MOUTH DAILY WITH BREAKFAST. PATIENT TO DOUBLE UP DOSE DURING SICK DAY RULE   Vitamin D3 25 MCG (1000 UT) Caps Take 1,000 Units by mouth daily with lunch.          REVIEW OF SYSTEMS: A comprehensive ROS was conducted with the patient and is negative except as per HPI    OBJECTIVE:  VS: BP 126/84 (BP Location: Left Arm, Patient Position: Sitting, Cuff Size: Small)   Pulse 89   Ht 5\' 3"  (1.6 m)   Wt 120 lb (54.4 kg)   SpO2 95%   BMI 21.26 kg/m    Wt Readings from Last 3 Encounters:  11/30/22 120 lb (54.4 kg)  11/11/22 128 lb 15.5 oz (58.5 kg)  10/26/22 119 lb (54 kg)     EXAM: General: Pt appears well and is in NAD  Neck: General: Supple without adenopathy. Thyroid: Thyroid size normal.  Left thyroid nodule appreciated  Lungs: Clear with good BS bilat with no rales, rhonchi, or wheezes  Heart: Auscultation: RRR.  Extremities:  BL LE: No pretibial edema normal  Mental Status: Judgment, insight: Intact     DATA REVIEWED:  Latest Reference Range & Units 11/10/22 20:44  Sodium 135 - 145 mmol/L 136  Potassium 3.5 - 5.1 mmol/L 4.5  Chloride 98 - 111 mmol/L 105  CO2 22 - 32 mmol/L 20 (L)  Glucose 70 - 99 mg/dL 093 (H)  BUN 8 - 23 mg/dL 21  Creatinine 8.18 - 2.99 mg/dL  3.71 (H)  Calcium 8.9 - 10.3 mg/dL 8.8 (L)  Anion gap 5 - 15  11  Alkaline Phosphatase 38 - 126 U/L 67  Albumin 3.5 - 5.0 g/dL  2.4 (L)  AST 15 - 41 U/L 28  ALT 0 - 44 U/L 16  Total Protein 6.5 - 8.1 g/dL 6.1 (L)  Total Bilirubin 0.3 - 1.2 mg/dL 0.4  GFR, Estimated >44 mL/min 50 (L)    Thyroid ultrasound 12/10/2021 Estimated total number of nodules >/= 1 cm: 2   Number of spongiform nodules >/=  2 cm not described below (TR1): 0   Number of mixed cystic and solid nodules >/= 1.5 cm not described below (TR2): 0   _________________________________________________________   Nodule 1: 2.2 x 1.4 x 1.3 cm heterogeneous region in the mid right thyroid lobe is unchanged since prior examination. This again is favored to be a pseudo nodule given lack of define margins.   _________________________________________________________   Nodule 2: 0.8 x 0.7 x 0.4 cm hypoechoic solid nodule in the medial mid right thyroid lobe does not meet criteria for imaging surveillance or FNA.   _________________________________________________________   Nodule 3: 1.7 x 1.6 x 1.5 cm solid hypoechoic nodule in the mid left thyroid lobe is unchanged in size since prior examination. Please correlate with prior FNA results from 05/20/2021.   IMPRESSION: Previously biopsied left mid thyroid nodule is unchanged in size since prior examination. Please correlate with prior FNA results from 05/20/2021.     FNA left mid nodule 05/20/2021    Clinical History: Left mid 2.0cm; Other 2 dimensions: 1.6 x 1.5cm, Solid  /almost completely solid, peripheral calcifications, TI-RADS total  points 5  Specimen Submitted:  A. THYROID, LEFT MID, FINE NEEDLE ASPIRATION:    FINAL MICROSCOPIC DIAGNOSIS:  - Consistent with benign follicular nodule (Bethesda category II)    ASSESSMENT/PLAN/RECOMMENDATIONS:   Multinodular Goiter :   -Patient with occasional local neck symptoms -She is s/p benign FNA of the left  nodule 04/2021 -Will proceed with repeat thyroid ultrasound   2. Hypothyroidism:  -TSH is low, will discontinue levothyroxine -Will recheck TFTs in 2 months   Medication Stop levothyroxine 25 MCG daily   3.  Secondary adrenal insufficiency:   -This is due to chronic use of glucocorticoids for rheumatoid arthritis.  She was on prednisone for years which was switched to hydrocortisone since her hospitalization in [place] in 02/2020 -She has a medical alert bracelet -We discussed sick day will   Medication  Continue prednisone 5 mg daily with breakfast    Follow-up in 1 yr       Signed electronically by: Lyndle Herrlich, MD  [facility] Endocrinology  [facility] [address] [place] [place], [place] [address] Phone: [phone] FAX: [phone]   CC: Collene Mares, [name] [address] [place] [address] Phone: [phone] Fax: [phone]   Return to Endocrinology clinic as below: Future Appointments  Date Time Provider Department Center  11/30/2022  9:50 AM Emilyanne Mcgough, Konrad Dolores, MD [facility] None  12/07/2022  1:30 PM Jeanella Craze, NP [facility]  12/08/2022  2:30 PM Drema Dallas, DO [facility] None  12/13/2022  9:15 AM Hunsucker, Lesia Sago, MD [facility] None  01/05/2023  2:20 PM Fuller Plan, MD [facility] None  02/15/2023  7:00 AM [facility] DEVICE REMOTES [facility]  07/11/2023  3:30 PM Drema Dallas, DO [facility] None

## 2022-12-02 ENCOUNTER — Other Ambulatory Visit: Payer: Medicare HMO

## 2022-12-03 NOTE — Progress Notes (Signed)
Remote ICD transmission.   

## 2022-12-06 NOTE — Progress Notes (Unsigned)
Electrophysiology Office Note:   Date:  12/07/2022  ID:  Lindsey Huber, DOB 1945/10/11, MRN [id number]  Primary Cardiologist: None Electrophysiologist: Lanier Prude, MD      History of Present Illness:   Lindsey Huber is a 77 y.o. female with h/o ICM s/p ICD, permanent AF on OAC, VHD s/p aortic, mitral & tricuspid valve repair, VT (torsades in setting of flecainide), HFpEF, HTN, HLD, CKD III, SLE, Lewy Body dementia, idiopathic pulmonary fibrosis, GERD, dysphagia, malnutrition seen today for routine electrophysiology followup.   Admitted to hospital from 6/6-6/15 in the setting of hemorrhagic shock due to lower diverticular GIB. Required 4 units PRBC, KCentra & Vitamin K.   Recent admit from 10/8-10/17/24 for GIB, ABLA.  Required KCentra for DOAC reversal, vasopressors and 2 units PRBC.   Since last being seen in our clinic the patient reports she has had no further bleeding. Denies blood in stool, dark tarry stools.  Notes slight swelling in LE's.   She denies chest pain, palpitations, dyspnea, PND, orthopnea, nausea, vomiting, dizziness, syncope, weight gain, or early satiety.   Review of systems complete and found to be negative unless listed in HPI.   EP Information / Studies Reviewed:    EKG is ordered today. Personal review as below.     ICD Interrogation-  reviewed in detail today,  See PACEART report.  Device History: Medtronic Single Chamber ICD implanted 10/15/2010 (in [place]), generator change 2019 for ICM History of appropriate therapy: Yes, ATP 2022, HV therapy 2012 History of AAD therapy:  Torsades on Flecainide     Arrhythmia / Device  Medtronic CareLink ICD   05/2006 original device implant, SSS, atrial standstill, advanced Wenckebach, VT/VF arrest (torsades), mixed connective tissue disease  09/2010 MDT single chamber ICD 08/2017 Gen change, possible damage to lead during valvular surgery  CXR shows abandoned RA lead, & broken/abandoned dual coil RV  lead 06/2022 Device Interrogation > normal device function, battery/lead parameters stable, no VT. OptiVol elevated (post hospital). No R waves at 40   AAD / Anticoagulation  01/2020 Established with Dr. Lalla Brothers after move from [facility] to Flecainide > torsades  Xarelto 15mg  > changed to Eliquis due to second occurrence of GIB 10/2022   Studies 11/2020 R/LHC > normal coronary arteries, LVEF 60-65%, very mild PAH, normal left-sided filling pressures with no v-waves in PCWP tracing to suggest hemodynamically significant MR, no significant stenosis AoV, MV, or TV 02/2022 ECHO > LVEF 60-65%, mild concentric LV hypertrophy, bioprosthetic AVR, MVR, RVSP 52.9, LA/RA severely dilated    Risk Assessment/Calculations:    CHA2DS2-VASc Score = 6   This indicates a 9.7% annual risk of stroke. The patient's score is based upon: CHF History: 1 HTN History: 1 Diabetes History: 0 Stroke History: 0 Vascular Disease History: 1 Age Score: 2 Gender Score: 1      Physical Exam:   VS:  BP (!) 148/78   Pulse 72   Ht 5\' 3"  (1.6 m)   Wt 122 lb (55.3 kg)   SpO2 99%   BMI 21.61 kg/m    Wt Readings from Last 3 Encounters:  12/07/22 122 lb (55.3 kg)  11/30/22 120 lb (54.4 kg)  11/11/22 128 lb 15.5 oz (58.5 kg)     GEN: Well nourished, well developed in no acute distress NECK: No JVD; No carotid bruits CARDIAC: Regular rate and rhythm, 3/6 holosystolic murmur, rubs, gallops RESPIRATORY:  Clear to auscultation without rales, wheezing or rhonchi  ABDOMEN: Soft, non-tender, non-distended EXTREMITIES:  trace ankle  edema; No deformity   ASSESSMENT AND PLAN:    CHB/ICM s/p Medtronic single chamber ICD  VT Has abandoned leads. No R waves at 40bpm on 06/2022 check. -largely euvolemic on exam compared to prior, OptiVol headed back down after admission -Stable on an appropriate medical regimen -no R waves at 45 today, VP 99.7% -Normal ICD function -See Pace Art report -No changes  today  Permanent AF  CHA2DS2-VASc 6.  -2 recent GIB requiring reversal of DOAC and PRBC  -was on Xarelto but changed to Eliquis after last admit, dose appropriate for wt/renal function  -high risk falls, recent large hip hematoma & GIB but given her multiple valve replacements / repairs warrants anticoagulation  -assess CBC, BMP given recent bleed / change in [facility] -continue lopressor   Chronic HFpEF  -volume status  -OptiVol elevated on device interrogation, exam consistent as well. 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 Lalla Brothers in 6 months     Signed, Canary Brim, MSN, APRN, NP-C, AGACNP-BC [facility] - Electrophysiology  12/07/2022, 2:12 PM

## 2022-12-07 ENCOUNTER — Encounter: Payer: Self-pay | Admitting: Pulmonary Disease

## 2022-12-07 ENCOUNTER — Ambulatory Visit: Payer: Medicare HMO | Attending: Pulmonary Disease | Admitting: Pulmonary Disease

## 2022-12-07 VITALS — BP 148/78 | HR 72 | Ht 63.0 in | Wt 122.0 lb

## 2022-12-07 DIAGNOSIS — D6869 Other thrombophilia: Secondary | ICD-10-CM

## 2022-12-07 DIAGNOSIS — I472 Ventricular tachycardia, unspecified: Secondary | ICD-10-CM | POA: Diagnosis not present

## 2022-12-07 DIAGNOSIS — I5032 Chronic diastolic (congestive) heart failure: Secondary | ICD-10-CM

## 2022-12-07 DIAGNOSIS — I4821 Permanent atrial fibrillation: Secondary | ICD-10-CM

## 2022-12-07 DIAGNOSIS — Z79899 Other long term (current) drug therapy: Secondary | ICD-10-CM

## 2022-12-07 LAB — CUP PACEART INCLINIC DEVICE CHECK
Battery Remaining Longevity: 41 mo
Battery Voltage: 2.97 V
Brady Statistic RV Percent Paced: 99.69 %
Date Time Interrogation Session: 20241112141922
HighPow Impedance: 54 Ohm
Implantable Lead Connection Status: 753985
Implantable Lead Implant Date: 20120920
Implantable Lead Location: 753862
Implantable Pulse Generator Implant Date: 20190809
Lead Channel Impedance Value: 247 Ohm
Lead Channel Impedance Value: 304 Ohm
Lead Channel Pacing Threshold Amplitude: 0.625 V
Lead Channel Pacing Threshold Pulse Width: 0.4 ms
Lead Channel Sensing Intrinsic Amplitude: 8.625 mV
Lead Channel Sensing Intrinsic Amplitude: 8.625 mV
Lead Channel Setting Pacing Amplitude: 1.25 V
Lead Channel Setting Pacing Pulse Width: 0.4 ms
Lead Channel Setting Sensing Sensitivity: 0.3 mV
Zone Setting Status: 755011
Zone Setting Status: 755011

## 2022-12-07 NOTE — Progress Notes (Unsigned)
NEUROLOGY FOLLOW UP OFFICE NOTE  Lindsey Huber [id number]  Assessment/Plan:    Major neurocognitive disorder - with normal DaT scan, suspect vascular etiology. Focal onset seizures with impaired consciousness   Seizure prophylaxis:  Keppra 500mg  twice daily Continue olanzapine 2.5mg  at bedtime for treatment of hallucinations/agitation.  *** Secondary stroke prevention as managed by cardiiology and primary care. Follow up in 6 months ***     Subjective:  Lindsey Huber is a 77 year old right-handed female with a fib with [employer] ICD, cardiomyopathy, HTN, CHF, lupus, pulmonary fibrosis, untreated OSA, s/p AVR and CKD who follows up for TIA.  ED notes reviewed.  She is accompanied by her daughter who supplements history.  CT head and CTA head and neck in ED personally reviewed.   UPDATE: Current medications:  Xarelto, Lopressor, Lasix, olanzapine 2.5mg  at bedtime  On 10/1, she suddenly became *** and exhibited left sided facial droop.  Speech was ***.  Seen in [facility] ED.  CT head personally reviewed revealed moderate to severe chronic small vessel ischemic changes but no evidence of acute intracranial abnormality.  EEG revealed continuous left frontotemporal slowing with left anterior temporal sharp waves.  She was started on Keppra and discharged.  She returned to the ED on 10/8 and was admitted for lower GI/rectal bleed.  Treated with Kcentra, vit K and received 3 units of PRBC.  Anticoagulation was discontinued with instructions to resume Eliquis in 5 days.  ***  During her hospital admission, she had the DaT scan performed on 11/11/2022, which was within normal limits.  HISTORY:  On 07/01/2020, the patient experienced a mild headache off and on during the day.  She also had a left temporal headache and noted intermittent numbness and tingling in the bilateral lower extremities.  That evening, she was talking with family when she suddenly started holding the right side of her  face, eyes became big and she started drooling.  When her family spoke to her, she could only moan.  She was unable to repeat.  She could not smile but did not exhibit facial droop or unilateral weakness.  After 5-6 minutes, she started speaking but it was slurred and didn't make sense.  She was unable to repeat phrases.  She has no recollection of this.  No convulsions or incontinence  She was brought to the ED.  CTA of head and neck personally reviewed was negative for large vessel occlusion or hemodynamically significant stenosis.  Unable to have an MRI due to pacemaker.  She has a fib which is treated with Xarelto.  Routine awake and asleep EEG on 07/14/2020 was normal.   On the morning of 03/14/2021, she was noted by her daughter to be acting strange.  When she spoke, she had trouble putting words together and did not make sense.  After about an hour, she became unresponsive, started staring off, making moaning sounds and exhibited head twitching and shaking of both hands.  Lasted 2-3 minutes.  No incontinence or tongue biting.  She continued having some slurred speech and difficulty getting words out afterwards.  In hindsight, she says that she does remember feeling strange during this event and struggling to talk.  No lateralizing symptoms such as facial droop or unilateral limb weakness.  EMS was called and symptoms resolved en route to the ED.  2 days prior, she had a fall and landed on her left arm.  CT head personally reviewed revealed no acute intracranial abnormality.  MRI was unable to  be performed because there were no [employer] reps that day who could come to the hospital to turn off the pacemaker.  She was found to have cellulitis on her left medial forearm where she had fallen.  Labs revealed no leukocytes and electrolytes normal.  She was discharged on Keflex.    On 09/29/2022, she was sitting at the table when she suddenly started drooling with slight right sided facial droop and slurred speech.   No extremity weakness.  Symptoms lasted 15 minutes.  She was seen in the ED.  CT head revealed advanced chronic small vessel ischemic changes but no acute stroke.  CTA head and neck revealed tortuosity of the intracranial and extracranial arteries and mild to moderate atheromatous changes within the carotid bifurcations but no LVO or hemodynamically significant stenosis.  As she was already on maximal medical management and back to baseline, she was discharged.   Beginning in 2022, her daughter has been concerned about dementia.  She has been having both visual and auditory hallucinations.  For example, she saw a large spider web in her house that she said was put up by somebody.   She sometimes hears people outside the window.  She also may exhibit paranoid delusions such as thinking her cousin is calling into radio stations and talking about her, or that her conversations are being broadcast throughout the apartment complex.  This isn't a frequent occurrence.  She sometimes has tremors of the hands.  She lives with her granddaughter and her daughter stops by everyday.  She is able to perform ADLs independently.  No family history of dementia.  B12 from November 2022 was 1,406.  TSH from January 2023 was 1.470.  24 hour ambulatory EEG on 4/5-04/30/2021 was normal.  No events captured.  Unable to get MRI of brain because she has reported abandoned leads.  CT head on 06/18/2021 and  07/14/2021 showed moderate chronic small vessel ischemic changes in the cerebral white matter but no acute findings.  Neuropsychological evaluation on 08/12/2021 was consistent with major neurocognitive disorder possibly indicating early stages of Lewy body dementia or vascular dementia but not suggestive of Alzheimer's, FTD or Parkinson's/atypical parkinson's etiology.  Past medications:  Eliquis (bleeding)  PAST MEDICAL HISTORY: Past Medical History:  Diagnosis Date   Achalasia    Acquired hypothyroidism 08/20/2020   Acute  metabolic encephalopathy 07/25/2021   AICD (automatic cardioverter/defibrillator) present 11/14/2019   Allergic rhinitis    Ankle fracture 03/25/2022   Aortic valve disorder 03/26/2002   Atherosclerosis of abdominal aorta (HCC) 05/01/2020   Cardiomyopathy (HCC)    CHB (complete heart block) (HCC) 08/18/2017   CHF (congestive heart failure) (HCC)    Cholelithiasis without obstruction 05/01/2020   Chronic gouty arthritis    Chronic heart failure with preserved ejection fraction (HCC) 05/01/2020   Chronic kidney disease (CKD), active medical management without dialysis, stage 3 (moderate) (HCC) 05/01/2020   Closed torus fracture of distal end of right radius with delayed healing 08/12/2021   Delusional thoughts (HCC)    Diverticulosis of colon 05/01/2020   Epistaxis    Essential (primary) hypertension 08/18/2017   Gastroesophageal reflux disease 05/01/2020   Gastrointestinal hemorrhage    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 11/14/2019   Hypercoagulability due to atrial fibrillation (HCC) 05/01/2020   Hyperlipidemia 11/14/2019   Hypocalcemia 12/14/2020   Hypoglycemia    Hypokalemia 12/14/2020   Hypomagnesemia 12/14/2020   Hypotension 12/14/2020   Idiopathic pulmonary fibrosis (HCC) 05/01/2020   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  Iron deficiency anemia    Long term (current) use of anticoagulants    Malnutrition of mild degree [name]: 75% to less than 90% of standard weight) (HCC)    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/12/2021   Mitral and aortic incompetence 11/14/2019   Non-rheumatic atrial fibrillation (HCC) 05/01/2020   Non-toxic multinodular goiter 08/20/2020   NSVT (nonsustained ventricular tachycardia) (HCC) 08/18/2017   Oropharyngeal dysphagia    Osteoarthritis of hip 05/01/2020   Osteoarthritis of knee 05/01/2020   Osteopenia of neck of left femur    Pain of left hip joint 12/12/2017   Proteinuria 05/01/2020   Raynaud's disease    Recurrent falls  04/09/2021   Rheumatoid arthritis (HCC)    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     albuterol (VENTOLIN HFA) 108 (90 Base) MCG/ACT inhaler Inhale 2 puffs into the lungs every 6 (six) hours as needed for wheezing or shortness of breath.     allopurinol (ZYLOPRIM) 100 MG tablet TAKE 1 TABLET BY MOUTH EVERY DAY 90 tablet 1   amoxicillin (AMOXIL) 500 MG capsule 4 capsules by mouth 1 hour before dental procedure.     apixaban (ELIQUIS) 2.5 MG TABS tablet Take 1 tablet (2.5 mg total) by mouth 2 (two) times daily. 60 tablet 0   Budeson-Glycopyrrol-Formoterol (BREZTRI AEROSPHERE) 160-9-4.8 MCG/ACT AERO Inhale 2 puffs into the lungs 2 (two) times daily as needed ("for flares").     calcium-vitamin D (OSCAL WITH D) 500-5 MG-MCG tablet Take 2 tablets by mouth 2 (two) times daily. 120 tablet 0   cetirizine HCl (ZYRTEC) 5 MG/5ML SOLN Take 1 mL by mouth 2 (two) times daily as needed for rhinitis or allergies.     Cholecalciferol (VITAMIN D3) 25 MCG (1000 UT) CAPS Take 1,000 Units by mouth daily with lunch.     fluticasone (FLONASE) 50 MCG/ACT nasal spray PLACE 1 SPRAY INTO BOTH NOSTRILS 2 (TWO) TIMES DAILY (Patient taking differently: Place 1 spray into both nostrils daily as needed for allergies.) 48 mL 2   furosemide (LASIX) 40 MG tablet Take 1 tablet (40 mg total) by mouth daily. 180 tablet 3   Glycerin-Hypromellose-PEG 400 (DRY EYE RELIEF DROPS OP) Place 1 drop into both eyes daily as needed (for dry eye).      hydroxychloroquine (PLAQUENIL) 200 MG tablet TAKE 1 TABLET BY MOUTH EVERY DAY 90 tablet 0   levETIRAcetam (KEPPRA) 500 MG tablet Take 1 tablet (500 mg total) by mouth 2 (two) times daily. 60 tablet 2   magnesium oxide (MAG-OX) 400 (240 Mg) MG tablet TAKE 1 TABLET BY MOUTH EVERY DAY 90 tablet 3   Nintedanib (OFEV) 150 MG CAPS Take 150 mg by mouth at bedtime.     OLANZapine (ZYPREXA) 2.5 MG tablet TAKE 1 TABLET BY MOUTH EVERYDAY AT BEDTIME (Patient taking differently: Take 2.5 mg by mouth at bedtime.) 90 tablet 0   pantoprazole (PROTONIX) 40 MG tablet Take 1 tablet (40 mg total) by mouth 2 (two) times daily. 60 tablet 1   polyethylene glycol powder (MIRALAX) 17  GM/SCOOP powder Take 17 g by mouth daily as needed for mild constipation.     potassium chloride (KLOR-CON M10) 10 MEQ tablet TAKE 2 TABLETS BY MOUTH DAILY 180 tablet 2   predniSONE (DELTASONE) 5 MG tablet Take 1 tablet (5 mg total) by mouth daily with breakfast. Patient to double up dose during sick day rule 100 tablet 3   No current facility-administered medications on file prior to visit.    ALLERGIES: Allergies  Allergen Reactions   Other Other (See Comments)    Patient is to NOT EAT any foods with husks or tree nuts, popcorn   Propofol Shortness Of Breath and Other (See Comments)    "Caused asthma"   Flecainide Other (See Comments)    Reaction not recalled   Amiodarone Other (See Comments)    Delirium/Confusion/Psychosis   Amlodipine Other (See Comments)    "Tired and syncope"    FAMILY HISTORY: Family History  Problem Relation Age of Onset   Heart disease Mother    Hypertension Mother    Rheum arthritis Mother    Osteoarthritis Mother    Heart disease Father    Hypertension Father    Osteoarthritis Father    Heart disease Sister    Diabetes Brother    Cancer Brother       Objective:  *** General: No acute distress.  Patient appears well-groomed.   Head:  Normocephalic/atraumatic Eyes:  Fundi examined but not  visualized Neck: supple, no paraspinal tenderness, full range of motion Heart:  Regular rate and rhythm Neurological Exam: alert and oriented to person, place, and time.  Speech fluent and not dysarthric, language intact.  CN II-XII intact. Increased tone in right wrist and elbow, muscle strength 5-/5 throughout.  No tremor.  Sensation to light touch intact.  Deep tendon reflexes 1+ throughout  Finger to nose testing intact.  In wheelchair.  Gait cautious broad-based gait with short stride, ambulates with cane. ***   Shon Millet, DO  CC: Evangeline Dakin, [name]

## 2022-12-07 NOTE — Patient Instructions (Addendum)
Medication Instructions:  Your physician recommends that you continue on your current medications as directed. Please refer to the Current Medication list given to you today.  *If you need a refill on your cardiac medications before your next appointment, please call your pharmacy*  Lab Work: CBC and BMET -- Today  If you have labs (blood work) drawn today and your tests are completely normal, you will receive your results only by: MyChart Message (if you have MyChart) OR A paper copy in the mail If you have any lab test that is abnormal or we need to change your treatment, we will call you to review the results.  Testing/Procedures: None ordered.  Follow-Up: At [facility], you and your health needs are our priority.  As part of our continuing mission to provide you with exceptional heart care, we have created designated Provider Care Teams.  These Care Teams include your primary Cardiologist (physician) and Advanced Practice Providers (APPs -  Physician Assistants and Nurse Practitioners) who all work together to provide you with the care you need, when you need it.    Your next appointment:   6 months  The format for your next appointment:   In Person    Important Information About Sugar

## 2022-12-08 ENCOUNTER — Ambulatory Visit
Admission: RE | Admit: 2022-12-08 | Discharge: 2022-12-08 | Disposition: A | Discharge status: Home / Self Care | Payer: Medicare HMO | Source: Ambulatory Visit | Attending: Internal Medicine | Admitting: Internal Medicine

## 2022-12-08 ENCOUNTER — Encounter: Payer: Self-pay | Admitting: Neurology

## 2022-12-08 ENCOUNTER — Ambulatory Visit (INDEPENDENT_AMBULATORY_CARE_PROVIDER_SITE_OTHER): Payer: Medicare HMO | Admitting: Neurology

## 2022-12-08 VITALS — BP 140/80 | HR 90 | Resp 20 | Wt 120.0 lb

## 2022-12-08 DIAGNOSIS — G40209 Localization-related (focal) (partial) symptomatic epilepsy and epileptic syndromes with complex partial seizures, not intractable, without status epilepticus: Secondary | ICD-10-CM

## 2022-12-08 DIAGNOSIS — F01B18 Vascular dementia, moderate, with other behavioral disturbance: Secondary | ICD-10-CM | POA: Diagnosis not present

## 2022-12-08 DIAGNOSIS — E042 Nontoxic multinodular goiter: Secondary | ICD-10-CM

## 2022-12-08 LAB — BASIC METABOLIC PANEL
BUN/Creatinine Ratio: 22 (ref 12–28)
BUN: 25 mg/dL (ref 8–27)
CO2: 24 mmol/L (ref 20–29)
Calcium: 10 mg/dL (ref 8.7–10.3)
Chloride: 103 mmol/L (ref 96–106)
Creatinine, Ser: 1.15 mg/dL — ABNORMAL HIGH (ref 0.57–1.00)
Glucose: 70 mg/dL (ref 70–99)
Potassium: 4 mmol/L (ref 3.5–5.2)
Sodium: 144 mmol/L (ref 134–144)
eGFR: 50 mL/min/{1.73_m2} — ABNORMAL LOW (ref >59)

## 2022-12-08 LAB — CBC
Hematocrit: 33.3 % — ABNORMAL LOW (ref 34.0–46.6)
Hemoglobin: 10.4 g/dL — ABNORMAL LOW (ref 11.1–15.9)
MCH: 31.1 pg (ref 26.6–33.0)
MCHC: 31.2 g/dL — ABNORMAL LOW (ref 31.5–35.7)
MCV: 100 fL — ABNORMAL HIGH (ref 79–97)
Platelets: 167 10*3/uL (ref 150–450)
RBC: 3.34 x10E6/uL — ABNORMAL LOW (ref 3.77–5.28)
RDW: 15.4 % (ref 11.7–15.4)
WBC: 6.2 10*3/uL (ref 3.4–10.8)

## 2022-12-08 NOTE — Patient Instructions (Addendum)
Levetiraceram 500mg  twice daily Olanzapine at bedtime

## 2022-12-13 ENCOUNTER — Telehealth: Payer: Self-pay | Admitting: Pulmonary Disease

## 2022-12-13 ENCOUNTER — Ambulatory Visit (INDEPENDENT_AMBULATORY_CARE_PROVIDER_SITE_OTHER): Payer: Medicare HMO | Admitting: Pulmonary Disease

## 2022-12-13 ENCOUNTER — Encounter: Payer: Self-pay | Admitting: Pulmonary Disease

## 2022-12-13 VITALS — BP 148/84 | HR 89 | Temp 97.7°F | Ht 63.0 in | Wt 119.0 lb

## 2022-12-13 DIAGNOSIS — J849 Interstitial pulmonary disease, unspecified: Secondary | ICD-10-CM | POA: Diagnosis not present

## 2022-12-13 DIAGNOSIS — J449 Chronic obstructive pulmonary disease, unspecified: Secondary | ICD-10-CM | POA: Diagnosis not present

## 2022-12-13 MED ORDER — OFEV 150 MG PO CAPS: 150.0000 mg | ORAL_CAPSULE | Freq: Every day | ORAL | 11 refills | Status: DC

## 2022-12-13 NOTE — Telephone Encounter (Signed)
Patient requesting assistance with cost of medication OFEV. Can you see what may be available?

## 2022-12-13 NOTE — Progress Notes (Signed)
@Patient  ID: Lindsey Huber, female    DOB: [birth year], 77 y.o.   MRN: [id number]  Chief Complaint  Patient presents with   Follow-up    SOB persistent since D/C from hospital.  Some wheezing.    Referring provider: Collene Mares, PA  HPI:   77 y.o. woman whom I am seeing in follow up and ongoing care of DOE and ILD.  Multiple discharge summaries 2024 reviewed.  Most recent cardiology note reviewed.  Still quite dyspneic.  Using [place] twice a day.  Sometimes it helps.  Seem to help some.  Not quite sure.  Hospitalized multiple times this year for GI bleeding.  Significant anemia.  Discussed this likely contributes to her symptoms.  Both with worsening anemia.  Fortunate most recent hemoglobin on review has improved.  This is encouraging.  But also concern for worsening deconditioning given multiple hospitalizations, relative lack of activity in this setting as well as feeling short of breath.  Discussed repeating CT scan to surveilled her ILD.  Last CT scan 2022 in [place].  She reports good adherence to Ofev.  Discussed continuing this.  HPI at initial visit: Patient recently moved to the area from [place].  Had multiple specialist including cardiology, pulmonary.  Chronic dyspnea for some time.  Diagnosed with RA related ILD.  Started on Ofev in the past.  Continues this.  Occasional diarrhea.  Dyspnea worse on inclines or stairs.  Present on flat surfaces over short distances as well.  No time of day when things are better or worse.  Dyspnea constant.  No seasonal environmental factors she can identify that make this better or worse.  No position make things better or worse.  No clear alleviating exacerbating factors.  She continues on inhalers.  Currently Trelegy.  PMH: ILD, valvular issues, heart issues, Surgical history: Heart valve surgery, pacemaker placement, tonsillectomy Family history: Mother had CAD, hypertension Father with CAD, hypertension Social history:  Never smoker, recently moved to the area, lives in [place] / Pulmonary Flowsheets:   ACT:      No data to display          MMRC:     No data to display          Epworth:      No data to display          Tests:   FENO:  No results found for: "NITRICOXIDE"  PFT:     No data to display          WALK:     08/22/2020    2:52 PM  SIX MIN WALK  Medications levothyroxine at 6am, allopurinol 100mg , hydroxychloroquine 200mg , prednisone 5mg  at 7am, and multivitamin at 12pm  Supplimental Oxygen during Test? (L/min) No  Laps 6  Partial Lap (in Meters) 0 meters  Baseline BP (sitting) 120/72  Baseline Heartrate 91  Baseline Dyspnea (Borg Scale) 3  Baseline Fatigue (Borg Scale) 2  Baseline SPO2 100 %  BP (sitting) 150/100  Heartrate 95  Dyspnea (Borg Scale) 6  Fatigue (Borg Scale) 4  SPO2 97 %  BP (sitting) 138/90  Heartrate 90  SPO2 100 %  Stopped or Paused before Six Minutes Yes  Other Symptoms at end of Exercise paused with 1 min 50 sec remaining for 2 minutes due to needing to catch her breath  Distance Completed 204 meters  Distance Completed 0 meters  Tech Comments: pt walked at a slow pace with a left knee brace on,  stumbled multiple times against the wall due to the left knee and brace. pt paused with 1 min 50 sec remaining for 2 min to catch breath but was able to fully finish the walk.   Imaging: None reviewed, requested  Lab Results:  CBC Personally reviewed most recent labs from PCP with minimal records sent    Component Value Date/Time   WBC 6.2 12/07/2022 1432   WBC 8.5 11/10/2022 0946   RBC 3.34 (L) 12/07/2022 1432   RBC 2.55 (L) 11/10/2022 0946   HGB 10.4 (L) 12/07/2022 1432   HCT 33.3 (L) 12/07/2022 1432   PLT 167 12/07/2022 1432   MCV 100 (H) 12/07/2022 1432   MCH 31.1 12/07/2022 1432   MCH 31.0 11/10/2022 0946   MCHC 31.2 (L) 12/07/2022 1432   MCHC 32.1 11/10/2022 0946   RDW 15.4 12/07/2022 1432    LYMPHSABS 2.7 11/02/2022 1523   MONOABS 0.6 11/02/2022 1523   EOSABS 0.0 11/02/2022 1523   BASOSABS 0.0 11/02/2022 1523    BMET    Component Value Date/Time   NA 144 12/07/2022 1432   K 4.0 12/07/2022 1432   CL 103 12/07/2022 1432   CO2 24 12/07/2022 1432   GLUCOSE 70 12/07/2022 1432   GLUCOSE 104 (H) 11/10/2022 2044   BUN 25 12/07/2022 1432   CREATININE 1.15 (H) 12/07/2022 1432   CREATININE 1.11 (H) 05/03/2022 1519   CALCIUM 10.0 12/07/2022 1432   GFRNONAA 50 (L) 11/10/2022 2044    BNP    Component Value Date/Time   BNP 718.0 (H) 12/26/2021 1350    ProBNP No results found for: "PROBNP"  Specialty Problems       Pulmonary Problems   Idiopathic pulmonary fibrosis   Allergic rhinitis   Epistaxis   Oropharyngeal dysphagia    Allergies  Allergen Reactions   Other Other (See Comments)    Patient is to NOT EAT any foods with husks or tree nuts, popcorn   Propofol Shortness Of Breath and Other (See Comments)    "Caused asthma"   Flecainide Other (See Comments)    Reaction not recalled   Amiodarone Other (See Comments)    Delirium/Confusion/Psychosis   Amlodipine Other (See Comments)    "Tired and syncope"    Immunization History  Administered Date(s) Administered   Fluad Trivalent(High Dose 65+) 11/03/2022   Influenza-Unspecified 10/26/2019, 10/07/2021   PFIZER(Purple Top)SARS-COV-2 Vaccination 04/17/2019, 05/08/2019, 11/24/2019    Past Medical History:  Diagnosis Date   Achalasia    Acquired hypothyroidism 08/20/2020   Acute metabolic encephalopathy 07/25/2021   AICD (automatic cardioverter/defibrillator) present 11/14/2019   Allergic rhinitis    Ankle fracture 03/25/2022   Aortic valve disorder 03/26/2002   Atherosclerosis of abdominal aorta (HCC) 05/01/2020   Cardiomyopathy (HCC)    CHB (complete heart block) (HCC) 08/18/2017   CHF (congestive heart failure) (HCC)    Cholelithiasis without obstruction 05/01/2020   Chronic gouty arthritis     Chronic heart failure with preserved ejection fraction (HCC) 05/01/2020   Chronic kidney disease (CKD), active medical management without dialysis, stage 3 (moderate) (HCC) 05/01/2020   Closed torus fracture of distal end of right radius with delayed healing 08/12/2021   Delusional thoughts (HCC)    Diverticulosis of colon 05/01/2020   Epistaxis    Essential (primary) hypertension 08/18/2017   Gastroesophageal reflux disease 05/01/2020   Gastrointestinal hemorrhage    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 11/14/2019   Hypercoagulability due to atrial fibrillation (HCC) 05/01/2020  Hyperlipidemia 11/14/2019   Hypocalcemia 12/14/2020   Hypoglycemia    Hypokalemia 12/14/2020   Hypomagnesemia 12/14/2020   Hypotension 12/14/2020   Idiopathic pulmonary fibrosis (HCC) 05/01/2020   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   Iron deficiency anemia    Long term (current) use of anticoagulants    Malnutrition of mild degree [name]: 75% to less than 90% of standard weight) (HCC)    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/12/2021   Mitral and aortic incompetence 11/14/2019   Non-rheumatic atrial fibrillation (HCC) 05/01/2020   Non-toxic multinodular goiter 08/20/2020   NSVT (nonsustained ventricular tachycardia) (HCC) 08/18/2017   Oropharyngeal dysphagia    Osteoarthritis of hip 05/01/2020   Osteoarthritis of knee 05/01/2020   Osteopenia of neck of left femur    Pain of left hip joint 12/12/2017   Proteinuria 05/01/2020   Raynaud's disease    Recurrent falls 04/09/2021   Rheumatoid arthritis (HCC)    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   albuterol (VENTOLIN HFA) 108 (90 Base) MCG/ACT inhaler Inhale 2 puffs into the lungs every 6 (six) hours as needed for wheezing or shortness of breath.   allopurinol (ZYLOPRIM) 100 MG tablet TAKE 1 TABLET BY MOUTH EVERY DAY   amoxicillin (AMOXIL) 500 MG capsule 4 capsules by mouth 1 hour before dental procedure.   apixaban (ELIQUIS) 2.5 MG TABS tablet Take 1 tablet (2.5 mg total) by mouth 2 (two) times daily.   Budeson-Glycopyrrol-Formoterol (BREZTRI AEROSPHERE) 160-9-4.8 MCG/ACT AERO Inhale 2 puffs into the lungs 2 (two) times daily as needed ("for flares").   calcium-vitamin D (OSCAL WITH D) 500-5 MG-MCG tablet Take 2 tablets by mouth 2 (two) times daily.   cetirizine HCl (ZYRTEC) 5 MG/5ML SOLN Take 1 mL by mouth 2 (two) times daily as needed for rhinitis or allergies.   Cholecalciferol (VITAMIN D3) 25 MCG (1000 UT) CAPS Take 1,000 Units by mouth daily with lunch.   fluticasone (FLONASE) 50 MCG/ACT nasal spray PLACE 1 SPRAY INTO BOTH NOSTRILS 2 (TWO) TIMES DAILY   furosemide (LASIX) 40 MG tablet Take 1 tablet (40 mg total) by mouth daily.   Glycerin-Hypromellose-PEG 400 (DRY EYE RELIEF DROPS OP) Place 1 drop into both eyes daily as needed (for dry eye).   hydroxychloroquine (PLAQUENIL) 200 MG tablet TAKE 1 TABLET BY MOUTH EVERY DAY   levETIRAcetam (KEPPRA) 500 MG tablet Take 1 tablet (500 mg total) by mouth 2 (two) times daily.   magnesium oxide (MAG-OX) 400 (240 Mg) MG tablet TAKE 1 TABLET BY MOUTH EVERY DAY   OLANZapine (ZYPREXA) 2.5 MG tablet TAKE 1 TABLET BY MOUTH EVERYDAY AT BEDTIME   pantoprazole (PROTONIX) 40 MG tablet Take 1 tablet (40 mg total) by mouth 2 (two) times daily.   polyethylene  glycol powder (MIRALAX) 17 GM/SCOOP powder Take 17 g by mouth daily as needed for mild constipation.   potassium chloride (KLOR-CON M10) 10  MEQ tablet TAKE 2 TABLETS BY MOUTH DAILY   predniSONE (DELTASONE) 5 MG tablet Take 1 tablet (5 mg total) by mouth daily with breakfast. Patient to double up dose during sick day rule   [DISCONTINUED] Nintedanib (OFEV) 150 MG CAPS Take 150 mg by mouth at bedtime.   Nintedanib (OFEV) 150 MG CAPS Take 1 capsule (150 mg total) by mouth at bedtime.   No facility-administered encounter medications on file as of 12/13/2022.     Review of Systems  Review of Systems  N/a Physical Exam  BP (!) 148/84 (BP Location: Right Arm, Patient Position: Sitting, Cuff Size: Normal)   Pulse 89   Temp 97.7 F (36.5 C) (Oral)   Ht 5\' 3"  (1.6 m)   Wt 119 lb (54 kg)   SpO2 94%   BMI 21.08 kg/m   Wt Readings from Last 5 Encounters:  12/13/22 119 lb (54 kg)  12/08/22 120 lb (54.4 kg)  12/07/22 122 lb (55.3 kg)  11/30/22 120 lb (54.4 kg)  11/11/22 128 lb 15.5 oz (58.5 kg)    BMI Readings from Last 5 Encounters:  12/13/22 21.08 kg/m  12/08/22 21.26 kg/m  12/07/22 21.61 kg/m  11/30/22 21.26 kg/m  11/11/22 22.85 kg/m     Physical Exam General: Frail, sitting in chair Eyes: EOMI, no icterus Neck: Supple, no JVP Cardiovascular: Significant 3/6 late systolic murmur heard best right upper sternal border, regular rate Pulmonary: Crackles in bilateral bases, otherwise clear to auscultation bilaterally Abdomen: Nondistended, bowel sounds present MSK: No synovitis, joint effusion Neuro: Ambulates with assistance of cane, otherwise no weakness Psych: Normal mood, full affect   Assessment & Plan:   ILD: 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  To continue this.  High-resolution CT scan ordered given mild worsening of symptoms.  LFTs stable.  Requiring intensive drug monitoring for Ofev.  Dyspnea exertion: Related to ILD, pulmonary  hypertension, asthma.  On therapy for ILD as above.  Mild improvement with escalation of inhaler therapy to [facility].  Referral to pulmonary rehab today.  Asthma: Increase DOE, wheeze, atopic symptoms summer 2022.  Transition Trelegy to [facility] with improved symptoms.  Eosinophils not elevated.  Consider checking IgE and consider escalation to Biologics in the future.  She has not had much wheezing, lungs are clear, do not suspect asthma as primary driver of her dyspnea, most likely ILD.  Pulmonary hypertension: Presumed based on prior TTE.  Never on therapy.  Right heart cath 11/2020 with mild pulmonary hypertension a PVR of 2, normal wedge.  Reportedly significant valvular disease, like combination  of group 2 and group 3.  She is poor candidate for therapy based on her age, frailty, etiology of disease.    Return in about 3 months (around 03/15/2023) for f/u [name].   Karren Burly, MD 12/13/2022

## 2022-12-13 NOTE — Patient Instructions (Signed)
Nice to see you again  Continue Breztri 2 puffs in the morning 2 puffs in the evening  Continue Ofev 1 tablet twice a day, this was refilled today  I sent a referral to pulmonary rehab, this is an exercise program to work on building up your cardiovascular endurance and exercise stamina  I ordered a high-resolution CT scan of the chest to keep an eye on the scarring or fibrosis in the lung.  Return to clinic in 3 months or sooner as needed with [name]

## 2022-12-14 ENCOUNTER — Ambulatory Visit: Payer: Medicare HMO | Admitting: Neurology

## 2022-12-14 ENCOUNTER — Encounter (HOSPITAL_COMMUNITY): Payer: Self-pay

## 2022-12-14 ENCOUNTER — Telehealth (HOSPITAL_COMMUNITY): Payer: Self-pay

## 2022-12-14 NOTE — Telephone Encounter (Signed)
Attempted to call patient in regards to Pulmonary Rehab - LM on VM    Mailed letter and sent my chart message

## 2022-12-14 NOTE — Telephone Encounter (Signed)
Office referral recv'ed, printed and given to RN for review. 

## 2022-12-15 ENCOUNTER — Telehealth (HOSPITAL_COMMUNITY): Payer: Self-pay

## 2022-12-15 NOTE — Telephone Encounter (Signed)
Pt returned PR phone call and stated she is interested in PR. Adv pt once RN hears back back from doctor, we will give her a call to schedule.

## 2022-12-17 ENCOUNTER — Other Ambulatory Visit: Payer: Self-pay | Admitting: Cardiology

## 2022-12-17 DIAGNOSIS — I4821 Permanent atrial fibrillation: Secondary | ICD-10-CM

## 2022-12-17 MED ORDER — APIXABAN 2.5 MG PO TABS: 2.5000 mg | ORAL_TABLET | Freq: Two times a day (BID) | ORAL | 1 refills | Status: DC

## 2022-12-17 NOTE — Telephone Encounter (Signed)
Reached out to pt and spoke with daughter Marcelino Duster, obtained some background information regarding pt's treatment history with Ofev. She has previously been filling with [facility] and has been sustained on grant through [facility], however despite this Marcelino Duster stated that recent copays have been as high as $300.  Discussed changes to Medicare going into effect in 2025 and advised that pt would likely benefit greatly from enrolling into the Medicare Prescription Savings plan due to her concurrent therapies with high-dollar medications such as Ofev, Breztri, and Eliquis. Informed them of the $125 monthly premium should they choose to enroll prior to the beginning of the year and discussed that this would be a more sustainable long-term solution rather than seeking to obtain assistance through the various PAP programs for her medications, however I stated that we would be willing to proceed in whichever manner they would prefer.  Marcelino Duster stated that they would discuss the financial feasibility of the additional monthly premiums through the prescriptions savings program and would call me back when a decision had been reached. Provided my direct phone number and requested she leave a message if I am unavailable. She verbalized understanding to all, will await f/u.

## 2022-12-17 NOTE — Telephone Encounter (Signed)
*  STAT* If patient is at the pharmacy, call can be transferred to refill team.   1. Which medications need to be refilled? (please list name of each medication and dose if known) apixaban (ELIQUIS) 2.5 MG TABS tablet (Expired)   2. Which pharmacy/location (including street and city if local pharmacy) is medication to be sent to?  [id number] - [place], [place] - [address]    3. Do they need a 30 day or 90 day supply? 90

## 2022-12-17 NOTE — Telephone Encounter (Signed)
Routed RX to [facility]

## 2022-12-17 NOTE — Telephone Encounter (Signed)
Eliquis 2.5mg  refill request received. Patient is 77 years old, weight-54kg, Crea-1.15 on 12/07/22, Diagnosis-Afib, and last seen by Canary Brim, NP 12/07/22.   Per Hospital admission 10/2022: Xarelto 15mg  changed to Eliquis due to second occurrence of GIB 10/2022  +Paroxysmal A-fib; Was on Xarelto for A fib. Out patient.  Plan to change to Eliquis low dose starting on Monday.  Cardiology recommend holding NOAC for 5 days. Risk benefit was discussed with patient.   Per Dr. Christeen Douglas note on 11/11/22, it states  Risk profile of eliquis shows less GI bleeding Would hold DOAC for 5 days ( did this in December with no neurologic issues ) then start eliquis 2.5 mg BID for age, body weight and renal function. Spoke with PharmD and advised to continue per last hospital note due to bleeding risk.

## 2022-12-22 NOTE — Progress Notes (Signed)
Office Visit Note  Patient: Lindsey Huber             Date of Birth: November 13, 1945           MRN: [id number]             PCP: Collene Mares, PA Referring: Collene Mares, [name] Visit Date: 01/05/2023   Subjective:  Follow-up   History of Present Illness:   Discussed the use of AI scribe software for clinical note transcription with the patient, who gave verbal consent to proceed.  History of Present Illness   Lindsey Huber is a 77 y.o. female here for follow up for SLE with related ILD, OA, and gouty arthritis on hydroxychloroquine 200 mg daily and prednisone 5 mg daily and allopurinol 100 mg daily.  She had multiple events since our last visit including new seizures initially thought to be a transient ischemic attack (TIA). They also experienced a gastrointestinal bleed. The patient has been on Plaquenil and low-dose prednisone for joint inflammation and lung issues and adrenal insufficiency.  The patient reported morning stiffness, primarily in the knees and occasionally in the legs, with some days involving the entire body. The stiffness typically resolves quickly. They noted swelling in the left ankle, without associated pain. The patient's fingers turn blue and experience a tingling sensation and mild pain, particularly when cold. The color normalization process is slow, but no skin peeling, blistering, or scabs have been observed post-cold exposure.  The patient's breathing has improved, although they experienced shortness of breath during physical exertion, which was associated with elevated blood pressure. The patient also reported a crusty nose with occasional blood specks when blowing their nose, but no full nosebleeds.  Patient is here today with her daughter.  Previous HPI 05/03/2022 Lindsey Huber is a 77 y.o. female here for follow up for  SLE with related ILD, OA, and gouty arthritis on hydroxychloroquine 200 mg daily and prednisone 5 mg daily and allopurinol  100 mg daily.  She had follow-up earlier today with [name] managing with Durel Salts currently tolerating the medication well.  Still having some joint pains frequently bothers her at the ankle and feet not seeing a lot of swelling in upper extremities.  Still gets Raynaud's symptoms without any associated lesions.     Previous HPI 10/26/21 Lindsey Huber is a 77 y.o. female here for follow up for SLE, OA, and gouty arthritis on HCQ 200 mg PO daily and prednisone 5 mg daily and allopurinol 100 mg daily. She has not suffered any flare up of joint pain and swelling since our last visit. No new falls. She has morning stiffness increases somewhat with colder temperatures but keeps home pretty warm. Fingertips turn blue sometimes with cold but no pallor, redness, no skin peeling or lesions.   Previous HPI 07/16/2021 Lindsey Huber is a 77 y.o. female here for follow up for SLE, OA, and gouty arthritis on HCQ 200 mg daily and prednisone 5 mg and allopurinol 100 mg daily. No major flare up with lupus symptoms no joint effusion. She has persistent mild pedal edema. She fell again in May struck her right temple area on she side of the car and also chest and shoulder pain. No major complications identified. She has not yet started with PT for her weakness, pain, and gait difficulty.    Previous HPI 04/09/21 Lindsey Huber is a 77 y.o. female here for follow up for systemic lupus and gout on HCQ 200 mg daily and allopurinol  100 mg daily. She takes prednisone 5 mg daily as well for adrenal insufficiency. She has had multiple falls since last visit most recently about a week ago. This is most often from her leg typically right knee suddenly giving way without preceding signs or symptoms. She fell onto her right hip. Also fell recently landing on left elbow with residual bruising around that area. No new gout attacks or joint swelling besides from the bruising.   Previous HPI 01/05/21 Lindsey Huber is a 77  y.o. female here for follow up for SLE with inflammatory arthritis and gout on hydroxychloroquine 200 mg daily and allopurinol 100 mg. 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  Since our last visit she had a significant event with hospitalization for hematochezia work-up identified a diverticular bleed.  She did have sufficient anemia required blood transfusion otherwise no major complications.  She has had no major change in pulmonary symptoms no new skin rashes or swelling.  Does have mild pedal edema.  Currently she is experiencing some increased knee pain and stiffness.   Previous HPI: 06/27/20 Lindsey Huber is a 77 y.o. female here to establish care for systemic lupus and gouty arthritis currently on hydroxychloroquine 200 mg p.o. daily and allopurinol 100 mg p.o. daily.  She was originally diagnosed due to symptoms of multiple joint pains with orthopedic surgery evaluation in [place] with additional work-up revealing for systemic lupus.  She has previously taken steroids and was on low-dose prednisone 5 mg daily for quite some time although discontinued at her most recent hospitalization.  She has had episodic gout flares involving both feet.  Lupus symptoms have been predominantly arthritis possibly a overlap with rheumatoid arthritis with no known history of lupus nephritis.  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  She has also had intra-articular steroid injection of the knees for arthritis symptoms with reasonably good benefit.  Her left knee has had some frequent slipping or instability reported she describes a fall last week despite routine use of a walker and sometimes knee brace for this.   Review of Systems  Constitutional:  Positive for fatigue.  HENT:  Positive for mouth dryness. Negative for mouth sores.   Eyes:  Positive for dryness.  Respiratory:  Positive for shortness of breath.    Cardiovascular:  Negative for chest pain and palpitations.  Gastrointestinal:  Positive for constipation. Negative for blood in stool and diarrhea.  Endocrine: Positive for increased urination.  Genitourinary:  Positive for involuntary urination.  Musculoskeletal:  Positive for joint pain, gait problem, joint pain, joint swelling, myalgias, muscle weakness, morning stiffness, muscle tenderness and myalgias.  Skin:  Positive for color change. Negative for rash, hair loss and sensitivity to sunlight.  Allergic/Immunologic: Negative for susceptible to infections.  Neurological:  Positive for headaches. Negative for dizziness.  Hematological:  Negative for swollen glands.  Psychiatric/Behavioral:  Positive for sleep disturbance. Negative for depressed mood. The patient is not nervous/anxious.     PMFS History:  Patient Active Problem List   Diagnosis Date Noted   Chronic a-fib (HCC) 11/10/2022   Anticoagulation management encounter 11/10/2022   S/P AVR 11/10/2022   S/P TVR (tricuspid valve repair) 11/10/2022   S/P mitral valve repair 11/10/2022   Acute lower GI bleeding 11/02/2022   Seizure (HCC) 10/26/2022   ABLA (acute blood loss anemia) 07/04/2022   GIB (gastrointestinal bleeding) 07/02/2022   Hemorrhagic shock (HCC) 07/02/2022  Symptomatic anemia 12/26/2021   Mild dementia 08/12/2021   Allergic rhinitis    Chronic gouty arthritis    Epistaxis    Hypoglycemia    Iron deficiency anemia    Long term (current) use of anticoagulants    Malnutrition of mild degree [name]: 75% to less than 90% of standard weight)    Oropharyngeal dysphagia    Osteopenia of neck of left femur    Raynaud's disease    Rheumatoid arthritis    Transient ischemic attack    Slow transit constipation    Thrombophilia    Achalasia    Delusional thoughts    Recurrent falls 04/09/2021   Hypotension 12/14/2020   Hypokalemia 12/14/2020   Hypocalcemia 12/14/2020   Hypomagnesemia 12/14/2020   Non-toxic  multinodular goiter 08/20/2020   Acquired hypothyroidism 08/20/2020   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   Vitamin D deficiency 05/01/2020   Proteinuria 05/01/2020   Osteoarthritis of knee 05/01/2020   Osteoarthritis of hip 05/01/2020   Gastroesophageal reflux disease 05/01/2020   Idiopathic pulmonary fibrosis 05/01/2020   Cardiomyopathy 05/01/2020   Hypercoagulability due to atrial fibrillation 05/01/2020   Systemic lupus erythematosus 05/01/2020   Chronic heart failure with preserved ejection fraction 05/01/2020   Chronic kidney disease (CKD), active medical management without dialysis, stage 3 (moderate) 05/01/2020   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   Non-rheumatic atrial fibrillation 05/01/2020   Atherosclerosis of abdominal aorta 05/01/2020   Diverticulosis of colon 05/01/2020   Cholelithiasis without obstruction 05/01/2020   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   AICD (automatic cardioverter/defibrillator) present 11/14/2019   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 11/14/2019   Mitral and aortic incompetence 11/14/2019   Hyperlipidemia 11/14/2019   Pain of left hip joint 12/12/2017   Essential (primary) hypertension 08/18/2017   CHB (complete heart block) 08/18/2017   NSVT (nonsustained ventricular tachycardia) 08/18/2017   Aortic valve disorder 03/26/2002    Past Medical History:  Diagnosis Date   Achalasia    Acquired hypothyroidism 08/20/2020   Acute metabolic encephalopathy 07/25/2021   AICD (automatic cardioverter/defibrillator) present 11/14/2019   Allergic rhinitis    Ankle fracture 03/25/2022   Aortic valve disorder 03/26/2002   Atherosclerosis of abdominal aorta (HCC) 05/01/2020   Cardiomyopathy (HCC)    CHB (complete heart block) (HCC) 08/18/2017   CHF (congestive heart failure) (HCC)    Cholelithiasis without obstruction 05/01/2020   Chronic gouty arthritis    Chronic heart failure  with preserved ejection fraction (HCC) 05/01/2020   Chronic kidney disease (CKD), active medical management without dialysis, stage 3 (moderate) (HCC) 05/01/2020   Closed torus fracture of distal end of right radius with delayed healing 08/12/2021   Delusional thoughts (HCC)    Diverticulosis of colon 05/01/2020   Epistaxis    Essential (primary) hypertension 08/18/2017   Gastroesophageal reflux disease 05/01/2020   Gastrointestinal hemorrhage    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 11/14/2019   Hypercoagulability due to atrial fibrillation (HCC) 05/01/2020   Hyperlipidemia 11/14/2019   Hypocalcemia 12/14/2020   Hypoglycemia    Hypokalemia 12/14/2020   Hypomagnesemia 12/14/2020   Hypotension 12/14/2020   Idiopathic pulmonary fibrosis (HCC) 05/01/2020   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   Iron deficiency anemia    Long term (current) use of anticoagulants    Malnutrition of mild degree [name]: 75% to less than 90% of standard weight) (HCC)    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/12/2021   Mitral and aortic incompetence 11/14/2019   Non-rheumatic atrial fibrillation (HCC) 05/01/2020  Non-toxic multinodular goiter 08/20/2020   NSVT (nonsustained ventricular tachycardia) (HCC) 08/18/2017   Oropharyngeal dysphagia    Osteoarthritis of hip 05/01/2020   Osteoarthritis of knee 05/01/2020   Osteopenia of neck of left femur    Pain of left hip joint 12/12/2017   Proteinuria 05/01/2020   Raynaud's disease    Recurrent falls 04/09/2021   Rheumatoid arthritis (HCC)    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   Seizures (HCC)    Septic shock (HCC) 03/10/2020   Slow transit constipation    Systemic lupus erythematosus (HCC) 05/01/2020   Thrombophilia (HCC)    Transient ischemic attack    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   Vitamin D deficiency     Family History  Problem Relation Age of Onset   Heart disease  Mother    Hypertension Mother    Rheum arthritis Mother    Osteoarthritis Mother    Heart disease Father    Hypertension Father    Osteoarthritis Father    Heart disease Sister    Diabetes Brother    Cancer Brother    Past Surgical History:  Procedure Laterality Date   BREAST BIOPSY Left    CARDIAC VALVE SURGERY     CARPAL TUNNEL RELEASE     COLONOSCOPY WITH PROPOFOL N/A 12/20/2020   Procedure: COLONOSCOPY WITH PROPOFOL;  Surgeon: Willis Modena, MD;  Location[facility] ENDOSCOPY;  Service: Endoscopy;  Laterality: N/A;   HEMORRHOID SURGERY     PACEMAKER INSERTION     RIGHT/LEFT HEART CATH AND CORONARY ANGIOGRAPHY N/A 11/27/2020   Procedure: RIGHT/LEFT HEART CATH AND CORONARY ANGIOGRAPHY;  Surgeon: Dolores Patty, MD;  Location: [facility] INVASIVE CV LAB;  Service: Cardiovascular;  Laterality: N/A;   TONSILLECTOMY     Social History   Social History Narrative   Right Handed    Immunization History  Administered Date(s) Administered   Fluad Trivalent(High Dose 65+) 11/03/2022   Influenza-Unspecified 10/26/2019, 10/07/2021   PFIZER(Purple Top)SARS-COV-2 Vaccination 04/17/2019, 05/08/2019, 11/24/2019     Objective: Vital Signs: BP 119/71 (BP Location: Left Arm, Patient Position: Sitting, Cuff Size: Normal)   Pulse 73   Resp 16   Ht 5\' 3"  (1.6 m)   Wt 120 lb (54.4 kg)   BMI 21.26 kg/m    Physical Exam Eyes:     Conjunctiva/sclera: Conjunctivae normal.  Cardiovascular:     Rate and Rhythm: Normal rate and regular rhythm.  Pulmonary:     Effort: Pulmonary effort is normal.     Breath sounds: Normal breath sounds.  Lymphadenopathy:     Cervical: No cervical adenopathy.  Skin:    General: Skin is warm and dry.     Comments: No digital pitting  Neurological:     Mental Status: She is alert.  Psychiatric:        Mood and Affect: Mood normal.      Musculoskeletal Exam:  Shoulders full ROM no tenderness or swelling Elbows full ROM no tenderness or swelling Mild  restriction in wrist and finger joint range of motion with some crepitus, no focal tenderness or palpable effusion, first CMC joint squaring with MCP hyperextension bilaterally Knees full ROM no tenderness or swelling, bilateral patellofemoral crepitus Ankles full ROM no tenderness or swelling  Investigation: No additional findings.  Imaging: US THYROID  Result Date: 12/16/2022 CLINICAL DATA:  Prior ultrasound follow-up. EXAM: THYROID ULTRASOUND TECHNIQUE: Ultrasound examination of the thyroid gland and adjacent soft tissues was performed. COMPARISON:  12/10/2021 FINDINGS: Parenchymal Echotexture: Markedly  heterogeneous Isthmus: 0.2 cm ,previously 0.3 cm Right lobe: 4.5 x 2.0 x 1.8 cm ,previously 4.6 x 2.0 x 1.8 cm Left lobe: 6.0 x 3.3 x 2.1 cm ,previously 6.4 x 2.6 x 3.4 cm ________________________________________________________ Estimated total number of nodules >/= 1 cm: 2 Number of spongiform nodules >/=  2 cm not described below (TR1): 0 Number of mixed cystic and solid nodules >/= 1.5 cm not described below (TR2): 0 _________________________________________________________ Similar benign appearing pseudo nodularity in the right mid thyroid (labeled 1, 2.7 cm, previously 2.2 cm). Similar benign appearing spongiform nodule in the right mid thyroid (labeled 2, 0.7 cm, previously 0.8 cm). Similar appearance of previously biopsied left mid solid thyroid nodule (labeled 3, 1.8 cm, previously 1.7 cm). No cervical lymphadenopathy. IMPRESSION: Similar appearance of previously biopsied left mid solid thyroid nodule (labeled 3, 1.8 cm, previously 1.7 cm). Recommend correlation with prior biopsy results. The above is in keeping with the ACR TI-RADS recommendations - J Am Coll Radiol 2017;14:587-595. Marliss Coots, MD [facility] Electronically Signed   By: Marliss Coots M.D.   On: 12/16/2022 16:56   CUP PACEART INCLINIC DEVICE CHECK  Result Date:  12/07/2022 ICD check in clinic. Normal device function. Thresholds and sensing consistent with previous device measurements. No R waves at 40 from prior visit, none today at 45. Impedance trends stable over time. Permanent AF. No ventricular arrhythmias. Histogram distribution appropriate for patient and level of activity.  Device is programmed at appropriate safety margins. Device is programmed to optimize intrinsic conduction. Estimated longevity 3.5 years. Pt enrolled in remote follow-up. Patient education completed. No changes made this session.   Recent Labs: Lab Results  Component Value Date   WBC 6.2 12/07/2022   HGB 10.4 (L) 12/07/2022   PLT 167 12/07/2022   NA 144 12/07/2022   K 4.0 12/07/2022   CL 103 12/07/2022   CO2 24 12/07/2022   GLUCOSE 70 12/07/2022   BUN 25 12/07/2022   CREATININE 1.15 (H) 12/07/2022   BILITOT 0.4 11/10/2022   ALKPHOS 67 11/10/2022   AST 28 11/10/2022   ALT 16 11/10/2022   PROT 6.1 (L) 11/10/2022   ALBUMIN 2.4 (L) 11/10/2022   CALCIUM 10.0 12/07/2022    Speciality Comments: PLQ Eye Exam-11/19/2022 [facility]  Procedures:  No procedures performed Allergies: Other, Propofol, Flecainide, Amiodarone, and Amlodipine   Assessment / Plan:     Visit Diagnoses: Systemic lupus erythematosus, unspecified SLE type, unspecified organ involvement status (HCC)  High risk medication use - hydroxychloroquine 200 mg daily. PLQ Eye Exam-11/19/2022 WNL  Systemic Lupus Erythematosus (SLE) No current flare-up. Morning stiffness in knees and legs, occasional whole body stiffness. Left ankle swelling without pain. Fingers turn blue and tingle when cold, no skin peeling or blistering. -Continue Plaquenil 200 mg daily -Continuing prednisone 5 mg and for secondary AI  Raynaud's Phenomenon Fingers turn blue and tingle when cold, no skin peeling or blistering. -Continue current management just cold avoidance, no medication  changes  Osteoarthritis Pain and stiffness in knees, left ankle swelling.  Discussed possibility of topical medications or local injection but symptoms manageable without any new flareup or exacerbation.  Shortness of Breath Improved, occasional episodes when exerting.  Ongoing follow-up with pulmonology and on Ofev for fibrosis.  Is looking into pulmonary rehab.   Orders: No orders of the defined types were placed in this encounter.  Meds ordered this encounter  Medications   hydroxychloroquine (PLAQUENIL) 200 MG tablet  Sig: Take 1 tablet (200 mg total) by mouth daily.    Dispense:  90 tablet    Refill:  1   allopurinol (ZYLOPRIM) 100 MG tablet    Sig: Take 1 tablet (100 mg total) by mouth daily.    Dispense:  90 tablet    Refill:  1     Follow-Up Instructions: Return in about 6 months (around 07/06/2023) for SLE/gout on HCQ/GC/allopurinol f/u 6mos.   Fuller Plan, MD  Note - This record has been created using [employer].  Chart creation errors have been sought, but may not always  have been located. Such creation errors do not reflect on  the standard of medical care.

## 2022-12-25 ENCOUNTER — Other Ambulatory Visit: Payer: Self-pay | Admitting: Internal Medicine

## 2022-12-25 DIAGNOSIS — M329 Systemic lupus erythematosus, unspecified: Secondary | ICD-10-CM

## 2022-12-27 NOTE — Telephone Encounter (Signed)
Last Fill: 09/21/2022  Eye exam: 11/19/2022 BMP Creatinine 1.15, eGFR 50, RBC 3.34, hemoglobin 10.4, Hematocrit 33.3, MCV 100, MCHC 31.2,   Labs: 12/07/2022   Next Visit: 01/05/2023   Last Visit: 05/03/2022  [id number] lupus erythematosus, unspecified SLE type, unspecified organ involvement status   Current Dose per office note 05/03/2022: hydroxychloroquine 200 mg daily   Okay to refill Plaquenil?

## 2022-12-28 ENCOUNTER — Other Ambulatory Visit: Payer: Medicare HMO

## 2022-12-28 ENCOUNTER — Other Ambulatory Visit: Payer: Self-pay | Admitting: Internal Medicine

## 2023-01-04 ENCOUNTER — Telehealth (HOSPITAL_COMMUNITY): Payer: Self-pay

## 2023-01-04 ENCOUNTER — Encounter (HOSPITAL_COMMUNITY): Payer: Self-pay

## 2023-01-04 NOTE — Telephone Encounter (Signed)
Attempted to call patient in regards to Pulmonary Rehab - LM on VM    Mailed 2nd letter

## 2023-01-05 ENCOUNTER — Ambulatory Visit: Payer: Medicare HMO | Attending: Internal Medicine | Admitting: Internal Medicine

## 2023-01-05 ENCOUNTER — Encounter: Payer: Self-pay | Admitting: Internal Medicine

## 2023-01-05 ENCOUNTER — Inpatient Hospital Stay
Admission: RE | Admit: 2023-01-05 | Discharge: 2023-01-05 | Disposition: A | Discharge status: Home / Self Care | Payer: Medicare HMO | Source: Ambulatory Visit | Attending: Pulmonary Disease | Admitting: Pulmonary Disease

## 2023-01-05 VITALS — BP 119/71 | HR 73 | Resp 16 | Ht 63.0 in | Wt 120.0 lb

## 2023-01-05 DIAGNOSIS — Z5181 Encounter for therapeutic drug level monitoring: Secondary | ICD-10-CM | POA: Diagnosis not present

## 2023-01-05 DIAGNOSIS — M1A00X0 Idiopathic chronic gout, unspecified site, without tophus (tophi): Secondary | ICD-10-CM

## 2023-01-05 DIAGNOSIS — J849 Interstitial pulmonary disease, unspecified: Secondary | ICD-10-CM

## 2023-01-05 DIAGNOSIS — M329 Systemic lupus erythematosus, unspecified: Secondary | ICD-10-CM

## 2023-01-05 DIAGNOSIS — M1A9XX0 Chronic gout, unspecified, without tophus (tophi): Secondary | ICD-10-CM

## 2023-01-05 DIAGNOSIS — Z7952 Long term (current) use of systemic steroids: Secondary | ICD-10-CM | POA: Diagnosis not present

## 2023-01-05 DIAGNOSIS — J84112 Idiopathic pulmonary fibrosis: Secondary | ICD-10-CM

## 2023-01-05 DIAGNOSIS — M1A09X0 Idiopathic chronic gout, multiple sites, without tophus (tophi): Secondary | ICD-10-CM

## 2023-01-05 DIAGNOSIS — Z79899 Other long term (current) drug therapy: Secondary | ICD-10-CM | POA: Diagnosis not present

## 2023-01-05 DIAGNOSIS — M069 Rheumatoid arthritis, unspecified: Secondary | ICD-10-CM

## 2023-01-05 DIAGNOSIS — I7300 Raynaud's syndrome without gangrene: Secondary | ICD-10-CM

## 2023-01-05 MED ORDER — ALLOPURINOL 100 MG PO TABS: 100.0000 mg | ORAL_TABLET | Freq: Every day | ORAL | 1 refills | Status: DC

## 2023-01-05 MED ORDER — HYDROXYCHLOROQUINE SULFATE 200 MG PO TABS: 200.0000 mg | ORAL_TABLET | Freq: Every day | ORAL | 1 refills | Status: DC

## 2023-01-08 ENCOUNTER — Other Ambulatory Visit: Payer: Self-pay

## 2023-01-08 ENCOUNTER — Encounter (HOSPITAL_BASED_OUTPATIENT_CLINIC_OR_DEPARTMENT_OTHER): Payer: Self-pay | Admitting: Urology

## 2023-01-08 ENCOUNTER — Emergency Department (HOSPITAL_BASED_OUTPATIENT_CLINIC_OR_DEPARTMENT_OTHER)
Admission: EM | Admit: 2023-01-08 | Discharge: 2023-01-08 | Disposition: A | Discharge status: Home / Self Care | Payer: Medicare HMO | Attending: Emergency Medicine | Admitting: Emergency Medicine

## 2023-01-08 ENCOUNTER — Emergency Department (HOSPITAL_BASED_OUTPATIENT_CLINIC_OR_DEPARTMENT_OTHER): Payer: Medicare HMO

## 2023-01-08 DIAGNOSIS — W01198D Fall on same level from slipping, tripping and stumbling with subsequent striking against other object, subsequent encounter: Secondary | ICD-10-CM | POA: Diagnosis not present

## 2023-01-08 DIAGNOSIS — S00212D Abrasion of left eyelid and periocular area, subsequent encounter: Secondary | ICD-10-CM | POA: Insufficient documentation

## 2023-01-08 DIAGNOSIS — Z7901 Long term (current) use of anticoagulants: Secondary | ICD-10-CM | POA: Diagnosis not present

## 2023-01-08 DIAGNOSIS — S42012K Anterior displaced fracture of sternal end of left clavicle, subsequent encounter for fracture with nonunion: Secondary | ICD-10-CM | POA: Diagnosis not present

## 2023-01-08 DIAGNOSIS — S0990XA Unspecified injury of head, initial encounter: Secondary | ICD-10-CM | POA: Diagnosis not present

## 2023-01-08 DIAGNOSIS — W19XXXA Unspecified fall, initial encounter: Secondary | ICD-10-CM

## 2023-01-08 DIAGNOSIS — S4992XD Unspecified injury of left shoulder and upper arm, subsequent encounter: Secondary | ICD-10-CM | POA: Diagnosis present

## 2023-01-08 MED ORDER — ACETAMINOPHEN 500 MG PO TABS
1000.0000 mg | ORAL_TABLET | Freq: Once | ORAL | Status: AC
  Administered 2023-01-08: 1000 mg via ORAL
  Filled 2023-01-08: qty 2

## 2023-01-08 MED ORDER — OXYCODONE HCL 5 MG PO TABS
2.5000 mg | ORAL_TABLET | Freq: Once | ORAL | Status: AC
  Administered 2023-01-08: 2.5 mg via ORAL
  Filled 2023-01-08: qty 1

## 2023-01-08 MED ORDER — OXYCODONE HCL 5 MG PO TABS: 2.5000 mg | ORAL_TABLET | Freq: Four times a day (QID) | ORAL | 0 refills | Status: DC | PRN

## 2023-01-08 NOTE — ED Provider Notes (Signed)
[facility] Provider Note   CSN: [id number] Arrival date & time: 01/08/23  1919     History Chief Complaint  Patient presents with   Fall    HPI Lindsey Huber is a 77 y.o. female presenting for ground-level fall.  Lost her balance transitioning from her wheelchair to a chair. Fell to the left side hitting her left shoulder and right knee on the ground as well as her head.  Denies fevers chills nausea vomiting syncope shortness of breath.  On Eliquis.  Patient's recorded medical, surgical, social, medication list and allergies were reviewed in the Snapshot window as part of the initial history.   Review of Systems   Review of Systems  Constitutional:  Negative for chills and fever.  HENT:  Negative for ear pain and sore throat.   Eyes:  Negative for pain and visual disturbance.  Respiratory:  Negative for cough and shortness of breath.   Cardiovascular:  Negative for chest pain and palpitations.  Gastrointestinal:  Negative for abdominal pain and vomiting.  Genitourinary:  Negative for dysuria and hematuria.  Musculoskeletal:  Negative for arthralgias and back pain.  Skin:  Negative for color change and rash.  Neurological:  Negative for seizures and syncope.  All other systems reviewed and are negative.   Physical Exam Updated Vital Signs BP 135/60   Pulse 70   Temp 98.4 F (36.9 C) (Oral)   Resp 15   Ht 5\' 3"  (1.6 m)   Wt 54.4 kg   SpO2 99%   BMI 21.24 kg/m  Physical Exam Physical Exam  Neurologic: GCS 15, motor intact in all four extremities, sensory intact in all 4 extremities  Head: Pupils are 3mm, equally round and reactive to light, patient has no obvious facial trauma, no hemotympanum  Neck: patient has no midline neck tenderness, no obvious injuries.  Thorax: Patient has stable clavicles, stable thorax with bilateral chest rise and breath sounds heard.  No penetrating thoracic injury.  CV/Pulm: RRR, no  audible murmer/rubs/gallops, CTAB  Abdomen: Patient has no abdominal distention, no penetrating abdominal injury.  Back: Patient has no midline spinal tenderness in the thoracic and lumbar spine, patient has no paraspinal tenderness bilaterally.  Pelvis: Patient has a stable pelvis to compression with palpable femoral pulses.  Extremities:Patient's upper extremities with no obvious injury or abnormality, radial pulses present. Patient's lower extremities with no obvious injury or abnormality, tibial pulses present.    ED Course/ Medical Decision Making/ A&P    Procedures Procedures   Medications Ordered in ED Medications  acetaminophen (TYLENOL) tablet 1,000 mg (1,000 mg Oral Given 01/08/23 1952)  oxyCODONE (Oxy IR/ROXICODONE) immediate release tablet 2.5 mg (2.5 mg Oral Given 01/08/23 2108)   Medical Decision Making:    Lindsey Huber is a 77 y.o. female who presented to the ED today with a moderate mechanisma trauma, detailed above.    Additional history discussed with patient's family/caregivers.  Patient placed on continuous vitals and telemetry monitoring while in ED which was reviewed periodically.   Given this mechanism of trauma, a full physical exam was performed. Notably, patient was HDS in NAD.  Left shoulder pain, right shin pain.  No visible deformities externally.  Reviewed and confirmed nursing documentation for past medical history, family history, social history.    Initial Assessment/Plan:   This is a patient presenting with a moderate mechanism trauma.  As such, I have considered intracranial injuries including intracranial hemorrhage, intrathoracic injuries including blunt myocardial or  blunt lung injury, blunt abdominal injuries including aortic dissection, bladder injury, spleen injury, liver injury and I have considered orthopedic injuries including extremity or spinal injury. With the patient's presentation of moderate mechanism trauma but an otherwise  reassuring exam, patient warrants targeted evaluation for potential traumatic injuries. Will proceed with targeted evaluation for potential injuries. Will proceed with CT head, CT C-spine, left shoulder x-ray, left humerus x-ray, right tib-fib.. Objective evaluation images reviewed and report resulted with no acute pathology.   Final Reassessment and Plan:   Left clavicle fracture.  Placed in sling.  No other injuries.  Patient feels comfortable with outpatient care management.   Disposition:  I have considered need for hospitalization, however, considering all of the above, I believe this patient is stable for discharge at this time.  Patient/family educated about specific return precautions for given chief complaint and symptoms.  Patient/family educated about follow-up with PCP.     Patient/family expressed understanding of return precautions and need for follow-up. Patient spoken to regarding all imaging and laboratory results and appropriate follow up for these results. All education provided in verbal form with additional information in written form. Time was allowed for answering of patient questions. Patient discharged.    Emergency Department Medication Summary:   Medications  acetaminophen (TYLENOL) tablet 1,000 mg (1,000 mg Oral Given 01/08/23 1952)  oxyCODONE (Oxy IR/ROXICODONE) immediate release tablet 2.5 mg (2.5 mg Oral Given 01/08/23 2108)     Clinical Impression: 1. Fall, initial encounter   2. 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

## 2023-01-08 NOTE — ED Triage Notes (Signed)
Pt to triage in wheelchair  States fell onto left side and hit face and left shoulder  Denies LOC  Small abrasion noted to left eyebrow, no bleeding    Pt on Eliquis

## 2023-01-20 ENCOUNTER — Other Ambulatory Visit: Payer: Self-pay | Admitting: Neurology

## 2023-01-25 ENCOUNTER — Ambulatory Visit: Payer: Medicare HMO | Admitting: Surgical

## 2023-01-25 ENCOUNTER — Telehealth (HOSPITAL_COMMUNITY): Payer: Self-pay

## 2023-01-25 ENCOUNTER — Encounter: Payer: Self-pay | Admitting: Surgical

## 2023-01-25 ENCOUNTER — Other Ambulatory Visit (INDEPENDENT_AMBULATORY_CARE_PROVIDER_SITE_OTHER): Payer: Self-pay

## 2023-01-25 DIAGNOSIS — S4992XA Unspecified injury of left shoulder and upper arm, initial encounter: Secondary | ICD-10-CM

## 2023-01-25 NOTE — Telephone Encounter (Signed)
 No response from in regards to PR.   Closed referral

## 2023-01-25 NOTE — Progress Notes (Signed)
 Office Visit Note   Patient: Lindsey Huber           Date of Birth: 1945/12/19           MRN: [id number] Visit Date: 01/25/2023 Requested by: [name] Gorman BRAVO, PA [address] [place],  [place] [address] PCP: [name], [name], PA  Subjective: Chief Complaint  Patient presents with   Other    Fall on 01/08/23 left shoulder/clavicle pain    HPI: Lindsey Huber is a 77 y.o. female who presents to the office reporting left shoulder pain.  Patient fell and landed on her left arm on 01/08/2023.  She went to the emergency department where she was told she has lateral clavicle fracture.  She has been in sling since the injury.  Has had about 20% improvement in pain.  She is on Eliquis  and has a fair amount of ecchymosis.  Additionally, notes some soreness to the base of her right thumb and her left knee; has history of knee arthritis.  Both of these joint pains are slowly improving as she gets further out from injury as well.  She is still very functional with her right hand though she does have some tingling in the fingertips of her right hand and sometimes she drops objects.  She has not been doing any lifting with the left arm.  Able to sleep okay at night..                ROS: All systems reviewed are negative as they relate to the chief complaint within the history of present illness.  Patient denies fevers or chills.  Assessment & Plan: Visit Diagnoses:  1. Injury of left clavicle, initial encounter     Plan: Patient is a 77 year old female who presents for evaluation of left clavicle fracture.  Has left clavicle fracture again noted on today's radiographs with no significant change in alignment.  No significant concern for skin tenting at this time.  There is a little bit of possible early callus formation noted on today's radiographs but there is still a fair amount of mobility at the fracture site so we will leave her in the sling for couple more weeks and continue  with no lifting to the injured extremity.  She can come out of the sling a couple times throughout the day in order to straighten her elbow out to prevent elbow stiffness.  We discussed the possibility of injection for her knee but she would like to hold off on this for now.  We did provide her with knee brace.  Follow-up in 3 weeks for clinical recheck with new radiographs at that time  Follow-Up Instructions: No follow-ups on file.   Orders:  Orders Placed This Encounter  Procedures   XR Clavicle Left   No orders of the defined types were placed in this encounter.     Procedures: No procedures performed   Clinical Data: No additional findings.  Objective: Vital Signs: There were no vitals taken for this visit.  Physical Exam:  Constitutional: Patient appears well-developed HEENT:  Head: Normocephalic Eyes:EOM are normal Neck: Normal range of motion Cardiovascular: Normal rate Pulmonary/chest: Effort normal Neurologic: Patient is alert Skin: Skin is warm Psychiatric: Patient has normal mood and affect  Ortho Exam: Ortho exam demonstrates left shoulder with fairly nonpainful passive motion.  She has axillary nerve intact with deltoid firing.  There is ecchymosis noted around the left shoulder region with no significant tenting of the skin around  the clavicle fracture site.  She does have some continued mobility at the fracture site and mild to moderate tenderness over the lateral clavicle shaft.  No tenderness over the acromion or scapula.  She has excellent rotator cuff strength of supra, infra, subscap rated 5/5.  Intact EPL, FPL, finger abduction, grip strength testing, bicep, tricep.  2+ radial pulse of the left upper extremity.  No tenderness over the proximal humerus or bicipital groove.  Specialty Comments:  No specialty comments available.  Imaging: No results found.   PMFS History: Patient Active Problem List   Diagnosis Date Noted   Chronic a-fib (HCC)  11/10/2022   Anticoagulation management encounter 11/10/2022   S/P AVR 11/10/2022   S/P TVR (tricuspid valve repair) 11/10/2022   S/P mitral valve repair 11/10/2022   Acute lower GI bleeding 11/02/2022   Seizure (HCC) 10/26/2022   ABLA (acute blood loss anemia) 07/04/2022   GIB (gastrointestinal bleeding) 07/02/2022   Hemorrhagic shock (HCC) 07/02/2022   Symptomatic anemia 12/26/2021   Mild dementia 08/12/2021   Allergic rhinitis    Chronic gouty arthritis    Epistaxis    Hypoglycemia    Iron deficiency anemia    Long term (current) use of anticoagulants    Malnutrition of mild degree [name]: 75% to less than 90% of standard weight)    Oropharyngeal dysphagia    Osteopenia of neck of left femur    Raynaud's disease    Rheumatoid arthritis    Transient ischemic attack    Slow transit constipation    Thrombophilia    Achalasia    Delusional thoughts    Recurrent falls 04/09/2021   Hypotension 12/14/2020   Hypokalemia 12/14/2020   Hypocalcemia 12/14/2020   Hypomagnesemia 12/14/2020   Non-toxic multinodular goiter 08/20/2020   Acquired hypothyroidism 08/20/2020   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   Vitamin D  deficiency 05/01/2020   Proteinuria 05/01/2020   Osteoarthritis of knee 05/01/2020   Osteoarthritis of hip 05/01/2020   Gastroesophageal reflux disease 05/01/2020   Idiopathic pulmonary fibrosis 05/01/2020   Cardiomyopathy 05/01/2020   Hypercoagulability due to atrial fibrillation 05/01/2020   Systemic lupus erythematosus 05/01/2020   Chronic heart failure with preserved ejection fraction 05/01/2020   Chronic kidney disease (CKD), active medical management without dialysis, stage 3 (moderate) 05/01/2020   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   Non-rheumatic atrial fibrillation 05/01/2020   Atherosclerosis of abdominal aorta 05/01/2020   Diverticulosis of colon 05/01/2020   Cholelithiasis without obstruction 05/01/2020   Secondary  adrenal insufficiency 05/01/2020   AICD (automatic cardioverter/defibrillator) present 11/14/2019   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 11/14/2019   Mitral and aortic incompetence 11/14/2019   Hyperlipidemia 11/14/2019   Pain of left hip joint 12/12/2017   Essential (primary) hypertension 08/18/2017   CHB (complete heart block) 08/18/2017   NSVT (nonsustained ventricular tachycardia) 08/18/2017   Aortic valve disorder 03/26/2002   Past Medical History:  Diagnosis Date   Achalasia    Acquired hypothyroidism 08/20/2020   Acute metabolic encephalopathy 07/25/2021   AICD (automatic cardioverter/defibrillator) present 11/14/2019   Allergic rhinitis    Ankle fracture 03/25/2022   Aortic valve disorder 03/26/2002   Atherosclerosis of abdominal aorta (HCC) 05/01/2020   Cardiomyopathy (HCC)    CHB (complete heart block) (HCC) 08/18/2017   CHF (congestive heart failure) (HCC)    Cholelithiasis without obstruction 05/01/2020   Chronic gouty arthritis    Chronic heart failure with preserved ejection fraction (HCC) 05/01/2020   Chronic kidney disease (CKD),  active medical management without dialysis, stage 3 (moderate) (HCC) 05/01/2020   Closed torus fracture of distal end of right radius with delayed healing 08/12/2021   Delusional thoughts (HCC)    Diverticulosis of colon 05/01/2020   Epistaxis    Essential (primary) hypertension 08/18/2017   Gastroesophageal reflux disease 05/01/2020   Gastrointestinal hemorrhage    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 11/14/2019   Hypercoagulability due to atrial fibrillation (HCC) 05/01/2020   Hyperlipidemia 11/14/2019   Hypocalcemia 12/14/2020   Hypoglycemia    Hypokalemia 12/14/2020   Hypomagnesemia 12/14/2020   Hypotension 12/14/2020   Idiopathic pulmonary fibrosis (HCC) 05/01/2020   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   Iron deficiency anemia    Long term (current) use of  anticoagulants    Malnutrition of mild degree [name]: 75% to less than 90% of standard weight) (HCC)    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/12/2021   Mitral and aortic incompetence 11/14/2019   Non-rheumatic atrial fibrillation (HCC) 05/01/2020   Non-toxic multinodular goiter 08/20/2020   NSVT (nonsustained ventricular tachycardia) (HCC) 08/18/2017   Oropharyngeal dysphagia    Osteoarthritis of hip 05/01/2020   Osteoarthritis of knee 05/01/2020   Osteopenia of neck of left femur    Pain of left hip joint 12/12/2017   Proteinuria 05/01/2020   Raynaud's disease    Recurrent falls 04/09/2021   Rheumatoid arthritis (HCC)    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   Seizures (HCC)    Septic shock (HCC) 03/10/2020   Slow transit constipation    Systemic lupus erythematosus (HCC) 05/01/2020   Thrombophilia (HCC)    Transient ischemic attack    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   Vitamin D  deficiency     Family History  Problem Relation Age of Onset   Heart disease Mother    Hypertension Mother    Rheum arthritis Mother    Osteoarthritis Mother    Heart disease Father    Hypertension Father    Osteoarthritis Father    Heart disease Sister    Diabetes Brother    Cancer Brother     Past Surgical History:  Procedure Laterality Date   BREAST BIOPSY Left    CARDIAC VALVE SURGERY     CARPAL TUNNEL RELEASE     COLONOSCOPY WITH PROPOFOL  N/A 12/20/2020   Procedure: COLONOSCOPY WITH PROPOFOL ;  Surgeon: Burnette Fallow, MD;  Location: [facility] ENDOSCOPY;  Service: Endoscopy;  Laterality: N/A;   HEMORRHOID SURGERY     PACEMAKER INSERTION     RIGHT/LEFT HEART CATH AND CORONARY ANGIOGRAPHY N/A 11/27/2020   Procedure: RIGHT/LEFT HEART CATH AND CORONARY ANGIOGRAPHY;  Surgeon: Cherrie Toribio SAUNDERS, MD;  Location: [facility] IN[facility]ASIVE [facility];  Service: Cardiovascular;  Laterality: N/A;   TONSILLECTOMY     Social History   Occupational History   Occupation:  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  Tobacco Use   Smoking status: Never    Passive exposure: Past   Smokeless tobacco: Never  Vaping Use   Vaping status: Never Used  Substance and Sexual Activity   Alcohol  use: Never   Drug use: Never   Sexual activity: Not Currently

## 2023-01-31 ENCOUNTER — Other Ambulatory Visit: Payer: Self-pay

## 2023-01-31 DIAGNOSIS — E039 Hypothyroidism, unspecified: Secondary | ICD-10-CM

## 2023-02-01 ENCOUNTER — Other Ambulatory Visit: Payer: Medicare HMO

## 2023-02-02 LAB — TSH: TSH: 3.29 m[IU]/L (ref 0.40–4.50)

## 2023-02-02 LAB — T4, FREE: Free T4: 1.2 ng/dL (ref 0.8–1.8)

## 2023-02-07 ENCOUNTER — Ambulatory Visit: Payer: Medicare HMO | Admitting: Surgical

## 2023-02-15 ENCOUNTER — Ambulatory Visit (INDEPENDENT_AMBULATORY_CARE_PROVIDER_SITE_OTHER): Payer: Medicare HMO

## 2023-02-15 DIAGNOSIS — I429 Cardiomyopathy, unspecified: Secondary | ICD-10-CM

## 2023-02-15 DIAGNOSIS — I4821 Permanent atrial fibrillation: Secondary | ICD-10-CM | POA: Diagnosis not present

## 2023-02-15 LAB — CUP PACEART REMOTE DEVICE CHECK
Battery Remaining Longevity: 38 mo
Battery Voltage: 2.96 V
Brady Statistic RV Percent Paced: 99.28 %
Date Time Interrogation Session: 20250121012203
HighPow Impedance: 66 Ohm
Implantable Lead Connection Status: 753985
Implantable Lead Implant Date: 20120920
Implantable Lead Location: 753862
Implantable Pulse Generator Implant Date: 20190809
Lead Channel Impedance Value: 304 Ohm
Lead Channel Impedance Value: 361 Ohm
Lead Channel Pacing Threshold Amplitude: 0.625 V
Lead Channel Pacing Threshold Pulse Width: 0.4 ms
Lead Channel Sensing Intrinsic Amplitude: 11.25 mV
Lead Channel Sensing Intrinsic Amplitude: 11.25 mV
Lead Channel Setting Pacing Amplitude: 1.25 V
Lead Channel Setting Pacing Pulse Width: 0.4 ms
Lead Channel Setting Sensing Sensitivity: 0.3 mV
Zone Setting Status: 755011
Zone Setting Status: 755011

## 2023-02-16 ENCOUNTER — Encounter: Payer: Self-pay | Admitting: Cardiology

## 2023-02-21 ENCOUNTER — Ambulatory Visit (INDEPENDENT_AMBULATORY_CARE_PROVIDER_SITE_OTHER): Payer: Medicare HMO | Admitting: Surgical

## 2023-02-21 ENCOUNTER — Other Ambulatory Visit (INDEPENDENT_AMBULATORY_CARE_PROVIDER_SITE_OTHER): Payer: Medicare HMO

## 2023-02-21 ENCOUNTER — Encounter: Payer: Self-pay | Admitting: Surgical

## 2023-02-21 DIAGNOSIS — S4992XA Unspecified injury of left shoulder and upper arm, initial encounter: Secondary | ICD-10-CM

## 2023-02-21 NOTE — Progress Notes (Signed)
Post-fracture Visit Note   Patient: Lindsey Huber           Date of Birth: 03/03/1945           MRN: [id number] Visit Date: 02/21/2023 PCP: Collene Mares, PA   Assessment & Plan:  Chief Complaint:  Chief Complaint  Patient presents with   Other    Follow up left clavicle fx  DOI: 01/08/23   Visit Diagnoses:  1. Injury of left clavicle, initial encounter     Plan: Patient is a 78 year old female who returns for reevaluation of left clavicle fracture sustained on 01/08/2023.  She reports that she is doing better.  She is in the sling and coming out of the sling several times a day to do some pendulum's and some elbow range of motion exercises.  She reports decreased pain.  Takes vitamin D every day.  Not taking any medications for her shoulder pain.  No mechanical clicking in the shoulder.  Has not been doing any lifting.  On exam, patient has palpable radial pulse of the left upper extremity.  Intact EPL, FPL, finger abduction.  Axillary nerve is intact with deltoid firing.  Does have some mild tenderness over the distal clavicle but no motion is discernible at the fracture site.  No ecchymosis noted.  No tenderness over the [facility] joint.  No tenderness over the acromion.  She has range of motion of about 30 degrees X rotation, 105 degrees abduction, 125 degrees forward elevation.  Radiographs taken today demonstrate no change in alignment at the fracture site.  Clinically seems that she is healing the clavicle fracture though she is still somewhat symptomatic.  We will plan to discontinue sling.  No lifting more than the weight of the cell phone.  She is okay for full active range of motion of the shoulder.  Avoid lifting anything overhead or away from her body.  Follow-up in 3 to 4 weeks for clinical recheck.  Follow-Up Instructions: No follow-ups on file.   Orders:  Orders Placed This Encounter  Procedures   XR Clavicle Left   No orders of the defined types were placed in  this encounter.   Imaging: No results found.  PMFS History: Patient Active Problem List   Diagnosis Date Noted   Chronic a-fib (HCC) 11/10/2022   Anticoagulation management encounter 11/10/2022   S/P AVR 11/10/2022   S/P TVR (tricuspid valve repair) 11/10/2022   S/P mitral valve repair 11/10/2022   Acute lower GI bleeding 11/02/2022   Seizure (HCC) 10/26/2022   ABLA (acute blood loss anemia) 07/04/2022   GIB (gastrointestinal bleeding) 07/02/2022   Hemorrhagic shock (HCC) 07/02/2022   Symptomatic anemia 12/26/2021   Mild dementia 08/12/2021   Allergic rhinitis    Chronic gouty arthritis    Epistaxis    Hypoglycemia    Iron deficiency anemia    Long term (current) use of anticoagulants    Malnutrition of mild degree [name]: 75% to less than 90% of standard weight)    Oropharyngeal dysphagia    Osteopenia of neck of left femur    Raynaud's disease    Rheumatoid arthritis    Transient ischemic attack    Slow transit constipation    Thrombophilia    Achalasia    Delusional thoughts    Recurrent falls 04/09/2021   Hypotension 12/14/2020   Hypokalemia 12/14/2020   Hypocalcemia 12/14/2020   Hypomagnesemia 12/14/2020   Non-toxic multinodular goiter 08/20/2020   Acquired hypothyroidism 08/20/2020   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  Vitamin D deficiency 05/01/2020   Proteinuria 05/01/2020   Osteoarthritis of knee 05/01/2020   Osteoarthritis of hip 05/01/2020   Gastroesophageal reflux disease 05/01/2020   Idiopathic pulmonary fibrosis 05/01/2020   Cardiomyopathy 05/01/2020   Hypercoagulability due to atrial fibrillation 05/01/2020   Systemic lupus erythematosus 05/01/2020   Chronic heart failure with preserved ejection fraction 05/01/2020   Chronic kidney disease (CKD), active medical management without dialysis, stage 3 (moderate) 05/01/2020   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   Non-rheumatic atrial fibrillation 05/01/2020    Atherosclerosis of abdominal aorta 05/01/2020   Diverticulosis of colon 05/01/2020   Cholelithiasis without obstruction 05/01/2020   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   AICD (automatic cardioverter/defibrillator) present 11/14/2019   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 11/14/2019   Mitral and aortic incompetence 11/14/2019   Hyperlipidemia 11/14/2019   Pain of left hip joint 12/12/2017   Essential (primary) hypertension 08/18/2017   CHB (complete heart block) 08/18/2017   NSVT (nonsustained ventricular tachycardia) 08/18/2017   Aortic valve disorder 03/26/2002   Past Medical History:  Diagnosis Date   Achalasia    Acquired hypothyroidism 08/20/2020   Acute metabolic encephalopathy 07/25/2021   AICD (automatic cardioverter/defibrillator) present 11/14/2019   Allergic rhinitis    Ankle fracture 03/25/2022   Aortic valve disorder 03/26/2002   Atherosclerosis of abdominal aorta (HCC) 05/01/2020   Cardiomyopathy (HCC)    CHB (complete heart block) (HCC) 08/18/2017   CHF (congestive heart failure) (HCC)    Cholelithiasis without obstruction 05/01/2020   Chronic gouty arthritis    Chronic heart failure with preserved ejection fraction (HCC) 05/01/2020   Chronic kidney disease (CKD), active medical management without dialysis, stage 3 (moderate) (HCC) 05/01/2020   Closed torus fracture of distal end of right radius with delayed healing 08/12/2021   Delusional thoughts (HCC)    Diverticulosis of colon 05/01/2020   Epistaxis    Essential (primary) hypertension 08/18/2017   Gastroesophageal reflux disease 05/01/2020   Gastrointestinal hemorrhage    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 11/14/2019   Hypercoagulability due to atrial fibrillation (HCC) 05/01/2020   Hyperlipidemia 11/14/2019   Hypocalcemia 12/14/2020   Hypoglycemia    Hypokalemia 12/14/2020   Hypomagnesemia 12/14/2020   Hypotension 12/14/2020    Idiopathic pulmonary fibrosis (HCC) 05/01/2020   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   Iron deficiency anemia    Long term (current) use of anticoagulants    Malnutrition of mild degree [name]: 75% to less than 90% of standard weight) (HCC)    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/12/2021   Mitral and aortic incompetence 11/14/2019   Non-rheumatic atrial fibrillation (HCC) 05/01/2020   Non-toxic multinodular goiter 08/20/2020   NSVT (nonsustained ventricular tachycardia) (HCC) 08/18/2017   Oropharyngeal dysphagia    Osteoarthritis of hip 05/01/2020   Osteoarthritis of knee 05/01/2020   Osteopenia of neck of left femur    Pain of left hip joint 12/12/2017   Proteinuria 05/01/2020   Raynaud's disease    Recurrent falls 04/09/2021   Rheumatoid arthritis (HCC)    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   Seizures (HCC)    Septic shock (HCC) 03/10/2020   Slow transit constipation    Systemic lupus erythematosus (HCC) 05/01/2020   Thrombophilia (HCC)    Transient ischemic attack    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   Vitamin D deficiency     Family History  Problem Relation Age of Onset   Heart disease Mother    Hypertension  Mother    Rheum arthritis Mother    Osteoarthritis Mother    Heart disease Father    Hypertension Father    Osteoarthritis Father    Heart disease Sister    Diabetes Brother    Cancer Brother     Past Surgical History:  Procedure Laterality Date   BREAST BIOPSY Left    CARDIAC VALVE SURGERY     CARPAL TUNNEL RELEASE     COLONOSCOPY WITH PROPOFOL N/A 12/20/2020   Procedure: COLONOSCOPY WITH PROPOFOL;  Surgeon: Willis Modena, MD;  Location: [facility] ENDOSCOPY;  Service: Endoscopy;  Laterality: N/A;   HEMORRHOID SURGERY     PACEMAKER INSERTION     RIGHT/LEFT HEART CATH AND CORONARY ANGIOGRAPHY N/A 11/27/2020   Procedure: RIGHT/LEFT HEART CATH AND CORONARY ANGIOGRAPHY;  Surgeon: Dolores Patty, MD;  Location: [facility]  INVASIVE CV LAB;  Service: Cardiovascular;  Laterality: N/A;   TONSILLECTOMY     Social History   Occupational History   Occupation: Retired    Comment: Community education officer business  Tobacco Use   Smoking status: Never    Passive exposure: Past   Smokeless tobacco: Never  Vaping Use   Vaping status: Never Used  Substance and Sexual Activity   Alcohol use: Never   Drug use: Never   Sexual activity: Not Currently

## 2023-03-14 ENCOUNTER — Encounter: Payer: Self-pay | Admitting: Surgical

## 2023-03-14 ENCOUNTER — Other Ambulatory Visit (INDEPENDENT_AMBULATORY_CARE_PROVIDER_SITE_OTHER): Payer: Self-pay

## 2023-03-14 ENCOUNTER — Ambulatory Visit (INDEPENDENT_AMBULATORY_CARE_PROVIDER_SITE_OTHER): Payer: Medicare HMO | Admitting: Surgical

## 2023-03-14 DIAGNOSIS — S4992XA Unspecified injury of left shoulder and upper arm, initial encounter: Secondary | ICD-10-CM

## 2023-03-14 NOTE — Progress Notes (Signed)
 Post-fracture visit Note   Patient: Lindsey Huber           Date of Birth: 07-Jul-1945           MRN: [id number] Visit Date: 03/14/2023 PCP: Collene Mares, PA   Assessment & Plan:  Chief Complaint:  Chief Complaint  Patient presents with   Left Shoulder - Follow-up    Left clavicle fracture   Visit Diagnoses:  1. Injury of left clavicle, initial encounter     Plan: Patient is a 78 year old female who presents for evaluation of lateral clavicle shaft fracture.  Date of injury was 01/08/2023.  She is doing well with no complaints.  She states that she has near full range of motion of the left shoulder.  She has discontinued the sling.  She is not doing any significant amount of lifting at this time but with her daily activities she really has no discomfort or pain.  She never has to take any medication for pain at this point.  She is able to sleep well through the night with her shoulder without any shoulder pain.  She is able to lay on her left side without any discomfort as well.  On exam, patient has 30 degrees X rotation, 90 degrees abduction, 125 degrees forward elevation passively and actively.  Intact rotator cuff strength of supra, infra, subscap rated 5/5.  Actually nerve intact with deltoid firing.  No tenderness over the clavicle shaft or AC joint.  No deformity noted.  There is no pain with crossarm adduction.  Palpable radial pulse of the left upper extremity.  Plan is continue with ADLs as tolerated.  Okay to progressively increase how much she lifts but recommended she hold off on any lifting overhead more than about 5 pounds for at least another month.  She will follow-up with the office as needed if symptoms do not continue to improve.    We also did discuss the possibility of left knee injection once again.  She has prior knee radiographs from May 2024 demonstrating severe medial compartment and moderate to severe lateral compartment osteoarthritis.  She is  considering injection due to her left knee wanting to buckle on her at times.  She will schedule this appointment at her leisure.  Follow-Up Instructions: Return if symptoms worsen or fail to improve.   Orders:  Orders Placed This Encounter  Procedures   XR Clavicle Left   No orders of the defined types were placed in this encounter.   Imaging: XR Clavicle Left Result Date: 03/14/2023 AP and axial views of left clavicle reviewed.  Lateral clavicle fracture again noted with increased callus formation compared with prior radiographs.  No displacement compared with prior radiographs.  No interval fracture or dislocation.   PMFS History: Patient Active Problem List   Diagnosis Date Noted   Chronic a-fib (HCC) 11/10/2022   Anticoagulation management encounter 11/10/2022   S/P AVR 11/10/2022   S/P TVR (tricuspid valve repair) 11/10/2022   S/P mitral valve repair 11/10/2022   Acute lower GI bleeding 11/02/2022   Seizure (HCC) 10/26/2022   ABLA (acute blood loss anemia) 07/04/2022   GIB (gastrointestinal bleeding) 07/02/2022   Hemorrhagic shock (HCC) 07/02/2022   Symptomatic anemia 12/26/2021   Mild dementia 08/12/2021   Allergic rhinitis    Chronic gouty arthritis    Epistaxis    Hypoglycemia    Iron deficiency anemia    Long term (current) use of anticoagulants    Malnutrition of mild degree [name]: 75%  to less than 90% of standard weight)    Oropharyngeal dysphagia    Osteopenia of neck of left femur    Raynaud's disease    Rheumatoid arthritis    Transient ischemic attack    Slow transit constipation    Thrombophilia    Achalasia    Delusional thoughts    Recurrent falls 04/09/2021   Hypotension 12/14/2020   Hypokalemia 12/14/2020   Hypocalcemia 12/14/2020   Hypomagnesemia 12/14/2020   Non-toxic multinodular goiter 08/20/2020   Acquired hypothyroidism 08/20/2020   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   Vitamin D deficiency 05/01/2020   Proteinuria 05/01/2020    Osteoarthritis of knee 05/01/2020   Osteoarthritis of hip 05/01/2020   Gastroesophageal reflux disease 05/01/2020   Idiopathic pulmonary fibrosis 05/01/2020   Cardiomyopathy 05/01/2020   Hypercoagulability due to atrial fibrillation 05/01/2020   Systemic lupus erythematosus 05/01/2020   Chronic heart failure with preserved ejection fraction 05/01/2020   Chronic kidney disease (CKD), active medical management without dialysis, stage 3 (moderate) 05/01/2020   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   Non-rheumatic atrial fibrillation 05/01/2020   Atherosclerosis of abdominal aorta 05/01/2020   Diverticulosis of colon 05/01/2020   Cholelithiasis without obstruction 05/01/2020   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   AICD (automatic cardioverter/defibrillator) present 11/14/2019   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 11/14/2019   Mitral and aortic incompetence 11/14/2019   Hyperlipidemia 11/14/2019   Pain of left hip joint 12/12/2017   Essential (primary) hypertension 08/18/2017   CHB (complete heart block) 08/18/2017   NSVT (nonsustained ventricular tachycardia) 08/18/2017   Aortic valve disorder 03/26/2002   Past Medical History:  Diagnosis Date   Achalasia    Acquired hypothyroidism 08/20/2020   Acute metabolic encephalopathy 07/25/2021   AICD (automatic cardioverter/defibrillator) present 11/14/2019   Allergic rhinitis    Ankle fracture 03/25/2022   Aortic valve disorder 03/26/2002   Atherosclerosis of abdominal aorta (HCC) 05/01/2020   Cardiomyopathy (HCC)    CHB (complete heart block) (HCC) 08/18/2017   CHF (congestive heart failure) (HCC)    Cholelithiasis without obstruction 05/01/2020   Chronic gouty arthritis    Chronic heart failure with preserved ejection fraction (HCC) 05/01/2020   Chronic kidney disease (CKD), active medical management without dialysis, stage 3 (moderate) (HCC) 05/01/2020    Closed torus fracture of distal end of right radius with delayed healing 08/12/2021   Delusional thoughts (HCC)    Diverticulosis of colon 05/01/2020   Epistaxis    Essential (primary) hypertension 08/18/2017   Gastroesophageal reflux disease 05/01/2020   Gastrointestinal hemorrhage    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 11/14/2019   Hypercoagulability due to atrial fibrillation (HCC) 05/01/2020   Hyperlipidemia 11/14/2019   Hypocalcemia 12/14/2020   Hypoglycemia    Hypokalemia 12/14/2020   Hypomagnesemia 12/14/2020   Hypotension 12/14/2020   Idiopathic pulmonary fibrosis (HCC) 05/01/2020   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   Iron deficiency anemia    Long term (current) use of anticoagulants    Malnutrition of mild degree [name]: 75% to less than 90% of standard weight) (HCC)    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/12/2021   Mitral and aortic incompetence 11/14/2019   Non-rheumatic atrial fibrillation (HCC) 05/01/2020   Non-toxic multinodular goiter 08/20/2020   NSVT (nonsustained ventricular tachycardia) (HCC) 08/18/2017   Oropharyngeal dysphagia    Osteoarthritis of hip 05/01/2020   Osteoarthritis of knee 05/01/2020   Osteopenia of neck of left femur    Pain of left hip joint 12/12/2017   Proteinuria  05/01/2020   Raynaud's disease    Recurrent falls 04/09/2021   Rheumatoid arthritis (HCC)    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   Seizures (HCC)    Septic shock (HCC) 03/10/2020   Slow transit constipation    Systemic lupus erythematosus (HCC) 05/01/2020   Thrombophilia (HCC)    Transient ischemic attack    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   Vitamin D deficiency     Family History  Problem Relation Age of Onset   Heart disease Mother    Hypertension Mother    Rheum arthritis Mother    Osteoarthritis Mother    Heart disease Father    Hypertension Father    Osteoarthritis Father    Heart  disease Sister    Diabetes Brother    Cancer Brother     Past Surgical History:  Procedure Laterality Date   BREAST BIOPSY Left    CARDIAC VALVE SURGERY     CARPAL TUNNEL RELEASE     COLONOSCOPY WITH PROPOFOL N/A 12/20/2020   Procedure: COLONOSCOPY WITH PROPOFOL;  Surgeon: Willis Modena, MD;  Location: [facility] ENDOSCOPY;  Service: Endoscopy;  Laterality: N/A;   HEMORRHOID SURGERY     PACEMAKER INSERTION     RIGHT/LEFT HEART CATH AND CORONARY ANGIOGRAPHY N/A 11/27/2020   Procedure: RIGHT/LEFT HEART CATH AND CORONARY ANGIOGRAPHY;  Surgeon: Dolores Patty, MD;  Location: [facility] INVASIVE CV LAB;  Service: Cardiovascular;  Laterality: N/A;   TONSILLECTOMY     Social History   Occupational History   Occupation: Retired    Comment: Community education officer business  Tobacco Use   Smoking status: Never    Passive exposure: Past   Smokeless tobacco: Never  Vaping Use   Vaping status: Never Used  Substance and Sexual Activity   Alcohol use: Never   Drug use: Never   Sexual activity: Not Currently

## 2023-03-21 ENCOUNTER — Encounter: Payer: Self-pay | Admitting: Pulmonary Disease

## 2023-03-21 ENCOUNTER — Ambulatory Visit (INDEPENDENT_AMBULATORY_CARE_PROVIDER_SITE_OTHER): Payer: Medicare HMO | Admitting: Pulmonary Disease

## 2023-03-21 VITALS — BP 136/80 | HR 88 | Ht 63.0 in | Wt 119.8 lb

## 2023-03-21 DIAGNOSIS — J45909 Unspecified asthma, uncomplicated: Secondary | ICD-10-CM

## 2023-03-21 DIAGNOSIS — I272 Pulmonary hypertension, unspecified: Secondary | ICD-10-CM

## 2023-03-21 DIAGNOSIS — Z5181 Encounter for therapeutic drug level monitoring: Secondary | ICD-10-CM | POA: Diagnosis not present

## 2023-03-21 DIAGNOSIS — J849 Interstitial pulmonary disease, unspecified: Secondary | ICD-10-CM

## 2023-03-21 DIAGNOSIS — J84112 Idiopathic pulmonary fibrosis: Secondary | ICD-10-CM

## 2023-03-21 LAB — HEPATIC FUNCTION PANEL
ALT: 14 U/L (ref 0–35)
AST: 29 U/L (ref 0–37)
Albumin: 4.1 g/dL (ref 3.5–5.2)
Alkaline Phosphatase: 50 U/L (ref 39–117)
Bilirubin, Direct: 0.2 mg/dL (ref 0.0–0.3)
Total Bilirubin: 0.9 mg/dL (ref 0.2–1.2)
Total Protein: 8.4 g/dL — ABNORMAL HIGH (ref 6.0–8.3)

## 2023-03-21 MED ORDER — ALBUTEROL SULFATE HFA 108 (90 BASE) MCG/ACT IN AERS: 2.0000 | INHALATION_SPRAY | Freq: Four times a day (QID) | RESPIRATORY_TRACT | 6 refills | Status: AC | PRN

## 2023-03-21 MED ORDER — BREZTRI AEROSPHERE 160-9-4.8 MCG/ACT IN AERO: 2.0000 | INHALATION_SPRAY | Freq: Two times a day (BID) | RESPIRATORY_TRACT | 11 refills | Status: AC | PRN

## 2023-03-21 NOTE — Patient Instructions (Signed)
 Nice to see you again  I refilled Breztri 2 puffs twice a day, rinse mouth after every use  I refilled albuterol, 2 puffs every 4-6 hours as needed for wheeze or shortness of breath, feel free to use this as needed  For nasal congestion use Flonase 1 spray each nostril twice a day for 1 to 2 weeks, if improving can back off to once a day or as needed  In addition, for the nasal congestion, use an antihistamine (Zyrtec, Allegra, Claritin are brand names but generic store brands are okay as well) 1 tablet at night.  Blood work today to keep an eye on the medication Ofev make sure it is not harming your liver  CT scan in December was stable, this is good news  Return to clinic in 6 months or sooner as needed with [name]

## 2023-03-21 NOTE — Progress Notes (Signed)
 @Patient  ID: Lindsey Huber, female    DOB: January 02, 1946, 78 y.o.   MRN: [id number]  Chief Complaint  Patient presents with   Follow-up    Pt is having sob during exertion w/ wheezing (1wk). Pt is having coughing w/ phlegm (clear) Breztri inhaler seems to be helping.     Referring provider: Collene Mares, PA  HPI:   78 y.o. woman whom I am seeing in follow up and ongoing care of DOE and ILD.  ED note 12/2022 reviewed.  Most recent orthopedic surgery note reviewed.  Dyspnea remains, but at baseline.  Occasional wheeze etc.  Finds Breztri helpful.  Albuterol as well.  Repeat CT scan in the interim unchanged, stable fibrosis.  She reports good adherence to Ofev.  HPI at initial visit: Patient recently moved to the area from [place].  Had multiple specialist including cardiology, pulmonary.  Chronic dyspnea for some time.  Diagnosed with RA related ILD.  Started on Ofev in the past.  Continues this.  Occasional diarrhea.  Dyspnea worse on inclines or stairs.  Present on flat surfaces over short distances as well.  No time of day when things are better or worse.  Dyspnea constant.  No seasonal environmental factors she can identify that make this better or worse.  No position make things better or worse.  No clear alleviating exacerbating factors.  She continues on inhalers.  Currently Trelegy.  PMH: ILD, valvular issues, heart issues, Surgical history: Heart valve surgery, pacemaker placement, tonsillectomy Family history: Mother had CAD, hypertension Father with CAD, hypertension Social history: Never smoker, recently moved to the area, lives in [place] / Pulmonary Flowsheets:   ACT:      No data to display          MMRC:     No data to display          Epworth:      No data to display          Tests:   FENO:  No results found for: "NITRICOXIDE"  PFT:     No data to display          WALK:     08/22/2020    2:52 PM  SIX MIN  WALK  Medications levothyroxine at 6am, allopurinol 100mg , hydroxychloroquine 200mg , prednisone 5mg  at 7am, and multivitamin at 12pm  Supplimental Oxygen during Test? (L/min) No  Laps 6  Partial Lap (in Meters) 0 meters  Baseline BP (sitting) 120/72  Baseline Heartrate 91  Baseline Dyspnea (Borg Scale) 3  Baseline Fatigue (Borg Scale) 2  Baseline SPO2 100 %  BP (sitting) 150/100  Heartrate 95  Dyspnea (Borg Scale) 6  Fatigue (Borg Scale) 4  SPO2 97 %  BP (sitting) 138/90  Heartrate 90  SPO2 100 %  Stopped or Paused before Six Minutes Yes  Other Symptoms at end of Exercise paused with 1 min 50 sec remaining for 2 minutes due to needing to catch her breath  Distance Completed 204 meters  Distance Completed 0 meters  Tech Comments: pt walked at a slow pace with a left knee brace on, stumbled multiple times against the wall due to the left knee and brace. pt paused with 1 min 50 sec remaining for 2 min to catch breath but was able to fully finish the walk.   Imaging: None reviewed, requested  Lab Results:  CBC Personally reviewed most recent labs from PCP with minimal records sent    Component Value Date/Time  WBC 6.2 12/07/2022 1432   WBC 8.5 11/10/2022 0946   RBC 3.34 (L) 12/07/2022 1432   RBC 2.55 (L) 11/10/2022 0946   HGB 10.4 (L) 12/07/2022 1432   HCT 33.3 (L) 12/07/2022 1432   PLT 167 12/07/2022 1432   MCV 100 (H) 12/07/2022 1432   MCH 31.1 12/07/2022 1432   MCH 31.0 11/10/2022 0946   MCHC 31.2 (L) 12/07/2022 1432   MCHC 32.1 11/10/2022 0946   RDW 15.4 12/07/2022 1432   LYMPHSABS 2.7 11/02/2022 1523   MONOABS 0.6 11/02/2022 1523   EOSABS 0.0 11/02/2022 1523   BASOSABS 0.0 11/02/2022 1523    BMET    Component Value Date/Time   NA 144 12/07/2022 1432   K 4.0 12/07/2022 1432   CL 103 12/07/2022 1432   CO2 24 12/07/2022 1432   GLUCOSE 70 12/07/2022 1432   GLUCOSE 104 (H) 11/10/2022 2044   BUN 25 12/07/2022 1432   CREATININE 1.15 (H) 12/07/2022 1432    CREATININE 1.11 (H) 05/03/2022 1519   CALCIUM 10.0 12/07/2022 1432   GFRNONAA 50 (L) 11/10/2022 2044    BNP    Component Value Date/Time   BNP 718.0 (H) 12/26/2021 1350    ProBNP No results found for: "PROBNP"  Specialty Problems       Pulmonary Problems   Idiopathic pulmonary fibrosis   Allergic rhinitis   Epistaxis   Oropharyngeal dysphagia    Allergies  Allergen Reactions   Other Other (See Comments)    Patient is to NOT EAT any foods with husks or tree nuts, popcorn   Propofol Shortness Of Breath and Other (See Comments)    "Caused asthma"   Flecainide Other (See Comments)    Reaction not recalled   Amiodarone Other (See Comments)    Delirium/Confusion/Psychosis   Amlodipine Other (See Comments)    "Tired and syncope"    Immunization History  Administered Date(s) Administered   Fluad Trivalent(High Dose 65+) 11/03/2022   Influenza-Unspecified 10/26/2019, 10/07/2021   PFIZER(Purple Top)SARS-COV-2 Vaccination 04/17/2019, 05/08/2019, 11/24/2019    Past Medical History:  Diagnosis Date   Achalasia    Acquired hypothyroidism 08/20/2020   Acute metabolic encephalopathy 07/25/2021   AICD (automatic cardioverter/defibrillator) present 11/14/2019   Allergic rhinitis    Ankle fracture 03/25/2022   Aortic valve disorder 03/26/2002   Atherosclerosis of abdominal aorta (HCC) 05/01/2020   Cardiomyopathy (HCC)    CHB (complete heart block) (HCC) 08/18/2017   CHF (congestive heart failure) (HCC)    Cholelithiasis without obstruction 05/01/2020   Chronic gouty arthritis    Chronic heart failure with preserved ejection fraction (HCC) 05/01/2020   Chronic kidney disease (CKD), active medical management without dialysis, stage 3 (moderate) (HCC) 05/01/2020   Closed torus fracture of distal end of right radius with delayed healing 08/12/2021   Delusional thoughts (HCC)    Diverticulosis of colon 05/01/2020   Epistaxis    Essential (primary) hypertension 08/18/2017    Gastroesophageal reflux disease 05/01/2020   Gastrointestinal hemorrhage    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 11/14/2019   Hypercoagulability due to atrial fibrillation (HCC) 05/01/2020   Hyperlipidemia 11/14/2019   Hypocalcemia 12/14/2020   Hypoglycemia    Hypokalemia 12/14/2020   Hypomagnesemia 12/14/2020   Hypotension 12/14/2020   Idiopathic pulmonary fibrosis (HCC) 05/01/2020   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   Iron deficiency anemia    Long term (current) use of anticoagulants    Malnutrition of mild degree (Gomez: 75% to less than 90% of standard weight) (HCC)  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/12/2021   Mitral and aortic incompetence 11/14/2019   Non-rheumatic atrial fibrillation (HCC) 05/01/2020   Non-toxic multinodular goiter 08/20/2020   NSVT (nonsustained ventricular tachycardia) (HCC) 08/18/2017   Oropharyngeal dysphagia    Osteoarthritis of hip 05/01/2020   Osteoarthritis of knee 05/01/2020   Osteopenia of neck of left femur    Pain of left hip joint 12/12/2017   Proteinuria 05/01/2020   Raynaud's disease    Recurrent falls 04/09/2021   Rheumatoid arthritis (HCC)    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   allopurinol (ZYLOPRIM) 100 MG tablet Take 1 tablet (100 mg total) by mouth daily.    amoxicillin (AMOXIL) 500 MG capsule 4 capsules by mouth 1 hour before dental procedure.   apixaban (ELIQUIS) 2.5 MG TABS tablet Take 1 tablet (2.5 mg total) by mouth 2 (two) times daily.   calcium-vitamin D (OSCAL WITH D) 500-5 MG-MCG tablet Take 2 tablets by mouth 2 (two) times daily.   cetirizine HCl (ZYRTEC) 5 MG/5ML SOLN Take 1 mL by mouth 2 (two) times daily as needed for rhinitis or allergies.   Cholecalciferol (VITAMIN D3) 25 MCG (1000 UT) CAPS Take 1,000 Units by mouth daily with lunch.   furosemide (LASIX) 40 MG tablet Take 1 tablet (40 mg total) by mouth daily.   Glycerin-Hypromellose-PEG 400 (DRY EYE RELIEF DROPS OP) Place 1 drop into both eyes daily as needed (for dry eye).   hydroxychloroquine (PLAQUENIL) 200 MG tablet Take 1 tablet (200 mg total) by mouth daily.   levETIRAcetam (KEPPRA) 500 MG tablet Take 1 tablet (500 mg total) by mouth 2 (two) times daily.   magnesium oxide (MAG-OX) 400 (240 Mg) MG tablet TAKE 1 TABLET BY MOUTH EVERY DAY   Nintedanib (OFEV) 150 MG CAPS Take 1 capsule (150 mg total) by mouth at bedtime.   OLANZapine (ZYPREXA) 2.5 MG tablet TAKE 1 TABLET BY MOUTH EVERYDAY AT BEDTIME   pantoprazole (PROTONIX) 40 MG tablet Take 1 tablet (40 mg total) by mouth 2 (two) times daily.   polyethylene glycol powder (MIRALAX) 17 GM/SCOOP powder Take 17 g by mouth daily as needed for mild constipation.   potassium chloride (KLOR-CON M10) 10 MEQ tablet TAKE 2 TABLETS BY MOUTH DAILY   predniSONE (DELTASONE) 5 MG tablet Take 1 tablet (5 mg total) by mouth daily with breakfast. Patient to double up dose during sick day rule   [DISCONTINUED] albuterol (VENTOLIN HFA) 108 (90 Base) MCG/ACT inhaler Inhale 2 puffs into the lungs every 6 (six) hours as needed for wheezing or shortness of breath.   [DISCONTINUED] Budeson-Glycopyrrol-Formoterol (BREZTRI AEROSPHERE) 160-9-4.8 MCG/ACT AERO Inhale 2 puffs into the lungs 2 (two) times daily as needed ("for flares").   albuterol (VENTOLIN HFA)  108 (90 Base) MCG/ACT inhaler Inhale 2 puffs into the lungs every 6 (six) hours as needed  for wheezing or shortness of breath.   Budeson-Glycopyrrol-Formoterol (BREZTRI AEROSPHERE) 160-9-4.8 MCG/ACT AERO Inhale 2 puffs into the lungs 2 (two) times daily as needed ("for flares").   fluticasone (FLONASE) 50 MCG/ACT nasal spray PLACE 1 SPRAY INTO BOTH NOSTRILS 2 (TWO) TIMES DAILY (Patient not taking: Reported on 03/21/2023)   No facility-administered encounter medications on file as of 03/21/2023.     Review of Systems  Review of Systems  N/a Physical Exam  BP 136/80 (BP Location: Right Arm, Patient Position: Sitting, Cuff Size: Normal)   Pulse 88   Ht 5\' 3"  (1.6 m)   Wt 119 lb 12.8 oz (54.3 kg)   SpO2 99%   BMI 21.22 kg/m   Wt Readings from Last 5 Encounters:  03/21/23 119 lb 12.8 oz (54.3 kg)  01/08/23 119 lb 14.9 oz (54.4 kg)  01/05/23 120 lb (54.4 kg)  12/13/22 119 lb (54 kg)  12/08/22 120 lb (54.4 kg)    BMI Readings from Last 5 Encounters:  03/21/23 21.22 kg/m  01/08/23 21.24 kg/m  01/05/23 21.26 kg/m  12/13/22 21.08 kg/m  12/08/22 21.26 kg/m     Physical Exam General: Frail, sitting in chair Eyes: EOMI, no icterus Neck: Supple, no JVP Pulmonary: Crackles in bilateral bases, otherwise clear to auscultation bilaterally Abdomen: Nondistended, bowel sounds present MSK: No synovitis, joint effusion Psych: Normal mood, full affect   Assessment & Plan:   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  To continue this.  High-resolution CT scan 12/24 unchanged.  LFTs ordered to be obtained today given need for intensive drug monitoring for Ofev.  Dyspnea exertion: Related to ILD, pulmonary hypertension, asthma.  On therapy for ILD as above.  Mild improvement with escalation of inhaler therapy to [facility].  Asthma: Increase DOE, wheeze, atopic symptoms summer 2022.  Transition Trelegy to [facility] with improved symptoms.  Eosinophils not elevated.   Consider checking IgE and consider escalation to [facility] in the future.  She has not had much wheezing, lungs are clear, do not suspect asthma as primary driver of her dyspnea, most likely ILD.  Pulmonary hypertension:  Never on therapy.  Right heart cath 11/2020 with mild pulmonary hypertension a PVR of 2, normal wedge.  Reportedly significant valvular disease, like combination  of group 2 and group 3.  She is poor candidate for therapy based on her age, frailty, etiology of disease.    Return in about 6 months (around 09/18/2023) for f/u [name].   Karren Burly, MD 03/21/2023

## 2023-03-23 ENCOUNTER — Other Ambulatory Visit: Payer: Self-pay | Admitting: Physician Assistant

## 2023-03-28 NOTE — Progress Notes (Signed)
 Remote ICD transmission.

## 2023-04-17 ENCOUNTER — Other Ambulatory Visit: Payer: Self-pay

## 2023-04-17 ENCOUNTER — Encounter (HOSPITAL_COMMUNITY): Payer: Self-pay

## 2023-04-17 ENCOUNTER — Observation Stay (HOSPITAL_COMMUNITY)
Admission: EM | Admit: 2023-04-17 | Discharge: 2023-04-20 | Disposition: A | Discharge status: Home / Self Care | Attending: Internal Medicine | Admitting: Internal Medicine

## 2023-04-17 ENCOUNTER — Emergency Department (HOSPITAL_COMMUNITY)

## 2023-04-17 DIAGNOSIS — R5381 Other malaise: Secondary | ICD-10-CM | POA: Insufficient documentation

## 2023-04-17 DIAGNOSIS — Z7901 Long term (current) use of anticoagulants: Secondary | ICD-10-CM | POA: Diagnosis not present

## 2023-04-17 DIAGNOSIS — I482 Chronic atrial fibrillation, unspecified: Secondary | ICD-10-CM | POA: Diagnosis not present

## 2023-04-17 DIAGNOSIS — Z8719 Personal history of other diseases of the digestive system: Secondary | ICD-10-CM | POA: Diagnosis present

## 2023-04-17 DIAGNOSIS — G40909 Epilepsy, unspecified, not intractable, without status epilepticus: Secondary | ICD-10-CM | POA: Insufficient documentation

## 2023-04-17 DIAGNOSIS — I130 Hypertensive heart and chronic kidney disease with heart failure and stage 1 through stage 4 chronic kidney disease, or unspecified chronic kidney disease: Secondary | ICD-10-CM | POA: Insufficient documentation

## 2023-04-17 DIAGNOSIS — G4089 Other seizures: Secondary | ICD-10-CM | POA: Insufficient documentation

## 2023-04-17 DIAGNOSIS — I10 Essential (primary) hypertension: Secondary | ICD-10-CM | POA: Diagnosis present

## 2023-04-17 DIAGNOSIS — K625 Hemorrhage of anus and rectum: Secondary | ICD-10-CM | POA: Diagnosis present

## 2023-04-17 DIAGNOSIS — K922 Gastrointestinal hemorrhage, unspecified: Secondary | ICD-10-CM | POA: Diagnosis not present

## 2023-04-17 DIAGNOSIS — K921 Melena: Secondary | ICD-10-CM

## 2023-04-17 DIAGNOSIS — I4821 Permanent atrial fibrillation: Secondary | ICD-10-CM

## 2023-04-17 DIAGNOSIS — Z95810 Presence of automatic (implantable) cardiac defibrillator: Secondary | ICD-10-CM | POA: Diagnosis present

## 2023-04-17 DIAGNOSIS — D62 Acute posthemorrhagic anemia: Secondary | ICD-10-CM | POA: Diagnosis not present

## 2023-04-17 DIAGNOSIS — M329 Systemic lupus erythematosus, unspecified: Secondary | ICD-10-CM | POA: Insufficient documentation

## 2023-04-17 DIAGNOSIS — K219 Gastro-esophageal reflux disease without esophagitis: Secondary | ICD-10-CM | POA: Diagnosis not present

## 2023-04-17 DIAGNOSIS — N1831 Chronic kidney disease, stage 3a: Secondary | ICD-10-CM | POA: Diagnosis not present

## 2023-04-17 DIAGNOSIS — N183 Chronic kidney disease, stage 3 unspecified: Secondary | ICD-10-CM | POA: Diagnosis present

## 2023-04-17 DIAGNOSIS — I5033 Acute on chronic diastolic (congestive) heart failure: Secondary | ICD-10-CM | POA: Diagnosis not present

## 2023-04-17 DIAGNOSIS — Z79899 Other long term (current) drug therapy: Secondary | ICD-10-CM | POA: Diagnosis not present

## 2023-04-17 DIAGNOSIS — R569 Unspecified convulsions: Secondary | ICD-10-CM

## 2023-04-17 DIAGNOSIS — I5032 Chronic diastolic (congestive) heart failure: Secondary | ICD-10-CM | POA: Diagnosis present

## 2023-04-17 DIAGNOSIS — M109 Gout, unspecified: Secondary | ICD-10-CM | POA: Insufficient documentation

## 2023-04-17 LAB — COMPREHENSIVE METABOLIC PANEL
ALT: 13 U/L (ref 0–44)
AST: 31 U/L (ref 15–41)
Albumin: 3.2 g/dL — ABNORMAL LOW (ref 3.5–5.0)
Alkaline Phosphatase: 33 U/L — ABNORMAL LOW (ref 38–126)
Anion gap: 11 (ref 5–15)
BUN: 20 mg/dL (ref 8–23)
CO2: 26 mmol/L (ref 22–32)
Calcium: 9.1 mg/dL (ref 8.9–10.3)
Chloride: 101 mmol/L (ref 98–111)
Creatinine, Ser: 1.02 mg/dL — ABNORMAL HIGH (ref 0.44–1.00)
GFR, Estimated: 57 mL/min — ABNORMAL LOW (ref >60)
Glucose, Bld: 74 mg/dL (ref 70–99)
Potassium: 3.7 mmol/L (ref 3.5–5.1)
Sodium: 138 mmol/L (ref 135–145)
Total Bilirubin: 0.8 mg/dL (ref 0.0–1.2)
Total Protein: 6.9 g/dL (ref 6.5–8.1)

## 2023-04-17 LAB — HEMOGLOBIN AND HEMATOCRIT, BLOOD
HCT: 28.6 % — ABNORMAL LOW (ref 36.0–46.0)
HCT: 31.9 % — ABNORMAL LOW (ref 36.0–46.0)
Hemoglobin: 9.1 g/dL — ABNORMAL LOW (ref 12.0–15.0)
Hemoglobin: 10.2 g/dL — ABNORMAL LOW (ref 12.0–15.0)

## 2023-04-17 LAB — LACTIC ACID, PLASMA
Lactic Acid, Venous: 0.9 mmol/L (ref 0.5–1.9)
Lactic Acid, Venous: 1.9 mmol/L (ref 0.5–1.9)

## 2023-04-17 LAB — CBC
HCT: 32 % — ABNORMAL LOW (ref 36.0–46.0)
Hemoglobin: 10.3 g/dL — ABNORMAL LOW (ref 12.0–15.0)
MCH: 32.3 pg (ref 26.0–34.0)
MCHC: 32.2 g/dL (ref 30.0–36.0)
MCV: 100.3 fL — ABNORMAL HIGH (ref 80.0–100.0)
Platelets: 134 10*3/uL — ABNORMAL LOW (ref 150–400)
RBC: 3.19 MIL/uL — ABNORMAL LOW (ref 3.87–5.11)
RDW: 15 % (ref 11.5–15.5)
WBC: 6 10*3/uL (ref 4.0–10.5)
nRBC: 0 % (ref 0.0–0.2)

## 2023-04-17 MED ORDER — BUDESON-GLYCOPYRROL-FORMOTEROL 160-9-4.8 MCG/ACT IN AERO: 2.0000 | INHALATION_SPRAY | Freq: Two times a day (BID) | RESPIRATORY_TRACT | Status: DC | PRN

## 2023-04-17 MED ORDER — ONDANSETRON HCL 4 MG/2ML IJ SOLN
4.0000 mg | Freq: Once | INTRAMUSCULAR | Status: AC
  Administered 2023-04-17: 4 mg via INTRAVENOUS
  Filled 2023-04-17: qty 2

## 2023-04-17 MED ORDER — HYDROXYCHLOROQUINE SULFATE 200 MG PO TABS
200.0000 mg | ORAL_TABLET | Freq: Every day | ORAL | Status: DC
  Administered 2023-04-18 – 2023-04-21 (×4): 200 mg via ORAL
  Filled 2023-04-17 (×4): qty 1

## 2023-04-17 MED ORDER — OYSTER SHELL CALCIUM/D3 500-5 MG-MCG PO TABS
2.0000 | ORAL_TABLET | Freq: Two times a day (BID) | ORAL | Status: DC
  Administered 2023-04-18 – 2023-04-21 (×7): 2 via ORAL
  Filled 2023-04-17 (×8): qty 2

## 2023-04-17 MED ORDER — FLUTICASONE FUROATE-VILANTEROL 100-25 MCG/ACT IN AEPB
1.0000 | INHALATION_SPRAY | Freq: Every day | RESPIRATORY_TRACT | Status: DC
  Administered 2023-04-18 – 2023-04-21 (×4): 1 via RESPIRATORY_TRACT
  Filled 2023-04-17: qty 28

## 2023-04-17 MED ORDER — ACETAMINOPHEN 500 MG PO TABS: 1000.0000 mg | ORAL_TABLET | Freq: Four times a day (QID) | ORAL | Status: DC | PRN

## 2023-04-17 MED ORDER — POLYETHYLENE GLYCOL 3350 17 G PO PACK
17.0000 g | PACK | Freq: Every day | ORAL | Status: DC | PRN
  Filled 2023-04-17: qty 1

## 2023-04-17 MED ORDER — SODIUM CHLORIDE 0.9% FLUSH: 3.0000 mL | INTRAVENOUS | Status: DC | PRN

## 2023-04-17 MED ORDER — LEVETIRACETAM 500 MG PO TABS
500.0000 mg | ORAL_TABLET | Freq: Two times a day (BID) | ORAL | Status: DC
  Administered 2023-04-17 – 2023-04-21 (×8): 500 mg via ORAL
  Filled 2023-04-17 (×8): qty 1

## 2023-04-17 MED ORDER — HYDRALAZINE HCL 20 MG/ML IJ SOLN: 5.0000 mg | INTRAMUSCULAR | Status: DC | PRN

## 2023-04-17 MED ORDER — LORATADINE 10 MG PO TABS: 10.0000 mg | ORAL_TABLET | Freq: Every day | ORAL | Status: DC | PRN

## 2023-04-17 MED ORDER — FLUTICASONE PROPIONATE 50 MCG/ACT NA SUSP
1.0000 | Freq: Every day | NASAL | Status: DC
  Administered 2023-04-18 – 2023-04-21 (×4): 1 via NASAL
  Filled 2023-04-17 (×2): qty 16

## 2023-04-17 MED ORDER — UMECLIDINIUM BROMIDE 62.5 MCG/ACT IN AEPB
1.0000 | INHALATION_SPRAY | Freq: Every day | RESPIRATORY_TRACT | Status: DC
  Administered 2023-04-18 – 2023-04-21 (×4): 1 via RESPIRATORY_TRACT
  Filled 2023-04-17: qty 7

## 2023-04-17 MED ORDER — VITAMIN D 25 MCG (1000 UNIT) PO TABS
1000.0000 [IU] | ORAL_TABLET | Freq: Every day | ORAL | Status: DC
  Administered 2023-04-18 – 2023-04-21 (×4): 1000 [IU] via ORAL
  Filled 2023-04-17 (×5): qty 1

## 2023-04-17 MED ORDER — IOHEXOL 350 MG/ML SOLN
75.0000 mL | Freq: Once | INTRAVENOUS | Status: AC | PRN
  Administered 2023-04-17: 75 mL via INTRAVENOUS

## 2023-04-17 MED ORDER — SODIUM CHLORIDE 0.9% FLUSH
3.0000 mL | Freq: Two times a day (BID) | INTRAVENOUS | Status: DC
  Administered 2023-04-17 – 2023-04-20 (×6): 10 mL via INTRAVENOUS
  Administered 2023-04-20: 3 mL via INTRAVENOUS
  Administered 2023-04-21: 10 mL via INTRAVENOUS

## 2023-04-17 MED ORDER — MAGNESIUM OXIDE -MG SUPPLEMENT 400 (240 MG) MG PO TABS
400.0000 mg | ORAL_TABLET | Freq: Every day | ORAL | Status: DC
  Administered 2023-04-18 – 2023-04-21 (×4): 400 mg via ORAL
  Filled 2023-04-17 (×5): qty 1

## 2023-04-17 MED ORDER — ALBUTEROL SULFATE (2.5 MG/3ML) 0.083% IN NEBU: 3.0000 mL | INHALATION_SOLUTION | Freq: Four times a day (QID) | RESPIRATORY_TRACT | Status: DC | PRN

## 2023-04-17 MED ORDER — PANTOPRAZOLE SODIUM 40 MG IV SOLR
40.0000 mg | Freq: Two times a day (BID) | INTRAVENOUS | Status: DC
  Administered 2023-04-17 – 2023-04-21 (×8): 40 mg via INTRAVENOUS
  Filled 2023-04-17 (×8): qty 10

## 2023-04-17 NOTE — Assessment & Plan Note (Addendum)
 Lab Results  Component Value Date   CREATININE 1.02 (H) 04/17/2023   CREATININE 1.15 (H) 12/07/2022   CREATININE 1.14 (H) 11/10/2022  Avoid contrast and renally dose and needed medications.

## 2023-04-17 NOTE — Assessment & Plan Note (Addendum)
    Latest Ref Rng & Units 04/17/2023    2:05 PM 12/07/2022    2:32 PM 11/11/2022   12:49 PM  CBC  WBC 4.0 - 10.5 K/uL 6.0  6.2    Hemoglobin 12.0 - 15.0 g/dL 40.9  81.1  8.3   Hematocrit 36.0 - 46.0 % 32.0  33.3  25.4   Platelets 150 - 400 K/uL 134  167    Will type and screen, IV PPI and transfuse as deemed appropriate.

## 2023-04-17 NOTE — H&P (Signed)
 History and Physical    Patient: Lindsey Huber ZOX:[id number] DOB: 07/20/1945 DOA: 04/17/2023 DOS: the patient was seen and examined on 04/17/2023 PCP: Collene Mares, PA  Patient coming from: Home Chief complaint: Chief Complaint  Patient presents with   Rectal Bleeding   Nausea   HPI:  Lindsey Huber is a 78 y.o. 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, COPD, diastolic CHF, OSA, aortic, mitral, tricuspid valve disease s/p AVR/MV ring/TVR, CKD 3 A coming for rectal bleeding. Pts' rectal bleeding and GIB history has happened in past when she has needed Kcentra and vit K and vasopressors therapy, at which time patient's bleeding was attributed to diverticulitis. Pt states that her eliquis was resumed because of her valvular and heart history.   >>ED Course: Pt in ed is alert awake and oriented. >>Vital signs in the ED were notable for the following: Vitals are stable. Vitals:   04/17/23 1730 04/17/23 1745 04/17/23 1800 04/17/23 1825  BP: 105/70 115/61 102/66   Pulse: 69 70 70   Temp:    97.7 F (36.5 C)  Resp: 13 20 16    Height:      Weight:      SpO2: 100% 100% 100%   TempSrc:    Oral  BMI (Calculated):       >>Labs were notable for the following: CMP shows AKI with a creatinine of 1.02, normal LFTs. Normal lactic acid. CBC shows hemoglobin of 10.3 platelets of 134  >>EKG: Independently reviewed: Ordered and pending.  >>Imaging and additional notable ED work-up:  Cta abd /pelvis negative for active bleed source.   >>While in the ED patient received the following: Medications  acetaminophen (TYLENOL) tablet 1,000 mg (has no administration in time range)  Vitamin D3 CAPS 1,000 Units (has no administration in time range)  fluticasone (FLONASE) 50 MCG/ACT nasal spray 1 spray (has no administration in time  range)  calcium-vitamin D (OSCAL WITH D) 500-5 MG-MCG per tablet 2 tablet (has no administration in time range)  cetirizine HCl (Zyrtec) 5 MG/5ML solution 1 mg (has no administration in time range)  polyethylene glycol powder (GLYCOLAX/MIRALAX) container 17 g (has no administration in time range)  magnesium oxide (MAG-OX) tablet 400 mg (has no administration in time range)  budeson-glycopyrrolate-formoterol 160-9-4.8 MCG/ACT AERO 2 puff (has no administration in time range)  albuterol (VENTOLIN HFA) 108 (90 Base) MCG/ACT inhaler 2 puff (has no administration in time range)  hydroxychloroquine (PLAQUENIL) tablet 200 mg (has no administration in time range)  levETIRAcetam (KEPPRA) tablet 500 mg (has no administration in time range)  ondansetron (ZOFRAN) injection 4 mg (4 mg Intravenous Given 04/17/23 1456)  iohexol (OMNIPAQUE) 350 MG/ML injection 75 mL (75 mLs Intravenous Contrast Given 04/17/23 1530)   Review of Systems  Gastrointestinal:  Positive for blood in stool and nausea.   Past Medical History:  Diagnosis Date   Achalasia    Acquired hypothyroidism 08/20/2020   Acute metabolic encephalopathy 07/25/2021   AICD (automatic cardioverter/defibrillator) present 11/14/2019   Allergic rhinitis    Ankle fracture 03/25/2022   Aortic valve disorder 03/26/2002   Atherosclerosis of abdominal aorta (HCC) 05/01/2020   Cardiomyopathy (HCC)    CHB (complete heart block) (HCC) 08/18/2017   CHF (congestive heart failure) (HCC)    Cholelithiasis without obstruction 05/01/2020   Chronic gouty arthritis    Chronic heart failure with preserved ejection fraction (HCC) 05/01/2020  Chronic kidney disease (CKD), active medical management without dialysis, stage 3 (moderate) (HCC) 05/01/2020   Closed torus fracture of distal end of right radius with delayed healing 08/12/2021   Delusional thoughts (HCC)    Diverticulosis of colon 05/01/2020   Epistaxis    Essential (primary) hypertension 08/18/2017    Gastroesophageal reflux disease 05/01/2020   Gastrointestinal hemorrhage    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 11/14/2019   Hypercoagulability due to atrial fibrillation (HCC) 05/01/2020   Hyperlipidemia 11/14/2019   Hypocalcemia 12/14/2020   Hypoglycemia    Hypokalemia 12/14/2020   Hypomagnesemia 12/14/2020   Hypotension 12/14/2020   Idiopathic pulmonary fibrosis (HCC) 05/01/2020   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   Iron deficiency anemia    Long term (current) use of anticoagulants    Malnutrition of mild degree [name]: 75% to less than 90% of standard weight) (HCC)    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/12/2021   Mitral and aortic incompetence 11/14/2019   Non-rheumatic atrial fibrillation (HCC) 05/01/2020   Non-toxic multinodular goiter 08/20/2020   NSVT (nonsustained ventricular tachycardia) (HCC) 08/18/2017   Oropharyngeal dysphagia    Osteoarthritis of hip 05/01/2020   Osteoarthritis of knee 05/01/2020   Osteopenia of neck of left femur    Pain of left hip joint 12/12/2017   Proteinuria 05/01/2020   Raynaud's disease    Recurrent falls 04/09/2021   Rheumatoid arthritis (HCC)    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   Seizures (HCC)    Septic shock (HCC) 03/10/2020   Slow transit constipation    Systemic lupus erythematosus (HCC) 05/01/2020   Thrombophilia (HCC)    Transient ischemic attack    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   Vitamin D deficiency    Past Surgical History:  Procedure Laterality Date   BREAST BIOPSY Left    CARDIAC VALVE SURGERY     CARPAL TUNNEL RELEASE     COLONOSCOPY WITH PROPOFOL N/A 12/20/2020   Procedure: COLONOSCOPY WITH PROPOFOL;  Surgeon: Willis Modena, MD;  Location: [facility] ENDOSCOPY;  Service: Endoscopy;  Laterality: N/A;   HEMORRHOID SURGERY     PACEMAKER INSERTION     RIGHT/LEFT HEART CATH AND CORONARY ANGIOGRAPHY N/A 11/27/2020   Procedure: RIGHT/LEFT  HEART CATH AND CORONARY ANGIOGRAPHY;  Surgeon: Dolores Patty, MD;  Location: [facility] INVASIVE CV LAB;  Service: Cardiovascular;  Laterality: N/A;   TONSILLECTOMY      reports that she has never smoked. She has been exposed to tobacco smoke. She has never used smokeless tobacco. She reports that she does not drink alcohol and does not use drugs.  Allergies  Allergen Reactions   Other Other (See Comments)    Patient is to NOT EAT any foods with husks or tree nuts, popcorn   Propofol Shortness Of Breath and Other (See Comments)    "Caused asthma"   Flecainide Other (See Comments)    Reaction not recalled   Amiodarone Other (See Comments)    Delirium/Confusion/Psychosis   Amlodipine Other (See Comments)    "Tired and syncope"    Family History  Problem Relation Age of Onset   Heart disease Mother    Hypertension Mother    Rheum arthritis Mother    Osteoarthritis Mother    Heart disease Father    Hypertension Father    Osteoarthritis Father    Heart disease Sister    Diabetes Brother    Cancer Brother     Prior to Admission medications   Medication Sig Start Date  End Date Taking? Authorizing Provider  acetaminophen (TYLENOL) 500 MG tablet Take 1,000 mg by mouth every 6 (six) hours as needed (pain).    [provider]  albuterol (VENTOLIN HFA) 108 (90 Base) MCG/ACT inhaler Inhale 2 puffs into the lungs every 6 (six) hours as needed for wheezing or shortness of breath. 03/21/23   Hunsucker, Lesia Sago, MD  allopurinol (ZYLOPRIM) 100 MG tablet Take 1 tablet (100 mg total) by mouth daily. 01/05/23   Rice, Jamesetta Orleans, MD  amoxicillin (AMOXIL) 500 MG capsule 4 capsules by mouth 1 hour before dental procedure. 12/30/21   [provider]  apixaban (ELIQUIS) 2.5 MG TABS tablet Take 1 tablet (2.5 mg total) by mouth 2 (two) times daily. 12/17/22 06/15/23  Lanier Prude, MD  Budeson-Glycopyrrol-Formoterol (BREZTRI AEROSPHERE) 160-9-4.8 MCG/ACT AERO Inhale 2 puffs into  the lungs 2 (two) times daily as needed ("for flares"). 03/21/23   Hunsucker, Lesia Sago, MD  calcium-vitamin D (OSCAL WITH D) 500-5 MG-MCG tablet Take 2 tablets by mouth 2 (two) times daily. 07/10/22   Elgergawy, Leana Roe, MD  cetirizine HCl (ZYRTEC) 5 MG/5ML SOLN Take 1 mL by mouth 2 (two) times daily as needed for rhinitis or allergies.    [provider]  Cholecalciferol (VITAMIN D3) 25 MCG (1000 UT) CAPS Take 1,000 Units by mouth daily with lunch.    [provider]  fluticasone (FLONASE) 50 MCG/ACT nasal spray PLACE 1 SPRAY INTO BOTH NOSTRILS 2 (TWO) TIMES DAILY Patient not taking: Reported on 03/21/2023 05/11/22   Hunsucker, Lesia Sago, MD  furosemide (LASIX) 40 MG tablet Take 1 tablet (40 mg total) by mouth daily. 07/30/22   Jacklynn Ganong, FNP  Glycerin-Hypromellose-PEG 400 (DRY EYE RELIEF DROPS OP) Place 1 drop into both eyes daily as needed (for dry eye).    [provider]  hydroxychloroquine (PLAQUENIL) 200 MG tablet Take 1 tablet (200 mg total) by mouth daily. 01/05/23   Fuller Plan, MD  levETIRAcetam (KEPPRA) 500 MG tablet Take 1 tablet (500 mg total) by mouth 2 (two) times daily. 10/27/22   Pokhrel, Rebekah Chesterfield, MD  magnesium oxide (MAG-OX) 400 (240 Mg) MG tablet TAKE 1 TABLET BY MOUTH EVERY DAY 08/25/22   Lanier Prude, MD  Nintedanib (OFEV) 150 MG CAPS Take 1 capsule (150 mg total) by mouth at bedtime. 12/13/22   Hunsucker, Lesia Sago, MD  OLANZapine (ZYPREXA) 2.5 MG tablet TAKE 1 TABLET BY MOUTH EVERYDAY AT BEDTIME 01/20/23   Jaffe, Adam R, DO  pantoprazole (PROTONIX) 40 MG tablet Take 1 tablet (40 mg total) by mouth 2 (two) times daily. 11/11/22 11/11/23  Regalado, Belkys A, MD  polyethylene glycol powder (MIRALAX) 17 GM/SCOOP powder Take 17 g by mouth daily as needed for mild constipation. 07/03/21   [provider]  potassium chloride (KLOR-CON M10) 10 MEQ tablet TAKE 2 TABLETS BY MOUTH EVERY DAY 03/23/23   Sheilah Pigeon, PA-C  predniSONE  (DELTASONE) 5 MG tablet Take 1 tablet (5 mg total) by mouth daily with breakfast. Patient to double up dose during sick day rule 11/30/22   Shamleffer, Konrad Dolores, MD  Vitals:   04/17/23 1730 04/17/23 1745 04/17/23 1800 04/17/23 1825  BP: 105/70 115/61 102/66   Pulse: 69 70 70   Resp: 13 20 16    Temp:    97.7 F (36.5 C)  TempSrc:    Oral  SpO2: 100% 100% 100%   Weight:      Height:       Physical Exam Vitals and nursing note reviewed.  Constitutional:      General: She is not in acute distress.    Appearance: She is underweight.  HENT:     Head: Normocephalic and atraumatic.     Right Ear: Hearing normal.     Left Ear: Hearing normal.     Nose: Nose normal.     Mouth/Throat:     Lips: Pink.     Tongue: No lesions.     Pharynx: Oropharynx is clear.  Eyes:     General: Lids are normal.     Extraocular Movements: Extraocular movements intact.  Cardiovascular:     Rate and Rhythm: Normal rate and regular rhythm.     Pulses:          Dorsalis pedis pulses are 2+ on the right side and 2+ on the left side.       Posterior tibial pulses are 2+ on the right side and 2+ on the left side.     Heart sounds: Murmur heard.     Systolic murmur is present.  Pulmonary:     Effort: Pulmonary effort is normal.     Breath sounds: Normal breath sounds.  Abdominal:     General: Bowel sounds are normal. There is no distension.     Palpations: Abdomen is soft. There is no mass.     Tenderness: There is no abdominal tenderness.  Musculoskeletal:     Right lower leg: No edema.     Left lower leg: No edema.  Skin:    General: Skin is warm.  Neurological:     General: No focal deficit present.     Mental Status: She is alert and oriented to person, place, and time.     Cranial Nerves: Cranial nerves 2-12 are intact.  Psychiatric:        Attention and Perception: Attention normal.        Mood and  Affect: Mood normal.        Speech: Speech normal.        Behavior: Behavior normal. Behavior is cooperative.     Labs on Admission: I have personally reviewed following labs and imaging studies  CBC: Recent Labs  Lab 04/17/23 1405  WBC 6.0  HGB 10.3*  HCT 32.0*  MCV 100.3*  PLT 134*   Basic Metabolic Panel: Recent Labs  Lab 04/17/23 1405  NA 138  K 3.7  CL 101  CO2 26  GLUCOSE 74  BUN 20  CREATININE 1.02*  CALCIUM 9.1   GFR: Estimated Creatinine Clearance: 39.4 mL/min (A) (by C-G formula based on SCr of 1.02 mg/dL (H)). Liver Function Tests: Recent Labs  Lab 04/17/23 1405  AST 31  ALT 13  ALKPHOS 33*  BILITOT 0.8  PROT 6.9  ALBUMIN 3.2*   No results for input(s): "LIPASE", "AMYLASE" in the last 168 hours. No results for input(s): "AMMONIA" in the last 168 hours. Coagulation Profile: No results for input(s): "INR", "PROTIME" in the last 168 hours. Cardiac Enzymes: No results for input(s): "CKTOTAL", "CKMB", "CKMBINDEX", "TROPONINI" in the last 168 hours. BNP (last 3 results) No  results for input(s): "PROBNP" in the last 8760 hours. HbA1C: No results for input(s): "HGBA1C" in the last 72 hours. CBG: No results for input(s): "GLUCAP" in the last 168 hours. Lipid Profile: No results for input(s): "CHOL", "HDL", "LDLCALC", "TRIG", "CHOLHDL", "LDLDIRECT" in the last 72 hours. Thyroid Function Tests: No results for input(s): "TSH", "T4TOTAL", "FREET4", "T3FREE", "THYROIDAB" in the last 72 hours. Anemia Panel: No results for input(s): "VITAMINB12", "FOLATE", "FERRITIN", "TIBC", "IRON", "RETICCTPCT" in the last 72 hours. Urine analysis:    Component Value Date/Time   COLORURINE COLORLESS (A) 10/26/2022 1710   APPEARANCEUR CLEAR 10/26/2022 1710   LABSPEC 1.004 (L) 10/26/2022 1710   PHURINE 7.0 10/26/2022 1710   GLUCOSEU NEGATIVE 10/26/2022 1710   HGBUR NEGATIVE 10/26/2022 1710   BILIRUBINUR NEGATIVE 10/26/2022 1710   KETONESUR NEGATIVE 10/26/2022 1710    PROTEINUR NEGATIVE 10/26/2022 1710   NITRITE NEGATIVE 10/26/2022 1710   LEUKOCYTESUR NEGATIVE 10/26/2022 1710   Radiological Exams on Admission: CT Angio Abd/Pel W and/or Wo Contrast Result Date: 04/17/2023 CLINICAL DATA:  Rectal bleeding.  Possible acute lower GI bleed. EXAM: CTA ABDOMEN AND PELVIS WITHOUT AND WITH CONTRAST TECHNIQUE: Multidetector CT imaging of the abdomen and pelvis was performed using the standard protocol during bolus administration of intravenous contrast. Multiplanar reconstructed images and MIPs were obtained and reviewed to evaluate the vascular anatomy. RADIATION DOSE REDUCTION: This exam was performed according to the departmental dose-optimization program which includes automated exposure control, adjustment of the mA and/or kV according to patient size and/or use of iterative reconstruction technique. CONTRAST:  75mL OMNIPAQUE IOHEXOL 350 MG/ML SOLN COMPARISON:  None Available. FINDINGS: VASCULAR Aorta: Normal caliber aorta without aneurysm, dissection, vasculitis or significant stenosis. Scattered atherosclerotic vascular calcifications. Celiac: Patent without evidence of aneurysm, dissection, vasculitis or significant stenosis. SMA: Patent without evidence of aneurysm, dissection, vasculitis or significant stenosis. Renals: Renal arteries are patent without evidence of aneurysm, dissection, vasculitis, fibromuscular dysplasia or significant stenosis. Small accessory left renal artery. IMA: Patent without evidence of aneurysm, dissection, vasculitis or significant stenosis. Inflow: Patent without evidence of aneurysm, dissection, vasculitis or significant stenosis. Proximal Outflow: Bilateral common femoral and visualized portions of the superficial and profunda femoral arteries are patent without evidence of aneurysm, dissection, vasculitis or significant stenosis. Veins: No focal venous abnormality. Review of the MIP images confirms the above findings. NON-VASCULAR Lower chest:  Marked cardiomegaly. Partially imaged cardiac rhythm maintenance device with leads in the right atrium and right ventricle. Severe biatrial enlargement. Evidence of prior aortic, mitral and tricuspid valve replacement. No pericardial effusion. Subpleural reticulation, architectural distortion and honeycombing in the lung bases consistent with usual interstitial pneumonitis. Hepatobiliary: Normal hepatic contour and morphology. No discrete hepatic lesion. High attenuation stones layer in the gallbladder lumen. No biliary ductal dilatation. Pancreas: Unremarkable. No pancreatic ductal dilatation or surrounding inflammatory changes. Spleen: No splenic injury or perisplenic hematoma. Adrenals/Urinary Tract: Adrenal glands are unremarkable. Kidneys are normal, without renal calculi, focal lesion, or hydronephrosis. Bladder is unremarkable. Stomach/Bowel: Stomach is within normal limits. Appendix appears normal. No evidence of bowel wall thickening, distention, or inflammatory changes. No evidence of arterial extravasation contrast to suggest acute lower GI bleeding. There are a few scattered colonic diverticula. Lymphatic: No significant vascular findings are present. No enlarged abdominal or pelvic lymph nodes. Reproductive: Degenerated calcified uterine fibroids. No adnexal mass. Other: No abdominal wall hernia or abnormality. No abdominopelvic ascites. Musculoskeletal: Severe grade 2 anterolisthesis of L4 on L5 with advanced degenerative disc disease. Chronic compression fracture of the superior endplate of L3  with approximately 45% height loss. No acute fracture or malalignment. IMPRESSION: 1. No evidence of active bleeding at the time of imaging. 2. Scattered atherosclerotic vascular calcifications without aneurysm, dissection or other acute vascular abnormality. 3. Marked cardiomegaly with surgical changes of prior aortic, mitral and tricuspid valve replacement. 4. 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 5. Cholelithiasis without evidence of acute cholecystitis. 6. Scattered colonic diverticula without evidence of active bleeding or diverticulitis. 7. Degenerated calcified uterine fibroids. 8. Severe grade 2 anterolisthesis of L4 on L5 with advanced associated degenerative disc disease. 9. Chronic appearing compression fracture of the superior endplate of L3 with 45% height loss. Electronically Signed   By: Malachy Moan M.D.   On: 04/17/2023 15:41      Data Reviewed: Relevant notes from primary care and specialist visits, past discharge summaries as available in EHR, including Care Everywhere. Prior diagnostic testing as pertinent to current admission diagnoses, Updated medications and problem lists for reconciliation ED course, including vitals, labs, imaging, treatment and response to treatment,Triage notes, nursing and pharmacy notes and ED provider's notes Notable results as noted in HPI.Discussed case with EDMD/ ED APP/ or Specialty MD on call and as needed.  Assessment & Plan GIB (gastrointestinal bleeding) Patient presenting with rectal bleeding with clots.  Stat CT angio study done shows scattered colonic diverticula without evidence of active bleeding or diverticulitis.  CBC shows hemoglobin of 10.3 platelets of 134 white count of 6.0.  Will repeat and follow H&H every 4 hours x 2 occurrences.  GI consult.  Will hold Eliquis . Type and screen, IV PPI although I suspect is a lower GI bleed.H/H.  Essential (primary) hypertension Vitals:   04/17/23 1352 04/17/23 1400 04/17/23 1415 04/17/23 1430  BP: (!) 143/78 126/76 128/80 124/68   04/17/23 1445 04/17/23 1500 04/17/23 1515 04/17/23 1730  BP: 123/71 104/77 121/69 105/70   04/17/23 1745 04/17/23 1800  BP: 115/61 102/66  Home medications show patient is on Lasix which we will hold as blood pressures are low normal  Gastroesophageal reflux disease iv ppi. Aspiration precaution.   Chronic heart failure with preserved  ejection fraction Stable.  Patient is euvolemic. Daily weights and strict I's and O's.  Chronic kidney disease (CKD), active medical management without dialysis, stage 3 (moderate) Lab Results  Component Value Date   CREATININE 1.02 (H) 04/17/2023   CREATININE 1.15 (H) 12/07/2022   CREATININE 1.14 (H) 11/10/2022  Avoid contrast and renally dose and needed medications. Chronic a-fib (HCC) Currently in A-fib with rate control.  Eliquis held. D/W Pt and daughter at bedside. Seizure (HCC) Seizure, aspiration precautions and continue patient's Keppra. ABLA (acute blood loss anemia)    Latest Ref Rng & Units 04/17/2023    2:05 PM 12/07/2022    2:32 PM 11/11/2022   12:49 PM  CBC  WBC 4.0 - 10.5 K/uL 6.0  6.2    Hemoglobin 12.0 - 15.0 g/dL 10.2  72.5  8.3   Hematocrit 36.0 - 46.0 % 32.0  33.3  25.4   Platelets 150 - 400 K/uL 134  167    Will type and screen, IV PPI and transfuse as deemed appropriate.  AICD (automatic cardioverter/defibrillator) present Cardiology consult as deemed appropriate.  Currently patient is stable no reports of palpitation or shocks.   DVT prophylaxis:  SCD's Consults:  GI: [facility]  . Advance Care Planning:    Code Status: Full Code   Family Communication:  None  Disposition Plan:  Home  Severity of Illness:  The appropriate patient status for this patient is OBSERVATION. Observation status is judged to be reasonable and necessary in order to provide the required intensity of service to ensure the patient's safety. The patient's presenting symptoms, physical exam findings, and initial radiographic and laboratory data in the context of their medical condition is felt to place them at decreased risk for further clinical deterioration. Furthermore, it is anticipated that the patient will be medically stable for discharge from the hospital within 2 midnights of admission.   Author: Gertha Calkin, MD 04/17/2023 6:40 PM  For on call review  [email or web address].   Unresulted Labs (From admission, onward)     Start     Ordered   04/17/23 1832  Hemoglobin and hematocrit, blood  Now then every 4 hours,   R      04/17/23 1831   04/17/23 1815  Type and screen  Once,   R        04/17/23 1830   04/17/23 1419  Lactic acid, plasma  (Lactic Acid)  Now then every 2 hours,   R      04/17/23 1418            Orders Placed This Encounter  Procedures   CT Angio Abd/Pel W and/or Wo Contrast   Comprehensive metabolic panel   CBC   Lactic acid, plasma   Hemoglobin and hematocrit, blood   Diet clear liquid Room service appropriate? Yes; Fluid consistency: Thin   Initiate Carrier Fluid Protocol   SCDs   Cardiac Monitoring Continuous x 24 hours Indications for use: Other; other indications for use: Monitor for ischemia   Vital signs   Notify physician (specify)   Mobility Protocol: No Restrictions RN to initiate protocols based on patient's level of care   Refer to Sidebar Report Refer to ICU, Med-Surg, Progressive, and Step-Down Mobility Protocol Sidebars   Initiate Adult Central Line Maintenance and Catheter Protocol for patients with central line (CVC, PICC, Port, Hemodialysis, Trialysis)   If patient diabetic or glucose greater than 140 notify physician for Sliding Scale Insulin Orders   Intake and Output   Do not place and if present remove PureWick   Initiate Oral Care Protocol   RN may order General Admission PRN Orders utilizing "General Admission PRN medications" (through manage orders) for the following patient needs: allergy symptoms (Claritin), cold sores (Carmex), cough (Robitussin DM), eye irritation (Liquifilm Tears), hemorrhoids (Tucks), indigestion (Maalox), minor skin irritation (Hydrocortisone Cream), muscle pain Romeo Apple Gay), nose irritation (saline nasal spray) and sore throat (Chloraseptic spray).   Full code   Consult to hospitalist   Pulse oximetry check with vital signs   Oxygen therapy Mode or (Route): Nasal cannula;  Liters Per Minute: 2; Keep O2 saturation between: greater than 92 %   POC occult blood, ED   Type and screen [facility]   Type and screen   Place in observation (patient's expected length of stay will be less than 2 midnights)   Aspiration precautions   Fall precautions

## 2023-04-17 NOTE — Assessment & Plan Note (Addendum)
 Seizure, aspiration precautions and continue patient's Keppra.

## 2023-04-17 NOTE — ED Triage Notes (Signed)
 Per EMS, Pt, from home, c/o nausea and rectal bleeding w/ clots x 3 episodes today.  Denies pain.  Hx of GI bleeds and diverticulitis. Pt is on Eliquis.

## 2023-04-17 NOTE — ED Notes (Signed)
 Patient transported to CT

## 2023-04-17 NOTE — Assessment & Plan Note (Addendum)
 iv ppi. Aspiration precaution.

## 2023-04-17 NOTE — Assessment & Plan Note (Addendum)
 Patient presenting with rectal bleeding with clots.  Stat CT angio study done shows scattered colonic diverticula without evidence of active bleeding or diverticulitis.  CBC shows hemoglobin of 10.3 platelets of 134 white count of 6.0.  Will repeat and follow H&H every 4 hours x 2 occurrences.  GI consult.  Will hold Eliquis . Type and screen, IV PPI although I suspect is a lower GI bleed.H/H.

## 2023-04-17 NOTE — Assessment & Plan Note (Addendum)
 Vitals:   04/17/23 1352 04/17/23 1400 04/17/23 1415 04/17/23 1430  BP: (!) 143/78 126/76 128/80 124/68   04/17/23 1445 04/17/23 1500 04/17/23 1515 04/17/23 1730  BP: 123/71 104/77 121/69 105/70   04/17/23 1745 04/17/23 1800  BP: 115/61 102/66  Home medications show patient is on Lasix which we will hold as blood pressures are low normal

## 2023-04-17 NOTE — Progress Notes (Signed)
 Patient arrived on the unit, needed to use the bathroom, patient noted with a medium amount of clots in the pull up patient had on, also a few clots in the urine. Patient appears asymptomatic. VS obtained. Will continue to monitor this shift.

## 2023-04-17 NOTE — Assessment & Plan Note (Addendum)
 Cardiology consult as deemed appropriate.  Currently patient is stable no reports of palpitation or shocks.

## 2023-04-17 NOTE — ED Provider Notes (Signed)
 [facility] EMERGENCY DEPARTMENT AT [facility] Provider Note   CSN: [id number] Arrival date & time: 04/17/23  1346     History  Chief Complaint  Patient presents with   Rectal Bleeding   Nausea    Lindsey Huber is a 78 y.o. female.  78 year old female with past medical history of CHF, SVT, and atrial fibrillation presents emergency department today with rectal bleeding.  The patient is on Eliquis.  She states that over the past few days she has had some fatigue but denies any chest pain or lightheadedness.  The patient did have some nausea this morning but denies any vomiting.  She reports she did have 3 episodes of bright red blood per rectum prior to coming in today which is what prompted her to come into the ER.  She was seen here a few months ago for rectal bleeding and was once consistent with diverticular bleed.   Rectal Bleeding      Home Medications Prior to Admission medications   Medication Sig Start Date End Date Taking? Authorizing Provider  acetaminophen (TYLENOL) 500 MG tablet Take 1,000 mg by mouth every 6 (six) hours as needed (pain).    [provider]  albuterol (VENTOLIN HFA) 108 (90 Base) MCG/ACT inhaler Inhale 2 puffs into the lungs every 6 (six) hours as needed for wheezing or shortness of breath. 03/21/23   Hunsucker, Lesia Sago, MD  allopurinol (ZYLOPRIM) 100 MG tablet Take 1 tablet (100 mg total) by mouth daily. 01/05/23   Rice, Jamesetta Orleans, MD  amoxicillin (AMOXIL) 500 MG capsule 4 capsules by mouth 1 hour before dental procedure. 12/30/21   [provider]  apixaban (ELIQUIS) 2.5 MG TABS tablet Take 1 tablet (2.5 mg total) by mouth 2 (two) times daily. 12/17/22 06/15/23  Lanier Prude, MD  Budeson-Glycopyrrol-Formoterol (BREZTRI AEROSPHERE) 160-9-4.8 MCG/ACT AERO Inhale 2 puffs into the lungs 2 (two) times daily as needed ("for flares"). 03/21/23   Hunsucker, Lesia Sago, MD  calcium-vitamin D (OSCAL WITH D) 500-5 MG-MCG  tablet Take 2 tablets by mouth 2 (two) times daily. 07/10/22   Elgergawy, Leana Roe, MD  cetirizine HCl (ZYRTEC) 5 MG/5ML SOLN Take 1 mL by mouth 2 (two) times daily as needed for rhinitis or allergies.    [provider]  Cholecalciferol (VITAMIN D3) 25 MCG (1000 UT) CAPS Take 1,000 Units by mouth daily with lunch.    [provider]  fluticasone (FLONASE) 50 MCG/ACT nasal spray PLACE 1 SPRAY INTO BOTH NOSTRILS 2 (TWO) TIMES DAILY Patient not taking: Reported on 03/21/2023 05/11/22   Hunsucker, Lesia Sago, MD  furosemide (LASIX) 40 MG tablet Take 1 tablet (40 mg total) by mouth daily. 07/30/22   Jacklynn Ganong, FNP  Glycerin-Hypromellose-PEG 400 (DRY EYE RELIEF DROPS OP) Place 1 drop into both eyes daily as needed (for dry eye).    [provider]  hydroxychloroquine (PLAQUENIL) 200 MG tablet Take 1 tablet (200 mg total) by mouth daily. 01/05/23   Fuller Plan, MD  levETIRAcetam (KEPPRA) 500 MG tablet Take 1 tablet (500 mg total) by mouth 2 (two) times daily. 10/27/22   Pokhrel, Rebekah Chesterfield, MD  magnesium oxide (MAG-OX) 400 (240 Mg) MG tablet TAKE 1 TABLET BY MOUTH EVERY DAY 08/25/22   Lanier Prude, MD  Nintedanib (OFEV) 150 MG CAPS Take 1 capsule (150 mg total) by mouth at bedtime. 12/13/22   Hunsucker, Lesia Sago, MD  OLANZapine (ZYPREXA) 2.5 MG tablet TAKE 1 TABLET BY MOUTH EVERYDAY AT BEDTIME  01/20/23   Everlena Cooper, Adam R, DO  pantoprazole (PROTONIX) 40 MG tablet Take 1 tablet (40 mg total) by mouth 2 (two) times daily. 11/11/22 11/11/23  Regalado, Belkys A, MD  polyethylene glycol powder (MIRALAX) 17 GM/SCOOP powder Take 17 g by mouth daily as needed for mild constipation. 07/03/21   [provider]  potassium chloride (KLOR-CON M10) 10 MEQ tablet TAKE 2 TABLETS BY MOUTH EVERY DAY 03/23/23   Sheilah Pigeon, PA-C  predniSONE (DELTASONE) 5 MG tablet Take 1 tablet (5 mg total) by mouth daily with breakfast. Patient to double up dose during sick day rule 11/30/22    Shamleffer, Konrad Dolores, MD      Allergies    Other, Propofol, Flecainide, Amiodarone, and Amlodipine    Review of Systems   Review of Systems  Constitutional:  Positive for fatigue.  Gastrointestinal:  Positive for anal bleeding and hematochezia.  All other systems reviewed and are negative.   Physical Exam Updated Vital Signs BP 128/80   Pulse 72   Temp 97.6 F (36.4 C) (Oral)   Resp 19   Ht 5\' 3"  (1.6 m)   Wt 54 kg   SpO2 100%   BMI 21.08 kg/m  Physical Exam Vitals and nursing note reviewed.   Gen: NAD, pale appearing Eyes: PERRL, EOMI HEENT: no oropharyngeal swelling Neck: trachea midline Resp: clear to auscultation bilaterally Card: RRR, no murmurs, rubs, or gallops Abd: tender over the LLQ with no guarding or rebound Rectal: bright red blood noted, no external bleeding Extremities: no calf tenderness, no edema Vascular: 2+ radial pulses bilaterally, 2+ DP pulses bilaterally Skin: no rashes Psyc: acting appropriately   ED Results / Procedures / Treatments   Labs (all labs ordered are listed, but only abnormal results are displayed) Labs Reviewed  CBC - Abnormal; Notable for the following components:      Result Value   RBC 3.19 (*)    Hemoglobin 10.3 (*)    HCT 32.0 (*)    MCV 100.3 (*)    Platelets 134 (*)    All other components within normal limits  COMPREHENSIVE METABOLIC PANEL  LACTIC ACID, PLASMA  LACTIC ACID, PLASMA  POC OCCULT BLOOD, ED  TYPE AND SCREEN    EKG None  Radiology No results found.  Procedures Procedures    Medications Ordered in ED Medications  ondansetron (ZOFRAN) injection 4 mg (4 mg Intravenous Given 04/17/23 1456)    ED Course/ Medical Decision Making/ A&P                                 Medical Decision Making 78 year old female with past medical history of CHF and hypertension presenting to the emergency department today with rectal bleeding.  I will further evaluate the patient here with basic labs to  eval for anemia or electrolyte abnormalities.  I will obtain a CT angiogram to evaluate for active bleeding as well as to further evaluate the patient's abdominal pain as she did report severe left-sided abdominal pain when this first started.  Will also obtain a lactic acid to screen for mesenteric ischemia.  Will obtain a type and screen in case the patient does require transfusion.  She will require admission.  The patient's initial hemoglobin here is stable.  CT pending at the time of signout.  Plan is for admission after imaging/reevaluation.  Amount and/or Complexity of Data Reviewed Labs: ordered. Radiology: ordered.  Risk Prescription drug  management.           Final Clinical Impression(s) / ED Diagnoses Final diagnoses:  Rectal bleeding    Rx / DC Orders ED Discharge Orders     None         Durwin Glaze, MD 04/17/23 [phone]

## 2023-04-17 NOTE — Progress Notes (Addendum)
 [facility] night cross cover note:   I was notified by pt's RN that the patient has now arrived on 2W from the ED. Patient noted to have some blood clots in her pull-up. Patient without acute complaint at this time. Most recent VS notable for the following: Afebrile; heart rates in the 60s to 70s, blood pressure 111/54, respiratory rate 18, and oxygen saturation 100% on room air.  Per my brief chart review, this is a 78 year old female who is being admitted with suspected acute upper gastrointestinal bleed Eliquis as an outpatient.  Plan is noted to be as follows: q4h H&H monitoring, with next H&H check due around midnight, v protonix bid, and [facility] to formally consult. Holding home eliquis. Active type and screen is noted.   Update: most recent H&H (drawn at 2247) is noted to be 10.2, up from most recent prior hgb level of 9.1 at 1932 this evening. Will continue to trend q4h H&H's, as above.    Newton Pigg, DO Hospitalist

## 2023-04-17 NOTE — Assessment & Plan Note (Addendum)
 Currently in A-fib with rate control.  Eliquis held. D/W Pt and daughter at bedside.

## 2023-04-17 NOTE — Progress Notes (Signed)
 [facility] night cross cover note:   Level of care for this patient being admitted for acute upper gi bleed has been changed from cardiac-tele to med-tele.     Lindsey Pigg, DO Hospitalist

## 2023-04-17 NOTE — Assessment & Plan Note (Addendum)
 Stable.  Patient is euvolemic. Daily weights and strict I's and O's.

## 2023-04-17 NOTE — ED Provider Notes (Signed)
 Patient was initially seen by Dr. Karie Schwalbe.  Please see his note.  Plan was follow-up on the CT scan.  No signs of any active bleeding at this time.  Case discussed with hospitalist service regarding admission.  Secure chat message sent to [facility] , Dr Georgiann Cocker for consultation while the patient is in the hospital   Linwood Dibbles, MD 04/17/23 1756

## 2023-04-18 ENCOUNTER — Other Ambulatory Visit: Payer: Self-pay | Admitting: Physician Assistant

## 2023-04-18 DIAGNOSIS — K5791 Diverticulosis of intestine, part unspecified, without perforation or abscess with bleeding: Secondary | ICD-10-CM | POA: Diagnosis not present

## 2023-04-18 LAB — CBC
HCT: 30.8 % — ABNORMAL LOW (ref 36.0–46.0)
Hemoglobin: 9.9 g/dL — ABNORMAL LOW (ref 12.0–15.0)
MCH: 32.4 pg (ref 26.0–34.0)
MCHC: 32.1 g/dL (ref 30.0–36.0)
MCV: 100.7 fL — ABNORMAL HIGH (ref 80.0–100.0)
Platelets: 136 10*3/uL — ABNORMAL LOW (ref 150–400)
RBC: 3.06 MIL/uL — ABNORMAL LOW (ref 3.87–5.11)
RDW: 15.1 % (ref 11.5–15.5)
WBC: 6.3 10*3/uL (ref 4.0–10.5)
nRBC: 0 % (ref 0.0–0.2)

## 2023-04-18 LAB — BASIC METABOLIC PANEL
Anion gap: 9 (ref 5–15)
BUN: 13 mg/dL (ref 8–23)
CO2: 27 mmol/L (ref 22–32)
Calcium: 9 mg/dL (ref 8.9–10.3)
Chloride: 102 mmol/L (ref 98–111)
Creatinine, Ser: 0.95 mg/dL (ref 0.44–1.00)
GFR, Estimated: >60 mL/min (ref >60)
Glucose, Bld: 77 mg/dL (ref 70–99)
Potassium: 3.9 mmol/L (ref 3.5–5.1)
Sodium: 138 mmol/L (ref 135–145)

## 2023-04-18 MED ORDER — PREDNISONE 10 MG PO TABS
5.0000 mg | ORAL_TABLET | Freq: Every day | ORAL | Status: DC
  Administered 2023-04-19 – 2023-04-20 (×2): 5 mg via ORAL
  Filled 2023-04-18 (×2): qty 1

## 2023-04-18 NOTE — Progress Notes (Addendum)
 Progress Note   Patient: Lindsey Huber ZOX:[id number] DOB: May 31, 1945 DOA: 04/17/2023     0 DOS: the patient was seen and examined on 04/18/2023   Brief hospital course:  Lindsey Huber is a 78 y.o. female with past medical history  of A-fib on Xarelto, SSS/PPM, ILD/pulmonary fibrosis/lupus followed by [name], COPD, diastolic CHF, OSA, aortic, mitral, tricuspid valve disease s/p AVR/MV ring/TVR, CKD 3 A presented to the ER for evaluation of rectal bleeding.  Prior history of rectal bleeding and GIB history in the  past when she has needed Kcentra, Vit K and vasopressors therapy, at that time patient's bleeding was attributed to diverticulitis. Pt states that her eliquis was resumed because of her valvular and heart history.       Assessment and Plan:  Rectal bleed concerning for diverticular bleed rule out ischemic colitis Acute blood loss anemia Patient presents to the ER for evaluation of rectal bleeding in the setting of chronic anticoagulation for A-fib CT angiogram showed no evidence of active bleeding at the time of imaging and scattered colonic diverticula Continue to hold Eliquis No further episodes since admission and H&H is stable Appreciate GI input, continue to monitor serial H&H and continue clear liquid diet      Atrial fibrillation (chronic) Rate controlled Eliquis is on hold due to acute blood loss anemia from rectal bleeding    Chronic diastolic dysfunction CHF Status post AICD placement Stable and not acutely exacerbated Last known LVEF of 60 to 65% with moderate LVH Lasix is on hold due to volume depletion from bleeding     Hypertension Blood pressure is stable    Seizure disorder Continue Keppra     SLE with related ILD, OA, and gouty arthritis  Continue hydroxychloroquine 200 mg daily and prednisone 5 mg daily.     Subjective: No further bleeding episodes.  Denies having abdominal pain  Physical Exam: Vitals:   04/18/23 0013  04/18/23 0452 04/18/23 0756 04/18/23 0835  BP: 114/61 118/61 129/68   Pulse: 68 70 (!) 59   Resp: 18 18 16    Temp: 98.2 F (36.8 C) 97.8 F (36.6 C)    TempSrc: Oral Oral    SpO2: 100% 98% 99% 98%  Weight:      Height:      Physical Exam Vitals and nursing note reviewed.  Constitutional:      Appearance: Normal appearance.     Comments: Frail  HENT:     Head: Normocephalic and atraumatic.     Nose: Nose normal.     Mouth/Throat:     Mouth: Mucous membranes are moist.  Eyes:     Comments: Pale conjunctiva  Cardiovascular:     Rate and Rhythm: Regular rhythm.  Pulmonary:     Effort: Pulmonary effort is normal.     Breath sounds: Normal breath sounds.  Abdominal:     General: Abdomen is flat. Bowel sounds are normal.     Palpations: Abdomen is soft.  Musculoskeletal:        General: Normal range of motion.     Cervical back: Normal range of motion and neck supple.  Skin:    General: Skin is warm and dry.  Neurological:     Mental Status: She is alert and oriented to person, place, and time.  Psychiatric:        Mood and Affect: Mood normal.        Behavior: Behavior normal.     Data Reviewed: Hemoglobin 9.9, lactic acid 1.9  Las reviewed  Family Communication: Plan of care was discussed with patient at the bedside.  She verbalizes understanding and agrees with the plan.  Disposition: Status is: Observation The patient remains OBS appropriate and will d/c before 2 midnights.  Planned Discharge Destination: Home    Time spent: 33 minutes  Author: Lucile Shutters, MD 04/18/2023 12:06 PM  For on call review [email or web address].

## 2023-04-18 NOTE — Plan of Care (Signed)
  Problem: Education: Goal: Knowledge of General Education information will improve Description: Including pain rating scale, medication(s)/side effects and non-pharmacologic comfort measures Outcome: Progressing   Problem: Health Behavior/Discharge Planning: Goal: Ability to manage health-related needs will improve Outcome: Progressing   Problem: Clinical Measurements: Goal: Ability to maintain clinical measurements within normal limits will improve Outcome: Progressing Goal: Will remain free from infection Outcome: Progressing Goal: Diagnostic test results will improve Outcome: Progressing Goal: Respiratory complications will improve Outcome: Progressing Goal: Cardiovascular complication will be avoided Outcome: Progressing   Problem: Activity: Goal: Risk for activity intolerance will decrease Outcome: Progressing   Problem: Nutrition: Goal: Adequate nutrition will be maintained Outcome: Progressing   Problem: Coping: Goal: Level of anxiety will decrease Outcome: Progressing   Problem: Elimination: Goal: Will not experience complications related to bowel motility Outcome: Progressing Goal: Will not experience complications related to urinary retention Outcome: Progressing   Problem: Pain Managment: Goal: General experience of comfort will improve and/or be controlled Outcome: Progressing   Problem: Safety: Goal: Ability to remain free from injury will improve Outcome: Progressing   Problem: Skin Integrity: Goal: Risk for impaired skin integrity will decrease Outcome: Progressing   Problem: Education: Goal: Ability to identify signs and symptoms of gastrointestinal bleeding will improve Outcome: Progressing   Problem: Bowel/Gastric: Goal: Will show no signs and symptoms of gastrointestinal bleeding Outcome: Progressing   Problem: Fluid Volume: Goal: Will show no signs and symptoms of excessive bleeding Outcome: Progressing   Problem: Clinical  Measurements: Goal: Complications related to the disease process, condition or treatment will be avoided or minimized Outcome: Progressing

## 2023-04-18 NOTE — Consult Note (Signed)
 Referring Provider: Dr. Joylene Igo Primary Care Physician:  Collene Mares, [name] Primary Gastroenterologist:  Dr. Dulce Sellar  Reason for Consultation:  Rectal bleed  HPI: Lindsey Huber is a 78 y.o. female with acute onset of red blood and clots with stool yesterday X 4 and once since admit. Reports lower abdominal pain (pointing to RLQ and LLQ) that is crampy. Associated nausea without vomiting. Denies black stools. Colonoscopy (11/22) showed left-sided diverticulosis and hemorrhoids. Hgb 9.9. On Eliquis at outpt (last dose yesterday and on hold now). CT angio negative for active bleeding yesterday.  Past Medical History:  Diagnosis Date   Achalasia    Acquired hypothyroidism 08/20/2020   Acute metabolic encephalopathy 07/25/2021   AICD (automatic cardioverter/defibrillator) present 11/14/2019   Allergic rhinitis    Ankle fracture 03/25/2022   Aortic valve disorder 03/26/2002   Atherosclerosis of abdominal aorta (HCC) 05/01/2020   Cardiomyopathy (HCC)    CHB (complete heart block) (HCC) 08/18/2017   CHF (congestive heart failure) (HCC)    Cholelithiasis without obstruction 05/01/2020   Chronic gouty arthritis    Chronic heart failure with preserved ejection fraction (HCC) 05/01/2020   Chronic kidney disease (CKD), active medical management without dialysis, stage 3 (moderate) (HCC) 05/01/2020   Closed torus fracture of distal end of right radius with delayed healing 08/12/2021   Delusional thoughts (HCC)    Diverticulosis of colon 05/01/2020   Epistaxis    Essential (primary) hypertension 08/18/2017   Gastroesophageal reflux disease 05/01/2020   Gastrointestinal hemorrhage    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 11/14/2019   Hypercoagulability due to atrial fibrillation (HCC) 05/01/2020   Hyperlipidemia 11/14/2019   Hypocalcemia 12/14/2020   Hypoglycemia    Hypokalemia 12/14/2020   Hypomagnesemia 12/14/2020   Hypotension 12/14/2020   Idiopathic  pulmonary fibrosis (HCC) 05/01/2020   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   Iron deficiency anemia    Long term (current) use of anticoagulants    Malnutrition of mild degree Lindsey Huber: 75% to less than 90% of standard weight) (HCC)    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/12/2021   Mitral and aortic incompetence 11/14/2019   Non-rheumatic atrial fibrillation (HCC) 05/01/2020   Non-toxic multinodular goiter 08/20/2020   NSVT (nonsustained ventricular tachycardia) (HCC) 08/18/2017   Oropharyngeal dysphagia    Osteoarthritis of hip 05/01/2020   Osteoarthritis of knee 05/01/2020   Osteopenia of neck of left femur    Pain of left hip joint 12/12/2017   Proteinuria 05/01/2020   Raynaud's disease    Recurrent falls 04/09/2021   Rheumatoid arthritis (HCC)    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 ENDOSCOPY;  Service: Endoscopy;  Laterality: N/A;   HEMORRHOID SURGERY     PACEMAKER INSERTION     RIGHT/LEFT HEART CATH AND CORONARY ANGIOGRAPHY N/A 11/27/2020   Procedure: RIGHT/LEFT HEART CATH AND CORONARY ANGIOGRAPHY;  Surgeon: Dolores Patty, MD;  Location: [facility] INVASIVE CV LAB;  Service: Cardiovascular;  Laterality:  N/A;   TONSILLECTOMY      Prior to Admission medications   Medication Sig Start Date End Date Taking? Authorizing Provider  acetaminophen (TYLENOL) 500 MG tablet Take 1,000 mg by mouth every 6 (six) hours as needed (pain).   Yes [provider]  albuterol (VENTOLIN HFA) 108 (90 Base) MCG/ACT inhaler Inhale 2 puffs into the lungs every 6 (six) hours as needed for wheezing or shortness of breath. 03/21/23  Yes Hunsucker, Lesia Sago, MD  allopurinol (ZYLOPRIM) 100 MG tablet Take 1 tablet (100 mg total) by mouth daily. 01/05/23  Yes Rice, Jamesetta Orleans, MD  amoxicillin (AMOXIL) 500 MG capsule 4 capsules by mouth 1 hour before dental procedure. 12/30/21  Yes [provider]  apixaban (ELIQUIS) 2.5 MG TABS tablet Take 1 tablet (2.5 mg total) by mouth 2 (two) times daily. 12/17/22 06/15/23 Yes Lanier Prude, MD  Budeson-Glycopyrrol-Formoterol (BREZTRI AEROSPHERE) 160-9-4.8 MCG/ACT AERO Inhale 2 puffs into the lungs 2 (two) times daily as needed ("for flares"). Patient taking differently: Inhale 2 puffs into the lungs daily as needed ("for flares"). 03/21/23  Yes Hunsucker, Lesia Sago, MD  calcium-vitamin D (OSCAL WITH D) 500-5 MG-MCG tablet Take 2 tablets by mouth 2 (two) times daily. Patient taking differently: Take 1 tablet by mouth daily. 07/10/22  Yes Elgergawy, Leana Roe, MD  Cholecalciferol (VITAMIN D3) 25 MCG (1000 UT) CAPS Take 1,000 Units by mouth daily with lunch.   Yes [provider]  fluticasone (FLONASE) 50 MCG/ACT nasal spray PLACE 1 SPRAY INTO BOTH NOSTRILS 2 (TWO) TIMES DAILY Patient taking differently: Place 2 sprays into both nostrils daily. 05/11/22  Yes Hunsucker, Lesia Sago, MD  furosemide (LASIX) 40 MG tablet Take 1 tablet (40 mg total) by mouth daily. 07/30/22  Yes Milford, Anderson Malta, FNP  hydroxychloroquine (PLAQUENIL) 200 MG tablet Take 1 tablet (200 mg total) by mouth daily. 01/05/23  Yes Rice, Jamesetta Orleans, MD  levETIRAcetam (KEPPRA) 500 MG tablet Take 1 tablet (500 mg total) by mouth 2 (two) times daily. 10/27/22  Yes Pokhrel, Laxman, MD  magnesium oxide (MAG-OX) 400 (240 Mg) MG tablet TAKE 1 TABLET BY MOUTH EVERY DAY 08/25/22  Yes Lanier Prude, MD  Nintedanib (OFEV) 150 MG CAPS Take 1  capsule (150 mg total) by mouth at bedtime. 12/13/22  Yes Hunsucker, Lesia Sago, MD  OLANZapine (ZYPREXA) 2.5 MG tablet TAKE 1 TABLET BY MOUTH EVERYDAY AT BEDTIME 01/20/23  Yes Jaffe, Adam R, DO  pantoprazole (PROTONIX) 40 MG tablet Take 1 tablet (40 mg total) by mouth 2 (two) times daily. Patient taking differently: Take 40 mg by mouth daily. 11/11/22 11/11/23 Yes Regalado, Belkys A, MD  polyethylene glycol powder (MIRALAX) 17 GM/SCOOP powder Take 17 g by mouth daily as needed for mild constipation. 07/03/21  Yes [provider]  potassium chloride (KLOR-CON M10) 10 MEQ tablet TAKE 2 TABLETS BY MOUTH EVERY DAY 03/23/23  Yes Sheilah Pigeon, PA-C  predniSONE (DELTASONE) 5 MG tablet Take 1 tablet (5 mg total) by mouth daily with breakfast. Patient to double up dose during sick day rule 11/30/22  Yes Shamleffer, Konrad Dolores, MD    Scheduled Meds:  calcium-vitamin D  2 tablet Oral BID   cholecalciferol  1,000 Units Oral Q lunch   fluticasone  1 spray Each Nare Daily   fluticasone furoate-vilanterol  1 puff Inhalation Daily   And   umeclidinium bromide  1 puff Inhalation Daily   hydroxychloroquine  200 mg Oral Daily   levETIRAcetam  500  mg Oral BID   magnesium oxide  400 mg Oral Daily   pantoprazole (PROTONIX) IV  40 mg Intravenous Q12H   sodium chloride flush  3-10 mL Intravenous Q12H   Continuous Infusions: PRN Meds:.acetaminophen, albuterol, hydrALAZINE, loratadine, polyethylene glycol, sodium chloride flush  Allergies as of 04/17/2023 - Review Complete 04/17/2023  Allergen Reaction Noted   Other Other (See Comments) 12/26/2021   Propofol Shortness Of Breath and Other (See Comments) 02/06/2007   Flecainide Other (See Comments) 06/19/2020   Amiodarone Other (See Comments) 09/03/2015   Amlodipine Other (See Comments) 09/01/2016    Family History  Problem Relation Age of Onset   Heart disease Mother    Hypertension Mother    Rheum arthritis Mother    Osteoarthritis Mother     Heart disease Father    Hypertension Father    Osteoarthritis Father    Heart disease Sister    Diabetes Brother    Cancer Brother     Social History   Socioeconomic History   Marital status: Legally Separated    Spouse name: Not on file   Number of children: Not on file   Years of education: 16   Highest education level: Bachelor's degree (e.g., BA, AB, BS)  Occupational History   Occupation: Retired    Comment: Community education officer business  Tobacco Use   Smoking status: Never    Passive exposure: Past   Smokeless tobacco: Never  Vaping Use   Vaping status: Never Used  Substance and Sexual Activity   Alcohol use: Never   Drug use: Never   Sexual activity: Not Currently  Other Topics Concern   Not on file  Social History Narrative   Right Handed    Social Drivers of Health   Financial Resource Strain: Not on file  Food Insecurity: No Food Insecurity (04/17/2023)   Hunger Vital Sign    Worried About Running Out of Food in the Last Year: Never true    Ran Out of Food in the Last Year: Never true  Transportation Needs: No Transportation Needs (04/17/2023)   PRAPARE - Administrator, Civil Service (Medical): No    Lack of Transportation (Non-Medical): No  Physical Activity: Not on file  Stress: Not on file  Social Connections: Moderately Isolated (04/17/2023)   Social Connection and Isolation Panel [NHANES]    Frequency of Communication with Friends and Family: More than three times a week    Frequency of Social Gatherings with Friends and Family: Three times a week    Attends Religious Services: More than 4 times per year    Active Member of Clubs or Organizations: No    Attends Banker Meetings: Never    Marital Status: Separated  Intimate Partner Violence: Not At Risk (04/17/2023)   Humiliation, Afraid, Rape, and Kick questionnaire    Fear of Current or Ex-Partner: No    Emotionally Abused: No    Physically Abused: No    Sexually Abused: No     Review of Systems: All negative except as stated above in HPI.  Physical Exam: Vital signs: Vitals:   04/18/23 0756 04/18/23 0835  BP: 129/68   Pulse: (!) 59   Resp: 16   Temp:    SpO2: 99% 98%  T 97.8  Last BM Date : 04/17/23 General:   lethargic, elderly, thin, no acute distress  Head: normocephalic, atraumatic Eyes: anicteric sclera ENT: oropharynx clear Neck: supple, nontender Lungs:  Clear throughout to auscultation.   No wheezes, crackles,  or rhonchi. No acute distress. Heart:  Regular rate and rhythm; no murmurs, clicks, rubs,  or gallops. Abdomen: soft, nontender, nondistended, +BS  Rectal:  Deferred Ext: no edema  GI:  Lab Results: Recent Labs    04/17/23 1405 04/17/23 1932 04/17/23 2247 04/18/23 0846  WBC 6.0  --   --  6.3  HGB 10.3* 9.1* 10.2* 9.9*  HCT 32.0* 28.6* 31.9* 30.8*  PLT 134*  --   --  136*   BMET Recent Labs    04/17/23 1405 04/18/23 0846  NA 138 138  K 3.7 3.9  CL 101 102  CO2 26 27  GLUCOSE 74 77  BUN 20 13  CREATININE 1.02* 0.95  CALCIUM 9.1 9.0   LFT Recent Labs    04/17/23 1405  PROT 6.9  ALBUMIN 3.2*  AST 31  ALT 13  ALKPHOS 33*  BILITOT 0.8   PT/INR No results for input(s): "LABPROT", "INR" in the last 72 hours.   Studies/Results: CT Angio Abd/Pel W and/or Wo Contrast Result Date: 04/17/2023 CLINICAL DATA:  Rectal bleeding.  Possible acute lower GI bleed. EXAM: CTA ABDOMEN AND PELVIS WITHOUT AND WITH CONTRAST TECHNIQUE: Multidetector CT imaging of the abdomen and pelvis was performed using the standard protocol during bolus administration of intravenous contrast. Multiplanar reconstructed images and MIPs were obtained and reviewed to evaluate the vascular anatomy. RADIATION DOSE REDUCTION: This exam was performed according to the departmental dose-optimization program which includes automated exposure control, adjustment of the mA and/or kV according to patient size and/or use of iterative reconstruction  technique. CONTRAST:  75mL OMNIPAQUE IOHEXOL 350 MG/ML SOLN COMPARISON:  None Available. FINDINGS: VASCULAR Aorta: Normal caliber aorta without aneurysm, dissection, vasculitis or significant stenosis. Scattered atherosclerotic vascular calcifications. Celiac: Patent without evidence of aneurysm, dissection, vasculitis or significant stenosis. SMA: Patent without evidence of aneurysm, dissection, vasculitis or significant stenosis. Renals: Renal arteries are patent without evidence of aneurysm, dissection, vasculitis, fibromuscular dysplasia or significant stenosis. Small accessory left renal artery. IMA: Patent without evidence of aneurysm, dissection, vasculitis or significant stenosis. Inflow: Patent without evidence of aneurysm, dissection, vasculitis or significant stenosis. Proximal Outflow: Bilateral common femoral and visualized portions of the superficial and profunda femoral arteries are patent without evidence of aneurysm, dissection, vasculitis or significant stenosis. Veins: No focal venous abnormality. Review of the MIP images confirms the above findings. NON-VASCULAR Lower chest: Marked cardiomegaly. Partially imaged cardiac rhythm maintenance device with leads in the right atrium and right ventricle. Severe biatrial enlargement. Evidence of prior aortic, mitral and tricuspid valve replacement. No pericardial effusion. Subpleural reticulation, architectural distortion and honeycombing in the lung bases consistent with usual interstitial pneumonitis. Hepatobiliary: Normal hepatic contour and morphology. No discrete hepatic lesion. High attenuation stones layer in the gallbladder lumen. No biliary ductal dilatation. Pancreas: Unremarkable. No pancreatic ductal dilatation or surrounding inflammatory changes. Spleen: No splenic injury or perisplenic hematoma. Adrenals/Urinary Tract: Adrenal glands are unremarkable. Kidneys are normal, without renal calculi, focal lesion, or hydronephrosis. Bladder is  unremarkable. Stomach/Bowel: Stomach is within normal limits. Appendix appears normal. No evidence of bowel wall thickening, distention, or inflammatory changes. No evidence of arterial extravasation contrast to suggest acute lower GI bleeding. There are a few scattered colonic diverticula. Lymphatic: No significant vascular findings are present. No enlarged abdominal or pelvic lymph nodes. Reproductive: Degenerated calcified uterine fibroids. No adnexal mass. Other: No abdominal wall hernia or abnormality. No abdominopelvic ascites. Musculoskeletal: Severe grade 2 anterolisthesis of L4 on L5 with advanced degenerative disc disease. Chronic compression fracture of  the superior endplate of L3 with approximately 45% height loss. No acute fracture or malalignment. IMPRESSION: 1. No evidence of active bleeding at the time of imaging. 2. Scattered atherosclerotic vascular calcifications without aneurysm, dissection or other acute vascular abnormality. 3. Marked cardiomegaly with surgical changes of prior aortic, mitral and tricuspid valve replacement. 4. 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 5. Cholelithiasis without evidence of acute cholecystitis. 6. Scattered colonic diverticula without evidence of active bleeding or diverticulitis. 7. Degenerated calcified uterine fibroids. 8. Severe grade 2 anterolisthesis of L4 on L5 with advanced associated degenerative disc disease. 9. Chronic appearing compression fracture of the superior endplate of L3 with 45% height loss. Electronically Signed   By: Malachy Moan M.D.   On: 04/17/2023 15:41    Impression/Plan: Rectal bleeding likely diverticular but ischemic colitis also in differential. Doubt colon malignancy with colonoscopy in 2022. Supportive care. Clear liquid diet. Will follow.    LOS: 0 days   [name]  04/18/2023, 9:57 AM  Questions please call [phone]

## 2023-04-18 NOTE — Care Management Obs Status (Cosign Needed)
 MEDICARE OBSERVATION STATUS NOTIFICATION   Patient Details  Name: Lindsey Huber MRN: [id number] Date of Birth: [birth year]   Medicare Observation Status Notification Given:  Yes    Janae Bridgeman, RN 04/18/2023, 4:36 PM

## 2023-04-19 DIAGNOSIS — K5791 Diverticulosis of intestine, part unspecified, without perforation or abscess with bleeding: Secondary | ICD-10-CM | POA: Diagnosis not present

## 2023-04-19 LAB — CBC
HCT: 27.3 % — ABNORMAL LOW (ref 36.0–46.0)
Hemoglobin: 9 g/dL — ABNORMAL LOW (ref 12.0–15.0)
MCH: 32.7 pg (ref 26.0–34.0)
MCHC: 33 g/dL (ref 30.0–36.0)
MCV: 99.3 fL (ref 80.0–100.0)
Platelets: 123 10*3/uL — ABNORMAL LOW (ref 150–400)
RBC: 2.75 MIL/uL — ABNORMAL LOW (ref 3.87–5.11)
RDW: 15 % (ref 11.5–15.5)
WBC: 7.1 10*3/uL (ref 4.0–10.5)
nRBC: 0 % (ref 0.0–0.2)

## 2023-04-19 NOTE — Progress Notes (Addendum)
 Progress Note   Patient: Lindsey Huber ONG:[id number] DOB: [birth year] DOA: 04/17/2023     0 DOS: the patient was seen and examined on 04/19/2023   Brief hospital course:  Shari Natt is a 78 y.o. female with past medical history  of A-fib on Xarelto, SSS/PPM, ILD/pulmonary fibrosis/lupus followed by [name], COPD, diastolic CHF, OSA, aortic, mitral, tricuspid valve disease s/p AVR/MV ring/TVR, CKD 3 A presented to the ER for evaluation of rectal bleeding.  Prior history of rectal bleeding and GIB history in the  past when she has needed Kcentra, Vit K and vasopressors therapy, at that time patient's bleeding was attributed to diverticulitis. Pt states that her eliquis was resumed because of her valvular and heart history.    Assessment and Plan:  Rectal bleed concerning for diverticular bleed rule out ischemic colitis Acute blood loss anemia Patient presented to the ER for evaluation of rectal bleeding in the setting of chronic anticoagulation for A-fib CT angiogram showed no evidence of active bleeding at the time of imaging and scattered colonic diverticula History of left-sided diverticulosis and hemorrhoids from colonoscopy done 11/22 Continue to hold Eliquis since patient had an episode of bleeding overnight. Appreciate GI input, continue to monitor serial H&H and advance diet to a full liquid diet       Atrial fibrillation (chronic) Rate controlled Eliquis is on hold due to acute blood loss anemia from rectal bleeding       Chronic diastolic dysfunction CHF Status post AICD placement Stable and not acutely exacerbated Last known LVEF of 60 to 65% with moderate LVH Lasix is on hold due to volume depletion from bleeding         Hypertension Blood pressure is stable       Seizure disorder Continue Keppra        SLE with related ILD, OA, and gouty arthritis  Continue hydroxychloroquine 200 mg daily and prednisone 5 mg daily       Subjective: No  new complaints.  Had an episode of bleeding overnight.  No abdominal pain  Physical Exam: Vitals:   04/19/23 0323 04/19/23 0740 04/19/23 0919 04/19/23 1204  BP: (!) 110/53 113/61  110/61  Pulse: 71 70  79  Resp: 18 17  18   Temp: 98.9 F (37.2 C) 98.7 F (37.1 C)  98.2 F (36.8 C)  TempSrc: Oral Oral  Oral  SpO2: 96% 97% 93% 94%  Weight:      Height:       Vitals and nursing note reviewed.  Constitutional:      Appearance: Normal appearance.     Comments: Frail  HENT:     Head: Normocephalic and atraumatic.     Nose: Nose normal.     Mouth/Throat:     Mouth: Mucous membranes are moist.  Eyes:     Comments: Pale conjunctiva  Cardiovascular:     Rate and Rhythm: Regular rhythm.  Pulmonary:     Effort: Pulmonary effort is normal.     Breath sounds: Normal breath sounds.  Abdominal:     General: Abdomen is flat. Bowel sounds are normal.     Palpations: Abdomen is soft.  Musculoskeletal:        General: Normal range of motion.     Cervical back: Normal range of motion and neck supple.  Skin:    General: Skin is warm and dry.  Neurological:     Mental Status: She is alert and oriented to person, place, and time.  Psychiatric:  Mood and Affect: Mood normal.        Behavior: Behavior normal.     Data Reviewed: Hemoglobin 9.0 Labs reviewed  Family Communication: Plan of care discussed with patient in detail.  For possible discharge in a.m.  Disposition: Status is: Observation The patient remains OBS appropriate and will d/c before 2 midnights.  Planned Discharge Destination: Home    Time spent: 33 minutes  Author: Lucile Shutters, MD 04/19/2023 1:59 PM  For on call review [email or web address].

## 2023-04-19 NOTE — Progress Notes (Signed)
 [facility] Gastroenterology Progress Note  Lindsey Huber 78 y.o. 1945-03-07   Subjective: Small amount of blood clots recently per nursing. Denies abdominal pain.  Objective: Vital signs: Vitals:   04/19/23 0919 04/19/23 1204  BP:  110/61  Pulse:  79  Resp:  18  Temp:  98.2 F (36.8 C)  SpO2: 93% 94%    Physical Exam: Gen: lethargic, thin, elderly, no acute distress, pleasant HEENT: anicteric sclera CV: RRR Chest: CTA B Abd: soft, nontender, nondistended, +BS Ext: no edema  Lab Results: Recent Labs    04/17/23 1405 04/18/23 0846  NA 138 138  K 3.7 3.9  CL 101 102  CO2 26 27  GLUCOSE 74 77  BUN 20 13  CREATININE 1.02* 0.95  CALCIUM 9.1 9.0   Recent Labs    04/17/23 1405  AST 31  ALT 13  ALKPHOS 33*  BILITOT 0.8  PROT 6.9  ALBUMIN 3.2*   Recent Labs    04/18/23 0846 04/19/23 0558  WBC 6.3 7.1  HGB 9.9* 9.0*  HCT 30.8* 27.3*  MCV 100.7* 99.3  PLT 136* 123*      Assessment/Plan: Rectal bleeding likely diverticular that is improving. Hgb 9. Changed diet to full liquid. Continue supportive care. Follow H/Hs closely. Home in 1-2 days. Will follow.   [name] 04/19/2023, 1:21 PM  Questions please call [phone] ID: Lindsey Huber, female   DOB: 1945/06/21, 78 y.o.   MRN: [id number]

## 2023-04-20 DIAGNOSIS — K5791 Diverticulosis of intestine, part unspecified, without perforation or abscess with bleeding: Secondary | ICD-10-CM | POA: Diagnosis not present

## 2023-04-20 DIAGNOSIS — I5032 Chronic diastolic (congestive) heart failure: Secondary | ICD-10-CM | POA: Diagnosis not present

## 2023-04-20 DIAGNOSIS — D62 Acute posthemorrhagic anemia: Secondary | ICD-10-CM

## 2023-04-20 DIAGNOSIS — R569 Unspecified convulsions: Secondary | ICD-10-CM

## 2023-04-20 DIAGNOSIS — I10 Essential (primary) hypertension: Secondary | ICD-10-CM

## 2023-04-20 DIAGNOSIS — Z95810 Presence of automatic (implantable) cardiac defibrillator: Secondary | ICD-10-CM

## 2023-04-20 DIAGNOSIS — I482 Chronic atrial fibrillation, unspecified: Secondary | ICD-10-CM

## 2023-04-20 LAB — HEMOGLOBIN AND HEMATOCRIT, BLOOD
HCT: 28.6 % — ABNORMAL LOW (ref 36.0–46.0)
Hemoglobin: 9.4 g/dL — ABNORMAL LOW (ref 12.0–15.0)

## 2023-04-20 MED ORDER — PREDNISONE 10 MG PO TABS
10.0000 mg | ORAL_TABLET | Freq: Every day | ORAL | Status: DC
Start: 2023-04-21 — End: 2023-04-21
  Administered 2023-04-21: 10 mg via ORAL
  Filled 2023-04-20: qty 1

## 2023-04-20 NOTE — Plan of Care (Signed)
  Problem: Education: Goal: Knowledge of General Education information will improve Description: Including pain rating scale, medication(s)/side effects and non-pharmacologic comfort measures Outcome: Progressing   Problem: Health Behavior/Discharge Planning: Goal: Ability to manage health-related needs will improve Outcome: Progressing   Problem: Clinical Measurements: Goal: Ability to maintain clinical measurements within normal limits will improve Outcome: Progressing Goal: Will remain free from infection Outcome: Progressing Goal: Diagnostic test results will improve Outcome: Progressing Goal: Respiratory complications will improve Outcome: Progressing Goal: Cardiovascular complication will be avoided Outcome: Progressing   Problem: Activity: Goal: Risk for activity intolerance will decrease Outcome: Progressing   Problem: Nutrition: Goal: Adequate nutrition will be maintained Outcome: Progressing   Problem: Coping: Goal: Level of anxiety will decrease Outcome: Progressing   Problem: Elimination: Goal: Will not experience complications related to bowel motility Outcome: Progressing Goal: Will not experience complications related to urinary retention Outcome: Progressing   Problem: Pain Managment: Goal: General experience of comfort will improve and/or be controlled Outcome: Progressing   Problem: Safety: Goal: Ability to remain free from injury will improve Outcome: Progressing   Problem: Skin Integrity: Goal: Risk for impaired skin integrity will decrease Outcome: Progressing   Problem: Education: Goal: Ability to identify signs and symptoms of gastrointestinal bleeding will improve Outcome: Progressing   Problem: Bowel/Gastric: Goal: Will show no signs and symptoms of gastrointestinal bleeding Outcome: Progressing   Problem: Fluid Volume: Goal: Will show no signs and symptoms of excessive bleeding Outcome: Progressing   Problem: Clinical  Measurements: Goal: Complications related to the disease process, condition or treatment will be avoided or minimized Outcome: Progressing

## 2023-04-20 NOTE — Progress Notes (Signed)
 PROGRESS NOTE    Lindsey Huber  ZOX:[id number] DOB: [birth year] DOA: 04/17/2023 PCP: Collene Mares, PA   Chief Complaint  Patient presents with   Rectal Bleeding   Nausea    Brief Narrative:  Lindsey Huber is a 78 y.o. female with past medical history  of A-fib on Xarelto, SSS/PPM, ILD/pulmonary fibrosis/lupus followed by [name], COPD, diastolic CHF, OSA, aortic, mitral, tricuspid valve disease s/p AVR/MV ring/TVR, CKD 3 A presented to the ER for evaluation of rectal bleeding.  Prior history of rectal bleeding and GIB history in the  past when she has needed Kcentra, Vit K and vasopressors therapy, at that time patient's bleeding was attributed to diverticulitis. Pt states that her eliquis was resumed because of her valvular and heart history.    Assessment & Plan:   Principal Problem:   GIB (gastrointestinal bleeding) Active Problems:   Essential (primary) hypertension   Gastroesophageal reflux disease   Chronic heart failure with preserved ejection fraction   Chronic kidney disease (CKD), active medical management without dialysis, stage 3 (moderate)   AICD (automatic cardioverter/defibrillator) present   ABLA (acute blood loss anemia)   Seizure (HCC)   Chronic a-fib (HCC)   Acute upper GI bleed  #1 rectal bleed concerning for diverticular bleed/acute blood loss anemia -Patient noted to have presented to the ED for rectal bleeding the setting of chronic anticoagulation with Eliquis for A-fib. -CT angiogram abdomen and pelvis done with no evidence of active bleeding at the time of imaging and scattered colonic diverticula. -Patient with history of left-sided diverticulosis and hemorrhoids from colonoscopy done 11/22. -Bleeding seems to be subsiding as patient with no further bleeding this morning. -Patient tolerating full liquid diet. -H&H this morning at 9.4. -Advance to a soft diet today. -Repeat H&H tomorrow. -GI to advise when anticoagulation may be  resumed. -GI following appreciate input and recommendations.  2.  Chronic A-fib -Currently rate controlled. -Eliquis on hold secondary to problem #1. -GI to advise when Eliquis may be resumed.  3.  Chronic diastolic CHF/status post AICD placement -Stable. -Last 2D echo with a EF of 60 to 65% with moderate LVH. -Continue to hold Lasix due to volume depletion secondary to GI bleed. -Could likely resume Lasix 2 to 3 days postdischarge.  4.  Hypertension -Stable.  5.  Seizure disorder -Stable. -Continue home regimen Keppra.  6.  SLE with related ILD, OA and gouty arthritis -Continue hydroxychloroquine 200 mg daily and prednisone 5 mg daily.   DVT prophylaxis: SCDs Code Status: Full Family Communication: Updated patient.  No family at bedside. Disposition: Home with home health once hemoglobin stabilizes and no further bleeding, hopefully in the next 24 hours.  Status is: Observation The patient remains OBS appropriate and will d/c before 2 midnights.   Consultants:  Gastroenterology: Dr. Bosie Clos 04/18/2023  Procedures:  CT angiogram abdomen and pelvis with and without contrast 04/17/2023   Antimicrobials:  Anti-infectives (From admission, onward)    Start     Dose/Rate Route Frequency Ordered Stop   04/17/23 1745  hydroxychloroquine (PLAQUENIL) tablet 200 mg        200 mg Oral Daily 04/17/23 1740           Subjective: Patient sitting up in recliner.  Denies any chest pain or shortness of breath.  No abdominal pain.  Denies any bloody bowel movements today.  Stated had small bloody bowel movement yesterday that seems to be improving.  Denies any abdominal pain.  Tolerating full liquid diet.  Objective:  Vitals:   04/19/23 1930 04/20/23 0100 04/20/23 0442 04/20/23 0737  BP: (!) 114/45 131/69 109/63 118/64  Pulse: 71 70 70 70  Resp: 18 (!) 22 17 18   Temp: 98.9 F (37.2 C) 97.9 F (36.6 C) 98 F (36.7 C) 97.9 F (36.6 C)  TempSrc: Oral Axillary    SpO2: 100%   98% 100%  Weight:      Height:        Intake/Output Summary (Last 24 hours) at 04/20/2023 1207 Last data filed at 04/19/2023 1615 Gross per 24 hour  Intake 456 ml  Output --  Net 456 ml   Filed Weights   04/17/23 1354  Weight: 54 kg    Examination:  General exam: Appears calm and comfortable  Respiratory system: Clear to auscultation.  No wheezes, no crackles, no.  Fair air movement.  Speaking in full sentences.  Respiratory effort normal. Cardiovascular system: RRR. 3/6 SEM. No JVD, murmurs, rubs, gallops or clicks. No pedal edema. Gastrointestinal system: Abdomen is nondistended, soft and nontender. No organomegaly or masses felt. Normal bowel sounds heard. Central nervous system: Alert and oriented. No focal neurological deficits. Extremities: Symmetric 5 x 5 power. Skin: No rashes, lesions or ulcers Psychiatry: Judgement and insight appear normal. Mood & affect appropriate.     Data Reviewed: I have personally reviewed following labs and imaging studies  CBC: Recent Labs  Lab 04/17/23 1405 04/17/23 1932 04/17/23 2247 04/18/23 0846 04/19/23 0558 04/20/23 0944  WBC 6.0  --   --  6.3 7.1  --   HGB 10.3* 9.1* 10.2* 9.9* 9.0* 9.4*  HCT 32.0* 28.6* 31.9* 30.8* 27.3* 28.6*  MCV 100.3*  --   --  100.7* 99.3  --   PLT 134*  --   --  136* 123*  --     Basic Metabolic Panel: Recent Labs  Lab 04/17/23 1405 04/18/23 0846  NA 138 138  K 3.7 3.9  CL 101 102  CO2 26 27  GLUCOSE 74 77  BUN 20 13  CREATININE 1.02* 0.95  CALCIUM 9.1 9.0    GFR: Estimated Creatinine Clearance: 42.3 mL/min (by C-G formula based on SCr of 0.95 mg/dL).  Liver Function Tests: Recent Labs  Lab 04/17/23 1405  AST 31  ALT 13  ALKPHOS 33*  BILITOT 0.8  PROT 6.9  ALBUMIN 3.2*    CBG: No results for input(s): "GLUCAP" in the last 168 hours.   No results found for this or any previous visit (from the past 240 hours).       Radiology Studies: No results  found.      Scheduled Meds:  calcium-vitamin D  2 tablet Oral BID   cholecalciferol  1,000 Units Oral Q lunch   fluticasone  1 spray Each Nare Daily   fluticasone furoate-vilanterol  1 puff Inhalation Daily   And   umeclidinium bromide  1 puff Inhalation Daily   hydroxychloroquine  200 mg Oral Daily   levETIRAcetam  500 mg Oral BID   magnesium oxide  400 mg Oral Daily   pantoprazole (PROTONIX) IV  40 mg Intravenous Q12H   [START ON 04/21/2023] predniSONE  10 mg Oral Q breakfast   sodium chloride flush  3-10 mL Intravenous Q12H   Continuous Infusions:   LOS: 0 days    Time spent: 40 minutes    Ramiro Harvest, MD [facility]   To contact the attending provider between 7A-7P or the covering provider during after hours 7P-7A, please log into the web  site [email or web address] and access using universal [place] password for that web site. If you do not have the password, please call the hospital operator.  04/20/2023, 12:07 PM

## 2023-04-20 NOTE — TOC Progression Note (Signed)
 Transition of Care [facility]) - Progression Note    Patient Details  Name: Lindsey Huber MRN: [id number] Date of Birth: 03/19/1945  Transition of Care [facility]) CM/SW Contact  Janae Bridgeman, RN Phone Number: 04/20/2023, 11:15 AM  Clinical Narrative:    CM met with the patient at the bedside and offered Medicare choice regarding home health and patient states that she preferred to go back with [facility]  I called Amy Hyatt, RNCM with [facility] and they accepted and are able to provide PT/OT [facility] services.  HH orders are in place at this time.  No other TOC needs at this time.        Expected Discharge Plan and Services                                               Social Determinants of Health (SDOH) Interventions SDOH Screenings   Food Insecurity: No Food Insecurity (04/17/2023)  Housing: Low Risk  (04/17/2023)  Transportation Needs: No Transportation Needs (04/17/2023)  Utilities: Not At Risk (04/17/2023)  Social Connections: Moderately Isolated (04/17/2023)  Tobacco Use: Low Risk  (04/17/2023)    Readmission Risk Interventions    11/05/2022   10:54 AM 07/05/2022   12:36 PM 12/27/2021    3:07 PM  Readmission Risk Prevention Plan  Transportation Screening Complete Complete Complete  PCP or Specialist Appt within 3-5 Days  Complete Complete  HRI or Home Care Consult  Complete Complete  Social Work Consult for Recovery Care Planning/Counseling  Complete Complete  Palliative Care Screening  Not Applicable Complete  Medication Review Oceanographer) Referral to Pharmacy Referral to Pharmacy Complete  PCP or Specialist appointment within 3-5 days of discharge Complete    HRI or Home Care Consult Complete    SW Recovery Care/Counseling Consult Complete    Palliative Care Screening Not Applicable    Skilled Nursing Facility Not Applicable

## 2023-04-20 NOTE — Progress Notes (Signed)
 Physical Therapy Evaluation Patient Details Name: Lindsey Huber MRN: [id number] DOB: April 02, 1945 Today's Date: 04/20/2023  History of Present Illness  78 y.o. female presents to [facility] 04/17/23 with rectal bleeding, likely diverticular. PMHx: A-fib on Xarelto, SSS/PPM, ILD/pulmonary fibrosis/lupus, COPD, diastolic CHF, OSA, aortic, mitral, tricuspid valve disease s/p AVR/MV ring/TVR, CKD 3 A   Clinical Impression  Pt in bed upon arrival and agreeable to PT eval. PTA, pt was ModI for mobility with a rollator. In today's session, pt required ModA to stand with a RW and was able to ambulate ~30 ft with CGA. Pt has 24/7 physical assist at home and states that her family is comfortable assisting physically. Pt currently with functional limitations due to the deficits listed below (see PT Problem List). Pt would benefit from acute skilled PT to address functional impairments. Recommending post-acute HHPT to work towards independence with mobility. Acute PT to follow.         If plan is discharge home, recommend the following: A little help with walking and/or transfers;A little help with bathing/dressing/bathroom;Assist for transportation;Help with stairs or ramp for entrance;Assistance with cooking/housework   Can travel by private vehicle    Yes    Equipment Recommendations None recommended by PT (pt owns equipment)  Recommendations for Other Services  OT consult    Functional Status Assessment Patient has had a recent decline in their functional status and demonstrates the ability to make significant improvements in function in a reasonable and predictable amount of time.     Precautions / Restrictions Precautions Precautions: Fall Restrictions Weight Bearing Restrictions Per Provider Order: No      Mobility  Bed Mobility Overal bed mobility: Needs Assistance Bed Mobility: Supine to Sit    Supine to sit: Supervision, HOB elevated    General bed mobility comments: increased time,  supervision for safety    Transfers Overall transfer level: Needs assistance Equipment used: Rolling walker (2 wheels) Transfers: Sit to/from Stand Sit to Stand: Mod assist    General transfer comment: ModA for boost up, increased time and effort    Ambulation/Gait Ambulation/Gait assistance: Contact guard assist Gait Distance (Feet): 30 Feet Assistive device: Rolling walker (2 wheels) Gait Pattern/deviations: Step-through pattern, Shuffle, Trunk flexed Gait velocity: decr     General Gait Details: short and shuffled steps, cues for upright posture as pt tends to have a foward flexed trunk       Balance Overall balance assessment: Needs assistance, Mild deficits observed, not formally tested Sitting-balance support: No upper extremity supported, Feet supported Sitting balance-Leahy Scale: Good     Standing balance support: Bilateral upper extremity supported, During functional activity, Reliant on assistive device for balance Standing balance-Leahy Scale: Poor Standing balance comment: reliant on RW       Pertinent Vitals/Pain Pain Assessment Pain Assessment: No/denies pain    Home Living Family/patient expects to be discharged to:: Private residence Living Arrangements: Children;Other relatives Available Help at Discharge: Family;Available 24 hours/day Type of Home: House Home Access: Level entry       Home Layout: One level Home Equipment: Pharmacist, hospital (2 wheels);Cane - quad;Wheelchair - manual;Tub bench;Adaptive equipment;Rollator (4 wheels)      Prior Function Prior Level of Function : Needs assist    Mobility Comments: ModI with rollator for short distances. Reports 1 fall in the past 6 months due to tripped and having one knee buckle ADLs Comments: family makes meals and assists with driving     Extremity/Trunk Assessment   Upper Extremity Assessment Upper  Extremity Assessment: Defer to OT evaluation    Lower Extremity  Assessment Lower Extremity Assessment: Generalized weakness    Cervical / Trunk Assessment Cervical / Trunk Assessment: Kyphotic  Communication   Communication Communication: No apparent difficulties    Cognition Arousal: Alert Behavior During Therapy: WFL for tasks assessed/performed   PT - Cognitive impairments: No apparent impairments    Following commands: Intact       Cueing Cueing Techniques: Verbal cues     General Comments General comments (skin integrity, edema, etc.): VSS on RA     PT Assessment Patient needs continued PT services  PT Problem List Decreased strength;Decreased activity tolerance;Decreased balance;Decreased mobility       PT Treatment Interventions DME instruction;Gait training;Functional mobility training;Therapeutic activities;Balance training;Therapeutic exercise;Neuromuscular re-education;Patient/family education    PT Goals (Current goals can be found in the Care Plan section)  Acute Rehab PT Goals Patient Stated Goal: to get stronger PT Goal Formulation: With patient Time For Goal Achievement: 05/04/23 Potential to Achieve Goals: Good    Frequency Min 2X/week        AM-PAC PT "6 Clicks" Mobility  Outcome Measure Help needed turning from your back to your side while in a flat bed without using bedrails?: A Little Help needed moving from lying on your back to sitting on the side of a flat bed without using bedrails?: A Little Help needed moving to and from a bed to a chair (including a wheelchair)?: A Lot Help needed standing up from a chair using your arms (e.g., wheelchair or bedside chair)?: A Lot Help needed to walk in hospital room?: A Little Help needed climbing 3-5 steps with a railing? : A Lot 6 Click Score: 15    End of Session Equipment Utilized During Treatment: Gait belt Activity Tolerance: Patient tolerated treatment well Patient left: in chair;with call bell/phone within reach;with chair alarm set Nurse  Communication: Mobility status PT Visit Diagnosis: Other abnormalities of gait and mobility (R26.89);Muscle weakness (generalized) (M62.81)    Time: 1610-9604 PT Time Calculation (min) (ACUTE ONLY): 17 min   Charges:   PT Evaluation $PT Eval Low Complexity: 1 Low   PT General Charges $$ ACUTE PT VISIT: 1 Visit        Hilton Cork, PT, DPT Secure Chat Preferred  Rehab Office [phone]   Arturo Morton Brion Aliment 04/20/2023, 8:49 AM

## 2023-04-20 NOTE — Plan of Care (Signed)
 Pt is A&O x 4. VSS, on room air. V-paced on the telemetry monitor. Few small clots noted in the urine. No active bleeding noted. pt asymptomatic. Safety maintained. Bed alarm on. Call bell in reach. Will continue to monitor.    Problem: Education: Goal: Knowledge of General Education information will improve Description: Including pain rating scale, medication(s)/side effects and non-pharmacologic comfort measures Outcome: Progressing   Problem: Health Behavior/Discharge Planning: Goal: Ability to manage health-related needs will improve Outcome: Progressing   Problem: Clinical Measurements: Goal: Ability to maintain clinical measurements within normal limits will improve Outcome: Progressing Goal: Will remain free from infection Outcome: Progressing Goal: Diagnostic test results will improve Outcome: Progressing Goal: Respiratory complications will improve Outcome: Progressing Goal: Cardiovascular complication will be avoided Outcome: Progressing   Problem: Activity: Goal: Risk for activity intolerance will decrease Outcome: Progressing   Problem: Nutrition: Goal: Adequate nutrition will be maintained Outcome: Progressing   Problem: Coping: Goal: Level of anxiety will decrease Outcome: Progressing   Problem: Elimination: Goal: Will not experience complications related to bowel motility Outcome: Progressing Goal: Will not experience complications related to urinary retention Outcome: Progressing   Problem: Pain Managment: Goal: General experience of comfort will improve and/or be controlled Outcome: Progressing   Problem: Safety: Goal: Ability to remain free from injury will improve Outcome: Progressing   Problem: Skin Integrity: Goal: Risk for impaired skin integrity will decrease Outcome: Progressing   Problem: Education: Goal: Ability to identify signs and symptoms of gastrointestinal bleeding will improve Outcome: Progressing   Problem:  Bowel/Gastric: Goal: Will show no signs and symptoms of gastrointestinal bleeding Outcome: Progressing   Problem: Fluid Volume: Goal: Will show no signs and symptoms of excessive bleeding Outcome: Progressing   Problem: Clinical Measurements: Goal: Complications related to the disease process, condition or treatment will be avoided or minimized Outcome: Progressing

## 2023-04-20 NOTE — Evaluation (Signed)
 Occupational Therapy Evaluation Patient Details Name: Lindsey Huber MRN: [id number] DOB: 1945-12-24 Today's Date: 04/20/2023   History of Present Illness   78 y.o. female presents to [facility] 04/17/23 with rectal bleeding, likely diverticular. PMHx: A-fib on Xarelto, SSS/PPM, ILD/pulmonary fibrosis/lupus, COPD, diastolic CHF, OSA, aortic, mitral, tricuspid valve disease s/p AVR/MV ring/TVR, CKD 3 A     Clinical Impressions Pt reports ind at baseline with ADLs and uses rollator for mobility. Pt currently needing CGA- min A for ADLs, and min A for transfers with RW. Pt able to ambulate across room to [facility] to perform toileting, pericare, LB ADL and standing grooming task. Reports feeling mild weakness compared to baseline. Pt presenting with impairments listed below, will follow acutely. Recommend HHOT at d/c.      If plan is discharge home, recommend the following:   A little help with walking and/or transfers;A little help with bathing/dressing/bathroom;Assistance with cooking/housework;Direct supervision/assist for medications management;Direct supervision/assist for financial management;Assist for transportation;Help with stairs or ramp for entrance     Functional Status Assessment   Patient has had a recent decline in their functional status and demonstrates the ability to make significant improvements in function in a reasonable and predictable amount of time.     Equipment Recommendations   None recommended by OT     Recommendations for Other Services   PT consult     Precautions/Restrictions   Precautions Precautions: Fall Restrictions Weight Bearing Restrictions Per Provider Order: No     Mobility Bed Mobility               General bed mobility comments: up in chair upon arrival and departure    Transfers Overall transfer level: Needs assistance Equipment used: Rolling walker (2 wheels) Transfers: Sit to/from Stand Sit to Stand: Min assist                   Balance Overall balance assessment: Needs assistance, Mild deficits observed, not formally tested Sitting-balance support: No upper extremity supported, Feet supported Sitting balance-Leahy Scale: Good     Standing balance support: Bilateral upper extremity supported, During functional activity, Reliant on assistive device for balance Standing balance-Leahy Scale: Poor Standing balance comment: reliant on RW                           ADL either performed or assessed with clinical judgement   ADL Overall ADL's : Needs assistance/impaired Eating/Feeding: Set up   Grooming: Set up;Sitting   Upper Body Bathing: Minimal assistance;Sitting   Lower Body Bathing: Minimal assistance;Sitting/lateral leans   Upper Body Dressing : Contact guard assist;Sitting   Lower Body Dressing: Contact guard assist;Sitting/lateral leans   Toilet Transfer: Contact guard assist;Ambulation;Rolling walker (2 wheels);BSC/3in1   Toileting- Clothing Manipulation and Hygiene: Contact guard assist       Functional mobility during ADLs: Contact guard assist;Rolling walker (2 wheels)       Vision   Vision Assessment?: No apparent visual deficits     Perception Perception: Not tested       Praxis Praxis: Not tested       Pertinent Vitals/Pain Pain Assessment Pain Assessment: No/denies pain     Extremity/Trunk Assessment Upper Extremity Assessment Upper Extremity Assessment: Generalized weakness   Lower Extremity Assessment Lower Extremity Assessment: Defer to PT evaluation   Cervical / Trunk Assessment Cervical / Trunk Assessment: Kyphotic   Communication Communication Communication: No apparent difficulties   Cognition Arousal: Alert Behavior During Therapy: [facility] for  tasks assessed/performed                                 Following commands: Intact       Cueing  General Comments   Cueing Techniques: Verbal cues  VSS on RA   Exercises      Shoulder Instructions      Home Living Family/patient expects to be discharged to:: Private residence Living Arrangements: Children;Other relatives (daughter and granddaughter) Available Help at Discharge: Family;Available 24 hours/day Type of Home: House Home Access: Level entry     Home Layout: One level     Bathroom Shower/Tub: Chief Strategy Officer: Handicapped height Bathroom Accessibility: Yes   Home Equipment: Pharmacist, hospital (2 wheels);Cane - quad;Wheelchair - manual;Tub bench;Adaptive equipment;Rollator (4 wheels) Adaptive Equipment: Reacher;Long-handled sponge        Prior Functioning/Environment Prior Level of Function : Needs assist             Mobility Comments: ModI with rollator for short distances. Reports 1 fall in the past 6 months due to tripped and having one knee buckle ADLs Comments: family makes meals and assists with driving, daughter assists with med mgmt    OT Problem List: Decreased strength;Decreased range of motion;Decreased activity tolerance;Impaired balance (sitting and/or standing)   OT Treatment/Interventions: Self-care/ADL training;Therapeutic exercise;Energy conservation;DME and/or AE instruction;Therapeutic activities;Balance training;Patient/family education      OT Goals(Current goals can be found in the care plan section)   Acute Rehab OT Goals Patient Stated Goal: none stated OT Goal Formulation: With patient Time For Goal Achievement: 05/04/23 Potential to Achieve Goals: Good ADL Goals Pt Will Perform Upper Body Dressing: Independently;sitting Pt Will Perform Lower Body Dressing: Independently;sitting/lateral leans;sit to/from stand Pt Will Transfer to Toilet: Independently;ambulating;regular height toilet Pt Will Perform Tub/Shower Transfer: Shower transfer;Tub transfer;Independently;ambulating;shower seat   OT Frequency:  Min 2X/week    Co-evaluation              AM-PAC OT "6  Clicks" Daily Activity     Outcome Measure Help from another person eating meals?: A Little Help from another person taking care of personal grooming?: A Little Help from another person toileting, which includes using toliet, bedpan, or urinal?: A Little Help from another person bathing (including washing, rinsing, drying)?: A Little Help from another person to put on and taking off regular upper body clothing?: A Little Help from another person to put on and taking off regular lower body clothing?: A Little 6 Click Score: 18   End of Session Equipment Utilized During Treatment: Gait belt;Rolling walker (2 wheels) Nurse Communication: Mobility status  Activity Tolerance: Patient tolerated treatment well Patient left: in chair;with call bell/phone within reach;with chair alarm set  OT Visit Diagnosis: Unsteadiness on feet (R26.81);Other abnormalities of gait and mobility (R26.89);Muscle weakness (generalized) (M62.81)                Time: 8295-6213 OT Time Calculation (min): 19 min Charges:  OT General Charges $OT Visit: 1 Visit OT Evaluation $OT Eval Low Complexity: 1 Low  Lindsey Huber, OTD, OTR/L SecureChat Preferred Acute Rehab [phone]   Lindsey Huber 04/20/2023, 12:11 PM

## 2023-04-20 NOTE — Progress Notes (Signed)
 [facility] Gastroenterology Progress Note  Lindsey Huber 78 y.o. [birth year]   Subjective: Denies rectal bleeding overnight. No stool documented in flowsheets. Denies abdominal pain. Sitting in bedside chair.  Objective: Vital signs: Vitals:   04/20/23 0442 04/20/23 0737  BP: 109/63 118/64  Pulse: 70 70  Resp: 17 18  Temp: 98 F (36.7 C) 97.9 F (36.6 C)  SpO2: 98% 100%    Physical Exam: Gen: lethargic, elderly, thin, no acute distress, pleasant HEENT: anicteric sclera CV: RRR Chest: CTA B Abd: soft, nondistended, nontender, +BS Ext: no edema  Lab Results: Recent Labs    04/17/23 1405 04/18/23 0846  NA 138 138  K 3.7 3.9  CL 101 102  CO2 26 27  GLUCOSE 74 77  BUN 20 13  CREATININE 1.02* 0.95  CALCIUM 9.1 9.0   Recent Labs    04/17/23 1405  AST 31  ALT 13  ALKPHOS 33*  BILITOT 0.8  PROT 6.9  ALBUMIN 3.2*   Recent Labs    04/18/23 0846 04/19/23 0558  WBC 6.3 7.1  HGB 9.9* 9.0*  HCT 30.8* 27.3*  MCV 100.7* 99.3  PLT 136* 123*      Assessment/Plan: Rectal bleeding suspect diverticular that is resolving. Await repeat H/H today and if stable advance diet. Supportive care. Will sign off. Call if questions.   Lindsey Huber 04/20/2023, 9:24 AM  Questions please call [phone] ID: Lindsey Huber, female   DOB: 1945/06/04, 78 y.o.   MRN: [id number]

## 2023-04-21 ENCOUNTER — Other Ambulatory Visit: Payer: Self-pay | Admitting: Neurology

## 2023-04-21 DIAGNOSIS — I5032 Chronic diastolic (congestive) heart failure: Secondary | ICD-10-CM | POA: Diagnosis not present

## 2023-04-21 DIAGNOSIS — K219 Gastro-esophageal reflux disease without esophagitis: Secondary | ICD-10-CM

## 2023-04-21 DIAGNOSIS — N183 Chronic kidney disease, stage 3 unspecified: Secondary | ICD-10-CM

## 2023-04-21 DIAGNOSIS — K625 Hemorrhage of anus and rectum: Principal | ICD-10-CM

## 2023-04-21 DIAGNOSIS — D62 Acute posthemorrhagic anemia: Secondary | ICD-10-CM | POA: Diagnosis not present

## 2023-04-21 DIAGNOSIS — K5791 Diverticulosis of intestine, part unspecified, without perforation or abscess with bleeding: Secondary | ICD-10-CM | POA: Diagnosis not present

## 2023-04-21 LAB — BPAM RBC
Blood Product Expiration Date: 202504012359
Blood Product Expiration Date: 202504022359
Unit Type and Rh: 9500
Unit Type and Rh: 9500

## 2023-04-21 LAB — BASIC METABOLIC PANEL WITH GFR
Anion gap: 10 (ref 5–15)
BUN: 19 mg/dL (ref 8–23)
CO2: 25 mmol/L (ref 22–32)
Calcium: 8.9 mg/dL (ref 8.9–10.3)
Chloride: 101 mmol/L (ref 98–111)
Creatinine, Ser: 1.33 mg/dL — ABNORMAL HIGH (ref 0.44–1.00)
GFR, Estimated: 42 mL/min — ABNORMAL LOW (ref >60)
Glucose, Bld: 83 mg/dL (ref 70–99)
Potassium: 4.3 mmol/L (ref 3.5–5.1)
Sodium: 136 mmol/L (ref 135–145)

## 2023-04-21 LAB — TYPE AND SCREEN
ABO/RH(D): A NEG
Antibody Screen: POSITIVE
DAT, IgG: NEGATIVE
Unit division: 0
Unit division: 0

## 2023-04-21 LAB — HEMOGLOBIN AND HEMATOCRIT, BLOOD
HCT: 26.7 % — ABNORMAL LOW (ref 36.0–46.0)
Hemoglobin: 8.8 g/dL — ABNORMAL LOW (ref 12.0–15.0)

## 2023-04-21 MED ORDER — FUROSEMIDE 40 MG PO TABS: 40.0000 mg | ORAL_TABLET | Freq: Every day | ORAL | Status: DC

## 2023-04-21 MED ORDER — SODIUM CHLORIDE 0.9 % IV BOLUS: 500.0000 mL | Freq: Once | INTRAVENOUS | Status: DC

## 2023-04-21 MED ORDER — APIXABAN 2.5 MG PO TABS: 2.5000 mg | ORAL_TABLET | Freq: Two times a day (BID) | ORAL | Status: DC

## 2023-04-21 MED ORDER — CETIRIZINE HCL 5 MG/5ML PO SOLN: 1.0000 mL | Freq: Two times a day (BID) | ORAL | Status: AC | PRN

## 2023-04-21 MED ORDER — POTASSIUM CHLORIDE CRYS ER 10 MEQ PO TBCR
20.0000 meq | EXTENDED_RELEASE_TABLET | Freq: Every day | ORAL | Status: DC
Start: 2023-04-25 — End: 2023-06-03

## 2023-04-21 MED ORDER — SODIUM CHLORIDE 0.9 % IV BOLUS
250.0000 mL | Freq: Once | INTRAVENOUS | Status: AC
  Administered 2023-04-21: 250 mL via INTRAVENOUS

## 2023-04-21 NOTE — Plan of Care (Addendum)
 Patient preparing for discharge after IVF bolus.     Discharge instructions discussed with patient.  Patient instructed on home medications, restrictions, and follow up appointments. Belongings gathered and sent with patient.  No new medications  Patient discharged to discharge lounge

## 2023-04-21 NOTE — Progress Notes (Signed)
 [facility] Gastroenterology Progress Note  Lindsey Huber 78 y.o. [birth year]   Subjective: No BMs or rectal bleeding since 3/25. Tolerating solid food. Denies abdominal pain.  Objective: Vital signs: Vitals:   04/21/23 0358 04/21/23 0737  BP: (!) 106/51   Pulse: 69   Resp: 19   Temp: 98.4 F (36.9 C)   SpO2: 98% 98%    Physical Exam: Gen: lethargic, thin, elderly, no acute distress, pleasant HEENT: anicteric sclera CV: RRR Chest: CTA B Abd: soft, nontender, nondistended, +BS Ext: no edema  Lab Results: Recent Labs    04/21/23 0748  NA 136  K 4.3  CL 101  CO2 25  GLUCOSE 83  BUN 19  CREATININE 1.33*  CALCIUM 8.9   No results for input(s): "AST", "ALT", "ALKPHOS", "BILITOT", "PROT", "ALBUMIN" in the last 72 hours. Recent Labs    04/19/23 0558 04/20/23 0944 04/21/23 0748  WBC 7.1  --   --   HGB 9.0* 9.4* 8.8*  HCT 27.3* 28.6* 26.7*  MCV 99.3  --   --   PLT 123*  --   --       Assessment/Plan: GI bleed likely diverticular that is resolving. Hgb 8.8 without any signs of ongoing GI bleeding. Ok to go home today from GI standpoint and resume anticoagulation in 2 days. F/U with Dr. Dulce Sellar in 4-6 weeks. Will sign off. Call if questions.   Lindsey Huber 04/21/2023, 9:52 AM  Questions please call [phone] ID: Lindsey Huber, female   DOB: 12-19-1945, 78 y.o.   MRN: [id number]

## 2023-04-21 NOTE — Plan of Care (Signed)
  Problem: Education: Goal: Knowledge of General Education information will improve Description: Including pain rating scale, medication(s)/side effects and non-pharmacologic comfort measures Outcome: Progressing   Problem: Health Behavior/Discharge Planning: Goal: Ability to manage health-related needs will improve Outcome: Progressing   Problem: Clinical Measurements: Goal: Ability to maintain clinical measurements within normal limits will improve Outcome: Progressing Goal: Will remain free from infection Outcome: Progressing Goal: Diagnostic test results will improve Outcome: Progressing Goal: Respiratory complications will improve Outcome: Progressing Goal: Cardiovascular complication will be avoided Outcome: Progressing   Problem: Activity: Goal: Risk for activity intolerance will decrease Outcome: Progressing   Problem: Nutrition: Goal: Adequate nutrition will be maintained Outcome: Progressing   Problem: Coping: Goal: Level of anxiety will decrease Outcome: Progressing   Problem: Elimination: Goal: Will not experience complications related to bowel motility Outcome: Progressing Goal: Will not experience complications related to urinary retention Outcome: Progressing   Problem: Pain Managment: Goal: General experience of comfort will improve and/or be controlled Outcome: Progressing   Problem: Safety: Goal: Ability to remain free from injury will improve Outcome: Progressing   Problem: Skin Integrity: Goal: Risk for impaired skin integrity will decrease Outcome: Progressing   Problem: Education: Goal: Ability to identify signs and symptoms of gastrointestinal bleeding will improve Outcome: Progressing   Problem: Bowel/Gastric: Goal: Will show no signs and symptoms of gastrointestinal bleeding Outcome: Progressing   Problem: Fluid Volume: Goal: Will show no signs and symptoms of excessive bleeding Outcome: Progressing   Problem: Clinical  Measurements: Goal: Complications related to the disease process, condition or treatment will be avoided or minimized Outcome: Progressing

## 2023-04-21 NOTE — Discharge Summary (Signed)
 Physician Discharge Summary  Lindsey Huber ZOX:[id number] DOB: 12-24-1945 DOA: 04/17/2023  PCP: Collene Mares, PA  Admit date: 04/17/2023 Discharge date: 04/21/2023  Time spent: 60 minutes  Recommendations for Outpatient Follow-up:  Follow-up with Hyacinth Meeker, [place] E, PA in 2 weeks.  On follow-up patient will need a CBC done to follow-up on counts.  Patient will need a basic metabolic profile done to follow-up on electrolytes and renal function. Follow-up with Dr. Dulce Sellar, gastroenterology in 1 month.   Discharge Diagnoses:  Principal Problem:   GIB (gastrointestinal bleeding) Active Problems:   Essential (primary) hypertension   Gastroesophageal reflux disease   Chronic heart failure with preserved ejection fraction   Chronic kidney disease (CKD), active medical management without dialysis, stage 3 (moderate)   AICD (automatic cardioverter/defibrillator) present   ABLA (acute blood loss anemia)   Seizure (HCC)   Chronic a-fib (HCC)   Acute upper GI bleed   Rectal bleeding   Discharge Condition: Stable and improved.  Diet recommendation: Heart healthy  Filed Weights   04/17/23 1354  Weight: 54 kg    History of present illness:  HPI per Dr. Kizzie Bane is a 78 y.o. 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, COPD, diastolic CHF, OSA, aortic, mitral, tricuspid valve disease s/p AVR/MV ring/TVR, CKD 3 A coming for rectal bleeding. Pts' rectal bleeding and GIB history has happened in past when she has needed Kcentra and vit K and vasopressors therapy, at which time patient's bleeding was attributed to diverticulitis. Pt states that her eliquis was resumed because of her valvular and heart history.    >>ED Course: Pt in ed is alert awake and oriented. >>Vital signs in the ED were notable for the following:  Vitals are stable. Multiple Vitals        Vitals:    04/17/23 1730 04/17/23 1745 04/17/23 1800 04/17/23 1825  BP: 105/70 115/61 102/66    Pulse: 69 70 70    Temp:       97.7 F (36.5 C)  Resp: 13 20 16     Height:          Weight:          SpO2: 100% 100% 100%    TempSrc:       Oral  BMI (Calculated):              >>Labs were notable for the following: CMP shows AKI with a creatinine of 1.02, normal LFTs. Normal lactic acid. CBC shows hemoglobin of 10.3 platelets of 134   >>EKG: Independently reviewed: Ordered and pending.  >>Imaging and additional notable ED work-up:  Cta abd /pelvis negative for active bleed source.    >>While in the ED patient received the following: Medications  acetaminophen (TYLENOL) tablet 1,000 mg (has no administration in time range)  Vitamin D3 CAPS 1,000 Units (has no administration in time range)  fluticasone (FLONASE) 50 MCG/ACT nasal spray 1 spray (has no administration in time range)  calcium-vitamin D (OSCAL WITH D) 500-5 MG-MCG per tablet 2 tablet (has no administration in time range)  cetirizine HCl (Zyrtec) 5 MG/5ML solution 1 mg (has no administration in time range)  polyethylene glycol powder (GLYCOLAX/MIRALAX) container 17 g (has no administration in time range)  magnesium oxide (MAG-OX) tablet 400 mg (has no administration in time range)  budeson-glycopyrrolate-formoterol 160-9-4.8 MCG/ACT AERO 2 puff (has no administration in time range)  albuterol (VENTOLIN HFA) 108 (90 Base) MCG/ACT inhaler 2 puff (has no administration in time range)  hydroxychloroquine (PLAQUENIL) tablet 200 mg (has no administration in time range)  levETIRAcetam (KEPPRA) tablet 500 mg (has no administration in time range)  ondansetron (ZOFRAN) injection 4 mg (4 mg Intravenous Given 04/17/23 1456)  iohexol (OMNIPAQUE) 350 MG/ML injection 75 mL (75 mLs Intravenous Contrast Given 04/17/23 1530)    Hospital Course:  #1 rectal bleed likely secondary to diverticular  bleed/acute blood loss anemia -Patient noted to have presented to the ED for rectal bleeding the setting of chronic anticoagulation with Eliquis for A-fib. -CT angiogram abdomen and pelvis done with no evidence of active bleeding at the time of imaging and scattered colonic diverticula. -Patient with history of left-sided diverticulosis and hemorrhoids from colonoscopy done 11/22. -Bleeding subsided and improved during the hospitalization and patient had no further rectal bleeding since 04/18/2025.   -Diet was advanced and patient tolerated soft diet.   -Patient followed by gastroenterology throughout the hospitalization.   -Hemoglobin stabilized at 8.8 on day of discharge.   -Patient's anticoagulation with Eliquis to be resumed 2 days postdischarge on 04/23/2023 as directed by GI.   -Outpatient follow-up with primary gastroenterologist, Dr. Dulce Sellar in 4 weeks.   -Patient will be discharged in stable and improved condition.    2.  Chronic A-fib -Remained rate controlled.   -Eliquis was held secondary to problem #1 and cleared by GI to be resumed 2 days postdischarge on 04/23/2023.   3.  Chronic diastolic CHF/status post AICD placement -Stable throughout the hospitalization. -Last 2D echo with a EF of 60 to 65% with moderate LVH. -Lasix held during the hospitalization due to concerns for volume depletion secondary to GI bleed and will be resumed 3 to 4 days postdischarge.   -Outpatient follow-up.    4.  Hypertension -Stable.   5.  Seizure disorder -Stable. -No seizures noted during the hospitalization, patient maintained on home regimen Keppra.   6.  SLE with related ILD, OA and gouty arthritis -Patient maintained on home regimen hydroxychloroquine 200 mg daily and prednisone 5 mg daily.    Procedures: CT angiogram abdomen and pelvis with and without contrast 04/17/2023   Consultations: Gastroenterology: Dr. Bosie Clos 04/18/2023   Discharge Exam: Vitals:   04/21/23 0358 04/21/23 0737   BP: (!) 106/51   Pulse: 69   Resp: 19   Temp: 98.4 F (36.9 C)   SpO2: 98% 98%    General: NAD Cardiovascular: RRR no murmurs rubs or gallops.  No JVD.  No lower extremity edema. Respiratory: Clear to auscultation bilaterally.  No wheezes, no crackles, no rhonchi.  Fair air movement.  Speaking in full sentences.  Discharge Instructions   Discharge Instructions     Diet - low sodium heart healthy   Complete by: As directed    Increase activity slowly   Complete by: As directed       Allergies as of 04/21/2023       Reactions   Other Other (See Comments)   Patient is to NOT EAT any foods with husks or tree nuts, popcorn   Propofol Shortness Of Breath, Other (See Comments)   "Caused asthma"   Flecainide Other (See Comments)   Reaction not recalled   Amiodarone Other (See Comments)   Delirium/Confusion/Psychosis   Amlodipine Other (See Comments)   "Tired and syncope"        Medication List     TAKE these medications    acetaminophen 500 MG tablet  Commonly known as: TYLENOL Take 1,000 mg by mouth every 6 (six) hours as needed (pain).   albuterol 108 (90 Base) MCG/ACT inhaler Commonly known as: VENTOLIN HFA Inhale 2 puffs into the lungs every 6 (six) hours as needed for wheezing or shortness of breath.   allopurinol 100 MG tablet Commonly known as: ZYLOPRIM Take 1 tablet (100 mg total) by mouth daily.   amoxicillin 500 MG capsule Commonly known as: AMOXIL 4 capsules by mouth 1 hour before dental procedure.   apixaban 2.5 MG Tabs tablet Commonly known as: ELIQUIS Take 1 tablet (2.5 mg total) by mouth 2 (two) times daily. Start taking on: April 23, 2023 What changed: These instructions start on April 23, 2023. If you are unsure what to do until then, ask your doctor or other care provider.   Breztri Aerosphere 160-9-4.8 MCG/ACT Aero Generic drug: budeson-glycopyrrolate-formoterol Inhale 2 puffs into the lungs 2 (two) times daily as needed ("for  flares"). What changed: when to take this   calcium-vitamin D 500-5 MG-MCG tablet Commonly known as: OSCAL WITH D Take 2 tablets by mouth 2 (two) times daily. What changed:  how much to take when to take this   cetirizine HCl 5 MG/5ML Soln Commonly known as: Zyrtec Take 1 mL (1 mg total) by mouth 2 (two) times daily as needed for rhinitis or allergies.   fluticasone 50 MCG/ACT nasal spray Commonly known as: FLONASE PLACE 1 SPRAY INTO BOTH NOSTRILS 2 (TWO) TIMES DAILY What changed: See the new instructions.   furosemide 40 MG tablet Commonly known as: LASIX Take 1 tablet (40 mg total) by mouth daily. Start taking on: April 25, 2023 What changed: These instructions start on April 25, 2023. If you are unsure what to do until then, ask your doctor or other care provider.   hydroxychloroquine 200 MG tablet Commonly known as: PLAQUENIL Take 1 tablet (200 mg total) by mouth daily.   levETIRAcetam 500 MG tablet Commonly known as: KEPPRA Take 1 tablet (500 mg total) by mouth 2 (two) times daily.   magnesium oxide 400 (240 Mg) MG tablet Commonly known as: MAG-OX TAKE 1 TABLET BY MOUTH EVERY DAY   MiraLax 17 GM/SCOOP powder Generic drug: polyethylene glycol powder Take 17 g by mouth daily as needed for mild constipation.   Ofev 150 MG Caps Generic drug: Nintedanib Take 1 capsule (150 mg total) by mouth at bedtime.   OLANZapine 2.5 MG tablet Commonly known as: ZYPREXA TAKE 1 TABLET BY MOUTH EVERYDAY AT BEDTIME   pantoprazole 40 MG tablet Commonly known as: Protonix Take 1 tablet (40 mg total) by mouth 2 (two) times daily. What changed: when to take this   potassium chloride 10 MEQ tablet Commonly known as: Klor-Con M10 Take 2 tablets (20 mEq total) by mouth daily. Start taking on: April 25, 2023 What changed: These instructions start on April 25, 2023. If you are unsure what to do until then, ask your doctor or other care provider.   predniSONE 5 MG tablet Commonly  known as: DELTASONE Take 1 tablet (5 mg total) by mouth daily with breakfast. Patient to double up dose during sick day rule   Vitamin D3 25 MCG (1000 UT) Caps Take 1,000 Units by mouth daily with lunch.       Allergies  Allergen Reactions   Other Other (See Comments)    Patient is to NOT EAT any foods with husks or tree nuts, popcorn   Propofol Shortness Of Breath and Other (See Comments)    "  Caused asthma"   Flecainide Other (See Comments)    Reaction not recalled   Amiodarone Other (See Comments)    Delirium/Confusion/Psychosis   Amlodipine Other (See Comments)    "Tired and syncope"    Follow-up Information     [facility] Follow up.   Why: [facility] will re-start home health services for PT/OT.  They will call you in the next 24-48 hours to re-start services. Contact information: [address] [place] [address] [phone]         [place], [place], [name]. Schedule an appointment as soon as possible for a visit in 2 week(s).   Specialty: Internal Medicine Contact information: [address] [place] [address] [phone]         Willis Modena, MD. Schedule an appointment as soon as possible for a visit in 1 month(s).   Specialty: Gastroenterology Contact information: [address] [place] [address] [phone]                  The results of significant diagnostics from this hospitalization (including imaging, microbiology, ancillary and laboratory) are listed below for reference.    Significant Diagnostic Studies: CT Angio Abd/Pel W and/or Wo Contrast Result Date: 04/17/2023 CLINICAL DATA:  Rectal bleeding.  Possible acute lower GI bleed. EXAM: CTA ABDOMEN AND PELVIS WITHOUT AND WITH CONTRAST TECHNIQUE: Multidetector CT imaging of the abdomen and pelvis was performed using the standard protocol during bolus administration of intravenous contrast.  Multiplanar reconstructed images and MIPs were obtained and reviewed to evaluate the vascular anatomy. RADIATION DOSE REDUCTION: This exam was performed according to the departmental dose-optimization program which includes automated exposure control, adjustment of the mA and/or kV according to patient size and/or use of iterative reconstruction technique. CONTRAST:  75mL OMNIPAQUE IOHEXOL 350 MG/ML SOLN COMPARISON:  None Available. FINDINGS: VASCULAR Aorta: Normal caliber aorta without aneurysm, dissection, vasculitis or significant stenosis. Scattered atherosclerotic vascular calcifications. Celiac: Patent without evidence of aneurysm, dissection, vasculitis or significant stenosis. SMA: Patent without evidence of aneurysm, dissection, vasculitis or significant stenosis. Renals: Renal arteries are patent without evidence of aneurysm, dissection, vasculitis, fibromuscular dysplasia or significant stenosis. Small accessory left renal artery. IMA: Patent without evidence of aneurysm, dissection, vasculitis or significant stenosis. Inflow: Patent without evidence of aneurysm, dissection, vasculitis or significant stenosis. Proximal Outflow: Bilateral common femoral and visualized portions of the superficial and profunda femoral arteries are patent without evidence of aneurysm, dissection, vasculitis or significant stenosis. Veins: No focal venous abnormality. Review of the MIP images confirms the above findings. NON-VASCULAR Lower chest: Marked cardiomegaly. Partially imaged cardiac rhythm maintenance device with leads in the right atrium and right ventricle. Severe biatrial enlargement. Evidence of prior aortic, mitral and tricuspid valve replacement. No pericardial effusion. Subpleural reticulation, architectural distortion and honeycombing in the lung bases consistent with usual interstitial pneumonitis. Hepatobiliary: Normal hepatic contour and morphology. No discrete hepatic lesion. High attenuation stones layer  in the gallbladder lumen. No biliary ductal dilatation. Pancreas: Unremarkable. No pancreatic ductal dilatation or surrounding inflammatory changes. Spleen: No splenic injury or perisplenic hematoma. Adrenals/Urinary Tract: Adrenal glands are unremarkable. Kidneys are normal, without renal calculi, focal lesion, or hydronephrosis. Bladder is unremarkable. Stomach/Bowel: Stomach is within normal limits. Appendix appears normal. No evidence of bowel wall thickening, distention, or inflammatory changes. No evidence of arterial extravasation contrast to suggest acute lower GI bleeding. There are a few scattered colonic diverticula. Lymphatic: No significant vascular findings are present.  No enlarged abdominal or pelvic lymph nodes. Reproductive: Degenerated calcified uterine fibroids. No adnexal mass. Other: No abdominal wall hernia or abnormality. No abdominopelvic ascites. Musculoskeletal: Severe grade 2 anterolisthesis of L4 on L5 with advanced degenerative disc disease. Chronic compression fracture of the superior endplate of L3 with approximately 45% height loss. No acute fracture or malalignment. IMPRESSION: 1. No evidence of active bleeding at the time of imaging. 2. Scattered atherosclerotic vascular calcifications without aneurysm, dissection or other acute vascular abnormality. 3. Marked cardiomegaly with surgical changes of prior aortic, mitral and tricuspid valve replacement. 4. 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 5. Cholelithiasis without evidence of acute cholecystitis. 6. Scattered colonic diverticula without evidence of active bleeding or diverticulitis. 7. Degenerated calcified uterine fibroids. 8. Severe grade 2 anterolisthesis of L4 on L5 with advanced associated degenerative disc disease. 9. Chronic appearing compression fracture of the superior endplate of L3 with 45% height loss. Electronically Signed   By: Malachy Moan M.D.   On: 04/17/2023 15:41     Microbiology: No results found for this or any previous visit (from the past 240 hours).   Labs: Basic Metabolic Panel: Recent Labs  Lab 04/17/23 1405 04/18/23 0846 04/21/23 0748  NA 138 138 136  K 3.7 3.9 4.3  CL 101 102 101  CO2 26 27 25   GLUCOSE 74 77 83  BUN 20 13 19   CREATININE 1.02* 0.95 1.33*  CALCIUM 9.1 9.0 8.9   Liver Function Tests: Recent Labs  Lab 04/17/23 1405  AST 31  ALT 13  ALKPHOS 33*  BILITOT 0.8  PROT 6.9  ALBUMIN 3.2*   No results for input(s): "LIPASE", "AMYLASE" in the last 168 hours. No results for input(s): "AMMONIA" in the last 168 hours. CBC: Recent Labs  Lab 04/17/23 1405 04/17/23 1932 04/17/23 2247 04/18/23 0846 04/19/23 0558 04/20/23 0944 04/21/23 0748  WBC 6.0  --   --  6.3 7.1  --   --   HGB 10.3*   < > 10.2* 9.9* 9.0* 9.4* 8.8*  HCT 32.0*   < > 31.9* 30.8* 27.3* 28.6* 26.7*  MCV 100.3*  --   --  100.7* 99.3  --   --   PLT 134*  --   --  136* 123*  --   --    < > = values in this interval not displayed.   Cardiac Enzymes: No results for input(s): "CKTOTAL", "CKMB", "CKMBINDEX", "TROPONINI" in the last 168 hours. BNP: BNP (last 3 results) No results for input(s): "BNP" in the last 8760 hours.  ProBNP (last 3 results) No results for input(s): "PROBNP" in the last 8760 hours.  CBG: No results for input(s): "GLUCAP" in the last 168 hours.     Signed:  Ramiro Harvest MD.  [facility] 04/21/2023, 10:46 AM

## 2023-04-23 ENCOUNTER — Inpatient Hospital Stay (HOSPITAL_COMMUNITY)
Admission: EM | Admit: 2023-04-23 | Discharge: 2023-04-26 | DRG: 811 | Disposition: A | Discharge status: Home Health | Attending: Internal Medicine | Admitting: Internal Medicine

## 2023-04-23 ENCOUNTER — Emergency Department (HOSPITAL_COMMUNITY)

## 2023-04-23 ENCOUNTER — Encounter (HOSPITAL_COMMUNITY): Payer: Self-pay

## 2023-04-23 ENCOUNTER — Other Ambulatory Visit: Payer: Self-pay | Admitting: Internal Medicine

## 2023-04-23 ENCOUNTER — Other Ambulatory Visit: Payer: Self-pay

## 2023-04-23 DIAGNOSIS — E7849 Other hyperlipidemia: Secondary | ICD-10-CM | POA: Diagnosis present

## 2023-04-23 DIAGNOSIS — E039 Hypothyroidism, unspecified: Secondary | ICD-10-CM | POA: Diagnosis not present

## 2023-04-23 DIAGNOSIS — E559 Vitamin D deficiency, unspecified: Secondary | ICD-10-CM | POA: Diagnosis present

## 2023-04-23 DIAGNOSIS — K5730 Diverticulosis of large intestine without perforation or abscess without bleeding: Secondary | ICD-10-CM | POA: Diagnosis present

## 2023-04-23 DIAGNOSIS — J84112 Idiopathic pulmonary fibrosis: Secondary | ICD-10-CM | POA: Diagnosis present

## 2023-04-23 DIAGNOSIS — K219 Gastro-esophageal reflux disease without esophagitis: Secondary | ICD-10-CM | POA: Diagnosis present

## 2023-04-23 DIAGNOSIS — Z95810 Presence of automatic (implantable) cardiac defibrillator: Secondary | ICD-10-CM | POA: Diagnosis not present

## 2023-04-23 DIAGNOSIS — D509 Iron deficiency anemia, unspecified: Secondary | ICD-10-CM | POA: Diagnosis present

## 2023-04-23 DIAGNOSIS — Z9889 Other specified postprocedural states: Secondary | ICD-10-CM

## 2023-04-23 DIAGNOSIS — E785 Hyperlipidemia, unspecified: Secondary | ICD-10-CM | POA: Diagnosis present

## 2023-04-23 DIAGNOSIS — M069 Rheumatoid arthritis, unspecified: Secondary | ICD-10-CM | POA: Diagnosis present

## 2023-04-23 DIAGNOSIS — G40909 Epilepsy, unspecified, not intractable, without status epilepticus: Secondary | ICD-10-CM | POA: Diagnosis present

## 2023-04-23 DIAGNOSIS — I7300 Raynaud's syndrome without gangrene: Secondary | ICD-10-CM | POA: Diagnosis present

## 2023-04-23 DIAGNOSIS — Z884 Allergy status to anesthetic agent status: Secondary | ICD-10-CM

## 2023-04-23 DIAGNOSIS — K5731 Diverticulosis of large intestine without perforation or abscess with bleeding: Secondary | ICD-10-CM | POA: Diagnosis present

## 2023-04-23 DIAGNOSIS — R569 Unspecified convulsions: Secondary | ICD-10-CM

## 2023-04-23 DIAGNOSIS — Z8261 Family history of arthritis: Secondary | ICD-10-CM

## 2023-04-23 DIAGNOSIS — M329 Systemic lupus erythematosus, unspecified: Secondary | ICD-10-CM | POA: Diagnosis present

## 2023-04-23 DIAGNOSIS — D6832 Hemorrhagic disorder due to extrinsic circulating anticoagulants: Secondary | ICD-10-CM | POA: Diagnosis present

## 2023-04-23 DIAGNOSIS — F03A2 Unspecified dementia, mild, with psychotic disturbance: Secondary | ICD-10-CM

## 2023-04-23 DIAGNOSIS — M1A09X0 Idiopathic chronic gout, multiple sites, without tophus (tophi): Secondary | ICD-10-CM

## 2023-04-23 DIAGNOSIS — D62 Acute posthemorrhagic anemia: Principal | ICD-10-CM | POA: Diagnosis present

## 2023-04-23 DIAGNOSIS — Z888 Allergy status to other drugs, medicaments and biological substances status: Secondary | ICD-10-CM

## 2023-04-23 DIAGNOSIS — K921 Melena: Secondary | ICD-10-CM | POA: Insufficient documentation

## 2023-04-23 DIAGNOSIS — Z7952 Long term (current) use of systemic steroids: Secondary | ICD-10-CM

## 2023-04-23 DIAGNOSIS — I5042 Chronic combined systolic (congestive) and diastolic (congestive) heart failure: Secondary | ICD-10-CM | POA: Diagnosis present

## 2023-04-23 DIAGNOSIS — Z953 Presence of xenogenic heart valve: Secondary | ICD-10-CM

## 2023-04-23 DIAGNOSIS — I255 Ischemic cardiomyopathy: Secondary | ICD-10-CM | POA: Diagnosis present

## 2023-04-23 DIAGNOSIS — Z8673 Personal history of transient ischemic attack (TIA), and cerebral infarction without residual deficits: Secondary | ICD-10-CM

## 2023-04-23 DIAGNOSIS — I130 Hypertensive heart and chronic kidney disease with heart failure and stage 1 through stage 4 chronic kidney disease, or unspecified chronic kidney disease: Secondary | ICD-10-CM | POA: Diagnosis present

## 2023-04-23 DIAGNOSIS — I495 Sick sinus syndrome: Secondary | ICD-10-CM | POA: Diagnosis present

## 2023-04-23 DIAGNOSIS — N183 Chronic kidney disease, stage 3 unspecified: Secondary | ICD-10-CM | POA: Diagnosis present

## 2023-04-23 DIAGNOSIS — M1A00X0 Idiopathic chronic gout, unspecified site, without tophus (tophi): Secondary | ICD-10-CM | POA: Diagnosis present

## 2023-04-23 DIAGNOSIS — Z7901 Long term (current) use of anticoagulants: Secondary | ICD-10-CM

## 2023-04-23 DIAGNOSIS — Z8249 Family history of ischemic heart disease and other diseases of the circulatory system: Secondary | ICD-10-CM

## 2023-04-23 DIAGNOSIS — Z7951 Long term (current) use of inhaled steroids: Secondary | ICD-10-CM

## 2023-04-23 DIAGNOSIS — T45515A Adverse effect of anticoagulants, initial encounter: Secondary | ICD-10-CM | POA: Diagnosis present

## 2023-04-23 DIAGNOSIS — K922 Gastrointestinal hemorrhage, unspecified: Principal | ICD-10-CM | POA: Diagnosis present

## 2023-04-23 DIAGNOSIS — I5032 Chronic diastolic (congestive) heart failure: Secondary | ICD-10-CM | POA: Diagnosis present

## 2023-04-23 DIAGNOSIS — F03A0 Unspecified dementia, mild, without behavioral disturbance, psychotic disturbance, mood disturbance, and anxiety: Secondary | ICD-10-CM | POA: Diagnosis present

## 2023-04-23 DIAGNOSIS — K648 Other hemorrhoids: Secondary | ICD-10-CM | POA: Diagnosis present

## 2023-04-23 DIAGNOSIS — I482 Chronic atrial fibrillation, unspecified: Secondary | ICD-10-CM | POA: Diagnosis present

## 2023-04-23 DIAGNOSIS — M1A9XX0 Chronic gout, unspecified, without tophus (tophi): Secondary | ICD-10-CM | POA: Diagnosis present

## 2023-04-23 DIAGNOSIS — Z79899 Other long term (current) drug therapy: Secondary | ICD-10-CM

## 2023-04-23 DIAGNOSIS — I10 Essential (primary) hypertension: Secondary | ICD-10-CM | POA: Diagnosis present

## 2023-04-23 DIAGNOSIS — J45909 Unspecified asthma, uncomplicated: Secondary | ICD-10-CM | POA: Insufficient documentation

## 2023-04-23 DIAGNOSIS — I442 Atrioventricular block, complete: Secondary | ICD-10-CM | POA: Diagnosis present

## 2023-04-23 DIAGNOSIS — I4821 Permanent atrial fibrillation: Secondary | ICD-10-CM

## 2023-04-23 DIAGNOSIS — D849 Immunodeficiency, unspecified: Secondary | ICD-10-CM | POA: Diagnosis present

## 2023-04-23 DIAGNOSIS — Z833 Family history of diabetes mellitus: Secondary | ICD-10-CM

## 2023-04-23 DIAGNOSIS — Z952 Presence of prosthetic heart valve: Secondary | ICD-10-CM

## 2023-04-23 LAB — CBC WITH DIFFERENTIAL/PLATELET
Abs Immature Granulocytes: 0.02 10*3/uL (ref 0.00–0.07)
Basophils Absolute: 0 10*3/uL (ref 0.0–0.1)
Basophils Relative: 0 %
Eosinophils Absolute: 0.1 10*3/uL (ref 0.0–0.5)
Eosinophils Relative: 1 %
HCT: 24.4 % — ABNORMAL LOW (ref 36.0–46.0)
Hemoglobin: 7.7 g/dL — ABNORMAL LOW (ref 12.0–15.0)
Immature Granulocytes: 0 %
Lymphocytes Relative: 24 %
Lymphs Abs: 1.5 10*3/uL (ref 0.7–4.0)
MCH: 32.4 pg (ref 26.0–34.0)
MCHC: 31.6 g/dL (ref 30.0–36.0)
MCV: 102.5 fL — ABNORMAL HIGH (ref 80.0–100.0)
Monocytes Absolute: 0.6 10*3/uL (ref 0.1–1.0)
Monocytes Relative: 9 %
Neutro Abs: 4.1 10*3/uL (ref 1.7–7.7)
Neutrophils Relative %: 66 %
Platelets: 149 10*3/uL — ABNORMAL LOW (ref 150–400)
RBC: 2.38 MIL/uL — ABNORMAL LOW (ref 3.87–5.11)
RDW: 15.3 % (ref 11.5–15.5)
WBC: 6.2 10*3/uL (ref 4.0–10.5)
nRBC: 0.3 % — ABNORMAL HIGH (ref 0.0–0.2)

## 2023-04-23 LAB — CBC
HCT: 24.9 % — ABNORMAL LOW (ref 36.0–46.0)
Hemoglobin: 8 g/dL — ABNORMAL LOW (ref 12.0–15.0)
MCH: 32.3 pg (ref 26.0–34.0)
MCHC: 32.1 g/dL (ref 30.0–36.0)
MCV: 100.4 fL — ABNORMAL HIGH (ref 80.0–100.0)
Platelets: 152 10*3/uL (ref 150–400)
RBC: 2.48 MIL/uL — ABNORMAL LOW (ref 3.87–5.11)
RDW: 15.2 % (ref 11.5–15.5)
WBC: 6.7 10*3/uL (ref 4.0–10.5)
nRBC: 0 % (ref 0.0–0.2)

## 2023-04-23 LAB — COMPREHENSIVE METABOLIC PANEL WITH GFR
ALT: 18 U/L (ref 0–44)
AST: 33 U/L (ref 15–41)
Albumin: 3 g/dL — ABNORMAL LOW (ref 3.5–5.0)
Alkaline Phosphatase: 37 U/L — ABNORMAL LOW (ref 38–126)
Anion gap: 10 (ref 5–15)
BUN: 20 mg/dL (ref 8–23)
CO2: 24 mmol/L (ref 22–32)
Calcium: 8.6 mg/dL — ABNORMAL LOW (ref 8.9–10.3)
Chloride: 103 mmol/L (ref 98–111)
Creatinine, Ser: 1.19 mg/dL — ABNORMAL HIGH (ref 0.44–1.00)
GFR, Estimated: 48 mL/min — ABNORMAL LOW (ref >60)
Glucose, Bld: 85 mg/dL (ref 70–99)
Potassium: 3.7 mmol/L (ref 3.5–5.1)
Sodium: 137 mmol/L (ref 135–145)
Total Bilirubin: 0.9 mg/dL (ref 0.0–1.2)
Total Protein: 6.5 g/dL (ref 6.5–8.1)

## 2023-04-23 LAB — POC OCCULT BLOOD, ED: Fecal Occult Bld: POSITIVE — AB

## 2023-04-23 LAB — PROTIME-INR
INR: 1.3 — ABNORMAL HIGH (ref 0.8–1.2)
Prothrombin Time: 16.1 s — ABNORMAL HIGH (ref 11.4–15.2)

## 2023-04-23 LAB — APTT: aPTT: 34 s (ref 24–36)

## 2023-04-23 MED ORDER — LEVETIRACETAM 500 MG PO TABS
500.0000 mg | ORAL_TABLET | Freq: Two times a day (BID) | ORAL | Status: DC
  Administered 2023-04-23 – 2023-04-26 (×6): 500 mg via ORAL
  Filled 2023-04-23 (×6): qty 1

## 2023-04-23 MED ORDER — PANTOPRAZOLE SODIUM 40 MG PO TBEC
40.0000 mg | DELAYED_RELEASE_TABLET | Freq: Every day | ORAL | Status: DC
  Administered 2023-04-24 – 2023-04-26 (×3): 40 mg via ORAL
  Filled 2023-04-23 (×3): qty 1

## 2023-04-23 MED ORDER — NINTEDANIB ESYLATE 150 MG PO CAPS
150.0000 mg | ORAL_CAPSULE | Freq: Every day | ORAL | Status: DC
Start: 2023-04-23 — End: 2023-04-26
  Administered 2023-04-24: 150 mg via ORAL
  Filled 2023-04-23 (×3): qty 1

## 2023-04-23 MED ORDER — UMECLIDINIUM BROMIDE 62.5 MCG/ACT IN AEPB
1.0000 | INHALATION_SPRAY | Freq: Every day | RESPIRATORY_TRACT | Status: DC
  Administered 2023-04-25: 1 via RESPIRATORY_TRACT
  Filled 2023-04-23: qty 7

## 2023-04-23 MED ORDER — ALBUTEROL SULFATE (2.5 MG/3ML) 0.083% IN NEBU
3.0000 mL | INHALATION_SOLUTION | Freq: Four times a day (QID) | RESPIRATORY_TRACT | Status: DC | PRN
Start: 2023-04-23 — End: 2023-04-26

## 2023-04-23 MED ORDER — FLUTICASONE FUROATE-VILANTEROL 100-25 MCG/ACT IN AEPB
1.0000 | INHALATION_SPRAY | Freq: Every day | RESPIRATORY_TRACT | Status: DC
  Administered 2023-04-24 – 2023-04-25 (×2): 1 via RESPIRATORY_TRACT
  Filled 2023-04-23: qty 28

## 2023-04-23 MED ORDER — SODIUM CHLORIDE 0.9% FLUSH
3.0000 mL | Freq: Two times a day (BID) | INTRAVENOUS | Status: DC
  Administered 2023-04-23 – 2023-04-26 (×6): 3 mL via INTRAVENOUS

## 2023-04-23 MED ORDER — PREDNISONE 5 MG PO TABS
5.0000 mg | ORAL_TABLET | Freq: Every day | ORAL | Status: DC
  Administered 2023-04-24 – 2023-04-25 (×2): 5 mg via ORAL
  Filled 2023-04-23 (×3): qty 1

## 2023-04-23 MED ORDER — ACETAMINOPHEN 650 MG RE SUPP: 650.0000 mg | Freq: Four times a day (QID) | RECTAL | Status: DC | PRN

## 2023-04-23 MED ORDER — BUDESON-GLYCOPYRROL-FORMOTEROL 160-9-4.8 MCG/ACT IN AERO
2.0000 | INHALATION_SPRAY | Freq: Two times a day (BID) | RESPIRATORY_TRACT | Status: DC
  Filled 2023-04-23: qty 5.9

## 2023-04-23 MED ORDER — HYDROXYCHLOROQUINE SULFATE 200 MG PO TABS
200.0000 mg | ORAL_TABLET | Freq: Every day | ORAL | Status: DC
  Administered 2023-04-24 – 2023-04-26 (×3): 200 mg via ORAL
  Filled 2023-04-23 (×3): qty 1

## 2023-04-23 MED ORDER — ACETAMINOPHEN 325 MG PO TABS: 650.0000 mg | ORAL_TABLET | Freq: Four times a day (QID) | ORAL | Status: DC | PRN

## 2023-04-23 MED ORDER — IOHEXOL 350 MG/ML SOLN
75.0000 mL | Freq: Once | INTRAVENOUS | Status: AC | PRN
  Administered 2023-04-23: 75 mL via INTRAVENOUS

## 2023-04-23 MED ORDER — ALLOPURINOL 100 MG PO TABS
100.0000 mg | ORAL_TABLET | Freq: Every day | ORAL | Status: DC
  Administered 2023-04-24 – 2023-04-26 (×3): 100 mg via ORAL
  Filled 2023-04-23 (×3): qty 1

## 2023-04-23 MED ORDER — OLANZAPINE 5 MG PO TABS
2.5000 mg | ORAL_TABLET | Freq: Every day | ORAL | Status: DC
  Administered 2023-04-23 – 2023-04-25 (×3): 2.5 mg via ORAL
  Filled 2023-04-23 (×3): qty 1

## 2023-04-23 NOTE — ED Provider Notes (Signed)
 [place] EMERGENCY DEPARTMENT AT [facility] Provider Note   CSN: [id number] Arrival date & time: 04/23/23  1415     History  Chief Complaint  Patient presents with   Rectal Bleeding    Lindsey Huber is a 78 y.o. female.  With a past history of diverticular bleeding, atrial fibrillation on Eliquis and status post aortic tricuspid and mitral valve repair who presents to the ED for recurrent GI bleeding.  Patient was recently seen in the ED and admitted for active GI bleeding.  She required blood transfusion and had Eliquis reversal with Kcentra and vitamin K.  Was discharged home as bleeding was suspected to be due to chronic diverticulosis.  Returns today after having a bloody bowel movement with bright red blood noted in her brief and dark blood in her stools.  She was restarted back on her Eliquis and has been taking this medication.  Denies nausea vomiting fevers chills shortness of breath chest pain and other complaints at this time   Rectal Bleeding      Home Medications Prior to Admission medications   Medication Sig Start Date End Date Taking? Authorizing Provider  acetaminophen (TYLENOL) 500 MG tablet Take 1,000 mg by mouth every 6 (six) hours as needed (pain).    [provider]  albuterol (VENTOLIN HFA) 108 (90 Base) MCG/ACT inhaler Inhale 2 puffs into the lungs every 6 (six) hours as needed for wheezing or shortness of breath. 03/21/23   Hunsucker, Lesia Sago, MD  allopurinol (ZYLOPRIM) 100 MG tablet Take 1 tablet (100 mg total) by mouth daily. 01/05/23   Rice, Jamesetta Orleans, MD  amoxicillin (AMOXIL) 500 MG capsule 4 capsules by mouth 1 hour before dental procedure. 12/30/21   [provider]  apixaban (ELIQUIS) 2.5 MG TABS tablet Take 1 tablet (2.5 mg total) by mouth 2 (two) times daily. 04/23/23 10/20/23  Rodolph Bong, MD  Budeson-Glycopyrrol-Formoterol (BREZTRI AEROSPHERE) 160-9-4.8 MCG/ACT AERO Inhale 2 puffs into the lungs 2 (two) times  daily as needed ("for flares"). Patient taking differently: Inhale 2 puffs into the lungs daily as needed ("for flares"). 03/21/23   Hunsucker, Lesia Sago, MD  calcium-vitamin D (OSCAL WITH D) 500-5 MG-MCG tablet Take 2 tablets by mouth 2 (two) times daily. Patient taking differently: Take 1 tablet by mouth daily. 07/10/22   Elgergawy, Leana Roe, MD  cetirizine HCl (ZYRTEC) 5 MG/5ML SOLN Take 1 mL (1 mg total) by mouth 2 (two) times daily as needed for rhinitis or allergies. 04/21/23   Rodolph Bong, MD  Cholecalciferol (VITAMIN D3) 25 MCG (1000 UT) CAPS Take 1,000 Units by mouth daily with lunch.    [provider]  fluticasone (FLONASE) 50 MCG/ACT nasal spray PLACE 1 SPRAY INTO BOTH NOSTRILS 2 (TWO) TIMES DAILY Patient taking differently: Place 2 sprays into both nostrils daily. 05/11/22   Hunsucker, Lesia Sago, MD  furosemide (LASIX) 40 MG tablet Take 1 tablet (40 mg total) by mouth daily. 04/25/23   Rodolph Bong, MD  hydroxychloroquine (PLAQUENIL) 200 MG tablet Take 1 tablet (200 mg total) by mouth daily. 01/05/23   Fuller Plan, MD  levETIRAcetam (KEPPRA) 500 MG tablet Take 1 tablet (500 mg total) by mouth 2 (two) times daily. 10/27/22   Pokhrel, Rebekah Chesterfield, MD  magnesium oxide (MAG-OX) 400 (240 Mg) MG tablet TAKE 1 TABLET BY MOUTH EVERY DAY 08/25/22   Lanier Prude, MD  Nintedanib (OFEV) 150 MG CAPS Take 1 capsule (150 mg total) by mouth at bedtime. 12/13/22  Hunsucker, Lesia Sago, MD  OLANZapine (ZYPREXA) 2.5 MG tablet TAKE 1 TABLET BY MOUTH EVERYDAY AT BEDTIME 01/20/23   Jaffe, Adam R, DO  pantoprazole (PROTONIX) 40 MG tablet Take 1 tablet (40 mg total) by mouth 2 (two) times daily. Patient taking differently: Take 40 mg by mouth daily. 11/11/22 11/11/23  Regalado, Belkys A, MD  polyethylene glycol powder (MIRALAX) 17 GM/SCOOP powder Take 17 g by mouth daily as needed for mild constipation. 07/03/21   [provider]  potassium chloride (KLOR-CON M10) 10 MEQ tablet  Take 2 tablets (20 mEq total) by mouth daily. 04/25/23   Rodolph Bong, MD  predniSONE (DELTASONE) 5 MG tablet Take 1 tablet (5 mg total) by mouth daily with breakfast. Patient to double up dose during sick day rule 11/30/22   Shamleffer, Konrad Dolores, MD      Allergies    Other, Propofol, Flecainide, Amiodarone, and Amlodipine    Review of Systems   Review of Systems  Gastrointestinal:  Positive for hematochezia.    Physical Exam Updated Vital Signs BP 126/64 (BP Location: Right Arm)   Pulse 73   Temp 97.7 F (36.5 C) (Oral)   Resp 18   Ht 5\' 3"  (1.6 m)   Wt 52.6 kg   SpO2 100%   BMI 20.55 kg/m  Physical Exam Vitals and nursing note reviewed.  HENT:     Head: Normocephalic and atraumatic.  Eyes:     Pupils: Pupils are equal, round, and reactive to light.  Cardiovascular:     Rate and Rhythm: Normal rate and regular rhythm.  Pulmonary:     Effort: Pulmonary effort is normal.     Breath sounds: Normal breath sounds.  Abdominal:     Palpations: Abdomen is soft.     Tenderness: There is abdominal tenderness.     Comments: Left lower quadrant tenderness  Genitourinary:    Rectum: Normal. Guaiac result positive.  Skin:    General: Skin is warm and dry.  Neurological:     Mental Status: She is alert.  Psychiatric:        Mood and Affect: Mood normal.     ED Results / Procedures / Treatments   Labs (all labs ordered are listed, but only abnormal results are displayed) Labs Reviewed  CBC WITH DIFFERENTIAL/PLATELET - Abnormal; Notable for the following components:      Result Value   RBC 2.38 (*)    Hemoglobin 7.7 (*)    HCT 24.4 (*)    MCV 102.5 (*)    Platelets 149 (*)    nRBC 0.3 (*)    All other components within normal limits  POC OCCULT BLOOD, ED - Abnormal; Notable for the following components:   Fecal Occult Bld POSITIVE (*)    All other components within normal limits  COMPREHENSIVE METABOLIC PANEL WITH GFR  PROTIME-INR  APTT  TYPE AND SCREEN     EKG None  Radiology No results found.  Procedures Procedures    Medications Ordered in ED Medications - No data to display  ED Course/ Medical Decision Making/ A&P                                 Medical Decision Making 78 year old female with history as above returns for recurrent GI bleeding.  She was recently admitted and discharged 2 days ago for GI bleeding.  Her Eliquis was reversed and she required blood transfusion.  Bleeding suspected to be due to chronic diverticulosis.  She was restarted on Eliquis.  Returns today with recurrent bloody stools.  Hemoccult positive rectal exam.  Hemodynamically stable.  Will obtain laboratory workup including type and screen.  She does have some left lower quadrant tenderness and reports discomfort in this area which she did not have previously.  Will obtain another CTA to look for active GI bleeding.  She may require reversal again but will await her laboratory workup and CTA findings.  Readmission likely  Amount and/or Complexity of Data Reviewed Labs: ordered. Radiology: ordered.           Final Clinical Impression(s) / ED Diagnoses Final diagnoses:  Acute GI bleeding  Chronic anticoagulation    Rx / DC Orders ED Discharge Orders     None         Royanne Foots, DO 04/23/23 1522

## 2023-04-23 NOTE — H&P (Signed)
 History and Physical   Lindsey Huber ZOX:[id number] DOB: 1945-06-01 DOA: 04/23/2023  PCP: Collene Mares, PA   Patient coming from: Home  Chief Complaint: GI bleed  HPI: Lindsey Huber is a 78 y.o. female with medical history significant of hypertension, hyperlipidemia, TIA, atrial fibrillation, complete heart block status post pacemaker, GERD, CKD 3, hypothyroidism, chronic diastolic CHF, aortic valve replacement, mitral valve replacement, tricuspid valve replacement, mild dementia, seizure, asthma, pulmonary fibrosis, RA, Raynaud's, lupus, gout, anemia, diverticulosis presenting with rectal bleeding.  Patient recently admitted 3/23-3/27 with rectal bleed.  Presumed diverticular bleed, no colonoscopy performed.  GI did follow-up patient was here but no transfusion required, hemoglobin stabilized and bleeding stopped on 3/25.  GI GI bleed study looked okay on admission.  Patient's Eliquis was reversed on admission as well.  Patient was discharged with plan to resume Eliquis on 3/29.  Patient states she resumed Eliquis  just this morning.  Later in the day, she had a bloody bowel movement with some bright red blood in her brief and dark blood in her stool.  She denies fevers, chills, chest pain, shortness of breath, abdominal pain, constipation, diarrhea, nausea, vomiting.  ED Course: Vital signs in the ED notable for blood pressure in the 120s to 140s systolic.  Lab workup included CMP with creatinine stable 1.19, calcium 8.6, albumin 3.0.  CBC with hemoglobin 7.7 down from 8.82 days ago, platelets 149.  PT and INR mildly elevated at 16.1 and 1.3 respectively.  FOBT positive.  Patient typed and screened in the ED.  CT angio GI bleed study showed focal angiodysplasia at the proximal sigmoid colon and evidence of probable slow extravasation of contrast material.  Chronic changes included interstitial lung disease, gallstones, splenic lesions, diverticulosis, leiomyoma.  No initial  interventions in the ED, GI is being consulted by EDP.  Review of Systems: As per HPI otherwise all other systems reviewed and are negative.  Past Medical History:  Diagnosis Date   ABLA (acute blood loss anemia) 07/04/2022   Achalasia    Acquired hypothyroidism 08/20/2020   Acute lower GI bleeding 11/02/2022   Acute metabolic encephalopathy 07/25/2021   AICD (automatic cardioverter/defibrillator) present 11/14/2019   Allergic rhinitis    Ankle fracture 03/25/2022   Aortic valve disorder 03/26/2002   Atherosclerosis of abdominal aorta (HCC) 05/01/2020   Cardiomyopathy (HCC)    CHB (complete heart block) (HCC) 08/18/2017   CHF (congestive heart failure) (HCC)    Cholelithiasis without obstruction 05/01/2020   Chronic gouty arthritis    Chronic heart failure with preserved ejection fraction (HCC) 05/01/2020   Chronic kidney disease (CKD), active medical management without dialysis, stage 3 (moderate) (HCC) 05/01/2020   Closed torus fracture of distal end of right radius with delayed healing 08/12/2021   Delusional thoughts (HCC)    Diverticulosis of colon 05/01/2020   Epistaxis    Essential (primary) hypertension 08/18/2017   Gastroesophageal reflux disease 05/01/2020   Gastrointestinal hemorrhage    Hemorrhagic shock (HCC) 07/02/2022   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 11/14/2019   Hypercoagulability due to atrial fibrillation (HCC) 05/01/2020   Hyperlipidemia 11/14/2019   Hypocalcemia 12/14/2020   Hypoglycemia    Hypokalemia 12/14/2020   Hypomagnesemia 12/14/2020   Hypotension 12/14/2020   Idiopathic pulmonary fibrosis (HCC) 05/01/2020   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   Iron deficiency anemia    Long term (current) use of anticoagulants    Malnutrition of mild degree [name]: 75% to less than 90% of standard weight) (HCC)  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/12/2021   Mitral and aortic incompetence 11/14/2019   Non-rheumatic atrial fibrillation (HCC)  05/01/2020   Non-toxic multinodular goiter 08/20/2020   NSVT (nonsustained ventricular tachycardia) (HCC) 08/18/2017   Oropharyngeal dysphagia    Osteoarthritis of hip 05/01/2020   Osteoarthritis of knee 05/01/2020   Osteopenia of neck of left femur    Pain of left hip joint 12/12/2017   Proteinuria 05/01/2020   Raynaud's disease    Recurrent falls 04/09/2021   Rheumatoid arthritis (HCC)    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   Seizures (HCC)    Septic shock (HCC) 03/10/2020   Slow transit constipation    Systemic lupus erythematosus (HCC) 05/01/2020   Thrombophilia (HCC)    Transient ischemic attack    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   Vitamin D deficiency     Past Surgical History:  Procedure Laterality Date   BREAST BIOPSY Left    CARDIAC VALVE SURGERY     CARPAL TUNNEL RELEASE     COLONOSCOPY WITH PROPOFOL N/A 12/20/2020   Procedure: COLONOSCOPY WITH PROPOFOL;  Surgeon: Willis Modena, MD;  Location: [facility] ENDOSCOPY;  Service: Endoscopy;  Laterality: N/A;   HEMORRHOID SURGERY     PACEMAKER INSERTION     RIGHT/LEFT HEART CATH AND CORONARY ANGIOGRAPHY N/A 11/27/2020   Procedure: RIGHT/LEFT HEART CATH AND CORONARY ANGIOGRAPHY;  Surgeon: Dolores Patty, MD;  Location: [facility] INVASIVE CV LAB;  Service: Cardiovascular;  Laterality: N/A;   TONSILLECTOMY      Social History  reports that she has never smoked. She has been exposed to tobacco smoke. She has never used smokeless tobacco. She reports that she does not drink alcohol and does not use drugs.  Allergies  Allergen Reactions   Other Other (See Comments)    Patient is to NOT EAT any foods with husks or tree nuts, popcorn   Propofol Shortness Of Breath and Other (See Comments)    "Caused asthma"   Flecainide Other (See Comments)    Reaction not recalled   Amiodarone Other (See Comments)    Delirium/Confusion/Psychosis   Amlodipine Other (See Comments)     "Tired and syncope"    Family History  Problem Relation Age of Onset   Heart disease Mother    Hypertension Mother    Rheum arthritis Mother    Osteoarthritis Mother    Heart disease Father    Hypertension Father    Osteoarthritis Father    Heart disease Sister    Diabetes Brother    Cancer Brother   Reviewed on admission  Prior to Admission medications   Medication Sig Start Date End Date Taking? Authorizing Provider  acetaminophen (TYLENOL) 500 MG tablet Take 1,000 mg by mouth every 6 (six) hours as needed (pain).    [provider]  albuterol (VENTOLIN HFA) 108 (90 Base) MCG/ACT inhaler Inhale 2 puffs into the lungs every 6 (six) hours as needed for wheezing or shortness of breath. 03/21/23   Hunsucker, Lesia Sago, MD  allopurinol (ZYLOPRIM) 100 MG tablet Take 1 tablet (100 mg total) by mouth daily. 01/05/23   Rice, Jamesetta Orleans, MD  amoxicillin (AMOXIL) 500 MG capsule 4 capsules by mouth 1 hour before dental procedure. 12/30/21   [provider]  apixaban (ELIQUIS) 2.5 MG TABS tablet Take 1 tablet (2.5 mg total) by mouth 2 (two) times daily. 04/23/23 10/20/23  Rodolph Bong, MD  Budeson-Glycopyrrol-Formoterol (BREZTRI AEROSPHERE) 160-9-4.8 MCG/ACT AERO Inhale 2 puffs into the lungs 2 (two)  times daily as needed ("for flares"). Patient taking differently: Inhale 2 puffs into the lungs daily as needed ("for flares"). 03/21/23   Hunsucker, Lesia Sago, MD  calcium-vitamin D (OSCAL WITH D) 500-5 MG-MCG tablet Take 2 tablets by mouth 2 (two) times daily. Patient taking differently: Take 1 tablet by mouth daily. 07/10/22   Elgergawy, Leana Roe, MD  cetirizine HCl (ZYRTEC) 5 MG/5ML SOLN Take 1 mL (1 mg total) by mouth 2 (two) times daily as needed for rhinitis or allergies. 04/21/23   Rodolph Bong, MD  Cholecalciferol (VITAMIN D3) 25 MCG (1000 UT) CAPS Take 1,000 Units by mouth daily with lunch.    [provider]  fluticasone (FLONASE) 50 MCG/ACT nasal  spray PLACE 1 SPRAY INTO BOTH NOSTRILS 2 (TWO) TIMES DAILY Patient taking differently: Place 2 sprays into both nostrils daily. 05/11/22   Hunsucker, Lesia Sago, MD  furosemide (LASIX) 40 MG tablet Take 1 tablet (40 mg total) by mouth daily. 04/25/23   Rodolph Bong, MD  hydroxychloroquine (PLAQUENIL) 200 MG tablet Take 1 tablet (200 mg total) by mouth daily. 01/05/23   Fuller Plan, MD  levETIRAcetam (KEPPRA) 500 MG tablet Take 1 tablet (500 mg total) by mouth 2 (two) times daily. 10/27/22   Pokhrel, Rebekah Chesterfield, MD  magnesium oxide (MAG-OX) 400 (240 Mg) MG tablet TAKE 1 TABLET BY MOUTH EVERY DAY 08/25/22   Lanier Prude, MD  Nintedanib (OFEV) 150 MG CAPS Take 1 capsule (150 mg total) by mouth at bedtime. 12/13/22   Hunsucker, Lesia Sago, MD  OLANZapine (ZYPREXA) 2.5 MG tablet TAKE 1 TABLET BY MOUTH EVERYDAY AT BEDTIME 01/20/23   Jaffe, Adam R, DO  pantoprazole (PROTONIX) 40 MG tablet Take 1 tablet (40 mg total) by mouth 2 (two) times daily. Patient taking differently: Take 40 mg by mouth daily. 11/11/22 11/11/23  Regalado, Belkys A, MD  polyethylene glycol powder (MIRALAX) 17 GM/SCOOP powder Take 17 g by mouth daily as needed for mild constipation. 07/03/21   [provider]  potassium chloride (KLOR-CON M10) 10 MEQ tablet Take 2 tablets (20 mEq total) by mouth daily. 04/25/23   Rodolph Bong, MD  predniSONE (DELTASONE) 5 MG tablet Take 1 tablet (5 mg total) by mouth daily with breakfast. Patient to double up dose during sick day rule 11/30/22   Shamleffer, Konrad Dolores, MD    Physical Exam: Vitals:   04/23/23 1420 04/23/23 1715 04/23/23 1730 04/23/23 1739  BP: 126/64 (!) 146/77 (!) 140/74   Pulse: 73 74 69 70  Resp: 18 15 (!) 23 18  Temp: 97.7 F (36.5 C)     TempSrc: Oral     SpO2: 100% 100% 100% 100%  Weight:      Height:        Physical Exam Constitutional:      General: She is not in acute distress.    Appearance: Normal appearance.  HENT:     Head:  Normocephalic and atraumatic.     Mouth/Throat:     Mouth: Mucous membranes are moist.     Pharynx: Oropharynx is clear.  Eyes:     Extraocular Movements: Extraocular movements intact.     Pupils: Pupils are equal, round, and reactive to light.  Cardiovascular:     Rate and Rhythm: Normal rate and regular rhythm.     Pulses: Normal pulses.     Heart sounds: Normal heart sounds.  Pulmonary:     Effort: Pulmonary effort is normal. No respiratory distress.  Breath sounds: Normal breath sounds.  Abdominal:     General: Bowel sounds are normal. There is no distension.     Palpations: Abdomen is soft.     Tenderness: There is no abdominal tenderness.  Musculoskeletal:        General: No swelling or deformity.  Skin:    General: Skin is warm and dry.  Neurological:     General: No focal deficit present.     Mental Status: Mental status is at baseline.    Labs on Admission: I have personally reviewed following labs and imaging studies  CBC: Recent Labs  Lab 04/17/23 1405 04/17/23 1932 04/18/23 0846 04/19/23 0558 04/20/23 0944 04/21/23 0748 04/23/23 1448  WBC 6.0  --  6.3 7.1  --   --  6.2  NEUTROABS  --   --   --   --   --   --  4.1  HGB 10.3*   < > 9.9* 9.0* 9.4* 8.8* 7.7*  HCT 32.0*   < > 30.8* 27.3* 28.6* 26.7* 24.4*  MCV 100.3*  --  100.7* 99.3  --   --  102.5*  PLT 134*  --  136* 123*  --   --  149*   < > = values in this interval not displayed.    Basic Metabolic Panel: Recent Labs  Lab 04/17/23 1405 04/18/23 0846 04/21/23 0748 04/23/23 1448  NA 138 138 136 137  K 3.7 3.9 4.3 3.7  CL 101 102 101 103  CO2 26 27 25 24   GLUCOSE 74 77 83 85  BUN 20 13 19 20   CREATININE 1.02* 0.95 1.33* 1.19*  CALCIUM 9.1 9.0 8.9 8.6*    GFR: Estimated Creatinine Clearance: 33.8 mL/min (A) (by C-G formula based on SCr of 1.19 mg/dL (H)).  Liver Function Tests: Recent Labs  Lab 04/17/23 1405 04/23/23 1448  AST 31 33  ALT 13 18  ALKPHOS 33* 37*  BILITOT 0.8 0.9   PROT 6.9 6.5  ALBUMIN 3.2* 3.0*    Urine analysis:    Component Value Date/Time   COLORURINE COLORLESS (A) 10/26/2022 1710   APPEARANCEUR CLEAR 10/26/2022 1710   LABSPEC 1.004 (L) 10/26/2022 1710   PHURINE 7.0 10/26/2022 1710   GLUCOSEU NEGATIVE 10/26/2022 1710   HGBUR NEGATIVE 10/26/2022 1710   BILIRUBINUR NEGATIVE 10/26/2022 1710   KETONESUR NEGATIVE 10/26/2022 1710   PROTEINUR NEGATIVE 10/26/2022 1710   NITRITE NEGATIVE 10/26/2022 1710   LEUKOCYTESUR NEGATIVE 10/26/2022 1710    Radiological Exams on Admission: CT Angio Abd/Pel W and/or Wo Contrast Result Date: 04/23/2023 CLINICAL DATA:  Lower GI bleed.  Rectal bleeding. EXAM: CTA ABDOMEN AND PELVIS WITHOUT AND WITH CONTRAST TECHNIQUE: Multidetector CT imaging of the abdomen and pelvis was performed using the standard protocol during bolus administration of intravenous contrast. Multiplanar reconstructed images and MIPs were obtained and reviewed to evaluate the vascular anatomy. RADIATION DOSE REDUCTION: This exam was performed according to the departmental dose-optimization program which includes automated exposure control, adjustment of the mA and/or kV according to patient size and/or use of iterative reconstruction technique. CONTRAST:  75mL OMNIPAQUE IOHEXOL 350 MG/ML SOLN COMPARISON:  CT Angiography abdomen and pelvis from 04/17/2023. FINDINGS: VASCULAR Aorta: Normal caliber aorta without aneurysm, dissection, vasculitis or significant stenosis. Celiac: Mild-to-moderate atherosclerotic vascular calcifications noted at the origin. Otherwise patent without evidence of aneurysm, dissection, vasculitis or significant stenosis. SMA: Patent without evidence of aneurysm, dissection, vasculitis or significant stenosis. Renals: Both renal arteries are patent without evidence of aneurysm, dissection, vasculitis,  fibromuscular dysplasia or significant stenosis. IMA: Patent without evidence of aneurysm, dissection, vasculitis or significant  stenosis. Inflow: Patent without evidence of aneurysm, dissection, vasculitis or significant stenosis. Proximal Outflow: Bilateral common femoral and visualized portions of the superficial and profunda femoral arteries are patent without evidence of aneurysm, dissection, vasculitis or significant stenosis. Veins: No obvious venous abnormality within the limitations of this arterial phase study. Review of the MIP images confirms the above findings. NON-VASCULAR Lower chest: There are peripheral reticulations along with bronchiectasis and honeycombing in the visualized bilateral lung bases, favoring UIP pattern. Correlate clinically. No pleural effusion or lung consolidation. The heart is markedly enlarged in size. No pericardial effusion. Cardiac pacemaker leads partially seen. Hepatobiliary: The liver is normal in size. Non-cirrhotic configuration. No suspicious mass. No intrahepatic or extrahepatic bile duct dilation. Small volume sub 5 mm calcified gallstones noted without imaging signs of acute cholecystitis. Normal gallbladder wall thickness. No pericholecystic inflammatory changes. Pancreas: Unremarkable. No pancreatic ductal dilatation or surrounding inflammatory changes. Spleen: Normal size spleen. There are at least 2, subcentimeter sized hypoattenuating areas, which are incompletely characterized on the current exam but grossly similar to the prior study from 07/02/2022. Adrenals/Urinary Tract: Adrenal glands are unremarkable. Persistent fetal renal lobulations noted. No suspicious renal mass. No hydronephrosis. No renal or ureteric calculi. Urinary bladder is under distended, precluding optimal assessment. However, no large mass or stones identified. No perivesical fat stranding. Stomach/Bowel: No disproportionate dilation of the small or large bowel loops. Unremarkable appendix. There are multiple diverticula throughout the colon without diverticulitis. There are several small tortuous vessels in the  mucosa/submucosa of the colon at the junction of descending colon and sigmoid colon. The supplying artery arises from the inferior mesenteric artery. There is subtle contrast blush along the dependent portion in this region however, no significant interval increase in the amount of contrast when compared on the arterial and portal venous phase images. Findings are concerning for angiodysplasia with probable associated slow active extravasation of contrast/hemorrhage. Correlate clinically and with colonoscopy. Vascular/Lymphatic: No ascites or pneumoperitoneum. No abdominal or pelvic lymphadenopathy, by size criteria. No aneurysmal dilation of the major abdominal arteries. There are mild peripheral atherosclerotic vascular calcifications of the aorta and its major branches. Reproductive: Limited evaluation of reproductive organs on the CT scan exam. However, note is made of retroverted uterus containing multiple calcified leiomyomas. No large adnexal mass seen. Other: The visualized soft tissues and abdominal wall are unremarkable. Musculoskeletal: No suspicious osseous lesions. There are mild - moderate multilevel degenerative changes in the visualized spine. There is grade 1/2 anterolisthesis of L4 over L5. IMPRESSION: 1. 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 There is probable associated slow active extravasation of contrast. Correlate clinically and with colonoscopy. 2. Multiple other nonacute observations (such as usual interstitial pneumonia in the visualized lung bases, cholelithiasis without acute cholecystitis, indeterminate hypoattenuating splenic lesions, colonic diverticula without diverticulitis, multiple calcified uterine leiomyomas, etc.), as described above. Aortic Atherosclerosis (ICD10-I70.0). Electronically Signed   By: Jules Schick M.D.   On: 04/23/2023 16:59   EKG: Independently reviewed.  Ventricular  paced rhythm at 74 bpm.  Assessment/Plan Principal Problem:   Acute GI bleeding Active Problems:   Essential (primary) hypertension   Gastroesophageal reflux disease   Idiopathic pulmonary fibrosis   Systemic lupus erythematosus   Chronic heart failure with preserved ejection fraction   Chronic kidney disease (CKD), active medical management without dialysis, stage 3 (moderate)  Diverticulosis of colon   Acquired hypothyroidism   AICD (automatic cardioverter/defibrillator) present   CHB (complete heart block)   Chronic gouty arthritis   Iron deficiency anemia   Hyperlipidemia   Raynaud's disease   Rheumatoid arthritis   History of TIA (transient ischemic attack)   Mild dementia   Seizure (HCC)   Chronic a-fib (HCC)   S/P AVR   S/P TVR (tricuspid valve repair)   S/P mitral valve repair   Asthma, chronic   GI bleed History of diverticulosis > Patient presenting with dark blood in bowel movement and bright red blood in brief today. > Recent admission for presumed diverticular bleed 3/23-3/27.  Eliquis reversed on admission, CT angio at that time stable, hemoglobin stabilized, no transfusion, discharged with plan to restart Eliquis today. > Patient restarted Eliquis this morning.  Hemoglobin now 7.7 down from 8.8 two days ago.  CT angio GI bleed today shows probable slow extravasation of contrast material at the proximal sigmoid colon. > GI has been consulted by EDP.  Type and screen performed in the ED. - Monitor on telemetry - Appreciate GI recommendations and assistance - Trend CBC, transfuse hemoglobin less than 7 - Clear liquid diet - Hold Eliquis - Supportive care  Hypertension - Holding home Lasix in the setting of active bleed  Hyperlipidemia - Not currently on antiantihyperlipidemic  History of TIA - Holding Eliquis as above  Atrial fibrillation Complete heart block - Holding Eliquis as above - Paced rhythm  GERD - Continue home PPI  CKD 3 >  Creatinine stable in the ED at 1.19. - Trend renal function and electrolytes  Hypothyroidism - Not currently on any Synthroid  Chronic diastolic CHF Status post bioprosthetic AVR, MVR, TVR > Last echo was in October showed EF of 60-65%, indeterminate diastolic function, moderately reduced RV function.  Bioprosthetic aortic valve not well visualized.  Moderate tricuspid regurgitation, moderate to severe tricuspid stenosis, trivial mitral valve regurgitation. - Holding Lasix as above  Mild dementia - Continue Zyprexa nightly  History of seizure - Continue home Keppra  Asthma - Continue home Breztri and as needed albuterol  Pulmonary fibrosis - Continue home nintedanib  Rheumatoid arthritis Raynaud's Lupus - Continue home hydroxychloroquine and prednisone  Gout - Continue allopurinol  DVT prophylaxis: SCDs Code Status:   Full Family Communication:  Daughter updated by phone Disposition Plan:   Patient is from:  Home  Anticipated DC to:  Home  Anticipated DC date:  1 to 3 days  Anticipated DC barriers: None  Consults called:  Gastroenterology Admission status:  Observation, telemetry  Severity of Illness: The appropriate patient status for this patient is OBSERVATION. Observation status is judged to be reasonable and necessary in order to provide the required intensity of service to ensure the patient's safety. The patient's presenting symptoms, physical exam findings, and initial radiographic and laboratory data in the context of their medical condition is felt to place them at decreased risk for further clinical deterioration. Furthermore, it is anticipated that the patient will be medically stable for discharge from the hospital within 2 midnights of admission.    Synetta Fail MD [facility]  How to contact the [facility] Attending or Consulting provider 7A - 7P or covering provider during after hours 7P -7A, for this patient?   Check the care team in [facility] and  look for a) attending/consulting [facility] provider listed and b) the [facility] team listed Log into [email or web address] and use [place]'s universal password to access. If you do  not have the password, please contact the hospital operator. Locate the [facility] provider you are looking for under [facility] and page to a number that you can be directly reached. If you still have difficulty reaching the provider, please page the [facility] (Director on Call) for the Hospitalists listed on amion for assistance.  04/23/2023, 5:48 PM

## 2023-04-23 NOTE — ED Triage Notes (Signed)
 Pt to ED via [facility] from home. Pt has blood in stool that started a few hours ago. Pt does take eliquis. Pt did have abdominal pain, none now.  EMS VSS.

## 2023-04-24 DIAGNOSIS — D849 Immunodeficiency, unspecified: Secondary | ICD-10-CM | POA: Diagnosis present

## 2023-04-24 DIAGNOSIS — I130 Hypertensive heart and chronic kidney disease with heart failure and stage 1 through stage 4 chronic kidney disease, or unspecified chronic kidney disease: Secondary | ICD-10-CM | POA: Diagnosis present

## 2023-04-24 DIAGNOSIS — N183 Chronic kidney disease, stage 3 unspecified: Secondary | ICD-10-CM | POA: Diagnosis present

## 2023-04-24 DIAGNOSIS — I482 Chronic atrial fibrillation, unspecified: Secondary | ICD-10-CM | POA: Diagnosis present

## 2023-04-24 DIAGNOSIS — K625 Hemorrhage of anus and rectum: Secondary | ICD-10-CM | POA: Diagnosis present

## 2023-04-24 DIAGNOSIS — I495 Sick sinus syndrome: Secondary | ICD-10-CM | POA: Diagnosis present

## 2023-04-24 DIAGNOSIS — M329 Systemic lupus erythematosus, unspecified: Secondary | ICD-10-CM | POA: Diagnosis present

## 2023-04-24 DIAGNOSIS — I7300 Raynaud's syndrome without gangrene: Secondary | ICD-10-CM | POA: Diagnosis present

## 2023-04-24 DIAGNOSIS — M1A9XX0 Chronic gout, unspecified, without tophus (tophi): Secondary | ICD-10-CM | POA: Diagnosis present

## 2023-04-24 DIAGNOSIS — J84112 Idiopathic pulmonary fibrosis: Secondary | ICD-10-CM | POA: Diagnosis present

## 2023-04-24 DIAGNOSIS — Z95810 Presence of automatic (implantable) cardiac defibrillator: Secondary | ICD-10-CM | POA: Diagnosis not present

## 2023-04-24 DIAGNOSIS — E785 Hyperlipidemia, unspecified: Secondary | ICD-10-CM | POA: Diagnosis present

## 2023-04-24 DIAGNOSIS — I5042 Chronic combined systolic (congestive) and diastolic (congestive) heart failure: Secondary | ICD-10-CM | POA: Diagnosis present

## 2023-04-24 DIAGNOSIS — K5731 Diverticulosis of large intestine without perforation or abscess with bleeding: Secondary | ICD-10-CM | POA: Diagnosis present

## 2023-04-24 DIAGNOSIS — D62 Acute posthemorrhagic anemia: Secondary | ICD-10-CM | POA: Diagnosis present

## 2023-04-24 DIAGNOSIS — D6832 Hemorrhagic disorder due to extrinsic circulating anticoagulants: Secondary | ICD-10-CM | POA: Diagnosis present

## 2023-04-24 DIAGNOSIS — K648 Other hemorrhoids: Secondary | ICD-10-CM | POA: Diagnosis not present

## 2023-04-24 DIAGNOSIS — J45909 Unspecified asthma, uncomplicated: Secondary | ICD-10-CM | POA: Diagnosis present

## 2023-04-24 DIAGNOSIS — K920 Hematemesis: Secondary | ICD-10-CM | POA: Diagnosis not present

## 2023-04-24 DIAGNOSIS — I255 Ischemic cardiomyopathy: Secondary | ICD-10-CM | POA: Diagnosis present

## 2023-04-24 DIAGNOSIS — F03A0 Unspecified dementia, mild, without behavioral disturbance, psychotic disturbance, mood disturbance, and anxiety: Secondary | ICD-10-CM | POA: Diagnosis present

## 2023-04-24 DIAGNOSIS — K922 Gastrointestinal hemorrhage, unspecified: Secondary | ICD-10-CM

## 2023-04-24 DIAGNOSIS — K5730 Diverticulosis of large intestine without perforation or abscess without bleeding: Secondary | ICD-10-CM | POA: Diagnosis not present

## 2023-04-24 DIAGNOSIS — E039 Hypothyroidism, unspecified: Secondary | ICD-10-CM | POA: Diagnosis present

## 2023-04-24 DIAGNOSIS — K219 Gastro-esophageal reflux disease without esophagitis: Secondary | ICD-10-CM | POA: Diagnosis present

## 2023-04-24 DIAGNOSIS — G40909 Epilepsy, unspecified, not intractable, without status epilepticus: Secondary | ICD-10-CM | POA: Diagnosis present

## 2023-04-24 DIAGNOSIS — M069 Rheumatoid arthritis, unspecified: Secondary | ICD-10-CM | POA: Diagnosis present

## 2023-04-24 DIAGNOSIS — I442 Atrioventricular block, complete: Secondary | ICD-10-CM | POA: Diagnosis present

## 2023-04-24 DIAGNOSIS — Z953 Presence of xenogenic heart valve: Secondary | ICD-10-CM | POA: Diagnosis not present

## 2023-04-24 DIAGNOSIS — I110 Hypertensive heart disease with heart failure: Secondary | ICD-10-CM | POA: Diagnosis not present

## 2023-04-24 LAB — CBC
HCT: 22.6 % — ABNORMAL LOW (ref 36.0–46.0)
HCT: 28.6 % — ABNORMAL LOW (ref 36.0–46.0)
Hemoglobin: 7.4 g/dL — ABNORMAL LOW (ref 12.0–15.0)
Hemoglobin: 9.3 g/dL — ABNORMAL LOW (ref 12.0–15.0)
MCH: 32.2 pg (ref 26.0–34.0)
MCH: 31.8 pg (ref 26.0–34.0)
MCHC: 32.7 g/dL (ref 30.0–36.0)
MCHC: 32.5 g/dL (ref 30.0–36.0)
MCV: 98.3 fL (ref 80.0–100.0)
MCV: 97.9 fL (ref 80.0–100.0)
Platelets: 152 10*3/uL (ref 150–400)
Platelets: 154 10*3/uL (ref 150–400)
RBC: 2.3 MIL/uL — ABNORMAL LOW (ref 3.87–5.11)
RBC: 2.92 MIL/uL — ABNORMAL LOW (ref 3.87–5.11)
RDW: 15.2 % (ref 11.5–15.5)
RDW: 15.9 % — ABNORMAL HIGH (ref 11.5–15.5)
WBC: 6.1 10*3/uL (ref 4.0–10.5)
WBC: 6.9 10*3/uL (ref 4.0–10.5)
nRBC: 0.3 % — ABNORMAL HIGH (ref 0.0–0.2)
nRBC: 0 % (ref 0.0–0.2)

## 2023-04-24 LAB — COMPREHENSIVE METABOLIC PANEL WITH GFR
ALT: 16 U/L (ref 0–44)
AST: 27 U/L (ref 15–41)
Albumin: 2.7 g/dL — ABNORMAL LOW (ref 3.5–5.0)
Alkaline Phosphatase: 31 U/L — ABNORMAL LOW (ref 38–126)
Anion gap: 6 (ref 5–15)
BUN: 15 mg/dL (ref 8–23)
CO2: 25 mmol/L (ref 22–32)
Calcium: 8.2 mg/dL — ABNORMAL LOW (ref 8.9–10.3)
Chloride: 105 mmol/L (ref 98–111)
Creatinine, Ser: 0.99 mg/dL (ref 0.44–1.00)
GFR, Estimated: 59 mL/min — ABNORMAL LOW (ref >60)
Glucose, Bld: 72 mg/dL (ref 70–99)
Potassium: 3.5 mmol/L (ref 3.5–5.1)
Sodium: 136 mmol/L (ref 135–145)
Total Bilirubin: 1 mg/dL (ref 0.0–1.2)
Total Protein: 5.9 g/dL — ABNORMAL LOW (ref 6.5–8.1)

## 2023-04-24 LAB — PREPARE RBC (CROSSMATCH)

## 2023-04-24 MED ORDER — SODIUM CHLORIDE 0.9% IV SOLUTION: Freq: Once | INTRAVENOUS | Status: DC

## 2023-04-24 MED ORDER — FUROSEMIDE 10 MG/ML IJ SOLN
20.0000 mg | Freq: Once | INTRAMUSCULAR | Status: AC
  Administered 2023-04-24: 20 mg via INTRAVENOUS
  Filled 2023-04-24: qty 2

## 2023-04-24 MED ORDER — PEG 3350-KCL-NA BICARB-NACL 420 G PO SOLR
4000.0000 mL | Freq: Once | ORAL | Status: AC
  Administered 2023-04-24: 4000 mL via ORAL
  Filled 2023-04-24: qty 4000

## 2023-04-24 MED ORDER — DIPHENHYDRAMINE HCL 25 MG PO CAPS
25.0000 mg | ORAL_CAPSULE | Freq: Once | ORAL | Status: AC
  Administered 2023-04-24: 25 mg via ORAL
  Filled 2023-04-24: qty 1

## 2023-04-24 MED ORDER — SODIUM CHLORIDE 0.9 % IV SOLN: INTRAVENOUS | Status: AC

## 2023-04-24 MED ORDER — ACETAMINOPHEN 325 MG PO TABS
650.0000 mg | ORAL_TABLET | Freq: Once | ORAL | Status: AC
  Administered 2023-04-24: 650 mg via ORAL
  Filled 2023-04-24: qty 2

## 2023-04-24 NOTE — Plan of Care (Signed)
 Patient resting well throughout night.  X2 assist to [facility] bloody stool 1 occurrence.  Patient understands care plan.

## 2023-04-24 NOTE — Care Management Obs Status (Signed)
 MEDICARE OBSERVATION STATUS NOTIFICATION   Patient Details  Name: Lindsey Huber MRN: [id number] Date of Birth: 1945/11/16   Medicare Observation Status Notification Given:  Yes    Lawerance Sabal, RN 04/24/2023, 2:39 PM

## 2023-04-24 NOTE — Consult Note (Signed)
 Referring Provider: Dr. Jerral Ralph Primary Care Physician:  Collene Mares, [name] Primary Gastroenterologist:  Dr. Dulce Sellar  Reason for Consultation:  GI bleed  HPI: Lindsey Huber is a 78 y.o. female with multiple medical problems as stated below who was hospitalized last week for a GI bleed and medically managed. Bleeding at that time was thought to be diverticular and was resolving and she was discharged home on 3/27. Her Eliquis was held during that hospitalization and it was resumed the morning of 3/29 and that afternoon she had an episode of rectal bleeding and came back to the hospital. No bleeding since that episode. CTA showed slow active bleeding at the junction of the descending colon and sigmoid colon thought to be an angiodysplasia. Reports mild LLQ pain yesterday but none now. Denies associated dizziness, or lightheadedness. Hgb 7.4 (8 on 3/29 and 8.8 at d/c on 3/27). Colonoscopy (11/22) showed left-sided diverticulosis and hemorrhoids.   Past Medical History:  Diagnosis Date   ABLA (acute blood loss anemia) 07/04/2022   Achalasia    Acquired hypothyroidism 08/20/2020   Acute lower GI bleeding 11/02/2022   Acute metabolic encephalopathy 07/25/2021   AICD (automatic cardioverter/defibrillator) present 11/14/2019   Allergic rhinitis    Ankle fracture 03/25/2022   Aortic valve disorder 03/26/2002   Atherosclerosis of abdominal aorta (HCC) 05/01/2020   Cardiomyopathy (HCC)    CHB (complete heart block) (HCC) 08/18/2017   CHF (congestive heart failure) (HCC)    Cholelithiasis without obstruction 05/01/2020   Chronic gouty arthritis    Chronic heart failure with preserved ejection fraction (HCC) 05/01/2020   Chronic kidney disease (CKD), active medical management without dialysis, stage 3 (moderate) (HCC) 05/01/2020   Closed torus fracture of distal end of right radius with delayed healing 08/12/2021   Delusional thoughts (HCC)    Diverticulosis of colon 05/01/2020   Epistaxis     Essential (primary) hypertension 08/18/2017   Gastroesophageal reflux disease 05/01/2020   Gastrointestinal hemorrhage    Hemorrhagic shock (HCC) 07/02/2022   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 11/14/2019   Hypercoagulability due to atrial fibrillation (HCC) 05/01/2020   Hyperlipidemia 11/14/2019   Hypocalcemia 12/14/2020   Hypoglycemia    Hypokalemia 12/14/2020   Hypomagnesemia 12/14/2020   Hypotension 12/14/2020   Idiopathic pulmonary fibrosis (HCC) 05/01/2020   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   Iron deficiency anemia    Long term (current) use of anticoagulants    Malnutrition of mild degree [name]: 75% to less than 90% of standard weight) (HCC)    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/12/2021   Mitral and aortic incompetence 11/14/2019   Non-rheumatic atrial fibrillation (HCC) 05/01/2020   Non-toxic multinodular goiter 08/20/2020   NSVT (nonsustained ventricular tachycardia) (HCC) 08/18/2017   Oropharyngeal dysphagia    Osteoarthritis of hip 05/01/2020   Osteoarthritis of knee 05/01/2020   Osteopenia of neck of left femur    Pain of left hip joint 12/12/2017   Proteinuria 05/01/2020   Raynaud's disease    Recurrent falls 04/09/2021   Rheumatoid arthritis (HCC)    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   Seizures (HCC)    Septic shock (HCC) 03/10/2020   Slow transit constipation    Systemic lupus erythematosus (HCC) 05/01/2020   Thrombophilia (HCC)    Transient ischemic attack    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   Vitamin D deficiency     Past Surgical History:  Procedure Laterality Date   BREAST BIOPSY Left    CARDIAC VALVE SURGERY  CARPAL TUNNEL RELEASE     COLONOSCOPY WITH PROPOFOL N/A 12/20/2020   Procedure: COLONOSCOPY WITH PROPOFOL;  Surgeon: Willis Modena, MD;  Location: [facility] ENDOSCOPY;  Service: Endoscopy;  Laterality: N/A;   HEMORRHOID SURGERY     PACEMAKER INSERTION      RIGHT/LEFT HEART CATH AND CORONARY ANGIOGRAPHY N/A 11/27/2020   Procedure: RIGHT/LEFT HEART CATH AND CORONARY ANGIOGRAPHY;  Surgeon: Dolores Patty, MD;  Location: [facility] INVASIVE CV LAB;  Service: Cardiovascular;  Laterality: N/A;   TONSILLECTOMY      Prior to Admission medications   Medication Sig Start Date End Date Taking? Authorizing Provider  acetaminophen (TYLENOL) 500 MG tablet Take 1,000 mg by mouth every 6 (six) hours as needed (pain).   Yes [provider]  albuterol (VENTOLIN HFA) 108 (90 Base) MCG/ACT inhaler Inhale 2 puffs into the lungs every 6 (six) hours as needed for wheezing or shortness of breath. 03/21/23  Yes Hunsucker, Lesia Sago, MD  allopurinol (ZYLOPRIM) 100 MG tablet Take 1 tablet (100 mg total) by mouth daily. 01/05/23  Yes Rice, Jamesetta Orleans, MD  amoxicillin (AMOXIL) 500 MG capsule 4 capsules by mouth 1 hour before dental procedure. 12/30/21  Yes [provider]  apixaban (ELIQUIS) 2.5 MG TABS tablet Take 1 tablet (2.5 mg total) by mouth 2 (two) times daily. 04/23/23 10/20/23 Yes Rodolph Bong, MD  Budeson-Glycopyrrol-Formoterol (BREZTRI AEROSPHERE) 160-9-4.8 MCG/ACT AERO Inhale 2 puffs into the lungs 2 (two) times daily as needed ("for flares"). Patient taking differently: Inhale 2 puffs into the lungs daily as needed ("for flares"). 03/21/23  Yes Hunsucker, Lesia Sago, MD  calcium-vitamin D (OSCAL WITH D) 500-5 MG-MCG tablet Take 2 tablets by mouth 2 (two) times daily. Patient taking differently: Take 1 tablet by mouth daily. 07/10/22  Yes Elgergawy, Leana Roe, MD  Cholecalciferol (VITAMIN D3) 25 MCG (1000 UT) CAPS Take 1,000 Units by mouth daily with lunch.   Yes [provider]  fluticasone (FLONASE) 50 MCG/ACT nasal spray PLACE 1 SPRAY INTO BOTH NOSTRILS 2 (TWO) TIMES DAILY 05/11/22  Yes Hunsucker, Lesia Sago, MD  furosemide (LASIX) 40 MG tablet Take 1 tablet (40 mg total) by mouth daily. 04/25/23  Yes Rodolph Bong, MD   hydroxychloroquine (PLAQUENIL) 200 MG tablet Take 1 tablet (200 mg total) by mouth daily. 01/05/23  Yes Rice, Jamesetta Orleans, MD  levETIRAcetam (KEPPRA) 500 MG tablet Take 1 tablet (500 mg total) by mouth 2 (two) times daily. 10/27/22  Yes Pokhrel, Laxman, MD  magnesium oxide (MAG-OX) 400 (240 Mg) MG tablet TAKE 1 TABLET BY MOUTH EVERY DAY 08/25/22  Yes Lanier Prude, MD  Nintedanib (OFEV) 150 MG CAPS Take 1 capsule (150 mg total) by mouth at bedtime. 12/13/22  Yes Hunsucker, Lesia Sago, MD  OLANZapine (ZYPREXA) 2.5 MG tablet TAKE 1 TABLET BY MOUTH EVERYDAY AT BEDTIME 01/20/23  Yes Jaffe, Adam R, DO  pantoprazole (PROTONIX) 40 MG tablet Take 1 tablet (40 mg total) by mouth 2 (two) times daily. Patient taking differently: Take 40 mg by mouth daily. 11/11/22 11/11/23 Yes Regalado, Belkys A, MD  polyethylene glycol powder (MIRALAX) 17 GM/SCOOP powder Take 17 g by mouth daily as needed for mild constipation. 07/03/21  Yes [provider]  potassium chloride (KLOR-CON M10) 10 MEQ tablet Take 2 tablets (20 mEq total) by mouth daily. 04/25/23  Yes Rodolph Bong, MD  predniSONE (DELTASONE) 5 MG tablet Take 1 tablet (5 mg total) by mouth daily with breakfast. Patient to double up dose during sick day  rule 11/30/22  Yes Shamleffer, Konrad Dolores, MD  cetirizine HCl (ZYRTEC) 5 MG/5ML SOLN Take 1 mL (1 mg total) by mouth 2 (two) times daily as needed for rhinitis or allergies. Patient not taking: Reported on 04/23/2023 04/21/23   Rodolph Bong, MD    Scheduled Meds:  sodium chloride   Intravenous Once   allopurinol  100 mg Oral Daily   fluticasone furoate-vilanterol  1 puff Inhalation Daily   And   umeclidinium bromide  1 puff Inhalation Daily   furosemide  20 mg Intravenous Once   hydroxychloroquine  200 mg Oral Daily   levETIRAcetam  500 mg Oral BID   Nintedanib  150 mg Oral QHS   OLANZapine  2.5 mg Oral QHS   pantoprazole  40 mg Oral Daily   polyethylene glycol-electrolytes  4,000  mL Oral Once   predniSONE  5 mg Oral Q breakfast   sodium chloride flush  3 mL Intravenous Q12H   Continuous Infusions:  sodium chloride     PRN Meds:.acetaminophen **OR** acetaminophen, albuterol  Allergies as of 04/23/2023 - Review Complete 04/23/2023  Allergen Reaction Noted   Diprivan [propofol] Shortness Of Breath and Other (See Comments) 02/06/2007   Other Other (See Comments) 12/26/2021   Tambocor [flecainide] Other (See Comments) 06/19/2020   Norvasc [amlodipine] Other (See Comments) 09/01/2016   Pacerone [amiodarone] Other (See Comments) 09/03/2015    Family History  Problem Relation Age of Onset   Heart disease Mother    Hypertension Mother    Rheum arthritis Mother    Osteoarthritis Mother    Heart disease Father    Hypertension Father    Osteoarthritis Father    Heart disease Sister    Diabetes Brother    Cancer Brother     Social History   Socioeconomic History   Marital status: Legally Separated    Spouse name: Not on file   Number of children: Not on file   Years of education: 16   Highest education level: Bachelor's degree (e.g., BA, AB, BS)  Occupational History   Occupation: Retired    Comment: Community education officer business  Tobacco Use   Smoking status: Never    Passive exposure: Past   Smokeless tobacco: Never  Vaping Use   Vaping status: Never Used  Substance and Sexual Activity   Alcohol use: Never   Drug use: Never   Sexual activity: Not Currently  Other Topics Concern   Not on file  Social History Narrative   Right Handed    Social Drivers of Health   Financial Resource Strain: Not on file  Food Insecurity: No Food Insecurity (04/23/2023)   Hunger Vital Sign    Worried About Running Out of Food in the Last Year: Never true    Ran Out of Food in the Last Year: Never true  Transportation Needs: No Transportation Needs (04/23/2023)   PRAPARE - Administrator, Civil Service (Medical): No    Lack of Transportation (Non-Medical): No   Physical Activity: Not on file  Stress: Not on file  Social Connections: Moderately Isolated (04/23/2023)   Social Connection and Isolation Panel [NHANES]    Frequency of Communication with Friends and Family: More than three times a week    Frequency of Social Gatherings with Friends and Family: Three times a week    Attends Religious Services: More than 4 times per year    Active Member of Clubs or Organizations: No    Attends Banker Meetings: Never  Marital Status: Separated  Intimate Partner Violence: Not At Risk (04/23/2023)   Humiliation, Afraid, Rape, and Kick questionnaire    Fear of Current or Ex-Partner: No    Emotionally Abused: No    Physically Abused: No    Sexually Abused: No    Review of Systems: All negative except as stated above in HPI.  Physical Exam: Vital signs: Vitals:   04/24/23 1010 04/24/23 1050  BP: (!) 117/55 (!) 128/55  Pulse: 70 71  Resp: (!) 29 (!) 23  Temp: 98.7 F (37.1 C) 98.1 F (36.7 C)  SpO2: 97% 97%   Last BM Date : 04/23/23 General:  Lethargic, elderly, thin, no acute distress, pleasant   Head: normocephalic, atraumatic Eyes: anicteric sclera ENT: oropharynx clear Neck: supple, nontender Lungs:  Clear throughout to auscultation.   No wheezes, crackles, or rhonchi. No acute distress. Heart:  Regular rate and rhythm; no murmurs, clicks, rubs,  or gallops. Abdomen: soft, nontender, nondistended, +BS  Rectal:  Deferred Ext: no edema  GI:  Lab Results: Recent Labs    04/23/23 1448 04/23/23 2033 04/24/23 0409  WBC 6.2 6.7 6.1  HGB 7.7* 8.0* 7.4*  HCT 24.4* 24.9* 22.6*  PLT 149* 152 152   BMET Recent Labs    04/23/23 1448 04/24/23 0409  NA 137 136  K 3.7 3.5  CL 103 105  CO2 24 25  GLUCOSE 85 72  BUN 20 15  CREATININE 1.19* 0.99  CALCIUM 8.6* 8.2*   LFT Recent Labs    04/24/23 0409  PROT 5.9*  ALBUMIN 2.7*  AST 27  ALT 16  ALKPHOS 31*  BILITOT 1.0   PT/INR Recent Labs    04/23/23 1448   LABPROT 16.1*  INR 1.3*     Studies/Results: CT Angio Abd/Pel W and/or Wo Contrast Result Date: 04/23/2023 CLINICAL DATA:  Lower GI bleed.  Rectal bleeding. EXAM: CTA ABDOMEN AND PELVIS WITHOUT AND WITH CONTRAST TECHNIQUE: Multidetector CT imaging of the abdomen and pelvis was performed using the standard protocol during bolus administration of intravenous contrast. Multiplanar reconstructed images and MIPs were obtained and reviewed to evaluate the vascular anatomy. RADIATION DOSE REDUCTION: This exam was performed according to the departmental dose-optimization program which includes automated exposure control, adjustment of the mA and/or kV according to patient size and/or use of iterative reconstruction technique. CONTRAST:  75mL OMNIPAQUE IOHEXOL 350 MG/ML SOLN COMPARISON:  CT Angiography abdomen and pelvis from 04/17/2023. FINDINGS: VASCULAR Aorta: Normal caliber aorta without aneurysm, dissection, vasculitis or significant stenosis. Celiac: Mild-to-moderate atherosclerotic vascular calcifications noted at the origin. Otherwise patent without evidence of aneurysm, dissection, vasculitis or significant stenosis. SMA: Patent without evidence of aneurysm, dissection, vasculitis or significant stenosis. Renals: Both renal arteries are patent without evidence of aneurysm, dissection, vasculitis, fibromuscular dysplasia or significant stenosis. IMA: Patent without evidence of aneurysm, dissection, vasculitis or significant stenosis. Inflow: Patent without evidence of aneurysm, dissection, vasculitis or significant stenosis. Proximal Outflow: Bilateral common femoral and visualized portions of the superficial and profunda femoral arteries are patent without evidence of aneurysm, dissection, vasculitis or significant stenosis. Veins: No obvious venous abnormality within the limitations of this arterial phase study. Review of the MIP images confirms the above findings. NON-VASCULAR Lower chest: There are  peripheral reticulations along with bronchiectasis and honeycombing in the visualized bilateral lung bases, favoring UIP pattern. Correlate clinically. No pleural effusion or lung consolidation. The heart is markedly enlarged in size. No pericardial effusion. Cardiac pacemaker leads partially seen. Hepatobiliary: The liver is normal in size.  Non-cirrhotic configuration. No suspicious mass. No intrahepatic or extrahepatic bile duct dilation. Small volume sub 5 mm calcified gallstones noted without imaging signs of acute cholecystitis. Normal gallbladder wall thickness. No pericholecystic inflammatory changes. Pancreas: Unremarkable. No pancreatic ductal dilatation or surrounding inflammatory changes. Spleen: Normal size spleen. There are at least 2, subcentimeter sized hypoattenuating areas, which are incompletely characterized on the current exam but grossly similar to the prior study from 07/02/2022. Adrenals/Urinary Tract: Adrenal glands are unremarkable. Persistent fetal renal lobulations noted. No suspicious renal mass. No hydronephrosis. No renal or ureteric calculi. Urinary bladder is under distended, precluding optimal assessment. However, no large mass or stones identified. No perivesical fat stranding. Stomach/Bowel: No disproportionate dilation of the small or large bowel loops. Unremarkable appendix. There are multiple diverticula throughout the colon without diverticulitis. There are several small tortuous vessels in the mucosa/submucosa of the colon at the junction of descending colon and sigmoid colon. The supplying artery arises from the inferior mesenteric artery. There is subtle contrast blush along the dependent portion in this region however, no significant interval increase in the amount of contrast when compared on the arterial and portal venous phase images. Findings are concerning for angiodysplasia with probable associated slow active extravasation of contrast/hemorrhage. Correlate clinically  and with colonoscopy. Vascular/Lymphatic: No ascites or pneumoperitoneum. No abdominal or pelvic lymphadenopathy, by size criteria. No aneurysmal dilation of the major abdominal arteries. There are mild peripheral atherosclerotic vascular calcifications of the aorta and its major branches. Reproductive: Limited evaluation of reproductive organs on the CT scan exam. However, note is made of retroverted uterus containing multiple calcified leiomyomas. No large adnexal mass seen. Other: The visualized soft tissues and abdominal wall are unremarkable. Musculoskeletal: No suspicious osseous lesions. There are mild - moderate multilevel degenerative changes in the visualized spine. There is grade 1/2 anterolisthesis of L4 over L5. IMPRESSION: 1. 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 There is probable associated slow active extravasation of contrast. Correlate clinically and with colonoscopy. 2. Multiple other nonacute observations (such as usual interstitial pneumonia in the visualized lung bases, cholelithiasis without acute cholecystitis, indeterminate hypoattenuating splenic lesions, colonic diverticula without diverticulitis, multiple calcified uterine leiomyomas, etc.), as described above. Aortic Atherosclerosis (ICD10-I70.0). Electronically Signed   By: Jules Schick M.D.   On: 04/23/2023 16:59    Impression/Plan: Lower GI bleed likely due to diverticulosis more so than an AVM. CTA shows slow extravasation from junction of descending colon and sigmoid colon. Hgb 7.4. Last dose of Eliquis yesterday morning after holding since 3/23 during last week's hospitalization. No further bleeding since yesterday so doubt embolization by IR will be able to pinpoint the site. Not showing signs of active bleeding to warrant an emergent colonoscopy. Colon prep today and updated colonoscopy tomorrow morning by Dr. Dulce Sellar. Continue to hold  Eliquis. Clear liquid diet. NPO p MN. Supportive care.    LOS: 0 days   [name]  04/24/2023, 12:00 PM  Questions please call [phone]

## 2023-04-24 NOTE — H&P (View-Only) (Signed)
 Referring Provider: Dr. Jerral Ralph Primary Care Physician:  Collene Mares, [name] Primary Gastroenterologist:  Dr. Dulce Sellar  Reason for Consultation:  GI bleed  HPI: Lindsey Huber is a 78 y.o. female with multiple medical problems as stated below who was hospitalized last week for a GI bleed and medically managed. Bleeding at that time was thought to be diverticular and was resolving and she was discharged home on 3/27. Her Eliquis was held during that hospitalization and it was resumed the morning of 3/29 and that afternoon she had an episode of rectal bleeding and came back to the hospital. No bleeding since that episode. CTA showed slow active bleeding at the junction of the descending colon and sigmoid colon thought to be an angiodysplasia. Reports mild LLQ pain yesterday but none now. Denies associated dizziness, or lightheadedness. Hgb 7.4 (8 on 3/29 and 8.8 at d/c on 3/27). Colonoscopy (11/22) showed left-sided diverticulosis and hemorrhoids.   Past Medical History:  Diagnosis Date   ABLA (acute blood loss anemia) 07/04/2022   Achalasia    Acquired hypothyroidism 08/20/2020   Acute lower GI bleeding 11/02/2022   Acute metabolic encephalopathy 07/25/2021   AICD (automatic cardioverter/defibrillator) present 11/14/2019   Allergic rhinitis    Ankle fracture 03/25/2022   Aortic valve disorder 03/26/2002   Atherosclerosis of abdominal aorta (HCC) 05/01/2020   Cardiomyopathy (HCC)    CHB (complete heart block) (HCC) 08/18/2017   CHF (congestive heart failure) (HCC)    Cholelithiasis without obstruction 05/01/2020   Chronic gouty arthritis    Chronic heart failure with preserved ejection fraction (HCC) 05/01/2020   Chronic kidney disease (CKD), active medical management without dialysis, stage 3 (moderate) (HCC) 05/01/2020   Closed torus fracture of distal end of right radius with delayed healing 08/12/2021   Delusional thoughts (HCC)    Diverticulosis of colon 05/01/2020   Epistaxis     Essential (primary) hypertension 08/18/2017   Gastroesophageal reflux disease 05/01/2020   Gastrointestinal hemorrhage    Hemorrhagic shock (HCC) 07/02/2022   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 11/14/2019   Hypercoagulability due to atrial fibrillation (HCC) 05/01/2020   Hyperlipidemia 11/14/2019   Hypocalcemia 12/14/2020   Hypoglycemia    Hypokalemia 12/14/2020   Hypomagnesemia 12/14/2020   Hypotension 12/14/2020   Idiopathic pulmonary fibrosis (HCC) 05/01/2020   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   Iron deficiency anemia    Long term (current) use of anticoagulants    Malnutrition of mild degree [name]: 75% to less than 90% of standard weight) (HCC)    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/12/2021   Mitral and aortic incompetence 11/14/2019   Non-rheumatic atrial fibrillation (HCC) 05/01/2020   Non-toxic multinodular goiter 08/20/2020   NSVT (nonsustained ventricular tachycardia) (HCC) 08/18/2017   Oropharyngeal dysphagia    Osteoarthritis of hip 05/01/2020   Osteoarthritis of knee 05/01/2020   Osteopenia of neck of left femur    Pain of left hip joint 12/12/2017   Proteinuria 05/01/2020   Raynaud's disease    Recurrent falls 04/09/2021   Rheumatoid arthritis (HCC)    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   Seizures (HCC)    Septic shock (HCC) 03/10/2020   Slow transit constipation    Systemic lupus erythematosus (HCC) 05/01/2020   Thrombophilia (HCC)    Transient ischemic attack    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   Vitamin D deficiency     Past Surgical History:  Procedure Laterality Date   BREAST BIOPSY Left    CARDIAC VALVE SURGERY  CARPAL TUNNEL RELEASE     COLONOSCOPY WITH PROPOFOL N/A 12/20/2020   Procedure: COLONOSCOPY WITH PROPOFOL;  Surgeon: Willis Modena, MD;  Location: [facility] ENDOSCOPY;  Service: Endoscopy;  Laterality: N/A;   HEMORRHOID SURGERY     PACEMAKER INSERTION      RIGHT/LEFT HEART CATH AND CORONARY ANGIOGRAPHY N/A 11/27/2020   Procedure: RIGHT/LEFT HEART CATH AND CORONARY ANGIOGRAPHY;  Surgeon: Dolores Patty, MD;  Location: [facility] INVASIVE CV LAB;  Service: Cardiovascular;  Laterality: N/A;   TONSILLECTOMY      Prior to Admission medications   Medication Sig Start Date End Date Taking? Authorizing Provider  acetaminophen (TYLENOL) 500 MG tablet Take 1,000 mg by mouth every 6 (six) hours as needed (pain).   Yes [provider]  albuterol (VENTOLIN HFA) 108 (90 Base) MCG/ACT inhaler Inhale 2 puffs into the lungs every 6 (six) hours as needed for wheezing or shortness of breath. 03/21/23  Yes Hunsucker, Lesia Sago, MD  allopurinol (ZYLOPRIM) 100 MG tablet Take 1 tablet (100 mg total) by mouth daily. 01/05/23  Yes Rice, Jamesetta Orleans, MD  amoxicillin (AMOXIL) 500 MG capsule 4 capsules by mouth 1 hour before dental procedure. 12/30/21  Yes [provider]  apixaban (ELIQUIS) 2.5 MG TABS tablet Take 1 tablet (2.5 mg total) by mouth 2 (two) times daily. 04/23/23 10/20/23 Yes Rodolph Bong, MD  Budeson-Glycopyrrol-Formoterol (BREZTRI AEROSPHERE) 160-9-4.8 MCG/ACT AERO Inhale 2 puffs into the lungs 2 (two) times daily as needed ("for flares"). Patient taking differently: Inhale 2 puffs into the lungs daily as needed ("for flares"). 03/21/23  Yes Hunsucker, Lesia Sago, MD  calcium-vitamin D (OSCAL WITH D) 500-5 MG-MCG tablet Take 2 tablets by mouth 2 (two) times daily. Patient taking differently: Take 1 tablet by mouth daily. 07/10/22  Yes Elgergawy, Leana Roe, MD  Cholecalciferol (VITAMIN D3) 25 MCG (1000 UT) CAPS Take 1,000 Units by mouth daily with lunch.   Yes [provider]  fluticasone (FLONASE) 50 MCG/ACT nasal spray PLACE 1 SPRAY INTO BOTH NOSTRILS 2 (TWO) TIMES DAILY 05/11/22  Yes Hunsucker, Lesia Sago, MD  furosemide (LASIX) 40 MG tablet Take 1 tablet (40 mg total) by mouth daily. 04/25/23  Yes Rodolph Bong, MD   hydroxychloroquine (PLAQUENIL) 200 MG tablet Take 1 tablet (200 mg total) by mouth daily. 01/05/23  Yes Rice, Jamesetta Orleans, MD  levETIRAcetam (KEPPRA) 500 MG tablet Take 1 tablet (500 mg total) by mouth 2 (two) times daily. 10/27/22  Yes Pokhrel, Laxman, MD  magnesium oxide (MAG-OX) 400 (240 Mg) MG tablet TAKE 1 TABLET BY MOUTH EVERY DAY 08/25/22  Yes Lanier Prude, MD  Nintedanib (OFEV) 150 MG CAPS Take 1 capsule (150 mg total) by mouth at bedtime. 12/13/22  Yes Hunsucker, Lesia Sago, MD  OLANZapine (ZYPREXA) 2.5 MG tablet TAKE 1 TABLET BY MOUTH EVERYDAY AT BEDTIME 01/20/23  Yes Jaffe, Adam R, DO  pantoprazole (PROTONIX) 40 MG tablet Take 1 tablet (40 mg total) by mouth 2 (two) times daily. Patient taking differently: Take 40 mg by mouth daily. 11/11/22 11/11/23 Yes Regalado, Belkys A, MD  polyethylene glycol powder (MIRALAX) 17 GM/SCOOP powder Take 17 g by mouth daily as needed for mild constipation. 07/03/21  Yes [provider]  potassium chloride (KLOR-CON M10) 10 MEQ tablet Take 2 tablets (20 mEq total) by mouth daily. 04/25/23  Yes Rodolph Bong, MD  predniSONE (DELTASONE) 5 MG tablet Take 1 tablet (5 mg total) by mouth daily with breakfast. Patient to double up dose during sick day  rule 11/30/22  Yes Shamleffer, Konrad Dolores, MD  cetirizine HCl (ZYRTEC) 5 MG/5ML SOLN Take 1 mL (1 mg total) by mouth 2 (two) times daily as needed for rhinitis or allergies. Patient not taking: Reported on 04/23/2023 04/21/23   Rodolph Bong, MD    Scheduled Meds:  sodium chloride   Intravenous Once   allopurinol  100 mg Oral Daily   fluticasone furoate-vilanterol  1 puff Inhalation Daily   And   umeclidinium bromide  1 puff Inhalation Daily   furosemide  20 mg Intravenous Once   hydroxychloroquine  200 mg Oral Daily   levETIRAcetam  500 mg Oral BID   Nintedanib  150 mg Oral QHS   OLANZapine  2.5 mg Oral QHS   pantoprazole  40 mg Oral Daily   polyethylene glycol-electrolytes  4,000  mL Oral Once   predniSONE  5 mg Oral Q breakfast   sodium chloride flush  3 mL Intravenous Q12H   Continuous Infusions:  sodium chloride     PRN Meds:.acetaminophen **OR** acetaminophen, albuterol  Allergies as of 04/23/2023 - Review Complete 04/23/2023  Allergen Reaction Noted   Diprivan [propofol] Shortness Of Breath and Other (See Comments) 02/06/2007   Other Other (See Comments) 12/26/2021   Tambocor [flecainide] Other (See Comments) 06/19/2020   Norvasc [amlodipine] Other (See Comments) 09/01/2016   Pacerone [amiodarone] Other (See Comments) 09/03/2015    Family History  Problem Relation Age of Onset   Heart disease Mother    Hypertension Mother    Rheum arthritis Mother    Osteoarthritis Mother    Heart disease Father    Hypertension Father    Osteoarthritis Father    Heart disease Sister    Diabetes Brother    Cancer Brother     Social History   Socioeconomic History   Marital status: Legally Separated    Spouse name: Not on file   Number of children: Not on file   Years of education: 16   Highest education level: Bachelor's degree (e.g., BA, AB, BS)  Occupational History   Occupation: Retired    Comment: Community education officer business  Tobacco Use   Smoking status: Never    Passive exposure: Past   Smokeless tobacco: Never  Vaping Use   Vaping status: Never Used  Substance and Sexual Activity   Alcohol use: Never   Drug use: Never   Sexual activity: Not Currently  Other Topics Concern   Not on file  Social History Narrative   Right Handed    Social Drivers of Health   Financial Resource Strain: Not on file  Food Insecurity: No Food Insecurity (04/23/2023)   Hunger Vital Sign    Worried About Running Out of Food in the Last Year: Never true    Ran Out of Food in the Last Year: Never true  Transportation Needs: No Transportation Needs (04/23/2023)   PRAPARE - Administrator, Civil Service (Medical): No    Lack of Transportation (Non-Medical): No   Physical Activity: Not on file  Stress: Not on file  Social Connections: Moderately Isolated (04/23/2023)   Social Connection and Isolation Panel [NHANES]    Frequency of Communication with Friends and Family: More than three times a week    Frequency of Social Gatherings with Friends and Family: Three times a week    Attends Religious Services: More than 4 times per year    Active Member of Clubs or Organizations: No    Attends Banker Meetings: Never  Marital Status: Separated  Intimate Partner Violence: Not At Risk (04/23/2023)   Humiliation, Afraid, Rape, and Kick questionnaire    Fear of Current or Ex-Partner: No    Emotionally Abused: No    Physically Abused: No    Sexually Abused: No    Review of Systems: All negative except as stated above in HPI.  Physical Exam: Vital signs: Vitals:   04/24/23 1010 04/24/23 1050  BP: (!) 117/55 (!) 128/55  Pulse: 70 71  Resp: (!) 29 (!) 23  Temp: 98.7 F (37.1 C) 98.1 F (36.7 C)  SpO2: 97% 97%   Last BM Date : 04/23/23 General:  Lethargic, elderly, thin, no acute distress, pleasant   Head: normocephalic, atraumatic Eyes: anicteric sclera ENT: oropharynx clear Neck: supple, nontender Lungs:  Clear throughout to auscultation.   No wheezes, crackles, or rhonchi. No acute distress. Heart:  Regular rate and rhythm; no murmurs, clicks, rubs,  or gallops. Abdomen: soft, nontender, nondistended, +BS  Rectal:  Deferred Ext: no edema  GI:  Lab Results: Recent Labs    04/23/23 1448 04/23/23 2033 04/24/23 0409  WBC 6.2 6.7 6.1  HGB 7.7* 8.0* 7.4*  HCT 24.4* 24.9* 22.6*  PLT 149* 152 152   BMET Recent Labs    04/23/23 1448 04/24/23 0409  NA 137 136  K 3.7 3.5  CL 103 105  CO2 24 25  GLUCOSE 85 72  BUN 20 15  CREATININE 1.19* 0.99  CALCIUM 8.6* 8.2*   LFT Recent Labs    04/24/23 0409  PROT 5.9*  ALBUMIN 2.7*  AST 27  ALT 16  ALKPHOS 31*  BILITOT 1.0   PT/INR Recent Labs    04/23/23 1448   LABPROT 16.1*  INR 1.3*     Studies/Results: CT Angio Abd/Pel W and/or Wo Contrast Result Date: 04/23/2023 CLINICAL DATA:  Lower GI bleed.  Rectal bleeding. EXAM: CTA ABDOMEN AND PELVIS WITHOUT AND WITH CONTRAST TECHNIQUE: Multidetector CT imaging of the abdomen and pelvis was performed using the standard protocol during bolus administration of intravenous contrast. Multiplanar reconstructed images and MIPs were obtained and reviewed to evaluate the vascular anatomy. RADIATION DOSE REDUCTION: This exam was performed according to the departmental dose-optimization program which includes automated exposure control, adjustment of the mA and/or kV according to patient size and/or use of iterative reconstruction technique. CONTRAST:  75mL OMNIPAQUE IOHEXOL 350 MG/ML SOLN COMPARISON:  CT Angiography abdomen and pelvis from 04/17/2023. FINDINGS: VASCULAR Aorta: Normal caliber aorta without aneurysm, dissection, vasculitis or significant stenosis. Celiac: Mild-to-moderate atherosclerotic vascular calcifications noted at the origin. Otherwise patent without evidence of aneurysm, dissection, vasculitis or significant stenosis. SMA: Patent without evidence of aneurysm, dissection, vasculitis or significant stenosis. Renals: Both renal arteries are patent without evidence of aneurysm, dissection, vasculitis, fibromuscular dysplasia or significant stenosis. IMA: Patent without evidence of aneurysm, dissection, vasculitis or significant stenosis. Inflow: Patent without evidence of aneurysm, dissection, vasculitis or significant stenosis. Proximal Outflow: Bilateral common femoral and visualized portions of the superficial and profunda femoral arteries are patent without evidence of aneurysm, dissection, vasculitis or significant stenosis. Veins: No obvious venous abnormality within the limitations of this arterial phase study. Review of the MIP images confirms the above findings. NON-VASCULAR Lower chest: There are  peripheral reticulations along with bronchiectasis and honeycombing in the visualized bilateral lung bases, favoring UIP pattern. Correlate clinically. No pleural effusion or lung consolidation. The heart is markedly enlarged in size. No pericardial effusion. Cardiac pacemaker leads partially seen. Hepatobiliary: The liver is normal in size.  Non-cirrhotic configuration. No suspicious mass. No intrahepatic or extrahepatic bile duct dilation. Small volume sub 5 mm calcified gallstones noted without imaging signs of acute cholecystitis. Normal gallbladder wall thickness. No pericholecystic inflammatory changes. Pancreas: Unremarkable. No pancreatic ductal dilatation or surrounding inflammatory changes. Spleen: Normal size spleen. There are at least 2, subcentimeter sized hypoattenuating areas, which are incompletely characterized on the current exam but grossly similar to the prior study from 07/02/2022. Adrenals/Urinary Tract: Adrenal glands are unremarkable. Persistent fetal renal lobulations noted. No suspicious renal mass. No hydronephrosis. No renal or ureteric calculi. Urinary bladder is under distended, precluding optimal assessment. However, no large mass or stones identified. No perivesical fat stranding. Stomach/Bowel: No disproportionate dilation of the small or large bowel loops. Unremarkable appendix. There are multiple diverticula throughout the colon without diverticulitis. There are several small tortuous vessels in the mucosa/submucosa of the colon at the junction of descending colon and sigmoid colon. The supplying artery arises from the inferior mesenteric artery. There is subtle contrast blush along the dependent portion in this region however, no significant interval increase in the amount of contrast when compared on the arterial and portal venous phase images. Findings are concerning for angiodysplasia with probable associated slow active extravasation of contrast/hemorrhage. Correlate clinically  and with colonoscopy. Vascular/Lymphatic: No ascites or pneumoperitoneum. No abdominal or pelvic lymphadenopathy, by size criteria. No aneurysmal dilation of the major abdominal arteries. There are mild peripheral atherosclerotic vascular calcifications of the aorta and its major branches. Reproductive: Limited evaluation of reproductive organs on the CT scan exam. However, note is made of retroverted uterus containing multiple calcified leiomyomas. No large adnexal mass seen. Other: The visualized soft tissues and abdominal wall are unremarkable. Musculoskeletal: No suspicious osseous lesions. There are mild - moderate multilevel degenerative changes in the visualized spine. There is grade 1/2 anterolisthesis of L4 over L5. IMPRESSION: 1. 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 There is probable associated slow active extravasation of contrast. Correlate clinically and with colonoscopy. 2. Multiple other nonacute observations (such as usual interstitial pneumonia in the visualized lung bases, cholelithiasis without acute cholecystitis, indeterminate hypoattenuating splenic lesions, colonic diverticula without diverticulitis, multiple calcified uterine leiomyomas, etc.), as described above. Aortic Atherosclerosis (ICD10-I70.0). Electronically Signed   By: Jules Schick M.D.   On: 04/23/2023 16:59    Impression/Plan: Lower GI bleed likely due to diverticulosis more so than an AVM. CTA shows slow extravasation from junction of descending colon and sigmoid colon. Hgb 7.4. Last dose of Eliquis yesterday morning after holding since 3/23 during last week's hospitalization. No further bleeding since yesterday so doubt embolization by IR will be able to pinpoint the site. Not showing signs of active bleeding to warrant an emergent colonoscopy. Colon prep today and updated colonoscopy tomorrow morning by Dr. Dulce Sellar. Continue to hold  Eliquis. Clear liquid diet. NPO p MN. Supportive care.    LOS: 0 days   [name]  04/24/2023, 12:00 PM  Questions please call [phone]

## 2023-04-24 NOTE — Progress Notes (Signed)
 PROGRESS NOTE        PATIENT DETAILS Name: Lindsey Huber Age: 78 y.o. Sex: female Date of Birth: [birth year] Admit Date: 04/23/2023 Admitting Physician Synetta Fail, MD ZOX:WRUEAV, Deloria Lair, [name]  Brief Summary: Patient is a 78 y.o.  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    Significant events: 3/29>> admit to [facility]  Significant studies: 3/29>> CTA GI bleed: Focal angiodysplasia in the junction of descending colon/proximal sigmoid colon-associated with active extravasation of contrast.  Significant microbiology data: None  Procedures: None  Consults: GI  Subjective: Lying comfortably in bed-denies any chest pain or shortness of breath.  Last hematochezia was around 10 PM yesterday.  Objective: Vitals: Blood pressure (!) 115/58, pulse 70, temperature 98.2 F (36.8 C), temperature source Oral, resp. rate (!) 29, height 5\' 3"  (1.6 m), weight 52.6 kg, SpO2 95%.   Exam: Gen Exam:Alert awake-not in any distress HEENT:atraumatic, normocephalic Chest: B/L clear to auscultation anteriorly CVS:S1S2 regular Abdomen:soft non tender, non distended Extremities:no edema Neurology: Non focal Skin: no rash  Pertinent Labs/Radiology:    Latest Ref Rng & Units 04/24/2023    4:09 AM 04/23/2023    8:33 PM 04/23/2023    2:48 PM  CBC  WBC 4.0 - 10.5 K/uL 6.1  6.7  6.2   Hemoglobin 12.0 - 15.0 g/dL 7.4  8.0  7.7   Hematocrit 36.0 - 46.0 % 22.6  24.9  24.4   Platelets 150 - 400 K/uL 152  152  149     Lab Results  Component Value Date   NA 136 04/24/2023   K 3.5 04/24/2023   CL 105 04/24/2023   CO2 25 04/24/2023       Assessment/Plan: Lower GI bleed-presumed diverticular etiology with acute blood loss anemia in the setting of anticoagulation with Eliquis (Eliquis held on prior discharge but resumed on 3/29 per patient) Last hematochezia 10 PM-hemoglobin down to 7.4 Given numerous cardiac issues-transfuse 1 unit of PRBC Discussed CTA findings with GI MD Dr. Melene Plan off on IR eval at this point-GI planning colonoscopy in the next several days. Continue to hold Eliquis Follow CBC.  Chronic atrial fibrillation Telemetry monitoring She did resume Eliquis on 3/29-currently on hold.  May need to hold Eliquis for a longer.  If time before resumption.  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 Supportive care  SLE Stable-continue prednisone/Plaquenil  ILD Continue antifibrotic's  Bronchial asthma Not in exacerbation Continue bronchodilators  Seizure disorder Continue Keppra  BMI: Estimated body mass index is 20.55 kg/m as calculated from the following:   Height as of this encounter: 5\' 3"  (1.6 m).   Weight as of this encounter: 52.6 kg.   Code status:   Code Status: Full Code   DVT Prophylaxis: SCDs Start: 04/23/23 1740   Family Communication: None at bedside   Disposition Plan: Status is: Observation The patient will require care spanning > 2 midnights and should be moved to inpatient because: Severity of illness  Planned Discharge Destination:Home   Diet: Diet Order             Diet clear liquid Fluid consistency: Thin  Diet effective now                     Antimicrobial agents: Anti-infectives (From admission, onward)    Start     Dose/Rate Route Frequency Ordered Stop   04/24/23 1000  hydroxychloroquine (PLAQUENIL)  tablet 200 mg        200 mg Oral Daily 04/23/23 1745          MEDICATIONS: Scheduled Meds:  allopurinol  100 mg Oral Daily   fluticasone furoate-vilanterol  1 puff Inhalation Daily   And   umeclidinium bromide  1 puff Inhalation Daily   hydroxychloroquine  200 mg Oral Daily   levETIRAcetam  500 mg Oral BID   Nintedanib  150 mg Oral QHS   OLANZapine  2.5 mg Oral QHS   pantoprazole  40 mg Oral Daily   predniSONE  5 mg Oral Q breakfast   sodium chloride flush  3 mL Intravenous Q12H   Continuous Infusions: PRN Meds:.acetaminophen **OR** acetaminophen, albuterol   I have personally reviewed following labs and imaging studies  LABORATORY DATA: CBC: Recent Labs  Lab 04/18/23 0846 04/19/23 0558 04/20/23 0944 04/21/23 0748 04/23/23 1448 04/23/23 2033 04/24/23 0409  WBC 6.3 7.1  --   --  6.2 6.7 6.1  NEUTROABS  --   --   --   --  4.1  --   --   HGB 9.9* 9.0* 9.4* 8.8* 7.7* 8.0* 7.4*  HCT 30.8* 27.3* 28.6* 26.7* 24.4* 24.9* 22.6*  MCV 100.7* 99.3  --   --  102.5* 100.4* 98.3  PLT 136* 123*  --   --  149* 152 152    Basic Metabolic Panel: Recent Labs  Lab 04/17/23 1405 04/18/23 0846 04/21/23 0748 04/23/23 1448 04/24/23 0409  NA 138 138 136 137 136  K 3.7 3.9 4.3 3.7 3.5  CL 101 102 101 103 105  CO2 26 27 25 24 25   GLUCOSE 74 77 83 85 72  BUN 20 13 19 20 15   CREATININE 1.02* 0.95 1.33* 1.19* 0.99  CALCIUM 9.1 9.0 8.9 8.6* 8.2*    GFR: Estimated Creatinine Clearance: 40.6 mL/min (by C-G formula based on SCr of 0.99 mg/dL).  Liver Function Tests: Recent Labs  Lab 04/17/23 1405 04/23/23 1448 04/24/23 0409  AST 31 33 27  ALT 13 18 16   ALKPHOS 33* 37* 31*  BILITOT 0.8 0.9 1.0  PROT 6.9 6.5 5.9*  ALBUMIN 3.2* 3.0* 2.7*   No results for input(s): "LIPASE", "AMYLASE" in the last 168 hours. No results for input(s): "AMMONIA" in the last 168 hours.  Coagulation Profile: Recent Labs  Lab 04/23/23 1448  INR 1.3*    Cardiac Enzymes: No results for  input(s): "CKTOTAL", "CKMB", "CKMBINDEX", "TROPONINI" in the last 168 hours.  BNP (last 3 results) No results for input(s): "PROBNP" in the last 8760 hours.  Lipid Profile: No results for input(s): "CHOL", "HDL", "LDLCALC", "TRIG", "CHOLHDL", "LDLDIRECT" in the last 72 hours.  Thyroid Function Tests: No results for input(s): "TSH", "T4TOTAL", "FREET4", "T3FREE", "THYROIDAB" in the last 72 hours.  Anemia Panel: No results for input(s): "VITAMINB12", "FOLATE", "FERRITIN", "TIBC", "IRON", "RETICCTPCT" in the last 72 hours.  Urine analysis:    Component Value Date/Time   COLORURINE COLORLESS (A) 10/26/2022 1710   APPEARANCEUR CLEAR 10/26/2022 1710   LABSPEC 1.004 (L)  10/26/2022 1710   PHURINE 7.0 10/26/2022 1710   GLUCOSEU NEGATIVE 10/26/2022 1710   HGBUR NEGATIVE 10/26/2022 1710   BILIRUBINUR NEGATIVE 10/26/2022 1710   KETONESUR NEGATIVE 10/26/2022 1710   PROTEINUR NEGATIVE 10/26/2022 1710   NITRITE NEGATIVE 10/26/2022 1710   LEUKOCYTESUR NEGATIVE 10/26/2022 1710    Sepsis Labs: Lactic Acid, Venous    Component Value Date/Time   LATICACIDVEN 1.9 04/17/2023 2247    MICROBIOLOGY: No results found for this or any previous visit (from the past 240 hours).  RADIOLOGY STUDIES/RESULTS: CT Angio Abd/Pel W and/or Wo Contrast Result Date: 04/23/2023 CLINICAL DATA:  Lower GI bleed.  Rectal bleeding. EXAM: CTA ABDOMEN AND PELVIS WITHOUT AND WITH CONTRAST TECHNIQUE: Multidetector CT imaging of the abdomen and pelvis was performed using the standard protocol during bolus administration of intravenous contrast. Multiplanar reconstructed images and MIPs were obtained and reviewed to evaluate the vascular anatomy. RADIATION DOSE REDUCTION: This exam was performed according to the departmental dose-optimization program which includes automated exposure control, adjustment of the mA and/or kV according to patient size and/or use of iterative reconstruction technique. CONTRAST:  75mL OMNIPAQUE  IOHEXOL 350 MG/ML SOLN COMPARISON:  CT Angiography abdomen and pelvis from 04/17/2023. FINDINGS: VASCULAR Aorta: Normal caliber aorta without aneurysm, dissection, vasculitis or significant stenosis. Celiac: Mild-to-moderate atherosclerotic vascular calcifications noted at the origin. Otherwise patent without evidence of aneurysm, dissection, vasculitis or significant stenosis. SMA: Patent without evidence of aneurysm, dissection, vasculitis or significant stenosis. Renals: Both renal arteries are patent without evidence of aneurysm, dissection, vasculitis, fibromuscular dysplasia or significant stenosis. IMA: Patent without evidence of aneurysm, dissection, vasculitis or significant stenosis. Inflow: Patent without evidence of aneurysm, dissection, vasculitis or significant stenosis. Proximal Outflow: Bilateral common femoral and visualized portions of the superficial and profunda femoral arteries are patent without evidence of aneurysm, dissection, vasculitis or significant stenosis. Veins: No obvious venous abnormality within the limitations of this arterial phase study. Review of the MIP images confirms the above findings. NON-VASCULAR Lower chest: There are peripheral reticulations along with bronchiectasis and honeycombing in the visualized bilateral lung bases, favoring UIP pattern. Correlate clinically. No pleural effusion or lung consolidation. The heart is markedly enlarged in size. No pericardial effusion. Cardiac pacemaker leads partially seen. Hepatobiliary: The liver is normal in size. Non-cirrhotic configuration. No suspicious mass. No intrahepatic or extrahepatic bile duct dilation. Small volume sub 5 mm calcified gallstones noted without imaging signs of acute cholecystitis. Normal gallbladder wall thickness. No pericholecystic inflammatory changes. Pancreas: Unremarkable. No pancreatic ductal dilatation or surrounding inflammatory changes. Spleen: Normal size spleen. There are at least 2,  subcentimeter sized hypoattenuating areas, which are incompletely characterized on the current exam but grossly similar to the prior study from 07/02/2022. Adrenals/Urinary Tract: Adrenal glands are unremarkable. Persistent fetal renal lobulations noted. No suspicious renal mass. No hydronephrosis. No renal or ureteric calculi. Urinary bladder is under distended, precluding optimal assessment. However, no large mass or stones identified. No perivesical fat stranding. Stomach/Bowel: No disproportionate dilation of the small or large bowel loops. Unremarkable appendix. There are multiple diverticula throughout the colon without diverticulitis. There are several small tortuous vessels in the mucosa/submucosa of the colon at the junction of descending colon and sigmoid colon. The supplying artery arises from the inferior mesenteric artery. There is subtle contrast blush along the dependent portion in this region however, no significant interval increase in the amount of contrast when compared on the arterial and portal venous phase images. Findings are concerning for angiodysplasia with probable associated slow active extravasation of  contrast/hemorrhage. Correlate clinically and with colonoscopy. Vascular/Lymphatic: No ascites or pneumoperitoneum. No abdominal or pelvic lymphadenopathy, by size criteria. No aneurysmal dilation of the major abdominal arteries. There are mild peripheral atherosclerotic vascular calcifications of the aorta and its major branches. Reproductive: Limited evaluation of reproductive organs on the CT scan exam. However, note is made of retroverted uterus containing multiple calcified leiomyomas. No large adnexal mass seen. Other: The visualized soft tissues and abdominal wall are unremarkable. Musculoskeletal: No suspicious osseous lesions. There are mild - moderate multilevel degenerative changes in the visualized spine. There is grade 1/2 anterolisthesis of L4 over L5. IMPRESSION: 1. Focal  angiodysplasia in the colon at the junction of descending colon and proximal sigmoid colon, as described in detail above. There is probable associated slow active extravasation of contrast. Correlate clinically and with colonoscopy. 2. Multiple other nonacute observations (such as usual interstitial pneumonia in the visualized lung bases, cholelithiasis without acute cholecystitis, indeterminate hypoattenuating splenic lesions, colonic diverticula without diverticulitis, multiple calcified uterine leiomyomas, etc.), as described above. Aortic Atherosclerosis (ICD10-I70.0). Electronically Signed   By: Jules Schick M.D.   On: 04/23/2023 16:59     LOS: 0 days   Jeoffrey Massed, MD  [facility]    To contact the attending provider between 7A-7P or the covering provider during after hours 7P-7A, please log into the web site [email or web address] and access using universal [place] password for that web site. If you do not have the password, please call the hospital operator.  04/24/2023, 8:54 AM

## 2023-04-25 DIAGNOSIS — K922 Gastrointestinal hemorrhage, unspecified: Secondary | ICD-10-CM | POA: Diagnosis not present

## 2023-04-25 DIAGNOSIS — I442 Atrioventricular block, complete: Secondary | ICD-10-CM | POA: Diagnosis not present

## 2023-04-25 DIAGNOSIS — J45909 Unspecified asthma, uncomplicated: Secondary | ICD-10-CM | POA: Diagnosis not present

## 2023-04-25 DIAGNOSIS — E039 Hypothyroidism, unspecified: Secondary | ICD-10-CM | POA: Diagnosis not present

## 2023-04-25 LAB — CBC
HCT: 24.5 % — ABNORMAL LOW (ref 36.0–46.0)
HCT: 26.2 % — ABNORMAL LOW (ref 36.0–46.0)
Hemoglobin: 8.1 g/dL — ABNORMAL LOW (ref 12.0–15.0)
Hemoglobin: 8.5 g/dL — ABNORMAL LOW (ref 12.0–15.0)
MCH: 31.8 pg (ref 26.0–34.0)
MCH: 31.8 pg (ref 26.0–34.0)
MCHC: 33.1 g/dL (ref 30.0–36.0)
MCHC: 32.4 g/dL (ref 30.0–36.0)
MCV: 96.1 fL (ref 80.0–100.0)
MCV: 98.1 fL (ref 80.0–100.0)
Platelets: 158 10*3/uL (ref 150–400)
Platelets: 174 10*3/uL (ref 150–400)
RBC: 2.55 MIL/uL — ABNORMAL LOW (ref 3.87–5.11)
RBC: 2.67 MIL/uL — ABNORMAL LOW (ref 3.87–5.11)
RDW: 15.8 % — ABNORMAL HIGH (ref 11.5–15.5)
RDW: 15.7 % — ABNORMAL HIGH (ref 11.5–15.5)
WBC: 4.7 10*3/uL (ref 4.0–10.5)
WBC: 5.7 10*3/uL (ref 4.0–10.5)
nRBC: 0 % (ref 0.0–0.2)
nRBC: 0 % (ref 0.0–0.2)

## 2023-04-25 LAB — BASIC METABOLIC PANEL WITH GFR
Anion gap: 10 (ref 5–15)
BUN: 10 mg/dL (ref 8–23)
CO2: 23 mmol/L (ref 22–32)
Calcium: 7.8 mg/dL — ABNORMAL LOW (ref 8.9–10.3)
Chloride: 105 mmol/L (ref 98–111)
Creatinine, Ser: 0.85 mg/dL (ref 0.44–1.00)
GFR, Estimated: >60 mL/min (ref >60)
Glucose, Bld: 76 mg/dL (ref 70–99)
Potassium: 3.5 mmol/L (ref 3.5–5.1)
Sodium: 138 mmol/L (ref 135–145)

## 2023-04-25 LAB — GLUCOSE, CAPILLARY
Glucose-Capillary: 67 mg/dL — ABNORMAL LOW (ref 70–99)
Glucose-Capillary: 77 mg/dL (ref 70–99)
Glucose-Capillary: 103 mg/dL — ABNORMAL HIGH (ref 70–99)
Glucose-Capillary: 95 mg/dL (ref 70–99)

## 2023-04-25 MED ORDER — POLYETHYLENE GLYCOL 3350 17 GM/SCOOP PO POWD
238.0000 g | Freq: Once | ORAL | Status: AC
  Administered 2023-04-25: 238 g via ORAL
  Filled 2023-04-25 (×2): qty 238

## 2023-04-25 NOTE — Progress Notes (Signed)
 PROGRESS NOTE        PATIENT DETAILS Name: Lindsey Huber Age: 78 y.o. Sex: female Date of Birth: Jun 08, 1945 Admit Date: 04/23/2023 Admitting Physician Synetta Fail, MD UJW:[id number], [place], [name]  Brief Summary: Patient is a 78 y.o.  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    Significant events: 3/29>> admit to [facility]  Significant studies: 3/29>> CTA GI bleed: Focal angiodysplasia in the junction of descending colon/proximal sigmoid colon-associated with active extravasation of contrast.  Significant microbiology data: None  Procedures: None  Consults: GI  Subjective: Had some bloody output with colonoscopy prep.  No major issues overnight.  Objective: Vitals: Blood pressure 111/63, pulse 69, temperature 97.9 F (36.6 C), temperature source Oral, resp. rate 20, height 5\' 3"  (1.6 m), weight 52.6 kg, SpO2 96%.   Exam: Gen Exam:Alert awake-not in any distress HEENT:atraumatic, normocephalic Chest: B/L clear to auscultation anteriorly CVS:S1S2 regular Abdomen:soft non tender, non distended Extremities:no edema Neurology: Non focal Skin: no rash  Pertinent Labs/Radiology:    Latest Ref Rng & Units 04/25/2023    4:27 AM 04/24/2023    4:38 PM 04/24/2023    4:09 AM  CBC  WBC 4.0 - 10.5 K/uL 4.7  6.9  6.1   Hemoglobin 12.0 - 15.0 g/dL 8.1  9.3  7.4   Hematocrit 36.0 - 46.0 % 24.5  28.6  22.6   Platelets 150 - 400 K/uL 158  154  152     Lab Results  Component Value Date   NA 138 04/25/2023   K 3.5 04/25/2023   CL 105 04/25/2023   CO2 23 04/25/2023      Assessment/Plan: Lower GI bleed-presumed diverticular etiology with  acute blood loss anemia in the setting of anticoagulation with Eliquis (Eliquis held on prior discharge but resumed on 3/29 per patient) Some minimal bloody discharge with colonoscopy prep  Hb stable but did require 1 unit of PRBC on 3/30. Colonoscopy today Eliquis remains on hold Follow CBC. Await further recommendations from GI.  Chronic atrial fibrillation Telemetry monitoring She did resume Eliquis on 3/29-currently on hold.  May need to hold Eliquis for a longer.  If time before resumption.  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 Supportive care  SLE Stable-continue prednisone/Plaquenil  ILD Continue antifibrotic's  Bronchial asthma Not in exacerbation Continue bronchodilators  Seizure disorder Continue Keppra  BMI: Estimated body mass index is 20.55 kg/m as calculated from the following:   Height as of this encounter: 5\' 3"  (1.6 m).   Weight as of this encounter: 52.6 kg.   Code status:   Code Status: Full Code   DVT Prophylaxis: SCDs Start: 04/23/23 1740   Family Communication: None at bedside   Disposition Plan: Status is: Observation The patient will require care spanning > 2 midnights and should be moved to inpatient because: Severity of illness   Planned Discharge Destination:Home   Diet: Diet Order  Diet clear liquid Room service appropriate? Yes; Fluid consistency: Thin  Diet effective now                     Antimicrobial agents: Anti-infectives (From admission, onward)    Start     Dose/Rate Route Frequency Ordered Stop   04/24/23 1000  hydroxychloroquine (PLAQUENIL) tablet 200 mg        200 mg Oral Daily 04/23/23 1745          MEDICATIONS: Scheduled Meds:  sodium chloride    Intravenous Once   allopurinol  100 mg Oral Daily   fluticasone furoate-vilanterol  1 puff Inhalation Daily   And   umeclidinium bromide  1 puff Inhalation Daily   hydroxychloroquine  200 mg Oral Daily   levETIRAcetam  500 mg Oral BID   Nintedanib  150 mg Oral QHS   OLANZapine  2.5 mg Oral QHS   pantoprazole  40 mg Oral Daily   predniSONE  5 mg Oral Q breakfast   sodium chloride flush  3 mL Intravenous Q12H   Continuous Infusions:  sodium chloride     PRN Meds:.acetaminophen **OR** acetaminophen, albuterol   I have personally reviewed following labs and imaging studies  LABORATORY DATA: CBC: Recent Labs  Lab 04/23/23 1448 04/23/23 2033 04/24/23 0409 04/24/23 1638 04/25/23 0427  WBC 6.2 6.7 6.1 6.9 4.7  NEUTROABS 4.1  --   --   --   --   HGB 7.7* 8.0* 7.4* 9.3* 8.1*  HCT 24.4* 24.9* 22.6* 28.6* 24.5*  MCV 102.5* 100.4* 98.3 97.9 96.1  PLT 149* 152 152 154 158    Basic Metabolic Panel: Recent Labs  Lab 04/21/23 0748 04/23/23 1448 04/24/23 0409 04/25/23 0427  NA 136 137 136 138  K 4.3 3.7 3.5 3.5  CL 101 103 105 105  CO2 25 24 25 23   GLUCOSE 83 85 72 76  BUN 19 20 15 10   CREATININE 1.33* 1.19* 0.99 0.85  CALCIUM 8.9 8.6* 8.2* 7.8*    GFR: Estimated Creatinine Clearance: 47.3 mL/min (by C-G formula based on SCr of 0.85 mg/dL).  Liver Function Tests: Recent Labs  Lab 04/23/23 1448 04/24/23 0409  AST 33 27  ALT 18 16  ALKPHOS 37* 31*  BILITOT 0.9 1.0  PROT 6.5 5.9*  ALBUMIN 3.0* 2.7*   No results for input(s): "LIPASE", "AMYLASE" in the last 168 hours. No results for input(s): "AMMONIA" in the last 168 hours.  Coagulation Profile: Recent Labs  Lab 04/23/23 1448  INR 1.3*    Cardiac Enzymes: No results for input(s): "CKTOTAL", "CKMB", "CKMBINDEX", "TROPONINI" in the last 168 hours.  BNP (last 3 results) No results for input(s): "PROBNP" in the last 8760 hours.  Lipid Profile: No results for input(s): "CHOL", "HDL", "LDLCALC", "TRIG",  "CHOLHDL", "LDLDIRECT" in the last 72 hours.  Thyroid Function Tests: No results for input(s): "TSH", "T4TOTAL", "FREET4", "T3FREE", "THYROIDAB" in the last 72 hours.  Anemia Panel: No results for input(s): "VITAMINB12", "FOLATE", "FERRITIN", "TIBC", "IRON", "RETICCTPCT" in the last 72 hours.  Urine analysis:    Component Value Date/Time   COLORURINE COLORLESS (A) 10/26/2022 1710   APPEARANCEUR CLEAR 10/26/2022 1710   LABSPEC 1.004 (L) 10/26/2022 1710   PHURINE 7.0 10/26/2022 1710   GLUCOSEU NEGATIVE 10/26/2022 1710   HGBUR NEGATIVE 10/26/2022 1710   BILIRUBINUR NEGATIVE 10/26/2022 1710   KETONESUR NEGATIVE 10/26/2022 1710   PROTEINUR NEGATIVE 10/26/2022 1710   NITRITE NEGATIVE 10/26/2022 1710   LEUKOCYTESUR NEGATIVE 10/26/2022 1710  Sepsis Labs: Lactic Acid, Venous    Component Value Date/Time   LATICACIDVEN 1.9 04/17/2023 2247    MICROBIOLOGY: No results found for this or any previous visit (from the past 240 hours).  RADIOLOGY STUDIES/RESULTS: CT Angio Abd/Pel W and/or Wo Contrast Result Date: 04/23/2023 CLINICAL DATA:  Lower GI bleed.  Rectal bleeding. EXAM: CTA ABDOMEN AND PELVIS WITHOUT AND WITH CONTRAST TECHNIQUE: Multidetector CT imaging of the abdomen and pelvis was performed using the standard protocol during bolus administration of intravenous contrast. Multiplanar reconstructed images and MIPs were obtained and reviewed to evaluate the vascular anatomy. RADIATION DOSE REDUCTION: This exam was performed according to the departmental dose-optimization program which includes automated exposure control, adjustment of the mA and/or kV according to patient size and/or use of iterative reconstruction technique. CONTRAST:  75mL OMNIPAQUE IOHEXOL 350 MG/ML SOLN COMPARISON:  CT Angiography abdomen and pelvis from 04/17/2023. FINDINGS: VASCULAR Aorta: Normal caliber aorta without aneurysm, dissection, vasculitis or significant stenosis. Celiac: Mild-to-moderate atherosclerotic  vascular calcifications noted at the origin. Otherwise patent without evidence of aneurysm, dissection, vasculitis or significant stenosis. SMA: Patent without evidence of aneurysm, dissection, vasculitis or significant stenosis. Renals: Both renal arteries are patent without evidence of aneurysm, dissection, vasculitis, fibromuscular dysplasia or significant stenosis. IMA: Patent without evidence of aneurysm, dissection, vasculitis or significant stenosis. Inflow: Patent without evidence of aneurysm, dissection, vasculitis or significant stenosis. Proximal Outflow: Bilateral common femoral and visualized portions of the superficial and profunda femoral arteries are patent without evidence of aneurysm, dissection, vasculitis or significant stenosis. Veins: No obvious venous abnormality within the limitations of this arterial phase study. Review of the MIP images confirms the above findings. NON-VASCULAR Lower chest: There are peripheral reticulations along with bronchiectasis and honeycombing in the visualized bilateral lung bases, favoring UIP pattern. Correlate clinically. No pleural effusion or lung consolidation. The heart is markedly enlarged in size. No pericardial effusion. Cardiac pacemaker leads partially seen. Hepatobiliary: The liver is normal in size. Non-cirrhotic configuration. No suspicious mass. No intrahepatic or extrahepatic bile duct dilation. Small volume sub 5 mm calcified gallstones noted without imaging signs of acute cholecystitis. Normal gallbladder wall thickness. No pericholecystic inflammatory changes. Pancreas: Unremarkable. No pancreatic ductal dilatation or surrounding inflammatory changes. Spleen: Normal size spleen. There are at least 2, subcentimeter sized hypoattenuating areas, which are incompletely characterized on the current exam but grossly similar to the prior study from 07/02/2022. Adrenals/Urinary Tract: Adrenal glands are unremarkable. Persistent fetal renal lobulations  noted. No suspicious renal mass. No hydronephrosis. No renal or ureteric calculi. Urinary bladder is under distended, precluding optimal assessment. However, no large mass or stones identified. No perivesical fat stranding. Stomach/Bowel: No disproportionate dilation of the small or large bowel loops. Unremarkable appendix. There are multiple diverticula throughout the colon without diverticulitis. There are several small tortuous vessels in the mucosa/submucosa of the colon at the junction of descending colon and sigmoid colon. The supplying artery arises from the inferior mesenteric artery. There is subtle contrast blush along the dependent portion in this region however, no significant interval increase in the amount of contrast when compared on the arterial and portal venous phase images. Findings are concerning for angiodysplasia with probable associated slow active extravasation of contrast/hemorrhage. Correlate clinically and with colonoscopy. Vascular/Lymphatic: No ascites or pneumoperitoneum. No abdominal or pelvic lymphadenopathy, by size criteria. No aneurysmal dilation of the major abdominal arteries. There are mild peripheral atherosclerotic vascular calcifications of the aorta and its major branches. Reproductive: Limited evaluation of reproductive organs on the CT scan exam. However,  note is made of retroverted uterus containing multiple calcified leiomyomas. No large adnexal mass seen. Other: The visualized soft tissues and abdominal wall are unremarkable. Musculoskeletal: No suspicious osseous lesions. There are mild - moderate multilevel degenerative changes in the visualized spine. There is grade 1/2 anterolisthesis of L4 over L5. IMPRESSION: 1. 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 There is probable associated slow active extravasation of contrast. Correlate clinically and with colonoscopy. 2. Multiple other  nonacute observations (such as usual interstitial pneumonia in the visualized lung bases, cholelithiasis without acute cholecystitis, indeterminate hypoattenuating splenic lesions, colonic diverticula without diverticulitis, multiple calcified uterine leiomyomas, etc.), as described above. Aortic Atherosclerosis (ICD10-I70.0). Electronically Signed   By: Jules Schick M.D.   On: 04/23/2023 16:59     LOS: 1 day   Jeoffrey Massed, MD  [facility]    To contact the attending provider between 7A-7P or the covering provider during after hours 7P-7A, please log into the web site [email or web address] and access using universal [place] password for that web site. If you do not have the password, please call the hospital operator.  04/25/2023, 9:35 AM

## 2023-04-25 NOTE — Plan of Care (Signed)

## 2023-04-25 NOTE — Progress Notes (Signed)
 Subjective: No further bleeding. Bowel movements no blood but not clear despite prep.  Objective: Vital signs in last 24 hours: Temp:  [97.3 F (36.3 C)-98.8 F (37.1 C)] 98.8 F (37.1 C) (03/31 1112) Pulse Rate:  [69-73] 69 (03/31 1112) Resp:  [14-26] 17 (03/31 1112) BP: (105-160)/(52-86) 131/60 (03/31 1112) SpO2:  [92 %-100 %] 99 % (03/31 1112) Weight change:  Last BM Date : 04/24/23  PE: GEN:  NAD ABD:  Soft, non-tender  Lab Results: CBC    Component Value Date/Time   WBC 4.7 04/25/2023 0427   RBC 2.55 (L) 04/25/2023 0427   HGB 8.1 (L) 04/25/2023 0427   HGB 10.4 (L) 12/07/2022 1432   HCT 24.5 (L) 04/25/2023 0427   HCT 33.3 (L) 12/07/2022 1432   PLT 158 04/25/2023 0427   PLT 167 12/07/2022 1432   MCV 96.1 04/25/2023 0427   MCV 100 (H) 12/07/2022 1432   MCH 31.8 04/25/2023 0427   MCHC 33.1 04/25/2023 0427   RDW 15.8 (H) 04/25/2023 0427   RDW 15.4 12/07/2022 1432   LYMPHSABS 1.5 04/23/2023 1448   MONOABS 0.6 04/23/2023 1448   EOSABS 0.1 04/23/2023 1448   BASOSABS 0.0 04/23/2023 1448  CMP     Component Value Date/Time   NA 138 04/25/2023 0427   NA 144 12/07/2022 1432   K 3.5 04/25/2023 0427   CL 105 04/25/2023 0427   CO2 23 04/25/2023 0427   GLUCOSE 76 04/25/2023 0427   BUN 10 04/25/2023 0427   BUN 25 12/07/2022 1432   CREATININE 0.85 04/25/2023 0427   CREATININE 1.11 (H) 05/03/2022 1519   CALCIUM 7.8 (L) 04/25/2023 0427   PROT 5.9 (L) 04/24/2023 0409   ALBUMIN 2.7 (L) 04/24/2023 0409   AST 27 04/24/2023 0409   ALT 16 04/24/2023 0409   ALKPHOS 31 (L) 04/24/2023 0409   BILITOT 1.0 04/24/2023 0409   GFR 57.36 (L) 08/20/2020 0919   EGFR 50 (L) 12/07/2022 1432   GFRNONAA >60 04/25/2023 0427   Assessment:   Hematochezia, resolved.  CTA with suspected AVM at descending/sigmoid junction.  Patient had colonoscopy November 2022 also for bleeding which showed diverticulosis otherwise unrevealing.  Plan:   Clear liquids, NPO after midnight. Colonoscopy  tomorrow after further bowel prep. Risks (bleeding, infection, bowel perforation that could require surgery, sedation-related changes in cardiopulmonary systems), benefits (identification and possible treatment of source of symptoms, exclusion of certain causes of symptoms), and alternatives (watchful waiting, radiographic imaging studies, empiric medical treatment) of colonoscopy were explained to patient/family in detail and patient wishes to proceed.  Anticoagulation on hold. [facility] will follow.   Lindsey Huber 04/25/2023, 1:17 PM   Cell [phone] If no answer or after 5 PM call [phone]

## 2023-04-26 ENCOUNTER — Encounter (HOSPITAL_COMMUNITY)
Admission: EM | Disposition: A | Discharge status: Home Health | Payer: Self-pay | Source: Home / Self Care | Attending: Internal Medicine

## 2023-04-26 ENCOUNTER — Inpatient Hospital Stay (HOSPITAL_COMMUNITY): Admitting: Anesthesiology

## 2023-04-26 ENCOUNTER — Encounter (HOSPITAL_COMMUNITY): Payer: Self-pay | Admitting: Gastroenterology

## 2023-04-26 DIAGNOSIS — K648 Other hemorrhoids: Secondary | ICD-10-CM

## 2023-04-26 DIAGNOSIS — K922 Gastrointestinal hemorrhage, unspecified: Secondary | ICD-10-CM | POA: Diagnosis not present

## 2023-04-26 DIAGNOSIS — Z95810 Presence of automatic (implantable) cardiac defibrillator: Secondary | ICD-10-CM | POA: Diagnosis not present

## 2023-04-26 DIAGNOSIS — K920 Hematemesis: Secondary | ICD-10-CM | POA: Diagnosis not present

## 2023-04-26 DIAGNOSIS — I110 Hypertensive heart disease with heart failure: Secondary | ICD-10-CM | POA: Diagnosis not present

## 2023-04-26 DIAGNOSIS — I5032 Chronic diastolic (congestive) heart failure: Secondary | ICD-10-CM

## 2023-04-26 DIAGNOSIS — E039 Hypothyroidism, unspecified: Secondary | ICD-10-CM | POA: Diagnosis not present

## 2023-04-26 DIAGNOSIS — J45909 Unspecified asthma, uncomplicated: Secondary | ICD-10-CM | POA: Diagnosis not present

## 2023-04-26 DIAGNOSIS — K5730 Diverticulosis of large intestine without perforation or abscess without bleeding: Secondary | ICD-10-CM

## 2023-04-26 HISTORY — PX: COLONOSCOPY: SHX5424

## 2023-04-26 LAB — GLUCOSE, CAPILLARY
Glucose-Capillary: 41 mg/dL — CL (ref 70–99)
Glucose-Capillary: 47 mg/dL — ABNORMAL LOW (ref 70–99)
Glucose-Capillary: 208 mg/dL — ABNORMAL HIGH (ref 70–99)
Glucose-Capillary: 144 mg/dL — ABNORMAL HIGH (ref 70–99)
Glucose-Capillary: 98 mg/dL (ref 70–99)

## 2023-04-26 SURGERY — COLONOSCOPY
Anesthesia: Monitor Anesthesia Care

## 2023-04-26 MED ORDER — DEXTROSE 50 % IV SOLN
25.0000 mL | Freq: Once | INTRAVENOUS | Status: AC
  Administered 2023-04-26: 25 mL via INTRAVENOUS
  Filled 2023-04-26: qty 50

## 2023-04-26 MED ORDER — PROPOFOL 500 MG/50ML IV EMUL
INTRAVENOUS | Status: DC | PRN
  Administered 2023-04-26: 100 ug/kg/min via INTRAVENOUS

## 2023-04-26 MED ORDER — SODIUM CHLORIDE 0.9 % IV SOLN
INTRAVENOUS | Status: AC | PRN
  Administered 2023-04-26: 500 mL via INTRAVENOUS

## 2023-04-26 MED ORDER — APIXABAN 2.5 MG PO TABS: 2.5000 mg | ORAL_TABLET | Freq: Two times a day (BID) | ORAL | Status: DC

## 2023-04-26 MED ORDER — SODIUM CHLORIDE 0.9 % IV SOLN: INTRAVENOUS | Status: DC | PRN

## 2023-04-26 NOTE — Plan of Care (Signed)

## 2023-04-26 NOTE — Progress Notes (Signed)
 Patient to be discharged home in care of grand daughter. Currently getting dressing for home.

## 2023-04-26 NOTE — Discharge Summary (Signed)
 PATIENT DETAILS Name: Lindsey Huber Age: 78 y.o. Sex: female Date of Birth: 04-08-1945 MRN: [id number]. Admitting Physician: Synetta Fail, MD [id number], [place], [name]  Admit Date: 04/23/2023 Discharge date: 04/26/2023  Recommendations for Outpatient Follow-up:  Follow up with PCP in 1-2 weeks Please obtain CMP/CBC in one week Resume Eliquis in 1 week Ensure follow-up with gastroenterology  Admitted From:  Home  Disposition: Home   Discharge Condition: good  CODE STATUS:   Code Status: Full Code   Diet recommendation:  Diet Order             DIET DYS 3 Room service appropriate? Yes; Fluid consistency: Thin  Diet effective now           Diet - low sodium heart healthy                    Brief Summary: Patient is a 78 y.o.  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     Significant events: 3/29>> admit to [facility]   Significant studies: 3/29>> CTA GI bleed: Focal angiodysplasia in the junction of descending colon/proximal sigmoid colon-associated with active extravasation of contrast.   Significant microbiology data: None   Procedures: 4/1>> colonoscopy: Hemorrhoids/diverticulosis-no active bleeding.   Consults: GI  Brief Hospital Course: Lower GI bleed-presumed diverticular etiology with acute blood loss anemia in the setting of anticoagulation with Eliquis (Eliquis held on prior discharge but resumed on 3/29 per patient) Managed with supportive care-did require 1 unit of PRBC on 3/30-since then Hb stable. Hematochezia has resolved-clear output with colonoscopy prep Colonoscopy did not show any active bleeding Per discussion with GI-hold  Eliquis x 1 week-and then resume-as this is second episode of recurrent GI bleed/diverticular with occurred after reinitiation of Eliquis. If has recurrent bleeding in the near future-May need to consider stopping Eliquis permanently. Please ensure follow-up with GI-repeat CBC in 1 week.  Chronic atrial fibrillation Patient resumed her Eliquis on 3/29-subsequently had recurrent hematochezia.  Eliquis was held-recommendations from GI to resume in 1 week.    Sick sinus syndrome CHB/ischemic cardiomyopathy-s/p ICD implantation Stable-monitored on telemetry while inpatient.   Chronic HFpEF Euvolemic   S/p bioprosthetic AVR/mitral valve annuloplasty/tricuspid valve repair followed by replacement Most recent echo on October 2024 which stable valvular parities. Supportive care   SLE Stable-continue prednisone/Plaquenil   ILD Continue antifibrotic's   Bronchial asthma Not in exacerbation Continue bronchodilators   Seizure disorder Continue Keppra   BMI: Estimated body mass index is 20.55 kg/m as calculated from the following:   Height as of this encounter: 5\' 3"  (1.6 m).   Weight as of this encounter: 52.6 kg.   Discharge Diagnoses:  Principal Problem:   Acute GI bleeding Active Problems:   Essential (primary) hypertension   Gastroesophageal reflux disease   Idiopathic pulmonary fibrosis   Systemic lupus erythematosus   Chronic heart failure with preserved ejection fraction   Chronic kidney disease (CKD), active medical management without dialysis, stage 3 (moderate)   Diverticulosis of colon   Acquired hypothyroidism   AICD (automatic cardioverter/defibrillator) present   CHB (complete heart block)   Chronic gouty arthritis   Iron deficiency anemia   Hyperlipidemia   Raynaud's disease   Rheumatoid arthritis   History of TIA (transient ischemic  attack)   Mild dementia   Seizure (HCC)   Chronic a-fib (HCC)   S/P AVR   S/P TVR (tricuspid valve repair)   S/P mitral  valve repair   Asthma, chronic   Gastrointestinal hemorrhage with melena   Discharge Instructions:  Activity:  As tolerated   Discharge Instructions     Call MD for:   Complete by: As directed    Recurrent bright red blood per rectum   Diet - low sodium heart healthy   Complete by: As directed    Discharge instructions   Complete by: As directed    Follow with Primary MD  Hyacinth Meeker, [place], [place] in 1-2 weeks  Follow-up with [facility] gastroenterology in the next 2 weeks  Hold Eliquis for 1 more week and resume accordingly.  Please get a complete blood count and chemistry panel checked by your Primary MD at your next visit, and again as instructed by your Primary MD.  Get Medicines reviewed and adjusted: Please take all your medications with you for your next visit with your Primary MD  Laboratory/radiological data: Please request your Primary MD to go over all hospital tests and procedure/radiological results at the follow up, please ask your Primary MD to get all Hospital records sent to his/her office.  In some cases, they will be blood work, cultures and biopsy results pending at the time of your discharge. Please request that your primary care M.D. follows up on these results.  Also Note the following: If you experience worsening of your admission symptoms, develop shortness of breath, life threatening emergency, suicidal or homicidal thoughts you must seek medical attention immediately by calling 911 or calling your MD immediately  if symptoms less severe.  You must read complete instructions/literature along with all the possible adverse reactions/side effects for all the Medicines you take and that have been prescribed to you. Take any new Medicines after you have completely understood and accpet all the possible adverse reactions/side effects.   Do not drive when taking Pain medications or sleeping medications (Benzodaizepines)  Do not take more than prescribed Pain,  Sleep and Anxiety Medications. It is not advisable to combine anxiety,sleep and pain medications without talking with your primary care practitioner  Special Instructions: If you have smoked or chewed Tobacco  in the last 2 yrs please stop smoking, stop any regular Alcohol  and or any Recreational drug use.  Wear Seat belts while driving.  Please note: You were cared for by a hospitalist during your hospital stay. Once you are discharged, your primary care physician will handle any further medical issues. Please note that NO REFILLS for any discharge medications will be authorized once you are discharged, as it is imperative that you return to your primary care physician (or establish a relationship with a primary care physician if you do not have one) for your post hospital discharge needs so that they can reassess your need for medications and monitor your lab values.   Increase activity slowly   Complete by: As directed       Allergies as of 04/26/2023       Reactions   Diprivan [propofol] Shortness Of Breath, Other (See Comments)   "Caused asthma"   Other Other (See Comments)   Patient is to NOT EAT any foods with husks or tree nuts, popcorn   Tambocor [flecainide] Other (See Comments)   Unknown reaction   Norvasc [amlodipine] Other (See Comments)   Fatigue Syncope   Pacerone [amiodarone] Other (See  Comments)   Delirium Confusion Psychosis        Medication List     TAKE these medications    acetaminophen 500 MG tablet Commonly known as: TYLENOL Take 1,000 mg by mouth every 6 (six) hours as needed (pain).   albuterol 108 (90 Base) MCG/ACT inhaler Commonly known as: VENTOLIN HFA Inhale 2 puffs into the lungs every 6 (six) hours as needed for wheezing or shortness of breath.   allopurinol 100 MG tablet Commonly known as: ZYLOPRIM Take 1 tablet (100 mg total) by mouth daily.   amoxicillin 500 MG capsule Commonly known as: AMOXIL 4 capsules by mouth 1 hour before  dental procedure.   apixaban 2.5 MG Tabs tablet Commonly known as: ELIQUIS Take 1 tablet (2.5 mg total) by mouth 2 (two) times daily. Start taking on: May 01, 2023 What changed: These instructions start on May 01, 2023. If you are unsure what to do until then, ask your doctor or other care provider.   Breztri Aerosphere 160-9-4.8 MCG/ACT Aero Generic drug: budeson-glycopyrrolate-formoterol Inhale 2 puffs into the lungs 2 (two) times daily as needed ("for flares"). What changed: when to take this   calcium-vitamin D 500-5 MG-MCG tablet Commonly known as: OSCAL WITH D Take 2 tablets by mouth 2 (two) times daily. What changed:  how much to take when to take this   cetirizine HCl 5 MG/5ML Soln Commonly known as: Zyrtec Take 1 mL (1 mg total) by mouth 2 (two) times daily as needed for rhinitis or allergies.   fluticasone 50 MCG/ACT nasal spray Commonly known as: FLONASE PLACE 1 SPRAY INTO BOTH NOSTRILS 2 (TWO) TIMES DAILY   furosemide 40 MG tablet Commonly known as: LASIX Take 1 tablet (40 mg total) by mouth daily.   hydroxychloroquine 200 MG tablet Commonly known as: PLAQUENIL Take 1 tablet (200 mg total) by mouth daily.   levETIRAcetam 500 MG tablet Commonly known as: KEPPRA Take 1 tablet (500 mg total) by mouth 2 (two) times daily.   magnesium oxide 400 (240 Mg) MG tablet Commonly known as: MAG-OX TAKE 1 TABLET BY MOUTH EVERY DAY   MiraLax 17 GM/SCOOP powder Generic drug: polyethylene glycol powder Take 17 g by mouth daily as needed for mild constipation.   Ofev 150 MG Caps Generic drug: Nintedanib Take 1 capsule (150 mg total) by mouth at bedtime.   OLANZapine 2.5 MG tablet Commonly known as: ZYPREXA TAKE 1 TABLET BY MOUTH EVERYDAY AT BEDTIME   pantoprazole 40 MG tablet Commonly known as: Protonix Take 1 tablet (40 mg total) by mouth 2 (two) times daily. What changed: when to take this   potassium chloride 10 MEQ tablet Commonly known as: Klor-Con  M10 Take 2 tablets (20 mEq total) by mouth daily.   predniSONE 5 MG tablet Commonly known as: DELTASONE Take 1 tablet (5 mg total) by mouth daily with breakfast. Patient to double up dose during sick day rule   Vitamin D3 25 MCG (1000 UT) Caps Take 1,000 Units by mouth daily with lunch.        Allergies  Allergen Reactions   Diprivan [Propofol] Shortness Of Breath and Other (See Comments)    "Caused asthma"   Other Other (See Comments)    Patient is to NOT EAT any foods with husks or tree nuts, popcorn   Tambocor [Flecainide] Other (See Comments)    Unknown reaction   Norvasc [Amlodipine] Other (See Comments)    Fatigue Syncope   Pacerone [Amiodarone] Other (See Comments)  Delirium Confusion Psychosis     Other Procedures/Studies: CT Angio Abd/Pel W and/or Wo Contrast Result Date: 04/23/2023 CLINICAL DATA:  Lower GI bleed.  Rectal bleeding. EXAM: CTA ABDOMEN AND PELVIS WITHOUT AND WITH CONTRAST TECHNIQUE: Multidetector CT imaging of the abdomen and pelvis was performed using the standard protocol during bolus administration of intravenous contrast. Multiplanar reconstructed images and MIPs were obtained and reviewed to evaluate the vascular anatomy. RADIATION DOSE REDUCTION: This exam was performed according to the departmental dose-optimization program which includes automated exposure control, adjustment of the mA and/or kV according to patient size and/or use of iterative reconstruction technique. CONTRAST:  75mL OMNIPAQUE IOHEXOL 350 MG/ML SOLN COMPARISON:  CT Angiography abdomen and pelvis from 04/17/2023. FINDINGS: VASCULAR Aorta: Normal caliber aorta without aneurysm, dissection, vasculitis or significant stenosis. Celiac: Mild-to-moderate atherosclerotic vascular calcifications noted at the origin. Otherwise patent without evidence of aneurysm, dissection, vasculitis or significant stenosis. SMA: Patent without evidence of aneurysm, dissection, vasculitis or significant  stenosis. Renals: Both renal arteries are patent without evidence of aneurysm, dissection, vasculitis, fibromuscular dysplasia or significant stenosis. IMA: Patent without evidence of aneurysm, dissection, vasculitis or significant stenosis. Inflow: Patent without evidence of aneurysm, dissection, vasculitis or significant stenosis. Proximal Outflow: Bilateral common femoral and visualized portions of the superficial and profunda femoral arteries are patent without evidence of aneurysm, dissection, vasculitis or significant stenosis. Veins: No obvious venous abnormality within the limitations of this arterial phase study. Review of the MIP images confirms the above findings. NON-VASCULAR Lower chest: There are peripheral reticulations along with bronchiectasis and honeycombing in the visualized bilateral lung bases, favoring UIP pattern. Correlate clinically. No pleural effusion or lung consolidation. The heart is markedly enlarged in size. No pericardial effusion. Cardiac pacemaker leads partially seen. Hepatobiliary: The liver is normal in size. Non-cirrhotic configuration. No suspicious mass. No intrahepatic or extrahepatic bile duct dilation. Small volume sub 5 mm calcified gallstones noted without imaging signs of acute cholecystitis. Normal gallbladder wall thickness. No pericholecystic inflammatory changes. Pancreas: Unremarkable. No pancreatic ductal dilatation or surrounding inflammatory changes. Spleen: Normal size spleen. There are at least 2, subcentimeter sized hypoattenuating areas, which are incompletely characterized on the current exam but grossly similar to the prior study from 07/02/2022. Adrenals/Urinary Tract: Adrenal glands are unremarkable. Persistent fetal renal lobulations noted. No suspicious renal mass. No hydronephrosis. No renal or ureteric calculi. Urinary bladder is under distended, precluding optimal assessment. However, no large mass or stones identified. No perivesical fat  stranding. Stomach/Bowel: No disproportionate dilation of the small or large bowel loops. Unremarkable appendix. There are multiple diverticula throughout the colon without diverticulitis. There are several small tortuous vessels in the mucosa/submucosa of the colon at the junction of descending colon and sigmoid colon. The supplying artery arises from the inferior mesenteric artery. There is subtle contrast blush along the dependent portion in this region however, no significant interval increase in the amount of contrast when compared on the arterial and portal venous phase images. Findings are concerning for angiodysplasia with probable associated slow active extravasation of contrast/hemorrhage. Correlate clinically and with colonoscopy. Vascular/Lymphatic: No ascites or pneumoperitoneum. No abdominal or pelvic lymphadenopathy, by size criteria. No aneurysmal dilation of the major abdominal arteries. There are mild peripheral atherosclerotic vascular calcifications of the aorta and its major branches. Reproductive: Limited evaluation of reproductive organs on the CT scan exam. However, note is made of retroverted uterus containing multiple calcified leiomyomas. No large adnexal mass seen. Other: The visualized soft tissues and abdominal wall are unremarkable. Musculoskeletal: No suspicious osseous  lesions. There are mild - moderate multilevel degenerative changes in the visualized spine. There is grade 1/2 anterolisthesis of L4 over L5. IMPRESSION: 1. 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 There is probable associated slow active extravasation of contrast. Correlate clinically and with colonoscopy. 2. 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 Aortic Atherosclerosis (ICD10-I70.0). Electronically Signed   By: Jules Schick M.D.   On: 04/23/2023 16:59   CT Angio Abd/Pel W and/or Wo Contrast Result Date: 04/17/2023 CLINICAL DATA:  Rectal bleeding.  Possible acute lower GI bleed. EXAM: CTA ABDOMEN AND PELVIS WITHOUT AND WITH CONTRAST TECHNIQUE: Multidetector CT imaging of the abdomen and pelvis was performed using the standard protocol during bolus administration of intravenous contrast. Multiplanar reconstructed images and MIPs were obtained and reviewed to evaluate the vascular anatomy. RADIATION DOSE REDUCTION: This exam was performed according to the departmental dose-optimization program which includes automated exposure control, adjustment of the mA and/or kV according to patient size and/or use of iterative reconstruction technique. CONTRAST:  75mL OMNIPAQUE IOHEXOL 350 MG/ML SOLN COMPARISON:  None Available. FINDINGS: VASCULAR Aorta: Normal caliber aorta without aneurysm, dissection, vasculitis or significant stenosis. Scattered atherosclerotic vascular calcifications. Celiac: Patent without evidence of aneurysm, dissection, vasculitis or significant stenosis. SMA: Patent without evidence of aneurysm, dissection, vasculitis or significant stenosis. Renals: Renal arteries are patent without evidence of aneurysm, dissection, vasculitis, fibromuscular dysplasia or significant stenosis. Small accessory left renal artery. IMA: Patent without evidence of aneurysm, dissection, vasculitis or significant stenosis. Inflow: Patent without evidence of aneurysm, dissection, vasculitis or significant stenosis. Proximal Outflow: Bilateral common femoral and visualized portions of the superficial and profunda femoral arteries are patent without evidence of aneurysm, dissection, vasculitis or significant stenosis. Veins: No focal venous abnormality. Review of the MIP images confirms  the above findings. NON-VASCULAR Lower chest: Marked cardiomegaly. Partially imaged cardiac rhythm maintenance device with leads in the right atrium and right ventricle. Severe biatrial enlargement. Evidence of prior aortic, mitral and tricuspid valve replacement. No pericardial effusion. Subpleural reticulation, architectural distortion and honeycombing in the lung bases consistent with usual interstitial pneumonitis. Hepatobiliary: Normal hepatic contour and morphology. No discrete hepatic lesion. High attenuation stones layer in the gallbladder lumen. No biliary ductal dilatation. Pancreas: Unremarkable. No pancreatic ductal dilatation or surrounding inflammatory changes. Spleen: No splenic injury or perisplenic hematoma. Adrenals/Urinary Tract: Adrenal glands are unremarkable. Kidneys are normal, without renal calculi, focal lesion, or hydronephrosis. Bladder is unremarkable. Stomach/Bowel: Stomach is within normal limits. Appendix appears normal. No evidence of bowel wall thickening, distention, or inflammatory changes. No evidence of arterial extravasation contrast to suggest acute lower GI bleeding. There are a few scattered colonic diverticula. Lymphatic: No significant vascular findings are present. No enlarged abdominal or pelvic lymph nodes. Reproductive: Degenerated calcified uterine fibroids. No adnexal mass. Other: No abdominal wall hernia or abnormality. No abdominopelvic ascites. Musculoskeletal: Severe grade 2 anterolisthesis of L4 on L5 with advanced degenerative disc disease. Chronic compression fracture of the superior endplate of L3 with approximately 45% height loss. No acute fracture or malalignment. IMPRESSION: 1. No evidence of active bleeding at the time of imaging. 2. Scattered atherosclerotic vascular calcifications without aneurysm, dissection or other acute vascular abnormality. 3. Marked cardiomegaly with surgical changes of prior aortic, mitral and tricuspid valve replacement. 4.  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 5. Cholelithiasis without evidence of acute cholecystitis. 6. Scattered colonic diverticula without evidence of active bleeding or diverticulitis. 7. Degenerated calcified uterine fibroids. 8. Severe grade 2 anterolisthesis of L4 on L5 with advanced associated degenerative disc disease. 9. Chronic appearing compression fracture of the superior endplate of L3 with 45% height loss. Electronically Signed   By: Malachy Moan M.D.   On: 04/17/2023 15:41     TODAY-DAY OF DISCHARGE:  Subjective:   [name] today has no headache,no chest abdominal pain,no new weakness tingling or numbness, feels much better wants to go home today.   Objective:   Blood pressure (!) 117/58, pulse 69, temperature 97.9 F (36.6 C), temperature source Temporal, resp. rate (!) 24, height 5\' 3"  (1.6 m), weight 52.6 kg, SpO2 100%.  Intake/Output Summary (Last 24 hours) at 04/26/2023 0929 Last data filed at 04/26/2023 0859 Gross per 24 hour  Intake 161.05 ml  Output --  Net 161.05 ml   Filed Weights   04/23/23 1417  Weight: 52.6 kg    Exam: Awake Alert, Oriented *3, No new F.N deficits, Normal affect [place].AT,PERRAL Supple Neck,No JVD, No cervical lymphadenopathy appriciated.  Symmetrical Chest wall movement, Good air movement bilaterally, CTAB RRR,No Gallops,Rubs or new Murmurs, No Parasternal Heave +ve B.Sounds, Abd Soft, Non tender, No organomegaly appriciated, No rebound -guarding or rigidity. No Cyanosis, Clubbing or edema, No new Rash or bruise   PERTINENT RADIOLOGIC STUDIES: No results found.   PERTINENT LAB RESULTS: CBC: Recent Labs    04/25/23 0427 04/25/23 1719  WBC 4.7 5.7  HGB 8.1* 8.5*  HCT 24.5* 26.2*  PLT 158 174   CMET CMP     Component Value Date/Time   NA 138 04/25/2023 0427   NA 144 12/07/2022 1432   K 3.5 04/25/2023 0427   CL 105 04/25/2023 0427   CO2 23 04/25/2023 0427   GLUCOSE 76 04/25/2023 0427   BUN  10 04/25/2023 0427   BUN 25 12/07/2022 1432   CREATININE 0.85 04/25/2023 0427   CREATININE 1.11 (H) 05/03/2022 1519   CALCIUM 7.8 (L) 04/25/2023 0427   PROT 5.9 (L) 04/24/2023 0409   ALBUMIN 2.7 (L) 04/24/2023 0409   AST 27 04/24/2023 0409   ALT 16 04/24/2023 0409   ALKPHOS 31 (L) 04/24/2023 0409   BILITOT 1.0 04/24/2023 0409   GFR 57.36 (L) 08/20/2020 0919   EGFR 50 (L) 12/07/2022 1432   GFRNONAA >60 04/25/2023 0427    GFR Estimated Creatinine Clearance: 47.3 mL/min (by C-G formula based on SCr of 0.85 mg/dL). No results for input(s): "LIPASE", "AMYLASE" in the last 72 hours. No results for input(s): "CKTOTAL", "CKMB", "CKMBINDEX", "TROPONINI" in the last 72 hours. Invalid input(s): "POCBNP" No results for input(s): "DDIMER" in the last 72 hours. No results for input(s): "HGBA1C" in the last 72 hours. No results for input(s): "CHOL", "HDL", "LDLCALC", "TRIG", "CHOLHDL", "LDLDIRECT" in the last 72 hours. No results for input(s): "TSH", "T4TOTAL", "T3FREE", "THYROIDAB" in the last 72 hours.  Invalid input(s): "FREET3" No results for input(s): "VITAMINB12", "FOLATE", "FERRITIN", "TIBC", "IRON", "RETICCTPCT" in the last 72 hours. Coags: Recent Labs    04/23/23 1448  INR 1.3*   Microbiology: No results found for this or any previous visit (from the past 240 hours).  FURTHER DISCHARGE INSTRUCTIONS:  Get Medicines reviewed and adjusted: Please take all your medications with you for your next visit with your Primary MD  Laboratory/radiological data: Please request your Primary MD to go over  all hospital tests and procedure/radiological results at the follow up, please ask your Primary MD to get all Hospital records sent to his/her office.  In some cases, they will be blood work, cultures and biopsy results pending at the time of your discharge. Please request that your primary care M.D. goes through all the records of your hospital data and follows up on these results.  Also  Note the following: If you experience worsening of your admission symptoms, develop shortness of breath, life threatening emergency, suicidal or homicidal thoughts you must seek medical attention immediately by calling 911 or calling your MD immediately  if symptoms less severe.  You must read complete instructions/literature along with all the possible adverse reactions/side effects for all the Medicines you take and that have been prescribed to you. Take any new Medicines after you have completely understood and accpet all the possible adverse reactions/side effects.   Do not drive when taking Pain medications or sleeping medications (Benzodaizepines)  Do not take more than prescribed Pain, Sleep and Anxiety Medications. It is not advisable to combine anxiety,sleep and pain medications without talking with your primary care practitioner  Special Instructions: If you have smoked or chewed Tobacco  in the last 2 yrs please stop smoking, stop any regular Alcohol  and or any Recreational drug use.  Wear Seat belts while driving.  Please note: You were cared for by a hospitalist during your hospital stay. Once you are discharged, your primary care physician will handle any further medical issues. Please note that NO REFILLS for any discharge medications will be authorized once you are discharged, as it is imperative that you return to your primary care physician (or establish a relationship with a primary care physician if you do not have one) for your post hospital discharge needs so that they can reassess your need for medications and monitor your lab values.  Total Time spent coordinating discharge including counseling, education and face to face time equals greater than 30 minutes.  SignedJeoffrey Massed 04/26/2023 9:29 AM

## 2023-04-26 NOTE — Transfer of Care (Signed)
 Immediate Anesthesia Transfer of Care Note  Patient: Lindsey Huber  Procedure(s) Performed: COLONOSCOPY  Patient Location: Endoscopy Unit  Anesthesia Type:MAC  Level of Consciousness: awake, alert , and oriented  Airway & Oxygen Therapy: Patient Spontanous Breathing and Patient connected to nasal cannula oxygen  Post-op Assessment: Report given to RN and Post -op Vital signs reviewed and stable  Post vital signs: Reviewed and stable  Last Vitals:  Vitals Value Taken Time  BP 124/70 04/26/23 0856  Temp 36.6 C 04/26/23 0856  Pulse 70 04/26/23 0856  Resp 25 04/26/23 0856  SpO2 97     Last Pain:  Vitals:   04/26/23 0856  TempSrc: Temporal  PainSc:          Complications: There were no known notable events for this encounter.

## 2023-04-26 NOTE — Progress Notes (Signed)
 BS recheck after D50 treatment and was 208.

## 2023-04-26 NOTE — Anesthesia Postprocedure Evaluation (Signed)
 Anesthesia Post Note  Patient: Lindsey Huber  Procedure(s) Performed: COLONOSCOPY     Patient location during evaluation: PACU Anesthesia Type: MAC Level of consciousness: awake and alert Pain management: pain level controlled Vital Signs Assessment: post-procedure vital signs reviewed and stable Respiratory status: spontaneous breathing, nonlabored ventilation and respiratory function stable Cardiovascular status: blood pressure returned to baseline and stable Postop Assessment: no apparent nausea or vomiting Anesthetic complications: no   There were no known notable events for this encounter.  Last Vitals:  Vitals:   04/26/23 1000 04/26/23 1025  BP: 115/89 125/68  Pulse: 71 70  Resp: (!) 21 13  Temp:    SpO2: 100% 100%    Last Pain:  Vitals:   04/26/23 0949  TempSrc:   PainSc: 0-No pain                 Lowella Curb

## 2023-04-26 NOTE — Interval H&P Note (Signed)
 History and Physical Interval Note:  04/26/2023 8:27 AM  Lindsey Huber  has presented today for surgery, with the diagnosis of Gastrointestinal bleed.  The various methods of treatment have been discussed with the patient and family. After consideration of risks, benefits and other options for treatment, the patient has consented to  Procedure(s): COLONOSCOPY (N/A) as a surgical intervention.  The patient's history has been reviewed, patient examined, no change in status, stable for surgery.  I have reviewed the patient's chart and labs.  Questions were answered to the patient's satisfaction.     Freddy Jaksch

## 2023-04-26 NOTE — Op Note (Signed)
 [facility] Patient Name: Lindsey Huber Procedure Date : 04/26/2023 MRN: [id number] Attending MD: Willis Modena , MD, [id number] Date of Birth: 1945-04-15 CSN: [id number] Age: 78 Admit Type: Inpatient Procedure:                Colonoscopy Indications:              Last colonoscopy: November 2022, Hematochezia,                            Abnormal CT of the GI tract (bleeding AVM                            descending/sigmoid junction?) Providers:                Willis Modena, MD, Lorenza Evangelist, RN, Sunday Corn                            Mbumina, Technician Referring MD:             [facility] Medicines:                Monitored Anesthesia Care Complications:            No immediate complications. Estimated Blood Loss:     Estimated blood loss: none. Procedure:                Pre-Anesthesia Assessment:                           - Prior to the procedure, a History and Physical                            was performed, and patient medications and                            allergies were reviewed. The patient's tolerance of                            previous anesthesia was also reviewed. The risks                            and benefits of the procedure and the sedation                            options and risks were discussed with the patient.                            All questions were answered, and informed consent                            was obtained. Prior Anticoagulants: The patient has                            taken Eliquis (apixaban), last dose was 3 days                            prior  to procedure. ASA Grade Assessment: III - A                            patient with severe systemic disease. After                            reviewing the risks and benefits, the patient was                            deemed in satisfactory condition to undergo the                            procedure.                           After obtaining informed consent,  the colonoscope                            was passed under direct vision. Throughout the                            procedure, the patient's blood pressure, pulse, and                            oxygen saturations were monitored continuously. The                            PCF-190TL ([id number]) Olympus colonoscope was                            introduced through the anus and advanced to the the                            cecum, identified by appendiceal orifice and                            ileocecal valve. The ileocecal valve, appendiceal                            orifice, and rectum were photographed. The entire                            colon was examined. The colonoscopy was performed                            without difficulty. The patient tolerated the                            procedure well. The quality of the bowel                            preparation was good. Scope In: 8:35:46 AM Scope Out: 8:51:01 AM Scope Withdrawal Time: 0 hours 10 minutes 11 seconds  Total Procedure Duration: 0 hours 15 minutes 15 seconds  Findings:  Hemorrhoids were found on perianal exam.      Internal hemorrhoids were found during endoscopy. The hemorrhoids were       mild.      No old or fresh blood was seen to the extent of our examination.      Scattered medium-mouthed diverticula were found in the sigmoid colon and       descending colon.      The exam was otherwise without abnormality. No old/fresh blood seen. No       AVMs, mass, polyps or inflammatory changes were seen. As during last       colonoscopy, diminutive rectal vault, unable to retroflex; saw       hemorrhoids but no other pathology in prograde views upon slow       withdrawal of the colonoscope. Impression:               - Hemorrhoids found on perianal exam.                           - Internal hemorrhoids.                           - Diverticulosis in the sigmoid colon and in the                            descending  colon.                           - The examination was otherwise normal.                           - No specimens collected.                           - The examination was otherwise normal. I suspect                            bleeding from colonic diverticula. Recommendation:           - Return patient to hospital ward for ongoing care.                           - Soft diet today.                           - Continue present medications.                           - I would hold Eliquis and anticoagulation for one                            week, if clinically feasible.                           - If patient has ongoing bleeding on                            anticoagulation, would need to carefully weigh  pros/cons of anticoagulation and assess whether                            benefits of anticoagulation are so high as to merit                            potential for ongoing recurrent GI bleeding.                           - [facility] will sign-off; we can arrange outpatient                            follow-up with [place]; please call with any questions;                            thank you for the consultation. Procedure Code(s):        --- Professional ---                           [id number], Colonoscopy, flexible; diagnostic, including                            collection of specimen(s) by brushing or washing,                            when performed (separate procedure) Diagnosis Code(s):        --- Professional ---                           K64.8, Other hemorrhoids                           K92.1, Melena (includes Hematochezia)                           K57.30, Diverticulosis of large intestine without                            perforation or abscess without bleeding                           R93.3, Abnormal findings on diagnostic imaging of                            other parts of digestive tract CPT copyright 2022 [employer] Association. All rights  reserved. The codes documented in this report are preliminary and upon coder review may  be revised to meet current compliance requirements. Willis Modena, MD 04/26/2023 9:07:58 AM This report has been signed electronically. Number of Addenda: 0

## 2023-04-26 NOTE — Progress Notes (Addendum)
 Patient discharge for home. Discharge education done with emphasis on medication, eliquis to be stopped for one week and follow up appointments. Patient to be transported to discharge lounge to be picked up by daughter later.  BS checked and was 41. Patient was given apple juice and currently eating. Dr. Reece Agar is aware.

## 2023-04-26 NOTE — Progress Notes (Signed)
 Patient left unit in wheelchair to have colonoscopy done.

## 2023-04-26 NOTE — Progress Notes (Signed)
 Call to room for elevated heart rate. Patient was checked and noted to be breathing comfortable. SPO2 probe changed and HR noted to be 70 and regular.

## 2023-04-26 NOTE — TOC Transition Note (Signed)
 Transition of Care [facility]) - Discharge Note   Patient Details  Name: Antrice Pal MRN: [id number] Date of Birth: 28-Oct-1945  [facility] CM/SW Contact:  Gordy Clement, RN Phone Number: 04/26/2023, 9:50 AM   Clinical Narrative:     Patient will DC to home today. Iantha Fallen will resume home health services. Family will transport           Patient Goals and CMS Choice            Discharge Placement                       Discharge Plan and Services Additional resources added to the After Visit Summary for                                       Social Drivers of Health (SDOH) Interventions SDOH Screenings   Food Insecurity: No Food Insecurity (04/23/2023)  Housing: Low Risk  (04/23/2023)  Transportation Needs: No Transportation Needs (04/23/2023)  Utilities: Not At Risk (04/23/2023)  Social Connections: Moderately Isolated (04/23/2023)  Tobacco Use: Low Risk  (04/23/2023)     Readmission Risk Interventions    11/05/2022   10:54 AM 07/05/2022   12:36 PM 12/27/2021    3:07 PM  Readmission Risk Prevention Plan  Transportation Screening Complete Complete Complete  PCP or Specialist Appt within 3-5 Days  Complete Complete  HRI or Home Care Consult  Complete Complete  Social Work Consult for Recovery Care Planning/Counseling  Complete Complete  Palliative Care Screening  Not Applicable Complete  Medication Review [facility]) Referral to Pharmacy Referral to Pharmacy Complete  PCP or Specialist appointment within 3-5 days of discharge Complete    HRI or Home Care Consult Complete    SW Recovery Care/Counseling Consult Complete    Palliative Care Screening Not Applicable    Skilled Nursing Facility Not Applicable

## 2023-04-26 NOTE — Anesthesia Preprocedure Evaluation (Addendum)
 Anesthesia Evaluation  Patient identified by MRN, date of birth, ID band Patient awake    Reviewed: Allergy & Precautions, H&P , NPO status , Patient's Chart, lab work & pertinent test results  Airway Mallampati: III  TM Distance: >3 FB Neck ROM: Full    Dental  (+) Teeth Intact, Dental Advisory Given, Missing   Pulmonary asthma    Pulmonary exam normal breath sounds clear to auscultation       Cardiovascular hypertension, Pt. on medications and Pt. on home beta blockers +CHF  + pacemaker  Rhythm:Regular Rate:Normal     Neuro/Psych Seizures -,       Dementia  negative psych ROS   GI/Hepatic negative GI ROS, Neg liver ROS,GERD  ,,  Endo/Other  Hypothyroidism    Renal/GU Renal InsufficiencyRenal disease  negative genitourinary   Musculoskeletal  (+) Arthritis , Osteoarthritis,    Abdominal   Peds  Hematology  (+) Blood dyscrasia, anemia   Anesthesia Other Findings   Reproductive/Obstetrics negative OB ROS                             Anesthesia Physical Anesthesia Plan  ASA: 3  Anesthesia Plan: MAC   Post-op Pain Management: Minimal or no pain anticipated   Induction: Intravenous  PONV Risk Score and Plan: 2 and Propofol infusion and Treatment may vary due to age or medical condition  Airway Management Planned: Natural Airway and Simple Face Mask  Additional Equipment:   Intra-op Plan:   Post-operative Plan:   Informed Consent: I have reviewed the patients History and Physical, chart, labs and discussed the procedure including the risks, benefits and alternatives for the proposed anesthesia with the patient or authorized representative who has indicated his/her understanding and acceptance.     Dental advisory given  Plan Discussed with: CRNA  Anesthesia Plan Comments:         Anesthesia Quick Evaluation

## 2023-04-26 NOTE — Progress Notes (Signed)
 Patient transported out of unit by staff X 1 in wheelchair en route to home with grand daughter. Discharge education done with emphasis on medication and follow up appointments.

## 2023-04-26 NOTE — Progress Notes (Signed)
 Patient back to room after having colonoscopy done.

## 2023-04-26 NOTE — Plan of Care (Signed)
  Problem: Education: Goal: Knowledge of General Education information will improve Description: Including pain rating scale, medication(s)/side effects and non-pharmacologic comfort measures 04/26/2023 1006 by Ronney Asters, Billee Cashing, RN Outcome: Completed/Met 04/26/2023 1006 by Ronney Asters, Billee Cashing, RN Outcome: Completed/Met   Problem: Health Behavior/Discharge Planning: Goal: Ability to manage health-related needs will improve 04/26/2023 1006 by Ronney Asters, Billee Cashing, RN Outcome: Completed/Met 04/26/2023 1006 by Ronney Asters, Billee Cashing, RN Outcome: Completed/Met   Problem: Clinical Measurements: Goal: Ability to maintain clinical measurements within normal limits will improve 04/26/2023 1006 by Ronney Asters, Billee Cashing, RN Outcome: Completed/Met 04/26/2023 1006 by Ronney Asters, Billee Cashing, RN Outcome: Completed/Met Goal: Will remain free from infection 04/26/2023 1006 by Ronney Asters, Billee Cashing, RN Outcome: Completed/Met 04/26/2023 1006 by Ronney Asters, Billee Cashing, RN Outcome: Completed/Met Goal: Diagnostic test results will improve 04/26/2023 1006 by Ronney Asters, Billee Cashing, RN Outcome: Completed/Met 04/26/2023 1006 by Ronney Asters, Billee Cashing, RN Outcome: Completed/Met Goal: Respiratory complications will improve 04/26/2023 1006 by Desmond Lope, RN Outcome: Completed/Met 04/26/2023 1006 by Ronney Asters, Billee Cashing, RN Outcome: Completed/Met Goal: Cardiovascular complication will be avoided 04/26/2023 1006 by Ronney Asters, Billee Cashing, RN Outcome: Completed/Met 04/26/2023 1006 by Desmond Lope, RN Outcome: Completed/Met   Problem: Activity: Goal: Risk for activity intolerance will decrease 04/26/2023 1006 by Ronney Asters, Billee Cashing, RN Outcome: Completed/Met 04/26/2023 1006 by Ronney Asters, Jakolby Sedivy, RN Outcome: Completed/Met   Problem: Nutrition: Goal: Adequate nutrition will be maintained 04/26/2023 1006 by Ronney Asters, Billee Cashing, RN Outcome: Completed/Met 04/26/2023 1006 by Ronney Asters, Billee Cashing, RN Outcome: Completed/Met   Problem:  Coping: Goal: Level of anxiety will decrease 04/26/2023 1006 by Ronney Asters, Billee Cashing, RN Outcome: Completed/Met 04/26/2023 1006 by Ronney Asters, Billee Cashing, RN Outcome: Completed/Met   Problem: Elimination: Goal: Will not experience complications related to bowel motility 04/26/2023 1006 by Ronney Asters, Billee Cashing, RN Outcome: Completed/Met 04/26/2023 1006 by Ronney Asters, Billee Cashing, RN Outcome: Completed/Met Goal: Will not experience complications related to urinary retention 04/26/2023 1006 by Ronney Asters, Billee Cashing, RN Outcome: Completed/Met 04/26/2023 1006 by Desmond Lope, RN Outcome: Completed/Met   Problem: Pain Managment: Goal: General experience of comfort will improve and/or be controlled 04/26/2023 1006 by Ronney Asters, Billee Cashing, RN Outcome: Completed/Met 04/26/2023 1006 by Desmond Lope, RN Outcome: Completed/Met   Problem: Safety: Goal: Ability to remain free from injury will improve Outcome: Completed/Met   Problem: Skin Integrity: Goal: Risk for impaired skin integrity will decrease Outcome: Completed/Met

## 2023-04-27 LAB — TYPE AND SCREEN
ABO/RH(D): A NEG
Antibody Screen: POSITIVE
DAT, IgG: NEGATIVE
Unit division: 0
Unit division: 0

## 2023-04-27 LAB — BPAM RBC
Blood Product Expiration Date: 202504192359
Blood Product Expiration Date: 202504202359
ISSUE DATE / TIME: 202503301011
Unit Type and Rh: 202504192359
Unit Type and Rh: 600
Unit Type and Rh: 600

## 2023-05-17 ENCOUNTER — Ambulatory Visit: Payer: Medicare HMO

## 2023-05-17 DIAGNOSIS — I429 Cardiomyopathy, unspecified: Secondary | ICD-10-CM | POA: Diagnosis not present

## 2023-05-18 LAB — CUP PACEART REMOTE DEVICE CHECK
Battery Remaining Longevity: 33 mo
Battery Voltage: 2.96 V
Brady Statistic RV Percent Paced: 99.43 %
Date Time Interrogation Session: 20250422022824
HighPow Impedance: 56 Ohm
Implantable Lead Connection Status: 753985
Implantable Lead Implant Date: 20120920
Implantable Lead Location: 753862
Implantable Pulse Generator Implant Date: 20190809
Lead Channel Impedance Value: 285 Ohm
Lead Channel Impedance Value: 304 Ohm
Lead Channel Pacing Threshold Amplitude: 0.625 V
Lead Channel Pacing Threshold Pulse Width: 0.4 ms
Lead Channel Sensing Intrinsic Amplitude: 8 mV
Lead Channel Sensing Intrinsic Amplitude: 8 mV
Lead Channel Setting Pacing Amplitude: 1.25 V
Lead Channel Setting Pacing Pulse Width: 0.4 ms
Lead Channel Setting Sensing Sensitivity: 0.3 mV
Zone Setting Status: 755011
Zone Setting Status: 755011

## 2023-05-22 ENCOUNTER — Encounter: Payer: Self-pay | Admitting: Cardiology

## 2023-05-31 ENCOUNTER — Other Ambulatory Visit: Payer: Self-pay | Admitting: Physician Assistant

## 2023-06-03 ENCOUNTER — Ambulatory Visit (INDEPENDENT_AMBULATORY_CARE_PROVIDER_SITE_OTHER): Admitting: Surgical

## 2023-06-03 ENCOUNTER — Other Ambulatory Visit (INDEPENDENT_AMBULATORY_CARE_PROVIDER_SITE_OTHER): Payer: Self-pay

## 2023-06-03 DIAGNOSIS — M79671 Pain in right foot: Secondary | ICD-10-CM

## 2023-06-05 ENCOUNTER — Encounter: Payer: Self-pay | Admitting: Surgical

## 2023-06-05 NOTE — Progress Notes (Signed)
 Post-fracture Visit Note   Patient: Lindsey Huber           Date of Birth: [birth year]           MRN: [id number] Visit Date: 06/03/2023 PCP: Tyler Gallant, PA   Assessment & Plan:  Chief Complaint:  Chief Complaint  Patient presents with   Right Foot - Pain   Visit Diagnoses:  1. Pain in right foot     Plan: Patient is a 78 year old female who presents for evaluation of right foot fractures.  Patient sustained injury on 05/21/2023 when she was trying to stop from falling after losing her balance and accidentally jammed her foot extending it up against the walker.  Most of her pain is localized to the base of the great toe.  She is ambulating with rollator and weightbearing in a postop shoe.  Taking Tylenol  with relief.  Pain will occasionally keep her up at night but overall tolerable.  She has no other joint complaints.  Left shoulder is doing well and right ankle is doing well following her prior fractures.  On exam, patient has right foot warm and well-perfused.  Intact ankle dorsiflexion plantarflexion.  She has bruising and ecchymosis noted diffusely through the right forefoot.  Has maximal tenderness at the base of the great toe but also has tenderness at the base of the second toe as well.  No other significant tenderness.  No Lisfranc tenderness.  No pain with subtalar range of motion.  Achilles tendon is palpable and intact.  Plan is continue weightbearing in postop shoe.  She can weight-bear as she can tolerate.  We will see her back in 3 to 4 weeks for clinical recheck with new radiographs at that time.  She does have some other fractures noted on radiographs but these seem older with callus formation already noted.  No real tenderness through the third, fourth, fifth toes.  Follow-Up Instructions: No follow-ups on file.   Orders:  Orders Placed This Encounter  Procedures   XR Foot Complete Right   No orders of the defined types were placed in this  encounter.   Imaging: No results found.  PMFS History: Patient Active Problem List   Diagnosis Date Noted   Asthma, chronic 04/23/2023   Acute GI bleeding 04/23/2023   Gastrointestinal hemorrhage with melena 04/23/2023   History of GI bleed 04/17/2023   Chronic a-fib (HCC) 11/10/2022   S/P AVR 11/10/2022   S/P TVR (tricuspid valve repair) 11/10/2022   S/P mitral valve repair 11/10/2022   Seizure (HCC) 10/26/2022   Mild dementia 08/12/2021   Allergic rhinitis    Chronic gouty arthritis    Epistaxis    Iron deficiency anemia    Long term (current) use of anticoagulants    Malnutrition of mild degree Fabiola Holy: 75% to less than 90% of standard weight)    Oropharyngeal dysphagia    Osteopenia of neck of left femur    Raynaud's disease    Rheumatoid arthritis    History of TIA (transient ischemic attack)    Slow transit constipation    Thrombophilia    Achalasia    Delusional thoughts    Recurrent falls 04/09/2021   Hypokalemia 12/14/2020   Hypocalcemia 12/14/2020   Hypomagnesemia 12/14/2020   Non-toxic multinodular goiter 08/20/2020   Acquired hypothyroidism 08/20/2020   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   Vitamin D  deficiency 05/01/2020   Proteinuria 05/01/2020   Osteoarthritis of knee 05/01/2020   Osteoarthritis of hip 05/01/2020   Gastroesophageal reflux  disease 05/01/2020   Idiopathic pulmonary fibrosis 05/01/2020   Systemic lupus erythematosus 05/01/2020   Chronic heart failure with preserved ejection fraction 05/01/2020   Chronic kidney disease (CKD), active medical management without dialysis, stage 3 (moderate) 05/01/2020   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   Atherosclerosis of abdominal aorta 05/01/2020   Diverticulosis of colon 05/01/2020   Cholelithiasis without obstruction 05/01/2020   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   AICD (automatic cardioverter/defibrillator) present 11/14/2019   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 11/14/2019   Mitral and aortic incompetence 11/14/2019   Hyperlipidemia 11/14/2019   Pain of left hip joint 12/12/2017   Essential (primary) hypertension 08/18/2017   CHB (complete heart block) 08/18/2017   NSVT (nonsustained ventricular tachycardia) 08/18/2017   Aortic valve disorder 03/26/2002   Past Medical History:  Diagnosis Date   ABLA (acute blood loss anemia) 07/04/2022   Achalasia    Acquired hypothyroidism 08/20/2020   Acute lower GI bleeding 11/02/2022   Acute metabolic encephalopathy 07/25/2021   AICD (automatic cardioverter/defibrillator) present 11/14/2019   Allergic rhinitis    Ankle fracture 03/25/2022   Aortic valve disorder 03/26/2002   Atherosclerosis of abdominal aorta (HCC) 05/01/2020   Cardiomyopathy (HCC)    CHB (complete heart block) (HCC) 08/18/2017   CHF (congestive heart failure) (HCC)    Cholelithiasis without obstruction 05/01/2020   Chronic gouty arthritis    Chronic heart failure with preserved ejection fraction (HCC) 05/01/2020   Chronic kidney disease (CKD), active medical management without dialysis, stage 3 (moderate) (HCC) 05/01/2020   Closed torus fracture of distal end of right radius with delayed healing 08/12/2021   Delusional thoughts (HCC)    Diverticulosis of colon 05/01/2020   Epistaxis    Essential (primary) hypertension 08/18/2017   Gastroesophageal reflux disease 05/01/2020   Gastrointestinal hemorrhage    Hemorrhagic shock (HCC) 07/02/2022   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 11/14/2019   Hypercoagulability due to atrial fibrillation (HCC) 05/01/2020   Hyperlipidemia 11/14/2019   Hypocalcemia 12/14/2020   Hypoglycemia    Hypokalemia 12/14/2020   Hypomagnesemia 12/14/2020   Hypotension 12/14/2020   Idiopathic pulmonary fibrosis (HCC) 05/01/2020   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   Iron deficiency anemia    Long term (current) use of anticoagulants     Malnutrition of mild degree Fabiola Holy: 75% to less than 90% of standard weight) (HCC)    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/12/2021   Mitral and aortic incompetence 11/14/2019   Non-rheumatic atrial fibrillation (HCC) 05/01/2020   Non-toxic multinodular goiter 08/20/2020   NSVT (nonsustained ventricular tachycardia) (HCC) 08/18/2017   Oropharyngeal dysphagia    Osteoarthritis of hip 05/01/2020   Osteoarthritis of knee 05/01/2020   Osteopenia of neck of left femur    Pain of left hip joint 12/12/2017   Proteinuria 05/01/2020   Raynaud's disease    Recurrent falls 04/09/2021   Rheumatoid arthritis (HCC)    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   Seizures (HCC)    Septic shock (HCC) 03/10/2020   Slow transit constipation    Systemic lupus erythematosus (HCC) 05/01/2020   Thrombophilia (HCC)    Transient ischemic attack    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   Vitamin D  deficiency     Family History  Problem Relation Age of Onset   Heart disease Mother    Hypertension Mother    Rheum arthritis Mother    Osteoarthritis Mother    Heart disease Father  Hypertension Father    Osteoarthritis Father    Heart disease Sister    Diabetes Brother    Cancer Brother     Past Surgical History:  Procedure Laterality Date   BREAST BIOPSY Left    CARDIAC VALVE SURGERY     CARPAL TUNNEL RELEASE     COLONOSCOPY N/A 04/26/2023   Procedure: COLONOSCOPY;  Surgeon: Evangeline Hilts, MD;  Location: [facility] ENDOSCOPY;  Service: Gastroenterology;  Laterality: N/A;   COLONOSCOPY WITH PROPOFOL  N/A 12/20/2020   Procedure: COLONOSCOPY WITH PROPOFOL ;  Surgeon: Evangeline Hilts, MD;  Location: [facility] ENDOSCOPY;  Service: Endoscopy;  Laterality: N/A;   HEMORRHOID SURGERY     PACEMAKER INSERTION     RIGHT/LEFT HEART CATH AND CORONARY ANGIOGRAPHY N/A 11/27/2020   Procedure: RIGHT/LEFT HEART CATH AND CORONARY ANGIOGRAPHY;  Surgeon: Mardell Shade, MD;  Location: [facility]  INVASIVE CV LAB;  Service: Cardiovascular;  Laterality: N/A;   TONSILLECTOMY     Social History   Occupational History   Occupation: Retired    Comment: Community education officer business  Tobacco Use   Smoking status: Never    Passive exposure: Past   Smokeless tobacco: Never  Vaping Use   Vaping status: Never Used  Substance and Sexual Activity   Alcohol  use: Never   Drug use: Never   Sexual activity: Not Currently

## 2023-06-06 NOTE — Progress Notes (Unsigned)
 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   *** Secondary stroke prevention as managed by cardiiology and primary care. Follow up in 6 months      Subjective:  Lindsey Huber is a 78 year old right-handed female with a fib with [employer] ICD, cardiomyopathy, HTN, CHF, lupus, pulmonary fibrosis, untreated OSA, s/p AVR and CKD who follows up for TIA.  ED notes reviewed.  She is accompanied by her daughter who supplements history.  CT head and CTA head and neck in ED personally reviewed.   UPDATE: Current medications:  Eliquis , Lopressor , Lasix , olanzapine  2.5mg  at bedtime   No agitation or hallucinations at night.  During her hospital admission, she had the   HISTORY:  On 07/01/2020, the patient experienced a mild headache off and on during the day.  She also had a left temporal headache and noted intermittent numbness and tingling in the bilateral lower extremities.  That evening, she was talking with family when she suddenly started holding the right side of her face, eyes became big and she started drooling.  When her family spoke to her, she could only moan.  She was unable to repeat.  She could not smile but did not exhibit facial droop or unilateral weakness.  After 5-6 minutes, she started speaking but it was slurred and didn't make sense.  She was unable to repeat phrases.  She has no recollection of this.  No convulsions or incontinence  She was brought to the ED.  CTA of head and neck personally reviewed was negative for large vessel occlusion or hemodynamically significant stenosis.  Unable to have an MRI due to pacemaker.  She has a fib which is treated with Xarelto .  Routine awake and asleep EEG on 07/14/2020 was  normal.   On the morning of 03/14/2021, she was noted by her daughter to be acting strange.  When she spoke, she had trouble putting words together and did not make sense.  After about an hour, she became unresponsive, started staring off, making moaning sounds and exhibited head twitching and shaking of both hands.  Lasted 2-3 minutes.  No incontinence or tongue biting.  She continued having some slurred speech and difficulty getting words out afterwards.  In hindsight, she says that she does remember feeling strange during this event and struggling to talk.  No lateralizing symptoms such as facial droop or unilateral limb weakness.  EMS was called and symptoms resolved en route to the ED.  2 days prior, she had a fall and landed on her left arm.  CT head personally reviewed revealed no acute intracranial abnormality.  MRI was unable to be performed because there were no [employer] reps that day who could come to the hospital to turn off the pacemaker.  She was found to have cellulitis on her left medial forearm where she had fallen.  Labs revealed no leukocytes and electrolytes normal.  She was discharged on Keflex .    On 09/29/2022, she was sitting at the table when she suddenly started drooling with slight right sided facial droop and slurred speech.  No extremity weakness.  Symptoms lasted 15 minutes.  She was seen in the ED.  CT head revealed advanced chronic small vessel ischemic changes but no acute stroke.  CTA head and neck  revealed tortuosity of the intracranial and extracranial arteries and mild to moderate atheromatous changes within the carotid bifurcations but no LVO or hemodynamically significant stenosis.  As she was already on maximal medical management and back to baseline, she was discharged.   Beginning in 2022, her daughter has been concerned about dementia.  She has been having both visual and auditory hallucinations.  For example, she saw a large spider web in her house that she said was  put up by somebody.   She sometimes hears people outside the window.  She also may exhibit paranoid delusions such as thinking her cousin is calling into radio stations and talking about her, or that her conversations are being broadcast throughout the apartment complex.  This isn't a frequent occurrence.  She sometimes has tremors of the hands.  She lives with her granddaughter and her daughter stops by everyday.  She is able to perform ADLs independently.  No family history of dementia.  B12 from November 2022 was 1,406.  TSH from January 2023 was 1.470.  24 hour ambulatory EEG on 4/5-04/30/2021 was normal.  No events captured.  Unable to get MRI of brain because she has reported abandoned leads.  CT head on 06/18/2021 and  07/14/2021 showed moderate chronic small vessel ischemic changes in the cerebral white matter but no acute findings.  Neuropsychological evaluation on 08/12/2021 was consistent with major neurocognitive disorder possibly indicating early stages of Lewy body dementia or vascular dementia but not suggestive of Alzheimer's, FTD or Parkinson's/atypical parkinson's etiology.  DaT scan performed on 11/11/2022 was within normal limits.  On 10/26/2022, she suddenly became confused and exhibited left sided facial droop.  Speech was altered.  Seen in [facility] ED.  CT head revealed moderate to severe chronic small vessel ischemic changes but no evidence of acute intracranial abnormality.  EEG revealed continuous left frontotemporal slowing with left anterior temporal sharp waves.  She was started on Keppra  and discharged.  She returned to the ED on 10/8 and was admitted for lower GI/rectal bleed.  Treated with Kcentra , vit K and received 3 units of PRBC.  Anticoagulation was discontinued with instructions to resume Eliquis  in 5 days.  Doing well now.  No recurrent spells.   Past medications:  Eliquis  (bleeding)  PAST MEDICAL HISTORY: Past Medical History:  Diagnosis Date   ABLA (acute blood loss  anemia) 07/04/2022   Achalasia    Acquired hypothyroidism 08/20/2020   Acute lower GI bleeding 11/02/2022   Acute metabolic encephalopathy 07/25/2021   AICD (automatic cardioverter/defibrillator) present 11/14/2019   Allergic rhinitis    Ankle fracture 03/25/2022   Aortic valve disorder 03/26/2002   Atherosclerosis of abdominal aorta (HCC) 05/01/2020   Cardiomyopathy (HCC)    CHB (complete heart block) (HCC) 08/18/2017   CHF (congestive heart failure) (HCC)    Cholelithiasis without obstruction 05/01/2020   Chronic gouty arthritis    Chronic heart failure with preserved ejection fraction (HCC) 05/01/2020   Chronic kidney disease (CKD), active medical management without dialysis, stage 3 (moderate) (HCC) 05/01/2020   Closed torus fracture of distal end of right radius with delayed healing 08/12/2021   Delusional thoughts (HCC)    Diverticulosis of colon 05/01/2020   Epistaxis    Essential (primary) hypertension 08/18/2017   Gastroesophageal reflux disease 05/01/2020   Gastrointestinal hemorrhage    Hemorrhagic shock (HCC) 07/02/2022   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 11/14/2019   Hypercoagulability due to atrial fibrillation (HCC) 05/01/2020   Hyperlipidemia 11/14/2019  Hypocalcemia 12/14/2020   Hypoglycemia    Hypokalemia 12/14/2020   Hypomagnesemia 12/14/2020   Hypotension 12/14/2020   Idiopathic pulmonary fibrosis (HCC) 05/01/2020   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   Iron deficiency anemia    Long term (current) use of anticoagulants    Malnutrition of mild degree Fabiola Holy: 75% to less than 90% of standard weight) (HCC)    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/12/2021   Mitral and aortic incompetence 11/14/2019   Non-rheumatic atrial fibrillation (HCC) 05/01/2020   Non-toxic multinodular goiter 08/20/2020   NSVT (nonsustained ventricular tachycardia) (HCC) 08/18/2017   Oropharyngeal dysphagia    Osteoarthritis of hip 05/01/2020   Osteoarthritis of knee  05/01/2020   Osteopenia of neck of left femur    Pain of left hip joint 12/12/2017   Proteinuria 05/01/2020   Raynaud's disease    Recurrent falls 04/09/2021   Rheumatoid arthritis (HCC)    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     albuterol  (VENTOLIN  HFA) 108 (90 Base) MCG/ACT inhaler Inhale 2 puffs into the lungs every 6 (six) hours as needed for wheezing or shortness of breath. 2 each 6   allopurinol  (ZYLOPRIM ) 100 MG tablet Take 1 tablet (100 mg total) by mouth daily. 90 tablet 1   amoxicillin (AMOXIL) 500 MG capsule 4 capsules by mouth 1 hour before dental procedure.     apixaban  (ELIQUIS ) 2.5 MG TABS tablet Take 1 tablet (2.5 mg total) by mouth 2 (two) times daily.     Budeson-Glycopyrrol-Formoterol  (BREZTRI  AEROSPHERE) 160-9-4.8 MCG/ACT AERO Inhale 2 puffs into the lungs 2 (two) times daily as needed ("for flares"). (Patient taking differently: Inhale 2 puffs into the lungs daily as needed ("for flares").) 1 each 11   calcium -vitamin D  (OSCAL WITH D) 500-5 MG-MCG tablet Take 2 tablets by mouth 2 (two) times daily. (Patient taking differently: Take 1 tablet by mouth daily.) 120 tablet 0   cetirizine  HCl (ZYRTEC ) 5 MG/5ML SOLN Take 1 mL (1 mg total) by mouth 2 (two) times daily as needed for rhinitis or allergies. (Patient not taking: Reported on 04/23/2023)     Cholecalciferol  (VITAMIN D3) 25 MCG (1000 UT) CAPS Take 1,000 Units by mouth daily with lunch.     fluticasone  (FLONASE ) 50  MCG/ACT nasal spray PLACE 1 SPRAY INTO BOTH NOSTRILS 2 (TWO) TIMES DAILY 48 mL 2   furosemide  (LASIX ) 40 MG tablet Take 1 tablet (40 mg total) by mouth daily.     hydroxychloroquine  (PLAQUENIL ) 200 MG tablet Take 1 tablet (200 mg total) by mouth daily. 90 tablet 1   levETIRAcetam  (KEPPRA ) 500 MG tablet Take 1 tablet (500 mg total) by mouth 2 (two) times daily. 60 tablet 2   magnesium  oxide (MAG-OX) 400 (240 Mg) MG tablet TAKE 1 TABLET BY MOUTH EVERY DAY 90 tablet 3   Nintedanib  (OFEV ) 150 MG CAPS Take 1 capsule (150 mg total) by mouth at bedtime. 60 capsule 11   OLANZapine  (ZYPREXA ) 2.5 MG tablet TAKE 1 TABLET BY MOUTH EVERYDAY AT BEDTIME 90 tablet 0  pantoprazole  (PROTONIX ) 40 MG tablet Take 1 tablet (40 mg total) by mouth 2 (two) times daily. (Patient taking differently: Take 40 mg by mouth daily.) 60 tablet 1   polyethylene glycol powder (MIRALAX ) 17 GM/SCOOP powder Take 17 g by mouth daily as needed for mild constipation.     potassium chloride  (KLOR-CON  M) 10 MEQ tablet Take 2 tablets (20 mEq total) by mouth daily. Further refill requests for this medication need to be approved by PCP. 60 tablet 0   predniSONE  (DELTASONE ) 5 MG tablet Take 1 tablet (5 mg total) by mouth daily with breakfast. Patient to double up dose during sick day rule 100 tablet 3   No current facility-administered medications on file prior to visit.    ALLERGIES: Allergies  Allergen Reactions   Diprivan  [Propofol ] Shortness Of Breath and Other (See Comments)    "Caused asthma"   Other Other (See Comments)    Patient is to NOT EAT any foods with husks or tree nuts, popcorn   Tambocor [Flecainide] Other (See Comments)    Unknown reaction   Norvasc [Amlodipine] Other (See Comments)    Fatigue Syncope   Pacerone [Amiodarone] Other (See Comments)    Delirium Confusion Psychosis    FAMILY HISTORY: Family History  Problem Relation Age of Onset   Heart disease Mother    Hypertension Mother    Rheum arthritis  Mother    Osteoarthritis Mother    Heart disease Father    Hypertension Father    Osteoarthritis Father    Heart disease Sister    Diabetes Brother    Cancer Brother       Objective:  *** General: No acute distress.  Patient appears well-groomed.   ***   Janne Members, DO  CC: [place]  Ann[place]ell [place], [name]

## 2023-06-07 ENCOUNTER — Ambulatory Visit (INDEPENDENT_AMBULATORY_CARE_PROVIDER_SITE_OTHER): Payer: Medicare HMO | Admitting: Neurology

## 2023-06-07 ENCOUNTER — Encounter: Payer: Self-pay | Admitting: Neurology

## 2023-06-07 VITALS — BP 150/76 | Ht 63.0 in | Wt 118.0 lb

## 2023-06-07 DIAGNOSIS — G40209 Localization-related (focal) (partial) symptomatic epilepsy and epileptic syndromes with complex partial seizures, not intractable, without status epilepticus: Secondary | ICD-10-CM

## 2023-06-07 DIAGNOSIS — F01B18 Vascular dementia, moderate, with other behavioral disturbance: Secondary | ICD-10-CM | POA: Diagnosis not present

## 2023-06-07 MED ORDER — LEVETIRACETAM 500 MG PO TABS: 500.0000 mg | ORAL_TABLET | Freq: Two times a day (BID) | ORAL | 5 refills | Status: DC

## 2023-06-07 NOTE — Patient Instructions (Addendum)
 Keppra  500mg  twice daily  Olanzapine  2.5mg  at bedtime Continue physical therapy

## 2023-06-09 ENCOUNTER — Telehealth (HOSPITAL_COMMUNITY): Payer: Self-pay | Admitting: *Deleted

## 2023-06-09 NOTE — Telephone Encounter (Signed)
 Called to confirm/remind patient of their appointment at [facility] on 06/09/23.    Appointment:              [x] Confirmed             [] Left mess              [] No answer/No voice mail             [] Phone not in service   Patient reminded to bring all medications and/or complete list.   Confirmed patient has transportation. Gave directions, instructed to utilize valet parking.

## 2023-06-10 ENCOUNTER — Encounter: Payer: Self-pay | Admitting: Pulmonary Disease

## 2023-06-10 ENCOUNTER — Ambulatory Visit (INDEPENDENT_AMBULATORY_CARE_PROVIDER_SITE_OTHER): Admitting: Pulmonary Disease

## 2023-06-10 ENCOUNTER — Ambulatory Visit (HOSPITAL_COMMUNITY): Payer: Self-pay | Admitting: Family Medicine

## 2023-06-10 ENCOUNTER — Encounter (HOSPITAL_COMMUNITY): Payer: Self-pay

## 2023-06-10 ENCOUNTER — Ambulatory Visit
Admission: RE | Admit: 2023-06-10 | Discharge: 2023-06-10 | Disposition: A | Discharge status: Home / Self Care | Source: Ambulatory Visit | Attending: Family Medicine | Admitting: Family Medicine

## 2023-06-10 VITALS — BP 170/84 | HR 81 | Wt 116.4 lb

## 2023-06-10 VITALS — BP 150/78 | HR 94 | Ht 63.0 in | Wt 115.2 lb

## 2023-06-10 DIAGNOSIS — I5032 Chronic diastolic (congestive) heart failure: Secondary | ICD-10-CM

## 2023-06-10 DIAGNOSIS — Z952 Presence of prosthetic heart valve: Secondary | ICD-10-CM | POA: Diagnosis not present

## 2023-06-10 DIAGNOSIS — I2721 Secondary pulmonary arterial hypertension: Secondary | ICD-10-CM

## 2023-06-10 DIAGNOSIS — D6869 Other thrombophilia: Secondary | ICD-10-CM

## 2023-06-10 DIAGNOSIS — I4821 Permanent atrial fibrillation: Secondary | ICD-10-CM

## 2023-06-10 DIAGNOSIS — J8410 Pulmonary fibrosis, unspecified: Secondary | ICD-10-CM | POA: Diagnosis not present

## 2023-06-10 DIAGNOSIS — I472 Ventricular tachycardia, unspecified: Secondary | ICD-10-CM

## 2023-06-10 DIAGNOSIS — I429 Cardiomyopathy, unspecified: Secondary | ICD-10-CM | POA: Diagnosis not present

## 2023-06-10 DIAGNOSIS — I110 Hypertensive heart disease with heart failure: Secondary | ICD-10-CM | POA: Insufficient documentation

## 2023-06-10 DIAGNOSIS — I10 Essential (primary) hypertension: Secondary | ICD-10-CM

## 2023-06-10 DIAGNOSIS — Z954 Presence of other heart-valve replacement: Secondary | ICD-10-CM

## 2023-06-10 DIAGNOSIS — I272 Pulmonary hypertension, unspecified: Secondary | ICD-10-CM | POA: Insufficient documentation

## 2023-06-10 DIAGNOSIS — M329 Systemic lupus erythematosus, unspecified: Secondary | ICD-10-CM | POA: Diagnosis not present

## 2023-06-10 DIAGNOSIS — T8209XD Other mechanical complication of heart valve prosthesis, subsequent encounter: Secondary | ICD-10-CM | POA: Diagnosis not present

## 2023-06-10 DIAGNOSIS — J849 Interstitial pulmonary disease, unspecified: Secondary | ICD-10-CM | POA: Diagnosis not present

## 2023-06-10 DIAGNOSIS — J449 Chronic obstructive pulmonary disease, unspecified: Secondary | ICD-10-CM | POA: Diagnosis not present

## 2023-06-10 DIAGNOSIS — Z7901 Long term (current) use of anticoagulants: Secondary | ICD-10-CM | POA: Insufficient documentation

## 2023-06-10 DIAGNOSIS — Z95810 Presence of automatic (implantable) cardiac defibrillator: Secondary | ICD-10-CM | POA: Diagnosis not present

## 2023-06-10 DIAGNOSIS — I495 Sick sinus syndrome: Secondary | ICD-10-CM | POA: Insufficient documentation

## 2023-06-10 DIAGNOSIS — K922 Gastrointestinal hemorrhage, unspecified: Secondary | ICD-10-CM

## 2023-06-10 DIAGNOSIS — G4733 Obstructive sleep apnea (adult) (pediatric): Secondary | ICD-10-CM | POA: Diagnosis not present

## 2023-06-10 DIAGNOSIS — Z9889 Other specified postprocedural states: Secondary | ICD-10-CM

## 2023-06-10 LAB — BRAIN NATRIURETIC PEPTIDE: B Natriuretic Peptide: 511.4 pg/mL — ABNORMAL HIGH (ref 0.0–100.0)

## 2023-06-10 LAB — COMPREHENSIVE METABOLIC PANEL WITH GFR
ALT: 16 U/L (ref 0–44)
AST: 32 U/L (ref 15–41)
Albumin: 3.7 g/dL (ref 3.5–5.0)
Alkaline Phosphatase: 38 U/L (ref 38–126)
Anion gap: 14 (ref 5–15)
BUN: 30 mg/dL — ABNORMAL HIGH (ref 8–23)
CO2: 22 mmol/L (ref 22–32)
Calcium: 9.5 mg/dL (ref 8.9–10.3)
Chloride: 103 mmol/L (ref 98–111)
Creatinine, Ser: 1.18 mg/dL — ABNORMAL HIGH (ref 0.44–1.00)
GFR, Estimated: 48 mL/min — ABNORMAL LOW (ref >60)
Glucose, Bld: 67 mg/dL — ABNORMAL LOW (ref 70–99)
Potassium: 3.9 mmol/L (ref 3.5–5.1)
Sodium: 139 mmol/L (ref 135–145)
Total Bilirubin: 0.8 mg/dL (ref 0.0–1.2)
Total Protein: 7.6 g/dL (ref 6.5–8.1)

## 2023-06-10 NOTE — Progress Notes (Signed)
 ADVANCED HF CLINIC NOTE Primary Care: [name] , [place]  Primary Cardiologist: EP Harvie Liner HF MD: Dr Angelena Kells  HPI: Lindsey Huber is a 78 y.o. female with chronic diastolic CHF, permanent AF, AFL s/p ablation, SLE, pulmonary fibrosis, pulmonary hypertension. paroxysmal VT, OSA not on cpap and valvular disease s/p AVR/MV ring/TVR. Referred by Dr. Marven Slimmer in 10/22 for further evaluation of PAH and valvular heart disease.  Underwent  AVR/MV ring/TVR 09/25/2010.  Paroxysmal VT, ICD placed due to torsades on Flecainide with SSS requiring PPM which predated valve repar/replacement surgery 09/25/2010 and in fact ICD lead was damaged during valve replacement surgery.       History of chronic COPD and pulmonary fibrosis followed by pulmonology.  On triple therapy for COPD vs asthma, nintedanib  for IPF. Followed by Dr Marygrace Snellen, Did not smoke but inhaled second hand smoke from husband and mother pretty much her whole life until 01/2020.   Echo 11/20: EF 63%,normal bioprosthetic tricuspid and aortic valve with mild tricuspid regurgitation, mitral valve repair with mild mitral regurgitation, severe biatrial enlargement.   Admitted in [place], [place] for hypotension and acute kidney injury 03/10/2020.  She was placed on broad-spectrum antibiotics with initial diagnosis of sepsis, no source of infection found also required Levophed  and IVF There was question of whether adrenal insufficiency contributing.  Steroid therapy reinstituted.   Echo 03/11/2020  LVEF 55 to 60%, moderate to severe tricuspid regurgitation, moderately elevated right ventricular systolic pressure estimate 51mm Hg, moderate mitral regurgitation.  RV not well visualized.    Seen in HF Clinic for first visit 10/27/20. Had NYHA IIIB symptoms. Referred for R/L cath--> normal coronaries, EF 60-65%, and normal left sided filling pressures.   R/LHC 11/27/20: Ao =129/63 (89) LV = 141/8 RA = 11 (v waves to 14) RV = 43/4 PA = 42/15 (25) PCW  = 10 Fick cardiac output/index = 5.7/3.7 PVR = 2.1 WU SVR = 1101 PAPi = 2.45 WU. Normal coronary arteries, EF 60-65%, very mild PAH. Normal left-sided filling pressures with no significant v-waves in PCWP tracing to suggest hemodynamically significant MR. No significant stenosis across AoV, MV or TV  Admitted 11/22 with acute LGIB. CT scan showed acute GI hemorrhage involving mid descending colon, likely acute diverticular bleed. Transfused 3u RBCs. Colonoscopy 11/26 which noted hemorrhoids and diverticulosis, no active bleeding, recommended to restart Xarelto  in 3 days if stable.   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  Admitted 3/25 with GIB 2/2 diverticular bleeding. AC held then restarted. Re-admitted later in the month with GI bleeding, AC held agin. Underwent colonoscopy showing hemorrhoids/diverticulosis-no active bleeding.  AC restarted.  Today she returns for HF follow up with her daughter and grandson We have not seen her since 5/24. Overall feeling fine. She is not SOB walking on flat ground with her walker. She has rare atypical CP. Toes are swelling. Denies palpitations, abnormal bleeding, CP, dizziness, or PND/Orthopnea. Appetite ok. Weight at home 116-118 pounds. Taking all medications. Off Toprol , BP at home 125-140. Had a mechanical fast fall 05/21/23, did not hit head. Broke her toes and now in ortho shoes.      Past Medical History:  Diagnosis Date   ABLA (acute blood loss anemia) 07/04/2022   Achalasia    Acquired hypothyroidism 08/20/2020   Acute lower GI bleeding 11/02/2022   Acute metabolic encephalopathy 07/25/2021   AICD (automatic  cardioverter/defibrillator) present 11/14/2019   Allergic rhinitis    Ankle fracture 03/25/2022   Aortic valve disorder 03/26/2002   Atherosclerosis of abdominal aorta (HCC) 05/01/2020   Cardiomyopathy (HCC)    CHB  (complete heart block) (HCC) 08/18/2017   CHF (congestive heart failure) (HCC)    Cholelithiasis without obstruction 05/01/2020   Chronic gouty arthritis    Chronic heart failure with preserved ejection fraction (HCC) 05/01/2020   Chronic kidney disease (CKD), active medical management without dialysis, stage 3 (moderate) (HCC) 05/01/2020   Closed torus fracture of distal end of right radius with delayed healing 08/12/2021   Delusional thoughts (HCC)    Diverticulosis of colon 05/01/2020   Epistaxis    Essential (primary) hypertension 08/18/2017   Gastroesophageal reflux disease 05/01/2020   Gastrointestinal hemorrhage    Hemorrhagic shock (HCC) 07/02/2022   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 11/14/2019   Hypercoagulability due to atrial fibrillation (HCC) 05/01/2020   Hyperlipidemia 11/14/2019   Hypocalcemia 12/14/2020   Hypoglycemia    Hypokalemia 12/14/2020   Hypomagnesemia 12/14/2020   Hypotension 12/14/2020   Idiopathic pulmonary fibrosis (HCC) 05/01/2020   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   Iron deficiency anemia    Long term (current) use of anticoagulants    Malnutrition of mild degree Lindsey Huber: 75% to less than 90% of standard weight) (HCC)    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/12/2021   Mitral and aortic incompetence 11/14/2019   Non-rheumatic atrial fibrillation (HCC) 05/01/2020   Non-toxic multinodular goiter 08/20/2020   NSVT (nonsustained ventricular tachycardia) (HCC) 08/18/2017   Oropharyngeal dysphagia    Osteoarthritis of hip 05/01/2020   Osteoarthritis of knee 05/01/2020   Osteopenia of neck of left femur    Pain of left hip joint 12/12/2017   Proteinuria 05/01/2020   Raynaud's disease    Recurrent falls 04/09/2021   Rheumatoid arthritis (HCC)    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     albuterol  (VENTOLIN  HFA) 108 (90 Base) MCG/ACT inhaler Inhale 2 puffs into the lungs every 6 (six) hours as needed for wheezing or shortness of breath. 2 each 6   allopurinol  (ZYLOPRIM ) 100 MG tablet Take 1 tablet (100 mg total) by mouth daily. 90 tablet 1   amoxicillin (AMOXIL) 500 MG capsule 4 capsules by mouth 1 hour before dental procedure.     apixaban  (ELIQUIS ) 2.5 MG TABS tablet Take 1 tablet (2.5 mg total) by mouth 2 (two) times daily.     Budeson-Glycopyrrol-Formoterol  (BREZTRI  AEROSPHERE) 160-9-4.8 MCG/ACT AERO Inhale 2 puffs into the lungs 2 (two) times daily as needed ("for flares"). 1 each 11   calcium -vitamin D  (OSCAL WITH D) 500-5 MG-MCG tablet Take 2 tablets by mouth 2 (two) times daily. 120 tablet 0   cetirizine  HCl (ZYRTEC ) 5 MG/5ML SOLN Take 1 mL (1 mg total) by mouth 2 (two) times daily as needed for rhinitis or allergies.     Cholecalciferol  (VITAMIN D3) 25 MCG (1000 UT) CAPS Take 1,000 Units by mouth daily with lunch.     fluticasone  (FLONASE )  50 MCG/ACT nasal spray Place 1 spray into both nostrils as needed for allergies or rhinitis.     furosemide  (LASIX ) 40 MG tablet Take 1 tablet (40 mg total) by mouth daily.     hydroxychloroquine  (PLAQUENIL ) 200 MG tablet Take 1 tablet (200 mg total) by mouth daily. 90 tablet 1   levETIRAcetam  (KEPPRA ) 500 MG tablet Take 1 tablet (500 mg total) by mouth 2 (two) times daily. 60 tablet 5   magnesium  oxide (MAG-OX) 400 (240 Mg) MG tablet TAKE 1 TABLET BY MOUTH EVERY DAY 90 tablet 3   Nintedanib  (OFEV ) 150 MG CAPS Take 1 capsule (150 mg total) by mouth at bedtime. 60 capsule 11   OLANZapine  (ZYPREXA ) 2.5 MG tablet TAKE 1  TABLET BY MOUTH EVERYDAY AT BEDTIME 90 tablet 0   pantoprazole  (PROTONIX ) 40 MG tablet Take 1 tablet (40 mg total) by mouth 2 (two) times daily. 60 tablet 1   polyethylene glycol powder (MIRALAX ) 17 GM/SCOOP powder Take 17 g by mouth daily as needed for mild constipation.     potassium chloride  (KLOR-CON  M) 10 MEQ tablet Take 2 tablets (20 mEq total) by mouth daily. Further refill requests for this medication need to be approved by PCP. 60 tablet 0   predniSONE  (DELTASONE ) 5 MG tablet Take 1 tablet (5 mg total) by mouth daily with breakfast. Patient to double up dose during sick day rule 100 tablet 3   No current facility-administered medications for this encounter.   Allergies  Allergen Reactions   Diprivan  [Propofol ] Shortness Of Breath and Other (See Comments)    "Caused asthma"   Other Other (See Comments)    Patient is to NOT EAT any foods with husks or tree nuts, popcorn   Tambocor [Flecainide] Other (See Comments)    Unknown reaction   Norvasc [Amlodipine] Other (See Comments)    Fatigue Syncope   Pacerone [Amiodarone] Other (See Comments)    Delirium Confusion Psychosis   Social History   Socioeconomic History   Marital status: Legally Separated    Spouse name: Not on file   Number of children: Not on file   Years of education: 16   Highest education level: Bachelor's degree (e.g., BA, AB, BS)  Occupational History   Occupation: Retired    Comment: Community education officer business  Tobacco Use   Smoking status: Never    Passive exposure: Past   Smokeless tobacco: Never  Vaping Use   Vaping status: Never Used  Substance and Sexual Activity   Alcohol  use: Never   Drug use: Never   Sexual activity: Not Currently  Other Topics Concern   Not on file  Social History Narrative   Right Handed    Social Drivers of Health   Financial Resource Strain: Not on file  Food Insecurity: No Food Insecurity (04/23/2023)   Hunger Vital Sign    Worried About Running Out of Food in the Last  Year: Never true    Ran Out of Food in the Last Year: Never true  Transportation Needs: No Transportation Needs (04/23/2023)   PRAPARE - Administrator, Civil Service (Medical): No    Lack of Transportation (Non-Medical): No  Physical Activity: Not on file  Stress: Not on file  Social Connections: Moderately Isolated (04/23/2023)   Social Connection and Isolation Panel [NHANES]    Frequency of Communication with Friends and Family: More than three times a week    Frequency of Social Gatherings with Friends and Family: Three times a week  Attends Religious Services: More than 4 times per year    Active Member of Clubs or Organizations: No    Attends Banker Meetings: Never    Marital Status: Separated  Intimate Partner Violence: Not At Risk (04/23/2023)   Humiliation, Afraid, Rape, and Kick questionnaire    Fear of Current or Ex-Partner: No    Emotionally Abused: No    Physically Abused: No    Sexually Abused: No   Family History  Problem Relation Age of Onset   Heart disease Mother    Hypertension Mother    Rheum arthritis Mother    Osteoarthritis Mother    Heart disease Father    Hypertension Father    Osteoarthritis Father    Heart disease Sister    Diabetes Brother    Cancer Brother    BP (!) 170/84   Pulse 81   Wt 52.8 kg (116 lb 6.4 oz)   SpO2 100%   BMI 20.62 kg/m   Wt Readings from Last 3 Encounters:  06/10/23 52.8 kg (116 lb 6.4 oz)  06/07/23 53.5 kg (118 lb)  04/23/23 52.6 kg (116 lb)   PHYSICAL EXAM: General:  NAD. No resp difficulty, elderly, walked into clinic with walker. HEENT: Normal Neck: Supple. No JVD. Cor: Regular rate & rhythm. No rubs, gallops or murmurs. Lungs: Clear, diminished in bases Abdomen: Soft, nontender, nondistended.  Extremities: No cyanosis, clubbing, rash, edema; R ortho shoe on Neuro: Alert & oriented x 3, moves all 4 extremities w/o difficulty. Affect pleasant.   ICD interrogation (personally  reviewed):OptiVol ok but slowly creeping up, 0.2 hr/day activity, 99.4% VP, no VT  ECG (personally reviewed from 04/25/23): V paced  ASSESSMENT & PLAN: Chronic Diastolic CHF due to valvular heart disease - s/p bioprosthetic AVR/MV ring/bioprosthetic TVR 09/25/2010 - Echo 03/11/2020  LVEF 55 to 60%, moderate to severe tricuspid regurgitation, moderately elevated right ventricular systolic pressure estimate 51mm Hg, moderate mitral regurgitation.  RV not well visualized. AV function not assessed. - RHC 11/22: Mild PAH. 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 Volume looks OK on exam and OptiVol. - Off Toprol  with low BP - Continue Lasix  40 mg daily +20 KCL daily - Echo showed potential progressive prosthetic valve stenosis. Ideally would proceed with TEE, but given her overall debility currently, worry the risk >benefit and that she would likely not be good candidate for further valvular procedures. - We discussed this again today and she verbalizes not wanting any invasive procedures. - Update echo next visit - Labs today.  2. Pulmonary HTN by ECHO - RHC 11/22 with mild PAH and normal PVR. (See above) - Likely due to valvular disease primarily - Follow on echo  3. ILD - 11/2018 HRCT with stable ILD compared with 2018.  2018 CT Slowly progressive basal predominant, moderately severe pulmonary fibrosis. Favor NSIP - no visible PFT's on care everywhere - Followed by pulmonary   - No change.  4. OSA - Previously on CPAP, but resolved with weight loss - No change  5. Permanent  Afib  with SSS - has ICD in place due to h/o VT  - Followed by Dr. Marven Slimmer - On Eliquis . - May need to consider discontinuation of AC with frequent falls and GIB  6. H/o VT - s/p ICD  - No recent VT per interrogation as above.  7. GIB - Colonoscopy 2022: Left diverticulosis - Colonoscopy 3/25: no active bleeding, suspect bleeding from colonic diverticula - 2 admission in 2025 for bleeds - Followed by GI - Consider stopping AC?  8. SLE - Followed by Dr. Rodell Citrin, lupus felt to be under good control - Continued on HCQ and she is also on 5 mg prednisone  for secondary adrenal insufficiency  9. HTN - BP up today, generally well-controlled at home on current meds - I asked her to check BP at home and notify clinic if sBP consistently > 140  Follow up 3-4 months with Dr. Julane Ny + echo  Elmarie Hacking, FNP  9:06 AM 06/10/23   [facility] [address] [facility] [place] [address] [phone] (office) [phone] (fax)

## 2023-06-10 NOTE — Progress Notes (Signed)
 Electrophysiology Office Note:   Date:  06/10/2023  ID:  Lindsey Huber, DOB 03-14-1945, MRN [id number]  Primary Cardiologist: None Primary Heart Failure: None Electrophysiologist: Boyce Byes, MD       History of Present Illness:   Lindsey Huber is a 78 y.o. female with h/o  ICM s/p ICD, permanent AF on OAC, VHD s/p aortic, mitral & tricuspid valve repair, VT (torsades in setting of flecainide), HFpEF, HTN, HLD, CKD III, SLE, Lewy Body dementia, idiopathic pulmonary fibrosis, multiple GIB 2024 requiring transfusion / reversal of DOAC, GERD, dysphagia, malnutrition seen today for routine electrophysiology followup.   Since last being seen in our clinic the patient reports she feels fatigued.  This is not new for her.  She was seen in the heart failure clinic this morning and had labs drawn and they are pending.  She has not had further known bleeding episodes on Eliquis .  Reports some lower extremity swelling but no significant weight gain.  She denies chest pain, palpitations, dyspnea, PND, orthopnea, nausea, vomiting, dizziness, syncope, edema, weight gain, or early satiety.   Review of systems complete and found to be negative unless listed in HPI.   EP Information / Studies Reviewed:    EKG is not ordered today. EKG from 04/25/23 reviewed which showed VP 74 bpm      ICD Interrogation-  reviewed in detail today,  See PACEART report.  Device History: Medtronic Single Chamber ICD implanted 10/15/2010 (in [place] ), generator change 2019 for ICM History of appropriate therapy: Yes, ATP 2022, HV therapy 2012 History of AAD therapy: Torsades on Flecainide     Arrhythmia / Device  Medtronic CareLink ICD   05/2006 original device implant, SSS, atrial standstill, advanced Wenckebach, VT/VF arrest (torsades), mixed connective tissue disease  09/2010 MDT single chamber ICD 08/2017 Gen change, possible damage to lead during valvular surgery  CXR shows abandoned RA lead, &  broken/abandoned dual coil RV lead 06/2022 Device Interrogation > normal device function, battery/lead parameters stable, no VT. OptiVol elevated (post hospital). No R waves at 40   AAD / Anticoagulation  01/2020 Established with Dr. Marven Slimmer after move from [place]  Intolerant to Flecainide > torsades  Xarelto  15mg  > changed to Eliquis  due to second occurrence of GIB 10/2022    Studies 11/2020 R/LHC > normal coronary arteries, LVEF 60-65%, very mild PAH, normal left-sided filling pressures with no v-waves in PCWP tracing to suggest hemodynamically significant MR, no significant stenosis AoV, MV, or TV 02/2022 ECHO > LVEF 60-65%, mild concentric LV hypertrophy, bioprosthetic AVR, MVR, RVSP 52.9, LA/RA severely dilated     Risk Assessment/Calculations:    CHA2DS2-VASc Score = 6   This indicates a 9.7% annual risk of stroke. The patient's score is based upon: CHF History: 1 HTN History: 1 Diabetes History: 0 Stroke History: 0 Vascular Disease History: 1 Age Score: 2 Gender Score: 1    HYPERTENSION CONTROL Vitals:   06/10/23 1044 06/10/23 1055  BP: (!) 180/97 (!) 150/78    The patient's blood pressure is elevated above target today.  In order to address the patient's elevated BP: Blood pressure will be monitored at home to determine if medication changes need to be made.           Physical Exam:   VS:  BP (!) 150/78   Pulse 94   Ht 5\' 3"  (1.6 m)   Wt 115 lb 3.2 oz (52.3 kg)   SpO2 97%   BMI 20.41 kg/m    Wt Readings from  Last 3 Encounters:  06/10/23 115 lb 3.2 oz (52.3 kg)  06/10/23 116 lb 6.4 oz (52.8 kg)  06/07/23 118 lb (53.5 kg)     GEN: Well nourished, well developed in no acute distress NECK: No JVD; No carotid bruits CARDIAC: Regular rate and rhythm, no murmurs, rubs, gallops RESPIRATORY:  Clear to auscultation without rales, wheezing or rhonchi  ABDOMEN: Soft, non-tender, non-distended EXTREMITIES:  No edema; No deformity   ASSESSMENT AND PLAN:     Chronic Systolic Dysfunction / ICM s/p Medtronic single chamber ICD  VT Has abandoned leads. No R waves at 40bpm -euvolemic on exam  -Stable on an appropriate medical regimen -Normal ICD function -See Pace Art report -No changes today    Permanent AF  CHA2DS2-VASc 6. 2 GIB's in 2024 that required PRBC / reversal of DOAC, large hip hematoma.  Changed from Xarelto  to Eliquis   -continue OAC for stroke prophylaxis  -continue lopressor    Secondary Hypercoagulable State  -continue eliquis  2.5 mg BID > dose reviewed, discharged after 2nd GIB on 2.5mg   -reviewed patient's Eliquis  dosing with her and her daughter.  Patient currently weighs 52 kg, creatinine is less than 1.5 and her age is less than 80.  Based on these criteria she technically should be on 5 mg twice daily.  She was discharged from hospital on 2.5 mg twice daily.  I am unable to find the indication but presume that this is related to significant bleeding events on prior DOAC (Xarelto ), ongoing anemia and fall risk.  Patient has not had evidence of atrial fibrillation.  Reviewed current dosing may not actually fully cover her for stroke risk.  Family indicates understanding in regards to dose of medication and balancing anticoagulation with risk of bleeding.  Chronic HFpEF  -euvolemic on exam & by device   Valvular Heart Disease  S/p bioprosthetic AVR, excision of subaortic membrane, mitral valve annuloplasty, TV repair followed by replacement    PH  IPF in setting of Connective Tissue Disease  -follows with Pulmonary & Advanced HF Team   Hypertension  -elevated in clinic, patient reports it typically runs 120-140 systolic at home.  She reports she is typically elevated in clinic on the first assessment.   Disposition:   Follow up with Dr. Marven Slimmer or EP APP in 6 months   Signed, Creighton Doffing, NP-C, AGACNP-BC [facility] - Electrophysiology  06/10/2023, 5:02 PM

## 2023-06-10 NOTE — Patient Instructions (Addendum)
 No change in medications. Labs today - will call you if abnormal. Check BP at home and call us  if SBP consistently > 140. Return to see Dr. Julane Ny with echo in 3 months. PLEASE CALL US  AT [phone] IN JULY TO SCHEDULE THIS APPOINTMENT. Please call us  at [phone] if any questions or concerns prior to your next appointment.

## 2023-06-10 NOTE — Patient Instructions (Signed)
 Medication Instructions:  No medications changes today   *If you need a refill on your cardiac medications before your next appointment, please call your pharmacy*  Lab Work: No lab work today If you have labs (blood work) drawn today and your tests are completely normal, you will receive your results only by: MyChart Message (if you have MyChart) OR A paper copy in the mail If you have any lab test that is abnormal or we need to change your treatment, we will call you to review the results.  Testing/Procedures: No testing/procedures were scheduled today  Follow-Up: At [facility], you and your health needs are our priority.  As part of our continuing mission to provide you with exceptional heart care, our providers are all part of one team.  This team includes your primary Cardiologist (physician) and Advanced Practice Providers or APPs (Physician Assistants and Nurse Practitioners) who all work together to provide you with the care you need, when you need it.  Your next appointment:   6 month(s)  Provider:   Creighton Doffing, NP    We recommend signing up for the patient portal called "MyChart".  Sign up information is provided on this After Visit Summary.  MyChart is used to connect with patients for Virtual Visits (Telemedicine).  Patients are able to view lab/test results, encounter notes, upcoming appointments, etc.  Non-urgent messages can be sent to your provider as well.   To learn more about what you can do with MyChart, go to [email or web address].

## 2023-06-13 NOTE — Telephone Encounter (Addendum)
 Spoke with daughter Moira Andrews, made changes given and repeated back, labs scheduled and ordered   ----- Message from Elmarie Hacking sent at 06/10/2023  4:32 PM EDT ----- BNP creeping up, blood glucose low, need to watch sugars  Increase lasix  to 60 mg on MWF, keep 40 mg other days. Repeat BMET in 10-14 days  Take 40 KCL on MWF (with higher dose of Lasix ), keep 20 on other days

## 2023-06-30 NOTE — Progress Notes (Signed)
 Remote ICD transmission.

## 2023-06-30 NOTE — Addendum Note (Signed)
 Addended by: Lott Rouleau A on: 06/30/2023 01:41 PM   Modules accepted: Orders

## 2023-07-01 ENCOUNTER — Other Ambulatory Visit (HOSPITAL_COMMUNITY)

## 2023-07-01 ENCOUNTER — Ambulatory Visit: Payer: Medicare HMO | Admitting: Internal Medicine

## 2023-07-01 ENCOUNTER — Ambulatory Visit: Admitting: Surgical

## 2023-07-02 ENCOUNTER — Other Ambulatory Visit: Payer: Self-pay | Admitting: Pulmonary Disease

## 2023-07-02 ENCOUNTER — Other Ambulatory Visit: Payer: Self-pay | Admitting: Cardiology

## 2023-07-06 ENCOUNTER — Ambulatory Visit: Payer: Medicare HMO | Admitting: Internal Medicine

## 2023-07-07 ENCOUNTER — Ambulatory Visit (HOSPITAL_COMMUNITY)
Admission: RE | Admit: 2023-07-07 | Discharge: 2023-07-07 | Disposition: A | Discharge status: Home / Self Care | Source: Ambulatory Visit | Attending: Cardiology | Admitting: Cardiology

## 2023-07-07 DIAGNOSIS — I5032 Chronic diastolic (congestive) heart failure: Secondary | ICD-10-CM | POA: Insufficient documentation

## 2023-07-07 LAB — COMPREHENSIVE METABOLIC PANEL WITH GFR
ALT: 23 U/L (ref 0–44)
AST: 40 U/L (ref 15–41)
Albumin: 3.5 g/dL (ref 3.5–5.0)
Alkaline Phosphatase: 46 U/L (ref 38–126)
Anion gap: 11 (ref 5–15)
BUN: 20 mg/dL (ref 8–23)
CO2: 25 mmol/L (ref 22–32)
Calcium: 9.1 mg/dL (ref 8.9–10.3)
Chloride: 100 mmol/L (ref 98–111)
Creatinine, Ser: 1.1 mg/dL — ABNORMAL HIGH (ref 0.44–1.00)
GFR, Estimated: 52 mL/min — ABNORMAL LOW (ref >60)
Glucose, Bld: 79 mg/dL (ref 70–99)
Potassium: 4.1 mmol/L (ref 3.5–5.1)
Sodium: 136 mmol/L (ref 135–145)
Total Bilirubin: 1.1 mg/dL (ref 0.0–1.2)
Total Protein: 7.4 g/dL (ref 6.5–8.1)

## 2023-07-08 ENCOUNTER — Encounter: Payer: Self-pay | Admitting: Cardiology

## 2023-07-08 ENCOUNTER — Ambulatory Visit (HOSPITAL_COMMUNITY): Payer: Self-pay | Admitting: Family Medicine

## 2023-07-11 ENCOUNTER — Ambulatory Visit: Payer: Medicare HMO | Admitting: Neurology

## 2023-07-14 ENCOUNTER — Other Ambulatory Visit (INDEPENDENT_AMBULATORY_CARE_PROVIDER_SITE_OTHER): Payer: Self-pay

## 2023-07-14 ENCOUNTER — Ambulatory Visit: Admitting: Surgical

## 2023-07-14 DIAGNOSIS — M79671 Pain in right foot: Secondary | ICD-10-CM
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
## 2023-07-17 ENCOUNTER — Encounter: Payer: Self-pay | Admitting: Surgical

## 2023-07-17 NOTE — Progress Notes (Signed)
 Post-fracture Visit Note   Patient: Lindsey Huber           Date of Birth: 1945/09/22           MRN: [id number] Visit Date: 07/14/2023 PCP: [name], [name], PA   Assessment & Plan:  Chief Complaint:  Chief Complaint  Patient presents with   Right Foot - Follow-up, Fracture    DOI 05/21/2023   Visit Diagnoses:  1. Pain in right foot     Plan: Patient is a 78 year old female who returns for evaluation of right foot fracture of the proximal phalanx of the great toe and subacute fractures of multiple distal metatarsals.  Date of the great toe fracture was 05/21/2023.  Overall she has minimal discomfort in the foot.  Has been ambulatory with rollator and fracture shoe.  She has discontinued this for a regular sneaker and has had minimal pain when she tried this a few days ago.  She has no tenderness over the fracture sites in the right foot.  She does have 1+ DP pulse of the right lower extremity.  Mild swelling noted but no significant deformity or skin injury.  With lack of tenderness and the callus formation noted on radiographs today, plan for her to follow-up as needed.  Welcome to return if she has any significant discomfort.  She will transition to a regular shoe at this point.  Did provide her with right hinged knee brace today.  Follow-Up Instructions: Return if symptoms worsen or fail to improve.   Orders:  Orders Placed This Encounter  Procedures   XR Foot Complete Right   No orders of the defined types were placed in this encounter.   Imaging: No results found.  PMFS History: Patient Active Problem List   Diagnosis Date Noted   Asthma, chronic 04/23/2023   Acute GI bleeding 04/23/2023   Gastrointestinal hemorrhage with melena 04/23/2023   History of GI bleed 04/17/2023   Chronic a-fib (HCC) 11/10/2022   S/P AVR 11/10/2022   S/P TVR (tricuspid valve repair) 11/10/2022   S/P mitral valve repair 11/10/2022   Seizure (HCC) 10/26/2022   Mild dementia  08/12/2021   Allergic rhinitis    Chronic gouty arthritis    Epistaxis    Iron deficiency anemia    Long term (current) use of anticoagulants    Malnutrition of mild degree [name]: 75% to less than 90% of standard weight)    Oropharyngeal dysphagia    Osteopenia of neck of left femur    Raynaud's disease    Rheumatoid arthritis    History of TIA (transient ischemic attack)    Slow transit constipation    Thrombophilia    Achalasia    Delusional thoughts    Recurrent falls 04/09/2021   Hypokalemia 12/14/2020   Hypocalcemia 12/14/2020   Hypomagnesemia 12/14/2020   Non-toxic multinodular goiter 08/20/2020   Acquired hypothyroidism 08/20/2020   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   Vitamin D  deficiency 05/01/2020   Proteinuria 05/01/2020   Osteoarthritis of knee 05/01/2020   Osteoarthritis of hip 05/01/2020   Gastroesophageal reflux disease 05/01/2020   Idiopathic pulmonary fibrosis 05/01/2020   Systemic lupus erythematosus 05/01/2020   Chronic heart failure with preserved ejection fraction 05/01/2020   Chronic kidney disease (CKD), active medical management without dialysis, stage 3 (moderate) 05/01/2020   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   Atherosclerosis of abdominal aorta 05/01/2020   Diverticulosis of colon 05/01/2020   Cholelithiasis without obstruction 05/01/2020   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  AICD (automatic cardioverter/defibrillator) present 11/14/2019   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 11/14/2019   Mitral and aortic incompetence 11/14/2019   Hyperlipidemia 11/14/2019   Pain of left hip joint 12/12/2017   Essential (primary) hypertension 08/18/2017   CHB (complete heart block) 08/18/2017   NSVT (nonsustained ventricular tachycardia) 08/18/2017   Aortic valve disorder 03/26/2002   Past Medical History:  Diagnosis Date   ABLA (acute blood loss anemia) 07/04/2022   Achalasia     Acquired hypothyroidism 08/20/2020   Acute lower GI bleeding 11/02/2022   Acute metabolic encephalopathy 07/25/2021   AICD (automatic cardioverter/defibrillator) present 11/14/2019   Allergic rhinitis    Ankle fracture 03/25/2022   Aortic valve disorder 03/26/2002   Atherosclerosis of abdominal aorta (HCC) 05/01/2020   Cardiomyopathy (HCC)    CHB (complete heart block) (HCC) 08/18/2017   CHF (congestive heart failure) (HCC)    Cholelithiasis without obstruction 05/01/2020   Chronic gouty arthritis    Chronic heart failure with preserved ejection fraction (HCC) 05/01/2020   Chronic kidney disease (CKD), active medical management without dialysis, stage 3 (moderate) (HCC) 05/01/2020   Closed torus fracture of distal end of right radius with delayed healing 08/12/2021   Delusional thoughts (HCC)    Diverticulosis of colon 05/01/2020   Epistaxis    Essential (primary) hypertension 08/18/2017   Gastroesophageal reflux disease 05/01/2020   Gastrointestinal hemorrhage    Hemorrhagic shock (HCC) 07/02/2022   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 11/14/2019   Hypercoagulability due to atrial fibrillation (HCC) 05/01/2020   Hyperlipidemia 11/14/2019   Hypocalcemia 12/14/2020   Hypoglycemia    Hypokalemia 12/14/2020   Hypomagnesemia 12/14/2020   Hypotension 12/14/2020   Idiopathic pulmonary fibrosis (HCC) 05/01/2020   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   Iron deficiency anemia    Long term (current) use of anticoagulants    Malnutrition of mild degree [name]: 75% to less than 90% of standard weight) (HCC)    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/12/2021   Mitral and aortic incompetence 11/14/2019   Non-rheumatic atrial fibrillation (HCC) 05/01/2020   Non-toxic multinodular goiter 08/20/2020   NSVT (nonsustained ventricular tachycardia) (HCC) 08/18/2017   Oropharyngeal dysphagia    Osteoarthritis of hip 05/01/2020   Osteoarthritis of knee 05/01/2020   Osteopenia of neck  of left femur    Pain of left hip joint 12/12/2017   Proteinuria 05/01/2020   Raynaud's disease    Recurrent falls 04/09/2021   Rheumatoid arthritis (HCC)    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   Seizures (HCC)    Septic shock (HCC) 03/10/2020   Slow transit constipation    Systemic lupus erythematosus (HCC) 05/01/2020   Thrombophilia (HCC)    Transient ischemic attack    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   Vitamin D  deficiency     Family History  Problem Relation Age of Onset   Heart disease Mother    Hypertension Mother    Rheum arthritis Mother    Osteoarthritis Mother    Heart disease Father    Hypertension Father    Osteoarthritis Father    Heart disease Sister    Diabetes Brother    Cancer Brother     Past Surgical History:  Procedure Laterality Date   BREAST BIOPSY Left    CARDIAC VALVE SURGERY     CARPAL TUNNEL RELEASE     COLONOSCOPY N/A 04/26/2023   Procedure: COLONOSCOPY;  Surgeon: Burnette Fallow, MD;  Location: [facility] ENDOSCOPY;  Service: Gastroenterology;  Laterality: N/A;   COLONOSCOPY WITH PROPOFOL  N/A 12/20/2020   Procedure: COLONOSCOPY WITH PROPOFOL ;  Surgeon: Burnette Fallow, MD;  Location: [facility] ENDOSCOPY;  Service: Endoscopy;  Laterality: N/A;   HEMORRHOID SURGERY     PACEMAKER INSERTION     RIGHT/LEFT HEART CATH AND CORONARY ANGIOGRAPHY N/A 11/27/2020   Procedure: RIGHT/LEFT HEART CATH AND CORONARY ANGIOGRAPHY;  Surgeon: Cherrie Toribio SAUNDERS, MD;  Location: [facility] INVASIVE CV LAB;  Service: Cardiovascular;  Laterality: N/A;   TONSILLECTOMY     Social History   Occupational History   Occupation: Retired    Comment: Community education officer business  Tobacco Use   Smoking status: Never    Passive exposure: Past   Smokeless tobacco: Never  Vaping Use   Vaping status: Never Used  Substance and Sexual Activity   Alcohol  use: Never   Drug use: Never   Sexual activity: Not Currently

## 2023-07-28 ENCOUNTER — Ambulatory Visit: Attending: Internal Medicine | Admitting: Internal Medicine

## 2023-07-28 ENCOUNTER — Encounter: Payer: Self-pay | Admitting: Internal Medicine

## 2023-07-28 VITALS — BP 165/90 | HR 73 | Resp 16 | Ht 63.0 in | Wt 119.0 lb

## 2023-07-28 DIAGNOSIS — M170 Bilateral primary osteoarthritis of knee: Secondary | ICD-10-CM

## 2023-07-28 DIAGNOSIS — I7300 Raynaud's syndrome without gangrene: Secondary | ICD-10-CM

## 2023-07-28 DIAGNOSIS — M1A9XX0 Chronic gout, unspecified, without tophus (tophi): Secondary | ICD-10-CM

## 2023-07-28 DIAGNOSIS — Z7952 Long term (current) use of systemic steroids: Secondary | ICD-10-CM | POA: Diagnosis not present

## 2023-07-28 DIAGNOSIS — Z79899 Other long term (current) drug therapy: Secondary | ICD-10-CM

## 2023-07-28 DIAGNOSIS — Z5181 Encounter for therapeutic drug level monitoring: Secondary | ICD-10-CM | POA: Diagnosis not present

## 2023-07-28 DIAGNOSIS — M329 Systemic lupus erythematosus, unspecified: Secondary | ICD-10-CM

## 2023-07-28 DIAGNOSIS — J84112 Idiopathic pulmonary fibrosis: Secondary | ICD-10-CM

## 2023-07-28 MED ORDER — HYDROXYCHLOROQUINE SULFATE 200 MG PO TABS: 200.0000 mg | ORAL_TABLET | Freq: Every day | ORAL | 1 refills | Status: DC

## 2023-07-29 LAB — CBC WITH DIFFERENTIAL/PLATELET
Absolute Lymphocytes: 1779 {cells}/uL (ref 850–3900)
Absolute Monocytes: 614 {cells}/uL (ref 200–950)
Basophils Absolute: 19 {cells}/uL (ref 0–200)
Basophils Relative: 0.3 %
Eosinophils Absolute: 51 {cells}/uL (ref 15–500)
Eosinophils Relative: 0.8 %
HCT: 35.4 % (ref 35.0–45.0)
Hemoglobin: 11 g/dL — ABNORMAL LOW (ref 11.7–15.5)
MCH: 31 pg (ref 27.0–33.0)
MCHC: 31.1 g/dL — ABNORMAL LOW (ref 32.0–36.0)
MCV: 99.7 fL (ref 80.0–100.0)
MPV: 10.4 fL (ref 7.5–12.5)
Monocytes Relative: 9.6 %
Neutro Abs: 3936 {cells}/uL (ref 1500–7800)
Neutrophils Relative %: 61.5 %
Platelets: 178 Thousand/uL (ref 140–400)
RBC: 3.55 Million/uL — ABNORMAL LOW (ref 3.80–5.10)
RDW: 14.3 % (ref 11.0–15.0)
Total Lymphocyte: 27.8 %
WBC: 6.4 Thousand/uL (ref 3.8–10.8)

## 2023-07-29 LAB — SEDIMENTATION RATE: Sed Rate: 43 mm/h — ABNORMAL HIGH (ref 0–30)

## 2023-07-29 LAB — C3 AND C4
C3 Complement: 142 mg/dL (ref 83–193)
C4 Complement: 33 mg/dL (ref 15–57)

## 2023-07-30 ENCOUNTER — Other Ambulatory Visit: Payer: Self-pay | Admitting: Neurology

## 2023-08-07 MED ORDER — TRIAMCINOLONE ACETONIDE 40 MG/ML IJ SUSP
40.0000 mg | INTRAMUSCULAR | Status: AC | PRN
  Administered 2023-07-28: 40 mg via INTRA_ARTICULAR

## 2023-08-07 MED ORDER — TRIAMCINOLONE ACETONIDE 40 MG/ML IJ SUSP
40.0000 mg | INTRAMUSCULAR | Status: AC | PRN
Start: 2023-07-28 — End: 2023-07-28
  Administered 2023-07-28: 40 mg via INTRA_ARTICULAR

## 2023-08-07 MED ORDER — LIDOCAINE HCL 1 % IJ SOLN
2.0000 mL | INTRAMUSCULAR | Status: AC | PRN
  Administered 2023-07-28: 2 mL

## 2023-08-07 MED ORDER — LIDOCAINE HCL 1 % IJ SOLN
2.0000 mL | INTRAMUSCULAR | Status: AC | PRN
Start: 2023-07-28 — End: 2023-07-28
  Administered 2023-07-28: 2 mL

## 2023-08-08 ENCOUNTER — Other Ambulatory Visit (HOSPITAL_COMMUNITY): Payer: Self-pay | Admitting: Family Medicine

## 2023-08-09 ENCOUNTER — Other Ambulatory Visit: Payer: Self-pay | Admitting: Internal Medicine

## 2023-08-09 DIAGNOSIS — M1A09X0 Idiopathic chronic gout, multiple sites, without tophus (tophi): Secondary | ICD-10-CM

## 2023-08-10 NOTE — Telephone Encounter (Signed)
 Last Fill: 01/05/2023  Labs: 07/07/2023 Creat. 1.10, GFR 52, 04/25/2023 RBC 2.67, Hgb 8.5, Hct 26.2, RDW 15.7  Next Visit: 01/30/2024  Last Visit: 07/28/2023  DX: Chronic gouty arthritis   Current Dose per office note 07/28/2023: Considering discontinuing allopurinol  if uric acid levels are below goal. Uric acid level was not check.   Okay to refill Allopurinol ?

## 2023-08-16 ENCOUNTER — Ambulatory Visit: Payer: Medicare HMO

## 2023-08-16 ENCOUNTER — Other Ambulatory Visit (HOSPITAL_COMMUNITY): Payer: Self-pay | Admitting: Internal Medicine

## 2023-08-16 DIAGNOSIS — K922 Gastrointestinal hemorrhage, unspecified: Secondary | ICD-10-CM

## 2023-08-16 DIAGNOSIS — R1032 Left lower quadrant pain: Secondary | ICD-10-CM

## 2023-08-16 DIAGNOSIS — I429 Cardiomyopathy, unspecified: Secondary | ICD-10-CM | POA: Diagnosis not present

## 2023-08-17 ENCOUNTER — Ambulatory Visit (HOSPITAL_COMMUNITY)
Admission: RE | Admit: 2023-08-17 | Discharge: 2023-08-17 | Disposition: A | Discharge status: Home / Self Care | Source: Ambulatory Visit | Attending: Internal Medicine | Admitting: Internal Medicine

## 2023-08-17 ENCOUNTER — Encounter (HOSPITAL_COMMUNITY): Payer: Self-pay

## 2023-08-17 DIAGNOSIS — K922 Gastrointestinal hemorrhage, unspecified: Secondary | ICD-10-CM | POA: Insufficient documentation

## 2023-08-17 DIAGNOSIS — R1032 Left lower quadrant pain: Secondary | ICD-10-CM | POA: Insufficient documentation

## 2023-08-17 LAB — CUP PACEART REMOTE DEVICE CHECK
Battery Remaining Longevity: 32 mo
Battery Voltage: 2.96 V
Brady Statistic RV Percent Paced: 99.49 %
Date Time Interrogation Session: 20250722022826
HighPow Impedance: 62 Ohm
Implantable Lead Connection Status: 753985
Implantable Lead Implant Date: 20120920
Implantable Lead Location: 753862
Implantable Pulse Generator Implant Date: 20190809
Lead Channel Impedance Value: 247 Ohm
Lead Channel Impedance Value: 304 Ohm
Lead Channel Pacing Threshold Amplitude: 0.625 V
Lead Channel Pacing Threshold Pulse Width: 0.4 ms
Lead Channel Sensing Intrinsic Amplitude: 9.375 mV
Lead Channel Sensing Intrinsic Amplitude: 9.375 mV
Lead Channel Setting Pacing Amplitude: 1.25 V
Lead Channel Setting Pacing Pulse Width: 0.4 ms
Lead Channel Setting Sensing Sensitivity: 0.3 mV
Zone Setting Status: 755011
Zone Setting Status: 755011

## 2023-08-17 MED ORDER — IOHEXOL 300 MG/ML  SOLN
80.0000 mL | Freq: Once | INTRAMUSCULAR | Status: AC | PRN
  Administered 2023-08-17: 80 mL via INTRAVENOUS

## 2023-08-17 MED ORDER — IOHEXOL 9 MG/ML PO SOLN: 1000.0000 mL | ORAL | Status: AC

## 2023-08-18 ENCOUNTER — Ambulatory Visit: Payer: Self-pay | Admitting: Cardiology

## 2023-09-30 ENCOUNTER — Encounter: Payer: Self-pay | Admitting: Pulmonary Disease

## 2023-09-30 ENCOUNTER — Ambulatory Visit: Admitting: Pulmonary Disease

## 2023-09-30 VITALS — BP 148/84 | HR 89 | Temp 98.1°F | Ht 63.0 in | Wt 119.6 lb

## 2023-09-30 DIAGNOSIS — I272 Pulmonary hypertension, unspecified: Secondary | ICD-10-CM

## 2023-09-30 DIAGNOSIS — R053 Chronic cough: Secondary | ICD-10-CM | POA: Diagnosis not present

## 2023-09-30 DIAGNOSIS — R1312 Dysphagia, oropharyngeal phase: Secondary | ICD-10-CM

## 2023-09-30 DIAGNOSIS — J45909 Unspecified asthma, uncomplicated: Secondary | ICD-10-CM

## 2023-09-30 DIAGNOSIS — J309 Allergic rhinitis, unspecified: Secondary | ICD-10-CM

## 2023-09-30 DIAGNOSIS — J84112 Idiopathic pulmonary fibrosis: Secondary | ICD-10-CM

## 2023-09-30 DIAGNOSIS — J849 Interstitial pulmonary disease, unspecified: Secondary | ICD-10-CM

## 2023-09-30 NOTE — Patient Instructions (Signed)
 Nice to see you again  No change in the medication  Recommend using flutter valve as we discussed several times a minute for up to 5 to 10 minutes 2-3 times a day.  See if this aids in removing mucus from the lungs.  I think this is likely related to the scarring of the lung.  We will repeat a CT scan in 6 months and discussed the results at your follow-up  Return to clinic in 6 months or sooner as needed with Dr. Annella, 37-month follow-up after CT scan

## 2023-09-30 NOTE — Progress Notes (Signed)
 @Patient  ID: Lindsey Huber, female    DOB: 09-25-1945, 78 y.o.   MRN: [id number]  Chief Complaint  Patient presents with   Follow-up    IPF f/u, pt reports she's had dull chest pain over the last week, like it's sore Pt states her OFEV  is going well.     Referring provider: [name], [name], PA  HPI:   78 y.o. woman whom I am seeing in follow up and ongoing care of DOE and ILD.  Multiple hospital notes in the interim with GI bleed reviewed.  Dyspnea remains, but at baseline.  Good adherence to Breztri  and Ofev .  Several questions answered.  Chief complaint is productive cough worse at night causing some difficulties when lying flat.  Discussed likely with IPF.  Discussed further eval for airway clearance.  She is not complaining of chest discomfort to me.  We discussed surveillance CT scan in the coming months regarding her ILD.  HPI at initial visit: Patient recently moved to the area from [place] .  Had multiple specialist including cardiology, pulmonary.  Chronic dyspnea for some time.  Diagnosed with RA related ILD.  Started on Ofev  in the past.  Continues this.  Occasional diarrhea.  Dyspnea worse on inclines or stairs.  Present on flat surfaces over short distances as well.  No time of day when things are better or worse.  Dyspnea constant.  No seasonal environmental factors she can identify that make this better or worse.  No position make things better or worse.  No clear alleviating exacerbating factors.  She continues on inhalers.  Currently Trelegy.  PMH: ILD, valvular issues, heart issues, Surgical history: Heart valve surgery, pacemaker placement, tonsillectomy Family history: Mother had CAD, hypertension Father with CAD, hypertension Social history: Never smoker, recently moved to the area, lives in [place] / Pulmonary Flowsheets:   ACT:      No data to display          MMRC:     No data to display          Epworth:      No  data to display          Tests:   FENO:  No results found for: NITRICOXIDE  PFT:     No data to display          WALK:     08/22/2020    2:52 PM  SIX MIN WALK  Medications levothyroxine  25mcg at 6am, allopurinol  100mg , hydroxychloroquine  200mg , prednisone  5mg  at 7am, and multivitamin at 12pm  Supplimental Oxygen during Test? (L/min) No  Laps 6  Partial Lap (in Meters) 0 meters  Baseline BP (sitting) 120/72  Baseline Heartrate 91  Baseline Dyspnea (Borg Scale) 3  Baseline Fatigue (Borg Scale) 2  Baseline SPO2 100 %  BP (sitting) 150/100  Heartrate 95  Dyspnea (Borg Scale) 6  Fatigue (Borg Scale) 4  SPO2 97 %  BP (sitting) 138/90  Heartrate 90  SPO2 100 %  Stopped or Paused before Six Minutes Yes  Other Symptoms at end of Exercise paused with 1 min 50 sec remaining for 2 minutes due to needing to catch her breath  Distance Completed 204 meters  Distance Completed 0 meters  Tech Comments: pt walked at a slow pace with a left knee brace on, stumbled multiple times against the wall due to the left knee and brace. pt paused with 1 min 50 sec remaining for 2 min to catch breath but was able to  fully finish the walk.   Imaging: Personally reviewed and as per EMR in discussion this note  Lab Results: Personally reviewed CBC     Component Value Date/Time   WBC 6.4 07/28/2023 1152   RBC 3.55 (L) 07/28/2023 1152   HGB 11.0 (L) 07/28/2023 1152   HGB 10.4 (L) 12/07/2022 1432   HCT 35.4 07/28/2023 1152   HCT 33.3 (L) 12/07/2022 1432   PLT 178 07/28/2023 1152   PLT 167 12/07/2022 1432   MCV 99.7 07/28/2023 1152   MCV 100 (H) 12/07/2022 1432   MCH 31.0 07/28/2023 1152   MCHC 31.1 (L) 07/28/2023 1152   RDW 14.3 07/28/2023 1152   RDW 15.4 12/07/2022 1432   LYMPHSABS 1.5 04/23/2023 1448   MONOABS 0.6 04/23/2023 1448   EOSABS 51 07/28/2023 1152   BASOSABS 19 07/28/2023 1152    BMET    Component Value Date/Time   NA 136 07/07/2023 1539   NA 144 12/07/2022  1432   K 4.1 07/07/2023 1539   CL 100 07/07/2023 1539   CO2 25 07/07/2023 1539   GLUCOSE 79 07/07/2023 1539   BUN 20 07/07/2023 1539   BUN 25 12/07/2022 1432   CREATININE 1.10 (H) 07/07/2023 1539   CREATININE 1.11 (H) 05/03/2022 1519   CALCIUM  9.1 07/07/2023 1539   GFRNONAA 52 (L) 07/07/2023 1539    BNP    Component Value Date/Time   BNP 511.4 (H) 06/10/2023 0929    ProBNP No results found for: PROBNP  Specialty Problems       Pulmonary Problems   Idiopathic pulmonary fibrosis   Allergic rhinitis   Epistaxis   Oropharyngeal dysphagia   Asthma, chronic    Allergies  Allergen Reactions   Diprivan  [Propofol ] Shortness Of Breath and Other (See Comments)    Caused asthma   Other Other (See Comments)    Patient is to NOT EAT any foods with husks or tree nuts, popcorn   Tambocor [Flecainide] Other (See Comments)    Unknown reaction   Norvasc [Amlodipine] Other (See Comments)    Fatigue Syncope   Pacerone [Amiodarone] Other (See Comments)    Delirium Confusion Psychosis    Immunization History  Administered Date(s) Administered   Fluad Trivalent(High Dose 65+) 11/03/2022   Influenza-Unspecified 10/26/2019, 10/07/2021   PFIZER(Purple Top)SARS-COV-2 Vaccination 04/17/2019, 05/08/2019, 11/24/2019    Past Medical History:  Diagnosis Date   ABLA (acute blood loss anemia) 07/04/2022   Achalasia    Acquired hypothyroidism 08/20/2020   Acute lower GI bleeding 11/02/2022   Acute metabolic encephalopathy 07/25/2021   AICD (automatic cardioverter/defibrillator) present 11/14/2019   Allergic rhinitis    Ankle fracture 03/25/2022   Aortic valve disorder 03/26/2002   Atherosclerosis of abdominal aorta (HCC) 05/01/2020   Cardiomyopathy (HCC)    CHB (complete heart block) (HCC) 08/18/2017   CHF (congestive heart failure) (HCC)    Cholelithiasis without obstruction 05/01/2020   Chronic gouty arthritis    Chronic heart failure with preserved ejection fraction (HCC)  05/01/2020   Chronic kidney disease (CKD), active medical management without dialysis, stage 3 (moderate) (HCC) 05/01/2020   Closed torus fracture of distal end of right radius with delayed healing 08/12/2021   Delusional thoughts (HCC)    Diverticulosis of colon 05/01/2020   Epistaxis    Essential (primary) hypertension 08/18/2017   Gastroesophageal reflux disease 05/01/2020   Gastrointestinal hemorrhage    Hemorrhagic shock (HCC) 07/02/2022   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 11/14/2019   Hypercoagulability due  to atrial fibrillation (HCC) 05/01/2020   Hyperlipidemia 11/14/2019   Hypocalcemia 12/14/2020   Hypoglycemia    Hypokalemia 12/14/2020   Hypomagnesemia 12/14/2020   Hypotension 12/14/2020   Idiopathic pulmonary fibrosis (HCC) 05/01/2020   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   Iron deficiency anemia    Long term (current) use of anticoagulants    Malnutrition of mild degree [name]: 75% to less than 90% of standard weight) (HCC)    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/12/2021   Mitral and aortic incompetence 11/14/2019   Non-rheumatic atrial fibrillation (HCC) 05/01/2020   Non-toxic multinodular goiter 08/20/2020   NSVT (nonsustained ventricular tachycardia) (HCC) 08/18/2017   Oropharyngeal dysphagia    Osteoarthritis of hip 05/01/2020   Osteoarthritis of knee 05/01/2020   Osteopenia of neck of left femur    Pain of left hip joint 12/12/2017   Proteinuria 05/01/2020   Raynaud's disease    Recurrent falls 04/09/2021   Rheumatoid arthritis (HCC)    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   Seizures (HCC)    Septic shock (HCC) 03/10/2020   Slow transit constipation    Systemic lupus erythematosus (HCC) 05/01/2020   Thrombophilia (HCC)    Transient ischemic attack    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   Vitamin D  deficiency     Tobacco History: Social History   Tobacco Use  Smoking Status  Never   Passive exposure: Past  Smokeless Tobacco Never   Counseling given: Not Answered   Continue to not smoke  Outpatient Encounter Medications as of 09/30/2023  Medication Sig   acetaminophen  (TYLENOL ) 500 MG tablet Take 1,000 mg by mouth every 6 (six) hours as needed (pain).   albuterol  (VENTOLIN  HFA) 108 (90 Base) MCG/ACT inhaler Inhale 2 puffs into the lungs every 6 (six) hours as needed for wheezing or shortness of breath.   allopurinol  (ZYLOPRIM ) 100 MG tablet TAKE 1 TABLET BY MOUTH EVERY DAY   amoxicillin (AMOXIL) 500 MG capsule 4 capsules by mouth 1 hour before dental procedure.   apixaban  (ELIQUIS ) 2.5 MG TABS tablet Take 1 tablet (2.5 mg total) by mouth 2 (two) times daily.   Budeson-Glycopyrrol-Formoterol  (BREZTRI  AEROSPHERE) 160-9-4.8 MCG/ACT AERO Inhale 2 puffs into the lungs 2 (two) times daily as needed (for flares).   calcium -vitamin D  (OSCAL WITH D) 500-5 MG-MCG tablet Take 2 tablets by mouth 2 (two) times daily.   cetirizine  HCl (ZYRTEC ) 5 MG/5ML SOLN Take 1 mL (1 mg total) by mouth 2 (two) times daily as needed for rhinitis or allergies.   Cholecalciferol  (VITAMIN D3) 25 MCG (1000 UT) CAPS Take 1,000 Units by mouth daily with lunch.   fluticasone  (FLONASE ) 50 MCG/ACT nasal spray Place 1 spray into both nostrils as needed for allergies or rhinitis.   furosemide  (LASIX ) 40 MG tablet TAKE 1 TABLET BY MOUTH EVERY DAY   hydroxychloroquine  (PLAQUENIL ) 200 MG tablet Take 1 tablet (200 mg total) by mouth daily.   levETIRAcetam  (KEPPRA ) 500 MG tablet Take 1 tablet (500 mg total) by mouth 2 (two) times daily.   magnesium  oxide (MAG-OX) 400 (240 Mg) MG tablet TAKE 1 TABLET BY MOUTH EVERY DAY   Nintedanib  (OFEV ) 150 MG CAPS Take 1 capsule (150 mg total) by mouth at bedtime.   OLANZapine  (ZYPREXA ) 2.5 MG tablet TAKE 1 TABLET BY MOUTH EVERYDAY AT BEDTIME   pantoprazole  (PROTONIX ) 40 MG tablet Take 1 tablet (40 mg total) by mouth 2 (two) times daily.   polyethylene glycol powder  (MIRALAX ) 17 GM/SCOOP powder Take 17 g by mouth  daily as needed for mild constipation.   potassium chloride  (KLOR-CON  M) 10 MEQ tablet Take 2 tablets (20 mEq total) by mouth daily. Further refill requests for this medication need to be approved by PCP.   predniSONE  (DELTASONE ) 5 MG tablet Take 1 tablet (5 mg total) by mouth daily with breakfast. Patient to double up dose during sick day rule   potassium chloride  (KLOR-CON ) 10 MEQ tablet 2 tablets Orally Once a day; Duration: 30 days   No facility-administered encounter medications on file as of 09/30/2023.     Review of Systems  Review of Systems  N/a Physical Exam  BP (!) 148/88   Pulse 89   Temp 98.1 F (36.7 C)   Ht 5' 3 (1.6 m)   Wt 119 lb 9.6 oz (54.3 kg)   SpO2 96% Comment: RA  BMI 21.19 kg/m   Wt Readings from Last 5 Encounters:  09/30/23 119 lb 9.6 oz (54.3 kg)  07/28/23 119 lb (54 kg)  06/10/23 116 lb 6.4 oz (52.8 kg)  06/10/23 115 lb 3.2 oz (52.3 kg)  06/07/23 118 lb (53.5 kg)    BMI Readings from Last 5 Encounters:  09/30/23 21.19 kg/m  07/28/23 21.08 kg/m  06/10/23 20.62 kg/m  06/10/23 20.41 kg/m  06/07/23 20.90 kg/m     Physical Exam General: Frail, sitting in chair Eyes: EOMI, no icterus Neck: Supple, no JVP Pulmonary: Crackles in bilateral bases, otherwise clear to auscultation bilaterally Abdomen: Nondistended, bowel sounds present MSK: No synovitis, joint effusion Psych: Normal mood, full affect   Assessment & Plan:   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  To continue this.  High-resolution CT scan 12/24 unchanged.  LFTs reviewed given need for intensive drug monitoring for Ofev .  Repeat CT scan ordered for monitoring prior to next visit.  Dyspnea exertion: Related to ILD, pulmonary hypertension, asthma.  On therapy for ILD as above.  Mild improvement with escalation of inhaler therapy to Breztri .  Asthma: Increase DOE, wheeze, atopic symptoms summer 2022.   Transition Trelegy to Breztri  with improved symptoms.  Eosinophils not elevated.  Consider checking IgE and consider escalation to Biologics in the future.  She has not had much wheezing, lungs are clear, do not suspect asthma as primary driver of her dyspnea, most likely ILD.  Pulmonary hypertension:  Never on therapy.  Right heart cath 11/2020 with mild pulmonary hypertension a PVR of 2, normal wedge.  Reportedly significant valvular disease, like combination  of group 2 and group 3.  She is poor candidate for therapy based on her age, frailty, etiology of disease.   Chronic cough: Suspect related to IPF.  Particularly with mucus cannulation in the evenings causing increased cough.  Recommended using flutter valve 2-3 times a day as it is aids in mucus removal.   Return in about 6 months (around 03/29/2024) for f/u Dr. Annella.   Donnice JONELLE Annella, MD 09/30/2023

## 2023-10-03 ENCOUNTER — Telehealth: Payer: Self-pay

## 2023-10-03 ENCOUNTER — Other Ambulatory Visit (HOSPITAL_COMMUNITY): Payer: Self-pay

## 2023-10-03 DIAGNOSIS — J849 Interstitial pulmonary disease, unspecified: Secondary | ICD-10-CM

## 2023-10-03 NOTE — Telephone Encounter (Signed)
 Received notification from [facility] regarding a prior authorization for OFEV . Authorization has been APPROVED from 01/26/23 to 01/25/24. Approval letter sent to scan center.  Per test claim, copay for 30 or 90 days supply is $90.  Authorization # [id number] Phone # [phone]  Spoke with the patient's daughter Lindsey Huber and she stated they had been receiving Ofev  for no charge under the grant. I advised we are unsure if the copay would remain at $90 subsequently or if it would be a one time charge that got her to her out of pocket max. Per med management encounter on 11/11 we had advised it may be beneficial to enroll in [facility] but they decided not to go this route because Ofev  is the most expensive medication and they would prefer to just receive it no charge instead of spreading the copay out. I advised we will reapply for the grant and keep them updated. Lindsey Huber verbalized her understanding.

## 2023-10-03 NOTE — Telephone Encounter (Signed)
 Received new start paperwork for Ofev , however chart notes state that the patient has been on this medication. Submitted a Prior Authorization request to [facility] for OFEV  via CoverMyMeds. Will update once we receive a response.  Key: [id number]

## 2023-10-03 NOTE — Telephone Encounter (Signed)
 Patient enrolled into IPF grant through PAF:   Amount: $4000 Award Period: 04/06/2023 - 10/02/2024 ID: [id number] BIN: [id number] PCN: [id number] Group: [id number] For pharmacy inquiries, contact PDMI at [phone]. For patient inquiries, contact PAF at [phone]

## 2023-10-07 NOTE — Progress Notes (Signed)
 ADVANCED HF CLINIC NOTE Primary Care: [name], [name], PA EP Cardiologist: Dr. Cindie HF Cardiologist: Dr Vanda  HPI: Ms. Bhagat is a 78 y.o. female with chronic diastolic CHF, permanent AF, AFL s/p ablation, SLE, pulmonary fibrosis, pulmonary hypertension. paroxysmal VT, OSA not on cpap and valvular disease s/p AVR/MV ring/TVR. Referred by Dr. Cindie in 10/22 for further evaluation of PAH and valvular heart disease.  Underwent  AVR/MV ring/TVR 09/25/2010.  Paroxysmal VT, ICD placed due to torsades on Flecainide with SSS requiring PPM which predated valve repar/replacement surgery 09/25/2010 and in fact ICD lead was damaged during valve replacement surgery.       History of chronic COPD and pulmonary fibrosis followed by pulmonology.  On triple therapy for COPD vs asthma, nintedanib  for IPF. Followed by Dr Annella, Did not smoke but inhaled second hand smoke from husband and mother pretty much her whole life until 01/2020.   Echo 11/20: EF 63%,normal bioprosthetic tricuspid and aortic valve with mild tricuspid regurgitation, mitral valve repair with mild mitral regurgitation, severe biatrial enlargement.   Admitted in [place], [place] for hypotension and acute kidney injury 03/10/2020.  She was placed on broad-spectrum antibiotics with initial diagnosis of sepsis, no source of infection found also required Levophed  and IVF There was question of whether adrenal insufficiency contributing.  Steroid therapy reinstituted.   Echo 03/11/2020  LVEF 55 to 60%, moderate to severe tricuspid regurgitation, moderately elevated right ventricular systolic pressure estimate 51mm Hg, moderate mitral regurgitation.  RV not well visualized.    Seen in HF Clinic for first visit 10/27/20. Had NYHA IIIB symptoms. Referred for R/L cath--> normal coronaries, EF 60-65%, and normal left sided filling pressures.   R/LHC 11/27/20: Ao =129/63 (89) LV = 141/8 RA = 11 (v waves to 14) RV = 43/4 PA = 42/15 (25) PCW = 10  Fick cardiac output/index = 5.7/3.7 PVR = 2.1 WU SVR = 1101 PAPi = 2.45 WU. Normal coronary arteries, EF 60-65%, very mild PAH. Normal left-sided filling pressures with no significant v-waves in PCWP tracing to suggest hemodynamically significant MR. No significant stenosis across AoV, MV or TV  Admitted 11/22 with acute LGIB. CT scan showed acute GI hemorrhage involving mid descending colon, likely acute diverticular bleed. Transfused 3u RBCs. Colonoscopy 11/26 which noted hemorrhoids and diverticulosis, no active bleeding, recommended to restart Xarelto  in 3 days if stable.   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  Admitted 3/25 with GIB 2/2 diverticular bleeding. AC held then restarted. Re-admitted later in the month with GI bleeding, AC held agin. Underwent colonoscopy showing hemorrhoids/diverticulosis-no active bleeding.  AC restarted.  Today she returns for HF follow up with her daughter. Overall feeling fine. She has mild SOB with ADLs and walking on flat ground with her rolling walker. Occasional leg swelling. Chest feels heavy x 1 month. Denies palpitations, abnormal bleeding, CP, dizziness, or PND/Orthopnea. Appetite ok. Weight at home 119 pounds. Taking all medications. BP at home 130-140s. Had a recent mechanical fall while playing with grandchild, no syncope.  Past Medical History:  Diagnosis Date   ABLA (acute blood loss anemia) 07/04/2022   Achalasia    Acquired hypothyroidism 08/20/2020   Acute lower GI bleeding 11/02/2022   Acute metabolic encephalopathy 07/25/2021   AICD (automatic cardioverter/defibrillator) present 11/14/2019   Allergic rhinitis    Ankle fracture 03/25/2022   Aortic valve disorder 03/26/2002   Atherosclerosis  of abdominal aorta (HCC) 05/01/2020   Cardiomyopathy (HCC)    CHB (complete heart block) (HCC) 08/18/2017   CHF (congestive heart failure) (HCC)     Cholelithiasis without obstruction 05/01/2020   Chronic gouty arthritis    Chronic heart failure with preserved ejection fraction (HCC) 05/01/2020   Chronic kidney disease (CKD), active medical management without dialysis, stage 3 (moderate) (HCC) 05/01/2020   Closed torus fracture of distal end of right radius with delayed healing 08/12/2021   Delusional thoughts (HCC)    Diverticulosis of colon 05/01/2020   Epistaxis    Essential (primary) hypertension 08/18/2017   Gastroesophageal reflux disease 05/01/2020   Gastrointestinal hemorrhage    Hemorrhagic shock (HCC) 07/02/2022   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 11/14/2019   Hypercoagulability due to atrial fibrillation (HCC) 05/01/2020   Hyperlipidemia 11/14/2019   Hypocalcemia 12/14/2020   Hypoglycemia    Hypokalemia 12/14/2020   Hypomagnesemia 12/14/2020   Hypotension 12/14/2020   Idiopathic pulmonary fibrosis (HCC) 05/01/2020   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   Iron deficiency anemia    Long term (current) use of anticoagulants    Malnutrition of mild degree [name]: 75% to less than 90% of standard weight) (HCC)    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/12/2021   Mitral and aortic incompetence 11/14/2019   Non-rheumatic atrial fibrillation (HCC) 05/01/2020   Non-toxic multinodular goiter 08/20/2020   NSVT (nonsustained ventricular tachycardia) (HCC) 08/18/2017   Oropharyngeal dysphagia    Osteoarthritis of hip 05/01/2020   Osteoarthritis of knee 05/01/2020   Osteopenia of neck of left femur    Pain of left hip joint 12/12/2017   Proteinuria 05/01/2020   Raynaud's disease    Recurrent falls 04/09/2021   Rheumatoid arthritis (HCC)    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     albuterol  (VENTOLIN  HFA) 108 (90 Base) MCG/ACT inhaler Inhale 2 puffs into the lungs every 6 (six) hours as needed for wheezing or shortness of breath. 2 each 6   allopurinol  (ZYLOPRIM ) 100 MG tablet TAKE 1 TABLET BY MOUTH EVERY DAY 90 tablet 3   amoxicillin (AMOXIL) 500 MG capsule 4 capsules by mouth 1 hour before dental procedure.     apixaban  (ELIQUIS ) 2.5 MG TABS tablet Take 1 tablet (2.5 mg total) by mouth 2 (two) times daily.     Budeson-Glycopyrrol-Formoterol  (BREZTRI  AEROSPHERE) 160-9-4.8 MCG/ACT AERO Inhale 2 puffs into the lungs 2 (two) times daily as needed (for flares). 1 each 11   calcium -vitamin D  (OSCAL WITH D) 500-5 MG-MCG tablet Take 2 tablets by mouth 2 (two) times daily. 120 tablet 0   cetirizine  HCl (ZYRTEC ) 5 MG/5ML SOLN Take 1 mL (1 mg total) by mouth 2 (two) times daily as needed for rhinitis or allergies.     Cholecalciferol  (VITAMIN D3) 25 MCG (1000 UT) CAPS Take 1,000 Units by mouth daily with lunch.     fluticasone  (FLONASE ) 50 MCG/ACT nasal spray Place 1 spray into both nostrils as needed for allergies or rhinitis.     furosemide  (LASIX ) 40 MG  tablet TAKE 1 TABLET BY MOUTH EVERY DAY 90 tablet 7   hydroxychloroquine  (PLAQUENIL ) 200 MG tablet Take 1 tablet (200 mg total) by mouth daily. 90 tablet 1   levETIRAcetam  (KEPPRA ) 500 MG tablet Take 1 tablet (500 mg total) by mouth 2 (two) times daily. 60 tablet 5   magnesium  oxide (MAG-OX) 400 (240 Mg) MG tablet TAKE 1 TABLET BY MOUTH EVERY DAY 90 tablet 3   Nintedanib  (OFEV ) 150 MG CAPS Take 1 capsule (150 mg total) by mouth at bedtime. 60 capsule 11   OLANZapine  (ZYPREXA ) 2.5 MG tablet TAKE 1 TABLET BY MOUTH EVERYDAY AT BEDTIME 90 tablet 1   pantoprazole  (PROTONIX ) 40 MG tablet Take 1  tablet (40 mg total) by mouth 2 (two) times daily. 60 tablet 1   polyethylene glycol powder (MIRALAX ) 17 GM/SCOOP powder Take 17 g by mouth daily as needed for mild constipation.     potassium chloride  (KLOR-CON  M) 10 MEQ tablet Take 2 tablets (20 mEq total) by mouth daily. Further refill requests for this medication need to be approved by PCP. 60 tablet 0   predniSONE  (DELTASONE ) 5 MG tablet Take 1 tablet (5 mg total) by mouth daily with breakfast. Patient to double up dose during sick day rule 100 tablet 3   No current facility-administered medications for this encounter.   Allergies  Allergen Reactions   Diprivan  [Propofol ] Shortness Of Breath and Other (See Comments)    Caused asthma   Other Other (See Comments)    Patient is to NOT EAT any foods with husks or tree nuts, popcorn   Tambocor [Flecainide] Other (See Comments)    Unknown reaction   Norvasc [Amlodipine] Other (See Comments)    Fatigue Syncope   Pacerone [Amiodarone] Other (See Comments)    Delirium Confusion Psychosis   Social History   Socioeconomic History   Marital status: Legally Separated    Spouse name: Not on file   Number of children: Not on file   Years of education: 16   Highest education level: Bachelor's degree (e.g., BA, AB, BS)  Occupational History   Occupation: Retired    Comment: Community education officer business  Tobacco Use   Smoking status: Never    Passive exposure: Past   Smokeless tobacco: Never  Vaping Use   Vaping status: Never Used  Substance and Sexual Activity   Alcohol  use: Never   Drug use: Never   Sexual activity: Not Currently  Other Topics Concern   Not on file  Social History Narrative   Right Handed    Social Drivers of Health   Financial Resource Strain: Not on file  Food Insecurity: No Food Insecurity (04/23/2023)   Hunger Vital Sign    Worried About Running Out of Food in the Last Year: Never true    Ran Out of Food in the Last Year: Never true  Transportation Needs: No  Transportation Needs (04/23/2023)   PRAPARE - Administrator, Civil Service (Medical): No    Lack of Transportation (Non-Medical): No  Physical Activity: Not on file  Stress: Not on file  Social Connections: Moderately Isolated (04/23/2023)   Social Connection and Isolation Panel    Frequency of Communication with Friends and Family: More than three times a week    Frequency of Social Gatherings with Friends and Family: Three times a week    Attends Religious Services: More than 4 times per year    Active Member of Clubs or Organizations: No    Attends Club  or Organization Meetings: Never    Marital Status: Separated  Intimate Partner Violence: Not At Risk (04/23/2023)   Humiliation, Afraid, Rape, and Kick questionnaire    Fear of Current or Ex-Partner: No    Emotionally Abused: No    Physically Abused: No    Sexually Abused: No   Family History  Problem Relation Age of Onset   Heart disease Mother    Hypertension Mother    Rheum arthritis Mother    Osteoarthritis Mother    Heart disease Father    Hypertension Father    Osteoarthritis Father    Heart disease Sister    Diabetes Brother    Cancer Brother    BP (!) 162/80   Pulse 99   Wt 53.8 kg (118 lb 9.6 oz)   SpO2 98%   BMI 21.01 kg/m   Wt Readings from Last 3 Encounters:  10/12/23 53.8 kg (118 lb 9.6 oz)  09/30/23 54.3 kg (119 lb 9.6 oz)  07/28/23 54 kg (119 lb)   PHYSICAL EXAM: General:  NAD. No resp difficulty, walked into clinic with RW, frail HEENT: Normal Neck: Supple. No JVD. + CV waves Cor: Regular rate & rhythm. No rubs, gallops, 2/6 HSM Lungs: Clear, diminished in bases Abdomen: Soft, nontender, nondistended.  Extremities: No cyanosis, clubbing, rash, 2+ BLE pre-tibial edema Neuro: Alert & oriented x 3, moves all 4 extremities w/o difficulty. Affect pleasant.   ICD interrogation (personally reviewed): OptiVol up but thoracic impedence ok, 0.1 hr/day activity, VP 99.5%, no VT  ECG  (personally reviewed): V paced 71 bpm  ASSESSMENT & PLAN: Chronic Diastolic CHF due to valvular heart disease - s/p bioprosthetic AVR/MV ring/bioprosthetic TVR 09/25/2010 - Echo 03/11/2020  LVEF 55 to 60%, moderate to severe tricuspid regurgitation, moderately elevated right ventricular systolic pressure estimate 51mm Hg, moderate mitral regurgitation.  RV not well visualized. AV function not assessed. - RHC 11/22: Mild PAH. 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 Volume up on exam and OptiVol - Place compression hose - Increase Lasix  to 80 mg daily, increase KCL to 40 daily. - Off Toprol  with low BP, consider adding back when better diuresed - Echo showed potential progressive prosthetic valve stenosis. Ideally would proceed with TEE, but given her overall debility currently, worry the risk >benefit and that she would likely not be good candidate for further valvular procedures. - We discussed this again today and she verbalizes not wanting any invasive procedures. - Update echo next visit - Labs today, repeat BMET in 10-14 days - Will ask device RN to send interrogation in 1 week  2. Pulmonary HTN by ECHO - RHC 11/22 with mild PAH and normal PVR. (See above) - Likely due to valvular disease primarily - Follow on echo  3. ILD - 11/2018 HRCT with stable ILD compared with 2018.  2018 CT Slowly progressive basal predominant, moderately severe pulmonary fibrosis. Favor NSIP - Followed by pulmonary   - No change.  4. OSA - Previously on CPAP, but resolved with weight loss - No change  5.  Permanent Afib with SSS - has ICD in place due to h/o VT  - Followed by Dr. Cindie - On Eliquis  2.5 mg bid, reduced dose, presumably for GI bleeding risk. No bleeding issues - May need to consider discontinuation of AC with frequent falls and GIB - CBC today.  6. H/o VT - s/p ICD  - No recent VT per interrogation as above.  7. GIB - Colonoscopy 2022: Left diverticulosis - Colonoscopy 3/25: no active bleeding, suspect bleeding from colonic diverticula - 2 admission in 2025 for bleeds - Followed by GI  8. SLE - Followed by Dr. Jeannetta, lupus felt to be under good control - Continued on HCQ and she is also on 5 mg prednisone  for secondary adrenal insufficiency  9. HTN - BP up today, generally well-controlled at home on current meds - Consider addition of losartan if BP remains elevated.  Follow up in 2-3 months with Dr. Cherrie + echo  Harlene CHRISTELLA Gainer, FNP  4:05 PM  10/12/23   [name] [address] [facility] [place] [address] [phone] (office) [phone] (fax)

## 2023-10-11 ENCOUNTER — Telehealth (HOSPITAL_COMMUNITY): Payer: Self-pay

## 2023-10-11 NOTE — Telephone Encounter (Signed)
 Called to confirm/remind patient of their appointment at the [facility] Clinic on 10/12/23  Appointment:   [] Confirmed  [x] Left mess   [] No answer/No voice mail  [] VM Full/unable to leave message  [] Phone not in service  And to bring in all medications and/or complete list.

## 2023-10-12 ENCOUNTER — Encounter (HOSPITAL_COMMUNITY): Payer: Self-pay

## 2023-10-12 ENCOUNTER — Ambulatory Visit (HOSPITAL_COMMUNITY)
Admission: RE | Admit: 2023-10-12 | Discharge: 2023-10-12 | Disposition: A | Discharge status: Home / Self Care | Source: Ambulatory Visit | Attending: Family Medicine | Admitting: Family Medicine

## 2023-10-12 ENCOUNTER — Ambulatory Visit (HOSPITAL_COMMUNITY): Payer: Self-pay | Admitting: Family Medicine

## 2023-10-12 VITALS — BP 162/80 | HR 99 | Wt 118.6 lb

## 2023-10-12 DIAGNOSIS — I5032 Chronic diastolic (congestive) heart failure: Secondary | ICD-10-CM

## 2023-10-12 DIAGNOSIS — Z952 Presence of prosthetic heart valve: Secondary | ICD-10-CM | POA: Diagnosis not present

## 2023-10-12 DIAGNOSIS — J449 Chronic obstructive pulmonary disease, unspecified: Secondary | ICD-10-CM | POA: Diagnosis not present

## 2023-10-12 DIAGNOSIS — Z9181 History of falling: Secondary | ICD-10-CM | POA: Diagnosis not present

## 2023-10-12 DIAGNOSIS — Z7952 Long term (current) use of systemic steroids: Secondary | ICD-10-CM | POA: Diagnosis not present

## 2023-10-12 DIAGNOSIS — I2721 Secondary pulmonary arterial hypertension: Secondary | ICD-10-CM | POA: Diagnosis not present

## 2023-10-12 DIAGNOSIS — Z8719 Personal history of other diseases of the digestive system: Secondary | ICD-10-CM | POA: Insufficient documentation

## 2023-10-12 DIAGNOSIS — Z7722 Contact with and (suspected) exposure to environmental tobacco smoke (acute) (chronic): Secondary | ICD-10-CM | POA: Diagnosis not present

## 2023-10-12 DIAGNOSIS — R54 Age-related physical debility: Secondary | ICD-10-CM | POA: Insufficient documentation

## 2023-10-12 DIAGNOSIS — Z95810 Presence of automatic (implantable) cardiac defibrillator: Secondary | ICD-10-CM | POA: Insufficient documentation

## 2023-10-12 DIAGNOSIS — Z953 Presence of xenogenic heart valve: Secondary | ICD-10-CM | POA: Diagnosis not present

## 2023-10-12 DIAGNOSIS — I4821 Permanent atrial fibrillation: Secondary | ICD-10-CM | POA: Diagnosis not present

## 2023-10-12 DIAGNOSIS — I130 Hypertensive heart and chronic kidney disease with heart failure and stage 1 through stage 4 chronic kidney disease, or unspecified chronic kidney disease: Secondary | ICD-10-CM | POA: Insufficient documentation

## 2023-10-12 DIAGNOSIS — I272 Pulmonary hypertension, unspecified: Secondary | ICD-10-CM | POA: Insufficient documentation

## 2023-10-12 DIAGNOSIS — T82857A Stenosis of cardiac prosthetic devices, implants and grafts, initial encounter: Secondary | ICD-10-CM | POA: Insufficient documentation

## 2023-10-12 DIAGNOSIS — I083 Combined rheumatic disorders of mitral, aortic and tricuspid valves: Secondary | ICD-10-CM | POA: Diagnosis not present

## 2023-10-12 DIAGNOSIS — I495 Sick sinus syndrome: Secondary | ICD-10-CM | POA: Diagnosis not present

## 2023-10-12 DIAGNOSIS — I10 Essential (primary) hypertension: Secondary | ICD-10-CM

## 2023-10-12 DIAGNOSIS — Z7901 Long term (current) use of anticoagulants: Secondary | ICD-10-CM | POA: Diagnosis not present

## 2023-10-12 DIAGNOSIS — Y712 Prosthetic and other implants, materials and accessory cardiovascular devices associated with adverse incidents: Secondary | ICD-10-CM | POA: Diagnosis not present

## 2023-10-12 DIAGNOSIS — J84112 Idiopathic pulmonary fibrosis: Secondary | ICD-10-CM | POA: Diagnosis not present

## 2023-10-12 DIAGNOSIS — E2749 Other adrenocortical insufficiency: Secondary | ICD-10-CM | POA: Diagnosis not present

## 2023-10-12 DIAGNOSIS — I482 Chronic atrial fibrillation, unspecified: Secondary | ICD-10-CM | POA: Diagnosis not present

## 2023-10-12 DIAGNOSIS — G4733 Obstructive sleep apnea (adult) (pediatric): Secondary | ICD-10-CM | POA: Diagnosis not present

## 2023-10-12 DIAGNOSIS — N183 Chronic kidney disease, stage 3 unspecified: Secondary | ICD-10-CM | POA: Insufficient documentation

## 2023-10-12 DIAGNOSIS — I472 Ventricular tachycardia, unspecified: Secondary | ICD-10-CM

## 2023-10-12 DIAGNOSIS — Z79899 Other long term (current) drug therapy: Secondary | ICD-10-CM | POA: Diagnosis not present

## 2023-10-12 DIAGNOSIS — M329 Systemic lupus erythematosus, unspecified: Secondary | ICD-10-CM | POA: Insufficient documentation

## 2023-10-12 DIAGNOSIS — J849 Interstitial pulmonary disease, unspecified: Secondary | ICD-10-CM

## 2023-10-12 DIAGNOSIS — K922 Gastrointestinal hemorrhage, unspecified: Secondary | ICD-10-CM

## 2023-10-12 LAB — BASIC METABOLIC PANEL WITH GFR
Anion gap: 14 (ref 5–15)
BUN: 24 mg/dL — ABNORMAL HIGH (ref 8–23)
CO2: 26 mmol/L (ref 22–32)
Calcium: 9.4 mg/dL (ref 8.9–10.3)
Chloride: 101 mmol/L (ref 98–111)
Creatinine, Ser: 1.27 mg/dL — ABNORMAL HIGH (ref 0.44–1.00)
GFR, Estimated: 44 mL/min — ABNORMAL LOW (ref >60)
Glucose, Bld: 93 mg/dL (ref 70–99)
Potassium: 3.8 mmol/L (ref 3.5–5.1)
Sodium: 141 mmol/L (ref 135–145)

## 2023-10-12 LAB — CBC
HCT: 31.7 % — ABNORMAL LOW (ref 36.0–46.0)
Hemoglobin: 10.1 g/dL — ABNORMAL LOW (ref 12.0–15.0)
MCH: 30.7 pg (ref 26.0–34.0)
MCHC: 31.9 g/dL (ref 30.0–36.0)
MCV: 96.4 fL (ref 80.0–100.0)
Platelets: 159 K/uL (ref 150–400)
RBC: 3.29 MIL/uL — ABNORMAL LOW (ref 3.87–5.11)
RDW: 15.5 % (ref 11.5–15.5)
WBC: 7.2 K/uL (ref 4.0–10.5)
nRBC: 0 % (ref 0.0–0.2)

## 2023-10-12 LAB — BRAIN NATRIURETIC PEPTIDE: B Natriuretic Peptide: 330.7 pg/mL — ABNORMAL HIGH (ref 0.0–100.0)

## 2023-10-12 MED ORDER — POTASSIUM CHLORIDE ER 10 MEQ PO TBCR: 40.0000 meq | EXTENDED_RELEASE_TABLET | Freq: Every day | ORAL | 11 refills | Status: DC

## 2023-10-12 MED ORDER — FUROSEMIDE 40 MG PO TABS: 80.0000 mg | ORAL_TABLET | Freq: Every day | ORAL | 3 refills | Status: DC

## 2023-10-12 NOTE — Patient Instructions (Addendum)
 Thank you for coming in today  EKG was done today   You have been given a prescription for compression hose. And a list of places who supply them. Please wear your compression hose daily, place them on as soon as you get up in the morning and remove before you go to bed at night.   If you had labs drawn today, any labs that are abnormal the clinic will call you No news is good news  Medications: Increase Lasix  to 80 mg daily Increase Potassium to 40 meq daily  Follow up appointments: Your physician recommends that you return for lab work in: BMET 10-14 days  Your physician recommends that you schedule a follow-up appointment in:  2-3 months With Dr. Cherrie with echocardiogram  Your physician has requested that you have an echocardiogram. Echocardiography is a painless test that uses sound waves to create images of your heart. It provides your doctor with information about the size and shape of your heart and how well your heart's chambers and valves are working. This procedure takes approximately one hour. There are no restrictions for this procedure.      Do the following things EVERYDAY: Weigh yourself in the morning before breakfast. Write it down and keep it in a log. Take your medicines as prescribed Eat low salt foods--Limit salt (sodium) to 2000 mg per day.  Stay as active as you can everyday Limit all fluids for the day to less than 2 liters   At the [facility], you and your health needs are our priority. As part of our continuing mission to provide you with exceptional heart care, we have created designated Provider Care Teams. These Care Teams include your primary Cardiologist (physician) and Advanced Practice Providers (APPs- Physician Assistants and Nurse Practitioners) who all work together to provide you with the care you need, when you need it.   You may see any of the following providers on your designated Care Team at your next follow up: Dr  Toribio Cherrie Dr Ezra Shuck Dr. Ria Gardenia Greig Lenetta, NP Caffie Shed, [name] [facility] [place], [name] Beckey Coe, NP Tinnie Redman, PharmD   Please be sure to bring in all your medications bottles to every appointment.    Thank you for choosing [facility]  If you have any questions or concerns before your next appointment please send us  a message through [place] or call our office at [phone].    TO LEAVE A MESSAGE FOR THE NURSE SELECT OPTION 2, PLEASE LEAVE A MESSAGE INCLUDING: YOUR NAME DATE OF BIRTH CALL BACK NUMBER REASON FOR CALL**this is important as we prioritize the call backs  YOU WILL RECEIVE A CALL BACK THE SAME DAY AS LONG AS YOU CALL BEFORE 4:00 PM

## 2023-10-14 NOTE — Telephone Encounter (Signed)
 ATC patient to offer counseling on medication in case she has further questions. LVM with contact number [phone] if she would like to speak with pharmacist. Per chart notes, she has been taking Ofev , but offering counseling in case it is needed.   Aleck Puls, PharmD, BCPS Clinical Pharmacist  [facility]

## 2023-10-18 ENCOUNTER — Telehealth: Payer: Self-pay

## 2023-10-18 ENCOUNTER — Encounter

## 2023-10-18 NOTE — Telephone Encounter (Signed)
 Spoke with daughter.  She called [employer] and advised the handpiece needs replacing due to the battery is no longer charging.  Should receive new hand held piece in 7-10 days.  Has ICM phone number and requested to call back once the new monitor piece is received.    Advised will inform Harlene Gainer, NP at HF clinic that unable to obtain remote transmission this week.   If monitor is received next week, daughter will call to get assistance with transmitting report.

## 2023-10-18 NOTE — Telephone Encounter (Signed)
 Spoke with daughter and patient and explained the reason for call.  Attempted to send remote transmission but unsuccessful.  Provided [employer] tech support number to call for assistance.

## 2023-10-18 NOTE — Telephone Encounter (Signed)
 Attempted call to daughter [name] per DPR to request a remote transmission from home monitor for review as requested by HF clinic.  Left call back number.

## 2023-10-25 ENCOUNTER — Ambulatory Visit: Attending: Cardiology

## 2023-10-25 DIAGNOSIS — I5032 Chronic diastolic (congestive) heart failure: Secondary | ICD-10-CM

## 2023-10-25 DIAGNOSIS — Z95810 Presence of automatic (implantable) cardiac defibrillator: Secondary | ICD-10-CM

## 2023-10-25 NOTE — Progress Notes (Signed)
 Spoke with daughter per DPR and advised Lindsey Huber at the HF clinic reviewed and no changes at this time. Encouraged to have patient drink 64 oz fluid daily to help with balance fluid levels.

## 2023-10-25 NOTE — Progress Notes (Signed)
 EPIC Encounter for ICM Monitoring  Patient Name: Lindsey Huber is a 78 y.o. female Date: 10/25/2023 Primary Care Physican: [name] Nicholos BRAVO, PA Primary Cardiologist: Bensimhon Electrophysiologist: Cindie 10/12/2023 Office Weight: 118 lbs        ICM check for HF clinic as requested by Harlene Gainer, NP following 9/2 OV.  Spoke with daughter and patient.  Heart Failure questions reviewed.  Pt asymptomatic for fluid or dehydration sx and feeling fine at this time.     Optivol thoracic impedance suggesting possible dryness starting 10/16/2023.   Prescribed:  Furosemide  40 mg take 2 tablet(s) (80 mg total) by mouth daily. (Increased at 09/27/2023 OV) Potassium 10 mEq take 4 tablet(s) (40 mEq total) by mouth daily.  Labs: 10/12/2023 Creatinine 1.27, BUN 24, Potassium 3.8, Sodium 141, GFR 44  07/07/2023 Creatinine 1.10, BUN 20, Potassium 4.1, Sodium 136, GFR 52  A complete set of results can be found in Results Review.  Recommendations:  Copy sent to Harlene Gainer, NP at [facility] clinic as requested after 9/2 OV.    Follow-up plan: No further ICM clinic phone appointments scheduled.   91 day device clinic remote transmission 11/15/2023.    EP/Cardiology Office Visits: 01/06/2024 with Dr. Cherrie PA/NP.    Copy of ICM check sent to Dr. Cindie.    Remote monitoring is medically necessary for Heart Failure Management.    90 day Daily Thoracic Impedance ICM trend: 07/26/2023 through 10/25/2023.    12-14 Month Thoracic Impedance ICM trend:     Mitzie GORMAN Garner, RN 10/25/2023 9:28 AM

## 2023-10-25 NOTE — Progress Notes (Signed)
  Received: Today [place], Harlene HERO, FNP  Devorah Givhan, Mitzie RAMAN, RN Thank you! No changes for now

## 2023-10-26 ENCOUNTER — Ambulatory Visit (HOSPITAL_COMMUNITY)
Admission: RE | Admit: 2023-10-26 | Discharge: 2023-10-26 | Disposition: A | Discharge status: Home / Self Care | Source: Ambulatory Visit | Attending: Internal Medicine | Admitting: Internal Medicine

## 2023-10-26 ENCOUNTER — Encounter

## 2023-10-26 DIAGNOSIS — I5032 Chronic diastolic (congestive) heart failure: Secondary | ICD-10-CM | POA: Diagnosis present

## 2023-10-26 LAB — BASIC METABOLIC PANEL WITH GFR
Anion gap: 13 (ref 5–15)
BUN: 31 mg/dL — ABNORMAL HIGH (ref 8–23)
CO2: 25 mmol/L (ref 22–32)
Calcium: 9.5 mg/dL (ref 8.9–10.3)
Chloride: 98 mmol/L (ref 98–111)
Creatinine, Ser: 1.9 mg/dL — ABNORMAL HIGH (ref 0.44–1.00)
GFR, Estimated: 27 mL/min — ABNORMAL LOW (ref >60)
Glucose, Bld: 77 mg/dL (ref 70–99)
Potassium: 4.1 mmol/L (ref 3.5–5.1)
Sodium: 136 mmol/L (ref 135–145)

## 2023-10-26 MED ORDER — FUROSEMIDE 40 MG PO TABS: 60.0000 mg | ORAL_TABLET | Freq: Every day | ORAL | Status: DC

## 2023-10-27 NOTE — Telephone Encounter (Signed)
 Received VM from daughter - [facility] does not have Rx for Ofev .   Called [facility] to provide verbal Rx. Upon chart review, unclear what dosing schedule patient is using. Will call pharmacy again when dose regimen is clarified.  Most recent pulmonology note states: Presumed IPF per prior pulmonary notes, on Ofev  150 BID.  Spoke with daughter - she reports patient has been taking Ofev  150mg  once daily at bedtime for years. She does not recall why the dose was changed.   No documentation to suggest why dose was reduced to Ofev  150mg  once daily. Recent chart documentation states she has been taking Ofev  150mg  BID.   Messaging pulmonologist to confirm intended dosing prior to sending new Rx to [facility].   Aleck Puls, PharmD, BCPS, CPP Clinical Pharmacist  [facility]

## 2023-10-28 NOTE — Progress Notes (Signed)
 Remote ICD Transmission

## 2023-10-31 NOTE — Telephone Encounter (Signed)
 Ok - if taking once daily lets keep once daily. I will discuss increase at next appt if felt appropriate.

## 2023-11-01 MED ORDER — OFEV 150 MG PO CAPS: 150.0000 mg | ORAL_CAPSULE | Freq: Every day | ORAL | 2 refills | Status: DC

## 2023-11-01 NOTE — Telephone Encounter (Signed)
 Rx triaged to [facility] for continuation of current dose:  Ofev  150mg  cap once daily.   Aleck Puls, PharmD, BCPS, CPP Clinical Pharmacist  [facility]

## 2023-11-02 ENCOUNTER — Ambulatory Visit (HOSPITAL_COMMUNITY)
Admission: RE | Admit: 2023-11-02 | Discharge: 2023-11-02 | Disposition: A | Discharge status: Home / Self Care | Source: Ambulatory Visit | Attending: Cardiology | Admitting: Cardiology

## 2023-11-02 ENCOUNTER — Ambulatory Visit (HOSPITAL_COMMUNITY): Payer: Self-pay | Admitting: Family Medicine

## 2023-11-02 DIAGNOSIS — I5032 Chronic diastolic (congestive) heart failure: Secondary | ICD-10-CM | POA: Diagnosis present

## 2023-11-02 LAB — BASIC METABOLIC PANEL WITH GFR
Anion gap: 12 (ref 5–15)
BUN: 36 mg/dL — ABNORMAL HIGH (ref 8–23)
CO2: 24 mmol/L (ref 22–32)
Calcium: 9.6 mg/dL (ref 8.9–10.3)
Chloride: 103 mmol/L (ref 98–111)
Creatinine, Ser: 1.22 mg/dL — ABNORMAL HIGH (ref 0.44–1.00)
GFR, Estimated: 46 mL/min — ABNORMAL LOW (ref >60)
Glucose, Bld: 78 mg/dL (ref 70–99)
Potassium: 3.9 mmol/L (ref 3.5–5.1)
Sodium: 139 mmol/L (ref 135–145)

## 2023-11-14 ENCOUNTER — Telehealth: Payer: Self-pay | Admitting: Pulmonary Disease

## 2023-11-14 NOTE — Telephone Encounter (Signed)
 Contacted [facility] for Rx clarification. Confirmed this is correct - see previous thread.

## 2023-11-14 NOTE — Telephone Encounter (Signed)
 Ofev  150mg  once daily - see Ofev  [facility] encounter for further details.

## 2023-11-14 NOTE — Telephone Encounter (Signed)
 Copied from CRM #[id number]. Topic: Clinical - Prescription Issue >> Nov 14, 2023 10:56 AM Essie A wrote: Reason for CRM: Javonna from [facility] caled.  Need a frequency change for patient for Nintedanib  (OFEV ) 150 MG CAPS.  Please return  the call at [phone].

## 2023-11-15 ENCOUNTER — Ambulatory Visit: Payer: Medicare HMO

## 2023-11-15 DIAGNOSIS — I5032 Chronic diastolic (congestive) heart failure: Secondary | ICD-10-CM

## 2023-11-16 LAB — CUP PACEART REMOTE DEVICE CHECK
Battery Remaining Longevity: 34 mo
Battery Voltage: 2.95 V
Brady Statistic RV Percent Paced: 99.25 %
Date Time Interrogation Session: 20251021001806
HighPow Impedance: 60 Ohm
Implantable Lead Connection Status: 753985
Implantable Lead Implant Date: 20120920
Implantable Lead Location: 753862
Implantable Pulse Generator Implant Date: 20190809
Lead Channel Impedance Value: 247 Ohm
Lead Channel Impedance Value: 304 Ohm
Lead Channel Pacing Threshold Amplitude: 0.625 V
Lead Channel Pacing Threshold Pulse Width: 0.4 ms
Lead Channel Sensing Intrinsic Amplitude: 8.25 mV
Lead Channel Sensing Intrinsic Amplitude: 8.25 mV
Lead Channel Setting Pacing Amplitude: 1.25 V
Lead Channel Setting Pacing Pulse Width: 0.4 ms
Lead Channel Setting Sensing Sensitivity: 0.3 mV
Zone Setting Status: 755011
Zone Setting Status: 755011

## 2023-11-18 NOTE — Progress Notes (Signed)
 Remote ICD Transmission

## 2023-11-21 ENCOUNTER — Ambulatory Visit: Payer: Self-pay | Admitting: Cardiology

## 2023-11-28 ENCOUNTER — Encounter: Payer: Self-pay | Admitting: Radiology

## 2023-11-30 ENCOUNTER — Ambulatory Visit: Payer: Medicare HMO | Admitting: Internal Medicine

## 2023-11-30 ENCOUNTER — Encounter: Payer: Self-pay | Admitting: Internal Medicine

## 2023-11-30 ENCOUNTER — Other Ambulatory Visit

## 2023-11-30 VITALS — BP 150/90 | Ht 63.0 in | Wt 114.0 lb

## 2023-11-30 DIAGNOSIS — E2749 Other adrenocortical insufficiency: Secondary | ICD-10-CM

## 2023-11-30 DIAGNOSIS — E042 Nontoxic multinodular goiter: Secondary | ICD-10-CM

## 2023-11-30 MED ORDER — PREDNISONE 5 MG PO TABS: 5.0000 mg | ORAL_TABLET | Freq: Every day | ORAL | 3 refills | Status: DC

## 2023-11-30 NOTE — Patient Instructions (Signed)
-   Prednisone  5 mg 1 tablet EVERY morning with Breakfast    ADRENAL INSUFFICIENCY SICK DAY RULES:  Should you face an extreme emotional or physical stress such as trauma, surgery or acute illness, this will require extra steroid coverage so that the body can meet that stress.   Without increasing the steroid dose you may experience severe weakness, headache, dizziness, nausea and vomiting and possibly a more serious deterioration in health.  Typically the dose of steroids will only need to be increased for a couple of days if you have an illness that is transient and managed in the community.   If you are unable to take/absorb an increased dose of steroids orally because of vomiting or diarrhea, you will urgently require steroid injections and should present to an Emergency Department.  The general advice for any serious illness is as follows: Double the normal daily steroid dose for up to 3 days if you have a temperature of more than 37.50C (99.79F) with signs of sickness, or severe emotional or physical distress Contact your primary care doctor and Endocrinologist if the illness worsens or it lasts for more than 3 days.  In cases of severe illness, urgent medical assistance should be promptly sought. If you experience vomiting/diarrhea or are unable to take steroids by mouth, please administer the Hydrocortisone injection kit and seek urgent medical help.

## 2023-11-30 NOTE — Progress Notes (Unsigned)
 Name: Lindsey Huber  MRN/ DOB: [id number], 1945-02-17    Age/ Sex: 78 y.o., female    PCP: [name], [name], PA   Reason for Endocrinology Evaluation: MNG, adrenal insufficiency      Date of Initial Endocrinology Evaluation: 08/20/2020    HPI: Lindsey Huber is a 78 y.o. female with a past medical history of MNG, Gout, Adrenal Insufficiency, CHF, gout  and SLE. The patient presented for initial endocrinology clinic visit on 08/20/2020 for consultative assistance with her MNG/ adrenal Insufficiency .    Moved from [place]    MNG History:  Has been diagnosed with MNG years ago. She is not sure of thyroid  biopsies in the past . Has been on LT-4 replacement daily    No Fh of thyroid  disease   Thyroid  ultrasound on 08/29/2020 revealing multinodular goiter with a peripherally calcified nodule in the left mid gland meets consensus criteria to consider fine-needle aspiration biopsy.   She is S/P benign FNA 04/2021  We discontinued levothyroxine  25 mcg in November, 2024 with a low TSH of 0.295u IU/ML.  Repeat TFTs were normal in January, 2025    Adrenal History :  She was started on [facility] in 02/2020 following an admission for septic shock . Prior to that she was on Prednisone  for years , she was on this though rheumatology for RA   ACTH was at the low end of normal at 6 PG/mL with low normal plasma cortisol at 3.7 UG/DL 02/7975    SUBJECTIVE:    Today (11/30/23):  Lindsey Huber is here for follow-up on multinodular goiter and secondary adrenal insufficiency She is accompanied by her grand daughter  Since her last visit here the patient did sustain a fall in December, 2024, complicated by a left clavicular fracture.  She was placed in a sling.   She follows with pulmonary for interstitial lung disease and asthma  The patient presented to the ED in March, 2025 for GI bleed, she is s/p colonoscopy in April, 2025  She continues to follow-up with neurology for neurocognitive  disorder, focal onset seizures Patient also follows with cardiology for CHF, cardiac arrhythmia and secondary hypercoagulable state   Patient also follows with rheumatology for SLE and osteoarthritis, on hydroxychloroquine   No clavicular pain  SOB is worsening in nature  as well as cough , productive in nature  No local neck swelling , but has chocking sensation to medication, no heartburn recently Has occasional palpitations  Has alternating constipation or diarrhea   NO Biotin  Has medical alert bracelet     HOME ENDOCRINE MEDICATIONS : Prednisone  5 mg daily    HISTORY:  Past Medical History:  Past Medical History:  Diagnosis Date   ABLA (acute blood loss anemia) 07/04/2022   Achalasia    Acquired hypothyroidism 08/20/2020   Acute lower GI bleeding 11/02/2022   Acute metabolic encephalopathy 07/25/2021   AICD (automatic cardioverter/defibrillator) present 11/14/2019   Allergic rhinitis    Ankle fracture 03/25/2022   Aortic valve disorder 03/26/2002   Atherosclerosis of abdominal aorta 05/01/2020   Cardiomyopathy (HCC)    CHB (complete heart block) (HCC) 08/18/2017   CHF (congestive heart failure) (HCC)    Cholelithiasis without obstruction 05/01/2020   Chronic gouty arthritis    Chronic heart failure with preserved ejection fraction (HCC) 05/01/2020   Chronic kidney disease (CKD), active medical management without dialysis, stage 3 (moderate) (HCC) 05/01/2020   Closed torus fracture of distal end of right radius with delayed healing 08/12/2021  Delusional thoughts (HCC)    Diverticulosis of colon 05/01/2020   Epistaxis    Essential (primary) hypertension 08/18/2017   Gastroesophageal reflux disease 05/01/2020   Gastrointestinal hemorrhage    Hemorrhagic shock (HCC) 07/02/2022   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 11/14/2019   Hypercoagulability due to atrial fibrillation (HCC) 05/01/2020   Hyperlipidemia 11/14/2019    Hypocalcemia 12/14/2020   Hypoglycemia    Hypokalemia 12/14/2020   Hypomagnesemia 12/14/2020   Hypotension 12/14/2020   Idiopathic pulmonary fibrosis (HCC) 05/01/2020   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   Iron deficiency anemia    Long term (current) use of anticoagulants    Malnutrition of mild degree [name]: 75% to less than 90% of standard weight)    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/12/2021   Mitral and aortic incompetence 11/14/2019   Non-rheumatic atrial fibrillation (HCC) 05/01/2020   Non-toxic multinodular goiter 08/20/2020   NSVT (nonsustained ventricular tachycardia) (HCC) 08/18/2017   Oropharyngeal dysphagia    Osteoarthritis of hip 05/01/2020   Osteoarthritis of knee 05/01/2020   Osteopenia of neck of left femur    Pain of left hip joint 12/12/2017   Proteinuria 05/01/2020   Raynaud's disease    Recurrent falls 04/09/2021   Rheumatoid arthritis (HCC)    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   Seizures (HCC)    Septic shock (HCC) 03/10/2020   Slow transit constipation    Systemic lupus erythematosus (HCC) 05/01/2020   Thrombophilia    Transient ischemic attack    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   Vitamin D  deficiency    Past Surgical History:  Past Surgical History:  Procedure Laterality Date   BREAST BIOPSY Left    CARDIAC VALVE SURGERY     CARPAL TUNNEL RELEASE     COLONOSCOPY N/A 04/26/2023   Procedure: COLONOSCOPY;  Surgeon: Burnette Fallow, MD;  Location: [facility] ENDOSCOPY;  Service: Gastroenterology;  Laterality: N/A;   COLONOSCOPY WITH PROPOFOL  N/A 12/20/2020   Procedure: COLONOSCOPY WITH PROPOFOL ;  Surgeon: Burnette Fallow, MD;  Location: [facility] ENDOSCOPY;  Service: Endoscopy;  Laterality: N/A;   HEMORRHOID SURGERY     PACEMAKER INSERTION     RIGHT/LEFT HEART CATH AND CORONARY ANGIOGRAPHY N/A 11/27/2020   Procedure: RIGHT/LEFT HEART CATH AND CORONARY ANGIOGRAPHY;  Surgeon: Cherrie Toribio SAUNDERS, MD;  Location: [facility] INVASIVE CV LAB;   Service: Cardiovascular;  Laterality: N/A;   TONSILLECTOMY      Social History:  reports that she has never smoked. She has been exposed to tobacco smoke. She has never used smokeless tobacco. She reports that she does not drink alcohol  and does not use drugs. Family History: family history includes Cancer in her brother; Diabetes in her brother; Heart disease in her father, mother, and sister; Hypertension in her father and mother; Osteoarthritis in her father and mother; Rheum arthritis in her mother.   HOME MEDICATIONS: Allergies as of 11/30/2023       Reactions   Diprivan  [propofol ] Shortness Of Breath, Other (See Comments)   Caused asthma   Other Other (See Comments)   Patient is to NOT EAT any foods with husks or tree nuts, popcorn   Tambocor [flecainide] Other (See Comments)   Unknown reaction   Norvasc [amlodipine] Other (See Comments)   Fatigue Syncope   Pacerone [amiodarone] Other (See Comments)   Delirium Confusion Psychosis        Medication List        Accurate as of November 30, 2023  7:07 AM. If you have any questions,  ask your nurse or doctor.          acetaminophen  500 MG tablet Commonly known as: TYLENOL  Take 1,000 mg by mouth every 6 (six) hours as needed (pain).   albuterol  108 (90 Base) MCG/ACT inhaler Commonly known as: VENTOLIN  HFA Inhale 2 puffs into the lungs every 6 (six) hours as needed for wheezing or shortness of breath.   allopurinol  100 MG tablet Commonly known as: ZYLOPRIM  TAKE 1 TABLET BY MOUTH EVERY DAY   amoxicillin 500 MG capsule Commonly known as: AMOXIL 4 capsules by mouth 1 hour before dental procedure.   apixaban  2.5 MG Tabs tablet Commonly known as: ELIQUIS  Take 1 tablet (2.5 mg total) by mouth 2 (two) times daily.   Breztri  Aerosphere 160-9-4.8 MCG/ACT Aero inhaler Generic drug: budesonide -glycopyrrolate -formoterol  Inhale 2 puffs into the lungs 2 (two) times daily as needed (for flares).   calcium -vitamin D   500-5 MG-MCG tablet Commonly known as: OSCAL WITH D Take 2 tablets by mouth 2 (two) times daily.   cetirizine  HCl 5 MG/5ML Soln Commonly known as: Zyrtec  Take 1 mL (1 mg total) by mouth 2 (two) times daily as needed for rhinitis or allergies.   fluticasone  50 MCG/ACT nasal spray Commonly known as: FLONASE  Place 1 spray into both nostrils as needed for allergies or rhinitis.   furosemide  40 MG tablet Commonly known as: LASIX  Take 1.5 tablets (60 mg total) by mouth daily.   hydroxychloroquine  200 MG tablet Commonly known as: PLAQUENIL  Take 1 tablet (200 mg total) by mouth daily.   levETIRAcetam  500 MG tablet Commonly known as: KEPPRA  Take 1 tablet (500 mg total) by mouth 2 (two) times daily.   magnesium  oxide 400 (240 Mg) MG tablet Commonly known as: MAG-OX TAKE 1 TABLET BY MOUTH EVERY DAY   MiraLax  17 GM/SCOOP powder Generic drug: polyethylene glycol powder Take 17 g by mouth daily as needed for mild constipation.   Ofev  150 MG Caps Generic drug: Nintedanib  Take 1 capsule (150 mg total) by mouth at bedtime.   OLANZapine  2.5 MG tablet Commonly known as: ZYPREXA  TAKE 1 TABLET BY MOUTH EVERYDAY AT BEDTIME   pantoprazole  40 MG tablet Commonly known as: Protonix  Take 1 tablet (40 mg total) by mouth 2 (two) times daily.   potassium chloride  10 MEQ tablet Commonly known as: KLOR-CON  M Take 2 tablets (20 mEq total) by mouth daily. Further refill requests for this medication need to be approved by PCP.   potassium chloride  10 MEQ tablet Commonly known as: KLOR-CON  Take 4 tablets (40 mEq total) by mouth daily.   predniSONE  5 MG tablet Commonly known as: DELTASONE  Take 1 tablet (5 mg total) by mouth daily with breakfast. Patient to double up dose during sick day rule   Vitamin D3 25 MCG (1000 UT) Caps Take 1,000 Units by mouth daily with lunch.          REVIEW OF SYSTEMS: A comprehensive ROS was conducted with the patient and is negative except as per  HPI    OBJECTIVE:  VS: There were no vitals taken for this visit.   Wt Readings from Last 3 Encounters:  10/12/23 118 lb 9.6 oz (53.8 kg)  09/30/23 119 lb 9.6 oz (54.3 kg)  07/28/23 119 lb (54 kg)     EXAM: General: Pt appears well and is in NAD  Neck: General: Supple without adenopathy. Thyroid : Thyroid  size normal.  Left thyroid  nodule appreciated  Lungs: Clear with good BS bilat with no rales, rhonchi, or wheezes  Heart:  Auscultation: RRR.  Extremities:  BL LE: No pretibial edema normal  Mental Status: Judgment, insight: Intact     DATA REVIEWED:  Latest Reference Range & Units 11/02/23 14:40  Sodium 135 - 145 mmol/L 139  Potassium 3.5 - 5.1 mmol/L 3.9  Chloride 98 - 111 mmol/L 103  CO2 22 - 32 mmol/L 24  Glucose 70 - 99 mg/dL 78  BUN 8 - 23 mg/dL 36 (H)  Creatinine 9.55 - 1.00 mg/dL 8.77 (H)  Calcium  8.9 - 10.3 mg/dL 9.6  Anion gap 5 - 15  12  GFR, Estimated >60 mL/min 46 (L)       Thyroid  ultrasound 12/16/2022 Estimated total number of nodules >/= 1 cm: 2   Number of spongiform nodules >/=  2 cm not described below (TR1): 0   Number of mixed cystic and solid nodules >/= 1.5 cm not described below (TR2): 0   _________________________________________________________   Similar benign appearing pseudo nodularity in the right mid thyroid  (labeled 1, 2.7 cm, previously 2.2 cm).   Similar benign appearing spongiform nodule in the right mid thyroid  (labeled 2, 0.7 cm, previously 0.8 cm).   Similar appearance of previously biopsied left mid solid thyroid  nodule (labeled 3, 1.8 cm, previously 1.7 cm).   No cervical lymphadenopathy.   IMPRESSION: Similar appearance of previously biopsied left mid solid thyroid  nodule (labeled 3, 1.8 cm, previously 1.7 cm). Recommend correlation with prior biopsy results.   FNA left mid nodule 05/20/2021   Clinical History: Left mid 2.0cm; Other 2 dimensions: 1.6 x 1.5cm, Solid  /almost completely solid, peripheral  calcifications, TI-RADS total  points 5  Specimen Submitted:  A. THYROID , LEFT MID, FINE NEEDLE ASPIRATION:    FINAL MICROSCOPIC DIAGNOSIS:  - Consistent with benign follicular nodule (Bethesda category II)    ASSESSMENT/PLAN/RECOMMENDATIONS:   Multinodular Goiter :   -Patient with occasional local neck symptoms -She is s/p benign FNA of the left nodule 04/2021 -Will proceed with repeat thyroid  ultrasound - She has been off levothyroxine  since 2024 - Repeat TFTs today     2.  Secondary adrenal insufficiency:   -This is due to chronic use of glucocorticoids for rheumatoid arthritis.  She was on prednisone  for years which was switched to hydrocortisone since her hospitalization in [place]  in 02/2020 -She has a medical alert bracelet   Medication  Continue prednisone  5 mg daily with breakfast  3. HTN:  - Patient asymptomatic - She has not taken furosemide  this morning yet, discussed the importance of taking medication at the same time every day - Will defer further management to cardiology  Follow-up in 1 yr       Signed electronically by: Stefano Redgie Butts, MD  [facility] Endocrinology  [facility] [address] [place], [place] [address] Phone: [phone] FAX: [phone]   CC: [name], [name], PA [address] [place] [address] Phone: [phone] Fax: [phone]   Return to Endocrinology clinic as below: Future Appointments  Date Time Provider Department Center  11/30/2023  9:50 AM Loriene Taunton, Donell Redgie, MD [facility] None  12/27/2023  2:30 PM Skeet Juliene SAUNDERS, DO [facility] None  01/06/2024  2:00 PM [facility] ECHO OP 1 [facility]  01/06/2024  3:00 PM Bensimhon, Toribio SAUNDERS, MD [facility] None  01/30/2024  1:00 PM Jeannetta Lonni ORN, MD [facility] None  02/14/2024  7:00 AM [facility] HVT DEVICE REMOTES [facility] H&V  05/15/2024  7:00 AM CVD HVT DEVICE REMOTES [facility] H&V  08/14/2024  7:00 AM  [facility]VD HVT DEVICE  REMOTES [facility] H&V  11/13/2024  7:00 AM CVD HVT DEVICE REMOTES [facility] H&V  02/12/2025  7:00 AM CVD HVT DEVICE REMOTES [facility] H&V

## 2023-12-01 ENCOUNTER — Ambulatory Visit: Payer: Self-pay | Admitting: Internal Medicine

## 2023-12-01 LAB — TSH: TSH: 3.53 m[IU]/L (ref 0.40–4.50)

## 2023-12-01 LAB — T4, FREE: Free T4: 1.3 ng/dL (ref 0.8–1.8)

## 2023-12-08 ENCOUNTER — Ambulatory Visit
Admission: RE | Admit: 2023-12-08 | Discharge: 2023-12-08 | Disposition: A | Discharge status: Home / Self Care | Source: Ambulatory Visit | Attending: Internal Medicine | Admitting: Internal Medicine

## 2023-12-08 DIAGNOSIS — E042 Nontoxic multinodular goiter: Secondary | ICD-10-CM

## 2023-12-12 ENCOUNTER — Encounter: Payer: Self-pay | Admitting: Internal Medicine

## 2023-12-18 ENCOUNTER — Other Ambulatory Visit: Payer: Self-pay

## 2023-12-18 ENCOUNTER — Inpatient Hospital Stay (HOSPITAL_COMMUNITY)
Admission: EM | Admit: 2023-12-18 | Discharge: 2023-12-21 | DRG: 378 | Disposition: A | Discharge status: Home / Self Care | Attending: Internal Medicine | Admitting: Internal Medicine

## 2023-12-18 ENCOUNTER — Encounter (HOSPITAL_COMMUNITY): Payer: Self-pay | Admitting: *Deleted

## 2023-12-18 DIAGNOSIS — Z8673 Personal history of transient ischemic attack (TIA), and cerebral infarction without residual deficits: Secondary | ICD-10-CM

## 2023-12-18 DIAGNOSIS — Z8719 Personal history of other diseases of the digestive system: Secondary | ICD-10-CM

## 2023-12-18 DIAGNOSIS — D6832 Hemorrhagic disorder due to extrinsic circulating anticoagulants: Secondary | ICD-10-CM | POA: Diagnosis present

## 2023-12-18 DIAGNOSIS — M329 Systemic lupus erythematosus, unspecified: Secondary | ICD-10-CM | POA: Diagnosis present

## 2023-12-18 DIAGNOSIS — Z7951 Long term (current) use of inhaled steroids: Secondary | ICD-10-CM

## 2023-12-18 DIAGNOSIS — K449 Diaphragmatic hernia without obstruction or gangrene: Secondary | ICD-10-CM | POA: Diagnosis present

## 2023-12-18 DIAGNOSIS — K2901 Acute gastritis with bleeding: Principal | ICD-10-CM | POA: Diagnosis present

## 2023-12-18 DIAGNOSIS — M069 Rheumatoid arthritis, unspecified: Secondary | ICD-10-CM | POA: Diagnosis present

## 2023-12-18 DIAGNOSIS — K5730 Diverticulosis of large intestine without perforation or abscess without bleeding: Secondary | ICD-10-CM | POA: Diagnosis present

## 2023-12-18 DIAGNOSIS — G40909 Epilepsy, unspecified, not intractable, without status epilepticus: Secondary | ICD-10-CM | POA: Diagnosis present

## 2023-12-18 DIAGNOSIS — T380X5A Adverse effect of glucocorticoids and synthetic analogues, initial encounter: Secondary | ICD-10-CM | POA: Diagnosis present

## 2023-12-18 DIAGNOSIS — I4891 Unspecified atrial fibrillation: Secondary | ICD-10-CM | POA: Diagnosis present

## 2023-12-18 DIAGNOSIS — D689 Coagulation defect, unspecified: Secondary | ICD-10-CM

## 2023-12-18 DIAGNOSIS — Z950 Presence of cardiac pacemaker: Secondary | ICD-10-CM | POA: Diagnosis not present

## 2023-12-18 DIAGNOSIS — I5032 Chronic diastolic (congestive) heart failure: Secondary | ICD-10-CM | POA: Diagnosis present

## 2023-12-18 DIAGNOSIS — J309 Allergic rhinitis, unspecified: Secondary | ICD-10-CM | POA: Diagnosis present

## 2023-12-18 DIAGNOSIS — T45515A Adverse effect of anticoagulants, initial encounter: Secondary | ICD-10-CM | POA: Diagnosis present

## 2023-12-18 DIAGNOSIS — I442 Atrioventricular block, complete: Secondary | ICD-10-CM | POA: Diagnosis present

## 2023-12-18 DIAGNOSIS — E039 Hypothyroidism, unspecified: Secondary | ICD-10-CM | POA: Diagnosis present

## 2023-12-18 DIAGNOSIS — I700 Atherosclerosis of aorta: Secondary | ICD-10-CM | POA: Diagnosis present

## 2023-12-18 DIAGNOSIS — Z7952 Long term (current) use of systemic steroids: Secondary | ICD-10-CM

## 2023-12-18 DIAGNOSIS — D62 Acute posthemorrhagic anemia: Secondary | ICD-10-CM | POA: Diagnosis present

## 2023-12-18 DIAGNOSIS — J84112 Idiopathic pulmonary fibrosis: Secondary | ICD-10-CM | POA: Diagnosis present

## 2023-12-18 DIAGNOSIS — F03A0 Unspecified dementia, mild, without behavioral disturbance, psychotic disturbance, mood disturbance, and anxiety: Secondary | ICD-10-CM | POA: Diagnosis present

## 2023-12-18 DIAGNOSIS — Z953 Presence of xenogenic heart valve: Secondary | ICD-10-CM

## 2023-12-18 DIAGNOSIS — N183 Chronic kidney disease, stage 3 unspecified: Secondary | ICD-10-CM | POA: Diagnosis present

## 2023-12-18 DIAGNOSIS — K219 Gastro-esophageal reflux disease without esophagitis: Secondary | ICD-10-CM | POA: Diagnosis present

## 2023-12-18 DIAGNOSIS — I130 Hypertensive heart and chronic kidney disease with heart failure and stage 1 through stage 4 chronic kidney disease, or unspecified chronic kidney disease: Secondary | ICD-10-CM | POA: Diagnosis present

## 2023-12-18 DIAGNOSIS — Z95810 Presence of automatic (implantable) cardiac defibrillator: Secondary | ICD-10-CM

## 2023-12-18 DIAGNOSIS — I4821 Permanent atrial fibrillation: Secondary | ICD-10-CM

## 2023-12-18 DIAGNOSIS — Z833 Family history of diabetes mellitus: Secondary | ICD-10-CM

## 2023-12-18 DIAGNOSIS — I7300 Raynaud's syndrome without gangrene: Secondary | ICD-10-CM | POA: Diagnosis present

## 2023-12-18 DIAGNOSIS — Z79899 Other long term (current) drug therapy: Secondary | ICD-10-CM

## 2023-12-18 DIAGNOSIS — E2749 Other adrenocortical insufficiency: Secondary | ICD-10-CM | POA: Diagnosis present

## 2023-12-18 DIAGNOSIS — E785 Hyperlipidemia, unspecified: Secondary | ICD-10-CM | POA: Diagnosis present

## 2023-12-18 DIAGNOSIS — I255 Ischemic cardiomyopathy: Secondary | ICD-10-CM | POA: Diagnosis present

## 2023-12-18 DIAGNOSIS — K922 Gastrointestinal hemorrhage, unspecified: Principal | ICD-10-CM | POA: Diagnosis present

## 2023-12-18 DIAGNOSIS — M1A9XX0 Chronic gout, unspecified, without tophus (tophi): Secondary | ICD-10-CM | POA: Diagnosis present

## 2023-12-18 DIAGNOSIS — Z8261 Family history of arthritis: Secondary | ICD-10-CM

## 2023-12-18 DIAGNOSIS — Z8249 Family history of ischemic heart disease and other diseases of the circulatory system: Secondary | ICD-10-CM

## 2023-12-18 DIAGNOSIS — J321 Chronic frontal sinusitis: Secondary | ICD-10-CM | POA: Diagnosis present

## 2023-12-18 DIAGNOSIS — Z7901 Long term (current) use of anticoagulants: Secondary | ICD-10-CM

## 2023-12-18 HISTORY — DX: Sleep apnea, unspecified: G47.30

## 2023-12-18 HISTORY — DX: Presence of cardiac pacemaker: Z95.0

## 2023-12-18 HISTORY — DX: Personal history of other medical treatment: Z92.89

## 2023-12-18 LAB — CBC
HCT: 31.9 % — ABNORMAL LOW (ref 36.0–46.0)
Hemoglobin: 10 g/dL — ABNORMAL LOW (ref 12.0–15.0)
MCH: 30.5 pg (ref 26.0–34.0)
MCHC: 31.3 g/dL (ref 30.0–36.0)
MCV: 97.3 fL (ref 80.0–100.0)
Platelets: 168 K/uL (ref 150–400)
RBC: 3.28 MIL/uL — ABNORMAL LOW (ref 3.87–5.11)
RDW: 17 % — ABNORMAL HIGH (ref 11.5–15.5)
WBC: 8.4 K/uL (ref 4.0–10.5)
nRBC: 0 % (ref 0.0–0.2)

## 2023-12-18 LAB — COMPREHENSIVE METABOLIC PANEL WITH GFR
ALT: 24 U/L (ref 0–44)
AST: 39 U/L (ref 15–41)
Albumin: 3.4 g/dL — ABNORMAL LOW (ref 3.5–5.0)
Alkaline Phosphatase: 54 U/L (ref 38–126)
Anion gap: 16 — ABNORMAL HIGH (ref 5–15)
BUN: 40 mg/dL — ABNORMAL HIGH (ref 8–23)
CO2: 20 mmol/L — ABNORMAL LOW (ref 22–32)
Calcium: 9 mg/dL (ref 8.9–10.3)
Chloride: 101 mmol/L (ref 98–111)
Creatinine, Ser: 1.65 mg/dL — ABNORMAL HIGH (ref 0.44–1.00)
GFR, Estimated: 32 mL/min — ABNORMAL LOW (ref >60)
Glucose, Bld: 76 mg/dL (ref 70–99)
Potassium: 4.1 mmol/L (ref 3.5–5.1)
Sodium: 137 mmol/L (ref 135–145)
Total Bilirubin: 1.1 mg/dL (ref 0.0–1.2)
Total Protein: 7.4 g/dL (ref 6.5–8.1)

## 2023-12-18 LAB — PROTIME-INR
INR: 1.4 — ABNORMAL HIGH (ref 0.8–1.2)
Prothrombin Time: 17.8 s — ABNORMAL HIGH (ref 11.4–15.2)

## 2023-12-18 LAB — POC OCCULT BLOOD, ED: Fecal Occult Bld: POSITIVE — AB

## 2023-12-18 MED ORDER — ONDANSETRON HCL 4 MG PO TABS: 4.0000 mg | ORAL_TABLET | Freq: Four times a day (QID) | ORAL | Status: DC | PRN

## 2023-12-18 MED ORDER — VITAMIN D 25 MCG (1000 UNIT) PO TABS
1000.0000 [IU] | ORAL_TABLET | Freq: Every day | ORAL | Status: DC
  Administered 2023-12-19 – 2023-12-21 (×3): 1000 [IU] via ORAL
  Filled 2023-12-18 (×3): qty 1

## 2023-12-18 MED ORDER — BISACODYL 5 MG PO TBEC: 5.0000 mg | DELAYED_RELEASE_TABLET | Freq: Every day | ORAL | Status: DC | PRN

## 2023-12-18 MED ORDER — ALLOPURINOL 100 MG PO TABS
100.0000 mg | ORAL_TABLET | Freq: Every day | ORAL | Status: DC
  Administered 2023-12-19 – 2023-12-21 (×3): 100 mg via ORAL
  Filled 2023-12-18 (×3): qty 1

## 2023-12-18 MED ORDER — POTASSIUM CHLORIDE CRYS ER 20 MEQ PO TBCR
20.0000 meq | EXTENDED_RELEASE_TABLET | Freq: Two times a day (BID) | ORAL | Status: DC
  Administered 2023-12-19 – 2023-12-21 (×5): 20 meq via ORAL
  Filled 2023-12-18 (×6): qty 1

## 2023-12-18 MED ORDER — OLANZAPINE 5 MG PO TABS
2.5000 mg | ORAL_TABLET | Freq: Every day | ORAL | Status: DC
  Administered 2023-12-19 – 2023-12-20 (×2): 2.5 mg via ORAL
  Filled 2023-12-18 (×4): qty 1

## 2023-12-18 MED ORDER — ACETAMINOPHEN 325 MG PO TABS: 650.0000 mg | ORAL_TABLET | Freq: Four times a day (QID) | ORAL | Status: DC | PRN

## 2023-12-18 MED ORDER — ALBUTEROL SULFATE (2.5 MG/3ML) 0.083% IN NEBU: 3.0000 mL | INHALATION_SOLUTION | Freq: Four times a day (QID) | RESPIRATORY_TRACT | Status: DC | PRN

## 2023-12-18 MED ORDER — MAGNESIUM OXIDE -MG SUPPLEMENT 400 (240 MG) MG PO TABS
400.0000 mg | ORAL_TABLET | Freq: Every day | ORAL | Status: DC
  Administered 2023-12-19 – 2023-12-21 (×3): 400 mg via ORAL
  Filled 2023-12-18 (×3): qty 1

## 2023-12-18 MED ORDER — PANTOPRAZOLE SODIUM 40 MG PO TBEC
40.0000 mg | DELAYED_RELEASE_TABLET | Freq: Two times a day (BID) | ORAL | Status: DC
  Administered 2023-12-19 – 2023-12-21 (×6): 40 mg via ORAL
  Filled 2023-12-18 (×6): qty 1

## 2023-12-18 MED ORDER — OYSTER SHELL CALCIUM/D3 500-5 MG-MCG PO TABS
2.0000 | ORAL_TABLET | Freq: Two times a day (BID) | ORAL | Status: DC
  Administered 2023-12-19 – 2023-12-21 (×4): 2 via ORAL
  Filled 2023-12-18 (×6): qty 2

## 2023-12-18 MED ORDER — NINTEDANIB ESYLATE 150 MG PO CAPS
150.0000 mg | ORAL_CAPSULE | Freq: Every day | ORAL | Status: DC
  Administered 2023-12-19 – 2023-12-20 (×2): 150 mg via ORAL
  Filled 2023-12-18 (×3): qty 1

## 2023-12-18 MED ORDER — PREDNISONE 5 MG PO TABS
5.0000 mg | ORAL_TABLET | Freq: Every day | ORAL | Status: DC
  Administered 2023-12-19 – 2023-12-21 (×2): 5 mg via ORAL
  Filled 2023-12-18 (×2): qty 1

## 2023-12-18 MED ORDER — ONDANSETRON HCL 4 MG/2ML IJ SOLN: 4.0000 mg | Freq: Four times a day (QID) | INTRAMUSCULAR | Status: DC | PRN

## 2023-12-18 MED ORDER — LEVETIRACETAM 500 MG PO TABS
500.0000 mg | ORAL_TABLET | Freq: Two times a day (BID) | ORAL | Status: DC
  Administered 2023-12-19 – 2023-12-21 (×6): 500 mg via ORAL
  Filled 2023-12-18 (×6): qty 1

## 2023-12-18 MED ORDER — SENNOSIDES-DOCUSATE SODIUM 8.6-50 MG PO TABS: 1.0000 | ORAL_TABLET | Freq: Every evening | ORAL | Status: DC | PRN

## 2023-12-18 MED ORDER — BUDESON-GLYCOPYRROL-FORMOTEROL 160-9-4.8 MCG/ACT IN AERO
2.0000 | INHALATION_SPRAY | Freq: Two times a day (BID) | RESPIRATORY_TRACT | Status: DC
  Administered 2023-12-19 – 2023-12-21 (×4): 2 via RESPIRATORY_TRACT
  Filled 2023-12-18: qty 5.9

## 2023-12-18 MED ORDER — ACETAMINOPHEN 650 MG RE SUPP
650.0000 mg | Freq: Four times a day (QID) | RECTAL | Status: DC | PRN
Start: 2023-12-18 — End: 2023-12-21

## 2023-12-18 MED ORDER — AMOXICILLIN 500 MG PO CAPS
500.0000 mg | ORAL_CAPSULE | Freq: Three times a day (TID) | ORAL | Status: DC
  Administered 2023-12-19 (×2): 500 mg via ORAL
  Filled 2023-12-18 (×4): qty 1

## 2023-12-18 NOTE — ED Triage Notes (Signed)
 Pt here via [facility] from home.  States hx of GI bleeding.  Takes Eliquis .  Noticed dark streaks in bm 3 days ago.  Today small amount of black, grainy stools.    BP 147/77 Hr 84 Rr 17 Sats 100%

## 2023-12-18 NOTE — H&P (Addendum)
 History and Physical  Lindsey Huber FMW:[id number] DOB: 1945/10/14 DOA: 12/18/2023  PCP: [name], [name], PA   Chief Complaint: Rectal bleeding  HPI: Lindsey Huber is a 78 y.o. female with medical history significant for HTN, HLD, A-fib on Eliquis , CHB s/p PPM, GERD, CKD 3, hypothyroidism, chronic diastolic CHF, ischemic cardiomyopathy s/p AICD, aortic valve replacement, mitral valve replacement, tricuspid valve replacement, mild dementia, seizure, asthma, acquired hypothyroidism, secondary adrenal insufficiency, pulmonary fibrosis, RA, Raynaud's, lupus, gout, anemia, diverticulosis and recurrent GI bleeds presented to the ED for evaluation of rectal bleeding.  Patient reports that last Friday, she notices traces of bright red blood per rectum.  She followed up with her PCP and was given a kit to test for bleeding however since then she did not have any further rectal bleeding.  Today, she started having bright red blood from her rectum again that eventually turned black.  She endorsed mild fatigue and shortness of breath but denies any abdominal pain, nausea, vomiting, fevers, chills, dizziness, hematuria, hematemesis or hemoptysis, hemoptysis or chest pain.  ED Course: Initial vitals show patient afebrile, HR 60-70s, SBP 120-140s, SpO2 100% on room air. Initial labs significant for BUNs/creatinine 40/1.65, bicarb 20, WBC 8.4, Hgb 10.0, platelet 168, PT/INR 17.8/1.4, positive FOBT. GI was consulted for evaluation. [facility] was consulted for admission.   Review of Systems: Please see HPI for pertinent positives and negatives. A complete 10 system review of systems are otherwise negative.  Past Medical History:  Diagnosis Date   ABLA (acute blood loss anemia) 07/04/2022   Achalasia    Acquired hypothyroidism 08/20/2020   Acute lower GI bleeding 11/02/2022   Acute metabolic encephalopathy 07/25/2021   AICD (automatic cardioverter/defibrillator) present 11/14/2019   Allergic rhinitis     Ankle fracture 03/25/2022   Aortic valve disorder 03/26/2002   Atherosclerosis of abdominal aorta 05/01/2020   Cardiomyopathy (HCC)    CHB (complete heart block) (HCC) 08/18/2017   CHF (congestive heart failure) (HCC)    Cholelithiasis without obstruction 05/01/2020   Chronic gouty arthritis    Chronic heart failure with preserved ejection fraction (HCC) 05/01/2020   Chronic kidney disease (CKD), active medical management without dialysis, stage 3 (moderate) (HCC) 05/01/2020   Closed torus fracture of distal end of right radius with delayed healing 08/12/2021   Delusional thoughts (HCC)    Diverticulosis of colon 05/01/2020   Epistaxis    Essential (primary) hypertension 08/18/2017   Gastroesophageal reflux disease 05/01/2020   Gastrointestinal hemorrhage    H/O transfusion of packed red blood cells    Hemorrhagic shock (HCC) 07/02/2022   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 11/14/2019   Hypercoagulability due to atrial fibrillation (HCC) 05/01/2020   Hyperlipidemia 11/14/2019   Hypocalcemia 12/14/2020   Hypoglycemia    Hypokalemia 12/14/2020   Hypomagnesemia 12/14/2020   Hypotension 12/14/2020   Idiopathic pulmonary fibrosis (HCC) 05/01/2020   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   Iron deficiency anemia    Long term (current) use of anticoagulants    Malnutrition of mild degree [name]: 75% to less than 90% of standard weight)    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/12/2021   Mitral and aortic incompetence 11/14/2019   Non-rheumatic atrial fibrillation (HCC) 05/01/2020   Non-toxic multinodular goiter 08/20/2020   NSVT (nonsustained ventricular tachycardia) (HCC) 08/18/2017   Oropharyngeal dysphagia    Osteoarthritis of hip 05/01/2020   Osteoarthritis of knee 05/01/2020   Osteopenia of neck of left femur    Pain of left hip joint 12/12/2017   Proteinuria 05/01/2020  Raynaud's disease    Recurrent falls 04/09/2021   Rheumatoid arthritis (HCC)    Secondary  adrenal insufficiency 05/01/2020   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   Seizures (HCC)    Septic shock (HCC) 03/10/2020   Slow transit constipation    Systemic lupus erythematosus (HCC) 05/01/2020   Thrombophilia    Transient ischemic attack    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   Vitamin D  deficiency    Past Surgical History:  Procedure Laterality Date   BREAST BIOPSY Left    CARDIAC VALVE SURGERY     CARPAL TUNNEL RELEASE     COLONOSCOPY N/A 04/26/2023   Procedure: COLONOSCOPY;  Surgeon: Burnette Fallow, MD;  Location: [facility] ENDOSCOPY;  Service: Gastroenterology;  Laterality: N/A;   COLONOSCOPY WITH PROPOFOL  N/A 12/20/2020   Procedure: COLONOSCOPY WITH PROPOFOL ;  Surgeon: Burnette Fallow, MD;  Location: [facility] ENDOSCOPY;  Service: Endoscopy;  Laterality: N/A;   HEMORRHOID SURGERY     PACEMAKER INSERTION     RIGHT/LEFT HEART CATH AND CORONARY ANGIOGRAPHY N/A 11/27/2020   Procedure: RIGHT/LEFT HEART CATH AND CORONARY ANGIOGRAPHY;  Surgeon: Cherrie Toribio SAUNDERS, MD;  Location: [facility] INVASIVE CV LAB;  Service: Cardiovascular;  Laterality: N/A;   TONSILLECTOMY     Social History:  reports that she has never smoked. She has been exposed to tobacco smoke. She has never used smokeless tobacco. She reports that she does not drink alcohol  and does not use drugs.  Allergies  Allergen Reactions   Diprivan  [Propofol ] Shortness Of Breath and Other (See Comments)    Caused asthma   Other Other (See Comments)    Patient is to NOT EAT any foods with husks or tree nuts, popcorn   Tambocor [Flecainide] Other (See Comments)    Unknown reaction   Norvasc [Amlodipine] Other (See Comments)    Fatigue Syncope   Pacerone [Amiodarone] Other (See Comments)    Delirium Confusion Psychosis    Family History  Problem Relation Age of Onset   Heart disease Mother    Hypertension Mother    Rheum arthritis Mother    Osteoarthritis Mother    Heart disease Father    Hypertension Father    Osteoarthritis  Father    Heart disease Sister    Diabetes Brother    Cancer Brother      Prior to Admission medications   Medication Sig Start Date End Date Taking? Authorizing Provider  acetaminophen  (TYLENOL ) 500 MG tablet Take 1,000 mg by mouth every 6 (six) hours as needed (pain).    [provider]  albuterol  (VENTOLIN  HFA) 108 (90 Base) MCG/ACT inhaler Inhale 2 puffs into the lungs every 6 (six) hours as needed for wheezing or shortness of breath. 03/21/23   Hunsucker, Donnice SAUNDERS, MD  allopurinol  (ZYLOPRIM ) 100 MG tablet TAKE 1 TABLET BY MOUTH EVERY DAY 08/10/23   Rice, Lonni ORN, MD  amoxicillin  (AMOXIL ) 500 MG capsule 4 capsules by mouth 1 hour before dental procedure. 12/30/21   [provider]  apixaban  (ELIQUIS ) 2.5 MG TABS tablet Take 1 tablet (2.5 mg total) by mouth 2 (two) times daily. 05/01/23   Ghimire, Donalda HERO, MD  Budeson-Glycopyrrol-Formoterol  (BREZTRI  AEROSPHERE) 160-9-4.8 MCG/ACT AERO Inhale 2 puffs into the lungs 2 (two) times daily as needed (for flares). 03/21/23   Hunsucker, Donnice SAUNDERS, MD  calcium -vitamin D  (OSCAL WITH D) 500-5 MG-MCG tablet Take 2 tablets by mouth 2 (two) times daily. 07/10/22   Elgergawy, Brayton RAMAN, MD  cetirizine  HCl (ZYRTEC ) 5 MG/5ML SOLN Take 1 mL (1 mg total) by  mouth 2 (two) times daily as needed for rhinitis or allergies. 04/21/23   Sebastian Toribio GAILS, MD  Cholecalciferol  (VITAMIN D3) 25 MCG (1000 UT) CAPS Take 1,000 Units by mouth daily with lunch.    [provider]  fluticasone  (FLONASE ) 50 MCG/ACT nasal spray Place 1 spray into both nostrils as needed for allergies or rhinitis.    [provider]  furosemide  (LASIX ) 40 MG tablet Take 1.5 tablets (60 mg total) by mouth daily. 10/26/23   Milford, Harlene HERO, FNP  levETIRAcetam  (KEPPRA ) 500 MG tablet Take 1 tablet (500 mg total) by mouth 2 (two) times daily. 06/07/23   Skeet, Adam R, DO  magnesium  oxide (MAG-OX) 400 (240 Mg) MG tablet TAKE 1 TABLET BY MOUTH EVERY DAY 07/05/23    Cindie Ole DASEN, MD  Nintedanib  (OFEV ) 150 MG CAPS Take 1 capsule (150 mg total) by mouth at bedtime. 11/01/23   Hunsucker, Donnice SAUNDERS, MD  OLANZapine  (ZYPREXA ) 2.5 MG tablet TAKE 1 TABLET BY MOUTH EVERYDAY AT BEDTIME 08/02/23   Jaffe, Adam R, DO  pantoprazole  (PROTONIX ) 40 MG tablet Take 1 tablet (40 mg total) by mouth 2 (two) times daily. 11/11/22   Regalado, Belkys A, MD  polyethylene glycol powder (MIRALAX ) 17 GM/SCOOP powder Take 17 g by mouth daily as needed for mild constipation. 07/03/21   [provider]  potassium chloride  (KLOR-CON  M) 10 MEQ tablet Take 2 tablets (20 mEq total) by mouth daily. Further refill requests for this medication need to be approved by PCP. 06/03/23   Aniceto Daphne CROME, NP  potassium chloride  (KLOR-CON ) 10 MEQ tablet Take 4 tablets (40 mEq total) by mouth daily. 10/12/23 01/10/24  Glena Harlene HERO, FNP  predniSONE  (DELTASONE ) 5 MG tablet Take 1 tablet (5 mg total) by mouth daily with breakfast. Patient to double up dose during sick day rule 11/30/23   Shamleffer, Donell Cardinal, MD    Physical Exam: BP (!) 145/74 (BP Location: Right Arm)   Pulse 69   Temp 98.4 F (36.9 C) (Oral)   Resp 16   Ht 5' 3 (1.6 m)   Wt 49.9 kg   SpO2 100%   BMI 19.49 kg/m  General: Pleasant, weak appearing elderly woman laying in bed. No acute distress. HEENT: [place]/AT. Anicteric sclera CV: Regular rate. Paced rhythm. Harsh mechanical lung sounds. No LE edema Pulmonary: Lungs CTAB. Normal effort. No wheezing or rales. Abdominal: Soft, nontender, nondistended. Normal bowel sounds. Extremities: Palpable radial and DP pulses. Normal ROM. Skin: Warm and dry. No obvious rash or lesions. Neuro: A&Ox3. Moves all extremities. Normal sensation to light touch. No focal deficit. Psych: Normal mood and affect          Labs on Admission:  Basic Metabolic Panel: Recent Labs  Lab 12/18/23 1852  NA 137  K 4.1  CL 101  CO2 20*  GLUCOSE 76  BUN 40*  CREATININE 1.65*  CALCIUM  9.0    Liver Function Tests: Recent Labs  Lab 12/18/23 1852  AST 39  ALT 24  ALKPHOS 54  BILITOT 1.1  PROT 7.4  ALBUMIN 3.4*   No results for input(s): LIPASE, AMYLASE in the last 168 hours. No results for input(s): AMMONIA in the last 168 hours. CBC: Recent Labs  Lab 12/18/23 1852  WBC 8.4  HGB 10.0*  HCT 31.9*  MCV 97.3  PLT 168   Cardiac Enzymes: No results for input(s): CKTOTAL, CKMB, CKMBINDEX, TROPONINI in the last 168 hours. BNP (last 3 results) Recent Labs    06/10/23  9070 10/12/23 1618  BNP 511.4* 330.7*    ProBNP (last 3 results) No results for input(s): PROBNP in the last 8760 hours.  CBG: No results for input(s): GLUCAP in the last 168 hours.  Radiological Exams on Admission: No results found. Assessment/Plan Lindsey Huber is a 78 y.o. female with medical history significant for HTN, HLD, A-fib on Eliquis , CHB s/p PPM, GERD, CKD 3, hypothyroidism, chronic diastolic CHF, ischemic cardiomyopathy s/p AICD, aortic valve replacement, mitral valve replacement, tricuspid valve replacement, mild dementia, seizure, asthma, acquired hypothyroidism, secondary adrenal insufficiency, pulmonary fibrosis, RA, Raynaud's, lupus, gout, seizure, anemia, diverticulosis and recurrent GI bleeds presented to the ED for evaluation of rectal bleeding and admitted for GI bleed.  # GI bleed, recurrent - Patient with a recurrent GI bleed on Eliquis  presented with hematochezia and melena - Hgb remains stable at 10.0, baseline 9-10 - FOBT positive, Pt hemodynamically stable - Colonoscopy in March 2025 showed diverticulosis in the sigmoid and descending colon but no evidence of active bleeding - GI bleed likely due to diverticular bleed - [facility] consulted, appreciate further recs - Patient has already taken her 2 doses of Eliquis  today, will hold home Eliquis  - Due to her recurrent GI bleeds, I discussed with patient and her daughter there would likely need to be  a decision made during this hospitalization between GI, cardiology and pateint concerning the risk and benefits of continuing anticoagulation - Trend CBC and Transfuse for Hgb goal > 7  # Chronic diastolic HF # S/p bioprosthetic AVR, MVR and TVR - Last TTE in October 2024 showed EF 60 to 65%, moderate LVH, moderately reduced RV SF, severely dilated LA and RA, moderate TVR, and moderate to severe tricuspid stenosis - Patient currently pending repeat TTE in mid December - Patient slightly dry on exam, will hold Lasix  today, resume when appropriate  #CHB s/p PPM # A-fib - Patient with ventricularly paced rhythm - Hold Eliquis  in the setting of GI bleed  # HTN - BP stable with SBP in the 110-140s - CTM  # Rheumatoid arthritis # Raynaud's syndrome # Lupus - Continue prednisone   # Acquired hypothyroidism - Recently seen by endocrinology, have been taking off Synthroid  - Thyroid  ultrasound on 12/08/2023 showed slightly increased size of the inferior left thyroid  lobe measuring 2.1 cm from 1.8 cm - TSH and free T4 within normal limits 2 weeks ago  # Secondary adrenal insufficiency - Per endocrinology notes, this is due to chronic use of glucocorticoids for her rheumatoid arthritis - Continue prednisone   # Recurrent frontal sinusitis - Diagnosed by PCP 2 days ago - Continue course of amoxicillin   # Seizure disorder - Continue Keppra   # Pulmonary fibrosis - Continue nintedanib   # Asthma - Continue home bronchodilator  # Gout - Continue allopurinol   # Mild dementia - Continue Zyprexa  at bedtime  # GERD - Continue PPI  DVT prophylaxis: SCDs    Code Status: Full Code  Consults called: GI  Family Communication: Discussed results/findings and admission plan with daughter at bedside  Severity of Illness: The appropriate patient status for this patient is INPATIENT. Inpatient status is judged to be reasonable and necessary in order to provide the required intensity of  service to ensure the patient's safety. The patient's presenting symptoms, physical exam findings, and initial radiographic and laboratory data in the context of their chronic comorbidities is felt to place them at high risk for further clinical deterioration. Furthermore, it is not anticipated that the patient will be medically stable  for discharge from the hospital within 2 midnights of admission.   * I certify that at the point of admission it is my clinical judgment that the patient will require inpatient hospital care spanning beyond 2 midnights from the point of admission due to high intensity of service, high risk for further deterioration and high frequency of surveillance required.*  Level of care: Telemetry   I personally spent a total of 75 minutes in the care of the patient today including preparing to see the patient, getting/reviewing separately obtained history, performing a medically appropriate exam/evaluation, placing orders, documenting clinical information in the EHR, and communicating results.  Lou Claretta HERO, MD 12/18/2023, 10:19 PM [facility] Pager: [phone] Isaiah 41:10   If 7PM-7AM, please contact night-coverage [email or web address] Password [email or web address]

## 2023-12-18 NOTE — ED Notes (Signed)
 Patient was able to ambulate to restroom with one person assist. Patient had a shuffling gait but denied any lightheadedness or dizziness.

## 2023-12-18 NOTE — ED Provider Notes (Signed)
 [place] EMERGENCY DEPARTMENT AT [facility] Provider Note   CSN: [id number] Arrival date & time: 12/18/23  1739     Patient presents with: Rectal Bleeding   Lindsey Huber is a 78 y.o. female.   Patient with history of cardiomyopathy, CHF, HTN, atrial fibrillation on Eliquis , TIA, idiopathic pulmonary fibrosis, GI bleed, SLE, Rheumatoid arthritis, AICD, seizures presents with visualized blood that was bright red last week. Over the last 1-2 days, this has progressed to dark blood. She denies significant abdominal pain, no nausea, vomiting. She is not lightheaded or dizzy.   The history is provided by the patient. No language interpreter was used.  Rectal Bleeding      Prior to Admission medications   Medication Sig Start Date End Date Taking? Authorizing Provider  acetaminophen  (TYLENOL ) 500 MG tablet Take 1,000 mg by mouth every 6 (six) hours as needed (pain).    [provider]  albuterol  (VENTOLIN  HFA) 108 (90 Base) MCG/ACT inhaler Inhale 2 puffs into the lungs every 6 (six) hours as needed for wheezing or shortness of breath. 03/21/23   Hunsucker, Donnice SAUNDERS, MD  allopurinol  (ZYLOPRIM ) 100 MG tablet TAKE 1 TABLET BY MOUTH EVERY DAY 08/10/23   Rice, Lonni ORN, MD  amoxicillin  (AMOXIL ) 500 MG capsule 4 capsules by mouth 1 hour before dental procedure. 12/30/21   [provider]  apixaban  (ELIQUIS ) 2.5 MG TABS tablet Take 1 tablet (2.5 mg total) by mouth 2 (two) times daily. 05/01/23   Ghimire, Donalda HERO, MD  Budeson-Glycopyrrol-Formoterol  (BREZTRI  AEROSPHERE) 160-9-4.8 MCG/ACT AERO Inhale 2 puffs into the lungs 2 (two) times daily as needed (for flares). 03/21/23   Hunsucker, Donnice SAUNDERS, MD  calcium -vitamin D  (OSCAL WITH D) 500-5 MG-MCG tablet Take 2 tablets by mouth 2 (two) times daily. 07/10/22   Elgergawy, Brayton RAMAN, MD  cetirizine  HCl (ZYRTEC ) 5 MG/5ML SOLN Take 1 mL (1 mg total) by mouth 2 (two) times daily as needed for rhinitis or allergies.  04/21/23   Sebastian Toribio GAILS, MD  Cholecalciferol  (VITAMIN D3) 25 MCG (1000 UT) CAPS Take 1,000 Units by mouth daily with lunch.    [provider]  fluticasone  (FLONASE ) 50 MCG/ACT nasal spray Place 1 spray into both nostrils as needed for allergies or rhinitis.    [provider]  furosemide  (LASIX ) 40 MG tablet Take 1.5 tablets (60 mg total) by mouth daily. 10/26/23   Milford, Harlene HERO, FNP  levETIRAcetam  (KEPPRA ) 500 MG tablet Take 1 tablet (500 mg total) by mouth 2 (two) times daily. 06/07/23   Skeet, Adam R, DO  magnesium  oxide (MAG-OX) 400 (240 Mg) MG tablet TAKE 1 TABLET BY MOUTH EVERY DAY 07/05/23   Cindie Ole DASEN, MD  Nintedanib  (OFEV ) 150 MG CAPS Take 1 capsule (150 mg total) by mouth at bedtime. 11/01/23   Hunsucker, Donnice SAUNDERS, MD  OLANZapine  (ZYPREXA ) 2.5 MG tablet TAKE 1 TABLET BY MOUTH EVERYDAY AT BEDTIME 08/02/23   Jaffe, Adam R, DO  pantoprazole  (PROTONIX ) 40 MG tablet Take 1 tablet (40 mg total) by mouth 2 (two) times daily. 11/11/22   Regalado, Belkys A, MD  polyethylene glycol powder (MIRALAX ) 17 GM/SCOOP powder Take 17 g by mouth daily as needed for mild constipation. 07/03/21   [provider]  potassium chloride  (KLOR-CON  M) 10 MEQ tablet Take 2 tablets (20 mEq total) by mouth daily. Further refill requests for this medication need to be approved by PCP. 06/03/23   Aniceto Daphne CROME, NP  potassium chloride  (KLOR-CON ) 10 MEQ  tablet Take 4 tablets (40 mEq total) by mouth daily. 10/12/23 01/10/24  Glena Harlene HERO, FNP  predniSONE  (DELTASONE ) 5 MG tablet Take 1 tablet (5 mg total) by mouth daily with breakfast. Patient to double up dose during sick day rule 11/30/23   Shamleffer, Ibtehal Jaralla, MD    Allergies: Diprivan  [propofol ], Other, Tambocor [flecainide], Norvasc [amlodipine], and Pacerone [amiodarone]    Review of Systems  Gastrointestinal:  Positive for hematochezia.    Updated Vital Signs BP (!) 145/74 (BP Location: Right Arm)   Pulse 69    Temp 98.4 F (36.9 C) (Oral)   Resp 16   Ht 5' 3 (1.6 m)   Wt 49.9 kg   SpO2 100%   BMI 19.49 kg/m   Physical Exam Vitals and nursing note reviewed.  Constitutional:      Appearance: She is well-developed.     Comments: Frail, elderly patient who is alert and in NAD.  HENT:     Head: Normocephalic.  Eyes:     Conjunctiva/sclera: Conjunctivae normal.  Cardiovascular:     Rate and Rhythm: Normal rate.  Pulmonary:     Effort: Pulmonary effort is normal.     Breath sounds: No wheezing.  Abdominal:     General: There is no distension.     Palpations: Abdomen is soft.     Tenderness: There is no abdominal tenderness. There is no guarding or rebound.  Genitourinary:    Rectum: Guaiac result positive.     Comments: Rectal exam showing dark red blood present without stool.  Musculoskeletal:        General: Normal range of motion.     Cervical back: Normal range of motion and neck supple.  Skin:    General: Skin is warm and dry.  Neurological:     General: No focal deficit present.     Mental Status: She is alert and oriented to person, place, and time.     (all labs ordered are listed, but only abnormal results are displayed) Labs Reviewed  COMPREHENSIVE METABOLIC PANEL WITH GFR - Abnormal; Notable for the following components:      Result Value   CO2 20 (*)    BUN 40 (*)    Creatinine, Ser 1.65 (*)    Albumin 3.4 (*)    GFR, Estimated 32 (*)    Anion gap 16 (*)    All other components within normal limits  CBC - Abnormal; Notable for the following components:   RBC 3.28 (*)    Hemoglobin 10.0 (*)    HCT 31.9 (*)    RDW 17.0 (*)    All other components within normal limits  PROTIME-INR - Abnormal; Notable for the following components:   Prothrombin  Time 17.8 (*)    INR 1.4 (*)    All other components within normal limits  POC OCCULT BLOOD, ED - Abnormal; Notable for the following components:   Fecal Occult Bld POSITIVE (*)    All other components within normal  limits  TYPE AND SCREEN    EKG: None  Radiology: No results found.   Procedures   Medications Ordered in the ED - No data to display  Clinical Course as of 12/18/23 2045  [place] Dec 18, 2023  2042 Patient to ED with rectal bleeding, BRB last week to dark blood x 1-2 days. No pain. Hemodynamically stable. Her last admission for GI bleeding was April of this year, Dr. Burnette performing colonoscopy. [facility] ([place]) consulted for a.m. evaluation. Discussed  with hospitalist service, Dr. Lou who accepted the patient fro admission.  [SU]    Clinical Course User Index [SU] Odell Balls, PA-C                                 Medical Decision Making Amount and/or Complexity of Data Reviewed Labs: ordered.        Final diagnoses:  Acute GI bleeding  Coagulopathy    ED Discharge Orders     None          Odell Balls RIGGERS 12/18/23 2045    Ula Prentice SAUNDERS, MD 12/18/23 2218

## 2023-12-18 NOTE — ED Notes (Signed)
 Called CCMD to place pt on monitor

## 2023-12-19 DIAGNOSIS — K922 Gastrointestinal hemorrhage, unspecified: Secondary | ICD-10-CM | POA: Diagnosis not present

## 2023-12-19 LAB — BASIC METABOLIC PANEL WITH GFR
Anion gap: 15 (ref 5–15)
BUN: 47 mg/dL — ABNORMAL HIGH (ref 8–23)
CO2: 24 mmol/L (ref 22–32)
Calcium: 9.2 mg/dL (ref 8.9–10.3)
Chloride: 97 mmol/L — ABNORMAL LOW (ref 98–111)
Creatinine, Ser: 1.45 mg/dL — ABNORMAL HIGH (ref 0.44–1.00)
GFR, Estimated: 37 mL/min — ABNORMAL LOW (ref >60)
Glucose, Bld: 53 mg/dL — ABNORMAL LOW (ref 70–99)
Potassium: 4.1 mmol/L (ref 3.5–5.1)
Sodium: 136 mmol/L (ref 135–145)

## 2023-12-19 LAB — GLUCOSE, CAPILLARY
Glucose-Capillary: 101 mg/dL — ABNORMAL HIGH (ref 70–99)
Glucose-Capillary: 77 mg/dL (ref 70–99)

## 2023-12-19 LAB — CBC
HCT: 28.5 % — ABNORMAL LOW (ref 36.0–46.0)
Hemoglobin: 9.3 g/dL — ABNORMAL LOW (ref 12.0–15.0)
MCH: 30.8 pg (ref 26.0–34.0)
MCHC: 32.6 g/dL (ref 30.0–36.0)
MCV: 94.4 fL (ref 80.0–100.0)
Platelets: 155 K/uL (ref 150–400)
RBC: 3.02 MIL/uL — ABNORMAL LOW (ref 3.87–5.11)
RDW: 16.8 % — ABNORMAL HIGH (ref 11.5–15.5)
WBC: 7 K/uL (ref 4.0–10.5)
nRBC: 0 % (ref 0.0–0.2)

## 2023-12-19 LAB — CBG MONITORING, ED
Glucose-Capillary: 52 mg/dL — ABNORMAL LOW (ref 70–99)
Glucose-Capillary: 126 mg/dL — ABNORMAL HIGH (ref 70–99)
Glucose-Capillary: 64 mg/dL — ABNORMAL LOW (ref 70–99)

## 2023-12-19 MED ORDER — DEXTROSE 50 % IV SOLN: 25.0000 g | INTRAVENOUS | Status: AC

## 2023-12-19 MED ORDER — DEXTROSE 5 % IV SOLN: INTRAVENOUS | Status: DC

## 2023-12-19 MED ORDER — DEXTROSE 50 % IV SOLN
INTRAVENOUS | Status: AC
  Administered 2023-12-19: 25 g via INTRAVENOUS
  Filled 2023-12-19: qty 50

## 2023-12-19 MED ORDER — DEXTROSE 5 % IV SOLN: Freq: Once | INTRAVENOUS | Status: AC

## 2023-12-19 MED ORDER — AMOXICILLIN 500 MG PO CAPS
500.0000 mg | ORAL_CAPSULE | Freq: Two times a day (BID) | ORAL | Status: DC
  Administered 2023-12-19 – 2023-12-20 (×2): 500 mg via ORAL
  Filled 2023-12-19 (×3): qty 1

## 2023-12-19 MED ORDER — SODIUM CHLORIDE 0.9 % IV SOLN: INTRAVENOUS | Status: DC

## 2023-12-19 NOTE — Plan of Care (Signed)

## 2023-12-19 NOTE — Progress Notes (Signed)
 [facility] Brief Progress Note   _____________________________________________________________________________________________________________  Patient Name: Lindsey Huber Patient DOB: 30-Dec-1945 Date: @TODAY @      Data: 78 yo female, admitted with GI bleed, GI consulted.     Action: No action at this time.      Response:    _____________________________________________________________________________________________________________  [facility] RN Expeditor Sharolyn JONETTA Batman Please contact us  directly via secure chat (search for [facility]) or by calling us  at [phone] [facility]).

## 2023-12-19 NOTE — Consult Note (Signed)
 Referring Provider: Dr. Leotis Primary Care Physician:  [name], [name], PA Primary Gastroenterologist:  Dr. Burnette  Reason for Consultation:  GI Bleed  HPI: Lindsey Huber is a 78 y.o. female with multiple medical problems as stated below on Eliquis  presented with acute onset of rectal bleeding reporting that had one episode of red blood and one episode of black stools yesterday. Felt weak and short of breath. Reports clear colored vomiting one time over the weekend. Colonoscopy in 4/25 (Dr. Burnette) during admission for a GI bleed showed left-sided diverticulosis and hemorrhoids. Last dose of Eliquis  was yesterday. Hgb 9.3.  Past Medical History:  Diagnosis Date   ABLA (acute blood loss anemia) 07/04/2022   Achalasia    Acquired hypothyroidism 08/20/2020   Acute lower GI bleeding 11/02/2022   Acute metabolic encephalopathy 07/25/2021   AICD (automatic cardioverter/defibrillator) present 11/14/2019   Allergic rhinitis    Ankle fracture 03/25/2022   Aortic valve disorder 03/26/2002   Atherosclerosis of abdominal aorta 05/01/2020   Cardiomyopathy (HCC)    CHB (complete heart block) (HCC) 08/18/2017   CHF (congestive heart failure) (HCC)    Cholelithiasis without obstruction 05/01/2020   Chronic gouty arthritis    Chronic heart failure with preserved ejection fraction (HCC) 05/01/2020   Chronic kidney disease (CKD), active medical management without dialysis, stage 3 (moderate) (HCC) 05/01/2020   Closed torus fracture of distal end of right radius with delayed healing 08/12/2021   Delusional thoughts (HCC)    Diverticulosis of colon 05/01/2020   Epistaxis    Essential (primary) hypertension 08/18/2017   Gastroesophageal reflux disease 05/01/2020   Gastrointestinal hemorrhage    H/O transfusion of packed red blood cells    Hemorrhagic shock (HCC) 07/02/2022   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 11/14/2019   Hypercoagulability due to atrial  fibrillation (HCC) 05/01/2020   Hyperlipidemia 11/14/2019   Hypocalcemia 12/14/2020   Hypoglycemia    Hypokalemia 12/14/2020   Hypomagnesemia 12/14/2020   Hypotension 12/14/2020   Idiopathic pulmonary fibrosis (HCC) 05/01/2020   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   Iron deficiency anemia    Long term (current) use of anticoagulants    Malnutrition of mild degree [name]: 75% to less than 90% of standard weight)    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/12/2021   Mitral and aortic incompetence 11/14/2019   Non-rheumatic atrial fibrillation (HCC) 05/01/2020   Non-toxic multinodular goiter 08/20/2020   NSVT (nonsustained ventricular tachycardia) (HCC) 08/18/2017   Oropharyngeal dysphagia    Osteoarthritis of hip 05/01/2020   Osteoarthritis of knee 05/01/2020   Osteopenia of neck of left femur    Pain of left hip joint 12/12/2017   Proteinuria 05/01/2020   Raynaud's disease    Recurrent falls 04/09/2021   Rheumatoid arthritis (HCC)    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   Seizures (HCC)    Septic shock (HCC) 03/10/2020   Slow transit constipation    Systemic lupus erythematosus (HCC) 05/01/2020   Thrombophilia    Transient ischemic attack    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   Vitamin D  deficiency     Past Surgical History:  Procedure Laterality Date   BREAST BIOPSY Left    CARDIAC VALVE SURGERY     CARPAL TUNNEL RELEASE     COLONOSCOPY N/A 04/26/2023   Procedure: COLONOSCOPY;  Surgeon: Burnette Fallow, MD;  Location: [facility] ENDOSCOPY;  Service: Gastroenterology;  Laterality: N/A;   COLONOSCOPY WITH PROPOFOL  N/A 12/20/2020   Procedure: COLONOSCOPY WITH PROPOFOL ;  Surgeon: Burnette Fallow, MD;  Location: [facility] ENDOSCOPY;  Service: Endoscopy;  Laterality: N/A;   HEMORRHOID SURGERY     PACEMAKER INSERTION     RIGHT/LEFT HEART CATH AND CORONARY ANGIOGRAPHY N/A 11/27/2020   Procedure: RIGHT/LEFT HEART CATH AND CORONARY ANGIOGRAPHY;  Surgeon: Cherrie Toribio SAUNDERS, MD;  Location: [facility] INVASIVE CV LAB;  Service: Cardiovascular;  Laterality: N/A;   TONSILLECTOMY      Prior to Admission medications   Medication Sig Start Date End Date Taking? Authorizing Provider  acetaminophen  (TYLENOL ) 500 MG tablet Take 1,000 mg by mouth every 6 (six) hours as needed (pain).   Yes [provider]  albuterol  (VENTOLIN  HFA) 108 (90 Base) MCG/ACT inhaler Inhale 2 puffs into the lungs every 6 (six) hours as needed for wheezing or shortness of breath. 03/21/23  Yes Hunsucker, Donnice SAUNDERS, MD  allopurinol  (ZYLOPRIM ) 100 MG tablet TAKE 1 TABLET BY MOUTH EVERY DAY 08/10/23  Yes Rice, Lonni ORN, MD  amoxicillin  (AMOXIL ) 875 MG tablet Take 875 mg by mouth 2 (two) times daily.   Yes [provider]  apixaban  (ELIQUIS ) 2.5 MG TABS tablet Take 1 tablet (2.5 mg total) by mouth 2 (two) times daily. Patient taking differently: Take 2.5 mg by mouth 2 (two) times daily. 6am, & 4 pm 05/01/23  Yes Ghimire, Donalda HERO, MD  Budeson-Glycopyrrol-Formoterol  (BREZTRI  AEROSPHERE) 160-9-4.8 MCG/ACT AERO Inhale 2 puffs into the lungs 2 (two) times daily as needed (for flares). 03/21/23  Yes Hunsucker, Donnice SAUNDERS, MD  calcium -vitamin D  (OSCAL WITH D) 500-5 MG-MCG tablet Take 2 tablets by mouth 2 (two) times daily. 07/10/22  Yes Elgergawy, Brayton RAMAN, MD  cetirizine  HCl (ZYRTEC ) 5 MG/5ML SOLN Take 1 mL (1 mg total) by mouth 2 (two) times daily as needed for rhinitis or allergies. 04/21/23  Yes Sebastian Toribio GAILS, MD  Cholecalciferol  (VITAMIN D3) 25 MCG (1000 UT) CAPS Take 1,000 Units by mouth daily with lunch.   Yes [provider]  fluticasone  (FLONASE ) 50 MCG/ACT nasal spray Place 1 spray into both nostrils as needed for allergies or rhinitis.   Yes [provider]  furosemide  (LASIX ) 20 MG tablet Take 20 mg by mouth daily.   Yes [provider]  furosemide  (LASIX ) 40 MG tablet Take 1.5 tablets (60 mg total) by mouth daily. Patient taking differently: Take 40 mg by  mouth daily. 10/26/23  Yes Milford, Harlene HERO, FNP  levETIRAcetam  (KEPPRA ) 500 MG tablet Take 1 tablet (500 mg total) by mouth 2 (two) times daily. 06/07/23  Yes Jaffe, Adam R, DO  magnesium  oxide (MAG-OX) 400 (240 Mg) MG tablet TAKE 1 TABLET BY MOUTH EVERY DAY 07/05/23  Yes Cindie Ole DASEN, MD  Nintedanib  (OFEV ) 150 MG CAPS Take 1 capsule (150 mg total) by mouth at bedtime. 11/01/23  Yes Hunsucker, Donnice SAUNDERS, MD  OLANZapine  (ZYPREXA ) 2.5 MG tablet TAKE 1 TABLET BY MOUTH EVERYDAY AT BEDTIME 08/02/23  Yes Jaffe, Adam R, DO  pantoprazole  (PROTONIX ) 40 MG tablet Take 1 tablet (40 mg total) by mouth 2 (two) times daily. 11/11/22  Yes Regalado, Belkys A, MD  polyethylene glycol powder (MIRALAX ) 17 GM/SCOOP powder Take 17 g by mouth daily as needed for mild constipation. 07/03/21  Yes [provider]  potassium chloride  (KLOR-CON  M) 10 MEQ tablet Take 2 tablets (20 mEq total) by mouth daily. Further refill requests for this medication need to be approved by PCP. Patient taking differently: Take 20 mEq by mouth 2 (two) times daily. Further refill requests for this medication need to be approved by  PCP. 06/03/23  Yes Aniceto Daphne CROME, NP  predniSONE  (DELTASONE ) 5 MG tablet Take 1 tablet (5 mg total) by mouth daily with breakfast. Patient to double up dose during sick day rule 11/30/23  Yes Shamleffer, Ibtehal Jaralla, MD  amoxicillin  (AMOXIL ) 500 MG capsule 4 capsules by mouth 1 hour before dental procedure. Patient not taking: Reported on 12/18/2023 12/30/21   [provider]    Scheduled Meds:  allopurinol   100 mg Oral Daily   amoxicillin   500 mg Oral TID   budesonide -glycopyrrolate -formoterol   2 puff Inhalation BID   calcium -vitamin D   2 tablet Oral BID   cholecalciferol   1,000 Units Oral Q lunch   levETIRAcetam   500 mg Oral BID   magnesium  oxide  400 mg Oral Daily   Nintedanib   150 mg Oral QHS   OLANZapine   2.5 mg Oral QHS   pantoprazole   40 mg Oral BID   potassium chloride   20 mEq Oral  BID   predniSONE   5 mg Oral Q breakfast   Continuous Infusions:  dextrose  50 mL/hr at 12/19/23 1149   PRN Meds:.acetaminophen  **OR** acetaminophen , albuterol , bisacodyl , ondansetron  **OR** ondansetron  (ZOFRAN ) IV, senna-docusate  Allergies as of 12/18/2023 - Review Complete 12/18/2023  Allergen Reaction Noted   Diprivan  [propofol ] Shortness Of Breath and Other (See Comments) 02/06/2007   Other Other (See Comments) 12/26/2021   Tambocor [flecainide] Other (See Comments) 06/19/2020   Norvasc [amlodipine] Other (See Comments) 09/01/2016   Pacerone [amiodarone] Other (See Comments) 09/03/2015    Family History  Problem Relation Age of Onset   Heart disease Mother    Hypertension Mother    Rheum arthritis Mother    Osteoarthritis Mother    Heart disease Father    Hypertension Father    Osteoarthritis Father    Heart disease Sister    Diabetes Brother    Cancer Brother     Social History   Socioeconomic History   Marital status: Legally Separated    Spouse name: Not on file   Number of children: Not on file   Years of education: 16   Highest education level: Bachelor's degree (e.g., BA, AB, BS)  Occupational History   Occupation: 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  Tobacco Use   Smoking status: Never    Passive exposure: Past   Smokeless tobacco: Never  Vaping Use   Vaping status: Never Used  Substance and Sexual Activity   Alcohol  use: Never   Drug use: Never   Sexual activity: Not Currently  Other Topics Concern   Not on file  Social History Narrative   Right Handed    Social Drivers of Health   Financial Resource Strain: Not on file  Food Insecurity: No Food Insecurity (12/19/2023)   Hunger Vital Sign    Worried About Running Out of Food in the Last Year: Never true    Ran Out of Food in the Last Year: Never true  Transportation Needs: No Transportation Needs (12/19/2023)   PRAPARE - Administrator, Civil Service (Medical): No    Lack of  Transportation (Non-Medical): No  Physical Activity: Not on file  Stress: Not on file  Social Connections: Socially Isolated (12/19/2023)   Social Connection and Isolation Panel    Frequency of Communication with Friends and Family: More than three times a week    Frequency of Social Gatherings with Friends and Family: More than three times a week    Attends Religious Services: Never    Active Member  of Clubs or Organizations: No    Attends Banker Meetings: Never    Marital Status: Separated  Intimate Partner Violence: Not At Risk (12/19/2023)   Humiliation, Afraid, Rape, and Kick questionnaire    Fear of Current or Ex-Partner: No    Emotionally Abused: No    Physically Abused: No    Sexually Abused: No    Review of Systems: All negative except as stated above in HPI.  Physical Exam: Vital signs: Vitals:   12/19/23 1347 12/19/23 1402  BP: 121/71 (!) 155/79  Pulse: 70 76  Resp: (!) 23 16  Temp:  (!) 97.5 F (36.4 C)  SpO2: 100%    Last BM Date : 12/19/23 General:  lethargic, elderly, thin, no acute distress, pleasant   Head: normocephalic, atraumatic Eyes: anicteric sclera ENT: oropharynx clear Neck: supple, nontender Lungs:  Clear throughout to auscultation.   No wheezes, crackles, or rhonchi. No acute distress. Heart:  Regular rate and rhythm; no murmurs, clicks, rubs,  or gallops. Abdomen: soft, nontender, nondistended, +BS  Rectal:  Deferred Ext: no edema  GI:  Lab Results: Recent Labs    12/18/23 1852 12/19/23 0452  WBC 8.4 7.0  HGB 10.0* 9.3*  HCT 31.9* 28.5*  PLT 168 155   BMET Recent Labs    12/18/23 1852 12/19/23 0452  NA 137 136  K 4.1 4.1  CL 101 97*  CO2 20* 24  GLUCOSE 76 53*  BUN 40* 47*  CREATININE 1.65* 1.45*  CALCIUM  9.0 9.2   LFT Recent Labs    12/18/23 1852  PROT 7.4  ALBUMIN 3.4*  AST 39  ALT 24  ALKPHOS 54  BILITOT 1.1   PT/INR Recent Labs    12/18/23 1945  LABPROT 17.8*  INR 1.4*      Studies/Results: No results found.  Impression/Plan: GI bleed with red blood and black stools yesterday on Eliquis . Likely lower GI bleed source but needs an EGD to check for ulcers prior to resuming anticoagulation. Hold Eliquis . Clear liquid diet. NPO p MN. Supportive care.    LOS: 1 day   Jerrell JAYSON Sol  12/19/2023, 3:29 PM  Questions please call [phone]

## 2023-12-19 NOTE — ED Notes (Signed)
 Pt assist to restroom. Pt found incontinent . Cleaned pt, placed in gown changed sheets and warrm blankets x2 . Pt clean and dry, skin is intact.

## 2023-12-19 NOTE — H&P (View-Only) (Signed)
 Referring Provider: Dr. Leotis Primary Care Physician:  [name], [name], PA Primary Gastroenterologist:  Dr. Burnette  Reason for Consultation:  GI Bleed  HPI: Lindsey Huber is a 78 y.o. female with multiple medical problems as stated below on Eliquis  presented with acute onset of rectal bleeding reporting that had one episode of red blood and one episode of black stools yesterday. Felt weak and short of breath. Reports clear colored vomiting one time over the weekend. Colonoscopy in 4/25 (Dr. Burnette) during admission for a GI bleed showed left-sided diverticulosis and hemorrhoids. Last dose of Eliquis  was yesterday. Hgb 9.3.  Past Medical History:  Diagnosis Date   ABLA (acute blood loss anemia) 07/04/2022   Achalasia    Acquired hypothyroidism 08/20/2020   Acute lower GI bleeding 11/02/2022   Acute metabolic encephalopathy 07/25/2021   AICD (automatic cardioverter/defibrillator) present 11/14/2019   Allergic rhinitis    Ankle fracture 03/25/2022   Aortic valve disorder 03/26/2002   Atherosclerosis of abdominal aorta 05/01/2020   Cardiomyopathy (HCC)    CHB (complete heart block) (HCC) 08/18/2017   CHF (congestive heart failure) (HCC)    Cholelithiasis without obstruction 05/01/2020   Chronic gouty arthritis    Chronic heart failure with preserved ejection fraction (HCC) 05/01/2020   Chronic kidney disease (CKD), active medical management without dialysis, stage 3 (moderate) (HCC) 05/01/2020   Closed torus fracture of distal end of right radius with delayed healing 08/12/2021   Delusional thoughts (HCC)    Diverticulosis of colon 05/01/2020   Epistaxis    Essential (primary) hypertension 08/18/2017   Gastroesophageal reflux disease 05/01/2020   Gastrointestinal hemorrhage    H/O transfusion of packed red blood cells    Hemorrhagic shock (HCC) 07/02/2022   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 11/14/2019   Hypercoagulability due to atrial  fibrillation (HCC) 05/01/2020   Hyperlipidemia 11/14/2019   Hypocalcemia 12/14/2020   Hypoglycemia    Hypokalemia 12/14/2020   Hypomagnesemia 12/14/2020   Hypotension 12/14/2020   Idiopathic pulmonary fibrosis (HCC) 05/01/2020   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   Iron deficiency anemia    Long term (current) use of anticoagulants    Malnutrition of mild degree [name]: 75% to less than 90% of standard weight)    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/12/2021   Mitral and aortic incompetence 11/14/2019   Non-rheumatic atrial fibrillation (HCC) 05/01/2020   Non-toxic multinodular goiter 08/20/2020   NSVT (nonsustained ventricular tachycardia) (HCC) 08/18/2017   Oropharyngeal dysphagia    Osteoarthritis of hip 05/01/2020   Osteoarthritis of knee 05/01/2020   Osteopenia of neck of left femur    Pain of left hip joint 12/12/2017   Proteinuria 05/01/2020   Raynaud's disease    Recurrent falls 04/09/2021   Rheumatoid arthritis (HCC)    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   Seizures (HCC)    Septic shock (HCC) 03/10/2020   Slow transit constipation    Systemic lupus erythematosus (HCC) 05/01/2020   Thrombophilia    Transient ischemic attack    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   Vitamin D  deficiency     Past Surgical History:  Procedure Laterality Date   BREAST BIOPSY Left    CARDIAC VALVE SURGERY     CARPAL TUNNEL RELEASE     COLONOSCOPY N/A 04/26/2023   Procedure: COLONOSCOPY;  Surgeon: Burnette Fallow, MD;  Location: [facility] ENDOSCOPY;  Service: Gastroenterology;  Laterality: N/A;   COLONOSCOPY WITH PROPOFOL  N/A 12/20/2020   Procedure: COLONOSCOPY WITH PROPOFOL ;  Surgeon: Burnette Fallow, MD;  Location: [facility] ENDOSCOPY;  Service: Endoscopy;  Laterality: N/A;   HEMORRHOID SURGERY     PACEMAKER INSERTION     RIGHT/LEFT HEART CATH AND CORONARY ANGIOGRAPHY N/A 11/27/2020   Procedure: RIGHT/LEFT HEART CATH AND CORONARY ANGIOGRAPHY;  Surgeon: Cherrie Toribio SAUNDERS, MD;  Location: [facility] INVAS[facility] CV LAB;  Service: Cardiovascular;  Laterality: N/A;   TONSILLECTOMY      Prior to Admission medications   Medication Sig Start Date End Date Taking? Authorizing Provider  acetaminophen  (TYLENOL ) 500 MG tablet Take 1,000 mg by mouth every 6 (six) hours as needed (pain).   Yes [provider]  albuterol  (VENTOLIN  HFA) 108 (90 Base) MCG/ACT inhaler Inhale 2 puffs into the lungs every 6 (six) hours as needed for wheezing or shortness of breath. 03/21/23  Yes Hunsucker, Donnice SAUNDERS, MD  allopurinol  (ZYLOPRIM ) 100 MG tablet TAKE 1 TABLET BY MOUTH EVERY DAY 08/10/23  Yes Rice, Lonni ORN, MD  amoxicillin  (AMOXIL ) 875 MG tablet Take 875 mg by mouth 2 (two) times daily.   Yes [provider]  apixaban  (ELIQUIS ) 2.5 MG TABS tablet Take 1 tablet (2.5 mg total) by mouth 2 (two) times daily. Patient taking differently: Take 2.5 mg by mouth 2 (two) times daily. 6am, & 4 pm 05/01/23  Yes Ghimire, Donalda HERO, MD  Budeson-Glycopyrrol-Formoterol  (BREZTRI  AEROSPHERE) 160-9-4.8 MCG/ACT AERO Inhale 2 puffs into the lungs 2 (two) times daily as needed (for flares). 03/21/23  Yes Hunsucker, Donnice SAUNDERS, MD  calcium -vitamin D  (OSCAL WITH D) 500-5 MG-MCG tablet Take 2 tablets by mouth 2 (two) times daily. 07/10/22  Yes Elgergawy, Brayton RAMAN, MD  cetirizine  HCl (ZYRTEC ) 5 MG/5ML SOLN Take 1 mL (1 mg total) by mouth 2 (two) times daily as needed for rhinitis or allergies. 04/21/23  Yes Sebastian Toribio GAILS, MD  Cholecalciferol  (VITAMIN D3) 25 MCG (1000 UT) CAPS Take 1,000 Units by mouth daily with lunch.   Yes [provider]  fluticasone  (FLONASE ) 50 MCG/ACT nasal spray Place 1 spray into both nostrils as needed for allergies or rhinitis.   Yes [provider]  furosemide  (LASIX ) 20 MG tablet Take 20 mg by mouth daily.   Yes [provider]  furosemide  (LASIX ) 40 MG tablet Take 1.5 tablets (60 mg total) by mouth daily. Patient taking differently: Take 40 mg by  mouth daily. 10/26/23  Yes Milford, Harlene HERO, FNP  levETIRAcetam  (KEPPRA ) 500 MG tablet Take 1 tablet (500 mg total) by mouth 2 (two) times daily. 06/07/23  Yes Jaffe, Adam R, DO  magnesium  oxide (MAG-OX) 400 (240 Mg) MG tablet TAKE 1 TABLET BY MOUTH EVERY DAY 07/05/23  Yes Cindie Ole DASEN, MD  Nintedanib  (OFEV ) 150 MG CAPS Take 1 capsule (150 mg total) by mouth at bedtime. 11/01/23  Yes Hunsucker, Donnice SAUNDERS, MD  OLANZapine  (ZYPREXA ) 2.5 MG tablet TAKE 1 TABLET BY MOUTH EVERYDAY AT BEDTIME 08/02/23  Yes Jaffe, Adam R, DO  pantoprazole  (PROTONIX ) 40 MG tablet Take 1 tablet (40 mg total) by mouth 2 (two) times daily. 11/11/22  Yes Regalado, Belkys A, MD  polyethylene glycol powder (MIRALAX ) 17 GM/SCOOP powder Take 17 g by mouth daily as needed for mild constipation. 07/03/21  Yes [provider]  potassium chloride  (KLOR-CON  M) 10 MEQ tablet Take 2 tablets (20 mEq total) by mouth daily. Further refill requests for this medication need to be approved by PCP. Patient taking differently: Take 20 mEq by mouth 2 (two) times daily. Further refill requests for this medication need to be approved by  PCP. 06/03/23  Yes Aniceto Daphne CROME, NP  predniSONE  (DELTASONE ) 5 MG tablet Take 1 tablet (5 mg total) by mouth daily with breakfast. Patient to double up dose during sick day rule 11/30/23  Yes Shamleffer, Ibtehal Jaralla, MD  amoxicillin  (AMOXIL ) 500 MG capsule 4 capsules by mouth 1 hour before dental procedure. Patient not taking: Reported on 12/18/2023 12/30/21   [provider]    Scheduled Meds:  allopurinol   100 mg Oral Daily   amoxicillin   500 mg Oral TID   budesonide -glycopyrrolate -formoterol   2 puff Inhalation BID   calcium -vitamin D   2 tablet Oral BID   cholecalciferol   1,000 Units Oral Q lunch   levETIRAcetam   500 mg Oral BID   magnesium  oxide  400 mg Oral Daily   Nintedanib   150 mg Oral QHS   OLANZapine   2.5 mg Oral QHS   pantoprazole   40 mg Oral BID   potassium chloride   20 mEq Oral  BID   predniSONE   5 mg Oral Q breakfast   Continuous Infusions:  dextrose  50 mL/hr at 12/19/23 1149   PRN Meds:.acetaminophen  **OR** acetaminophen , albuterol , bisacodyl , ondansetron  **OR** ondansetron  (ZOFRAN ) IV, senna-docusate  Allergies as of 12/18/2023 - Review Complete 12/18/2023  Allergen Reaction Noted   Diprivan  [propofol ] Shortness Of Breath and Other (See Comments) 02/06/2007   Other Other (See Comments) 12/26/2021   Tambocor [flecainide] Other (See Comments) 06/19/2020   Norvasc [amlodipine] Other (See Comments) 09/01/2016   Pacerone [amiodarone] Other (See Comments) 09/03/2015    Family History  Problem Relation Age of Onset   Heart disease Mother    Hypertension Mother    Rheum arthritis Mother    Osteoarthritis Mother    Heart disease Father    Hypertension Father    Osteoarthritis Father    Heart disease Sister    Diabetes Brother    Cancer Brother     Social History   Socioeconomic History   Marital status: Legally Separated    Spouse name: Not on file   Number of children: Not on file   Years of education: 16   Highest education level: Bachelor's degree (e.g., BA, AB, BS)  Occupational History   Occupation: 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  Tobacco Use   Smoking status: Never    Passive exposure: Past   Smokeless tobacco: Never  Vaping Use   Vaping status: Never Used  Substance and Sexual Activity   Alcohol  use: Never   Drug use: Never   Sexual activity: Not Currently  Other Topics Concern   Not on file  Social History Narrative   Right Handed    Social Drivers of Health   Financial Resource Strain: Not on file  Food Insecurity: No Food Insecurity (12/19/2023)   Hunger Vital Sign    Worried About Running Out of Food in the Last Year: Never true    Ran Out of Food in the Last Year: Never true  Transportation Needs: No Transportation Needs (12/19/2023)   PRAPARE - Administrator, Civil Service (Medical): No    Lack of  Transportation (Non-Medical): No  Physical Activity: Not on file  Stress: Not on file  Social Connections: Socially Isolated (12/19/2023)   Social Connection and Isolation Panel    Frequency of Communication with Friends and Family: More than three times a week    Frequency of Social Gatherings with Friends and Family: More than three times a week    Attends Religious Services: Never    Active Member  of Clubs or Organizations: No    Attends Banker Meetings: Never    Marital Status: Separated  Intimate Partner Violence: Not At Risk (12/19/2023)   Humiliation, Afraid, Rape, and Kick questionnaire    Fear of Current or Ex-Partner: No    Emotionally Abused: No    Physically Abused: No    Sexually Abused: No    Review of Systems: All negative except as stated above in HPI.  Physical Exam: Vital signs: Vitals:   12/19/23 1347 12/19/23 1402  BP: 121/71 (!) 155/79  Pulse: 70 76  Resp: (!) 23 16  Temp:  (!) 97.5 F (36.4 C)  SpO2: 100%    Last BM Date : 12/19/23 General:  lethargic, elderly, thin, no acute distress, pleasant   Head: normocephalic, atraumatic Eyes: anicteric sclera ENT: oropharynx clear Neck: supple, nontender Lungs:  Clear throughout to auscultation.   No wheezes, crackles, or rhonchi. No acute distress. Heart:  Regular rate and rhythm; no murmurs, clicks, rubs,  or gallops. Abdomen: soft, nontender, nondistended, +BS  Rectal:  Deferred Ext: no edema  GI:  Lab Results: Recent Labs    12/18/23 1852 12/19/23 0452  WBC 8.4 7.0  HGB 10.0* 9.3*  HCT 31.9* 28.5*  PLT 168 155   BMET Recent Labs    12/18/23 1852 12/19/23 0452  NA 137 136  K 4.1 4.1  CL 101 97*  CO2 20* 24  GLUCOSE 76 53*  BUN 40* 47*  CREATININE 1.65* 1.45*  CALCIUM  9.0 9.2   LFT Recent Labs    12/18/23 1852  PROT 7.4  ALBUMIN 3.4*  AST 39  ALT 24  ALKPHOS 54  BILITOT 1.1   PT/INR Recent Labs    12/18/23 1945  LABPROT 17.8*  INR 1.4*      Studies/Results: No results found.  Impression/Plan: GI bleed with red blood and black stools yesterday on Eliquis . Likely lower GI bleed source but needs an EGD to check for ulcers prior to resuming anticoagulation. Hold Eliquis . Clear liquid diet. NPO p MN. Supportive care.    LOS: 1 day   Jerrell JAYSON Sol  12/19/2023, 3:29 PM  Questions please call [phone]

## 2023-12-19 NOTE — ED Notes (Signed)
 Notified hospitalist of CBG 64, patient currently asymptomatic. Verbal order to increase continuous D5W infusion from 30 mL/hr to 50 mL/hr.

## 2023-12-19 NOTE — Progress Notes (Signed)
 PROGRESS NOTE    Lindsey Huber  FMW:[id number] DOB: 05/18/1945 DOA: 12/18/2023 PCP: [name], [name], PA   Brief Narrative:  This 78 y.o. female with medical history significant for HTN, HLD, A-fib on Eliquis , CHB s/p PPM, GERD, CKD 3, hypothyroidism, chronic diastolic CHF, ischemic cardiomyopathy s/p AICD, aortic valve replacement, mitral valve replacement, tricuspid valve replacement, mild dementia, seizure, asthma, acquired hypothyroidism, secondary adrenal insufficiency, pulmonary fibrosis, RA, Raynaud's, lupus, gout, anemia, diverticulosis and recurrent GI bleeds presented to the ED for evaluation of rectal bleeding.  Today patient has bright red blood from her rectum that eventually turned black.  She endorses mild fatigue and shortness of breath but denies any other symptoms.  She is hemodynamically stable.  Hemoglobin 10.0, FOBT positive.  Patient was admitted for further evaluation GI is consulted.  Assessment & Plan:   Principal Problem:   GI bleed Active Problems:   Chronic diastolic HF (heart failure) (HCC)   History of GI diverticular bleed   Status post placement of cardiac pacemaker   History of heart valve replacement with bioprosthetic valve   Recurrent GI bleeding : Patient presented with recurrent GI bleed in the setting of Eliquis  use. She presented with hematochezia and melena. Hgb remains stable at 10.0, baseline 9-10 FOBT positive, Pt hemodynamically stable Colonoscopy in 03/2023 showed diverticulosis in the sigmoid and descending colon but no evidence of active bleeding. GI bleed likely due to diverticular bleed. [facility] consulted, appreciate further recs. Patient has already taken her 2 doses of Eliquis  yesterday, will hold home Eliquis . Due to her recurrent GI bleeds, there would likely need to be a decision made during this hospitalization between GI, cardiology and pateint concerning the risks and benefits of continuing anticoagulation Trend CBC and  Transfuse for Hgb goal > 7   Chronic diastolic HF: S/p bioprosthetic AVR, MVR and TVR: Last TTE in October 2024 showed EF 60 to 65%, moderate LVH, moderately reduced RV SF, severely dilated LA and RA, moderate TVR, and moderate to severe tricuspid stenosis Patient currently pending repeat TTE in mid December Patient appears dry on exam, will hold Lasix  today, resume when appropriate.   CHB s/p PPM: A-fib : Patient with ventricularly paced rhythm Hold Eliquis  in the setting of GI bleed. HR controlled.   Essential HTN: BP stable with SBP in the 110-140s Continue Telemetry.   Rheumatoid arthritis: Raynaud's syndrome: Lupus:  Continue prednisone    Acquired hypothyroidism: Recently seen by endocrinology, have been taken off Synthroid . Thyroid  ultrasound on 12/08/2023 showed slightly increased size of the inferior left thyroid  lobe measuring 2.1 cm from 1.8 cm TSH and free T4 within normal limits 2 weeks ago.   Secondary adrenal insufficiency: Per endocrinology notes, this is due to chronic use of glucocorticoids for her rheumatoid arthritis. Continue prednisone .   Recurrent frontal sinusitis: Diagnosed by PCP 2 days ago. Continue course of amoxicillin .   Seizure disorder: Continue Keppra    Pulmonary fibrosis: Continue nintedanib    Asthma: Continue home bronchodilator   Gout: Continue allopurinol    Mild dementia: Continue Zyprexa  at bedtime   GERD: Continue PPI   DVT prophylaxis: SCDs Code Status: Full code Family Communication: No family at bed side. Disposition Plan:    Status is: Inpatient Remains inpatient appropriate because: Patient was admitted for GI bleeding.  GI is consulted . hemodynamically stable    Consultants:  Gastroenterology  Procedures:  Antimicrobials:  Anti-infectives (From admission, onward)    Start     Dose/Rate Route Frequency Ordered Stop   12/18/23 2315  amoxicillin  (AMOXIL ) capsule 500 mg        500 mg Oral 3 times daily  12/18/23 2232 12/23/23 2159      Subjective: Patient was seen and examined at bedside.  Overnight events noted. Patient denies any further episodes of blood in the stools. She reports feeling tired and fatigue , denies any chest pain.  Objective: Vitals:   12/19/23 0303 12/19/23 0608 12/19/23 0700 12/19/23 1000  BP:  125/71 127/66 133/66  Pulse:  70 69 70  Resp:  18 17 15   Temp: 98.2 F (36.8 C)  98 F (36.7 C)   TempSrc: Oral     SpO2:  100% 100% 100%  Weight:      Height:       No intake or output data in the 24 hours ending 12/19/23 1112 Filed Weights   12/18/23 1744  Weight: 49.9 kg    Examination:  General exam: Appears calm and comfortable, deconditioned, not in any acute distress. Respiratory system: CTA Bilaterally. Respiratory effort normal. RR 15 Cardiovascular system: S1 & S2 heard, RRR. No JVD, murmurs, rubs, gallops or clicks.  Gastrointestinal system: Abdomen is non distended, soft and non tender. Normal bowel sounds heard. Central nervous system: Alert and oriented x 2. No focal neurological deficits. Extremities: No edema, no cyanosis, no clubbing. Skin: No rashes, lesions or ulcers Psychiatry: Judgement and insight appear normal. Mood & affect appropriate.   Data Reviewed: I have personally reviewed following labs and imaging studies  CBC: Recent Labs  Lab 12/18/23 1852 12/19/23 0452  WBC 8.4 7.0  HGB 10.0* 9.3*  HCT 31.9* 28.5*  MCV 97.3 94.4  PLT 168 155   Basic Metabolic Panel: Recent Labs  Lab 12/18/23 1852 12/19/23 0452  NA 137 136  K 4.1 4.1  CL 101 97*  CO2 20* 24  GLUCOSE 76 53*  BUN 40* 47*  CREATININE 1.65* 1.45*  CALCIUM  9.0 9.2   GFR: Estimated Creatinine Clearance: 26 mL/min (A) (by C-G formula based on SCr of 1.45 mg/dL (H)). Liver Function Tests: Recent Labs  Lab 12/18/23 1852  AST 39  ALT 24  ALKPHOS 54  BILITOT 1.1  PROT 7.4  ALBUMIN 3.4*   No results for input(s): LIPASE, AMYLASE in the last 168  hours. No results for input(s): AMMONIA in the last 168 hours. Coagulation Profile: Recent Labs  Lab 12/18/23 1945  INR 1.4*   Cardiac Enzymes: No results for input(s): CKTOTAL, CKMB, CKMBINDEX, TROPONINI in the last 168 hours. BNP (last 3 results) No results for input(s): PROBNP in the last 8760 hours. HbA1C: No results for input(s): HGBA1C in the last 72 hours. CBG: Recent Labs  Lab 12/19/23 0817 12/19/23 0845  GLUCAP 52* 126*   Lipid Profile: No results for input(s): CHOL, HDL, LDLCALC, TRIG, CHOLHDL, LDLDIRECT in the last 72 hours. Thyroid  Function Tests: No results for input(s): TSH, T4TOTAL, FREET4, T3FREE, THYROIDAB in the last 72 hours. Anemia Panel: No results for input(s): VITAMINB12, FOLATE, FERRITIN, TIBC, IRON, RETICCTPCT in the last 72 hours. Sepsis Labs: No results for input(s): PROCALCITON, LATICACIDVEN in the last 168 hours.  No results found for this or any previous visit (from the past 240 hours).   Radiology Studies: No results found.  Scheduled Meds:  allopurinol   100 mg Oral Daily   amoxicillin   500 mg Oral TID   budesonide -glycopyrrolate -formoterol   2 puff Inhalation BID   calcium -vitamin D   2 tablet Oral BID   cholecalciferol   1,000 Units Oral Q lunch  levETIRAcetam   500 mg Oral BID   magnesium  oxide  400 mg Oral Daily   Nintedanib   150 mg Oral QHS   OLANZapine   2.5 mg Oral QHS   pantoprazole   40 mg Oral BID   potassium chloride   20 mEq Oral BID   predniSONE   5 mg Oral Q breakfast   Continuous Infusions:   LOS: 1 day    Time spent: 50 mins    Darcel Dawley, MD [facility]   If 7PM-7AM, please contact night-coverage

## 2023-12-20 ENCOUNTER — Encounter (HOSPITAL_COMMUNITY): Payer: Self-pay | Admitting: Student

## 2023-12-20 ENCOUNTER — Encounter (HOSPITAL_COMMUNITY)
Admission: EM | Disposition: A | Discharge status: Home / Self Care | Payer: Self-pay | Source: Home / Self Care | Attending: Family Medicine

## 2023-12-20 ENCOUNTER — Inpatient Hospital Stay (HOSPITAL_COMMUNITY): Payer: Self-pay | Admitting: Anesthesiology

## 2023-12-20 DIAGNOSIS — I482 Chronic atrial fibrillation, unspecified: Secondary | ICD-10-CM | POA: Diagnosis not present

## 2023-12-20 DIAGNOSIS — K2970 Gastritis, unspecified, without bleeding: Secondary | ICD-10-CM | POA: Diagnosis not present

## 2023-12-20 DIAGNOSIS — I110 Hypertensive heart disease with heart failure: Secondary | ICD-10-CM | POA: Diagnosis not present

## 2023-12-20 DIAGNOSIS — I5032 Chronic diastolic (congestive) heart failure: Secondary | ICD-10-CM

## 2023-12-20 DIAGNOSIS — K922 Gastrointestinal hemorrhage, unspecified: Secondary | ICD-10-CM | POA: Diagnosis not present

## 2023-12-20 HISTORY — PX: ESOPHAGOGASTRODUODENOSCOPY: SHX5428

## 2023-12-20 LAB — MAGNESIUM: Magnesium: 2.3 mg/dL (ref 1.7–2.4)

## 2023-12-20 LAB — GLUCOSE, CAPILLARY
Glucose-Capillary: 68 mg/dL — ABNORMAL LOW (ref 70–99)
Glucose-Capillary: 146 mg/dL — ABNORMAL HIGH (ref 70–99)
Glucose-Capillary: 103 mg/dL — ABNORMAL HIGH (ref 70–99)

## 2023-12-20 LAB — CBC
HCT: 26.9 % — ABNORMAL LOW (ref 36.0–46.0)
Hemoglobin: 8.7 g/dL — ABNORMAL LOW (ref 12.0–15.0)
MCH: 30.3 pg (ref 26.0–34.0)
MCHC: 32.3 g/dL (ref 30.0–36.0)
MCV: 93.7 fL (ref 80.0–100.0)
Platelets: 164 K/uL (ref 150–400)
RBC: 2.87 MIL/uL — ABNORMAL LOW (ref 3.87–5.11)
RDW: 16.8 % — ABNORMAL HIGH (ref 11.5–15.5)
WBC: 5.1 K/uL (ref 4.0–10.5)
nRBC: 0.4 % — ABNORMAL HIGH (ref 0.0–0.2)

## 2023-12-20 LAB — BASIC METABOLIC PANEL WITH GFR
Anion gap: 11 (ref 5–15)
BUN: 37 mg/dL — ABNORMAL HIGH (ref 8–23)
CO2: 25 mmol/L (ref 22–32)
Calcium: 9.7 mg/dL (ref 8.9–10.3)
Chloride: 99 mmol/L (ref 98–111)
Creatinine, Ser: 1.07 mg/dL — ABNORMAL HIGH (ref 0.44–1.00)
GFR, Estimated: 54 mL/min — ABNORMAL LOW (ref >60)
Glucose, Bld: 92 mg/dL (ref 70–99)
Potassium: 5 mmol/L (ref 3.5–5.1)
Sodium: 135 mmol/L (ref 135–145)

## 2023-12-20 LAB — PHOSPHORUS: Phosphorus: 4.8 mg/dL — ABNORMAL HIGH (ref 2.5–4.6)

## 2023-12-20 SURGERY — EGD (ESOPHAGOGASTRODUODENOSCOPY)
Anesthesia: Monitor Anesthesia Care

## 2023-12-20 MED ORDER — APIXABAN 2.5 MG PO TABS
2.5000 mg | ORAL_TABLET | Freq: Two times a day (BID) | ORAL | Status: DC
  Administered 2023-12-21: 2.5 mg via ORAL
  Filled 2023-12-20: qty 1

## 2023-12-20 MED ORDER — AMOXICILLIN 500 MG PO CAPS
500.0000 mg | ORAL_CAPSULE | Freq: Three times a day (TID) | ORAL | Status: DC
  Administered 2023-12-20 – 2023-12-21 (×3): 500 mg via ORAL
  Filled 2023-12-20 (×4): qty 1

## 2023-12-20 MED ORDER — SODIUM CHLORIDE 0.9 % IV SOLN
INTRAVENOUS | Status: AC | PRN
  Administered 2023-12-20: 500 mL via INTRAVENOUS

## 2023-12-20 MED ORDER — LIDOCAINE 2% (20 MG/ML) 5 ML SYRINGE
INTRAMUSCULAR | Status: DC | PRN
Start: 2023-12-20 — End: 2023-12-20
  Administered 2023-12-20: 40 mg via INTRAVENOUS
  Administered 2023-12-20: 20 mg via INTRAVENOUS

## 2023-12-20 MED ORDER — PROPOFOL 10 MG/ML IV BOLUS
INTRAVENOUS | Status: DC | PRN
  Administered 2023-12-20 (×3): 30 mg via INTRAVENOUS
  Administered 2023-12-20: 100 ug/kg/min via INTRAVENOUS

## 2023-12-20 NOTE — Progress Notes (Signed)
 PROGRESS NOTE    Lindsey Huber  FMW:[id number] DOB: Jun 18, 1945 DOA: 12/18/2023 PCP: [name], [name], PA   Brief Narrative:  This 78 y.o. female with medical history significant for HTN, HLD, A-fib on Eliquis , CHB s/p PPM, GERD, CKD 3, hypothyroidism, chronic diastolic CHF, ischemic cardiomyopathy s/p AICD, aortic valve replacement, mitral valve replacement, tricuspid valve replacement, mild dementia, seizure, asthma, acquired hypothyroidism, secondary adrenal insufficiency, pulmonary fibrosis, RA, Raynaud's, lupus, gout, anemia, diverticulosis and recurrent GI bleeds presented to the ED for evaluation of rectal bleeding.  Today patient has bright red blood from her rectum that eventually turned black.  She endorses mild fatigue and shortness of breath but denies any other symptoms.  She is hemodynamically stable.  Hemoglobin 10.0, FOBT positive.  Patient was admitted for further evaluation.  GI is consulted.  Assessment & Plan:   Principal Problem:   GI bleed Active Problems:   Chronic diastolic HF (heart failure) (HCC)   History of GI diverticular bleed   Status post placement of cardiac pacemaker   History of heart valve replacement with bioprosthetic valve   Recurrent GI bleeding : Patient presented with recurrent GI bleed in the setting of Eliquis  use. She presented with hematochezia and melena. Hgb remains stable at 10.0, baseline 9-10 FOBT positive, Pt hemodynamically stable Colonoscopy in 03/2023 showed diverticulosis in the sigmoid and descending colon but no evidence of active bleeding. GI bleed likely due to diverticular bleed. [facility] consulted, scheduled EGD Eliquis  kept on hold. Due to her recurrent GI bleeds, there would likely need to be a decision made during this hospitalization between GI, cardiology and pateint concerning the risks and benefits of continuing anticoagulation. Trend CBC and Transfuse for Hgb goal > 7. Hb dropped to 10 > 9.3  > 8.7 ,  Continue to  trend H/H. EGD shows acute gastritis , hiatal hernia, biopsy taken. Normal  Duodenum. GI recommended anticoagulation can be resumed tomorrow.  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   CHB s/p PPM: A-fib : Patient with ventricularly paced rhythm. Hold Eliquis  in the setting of GI bleed. HR controlled.   Essential HTN: BP stable with SBP in the 110-140s Continue Telemetry.   Rheumatoid arthritis: Raynaud's syndrome: Lupus:  Continue prednisone    Acquired hypothyroidism: Recently seen by endocrinology, have been taken off Synthroid . Thyroid  ultrasound on 12/08/2023 showed slightly increased size of the inferior left thyroid  lobe measuring 2.1 cm from 1.8 cm TSH and free T4 within normal limits 2 weeks ago.   Secondary adrenal insufficiency: Per endocrinology notes, this is due to chronic use of glucocorticoids for her rheumatoid arthritis. Continue prednisone .   Recurrent frontal sinusitis: Diagnosed by PCP 2 days ago. Continue course of amoxicillin .   Seizure disorder: Continue Keppra    Pulmonary fibrosis: Continue nintedanib .   Asthma: Continue home bronchodilator.   Gout: Continue allopurinol .   Mild dementia: Continue Zyprexa  at bedtime.   GERD: Continue PPI.   DVT prophylaxis: SCDs Code Status: Full code Family Communication: No family at bed side. Disposition Plan:    Status is: Inpatient Remains inpatient appropriate because: Patient was admitted for GI bleeding.  GI is consulted . hemodynamically stable Underwent EGD found to have normal esophagus, acute gastritis,  hiatal hernia, normal duodenum.    Consultants:   Gastroenterology  Procedures:  Antimicrobials:  Anti-infectives (From admission, onward)    Start     Dose/Rate Route Frequency Ordered Stop   12/20/23 2200  amoxicillin  (AMOXIL ) capsule 500 mg        500 mg Oral Every 8 hours 12/20/23 1312 12/24/23 0559   12/19/23 1650  amoxicillin  (AMOXIL ) capsule 500 mg  Status:  Discontinued        500 mg Oral Every 12 hours 12/19/23 1651 12/20/23 1312   12/18/23 2315  amoxicillin  (AMOXIL ) capsule 500 mg  Status:  Discontinued        500 mg Oral 3 times daily 12/18/23 2232 12/19/23 1651      Subjective: Patient was seen and examined at bedside.  Overnight events noted. Patient denies any further episodes of blood in the stools. Patient is status post EGD found to have acute gastritis biopsies taken.  Objective: Vitals:   12/20/23 1043 12/20/23 1050 12/20/23 1100 12/20/23 1129  BP: 115/74 (!) 148/87 (!) 153/86 (!) 158/78  Pulse: 71 71 71 70  Resp: (!) 25 (!) 27 18 19   Temp: (!) 97.5 F (36.4 C)   97.6 F (36.4 C)  TempSrc: Temporal   Oral  SpO2: 99% 100% 99% 97%  Weight:      Height:        Intake/Output Summary (Last 24 hours) at 12/20/2023 1409 Last data filed at 12/20/2023 1043 Gross per 24 hour  Intake 1048.1 ml  Output --  Net 1048.1 ml   Filed Weights   12/18/23 1744  Weight: 49.9 kg    Examination:  General exam: Appears calm and comfortable, deconditioned, not in any acute distress. Respiratory system: CTA Bilaterally. Respiratory effort normal. RR 16 Cardiovascular system: S1 & S2 heard, RRR. No JVD, murmurs, rubs, gallops or clicks.  Gastrointestinal system: Abdomen is non distended, soft and non tender. Normal bowel sounds heard. Central nervous system: Alert and oriented x 2. No focal neurological deficits. Extremities: No edema, no cyanosis, no clubbing. Skin: No rashes, lesions or ulcers Psychiatry: Judgement and insight appear normal. Mood & affect appropriate.   Data Reviewed: I have personally reviewed  following labs and imaging studies  CBC: Recent Labs  Lab 12/18/23 1852 12/19/23 0452 12/20/23 0715  WBC 8.4 7.0 5.1  HGB 10.0* 9.3* 8.7*  HCT 31.9* 28.5* 26.9*  MCV 97.3 94.4 93.7  PLT 168 155 164   Basic Metabolic Panel: Recent Labs  Lab 12/18/23 1852 12/19/23 0452 12/20/23 0509  NA 137 136 135  K 4.1 4.1 5.0  CL 101 97* 99  CO2 20* 24 25  GLUCOSE 76 53* 92  BUN 40* 47* 37*  CREATININE 1.65* 1.45* 1.07*  CALCIUM  9.0 9.2 9.7  MG  --   --  2.3  PHOS  --   --  4.8*   GFR: Estimated Creatinine Clearance: 35.2 mL/min (A) (by C-G formula based on SCr of 1.07 mg/dL (H)). Liver Function Tests: Recent Labs  Lab 12/18/23 1852  AST 39  ALT 24  ALKPHOS 54  BILITOT 1.1  PROT 7.4  ALBUMIN 3.4*   No results for input(s): LIPASE, AMYLASE in the last 168 hours. No results for input(s): AMMONIA in the last 168 hours. Coagulation Profile: Recent Labs  Lab 12/18/23 1945  INR 1.4*   Cardiac Enzymes: No results for input(s): CKTOTAL, CKMB, CKMBINDEX, TROPONINI in the last 168 hours. BNP (last 3 results) No results for input(s): PROBNP in the  last 8760 hours. HbA1C: No results for input(s): HGBA1C in the last 72 hours. CBG: Recent Labs  Lab 12/19/23 0845 12/19/23 1133 12/19/23 1641 12/19/23 2354 12/20/23 1124  GLUCAP 126* 64* 77 101* 68*   Lipid Profile: No results for input(s): CHOL, HDL, LDLCALC, TRIG, CHOLHDL, LDLDIRECT in the last 72 hours. Thyroid  Function Tests: No results for input(s): TSH, T4TOTAL, FREET4, T3FREE, THYROIDAB in the last 72 hours. Anemia Panel: No results for input(s): VITAMINB12, FOLATE, FERRITIN, TIBC, IRON, RETICCTPCT in the last 72 hours. Sepsis Labs: No results for input(s): PROCALCITON, LATICACIDVEN in the last 168 hours.  No results found for this or any previous visit (from the past 240 hours).   Radiology Studies: No results found.  Scheduled Meds:  allopurinol   100 mg  Oral Daily   amoxicillin   500 mg Oral Q8H   [START ON 12/21/2023] apixaban   2.5 mg Oral BID   budesonide -glycopyrrolate -formoterol   2 puff Inhalation BID   calcium -vitamin D   2 tablet Oral BID   cholecalciferol   1,000 Units Oral Q lunch   levETIRAcetam   500 mg Oral BID   magnesium  oxide  400 mg Oral Daily   Nintedanib   150 mg Oral QHS   OLANZapine   2.5 mg Oral QHS   pantoprazole   40 mg Oral BID   potassium chloride   20 mEq Oral BID   predniSONE   5 mg Oral Q breakfast   Continuous Infusions:  dextrose  50 mL/hr at 12/20/23 1218     LOS: 2 days    Time spent: 35 mins    Darcel Dawley, MD [facility]   If 7PM-7AM, please contact night-coverage

## 2023-12-20 NOTE — Progress Notes (Signed)
Home medication sent to pharmacy

## 2023-12-20 NOTE — Transfer of Care (Signed)
 Immediate Anesthesia Transfer of Care Note  Patient: Lindsey Huber  Procedure(s) Performed: EGD (ESOPHAGOGASTRODUODENOSCOPY)  Patient Location: PACU and Endoscopy Unit  Anesthesia Type:MAC  Level of Consciousness: drowsy  Airway & Oxygen Therapy: Patient Spontanous Breathing and Patient connected to nasal cannula oxygen  Post-op Assessment: Report given to RN and Post -op Vital signs reviewed and stable  Post vital signs: Reviewed and stable  Last Vitals:  Vitals Value Taken Time  BP 115/74 12/20/23 10:42  Temp 97.6   Pulse 71 12/20/23 10:43  Resp 28 12/20/23 10:43  SpO2 99 % 12/20/23 10:43  Vitals shown include unfiled device data.  Last Pain:  Vitals:   12/20/23 0902  TempSrc: Temporal  PainSc: 0-No pain         Complications: No notable events documented.

## 2023-12-20 NOTE — Anesthesia Preprocedure Evaluation (Signed)
 Anesthesia Evaluation  Patient identified by MRN, date of birth, ID band Patient awake    Reviewed: Allergy & Precautions, NPO status , Patient's Chart, lab work & pertinent test results  Airway Mallampati: II  TM Distance: >3 FB Neck ROM: Full    Dental no notable dental hx.    Pulmonary asthma , sleep apnea    Pulmonary exam normal        Cardiovascular hypertension, +CHF  + dysrhythmias + Cardiac Defibrillator  Rhythm:Regular Rate:Normal     Neuro/Psych Seizures -,       Dementia    GI/Hepatic Neg liver ROS,GERD  Medicated,,  Endo/Other  Hypothyroidism    Renal/GU   negative genitourinary   Musculoskeletal  (+) Arthritis , Osteoarthritis,    Abdominal Normal abdominal exam  (+)   Peds  Hematology  (+) Blood dyscrasia, anemia   Anesthesia Other Findings   Reproductive/Obstetrics                              Anesthesia Physical Anesthesia Plan  ASA: 3  Anesthesia Plan: MAC   Post-op Pain Management:    Induction:   PONV Risk Score and Plan: 2 and Propofol  infusion and Treatment may vary due to age or medical condition  Airway Management Planned: Simple Face Mask and Nasal Cannula  Additional Equipment: None  Intra-op Plan:   Post-operative Plan:   Informed Consent: I have reviewed the patients History and Physical, chart, labs and discussed the procedure including the risks, benefits and alternatives for the proposed anesthesia with the patient or authorized representative who has indicated his/her understanding and acceptance.     Dental advisory given  Plan Discussed with: CRNA  Anesthesia Plan Comments:         Anesthesia Quick Evaluation

## 2023-12-20 NOTE — Plan of Care (Signed)
  Problem: Coping: Goal: Level of anxiety will decrease Outcome: Progressing   Problem: Pain Managment: Goal: General experience of comfort will improve and/or be controlled Outcome: Progressing   Problem: Safety: Goal: Ability to remain free from injury will improve Outcome: Progressing   Problem: Skin Integrity: Goal: Risk for impaired skin integrity will decrease Outcome: Progressing

## 2023-12-20 NOTE — Op Note (Signed)
 [facility] Patient Name: Lindsey Huber Procedure Date : 12/20/2023 MRN: [id number] Attending MD: Jerrell JAYSON Sol , MD, [id number] Date of Birth: [birth year] CSN: [id number] Age: 78 Admit Type: Inpatient Procedure:                Upper GI endoscopy Indications:              Melena Providers:                Jerrell KYM Sol, MD, Darleene Bare, RN, Coye Bade, Technician Referring MD:             hospital team Medicines:                Propofol  per Anesthesia, Monitored Anesthesia Care Complications:            No immediate complications. Estimated Blood Loss:     Estimated blood loss was minimal. Procedure:                Pre-Anesthesia Assessment:                           - Prior to the procedure, a History and Physical                            was performed, and patient medications and                            allergies were reviewed. The patient's tolerance of                            previous anesthesia was also reviewed. The risks                            and benefits of the procedure and the sedation                            options and risks were discussed with the patient.                            All questions were answered, and informed consent                            was obtained. Prior Anticoagulants: The patient has                            taken Eliquis  (apixaban ), last dose was stopped at                            admission. ASA Grade Assessment: III - A patient                            with severe systemic disease. After reviewing the  risks and benefits, the patient was deemed in                            satisfactory condition to undergo the procedure.                           After obtaining informed consent, the endoscope was                            passed under direct vision. Throughout the                            procedure, the patient's blood pressure,  pulse, and                            oxygen saturations were monitored continuously. The                            [id number]-[id number] ([id number]) Olympus endoscope was introduced                            through the mouth, and advanced to the second part                            of duodenum. The upper GI endoscopy was                            accomplished without difficulty. The patient                            tolerated the procedure well. Scope In: Scope Out: Findings:      The examined esophagus was normal.      The Z-line was regular and was found 38 cm from the incisors.      Segmental moderate inflammation characterized by congestion (edema) and       erythema was found in the gastric antrum. Biopsies were taken with a       cold forceps for histology. Estimated blood loss was minimal.      A medium-sized hiatal hernia was present.      The examined duodenum was normal. Impression:               - Normal esophagus.                           - Z-line regular, 38 cm from the incisors.                           - Acute gastritis. Biopsied.                           - Medium-sized hiatal hernia.                           - Normal examined duodenum. Recommendation:           - Advance diet as tolerated and clear  liquid diet.                           - Await pathology results.                           - Observe patient's clinical course. Procedure Code(s):        --- Professional ---                           [phone], Esophagogastroduodenoscopy, flexible,                            transoral; with biopsy, single or multiple Diagnosis Code(s):        --- Professional ---                           K92.1, Melena (includes Hematochezia)                           K29.00, Acute gastritis without bleeding                           K44.9, Diaphragmatic hernia without obstruction or                            gangrene CPT copyright 2022 [employer]. All rights reserved. The  codes documented in this report are preliminary and upon coder review may  be revised to meet current compliance requirements. Jerrell JAYSON Sol, MD 12/20/2023 10:43:43 AM This report has been signed electronically. Number of Addenda: 0

## 2023-12-20 NOTE — Interval H&P Note (Signed)
 History and Physical Interval Note:  12/20/2023 10:17 AM  Lindsey Huber  has presented today for surgery, with the diagnosis of Melena.  The various methods of treatment have been discussed with the patient and family. After consideration of risks, benefits and other options for treatment, the patient has consented to  Procedure(s): EGD (ESOPHAGOGASTRODUODENOSCOPY) (N/A) as a surgical intervention.  The patient's history has been reviewed, patient examined, no change in status, stable for surgery.  I have reviewed the patient's chart and labs.  Questions were answered to the patient's satisfaction.     Jerrell JAYSON Sol

## 2023-12-20 NOTE — Progress Notes (Signed)
 Patient OTF transported to Endo.

## 2023-12-21 DIAGNOSIS — K922 Gastrointestinal hemorrhage, unspecified: Secondary | ICD-10-CM | POA: Diagnosis not present

## 2023-12-21 LAB — CBC
HCT: 24.8 % — ABNORMAL LOW (ref 36.0–46.0)
Hemoglobin: 8.3 g/dL — ABNORMAL LOW (ref 12.0–15.0)
MCH: 31.3 pg (ref 26.0–34.0)
MCHC: 33.5 g/dL (ref 30.0–36.0)
MCV: 93.6 fL (ref 80.0–100.0)
Platelets: 173 K/uL (ref 150–400)
RBC: 2.65 MIL/uL — ABNORMAL LOW (ref 3.87–5.11)
RDW: 16.7 % — ABNORMAL HIGH (ref 11.5–15.5)
WBC: 5.5 K/uL (ref 4.0–10.5)
nRBC: 0 % (ref 0.0–0.2)

## 2023-12-21 LAB — PHOSPHORUS: Phosphorus: 4.2 mg/dL (ref 2.5–4.6)

## 2023-12-21 LAB — MAGNESIUM: Magnesium: 2.2 mg/dL (ref 1.7–2.4)

## 2023-12-21 LAB — SURGICAL PATHOLOGY

## 2023-12-21 LAB — GLUCOSE, CAPILLARY
Glucose-Capillary: 70 mg/dL (ref 70–99)
Glucose-Capillary: 125 mg/dL — ABNORMAL HIGH (ref 70–99)

## 2023-12-21 LAB — BASIC METABOLIC PANEL WITH GFR
Anion gap: 10 (ref 5–15)
BUN: 25 mg/dL — ABNORMAL HIGH (ref 8–23)
CO2: 23 mmol/L (ref 22–32)
Calcium: 9.2 mg/dL (ref 8.9–10.3)
Chloride: 101 mmol/L (ref 98–111)
Creatinine, Ser: 1.17 mg/dL — ABNORMAL HIGH (ref 0.44–1.00)
GFR, Estimated: 48 mL/min — ABNORMAL LOW (ref >60)
Glucose, Bld: 81 mg/dL (ref 70–99)
Potassium: 4.6 mmol/L (ref 3.5–5.1)
Sodium: 134 mmol/L — ABNORMAL LOW (ref 135–145)

## 2023-12-21 NOTE — Plan of Care (Signed)

## 2023-12-21 NOTE — Discharge Summary (Addendum)
 Physician Discharge Summary  Khanh Cordner FMW:[id number] DOB: 1945-06-15 DOA: 12/18/2023  PCP: [name], [name], PA  Admit date: 12/18/2023 Discharge date: 12/21/2023  Admitted from: Home Discharge disposition: Home  Recommendations at discharge:  Continue Protonix  twice daily as before Okay to resume Eliquis  in 3 days  Subjective: Patient was seen and examined this morning. Pleasant elderly female.  Sitting up in bed.  Not in distress.  Able to tolerate soft diet.  Feels ready to go home.  Afebrile, hemodynamically stable, breathing room air Labs this morning with hemoglobin 8.3, BUNs/creatinine 25/1.17, sodium 134  Brief narrative: Lindsey Huber is a 78 y.o. female with PMH significant for HTN, HLD, A-fib on Eliquis , CHB s/p PPM, chronic diastolic CHF, ischemic cardiomyopathy s/p AICD, aortic valve replacement, mitral valve replacement, tricuspid valve replacement, GERD, CKD 3, hypothyroidism, mild dementia, seizure, asthma, secondary adrenal insufficiency, pulmonary fibrosis, RA, Raynaud's, lupus, gout, anemia, diverticulosis and recurrent GI bleeds  11/23, patient presented to the ED for evaluation of rectal bleeding.  Reported BRBPR that eventually turned black.   Was hemodynamically stable with hemoglobin of 10, FOBT positive Admitted to [facility] GI consulted 11/25, underwent EGD, showed medium size hiatal hernia, acute gastritis that was biopsied  Hospital course: Acute GI bleeding  Acute gastritis Hiatal hernia, GERD Presented with BRBPR, melena in the setting of chronic anticoagulation, chronic steroids and prior history of recurrent GI bleeding.   Seen by GI 11/25, underwent EGD, showed medium size hiatal hernia, acute gastritis that was biopsied Most recent colonoscopy from 03/2023 done for GI bleeding showed diverticulosis in the sigmoid and descending colon without active bleeding. Repeat colonoscopy was not done this admission Hemoglobin stable as below Able  to tolerate liquid diet, advance to soft diet today Eliquis  was held on admission.  Per GI okay to resume in 3 days. PPI to continue given need of chronic anticoagulation and chronic steroids  Acute on chronic anemia Hemoglobin at baseline close to 10.  Currently low but stable between 8 and 9  Continue to monitor.  Transfuse if less than 7 Recent Labs    10/12/23 1618 12/18/23 1852 12/19/23 0452 12/20/23 0715 12/21/23 0435  HGB 10.1* 10.0* 9.3* 8.7* 8.3*  MCV 96.4 97.3 94.4 93.7 93.6   Chronic diastolic CHF HTN S/p bioprosthetic AVR, MVR and TVR Last TTE in October 2024 showed EF 60 to 65%, moderate LVH, moderately reduced RV SF, severely dilated LA and RA, moderate TVR, and moderate to severe tricuspid stenosis Patient currently pending repeat TTE in mid December PTA meds-Lasix  40 mg daily Continue Lasix  post discharge   CHB s/p PPM A-fib Patient with ventricularly paced rhythm. Eliquis  plan as above  Rheumatoid arthritis Raynaud's syndrome Lupus Continue prednisone    Acquired hypothyroidism Left thyroid  lobe mass Recently seen by endocrinology and was taken off Synthroid .  Recent TSH and free T4 within normal limits  Thyroid  ultrasound on 12/08/2023 showed slightly increased size of the inferior left thyroid  lobe measuring 2.1 cm from 1.8 cm To follow-up with endocrinologist as an outpatient   Secondary adrenal insufficiency Per endocrinology notes, this is due to chronic use of glucocorticoids for her rheumatoid arthritis. Continue prednisone .   Recurrent frontal sinusitis Diagnosed by PCP 2 days prior to presentation. Continue course of amoxicillin .   Seizure disorder Continue Keppra    Pulmonary fibrosis Asthma Continue nintedanib . Continue home bronchodilator.   Gout Continue allopurinol .   Mild dementia Continue Zyprexa  at bedtime.   Diet:  Diet Order  DIET SOFT Room service appropriate? Yes; Fluid consistency: Thin  Diet effective  now           Diet general                   Nutritional status:  Body mass index is 19.49 kg/m.       Wounds:  -    Discharge Medications:   Allergies as of 12/21/2023       Reactions   Diprivan  [propofol ] Shortness Of Breath, Other (See Comments)   Caused asthma   Other Other (See Comments)   Patient is to NOT EAT any foods with husks or tree nuts, popcorn   Tambocor [flecainide] Other (See Comments)   Unknown reaction   Norvasc [amlodipine] Other (See Comments)   Fatigue Syncope   Pacerone [amiodarone] Other (See Comments)   Delirium Confusion Psychosis        Medication List     PAUSE taking these medications    apixaban  2.5 MG Tabs tablet Wait to take this until: December 23, 2023 Commonly known as: ELIQUIS  Take 1 tablet (2.5 mg total) by mouth 2 (two) times daily. What changed: additional instructions       STOP taking these medications    amoxicillin  500 MG capsule Commonly known as: AMOXIL    amoxicillin  875 MG tablet Commonly known as: AMOXIL        TAKE these medications    acetaminophen  500 MG tablet Commonly known as: TYLENOL  Take 1,000 mg by mouth every 6 (six) hours as needed (pain).   albuterol  108 (90 Base) MCG/ACT inhaler Commonly known as: VENTOLIN  HFA Inhale 2 puffs into the lungs every 6 (six) hours as needed for wheezing or shortness of breath.   allopurinol  100 MG tablet Commonly known as: ZYLOPRIM  TAKE 1 TABLET BY MOUTH EVERY DAY   Breztri  Aerosphere 160-9-4.8 MCG/ACT Aero inhaler Generic drug: budesonide -glycopyrrolate -formoterol  Inhale 2 puffs into the lungs 2 (two) times daily as needed (for flares).   calcium -vitamin D  500-5 MG-MCG tablet Commonly known as: OSCAL WITH D Take 2 tablets by mouth 2 (two) times daily.   cetirizine  HCl 5 MG/5ML Soln Commonly known as: Zyrtec  Take 1 mL (1 mg total) by mouth 2 (two) times daily as needed for rhinitis or allergies.   fluticasone  50 MCG/ACT nasal  spray Commonly known as: FLONASE  Place 1 spray into both nostrils as needed for allergies or rhinitis.   furosemide  40 MG tablet Commonly known as: LASIX  Take 1.5 tablets (60 mg total) by mouth daily. What changed:  how much to take Another medication with the same name was removed. Continue taking this medication, and follow the directions you see here.   levETIRAcetam  500 MG tablet Commonly known as: KEPPRA  Take 1 tablet (500 mg total) by mouth 2 (two) times daily.   magnesium  oxide 400 (240 Mg) MG tablet Commonly known as: MAG-OX TAKE 1 TABLET BY MOUTH EVERY DAY   MiraLax  17 GM/SCOOP powder Generic drug: polyethylene glycol powder Take 17 g by mouth daily as needed for mild constipation.   Ofev  150 MG Caps Generic drug: Nintedanib  Take 1 capsule (150 mg total) by mouth at bedtime.   OLANZapine  2.5 MG tablet Commonly known as: ZYPREXA  TAKE 1 TABLET BY MOUTH EVERYDAY AT BEDTIME   pantoprazole  40 MG tablet Commonly known as: Protonix  Take 1 tablet (40 mg total) by mouth 2 (two) times daily.   potassium chloride  10 MEQ tablet Commonly known as: KLOR-CON  M Take 2 tablets (20 mEq total) by  mouth daily. Further refill requests for this medication need to be approved by PCP. What changed: when to take this   predniSONE  5 MG tablet Commonly known as: DELTASONE  Take 1 tablet (5 mg total) by mouth daily with breakfast. Patient to double up dose during sick day rule   Vitamin D3 25 MCG (1000 UT) Caps Take 1,000 Units by mouth daily with lunch.         Follow ups:    Follow-up Information     Miller, [name], PA Follow up.   Specialty: Internal Medicine Contact information: [address] [place] [address] [phone]                 Discharge Instructions:   Discharge Instructions     Call MD for:  difficulty breathing, headache or visual disturbances   Complete by: As directed    Call MD for:  extreme fatigue   Complete by:  As directed    Call MD for:  hives   Complete by: As directed    Call MD for:  persistant dizziness or light-headedness   Complete by: As directed    Call MD for:  persistant nausea and vomiting   Complete by: As directed    Call MD for:  severe uncontrolled pain   Complete by: As directed    Call MD for:  temperature >100.4   Complete by: As directed    Diet general   Complete by: As directed    Discharge instructions   Complete by: As directed    Recommendations at discharge:   Continue Protonix  twice daily as before  Okay to resume Eliquis  in 3 days  Discharge instructions for diabetes mellitus: Check blood sugar 3 times a day and bedtime at home. If blood sugar running above 200 or less than 70 please call your MD to adjust insulin . If you notice signs and symptoms of hypoglycemia (low blood sugar) like jitteriness, confusion, thirst, tremor and sweating, please check blood sugar, drink sugary drink/biscuits/sweets to increase sugar level and call MD or return to ER.      General discharge instructions: Follow with Primary MD [name], [name], PA in 7 days  Please request your PCP  to go over your hospital tests, procedures, radiology results at the follow up. Please get your medicines reviewed and adjusted.  Your PCP may decide to repeat certain labs or tests as needed. Do not drive, operate heavy machinery, perform activities at heights, swimming or participation in water activities or provide baby sitting services if your were admitted for syncope or siezures until you have seen by Primary MD or a Neurologist and advised to do so again. [place]  Controlled Substance Reporting System database was reviewed. Do not drive, operate heavy machinery, perform activities at heights, swim, participate in water activities or provide baby-sitting services while on medications for pain, sleep and mood until your outpatient physician has reevaluated you and advised to do so again.   You are strongly recommended to comply with the dose, frequency and duration of prescribed medications. Activity: As tolerated with Full fall precautions use walker/cane & assistance as needed Avoid using any recreational substances like cigarette, tobacco, alcohol , or non-prescribed drug. If you experience worsening of your admission symptoms, develop shortness of breath, life threatening emergency, suicidal or homicidal thoughts you must seek medical attention immediately by calling 911 or calling your MD immediately  if symptoms less severe. You must read complete instructions/literature along with all  the possible adverse reactions/side effects for all the medicines you take and that have been prescribed to you. Take any new medicine only after you have completely understood and accepted all the possible adverse reactions/side effects.  Wear Seat belts while driving. You were cared for by a hospitalist during your hospital stay. If you have any questions about your discharge medications or the care you received while you were in the hospital after you are discharged, you can call the unit and ask to speak with the hospitalist or the covering physician. Once you are discharged, your primary care physician will handle any further medical issues. Please note that NO REFILLS for any discharge medications will be authorized once you are discharged, as it is imperative that you return to your primary care physician (or establish a relationship with a primary care physician if you do not have one).   Increase activity slowly   Complete by: As directed        Discharge Exam:   Vitals:   12/20/23 2107 12/20/23 2131 12/21/23 0512 12/21/23 0825  BP:  121/61 118/62   Pulse:  69 70   Resp:   16   Temp:  98.1 F (36.7 C) 97.8 F (36.6 C)   TempSrc:  Oral Oral   SpO2: 97% 100% 97% 98%  Weight:      Height:        Body mass index is 19.49 kg/m.  General exam: Pleasant, elderly.  Not in  distress Skin: No rashes, lesions or ulcers. HEENT: Atraumatic, normocephalic, no obvious bleeding Lungs: Clear to auscultation bilaterally,  CVS: S1, S2, no murmur,   GI/Abd: Soft, nontender, nondistended, bowel sound present,   CNS: Alert, awake, oriented x 3 Psychiatry: Mood appropriate Extremities: No pedal edema, no calf tenderness   The results of significant diagnostics from this hospitalization (including imaging, microbiology, ancillary and laboratory) are listed below for reference.    Procedures and Diagnostic Studies:   No results found.   Labs:   Basic Metabolic Panel: Recent Labs  Lab 12/18/23 1852 12/19/23 0452 12/20/23 0509 12/21/23 0435  NA 137 136 135 134*  K 4.1 4.1 5.0 4.6  CL 101 97* 99 101  CO2 20* 24 25 23   GLUCOSE 76 53* 92 81  BUN 40* 47* 37* 25*  CREATININE 1.65* 1.45* 1.07* 1.17*  CALCIUM  9.0 9.2 9.7 9.2  MG  --   --  2.3 2.2  PHOS  --   --  4.8* 4.2   GFR Estimated Creatinine Clearance: 32.2 mL/min (A) (by C-G formula based on SCr of 1.17 mg/dL (H)). Liver Function Tests: Recent Labs  Lab 12/18/23 1852  AST 39  ALT 24  ALKPHOS 54  BILITOT 1.1  PROT 7.4  ALBUMIN 3.4*   No results for input(s): LIPASE, AMYLASE in the last 168 hours. No results for input(s): AMMONIA in the last 168 hours. Coagulation profile Recent Labs  Lab 12/18/23 1945  INR 1.4*    CBC: Recent Labs  Lab 12/18/23 1852 12/19/23 0452 12/20/23 0715 12/21/23 0435  WBC 8.4 7.0 5.1 5.5  HGB 10.0* 9.3* 8.7* 8.3*  HCT 31.9* 28.5* 26.9* 24.8*  MCV 97.3 94.4 93.7 93.6  PLT 168 155 164 173   Cardiac Enzymes: No results for input(s): CKTOTAL, CKMB, CKMBINDEX, TROPONINI in the last 168 hours. BNP: Invalid input(s): POCBNP CBG: Recent Labs  Lab 12/20/23 1124 12/20/23 1740 12/20/23 2329 12/21/23 0508 12/21/23 1208  GLUCAP 68* 146* 103* 70 125*   D-Dimer No  results for input(s): DDIMER in the last 72 hours. Hgb A1c No results for  input(s): HGBA1C in the last 72 hours. Lipid Profile No results for input(s): CHOL, HDL, LDLCALC, TRIG, CHOLHDL, LDLDIRECT in the last 72 hours. Thyroid  function studies No results for input(s): TSH, T4TOTAL, T3FREE, THYROIDAB in the last 72 hours.  Invalid input(s): FREET3 Anemia work up No results for input(s): VITAMINB12, FOLATE, FERRITIN, TIBC, IRON, RETICCTPCT in the last 72 hours. Microbiology No results found for this or any previous visit (from the past 240 hours).  Time coordinating discharge: 45 minutes  Signed: Mikia Delaluz  [facility] 12/21/2023, 1:31 PM

## 2023-12-21 NOTE — Progress Notes (Signed)
 Reviewed AVS, patient expressed understanding of medications, MD follow up reviewed.   Patient states all belongings brought to the hospital at time of admission are accounted for and packed to take home.  Picked up medications from [facility]. Nursing Staff contacted to transport patient to entrance A, where family member was waiting in vehicle to transport home.

## 2023-12-21 NOTE — Progress Notes (Signed)
 Patient received home medication from pharmacy.

## 2023-12-21 NOTE — Progress Notes (Signed)
 [facility] Gastroenterology Progress Note  Lindsey Huber 78 y.o. Feb 21, 1945   Subjective: Sitting in bedside chair eating breakfast.  Denies abdominal pain.  Small greenish-black stool reported this morning.  Objective: Vital signs: Vitals:   12/21/23 0512 12/21/23 0825  BP: 118/62   Pulse: 70   Resp: 16   Temp: 97.8 F (36.6 C)   SpO2: 97% 98%    Physical Exam: Gen: Lethargic, elderly, thin, no acute distress, pleasant HEENT: anicteric sclera CV: RRR Chest: CTA B Abd: Soft nontender nondistended positive bowel sounds  Lab Results: Recent Labs    12/20/23 0509 12/21/23 0435  NA 135 134*  K 5.0 4.6  CL 99 101  CO2 25 23  GLUCOSE 92 81  BUN 37* 25*  CREATININE 1.07* 1.17*  CALCIUM  9.7 9.2  MG 2.3 2.2  PHOS 4.8* 4.2   Recent Labs    12/18/23 1852  AST 39  ALT 24  ALKPHOS 54  BILITOT 1.1  PROT 7.4  ALBUMIN 3.4*   Recent Labs    12/20/23 0715 12/21/23 0435  WBC 5.1 5.5  HGB 8.7* 8.3*  HCT 26.9* 24.8*  MCV 93.7 93.6  PLT 164 173      Assessment/Plan: GI bleed that seems to have resolved-hemoglobin 8.3.  EGD showed mild gastritis without any visible bleeding.  No plans for additional GI procedures.  Resume Eliquis  in 3 days if okay with primary team.  Will sign off.  Call if questions.   Jerrell JAYSON Sol 12/21/2023, 11:06 AM  Questions please call [phone] ID: Lindsey Huber, female   DOB: 01/17/1946, 78 y.o.   MRN: [id number]

## 2023-12-21 NOTE — TOC Initial Note (Signed)
 Transition of Care [facility]) - Initial/Assessment Note    Patient Details  Name: Lindsey Huber MRN: [id number] Date of Birth: [birth year]  Transition of Care [facility]) CM/SW Contact:    [name] [name] Phone Number: 12/21/2023, 4:04 PM  Clinical Narrative:                 Pt admitted from home due to GI bleeding. No current TOC needs, please consult as needs arise.    Barriers to Discharge: Continued Medical Work up   Patient Goals and CMS Choice            Expected Discharge Plan and Services         Expected Discharge Date: 12/21/23                                    Prior Living Arrangements/Services     Patient language and need for interpreter reviewed:: Yes Do you feel safe going back to the place where you live?: Yes      Need for Family Participation in Patient Care: No (Comment) Care giver support system in place?: Yes (comment)   Criminal Activity/Legal Involvement Pertinent to Current Situation/Hospitalization: No - Comment as needed  Activities of Daily Living   ADL Screening (condition at time of admission) Independently performs ADLs?: No Is the patient deaf or have difficulty hearing?: No Does the patient have difficulty seeing, even when wearing glasses/contacts?: No Does the patient have difficulty concentrating, remembering, or making decisions?: Yes (sometimes)  Permission Sought/Granted      Share Information with NAME: [name]     Permission granted to share info w Relationship: Daughter  Permission granted to share info w Contact Information: [phone]  Emotional Assessment Appearance:: Appears stated age     Orientation: : Oriented to Self, Oriented to Place, Oriented to  Time, Oriented to Situation Alcohol  / Substance Use: Not Applicable Psych Involvement: No (comment)  Admission diagnosis:  Coagulopathy [D68.9] GI bleed [K92.2] Acute GI bleeding [K92.2] Patient Active Problem List   Diagnosis Date Noted   GI  bleed 12/18/2023   Status post placement of cardiac pacemaker 12/18/2023   History of heart valve replacement with bioprosthetic valve 12/18/2023   Asthma, chronic 04/23/2023   Acute GI bleeding 04/23/2023   Gastrointestinal hemorrhage with melena 04/23/2023   History of GI diverticular bleed 04/17/2023   Chronic a-fib (HCC) 11/10/2022   S/P AVR 11/10/2022   S/P TVR (tricuspid valve repair) 11/10/2022   S/P mitral valve repair 11/10/2022   Seizure (HCC) 10/26/2022   Mild dementia 08/12/2021   Allergic rhinitis    Chronic gouty arthritis    Epistaxis    Iron deficiency anemia    Long term (current) use of anticoagulants    Malnutrition of mild degree [name]: 75% to less than 90% of standard weight)    Oropharyngeal dysphagia    Osteopenia of neck of left femur    Raynaud's disease    Rheumatoid arthritis    History of TIA (transient ischemic attack)    Slow transit constipation    Thrombophilia    Achalasia    Delusional thoughts    Recurrent falls 04/09/2021   Hypokalemia 12/14/2020   Hypocalcemia 12/14/2020   Hypomagnesemia 12/14/2020   Non-toxic multinodular goiter 08/20/2020   Acquired hypothyroidism 08/20/2020   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   Vitamin D  deficiency 05/01/2020   Proteinuria 05/01/2020   Osteoarthritis of knee 05/01/2020  Osteoarthritis of hip 05/01/2020   Gastroesophageal reflux disease 05/01/2020   Idiopathic pulmonary fibrosis 05/01/2020   Systemic lupus erythematosus 05/01/2020   Chronic diastolic HF (heart failure) (HCC) 05/01/2020   Chronic kidney disease (CKD), active medical management without dialysis, stage 3 (moderate) 05/01/2020   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   Atherosclerosis of abdominal aorta 05/01/2020   Diverticulosis of colon 05/01/2020   Cholelithiasis without obstruction 05/01/2020   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   AICD (automatic cardioverter/defibrillator) present  11/14/2019   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 [name] Phone: [phone] Fax: [phone]  [facility] [name], [name] - [address] [name] [address] Phone: [phone] Fax: [phone]     Social Drivers of Health (SDOH) Social History: SDOH Screenings   Food Insecurity: No Food Insecurity (12/19/2023)  Housing: Low Risk  (12/19/2023)  Transportation Needs: No Transportation Needs (12/19/2023)  Utilities: Not At Risk (12/19/2023)  Social Connections: Socially Isolated (12/19/2023)  Tobacco Use: Low Risk  (11/30/2023)   SDOH Interventions:     Readmission Risk Interventions    11/05/2022   10:54 AM 07/05/2022   12:36 PM 12/27/2021    3:07 PM  Readmission Risk Prevention Plan  Transportation Screening Complete Complete Complete  PCP or Specialist Appt within 3-5 Days  Complete Complete  HRI or Home Care Consult  Complete Complete  Social Work Consult for Recovery Care Planning/Counseling  Complete Complete  Palliative Care Screening  Not Applicable Complete  Medication Review Oceanographer) Referral to Pharmacy Referral to Pharmacy Complete  PCP or Specialist appointment within 3-5 days of discharge Complete    HRI or Home Care Consult Complete    SW Recovery Care/Counseling Consult Complete    Palliative Care Screening  Not Applicable    Skilled Nursing Facility Not Applicable

## 2023-12-21 NOTE — Plan of Care (Signed)

## 2023-12-21 NOTE — Care Management Important Message (Signed)
 Important Message  Patient Details  Name: Lindsey Huber MRN: [id number] Date of Birth: Feb 13, 1945   Important Message Given:  Yes - Medicare IM     Jennie Laneta Dragon 12/21/2023, 12:33 PM

## 2023-12-22 ENCOUNTER — Encounter (HOSPITAL_COMMUNITY): Payer: Self-pay | Admitting: Gastroenterology

## 2023-12-22 LAB — TYPE AND SCREEN
ABO/RH(D): A NEG
Antibody Screen: POSITIVE
Unit division: 0
Unit division: 0

## 2023-12-22 LAB — BPAM RBC
Blood Product Expiration Date: 202512202359
Blood Product Expiration Date: 202512212359
Unit Type and Rh: 600
Unit Type and Rh: 600

## 2023-12-22 NOTE — Anesthesia Postprocedure Evaluation (Signed)
 Anesthesia Post Note  Patient: Lindsey Huber  Procedure(s) Performed: EGD (ESOPHAGOGASTRODUODENOSCOPY)     Patient location during evaluation: PACU Anesthesia Type: MAC Level of consciousness: awake and alert Pain management: pain level controlled Vital Signs Assessment: post-procedure vital signs reviewed and stable Respiratory status: spontaneous breathing, nonlabored ventilation, respiratory function stable and patient connected to nasal cannula oxygen Cardiovascular status: stable and blood pressure returned to baseline Postop Assessment: no apparent nausea or vomiting Anesthetic complications: no   No notable events documented.  Last Vitals:  Vitals:   12/21/23 0512 12/21/23 0825  BP: 118/62   Pulse: 70   Resp: 16   Temp: 36.6 C   SpO2: 97% 98%    Last Pain:  Vitals:   12/21/23 1009  TempSrc:   PainSc: 0-No pain                 Cordella P Omarian Jaquith

## 2023-12-26 NOTE — Progress Notes (Unsigned)
 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    Follow up in 6 months      Subjective:  Lindsey Huber is a 78 year old right-handed female with a fib with [employer] ICD, cardiomyopathy, HTN, CHF, lupus, pulmonary fibrosis, untreated OSA, s/p AVR and CKD who follows up for seizures and dementia.  She is accompanied by her daughter who supplements history.     UPDATE: Current medications:  Keppra  500mg  twice daily, Eliquis , Lopressor , Lasix , olanzapine  2.5mg  at bedtime  She was just discharged from the hospital for another GI bleed and found to have acute gastritis.  Eliquis  was initially held but then resumed after 3 days.  She has had a couple of headaches related to recent sinusitis but that has resolved.  She is sleeping better now.  Denies hallucinations or agitation.  No seizures.      HISTORY:  On 07/01/2020, the patient experienced a mild headache off and on during the day.  She also had a left temporal headache and noted intermittent numbness and tingling in the bilateral lower extremities.  That evening, she was talking with family when she suddenly started holding the right side of her face, eyes became big and she started drooling.  When her family spoke to her, she could only moan.  She was unable to repeat.  She could not smile but did not exhibit facial droop or unilateral weakness.  After 5-6 minutes, she started speaking but it was slurred and didn't make sense.  She was unable to repeat phrases.  She has no recollection of this.  No convulsions or incontinence  She was brought to the ED.  CTA of head and neck personally reviewed was negative for large vessel occlusion or hemodynamically significant stenosis.   Unable to have an MRI due to pacemaker.  She has a fib which is treated with Xarelto .  Routine awake and asleep EEG on 07/14/2020 was normal.   On the morning of 03/14/2021, she was noted by her daughter to be acting strange.  When she spoke, she had trouble putting words together and did not make sense.  After about an hour, she became unresponsive, started staring off, making moaning sounds and exhibited head twitching and shaking of both hands.  Lasted 2-3 minutes.  No incontinence or tongue biting.  She continued having some slurred speech and difficulty getting words out afterwards.  In hindsight, she says that she does remember feeling strange during this event and struggling to talk.  No lateralizing symptoms such as facial droop or unilateral limb weakness.  EMS was called and symptoms resolved en route to the ED.  2 days prior, she had a fall and landed on her left arm.  CT head personally reviewed revealed no acute intracranial abnormality.  MRI was unable to be performed because there were no [employer] reps that day who could come to the hospital to turn off the pacemaker.  She was found to have cellulitis on her left medial forearm where she had fallen.  Labs revealed no leukocytes and electrolytes normal.  She was discharged on Keflex .    On 09/29/2022, she was sitting at the table when she suddenly started drooling with slight right sided facial droop and slurred speech.  No extremity weakness.  Symptoms lasted  15 minutes.  She was seen in the ED.  CT head revealed advanced chronic small vessel ischemic changes but no acute stroke.  CTA head and neck revealed tortuosity of the intracranial and extracranial arteries and mild to moderate atheromatous changes within the carotid bifurcations but no LVO or hemodynamically significant stenosis.  As she was already on maximal medical management and back to baseline, she was discharged.   Beginning in 2022, her daughter has been concerned about dementia.   She has been having both visual and auditory hallucinations.  For example, she saw a large spider web in her house that she said was put up by somebody.   She sometimes hears people outside the window.  She also may exhibit paranoid delusions such as thinking her cousin is calling into radio stations and talking about her, or that her conversations are being broadcast throughout the apartment complex.  This isn't a frequent occurrence.  She sometimes has tremors of the hands.  She lives with her granddaughter and her daughter stops by everyday.  She is able to perform ADLs independently.  No family history of dementia.  B12 from November 2022 was 1,406.  TSH from January 2023 was 1.470.  24 hour ambulatory EEG on 4/5-04/30/2021 was normal.  No events captured.  Unable to get MRI of brain because she has reported abandoned leads.  CT head on 06/18/2021 and  07/14/2021 showed moderate chronic small vessel ischemic changes in the cerebral white matter but no acute findings.  Neuropsychological evaluation on 08/12/2021 was consistent with major neurocognitive disorder possibly indicating early stages of Lewy body dementia or vascular dementia but not suggestive of Alzheimer's, FTD or Parkinson's/atypical parkinson's etiology.  DaT scan performed on 11/11/2022 was within normal limits.  On 10/26/2022, she suddenly became confused and exhibited left sided facial droop.  Speech was altered.  Seen in [facility] ED.  CT head revealed moderate to severe chronic small vessel ischemic changes but no evidence of acute intracranial abnormality.  EEG revealed continuous left frontotemporal slowing with left anterior temporal sharp waves.  She was started on Keppra  and discharged.  She returned to the ED on 10/8 and was admitted for lower GI/rectal bleed.  Treated with Kcentra , vit K and received 3 units of PRBC.  Anticoagulation was discontinued with instructions to resume Eliquis  in 5 days.  Doing well now.  No recurrent spells.     PAST MEDICAL HISTORY: Past Medical History:  Diagnosis Date   ABLA (acute blood loss anemia) 07/04/2022   Achalasia    Acquired hypothyroidism 08/20/2020   Acute lower GI bleeding 11/02/2022   Acute metabolic encephalopathy 07/25/2021   AICD (automatic cardioverter/defibrillator) present 11/14/2019   Allergic rhinitis    Ankle fracture 03/25/2022   Aortic valve disorder 03/26/2002   Atherosclerosis of abdominal aorta 05/01/2020   Cardiomyopathy (HCC)    CHB (complete heart block) (HCC) 08/18/2017   CHF (congestive heart failure) (HCC)    Cholelithiasis without obstruction 05/01/2020   Chronic gouty arthritis    Chronic heart failure with preserved ejection fraction (HCC) 05/01/2020   Chronic kidney disease (CKD), active medical management without dialysis, stage 3 (moderate) (HCC) 05/01/2020   Closed torus fracture of distal end of right radius with delayed healing 08/12/2021   Delusional thoughts (HCC)    Diverticulosis of colon 05/01/2020   Epistaxis    Essential (primary) hypertension 08/18/2017   Gastroesophageal reflux disease 05/01/2020   Gastrointestinal hemorrhage    H/O transfusion of packed red blood cells  Hemorrhagic shock (HCC) 07/02/2022   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 11/14/2019   Hypercoagulability due to atrial fibrillation (HCC) 05/01/2020   Hyperlipidemia 11/14/2019   Hypocalcemia 12/14/2020   Hypoglycemia    Hypokalemia 12/14/2020   Hypomagnesemia 12/14/2020   Hypotension 12/14/2020   Idiopathic pulmonary fibrosis (HCC) 05/01/2020   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   Iron deficiency anemia    Long term (current) use of anticoagulants    Malnutrition of mild degree [name]: 75% to less than 90% of standard weight)    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/12/2021   Mitral and aortic incompetence 11/14/2019   Non-rheumatic atrial fibrillation (HCC) 05/01/2020   Non-toxic multinodular goiter 08/20/2020   NSVT (nonsustained  ventricular tachycardia) (HCC) 08/18/2017   Oropharyngeal dysphagia    Osteoarthritis of hip 05/01/2020   Osteoarthritis of knee 05/01/2020   Osteopenia of neck of left femur    Pain of left hip joint 12/12/2017   Presence of permanent cardiac pacemaker    Medtronic   Proteinuria 05/01/2020   Raynaud's disease    Recurrent falls 04/09/2021   Rheumatoid arthritis (HCC)    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     albuterol  (VENTOLIN  HFA) 108 (90 Base) MCG/ACT inhaler Inhale 2 puffs into the lungs every 6 (six) hours as needed for wheezing or shortness of breath. 2 each 6   allopurinol  (ZYLOPRIM ) 100 MG tablet TAKE 1 TABLET BY MOUTH EVERY DAY 90 tablet 3   apixaban  (ELIQUIS ) 2.5 MG TABS tablet Take 1 tablet (2.5 mg total) by mouth 2 (two) times daily. (Patient taking differently: Take 2.5 mg by mouth 2 (two) times daily. 6am, & 4 pm)     Budeson-Glycopyrrol-Formoterol  (BREZTRI  AEROSPHERE) 160-9-4.8 MCG/ACT AERO Inhale 2 puffs into the lungs 2 (two) times daily as needed (for flares). 1 each 11   calcium -vitamin D  (OSCAL WITH D) 500-5 MG-MCG tablet Take 2 tablets by mouth 2 (two) times daily. 120 tablet 0   cetirizine  HCl (ZYRTEC ) 5 MG/5ML SOLN Take 1 mL (1 mg total) by mouth 2 (two) times daily as needed for rhinitis or allergies.     Cholecalciferol  (VITAMIN D3) 25 MCG (1000 UT) CAPS Take 1,000 Units by mouth daily with lunch.     fluticasone   (FLONASE ) 50 MCG/ACT nasal spray Place 1 spray into both nostrils as needed for allergies or rhinitis.     furosemide  (LASIX ) 40 MG tablet Take 1.5 tablets (60 mg total) by mouth daily. (Patient taking differently: Take 40 mg by mouth daily.)     levETIRAcetam  (KEPPRA ) 500 MG tablet Take 1 tablet (500 mg total) by mouth 2 (two) times daily. 60 tablet 5   magnesium  oxide (MAG-OX) 400 (240 Mg) MG tablet TAKE 1 TABLET BY MOUTH EVERY DAY 90 tablet 3   Nintedanib  (OFEV ) 150 MG CAPS Take 1 capsule (150 mg total) by mouth at bedtime. 30 capsule 2   OLANZapine  (ZYPREXA ) 2.5 MG  tablet TAKE 1 TABLET BY MOUTH EVERYDAY AT BEDTIME 90 tablet 1   pantoprazole  (PROTONIX ) 40 MG tablet Take 1 tablet (40 mg total) by mouth 2 (two) times daily. 60 tablet 1   polyethylene glycol powder (MIRALAX ) 17 GM/SCOOP powder Take 17 g by mouth daily as needed for mild constipation.     potassium chloride  (KLOR-CON  M) 10 MEQ tablet Take 2 tablets (20 mEq total) by mouth daily. Further refill requests for this medication need to be approved by PCP. (Patient taking differently: Take 20 mEq by mouth 2 (two) times daily. Further refill requests for this medication need to be approved by PCP.) 60 tablet 0   predniSONE  (DELTASONE ) 5 MG tablet Take 1 tablet (5 mg total) by mouth daily with breakfast. Patient to double up dose during sick day rule 100 tablet 3   No current facility-administered medications on file prior to visit.    ALLERGIES: Allergies  Allergen Reactions   Diprivan  [Propofol ] Shortness Of Breath and Other (See Comments)    Caused asthma   Other Other (See Comments)    Patient is to NOT EAT any foods with husks or tree nuts, popcorn   Tambocor [Flecainide] Other (See Comments)    Unknown reaction   Norvasc [Amlodipine] Other (See Comments)    Fatigue Syncope   Pacerone [Amiodarone] Other (See Comments)    Delirium Confusion Psychosis    FAMILY HISTORY: Family History  Problem Relation Age of Onset    Heart disease Mother    Hypertension Mother    Rheum arthritis Mother    Osteoarthritis Mother    Heart disease Father    Hypertension Father    Osteoarthritis Father    Heart disease Sister    Diabetes Brother    Cancer Brother       Objective:  Blood pressure 136/77, pulse 82, height 5' 2 (1.575 m), weight 111 lb 3.2 oz (50.4 kg), SpO2 100%. General: No acute distress.  Patient appears well-groomed.   Head:  Normocephalic/atraumatic Neck:  Supple.  No paraspinal tenderness.  Full range of motion. Heart:  Regular rate and rhythm. Neuro:  Alert and oriented.  Speech fluent and not dysarthric.  Language intact.  CN II-XII intact.  Increased tone in right wrist and elbow. Muscle strength 5-/5 right hip flexion otherwise 5/5 throughout.  Sensation to light touch intact.  Deep tendon reflexes 1+ throughout.  Requires upper body to stand.  Cautious broad-based gait with short stride.  Ambulates with rollator.  Juliene Dunnings, DO  CC: [place]  [name], [name]

## 2023-12-27 ENCOUNTER — Ambulatory Visit (INDEPENDENT_AMBULATORY_CARE_PROVIDER_SITE_OTHER): Admitting: Neurology

## 2023-12-27 ENCOUNTER — Encounter: Payer: Self-pay | Admitting: Neurology

## 2023-12-27 VITALS — BP 136/77 | HR 82 | Ht 62.0 in | Wt 111.2 lb

## 2023-12-27 DIAGNOSIS — F01B18 Vascular dementia, moderate, with other behavioral disturbance: Secondary | ICD-10-CM

## 2023-12-27 DIAGNOSIS — G40209 Localization-related (focal) (partial) symptomatic epilepsy and epileptic syndromes with complex partial seizures, not intractable, without status epilepticus: Secondary | ICD-10-CM | POA: Diagnosis not present

## 2023-12-27 MED ORDER — LEVETIRACETAM 500 MG PO TABS: 500.0000 mg | ORAL_TABLET | Freq: Two times a day (BID) | ORAL | 1 refills | Status: AC

## 2023-12-27 MED ORDER — OLANZAPINE 2.5 MG PO TABS: ORAL_TABLET | ORAL | 1 refills | Status: AC

## 2023-12-27 NOTE — Patient Instructions (Signed)
 Levetiracetam  500mg  twice daily Olanzapine  2.5mg  at bedtime

## 2023-12-30 ENCOUNTER — Telehealth: Payer: Self-pay | Admitting: Cardiology

## 2023-12-30 DIAGNOSIS — I4821 Permanent atrial fibrillation: Secondary | ICD-10-CM

## 2023-12-30 MED ORDER — APIXABAN 2.5 MG PO TABS: 2.5000 mg | ORAL_TABLET | Freq: Two times a day (BID) | ORAL | 3 refills | Status: AC

## 2023-12-30 NOTE — Telephone Encounter (Signed)
*  STAT* If patient is at the pharmacy, call can be transferred to refill team.   1. Which medications need to be refilled? (please list name of each medication and dose if known)   apixaban  (ELIQUIS ) 2.5 MG TABS tablet    2. Which pharmacy/location (including street and city if local pharmacy) is medication to be sent to?  [id number] - [place], [place] - [address]      3. Do they need a 30 day or 90 day supply? 90 day

## 2023-12-30 NOTE — Telephone Encounter (Signed)
 Eliquis  2.5mg  refill request received. Patient is 78 years old, weight-50.4kg, Crea-1.17 on 12/21/23, Diagnosis-Afib, and last seen by Harlene Ganong on 10/12/23 and pending appt with Dr, Cherrie on 01/06/24.   Per last OV note it states On Eliquis  2.5 mg bid, reduced dose, presumably for GI bleeding risk. No bleeding issues. May need to consider discontinuation of AC with frequent falls and GIB Will send in refill to requested pharmacy.

## 2024-01-06 ENCOUNTER — Inpatient Hospital Stay (HOSPITAL_COMMUNITY): Admission: RE | Admit: 2024-01-06 | Discharge: 2024-01-06 | Attending: Internal Medicine | Admitting: Internal Medicine

## 2024-01-06 ENCOUNTER — Ambulatory Visit (HOSPITAL_COMMUNITY)
Admission: RE | Admit: 2024-01-06 | Discharge: 2024-01-06 | Disposition: A | Source: Ambulatory Visit | Attending: Internal Medicine | Admitting: Internal Medicine

## 2024-01-06 ENCOUNTER — Encounter (HOSPITAL_COMMUNITY): Payer: Self-pay | Admitting: Internal Medicine

## 2024-01-06 VITALS — BP 150/80 | HR 70 | Wt 116.8 lb

## 2024-01-06 DIAGNOSIS — I4821 Permanent atrial fibrillation: Secondary | ICD-10-CM | POA: Diagnosis not present

## 2024-01-06 DIAGNOSIS — I495 Sick sinus syndrome: Secondary | ICD-10-CM | POA: Diagnosis not present

## 2024-01-06 DIAGNOSIS — N183 Chronic kidney disease, stage 3 unspecified: Secondary | ICD-10-CM | POA: Diagnosis not present

## 2024-01-06 DIAGNOSIS — R634 Abnormal weight loss: Secondary | ICD-10-CM | POA: Diagnosis not present

## 2024-01-06 DIAGNOSIS — I130 Hypertensive heart and chronic kidney disease with heart failure and stage 1 through stage 4 chronic kidney disease, or unspecified chronic kidney disease: Secondary | ICD-10-CM | POA: Diagnosis not present

## 2024-01-06 DIAGNOSIS — M3219 Other organ or system involvement in systemic lupus erythematosus: Secondary | ICD-10-CM | POA: Diagnosis not present

## 2024-01-06 DIAGNOSIS — Z7901 Long term (current) use of anticoagulants: Secondary | ICD-10-CM | POA: Diagnosis not present

## 2024-01-06 DIAGNOSIS — I5032 Chronic diastolic (congestive) heart failure: Secondary | ICD-10-CM

## 2024-01-06 DIAGNOSIS — Z7952 Long term (current) use of systemic steroids: Secondary | ICD-10-CM | POA: Diagnosis not present

## 2024-01-06 DIAGNOSIS — I959 Hypotension, unspecified: Secondary | ICD-10-CM | POA: Diagnosis not present

## 2024-01-06 DIAGNOSIS — I2721 Secondary pulmonary arterial hypertension: Secondary | ICD-10-CM

## 2024-01-06 DIAGNOSIS — M3213 Lung involvement in systemic lupus erythematosus: Secondary | ICD-10-CM | POA: Diagnosis not present

## 2024-01-06 DIAGNOSIS — M3214 Glomerular disease in systemic lupus erythematosus: Secondary | ICD-10-CM | POA: Diagnosis not present

## 2024-01-06 DIAGNOSIS — Z8719 Personal history of other diseases of the digestive system: Secondary | ICD-10-CM | POA: Diagnosis not present

## 2024-01-06 DIAGNOSIS — Z953 Presence of xenogenic heart valve: Secondary | ICD-10-CM | POA: Diagnosis not present

## 2024-01-06 DIAGNOSIS — E2749 Other adrenocortical insufficiency: Secondary | ICD-10-CM | POA: Diagnosis not present

## 2024-01-06 DIAGNOSIS — J8417 Interstitial lung disease with progressive fibrotic phenotype in diseases classified elsewhere: Secondary | ICD-10-CM | POA: Diagnosis not present

## 2024-01-06 DIAGNOSIS — I10 Essential (primary) hypertension: Secondary | ICD-10-CM | POA: Diagnosis not present

## 2024-01-06 DIAGNOSIS — G4733 Obstructive sleep apnea (adult) (pediatric): Secondary | ICD-10-CM | POA: Diagnosis not present

## 2024-01-06 DIAGNOSIS — I272 Pulmonary hypertension, unspecified: Secondary | ICD-10-CM | POA: Diagnosis not present

## 2024-01-06 DIAGNOSIS — Z6821 Body mass index (BMI) 21.0-21.9, adult: Secondary | ICD-10-CM | POA: Diagnosis not present

## 2024-01-06 DIAGNOSIS — Z4502 Encounter for adjustment and management of automatic implantable cardiac defibrillator: Secondary | ICD-10-CM | POA: Diagnosis not present

## 2024-01-06 DIAGNOSIS — I082 Rheumatic disorders of both aortic and tricuspid valves: Secondary | ICD-10-CM | POA: Diagnosis not present

## 2024-01-06 DIAGNOSIS — Z635 Disruption of family by separation and divorce: Secondary | ICD-10-CM | POA: Diagnosis not present

## 2024-01-06 DIAGNOSIS — R54 Age-related physical debility: Secondary | ICD-10-CM | POA: Diagnosis not present

## 2024-01-06 DIAGNOSIS — Z79899 Other long term (current) drug therapy: Secondary | ICD-10-CM | POA: Diagnosis not present

## 2024-01-06 LAB — ECHOCARDIOGRAM COMPLETE
AR max vel: 0.71 cm2
AV Area VTI: 0.76 cm2
AV Area mean vel: 0.72 cm2
AV Mean grad: 27 mmHg
AV Peak grad: 52.4 mmHg
Ao pk vel: 3.62 m/s
Area-P 1/2: 3.65 cm2
Calc EF: 47.2 %
S' Lateral: 2.6 cm
Single Plane A2C EF: 48.8 %
Single Plane A4C EF: 45.9 %

## 2024-01-06 MED ORDER — FUROSEMIDE 20 MG PO TABS: 20.0000 mg | ORAL_TABLET | Freq: Every day | ORAL | 6 refills | Status: DC

## 2024-01-06 MED ORDER — FUROSEMIDE 40 MG PO TABS: 40.0000 mg | ORAL_TABLET | Freq: Every day | ORAL | 6 refills | Status: DC

## 2024-01-06 NOTE — Progress Notes (Signed)
°  Echocardiogram 2D Echocardiogram has been performed.  Norleen ORN Heiley Shaikh 01/06/2024, 2:40 PM

## 2024-01-06 NOTE — Progress Notes (Signed)
 [facility] NOTE Primary Care: Lindsey, [name], PA EP Cardiologist: Dr. Cindie HF Cardiologist: Dr Vanda  HPI: Lindsey Huber is a 78 y.o. female with chronic diastolic CHF, permanent AF, AFL s/p ablation, SLE, pulmonary fibrosis, pulmonary hypertension. paroxysmal VT, OSA not on cpap and valvular disease s/p AVR/MV ring/TVR. Referred by Dr. Cindie in 10/22 for further evaluation of PAH and valvular heart disease.  Underwent  AVR/MV ring/TVR 09/25/2010.  Paroxysmal VT, ICD placed due to torsades on Flecainide with SSS requiring PPM which predated valve repar/replacement surgery 09/25/2010 and in fact ICD lead was damaged during valve replacement surgery.       History of chronic COPD and pulmonary fibrosis followed by pulmonology.  On triple therapy for COPD vs asthma, nintedanib  for IPF. Followed by Dr Lindsey Huber, Did not smoke but inhaled second hand smoke from husband and mother pretty much her whole life until 01/2020.   Echo 11/20: EF 63%,normal bioprosthetic tricuspid and aortic valve with mild tricuspid regurgitation, mitral valve repair with mild mitral regurgitation, severe biatrial enlargement.   Echo 03/11/2020  LVEF 55 to 60%, moderate to severe tricuspid regurgitation, moderately elevated right ventricular systolic pressure estimate 51mm Hg, moderate mitral regurgitation.  RV not well visualized.    Seen in HF Clinic for first visit 10/27/20. Had NYHA IIIB symptoms. Referred for R/L cath--> normal coronaries, EF 60-65%, and normal left sided filling pressures.   R/LHC 11/27/20: Ao =129/63 (89) LV = 141/8 RA = 11 (v waves to 14) RV = 43/4 PA = 42/15 (25) PCW = 10 Fick cardiac output/index = 5.7/3.7 PVR = 2.1 WU SVR = 1101 PAPi = 2.45 WU. Normal coronary arteries, EF 60-65%, very mild PAH. Normal left-sided filling pressures with no significant v-waves in PCWP tracing to suggest hemodynamically significant MR. No significant stenosis across AoV, MV or TV  Admitted 11/22  with acute LGIB. CT scan showed acute GI hemorrhage involving mid descending colon, likely acute diverticular bleed. Transfused 3u RBCs. Colonoscopy 11/26 which noted hemorrhoids and diverticulosis, no active bleeding, recommended to restart Xarelto  in 3 days if stable.   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  Admitted 3/25 with GIB 2/2 diverticular bleeding. AC held then restarted. Re-admitted later in the month with GI bleeding, AC held agin. Underwent colonoscopy showing hemorrhoids/diverticulosis-no active bleeding.  AC restarted.  Admitted 11/25 with recurrent GIB. EGD , showed medium size hiatal hernia, acute gastritis that was biopsied. Hgb ok   Today she returns for HF follow up with her granddaughter.  Says she feels pretty good but gets SOB with walking. + ankle edema  Echo today 01/06/24  LVEF 55% RV dilated Mod AS (mean gradient 32) Marked biatrial dilation Personally reviewed  Past Medical History:  Diagnosis Date   ABLA (acute blood loss anemia) 07/04/2022   Achalasia    Acquired hypothyroidism 08/20/2020   Acute lower GI bleeding 11/02/2022   Acute metabolic encephalopathy 07/25/2021   AICD (automatic cardioverter/defibrillator) present 11/14/2019   Allergic rhinitis    Ankle fracture 03/25/2022   Aortic valve disorder 03/26/2002   Atherosclerosis of abdominal aorta 05/01/2020   Cardiomyopathy (HCC)    CHB (complete heart block) (HCC) 08/18/2017   CHF (congestive heart failure) (HCC)    Cholelithiasis without obstruction 05/01/2020   Chronic gouty arthritis    Chronic heart failure with preserved ejection fraction (HCC) 05/01/2020   Chronic kidney disease (  CKD), active medical management without dialysis, stage 3 (moderate) (HCC) 05/01/2020   Closed torus fracture of distal end of right radius with delayed healing 08/12/2021   Delusional thoughts (HCC)     Diverticulosis of colon 05/01/2020   Epistaxis    Essential (primary) hypertension 08/18/2017   Gastroesophageal reflux disease 05/01/2020   Gastrointestinal hemorrhage    H/O transfusion of packed red blood cells    Hemorrhagic shock (HCC) 07/02/2022   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 11/14/2019   Hypercoagulability due to atrial fibrillation (HCC) 05/01/2020   Hyperlipidemia 11/14/2019   Hypocalcemia 12/14/2020   Hypoglycemia    Hypokalemia 12/14/2020   Hypomagnesemia 12/14/2020   Hypotension 12/14/2020   Idiopathic pulmonary fibrosis (HCC) 05/01/2020   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   Iron deficiency anemia    Long term (current) use of anticoagulants    Malnutrition of mild degree [name]: 75% to less than 90% of standard weight)    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/12/2021   Mitral and aortic incompetence 11/14/2019   Non-rheumatic atrial fibrillation (HCC) 05/01/2020   Non-toxic multinodular goiter 08/20/2020   NSVT (nonsustained ventricular tachycardia) (HCC) 08/18/2017   Oropharyngeal dysphagia    Osteoarthritis of hip 05/01/2020   Osteoarthritis of knee 05/01/2020   Osteopenia of neck of left femur    Pain of left hip joint 12/12/2017   Presence of permanent cardiac pacemaker    [employer]tr[employer]   Proteinuria 05/01/2020   Raynaud's disease    Recurrent falls 04/09/2021   Rheumatoid arthritis (HCC)    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      albuterol  (VENTOLIN  HFA) 108 (90 Base) MCG/ACT inhaler Inhale 2 puffs into the lungs every 6 (six) hours as needed for wheezing or shortness of breath. 2 each 6   allopurinol  (ZYLOPRIM ) 100 MG tablet TAKE 1 TABLET BY MOUTH EVERY DAY 90 tablet 3   apixaban  (ELIQUIS ) 2.5 MG TABS tablet Take 1 tablet (2.5 mg total) by mouth 2 (two) times daily. 180 tablet 3   Budeson-Glycopyrrol-Formoterol  (BREZTRI  AEROSPHERE) 160-9-4.8 MCG/ACT AERO Inhale 2 puffs into the lungs 2 (two) times daily as needed (for flares). 1 each 11   calcium -vitamin D  (OSCAL WITH D) 500-5 MG-MCG tablet Take 2 tablets by mouth 2 (two) times daily. 120 tablet 0   cetirizine  HCl (ZYRTEC ) 5 MG/5ML SOLN Take 1 mL (1 mg total) by mouth 2 (two) times daily as needed for rhinitis or allergies.     Cholecalciferol  (VITAMIN D3) 25 MCG (1000 UT) CAPS Take 1,000 Units by mouth daily with lunch.     fluticasone  (FLONASE ) 50 MCG/ACT nasal spray Place 1 spray into both nostrils as needed for allergies or rhinitis.     furosemide  (LASIX ) 40 MG tablet Take 1.5 tablets (60 mg total) by mouth daily.     levETIRAcetam  (KEPPRA ) 500 MG tablet Take 1 tablet (500 mg total) by mouth 2 (two) times daily. 180 tablet 1   magnesium  oxide (MAG-OX)  400 (240 Mg) MG tablet TAKE 1 TABLET BY MOUTH EVERY DAY 90 tablet 3   Nintedanib  (OFEV ) 150 MG CAPS Take 1 capsule (150 mg total) by mouth at bedtime. 30 capsule 2   OLANZapine  (ZYPREXA ) 2.5 MG tablet TAKE 1 TABLET BY MOUTH EVERYDAY AT BEDTIME 90 tablet 1   pantoprazole  (PROTONIX ) 40 MG tablet Take 1 tablet (40 mg total) by mouth 2 (two) times daily. 60 tablet 1   polyethylene glycol powder (MIRALAX ) 17 GM/SCOOP powder Take 17 g by mouth daily as needed for mild constipation.     potassium chloride  (KLOR-CON  M) 10 MEQ tablet Take 2 tablets (20 mEq total) by mouth daily. Further refill requests for this medication need to be approved by PCP. 60 tablet 0   predniSONE  (DELTASONE ) 5 MG tablet Take 1 tablet (5 mg total) by  mouth daily with breakfast. Patient to double up dose during sick day rule 100 tablet 3   No current facility-administered medications for this encounter.   Allergies  Allergen Reactions   Diprivan  [Propofol ] Shortness Of Breath and Other (See Comments)    Caused asthma   Other Other (See Comments)    Patient is to NOT EAT any foods with husks or tree nuts, popcorn   Tambocor [Flecainide] Other (See Comments)    Unknown reaction   Norvasc [Amlodipine] Other (See Comments)    Fatigue Syncope   Pacerone [Amiodarone] Other (See Comments)    Delirium Confusion Psychosis   Social History   Socioeconomic History   Marital status: Legally Separated    Spouse name: Not on file   Number of children: Not on file   Years of education: 16   Highest education level: Bachelor's degree (Huber.g., BA, AB, BS)  Occupational History   Occupation: 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  Tobacco Use   Smoking status: Never    Passive exposure: Past   Smokeless tobacco: Never  Vaping Use   Vaping status: Never Used  Substance and Sexual Activity   Alcohol  use: Never   Drug use: Never   Sexual activity: Not Currently  Other Topics Concern   Not on file  Social History Narrative   Right Handed    Social Drivers of Health   Tobacco Use: Low Risk (01/06/2024)   Patient History    Smoking Tobacco Use: Never    Smokeless Tobacco Use: Never    Passive Exposure: Past  Financial Resource Strain: Not on file  Food Insecurity: No Food Insecurity (12/19/2023)   Epic    Worried About Programme Researcher, Broadcasting/film/video in the Last Year: Never true    Ran Out of Food in the Last Year: Never true  Transportation Needs: No Transportation Needs (12/19/2023)   Epic    Lack of Transportation (Medical): No    Lack of Transportation (Non-Medical): No  Physical Activity: Not on file  Stress: Not on file  Social Connections: Socially Isolated (12/19/2023)   Social Connection and Isolation Panel    Frequency of  Communication with Friends and Family: More than three times a week    Frequency of Social Gatherings with Friends and Family: More than three times a week    Attends Religious Services: Never    Database Administrator or Organizations: No    Attends Banker Meetings: Never    Marital Status: Separated  Intimate Partner Violence: Not At Risk (12/19/2023)   Epic    Fear of Current or Ex-Partner: No    Emotionally  Abused: No    Physically Abused: No    Sexually Abused: No  Depression (PHQ2-9): Not on file  Alcohol  Screen: Not on file  Housing: Low Risk (12/19/2023)   Epic    Unable to Pay for Housing in the Last Year: No    Number of Times Moved in the Last Year: 0    Homeless in the Last Year: No  Utilities: Not At Risk (12/19/2023)   Epic    Threatened with loss of utilities: No  Health Literacy: Not on file   Family History  Problem Relation Age of Onset   Heart disease Mother    Hypertension Mother    Rheum arthritis Mother    Osteoarthritis Mother    Heart disease Father    Hypertension Father    Osteoarthritis Father    Heart disease Sister    Diabetes Brother    Cancer Brother    BP (!) 150/80   Pulse 70   Wt 53 kg (116 lb 12.8 oz)   SpO2 97%   BMI 21.36 kg/m   Wt Readings from Last 3 Encounters:  01/06/24 53 kg (116 lb 12.8 oz)  12/27/23 50.4 kg (111 lb 3.2 oz)  12/18/23 49.9 kg (110 lb)   PHYSICAL EXAM: General:  Elderly frail pale No resp difficulty HEENT: normal Neck: supple. JVP to ear with prominent v-waves Cor: Regular rate & rhythm. 3/6 SEM RUSB 3/6 sem LUSB Lungs: clear Abdomen: soft, nontender, nondistended.Good bowel sounds. Extremities: no cyanosis, clubbing, rash, 2+ edema Neuro: alert & orientedx3, cranial nerves grossly intact. moves all 4 extremities w/o difficulty. Affect pleasant    ICD interrogation (personally reviewed): 100% RV paced OptiVol way up. No VT. Activity level ~0 Personally reviewed   ECG (personally  reviewed): n/a   ASSESSMENT & PLAN: Chronic Diastolic CHF due to valvular heart disease - s/p bioprosthetic AVR/MV ring/bioprosthetic TVR 09/25/2010 - Echo 03/11/2020  LVEF 55 to 60%, moderate to severe tricuspid regurgitation, moderately elevated right ventricular systolic pressure estimate 51mm Hg, moderate mitral regurgitation.  RV not well visualized. AV function not assessed. - RHC 11/22: Mild PAH. RA 11 (v waves to 14) PA 42/15 (25) PCWP 10 , CO/CI (Fick)  5.7/3.7, PVR 2.1 WU MVA (LV-wedge tracing) = 3.5 cm2 mean gradient across MV 3.4 mm HG - Echo 2/24: EF 50-55%, moderate to severe prosthetic valve AS (mean 35), moderate to severe TS (mean 10) with mild to moderate TR, mild MR/MS - Echo 10/24: EF 60-65%, RV moderately reduced, moderate to severe TR, AoV mean gradient 32 mm Hg -  Echo today 01/06/24  LVEF 55% RV dilated Mod AS (mean gradient 32) Marked biatrial dilation Personally reviewed - NYHA IIIB chronically - Volume up in setting of recent admission  - Will increase lasix  from 60 daily to 60 bid for 3 days and reassess next week. Suspect she may need 60/40 to keep her dry - Off Toprol  with low BP - Echo with stable prosthetic AC. Not candidate for intervention - She is volume overloaded again. Double lasix  for 3 days and see back next week.   2. Pulmonary HTN by ECHO - RHC 11/22 with mild PAH and normal PVR. 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 ILD - 11/2018 HRCT with stable ILD compared with 2018.  2018 CT Slowly progressive basal predominant, moderately severe pulmonary fibrosis. Favor NSIP - Followed by pulmonary   - No change.  4. OSA - Off CPAP  with weight loss  5. Permanent Afib with SSS - has ICD in place due to h/o VT  - Followed by Dr. Cindie - On Eliquis  2.5 mg bid, reduced dose, presumably for GI bleeding risk. No bleeding issues - May need to consider discontinuation of AC with frequent falls and recurrent  GIB  6. H/o VT - s/p ICD  - Device interrogated today.  7. GIB - Colonoscopy 2022: Left diverticulosis - Colonoscopy 3/25: no active bleeding, suspect bleeding from colonic diverticula - 2 admission in 2025 for bleeds - Followed by GI  8. SLE - Followed by Dr. Jeannetta, lupus felt to be under good control - Continued on HCQ and she is also on 5 mg prednisone  for secondary adrenal insufficiency  9. HTN - BP slightly elevated. Follow with diuresis  Toribio Fuel, MD  2:55 PM  01/06/2024   [facility] [address] [facility] [place] [address] [phone] (office) [phone] (fax)

## 2024-01-06 NOTE — Patient Instructions (Signed)
 Medication Changes:  INCREASE Furosemide  to 60 mg Twice daily FOR 3 DAYS ONLY, then back to 60 mg Daily  **we have sent you in a prescription for Furosemide  20 mg tabs to take along with your 40 mg tabs  Special Instructions // Education:  Do the following things EVERYDAY: Weigh yourself in the morning before breakfast. Write it down and keep it in a log. Take your medicines as prescribed Eat low salt foods--Limit salt (sodium) to 2000 mg per day.  Stay as active as you can everyday Limit all fluids for the day to less than 2 liters   Follow-Up in: 1 week   At the [facility], you and your health needs are our priority. We have a designated team specialized in the treatment of Heart Failure. This Care Team includes your primary Heart Failure Specialized Cardiologist (physician), Advanced Practice Providers (APPs- Physician Assistants and Nurse Practitioners), and Pharmacist who all work together to provide you with the care you need, when you need it.   You may see any of the following providers on your designated Care Team at your next follow up:  Dr. Toribio Fuel Dr. Ezra Shuck Dr. Odis Brownie Greig Mosses, NP Caffie Shed, [name] [facility] [place], [name] Beckey Coe, NP Jordan Lee, NP Tinnie Redman, PharmD   Please be sure to bring in all your medications bottles to every appointment.   Need to Contact Us :  If you have any questions or concerns before your next appointment please send us  a message through [place] or call our office at [phone].    TO LEAVE A MESSAGE FOR THE NURSE SELECT OPTION 2, PLEASE LEAVE A MESSAGE INCLUDING: YOUR NAME DATE OF BIRTH CALL BACK NUMBER REASON FOR CALL**this is important as we prioritize the call backs  YOU WILL RECEIVE A CALL BACK THE SAME DAY AS LONG AS YOU CALL BEFORE 4:00 PM

## 2024-01-11 ENCOUNTER — Telehealth (HOSPITAL_COMMUNITY): Payer: Self-pay

## 2024-01-11 NOTE — Telephone Encounter (Signed)
 Called and spoke to pt's Daughter Lindsey Huber to confirm/remind patient of their appointment at the [facility] on 01/12/24.   Appointment:   [x] Confirmed  [] Left mess   [] No answer/No voice mail  [] VM Full/unable to leave message  [] Phone not in service  Patient reminded to bring all medications and/or complete list.  Confirmed patient has transportation. Gave directions, instructed to utilize valet parking.

## 2024-01-11 NOTE — Progress Notes (Signed)
 [facility]ED [facility] CLINIC NOTE Primary Care: [name], [name], PA EP Cardiologist: Dr. Cindie HF Cardiologist: Dr Vanda  HPI: Ms. Greenlaw is a 78 y.o. female with chronic diastolic CHF, permanent AF, AFL s/p ablation, SLE, pulmonary fibrosis, pulmonary hypertension. paroxysmal VT, OSA not on cpap and valvular disease s/p AVR/MV ring/TVR. Referred by Dr. Cindie in 10/22 for further evaluation of PAH and valvular heart disease.  Underwent  AVR/MV ring/TVR 09/25/2010.  Paroxysmal VT, ICD placed due to torsades on Flecainide with SSS requiring PPM which predated valve repar/replacement surgery 09/25/2010 and in fact ICD lead was damaged during valve replacement surgery.       History of chronic COPD and pulmonary fibrosis followed by pulmonology.  On triple therapy for COPD vs asthma, nintedanib  for IPF. Followed by Dr Annella, Did not smoke but inhaled second hand smoke from husband and mother pretty much her whole life until 01/2020.   Echo 11/20: EF 63%,normal bioprosthetic tricuspid and aortic valve with mild tricuspid regurgitation, mitral valve repair with mild mitral regurgitation, severe biatrial enlargement.   Echo 03/11/2020  LVEF 55 to 60%, moderate to severe tricuspid regurgitation, moderately elevated right ventricular systolic pressure estimate 51mm Hg, moderate mitral regurgitation.  RV not well visualized.    Seen in HF Clinic for first visit 10/27/20. Had NYHA IIIB symptoms. Referred for R/L cath--> normal coronaries, EF 60-65%, and normal left sided filling pressures.   R/LHC 11/27/20: Ao =129/63 (89) LV = 141/8 RA = 11 (v waves to 14) RV = 43/4 PA = 42/15 (25) PCW = 10 Fick cardiac output/index = 5.7/3.7 PVR = 2.1 WU SVR = 1101 PAPi = 2.45 WU. Normal coronary arteries, EF 60-65%, very mild PAH. Normal left-sided filling pressures with no significant v-waves in PCWP tracing to suggest hemodynamically significant MR. No significant stenosis across AoV, MV or TV  Admitted 11/22  with acute LGIB. CT scan showed acute GI hemorrhage involving mid descending colon, likely acute diverticular bleed. Transfused 3u RBCs. Colonoscopy 11/26 which noted hemorrhoids and diverticulosis, no active bleeding, recommended to restart Xarelto  in 3 days if stable.   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  Admitted 3/25 with GIB 2/2 diverticular bleeding. AC held then restarted. Re-admitted later in the month with GI bleeding, AC held agin. Underwent colonoscopy showing hemorrhoids/diverticulosis-no active bleeding.  AC restarted.  Admitted 11/25 with recurrent GIB. EGD showed medium size hiatal hernia, acute gastritis that was biopsied. Hgb ok   Echo 12/25  LVEF 55% RV dilated Mod AS (mean gradient 32) Marked biatrial dilation   She returns today for HF follow up with her granddaughter. Overall feeling well. SOB and ankle edema have been improving. Has had a nose bleed recently, that resolved quickly. No dizziness. sBP at home 130-150s. Compliant with all medications.  Past Medical History:  Diagnosis Date   ABLA (acute blood loss anemia) 07/04/2022   Achalasia    Acquired hypothyroidism 08/20/2020   Acute lower GI bleeding 11/02/2022   Acute metabolic encephalopathy 07/25/2021   AICD (automatic cardioverter/defibrillator) present 11/14/2019   Allergic rhinitis    Ankle fracture 03/25/2022   Aortic valve disorder 03/26/2002   Atherosclerosis of abdominal aorta 05/01/2020   Cardiomyopathy (HCC)    CHB (complete heart block) (HCC) 08/18/2017   CHF (congestive heart failure) (HCC)    Cholelithiasis without obstruction 05/01/2020   Chronic gouty arthritis    Chronic heart  failure with preserved ejection fraction (HCC) 05/01/2020   Chronic kidney disease (CKD), active medical management without dialysis, stage 3 (moderate) (HCC) 05/01/2020   Closed torus fracture of distal end of  right radius with delayed healing 08/12/2021   Delusional thoughts (HCC)    Diverticulosis of colon 05/01/2020   Epistaxis    Essential (primary) hypertension 08/18/2017   Gastroesophageal reflux disease 05/01/2020   Gastrointestinal hemorrhage    H/O transfusion of packed red blood cells    Hemorrhagic shock (HCC) 07/02/2022   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 11/14/2019   Hypercoagulability due to atrial fibrillation (HCC) 05/01/2020   Hyperlipidemia 11/14/2019   Hypocalcemia 12/14/2020   Hypoglycemia    Hypokalemia 12/14/2020   Hypomagnesemia 12/14/2020   Hypotension 12/14/2020   Idiopathic pulmonary fibrosis (HCC) 05/01/2020   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   Iron deficiency anemia    Long term (current) use of anticoagulants    Malnutrition of mild degree [name]: 75% to less than 90% of standard weight)    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/12/2021   Mitral and aortic incompetence 11/14/2019   Non-rheumatic atrial fibrillation (HCC) 05/01/2020   Non-toxic multinodular goiter 08/20/2020   NSVT (nonsustained ventricular tachycardia) (HCC) 08/18/2017   Oropharyngeal dysphagia    Osteoarthritis of hip 05/01/2020   Osteoarthritis of knee 05/01/2020   Osteopenia of neck of left femur    Pain of left hip joint 12/12/2017   Presence of permanent cardiac pacemaker    [employer]tr[employer]   Proteinuria 05/01/2020   Raynaud's disease    Recurrent falls 04/09/2021   Rheumatoid arthritis (HCC)    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     albuterol  (VENTOLIN  HFA) 108 (90 Base) MCG/ACT inhaler Inhale 2 puffs into the lungs every 6 (six) hours as needed for wheezing or shortness of breath. 2 each 6   allopurinol  (ZYLOPRIM ) 100 MG tablet TAKE 1 TABLET BY MOUTH EVERY DAY 90 tablet 3   apixaban  (ELIQUIS ) 2.5 MG TABS tablet Take 1 tablet (2.5 mg total) by mouth 2 (two) times daily. 180 tablet 3   Budeson-Glycopyrrol-Formoterol  (BREZTRI  AEROSPHERE) 160-9-4.8 MCG/ACT AERO Inhale 2 puffs into the lungs 2 (two) times daily as needed (for flares). 1 each 11   calcium -vitamin D  (OSCAL WITH D) 500-5 MG-MCG tablet Take 2 tablets by mouth 2 (two) times daily. 120 tablet 0   cetirizine  HCl (ZYRTEC ) 5 MG/5ML SOLN Take 1 mL (1 mg total) by mouth 2 (two) times daily as needed for rhinitis or allergies.     Cholecalciferol  (VITAMIN D3) 25 MCG (1000 UT) CAPS Take 1,000 Units by mouth daily with lunch.     fluticasone  (FLONASE ) 50 MCG/ACT nasal spray Place 1 spray into both nostrils as needed for allergies or rhinitis.     furosemide  (LASIX ) 20 MG tablet Take 1 tablet (20 mg total) by mouth daily. Take along with 40 mg tab to equal 60 mg 30 tablet 6   furosemide  (LASIX )  40 MG tablet Take 1 tablet (40 mg total) by mouth daily. Take along with 20 mg tabs to equal 60 mg 30 tablet 6   levETIRAcetam  (KEPPRA ) 500 MG tablet Take 1 tablet (500 mg total) by mouth 2 (two) times daily. 180 tablet 1   magnesium  oxide (MAG-OX) 400 (240 Mg) MG tablet TAKE 1 TABLET BY MOUTH EVERY DAY 90 tablet 3   Nintedanib  (OFEV ) 150 MG CAPS Take 1 capsule (150 mg total) by mouth at bedtime. 30 capsule 2   OLANZapine  (ZYPREXA ) 2.5 MG tablet TAKE 1 TABLET BY MOUTH EVERYDAY AT BEDTIME 90 tablet 1   pantoprazole  (PROTONIX ) 40 MG tablet Take 1 tablet (40 mg total) by mouth 2 (two) times daily. 60 tablet 1   polyethylene glycol powder (MIRALAX ) 17 GM/SCOOP powder Take 17 g by mouth daily as needed for mild  constipation.     potassium chloride  (KLOR-CON  M) 10 MEQ tablet Take 2 tablets (20 mEq total) by mouth daily. Further refill requests for this medication need to be approved by PCP. 60 tablet 0   predniSONE  (DELTASONE ) 5 MG tablet Take 1 tablet (5 mg total) by mouth daily with breakfast. Patient to double up dose during sick day rule 100 tablet 3   No current facility-administered medications for this visit.   Allergies  Allergen Reactions   Diprivan  [Propofol ] Shortness Of Breath and Other (See Comments)    Caused asthma   Other Other (See Comments)    Patient is to NOT EAT any foods with husks or tree nuts, popcorn   Tambocor [Flecainide] Other (See Comments)    Unknown reaction   Norvasc [Amlodipine] Other (See Comments)    Fatigue Syncope   Pacerone [Amiodarone] Other (See Comments)    Delirium Confusion Psychosis   Social History   Socioeconomic History   Marital status: Legally Separated    Spouse name: Not on file   Number of children: Not on file   Years of education: 16   Highest education level: Bachelor's degree (e.g., BA, AB, BS)  Occupational History   Occupation: 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  Tobacco Use   Smoking status: Never    Passive exposure: Past   Smokeless tobacco: Never  Vaping Use   Vaping status: Never Used  Substance and Sexual Activity   Alcohol  use: Never   Drug use: Never   Sexual activity: Not Currently  Other Topics Concern   Not on file  Social History Narrative   Right Handed    Social Drivers of Health   Tobacco Use: Low Risk (01/06/2024)   Patient History    Smoking Tobacco Use: Never    Smokeless Tobacco Use: Never    Passive Exposure: Past  Financial Resource Strain: Not on file  Food Insecurity: No Food Insecurity (12/19/2023)   Epic    Worried About Programme Researcher, Broadcasting/film/video in the Last Year: Never true    Ran Out of Food in the Last Year: Never true  Transportation Needs: No Transportation Needs (12/19/2023)    Epic    Lack of Transportation (Medical): No    Lack of Transportation (Non-Medical): No  Physical Activity: Not on file  Stress: Not on file  Social Connections: Socially Isolated (12/19/2023)   Social Connection and Isolation Panel    Frequency of Communication with Friends and Family: More than three times a week    Frequency of Social Gatherings with Friends and Family: More than three times a week    Attends  Religious Services: Never    Active Member of Clubs or Organizations: No    Attends Banker Meetings: Never    Marital Status: Separated  Intimate Partner Violence: Not At Risk (12/19/2023)   Epic    Fear of Current or Ex-Partner: No    Emotionally Abused: No    Physically Abused: No    Sexually Abused: No  Depression (PHQ2-9): Not on file  Alcohol  Screen: Not on file  Housing: Low Risk (12/19/2023)   Epic    Unable to Pay for Housing in the Last Year: No    Number of Times Moved in the Last Year: 0    Homeless in the Last Year: No  Utilities: Not At Risk (12/19/2023)   Epic    Threatened with loss of utilities: No  Health Literacy: Not on file   Family History  Problem Relation Age of Onset   Heart disease Mother    Hypertension Mother    Rheum arthritis Mother    Osteoarthritis Mother    Heart disease Father    Hypertension Father    Osteoarthritis Father    Heart disease Sister    Diabetes Brother    Cancer Brother    There were no vitals taken for this visit.  Wt Readings from Last 3 Encounters:  01/06/24 53 kg (116 lb 12.8 oz)  12/27/23 50.4 kg (111 lb 3.2 oz)  12/18/23 49.9 kg (110 lb)   PHYSICAL EXAM: General: Elderly appearing. No distress  Cardiac: JVP ~8cm cm. No murmurs  Extremities: Warm and dry.  1+ BLE edema.  Neuro: A&O x3. Affect pleasant.   MDT ICD interrogation (personally reviewed): 99.9% RV paced, OptiVol remains up, thoracic impedence trending back to threshold. No VT. Activity level 0h/day.  ASSESSMENT &  PLAN: Chronic HFpEF due to valvular heart disease - s/p bioprosthetic AVR/MV ring/bioprosthetic TVR 09/25/2010 - Echo 2/22: EF 55 to 60%, mod-sev TR, RVSP 51mm Hg, mod MR. AV not assessed. - RHC 11/22: Mild PAH. RA 11 (v waves to 14) PA 42/15 (25) PCWP 10 , CO/CI (Fick)  5.7/3.7, PVR 2.1 WU MVA (LV-wedge tracing) = 3.5 cm2 mean gradient across MV 3.4 mm HG - Echo 2/24: EF 50-55%, mod-sev prosthetic valve AS (mean 35), mod-sev TS (mean 10) with mild-mod TR, mild MR/MS - Echo 10/24: EF 60-65%, RV moderately reduced, moderate to severe TR, AoV mean gradient 32 mm Hg - Echo 12/25: EF 55%, RV dilated, mod AS (mean gradient 32), marked biatrial dilation  - NYHA IIIB chronically - Volume improving, but still up by exam and device. BMET/proBNP today. - increase lasix  to 60/40 mg + KCL 20 - start losartan  25 mg daily, repeat BMET in 10 days - stable prosthetic AS. Not candidate for intervention  2. Pulmonary HTN  - by echo - RHC 11/22 with mild PAH and normal PVR - likely due to valvular disease and probable restrictive CM - optimize volume  3. ILD - 11/20 HRCT with stable ILD compared with 2018. 2018 CT slowly progressive basal predominant, moderately severe pulmonary fibrosis. Favor NSIP - Followed by pulmonary   - No change.  4. OSA - Off CPAP with weight loss  5. Permanent Afib with SSS - has ICD in place due to h/o VT  - followed by Dr. Cindie - on Eliquis  2.5 mg bid, reduced dose, presumably for GI bleeding risk. 1 episode of epistaxis - may need to consider discontinuation of AC with frequent falls and recurrent GIB  6. H/o  VT - s/p ICD  - device interrogated today  7. GIB - Colonoscopy 2022: Left diverticulosis - Colonoscopy 3/25: no active bleeding, suspect bleeding from colonic diverticula - 2 admission in 2025 for bleeds - Followed by GI  8. SLE - Followed by Dr. Jeannetta, lupus felt to be under good control - Continued on HCQ and she is also on 5 mg prednisone  for  secondary adrenal insufficiency  9. HTN - BP remains elevated and up at home - start losartan    10. Anemia - iron deficiency d/t blood loss and iron deficiency - check CBC, anemia panel  Follow up in 1 month with APP  Huriel Matt, NP  01/12/2024

## 2024-01-12 ENCOUNTER — Telehealth (HOSPITAL_COMMUNITY): Payer: Self-pay | Admitting: Pharmacist

## 2024-01-12 ENCOUNTER — Ambulatory Visit (HOSPITAL_COMMUNITY)
Admission: RE | Admit: 2024-01-12 | Discharge: 2024-01-12 | Disposition: A | Discharge status: Home / Self Care | Source: Ambulatory Visit | Attending: Cardiology | Admitting: Cardiology

## 2024-01-12 ENCOUNTER — Encounter (HOSPITAL_COMMUNITY): Payer: Self-pay

## 2024-01-12 ENCOUNTER — Ambulatory Visit (HOSPITAL_COMMUNITY): Payer: Self-pay | Admitting: Cardiology

## 2024-01-12 VITALS — BP 162/84 | HR 73 | Wt 111.8 lb

## 2024-01-12 DIAGNOSIS — I5032 Chronic diastolic (congestive) heart failure: Secondary | ICD-10-CM | POA: Insufficient documentation

## 2024-01-12 DIAGNOSIS — Z4502 Encounter for adjustment and management of automatic implantable cardiac defibrillator: Secondary | ICD-10-CM | POA: Insufficient documentation

## 2024-01-12 DIAGNOSIS — Z7952 Long term (current) use of systemic steroids: Secondary | ICD-10-CM | POA: Insufficient documentation

## 2024-01-12 DIAGNOSIS — G4733 Obstructive sleep apnea (adult) (pediatric): Secondary | ICD-10-CM | POA: Insufficient documentation

## 2024-01-12 DIAGNOSIS — I081 Rheumatic disorders of both mitral and tricuspid valves: Secondary | ICD-10-CM | POA: Diagnosis not present

## 2024-01-12 DIAGNOSIS — E2749 Other adrenocortical insufficiency: Secondary | ICD-10-CM | POA: Insufficient documentation

## 2024-01-12 DIAGNOSIS — I110 Hypertensive heart disease with heart failure: Secondary | ICD-10-CM | POA: Insufficient documentation

## 2024-01-12 DIAGNOSIS — I495 Sick sinus syndrome: Secondary | ICD-10-CM | POA: Diagnosis not present

## 2024-01-12 DIAGNOSIS — I4821 Permanent atrial fibrillation: Secondary | ICD-10-CM | POA: Diagnosis not present

## 2024-01-12 DIAGNOSIS — Z95810 Presence of automatic (implantable) cardiac defibrillator: Secondary | ICD-10-CM | POA: Insufficient documentation

## 2024-01-12 DIAGNOSIS — I272 Pulmonary hypertension, unspecified: Secondary | ICD-10-CM | POA: Diagnosis not present

## 2024-01-12 DIAGNOSIS — Z954 Presence of other heart-valve replacement: Secondary | ICD-10-CM | POA: Diagnosis not present

## 2024-01-12 DIAGNOSIS — J8410 Pulmonary fibrosis, unspecified: Secondary | ICD-10-CM | POA: Diagnosis not present

## 2024-01-12 DIAGNOSIS — I472 Ventricular tachycardia, unspecified: Secondary | ICD-10-CM | POA: Diagnosis not present

## 2024-01-12 DIAGNOSIS — M3214 Glomerular disease in systemic lupus erythematosus: Secondary | ICD-10-CM | POA: Insufficient documentation

## 2024-01-12 DIAGNOSIS — I10 Essential (primary) hypertension: Secondary | ICD-10-CM | POA: Diagnosis not present

## 2024-01-12 DIAGNOSIS — J849 Interstitial pulmonary disease, unspecified: Secondary | ICD-10-CM | POA: Insufficient documentation

## 2024-01-12 DIAGNOSIS — Z953 Presence of xenogenic heart valve: Secondary | ICD-10-CM | POA: Insufficient documentation

## 2024-01-12 DIAGNOSIS — D500 Iron deficiency anemia secondary to blood loss (chronic): Secondary | ICD-10-CM | POA: Diagnosis not present

## 2024-01-12 DIAGNOSIS — D509 Iron deficiency anemia, unspecified: Secondary | ICD-10-CM | POA: Diagnosis not present

## 2024-01-12 DIAGNOSIS — K922 Gastrointestinal hemorrhage, unspecified: Secondary | ICD-10-CM | POA: Diagnosis not present

## 2024-01-12 DIAGNOSIS — J449 Chronic obstructive pulmonary disease, unspecified: Secondary | ICD-10-CM | POA: Diagnosis not present

## 2024-01-12 DIAGNOSIS — I4721 Torsades de pointes: Secondary | ICD-10-CM | POA: Insufficient documentation

## 2024-01-12 LAB — BASIC METABOLIC PANEL WITH GFR
Anion gap: 10 (ref 5–15)
BUN: 29 mg/dL — ABNORMAL HIGH (ref 8–23)
CO2: 31 mmol/L (ref 22–32)
Calcium: 9.8 mg/dL (ref 8.9–10.3)
Chloride: 99 mmol/L (ref 98–111)
Creatinine, Ser: 1.18 mg/dL — ABNORMAL HIGH (ref 0.44–1.00)
GFR, Estimated: 48 mL/min — ABNORMAL LOW (ref >60)
Glucose, Bld: 71 mg/dL (ref 70–99)
Potassium: 3.9 mmol/L (ref 3.5–5.1)
Sodium: 140 mmol/L (ref 135–145)

## 2024-01-12 LAB — CBC
HCT: 28.7 % — ABNORMAL LOW (ref 36.0–46.0)
Hemoglobin: 9.1 g/dL — ABNORMAL LOW (ref 12.0–15.0)
MCH: 30.1 pg (ref 26.0–34.0)
MCHC: 31.7 g/dL (ref 30.0–36.0)
MCV: 95 fL (ref 80.0–100.0)
Platelets: 215 K/uL (ref 150–400)
RBC: 3.02 MIL/uL — ABNORMAL LOW (ref 3.87–5.11)
RDW: 17 % — ABNORMAL HIGH (ref 11.5–15.5)
WBC: 7.6 K/uL (ref 4.0–10.5)
nRBC: 0 % (ref 0.0–0.2)

## 2024-01-12 LAB — FERRITIN: Ferritin: 52 ng/mL (ref 11–307)

## 2024-01-12 LAB — IRON AND TIBC
Iron: 36 ug/dL (ref 28–170)
Saturation Ratios: 9 % — ABNORMAL LOW (ref 10.4–31.8)
TIBC: 393 ug/dL (ref 250–450)
UIBC: 358 ug/dL

## 2024-01-12 LAB — PRO BRAIN NATRIURETIC PEPTIDE: Pro Brain Natriuretic Peptide: 1441 pg/mL — ABNORMAL HIGH (ref <300.0)

## 2024-01-12 MED ORDER — FUROSEMIDE 20 MG PO TABS: ORAL_TABLET | ORAL | 6 refills | Status: AC

## 2024-01-12 MED ORDER — LOSARTAN POTASSIUM 25 MG PO TABS: 25.0000 mg | ORAL_TABLET | Freq: Every day | ORAL | 3 refills | Status: AC

## 2024-01-12 NOTE — Telephone Encounter (Signed)
 Patient referred to infusion pharmacy team for ambulatory infusion of IV iron.  Insurance - Stage Manager of care - Site of care: [facility] Dx code - I50.32/D50.0 IV Iron Therapy - Venofer 300mg  x 3 Infusion appointments - Scheduling team will schedule patient as soon as possible.

## 2024-01-12 NOTE — Patient Instructions (Signed)
 CHANGE Lasix  to 60 mg in the morning and 40 mg in the evening  START Losartan  25 mg daily.  Labs done today, your results will be available in MyChart, we will contact you for abnormal readings.  REPEAT blood work in 10 days.  Your physician recommends that you schedule a follow-up appointment in: 1 month.  If you have any questions or concerns before your next appointment please send us  a message through [place] or call our office at [phone].    TO LEAVE A MESSAGE FOR THE NURSE SELECT OPTION 2, PLEASE LEAVE A MESSAGE INCLUDING: YOUR NAME DATE OF BIRTH CALL BACK NUMBER REASON FOR CALL**this is important as we prioritize the call backs  YOU WILL RECEIVE A CALL BACK THE SAME DAY AS LONG AS YOU CALL BEFORE 4:00 PM  At the [facility], you and your health needs are our priority. As part of our continuing mission to provide you with exceptional heart care, we have created designated Provider Care Teams. These Care Teams include your primary Cardiologist (physician) and Advanced Practice Providers (APPs- Physician Assistants and Nurse Practitioners) who all work together to provide you with the care you need, when you need it.   You may see any of the following providers on your designated Care Team at your next follow up: Dr Toribio Fuel Dr Ezra Shuck Dr. Morene Brownie Greig Mosses, NP Caffie Shed, [name] [facility] [place], [name] Beckey Coe, NP Jordan Lee, NP Ellouise Class, NP Tinnie Redman, PharmD Jaun Bash, PharmD   Please be sure to bring in all your medications bottles to every appointment.    Thank you for choosing [facility]

## 2024-01-13 ENCOUNTER — Telehealth (HOSPITAL_COMMUNITY): Payer: Self-pay | Admitting: Pharmacy Technician

## 2024-01-13 NOTE — Telephone Encounter (Signed)
 Auth Submission: NO AUTH NEEDED Site of care: [facility] Payer: [employer] MEDICARE Medication & CPT/J Code(s) submitted: Venofer (Iron Sucrose) J1756 Diagnosis Code: D50.0, I50.22, N18.30, Z87.19 Route of submission (phone, fax, portal):  Phone # Fax # Auth type: Buy/Bill HB Units/visits requested: 300MG  X 3 DOSES Reference number:  Approval from: 01/13/2024 to 01/25/24       Dagoberto Armour, CPhT [name] Phone: [phone] 01/13/2024

## 2024-01-14 ENCOUNTER — Other Ambulatory Visit: Payer: Self-pay | Admitting: Internal Medicine
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
## 2024-01-25 ENCOUNTER — Other Ambulatory Visit: Payer: Self-pay | Admitting: Pulmonary Disease

## 2024-01-25 DIAGNOSIS — J849 Interstitial pulmonary disease, unspecified: Secondary | ICD-10-CM

## 2024-01-25 NOTE — Telephone Encounter (Signed)
 Refill sent for OFEV  to [facility] (pulmonary fibrosis team): [phone]  Dose: 150mg  tablet by mouth BID  Last OV: 09/30/23 Provider: Dr. Annella Pertinent labs: LFTs 12/18/23  Next OV: due March 2026  Lindsey Huber, PharmD, BCPS Clinical Pharmacist  [facility]

## 2024-01-25 NOTE — Telephone Encounter (Signed)
 Pt requesting refill of specialty medication - routing to Rx team to advise.

## 2024-01-25 NOTE — Telephone Encounter (Signed)
 Copied from CRM [id number]. Topic: Clinical - Medication Refill >> Jan 25, 2024  8:50 AM Corean SAUNDERS wrote: Medication: Nintedanib  (OFEV ) 150 MG CAPS   Has the patient contacted their pharmacy? Yes pharmacy contacted E2C2 (Agent: If no, request that the patient contact the pharmacy for the refill. If patient does not wish to contact the pharmacy document the reason why and proceed with request.) (Agent: If yes, when and what did the pharmacy advise?)  This is the patient's preferred pharmacy:   [facility] - [name], [place] - [address] [name] [address] Phone: [phone] Fax: [phone]  Is this the correct pharmacy for this prescription? Yes If no, delete pharmacy and type the correct one.   Has the prescription been filled recently? Yes  Is the patient out of the medication? No  Has the patient been seen for an appointment in the last year OR does the patient have an upcoming appointment? Yes  Can we respond through MyChart? No  Agent: Please be advised that Rx refills may take up to 3 business days. We ask that you follow-up with your pharmacy.

## 2024-01-27 ENCOUNTER — Encounter (HOSPITAL_COMMUNITY)
Admission: RE | Admit: 2024-01-27 | Discharge: 2024-01-27 | Disposition: A | Discharge status: Home / Self Care | Source: Ambulatory Visit | Attending: Cardiology | Admitting: Cardiology

## 2024-01-27 ENCOUNTER — Ambulatory Visit (HOSPITAL_BASED_OUTPATIENT_CLINIC_OR_DEPARTMENT_OTHER)
Admission: RE | Admit: 2024-01-27 | Discharge: 2024-01-27 | Disposition: A | Discharge status: Home / Self Care | Source: Ambulatory Visit | Attending: Cardiology | Admitting: Cardiology

## 2024-01-27 ENCOUNTER — Encounter: Payer: Self-pay | Admitting: Internal Medicine

## 2024-01-27 VITALS — BP 134/73 | HR 70 | Temp 98.0°F | Resp 15

## 2024-01-27 DIAGNOSIS — M1710 Unilateral primary osteoarthritis, unspecified knee: Secondary | ICD-10-CM | POA: Insufficient documentation

## 2024-01-27 DIAGNOSIS — I7300 Raynaud's syndrome without gangrene: Secondary | ICD-10-CM | POA: Insufficient documentation

## 2024-01-27 DIAGNOSIS — N183 Chronic kidney disease, stage 3 unspecified: Secondary | ICD-10-CM

## 2024-01-27 DIAGNOSIS — Z8673 Personal history of transient ischemic attack (TIA), and cerebral infarction without residual deficits: Secondary | ICD-10-CM | POA: Insufficient documentation

## 2024-01-27 DIAGNOSIS — E2740 Unspecified adrenocortical insufficiency: Secondary | ICD-10-CM | POA: Diagnosis not present

## 2024-01-27 DIAGNOSIS — I130 Hypertensive heart and chronic kidney disease with heart failure and stage 1 through stage 4 chronic kidney disease, or unspecified chronic kidney disease: Secondary | ICD-10-CM | POA: Diagnosis not present

## 2024-01-27 DIAGNOSIS — Z953 Presence of xenogenic heart valve: Secondary | ICD-10-CM | POA: Insufficient documentation

## 2024-01-27 DIAGNOSIS — Z7952 Long term (current) use of systemic steroids: Secondary | ICD-10-CM | POA: Diagnosis not present

## 2024-01-27 DIAGNOSIS — Z79899 Other long term (current) drug therapy: Secondary | ICD-10-CM | POA: Insufficient documentation

## 2024-01-27 DIAGNOSIS — Z7901 Long term (current) use of anticoagulants: Secondary | ICD-10-CM | POA: Diagnosis not present

## 2024-01-27 DIAGNOSIS — Z95810 Presence of automatic (implantable) cardiac defibrillator: Secondary | ICD-10-CM | POA: Diagnosis not present

## 2024-01-27 DIAGNOSIS — R569 Unspecified convulsions: Secondary | ICD-10-CM | POA: Diagnosis not present

## 2024-01-27 DIAGNOSIS — M10071 Idiopathic gout, right ankle and foot: Secondary | ICD-10-CM | POA: Insufficient documentation

## 2024-01-27 DIAGNOSIS — I5032 Chronic diastolic (congestive) heart failure: Secondary | ICD-10-CM | POA: Insufficient documentation

## 2024-01-27 DIAGNOSIS — M10072 Idiopathic gout, left ankle and foot: Secondary | ICD-10-CM | POA: Insufficient documentation

## 2024-01-27 DIAGNOSIS — M3214 Glomerular disease in systemic lupus erythematosus: Secondary | ICD-10-CM | POA: Diagnosis present

## 2024-01-27 DIAGNOSIS — D6869 Other thrombophilia: Secondary | ICD-10-CM | POA: Insufficient documentation

## 2024-01-27 DIAGNOSIS — R682 Dry mouth, unspecified: Secondary | ICD-10-CM | POA: Diagnosis not present

## 2024-01-27 DIAGNOSIS — D500 Iron deficiency anemia secondary to blood loss (chronic): Secondary | ICD-10-CM

## 2024-01-27 DIAGNOSIS — Z8719 Personal history of other diseases of the digestive system: Secondary | ICD-10-CM

## 2024-01-27 DIAGNOSIS — D509 Iron deficiency anemia, unspecified: Secondary | ICD-10-CM | POA: Insufficient documentation

## 2024-01-27 DIAGNOSIS — J84112 Idiopathic pulmonary fibrosis: Secondary | ICD-10-CM | POA: Insufficient documentation

## 2024-01-27 LAB — BASIC METABOLIC PANEL WITH GFR
Anion gap: 10 (ref 5–15)
BUN: 36 mg/dL — ABNORMAL HIGH (ref 8–23)
CO2: 24 mmol/L (ref 22–32)
Calcium: 9.5 mg/dL (ref 8.9–10.3)
Chloride: 100 mmol/L (ref 98–111)
Creatinine, Ser: 1.42 mg/dL — ABNORMAL HIGH (ref 0.44–1.00)
GFR, Estimated: 38 mL/min — ABNORMAL LOW
Glucose, Bld: 69 mg/dL — ABNORMAL LOW (ref 70–99)
Potassium: 5.1 mmol/L (ref 3.5–5.1)
Sodium: 134 mmol/L — ABNORMAL LOW (ref 135–145)

## 2024-01-27 MED ORDER — IRON SUCROSE 300 MG IVPB - SIMPLE MED
300.0000 mg | Freq: Once | Status: AC
  Administered 2024-01-27: 300 mg via INTRAVENOUS

## 2024-01-27 MED ORDER — IRON SUCROSE 300 MG IVPB - SIMPLE MED
Status: AC
  Filled 2024-01-27: qty 265

## 2024-01-30 ENCOUNTER — Encounter: Payer: Self-pay | Admitting: Internal Medicine

## 2024-01-30 ENCOUNTER — Encounter (INDEPENDENT_AMBULATORY_CARE_PROVIDER_SITE_OTHER): Admitting: Internal Medicine

## 2024-01-30 VITALS — BP 124/70 | HR 71 | Temp 99.0°F | Resp 17 | Ht 62.0 in | Wt 117.0 lb

## 2024-01-30 DIAGNOSIS — M1A9XX0 Chronic gout, unspecified, without tophus (tophi): Secondary | ICD-10-CM

## 2024-01-30 DIAGNOSIS — J84112 Idiopathic pulmonary fibrosis: Secondary | ICD-10-CM

## 2024-01-30 DIAGNOSIS — M329 Systemic lupus erythematosus, unspecified: Secondary | ICD-10-CM | POA: Diagnosis not present

## 2024-01-30 DIAGNOSIS — Z79899 Other long term (current) drug therapy: Secondary | ICD-10-CM

## 2024-01-30 NOTE — Telephone Encounter (Addendum)
 Pt aware, agreeable, and verbalized understanding  Labs ordered and scheduled, med list updated   ----- Message from Jordan Lee, NP sent at 01/30/2024  7:26 AM EST ----- Potassium slightly up after starting losartan . Please instruct her to stop her 20 mEQ KCL and repeat labs in a week. Repeat proBNP and BMET.

## 2024-01-30 NOTE — Patient Instructions (Signed)
 I recommend symptom treatments for eye dryness including lubricating eye drops and can use gel or ointment based products for overnight.  Also consider use of humidifier at night during dry weather.  Use follow-up regularly with your eye doctor.  If symptoms worsen there are several types of medicated eyedrop that can help with the dryness or inflammation.  For chronic dry mouth is important to stay well-hydrated.  You can also use sugar-free gum or lozenges to stimulate the saliva production.  Biotene mouthwash or lozenges may also be helpful.  Products into [employer] also work to stimulate saliva production.  Continue following up with your dentist regularly because dryness problems can increase the risk of tooth and gum decay.  If symptoms get worse there are medications for stimulating tear and saliva production but will try all the other options first.

## 2024-01-30 NOTE — Addendum Note (Signed)
 Addended by: Ezell Poke M on: 01/30/2024 12:32 PM   Modules accepted: Orders

## 2024-01-31 LAB — CBC WITH DIFFERENTIAL/PLATELET
Absolute Lymphocytes: 1396 {cells}/uL (ref 850–3900)
Absolute Monocytes: 577 {cells}/uL (ref 200–950)
Basophils Absolute: 8 {cells}/uL (ref 0–200)
Basophils Relative: 0.1 %
Eosinophils Absolute: 62 {cells}/uL (ref 15–500)
Eosinophils Relative: 0.8 %
HCT: 24.4 % — ABNORMAL LOW (ref 35.9–46.0)
Hemoglobin: 7.6 g/dL — ABNORMAL LOW (ref 11.7–15.5)
MCH: 29.3 pg (ref 27.0–33.0)
MCHC: 31.1 g/dL — ABNORMAL LOW (ref 31.6–35.4)
MCV: 94.2 fL (ref 81.4–101.7)
MPV: 10 fL (ref 7.5–12.5)
Monocytes Relative: 7.4 %
Neutro Abs: 5756 {cells}/uL (ref 1500–7800)
Neutrophils Relative %: 73.8 %
Platelets: 270 Thousand/uL (ref 140–400)
RBC: 2.59 Million/uL — ABNORMAL LOW (ref 3.80–5.10)
RDW: 15.8 % — ABNORMAL HIGH (ref 11.0–15.0)
Total Lymphocyte: 17.9 %
WBC: 7.8 Thousand/uL (ref 3.8–10.8)

## 2024-01-31 LAB — URIC ACID: Uric Acid, Serum: 5.6 mg/dL (ref 2.5–7.0)

## 2024-01-31 LAB — SEDIMENTATION RATE: Sed Rate: 68 mm/h — ABNORMAL HIGH (ref 0–30)

## 2024-01-31 LAB — C-REACTIVE PROTEIN: CRP: 7.2 mg/L

## 2024-02-03 ENCOUNTER — Encounter (HOSPITAL_COMMUNITY)
Admission: RE | Admit: 2024-02-03 | Discharge: 2024-02-03 | Disposition: A | Source: Ambulatory Visit | Attending: Cardiology

## 2024-02-03 ENCOUNTER — Encounter: Payer: Self-pay | Admitting: Internal Medicine

## 2024-02-03 VITALS — BP 133/67 | HR 68 | Temp 98.4°F | Resp 15

## 2024-02-03 DIAGNOSIS — I5032 Chronic diastolic (congestive) heart failure: Secondary | ICD-10-CM

## 2024-02-03 DIAGNOSIS — Z8719 Personal history of other diseases of the digestive system: Secondary | ICD-10-CM

## 2024-02-03 DIAGNOSIS — N183 Chronic kidney disease, stage 3 unspecified: Secondary | ICD-10-CM

## 2024-02-03 DIAGNOSIS — D6869 Other thrombophilia: Secondary | ICD-10-CM | POA: Diagnosis not present

## 2024-02-03 DIAGNOSIS — D500 Iron deficiency anemia secondary to blood loss (chronic): Secondary | ICD-10-CM

## 2024-02-03 MED ORDER — IRON SUCROSE 300 MG IVPB - SIMPLE MED
300.0000 mg | Freq: Once | Status: AC
  Administered 2024-02-03: 300 mg via INTRAVENOUS
  Filled 2024-02-03: qty 300

## 2024-02-09 ENCOUNTER — Encounter: Payer: Self-pay | Admitting: Internal Medicine

## 2024-02-10 ENCOUNTER — Ambulatory Visit (HOSPITAL_COMMUNITY)
Admission: RE | Admit: 2024-02-10 | Discharge: 2024-02-10 | Disposition: A | Discharge status: Home / Self Care | Source: Ambulatory Visit | Attending: Cardiology | Admitting: Cardiology

## 2024-02-10 ENCOUNTER — Ambulatory Visit (HOSPITAL_COMMUNITY)

## 2024-02-10 VITALS — BP 129/65 | HR 71 | Temp 98.4°F | Resp 16

## 2024-02-10 DIAGNOSIS — I5032 Chronic diastolic (congestive) heart failure: Secondary | ICD-10-CM | POA: Insufficient documentation

## 2024-02-10 DIAGNOSIS — D500 Iron deficiency anemia secondary to blood loss (chronic): Secondary | ICD-10-CM | POA: Insufficient documentation

## 2024-02-10 DIAGNOSIS — N183 Chronic kidney disease, stage 3 unspecified: Secondary | ICD-10-CM | POA: Diagnosis not present

## 2024-02-10 DIAGNOSIS — Z8719 Personal history of other diseases of the digestive system: Secondary | ICD-10-CM | POA: Diagnosis not present

## 2024-02-10 LAB — BASIC METABOLIC PANEL WITH GFR
Anion gap: 11 (ref 5–15)
BUN: 28 mg/dL — ABNORMAL HIGH (ref 8–23)
CO2: 28 mmol/L (ref 22–32)
Calcium: 9.2 mg/dL (ref 8.9–10.3)
Chloride: 102 mmol/L (ref 98–111)
Creatinine, Ser: 1.13 mg/dL — ABNORMAL HIGH (ref 0.44–1.00)
GFR, Estimated: 50 mL/min — ABNORMAL LOW
Glucose, Bld: 88 mg/dL (ref 70–99)
Potassium: 3.2 mmol/L — ABNORMAL LOW (ref 3.5–5.1)
Sodium: 140 mmol/L (ref 135–145)

## 2024-02-10 LAB — PRO BRAIN NATRIURETIC PEPTIDE: Pro Brain Natriuretic Peptide: 2758 pg/mL — ABNORMAL HIGH

## 2024-02-10 MED ORDER — IRON SUCROSE 300 MG IVPB - SIMPLE MED
300.0000 mg | Freq: Once | Status: AC
  Administered 2024-02-10: 300 mg via INTRAVENOUS

## 2024-02-10 MED ORDER — IRON SUCROSE 300 MG IVPB - SIMPLE MED
Status: AC
  Filled 2024-02-10: qty 265

## 2024-02-14 ENCOUNTER — Ambulatory Visit: Payer: Medicare HMO

## 2024-02-14 ENCOUNTER — Ambulatory Visit (HOSPITAL_COMMUNITY): Payer: Self-pay | Admitting: Cardiology

## 2024-02-14 DIAGNOSIS — I429 Cardiomyopathy, unspecified: Secondary | ICD-10-CM

## 2024-02-15 LAB — CUP PACEART REMOTE DEVICE CHECK
Battery Remaining Longevity: 34 mo
Battery Voltage: 2.95 V
Brady Statistic RV Percent Paced: 99.3 %
Date Time Interrogation Session: 20260120043823
HighPow Impedance: 43 Ohm
Implantable Lead Connection Status: 753985
Implantable Lead Implant Date: 20120920
Implantable Lead Location: 753862
Implantable Pulse Generator Implant Date: 20190809
Lead Channel Impedance Value: 247 Ohm
Lead Channel Impedance Value: 285 Ohm
Lead Channel Pacing Threshold Amplitude: 0.625 V
Lead Channel Pacing Threshold Pulse Width: 0.4 ms
Lead Channel Sensing Intrinsic Amplitude: 6.5 mV
Lead Channel Sensing Intrinsic Amplitude: 6.5 mV
Lead Channel Setting Pacing Amplitude: 1.25 V
Lead Channel Setting Pacing Pulse Width: 0.4 ms
Lead Channel Setting Sensing Sensitivity: 0.3 mV
Zone Setting Status: 755011
Zone Setting Status: 755011

## 2024-02-15 NOTE — Progress Notes (Incomplete)
 "   ADVANCED HF CLINIC NOTE Primary Care: [name], [name], PA EP Cardiologist: Dr. Cindie HF Cardiologist: Dr. Vanda  HPI: Lindsey Huber is a 79 y.o. female with chronic diastolic CHF, permanent AF, AFL s/p ablation, SLE, pulmonary fibrosis, pulmonary hypertension. paroxysmal VT, OSA not on cpap and valvular disease s/p AVR/MV ring/TVR. Referred by Dr. Cindie in 10/22 for further evaluation of PAH and valvular heart disease.  Underwent  AVR/MV ring/TVR 09/25/2010.  Paroxysmal VT, ICD placed due to torsades on Flecainide with SSS requiring PPM which predated valve repar/replacement surgery 09/25/2010 and in fact ICD lead was damaged during valve replacement surgery.       History of chronic COPD and pulmonary fibrosis followed by pulmonology.  On triple therapy for COPD vs asthma, nintedanib  for IPF. Followed by Dr Annella, Did not smoke but inhaled second hand smoke from husband and mother pretty much her whole life until 01/2020.   Echo 11/20: EF 63%,normal bioprosthetic tricuspid and aortic valve with mild tricuspid regurgitation, mitral valve repair with mild mitral regurgitation, severe biatrial enlargement.   Echo 03/11/2020  LVEF 55 to 60%, moderate to severe tricuspid regurgitation, moderately elevated right ventricular systolic pressure estimate 51mm Hg, moderate mitral regurgitation.  RV not well visualized.    Seen in HF Clinic for first visit 10/27/20. Had NYHA IIIB symptoms. Referred for R/L cath--> normal coronaries, EF 60-65%, and normal left sided filling pressures.   R/LHC 11/27/20: Ao =129/63 (89) LV = 141/8 RA = 11 (v waves to 14) RV = 43/4 PA = 42/15 (25) PCW = 10 Fick cardiac output/index = 5.7/3.7 PVR = 2.1 WU SVR = 1101 PAPi = 2.45 WU. Normal coronary arteries, EF 60-65%, very mild PAH. Normal left-sided filling pressures with no significant v-waves in PCWP tracing to suggest hemodynamically significant MR. No significant stenosis across AoV, MV or TV  Admitted 11/22  with acute LGIB. CT scan showed acute GI hemorrhage involving mid descending colon, likely acute diverticular bleed. Transfused 3u RBCs. Colonoscopy 11/26 which noted hemorrhoids and diverticulosis, no active bleeding, recommended to restart Xarelto  in 3 days if stable.   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  Admitted 3/25 with GIB 2/2 diverticular bleeding. AC held then restarted. Re-admitted later in the month with GI bleeding, AC held agin. Underwent colonoscopy showing hemorrhoids/diverticulosis-no active bleeding.  AC restarted.  Admitted 11/25 with recurrent GIB. EGD showed medium size hiatal hernia, acute gastritis that was biopsied. Hgb ok   Echo 12/25 showed EF 55% RV dilated Mod AS (mean gradient 32) Marked biatrial dilation   Today she returns for HF follow up with her daughter. Overall feeling fine. Breathing has been better, she is less SOB walking around her home. She has LE swelling and weight is up to 120 lbs at home. She feels pressure at ICD site and palpitations for past week. She has dizziness when she bends over. Denies  abnormal bleeding, CP, or PND/Orthopnea. Appetite ok. Taking all medications.    Past Medical History:  Diagnosis Date   ABLA (acute blood loss anemia) 07/04/2022   Achalasia    Acquired hypothyroidism 08/20/2020   Acute lower GI bleeding 11/02/2022   Acute metabolic encephalopathy 07/25/2021   AICD (automatic cardioverter/defibrillator) present 11/14/2019   Allergic rhinitis    Ankle fracture 03/25/2022   Aortic valve disorder 03/26/2002   Atherosclerosis of abdominal aorta 05/01/2020   Cardiomyopathy (HCC)  CHB (complete heart block) (HCC) 08/18/2017   CHF (congestive heart failure) (HCC)    Cholelithiasis without obstruction 05/01/2020   Chronic gouty arthritis    Chronic heart failure with preserved ejection fraction (HCC) 05/01/2020    Chronic kidney disease (CKD), active medical management without dialysis, stage 3 (moderate) (HCC) 05/01/2020   Closed torus fracture of distal end of right radius with delayed healing 08/12/2021   Delusional thoughts (HCC)    Diverticulosis of colon 05/01/2020   Epistaxis    Essential (primary) hypertension 08/18/2017   Gastroesophageal reflux disease 05/01/2020   Gastrointestinal hemorrhage    H/O transfusion of packed red blood cells    Hemorrhagic shock (HCC) 07/02/2022   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 11/14/2019   Hypercoagulability due to atrial fibrillation (HCC) 05/01/2020   Hyperlipidemia 11/14/2019   Hypocalcemia 12/14/2020   Hypoglycemia    Hypokalemia 12/14/2020   Hypomagnesemia 12/14/2020   Hypotension 12/14/2020   Idiopathic pulmonary fibrosis (HCC) 05/01/2020   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   Iron  deficiency anemia    Long term (current) use of anticoagulants    Malnutrition of mild degree [name]: 75% to less than 90% of standard weight)    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/12/2021   Mitral and aortic incompetence 11/14/2019   Non-rheumatic atrial fibrillation (HCC) 05/01/2020   Non-toxic multinodular goiter 08/20/2020   NSVT (nonsustained ventricular tachycardia) (HCC) 08/18/2017   Oropharyngeal dysphagia    Osteoarthritis of hip 05/01/2020   Osteoarthritis of knee 05/01/2020   Osteopenia of neck of left femur    Pain of left hip joint 12/12/2017   Presence of permanent cardiac pacemaker    [employer]   Proteinuria 05/01/2020   Raynaud's disease    Recurrent falls 04/09/2021   Rheumatoid arthritis (HCC)    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     albuterol  (VENTOLIN  HFA) 108 (90 Base) MCG/ACT inhaler Inhale 2 puffs into the lungs every 6 (six) hours as needed for wheezing or shortness of breath. 2 each 6   allopurinol  (ZYLOPRIM ) 100 MG tablet TAKE 1 TABLET BY MOUTH EVERY DAY 90 tablet 3   apixaban  (ELIQUIS ) 2.5 MG TABS tablet Take 1 tablet (2.5 mg total) by mouth 2 (two) times daily. 180 tablet 3   Budeson-Glycopyrrol-Formoterol  (BREZTRI  AEROSPHERE) 160-9-4.8 MCG/ACT AERO Inhale 2 puffs into the lungs 2 (two) times daily as needed (for flares). 1 each 11   calcium -vitamin D  (OSCAL WITH D) 500-5 MG-MCG tablet Take 2 tablets by mouth 2 (two) times daily. 120 tablet 0   cetirizine  HCl (ZYRTEC ) 5 MG/5ML SOLN Take 1 mL (1 mg total) by mouth 2 (two) times daily as needed for rhinitis or allergies.     Cholecalciferol  (VITAMIN D3) 25 MCG (1000 UT) CAPS Take 1,000 Units by mouth daily with lunch.     fluticasone  (FLONASE ) 50 MCG/ACT nasal spray Place 1 spray into both nostrils as needed for allergies or rhinitis.     furosemide  (  LASIX ) 20 MG tablet Take 3 tablets (60 mg total) by mouth every morning AND 2 tablets (40 mg total) every evening. 200 tablet 6   levETIRAcetam  (KEPPRA ) 500 MG tablet Take 1 tablet (500 mg total) by mouth 2 (two) times daily. 180 tablet 1   losartan  (COZAAR ) 25 MG tablet Take 1 tablet (25 mg total) by mouth daily. 90 tablet 3   magnesium  oxide (MAG-OX) 400 (240 Mg) MG tablet TAKE 1 TABLET BY MOUTH EVERY DAY 90 tablet 3   OFEV  150 MG CAPS TAKE 1 CAPSULE BY MOUTH AT BEDTIME. 30 capsule 3   OLANZapine  (ZYPREXA ) 2.5 MG tablet TAKE 1 TABLET BY MOUTH EVERYDAY AT BEDTIME 90 tablet 1   pantoprazole  (PROTONIX ) 40 MG tablet Take 1 tablet (40 mg total) by mouth 2 (two) times daily. 60 tablet 1   polyethylene  glycol powder (MIRALAX ) 17 GM/SCOOP powder Take 17 g by mouth daily as needed for mild constipation.     predniSONE  (DELTASONE ) 5 MG tablet TAKE 1 TABLET BY MOUTH DAILY WITH BREAKFAST. PATIENT TO DOUBLE UP DOSE DURING SICK DAY RULE 90 tablet 4   No current facility-administered medications for this encounter.   Allergies  Allergen Reactions   Diprivan  [Propofol ] Shortness Of Breath and Other (See Comments)    Caused asthma   Other Other (See Comments)    Patient is to NOT EAT any foods with husks or tree nuts, popcorn   Tambocor [Flecainide] Other (See Comments)    Unknown reaction   Norvasc [Amlodipine] Other (See Comments)    Fatigue Syncope   Pacerone [Amiodarone] Other (See Comments)    Delirium Confusion Psychosis   Social History   Socioeconomic History   Marital status: Legally Separated    Spouse name: Not on file   Number of children: Not on file   Years of education: 16   Highest education level: Bachelor's degree (e.g., BA, AB, BS)  Occupational History   Occupation: 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  Tobacco Use   Smoking status: Never    Passive exposure: Past   Smokeless tobacco: Never  Vaping Use   Vaping status: Never Used  Substance and Sexual Activity   Alcohol  use: Never   Drug use: Never   Sexual activity: Not Currently  Other Topics Concern   Not on file  Social History Narrative   Right Handed    Social Drivers of Health   Tobacco Use: Low Risk (02/17/2024)   Patient History    Smoking Tobacco Use: Never    Smokeless Tobacco Use: Never    Passive Exposure: Past  Financial Resource Strain: Not on file  Food Insecurity: No Food Insecurity (12/19/2023)   Epic    Worried About Programme Researcher, Broadcasting/film/video in the Last Year: Never true    Ran Out of Food in the Last Year: Never true  Transportation Needs: No Transportation Needs (12/19/2023)   Epic    Lack of Transportation (Medical): No    Lack of Transportation (Non-Medical): No  Physical  Activity: Not on file  Stress: Not on file  Social Connections: Socially Isolated (12/19/2023)   Social Connection and Isolation Panel    Frequency of Communication with Friends and Family: More than three times a week    Frequency of Social Gatherings with Friends and Family: More than three times a week    Attends Religious Services: Never    Database Administrator or Organizations: No    Attends Banker Meetings:  Never    Marital Status: Separated  Intimate Partner Violence: Not At Risk (12/19/2023)   Epic    Fear of Current or Ex-Partner: No    Emotionally Abused: No    Physically Abused: No    Sexually Abused: No  Depression (PHQ2-9): Not on file  Alcohol  Screen: Not on file  Housing: Low Risk (12/19/2023)   Epic    Unable to Pay for Housing in the Last Year: No    Number of Times Moved in the Last Year: 0    Homeless in the Last Year: No  Utilities: Not At Risk (12/19/2023)   Epic    Threatened with loss of utilities: No  Health Literacy: Not on file   Family History  Problem Relation Age of Onset   Heart disease Mother    Hypertension Mother    Rheum arthritis Mother    Osteoarthritis Mother    Heart disease Father    Hypertension Father    Osteoarthritis Father    Heart disease Sister    Diabetes Brother    Cancer Brother    Wt Readings from Last 3 Encounters:  02/17/24 57.2 kg (126 lb)  01/30/24 53.1 kg (117 lb)  01/12/24 50.7 kg (111 lb 12.8 oz)   BP 138/76   Pulse 71   Ht 5' 3 (1.6 m)   Wt 57.2 kg (126 lb)   SpO2 98%   BMI 22.32 kg/m   PHYSICAL EXAM: General:  NAD. No resp difficulty, arrived in [facility], frail, chronically-ill appearing, pale HEENT: Normal Neck: Supple. JVP to ear. Cor: Regular rate & rhythm. No rubs, gallops, 3/6 RUSB and 3/6 LUSB Lungs: Clear Abdomen: Soft, nontender, nondistended.  Extremities: No cyanosis, clubbing, rash, 2+ BLE edema Neuro: Alert & oriented x 3, moves all 4 extremities w/o difficulty. Affect  pleasant.  ICD interrogation (personally reviewed): OptiVol up and thoracic impedence down, no AF or VT, 0.1 hr/day activity, 99.3% VP.  ASSESSMENT & PLAN: Acute on Chronic HFpEF due to valvular heart disease - s/p bioprosthetic AVR/MV ring/bioprosthetic TVR 09/25/2010 - Echo 2/22: EF 55 to 60%, mod-sev TR, RVSP 51mm Hg, mod MR. AV not assessed. - RHC 11/22: Mild PAH. RA 11 (v waves to 14) PA 42/15 (25) PCWP 10 , CO/CI (Fick)  5.7/3.7, PVR 2.1 WU MVA (LV-wedge tracing) = 3.5 cm2 mean gradient across MV 3.4 mm HG - Echo 2/24: EF 50-55%, mod-sev prosthetic valve AS (mean 35), mod-sev TS (mean 10) with mild-mod TR, mild MR/MS - Echo 10/24: EF 60-65%, RV moderately reduced, moderate to severe TR, AoV mean gradient 32 mm Hg - Echo 12/25: EF 55%, RV dilated, mod AS (mean gradient 32), marked biatrial dilation  - NYHA IIIb, confounded by deconditioning. She is significantly volume overloaded today on exam and OptiVol. Weight up another 10 lbs - Use Furoscix  daily + 40 KCL daily x 3 days (hold Lasix  while using Furoscix ) - After 3 days, resume Lasix  60/40 + 10 KCL daily. - Compression hose - Continue losartan  25 mg daily - Consider spiro next visit. - Labs today. Needs BMET at follow up next week  2. 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 ILD - 11/20 HRCT with stable ILD compared with 2018. 2018 CT slowly progressive basal predominant, moderately severe pulmonary fibrosis. Favor NSIP - Followed by pulmonary   - No change.  4. OSA -  Off CPAP with weight loss  5. Permanent Afib with SSS - has ICD in place due to h/o VT  - followed by Dr. Cindie - on Eliquis  2.5 mg bid, reduced dose, presumably for GI bleeding risk.   - may need to consider discontinuation of AC with frequent falls and recurrent GIB  6. H/o VT - s/p ICD  - device interrogated today  7. GIB - Colonoscopy  2022: Left diverticulosis - Colonoscopy 3/25: no active bleeding, suspect bleeding from colonic diverticula - 2 admission in 2025 for bleeds. - Followed by GI - CBC on 02/10/24 showed hgb 7.6. Repeat CBC today   8. SLE - Followed by Dr. Jeannetta, lupus felt to be under good control - Continued on HCQ and she is also on 5 mg prednisone  for secondary adrenal insufficiency  9. HTN - BP mildly elevated today - Diurese as above - Consider addition of spiro next visit  10. Anemia - iron  deficiency d/t blood loss and iron  deficiency - received recent iron  infusion - Recheck CBC   Follow up in 1 week with APP. She is high risk for re-admission.  Lindsey Huber [place], FNP  02/17/24  "

## 2024-02-16 ENCOUNTER — Telehealth (HOSPITAL_COMMUNITY): Payer: Self-pay

## 2024-02-16 NOTE — Telephone Encounter (Signed)
 Called and spoke to pt's daughter [name] to confirm/remind patient of their appointment at the [facility] on 02/17/24.   Appointment:   [x] Confirmed  [] Left mess   [] No answer/No voice mail  [] VM Full/unable to leave message  [] Phone not in service  Patient reminded to bring all medications and/or complete list.  Confirmed patient has transportation. Gave directions, instructed to utilize valet parking.

## 2024-02-17 ENCOUNTER — Encounter (HOSPITAL_COMMUNITY): Payer: Self-pay

## 2024-02-17 ENCOUNTER — Ambulatory Visit (HOSPITAL_COMMUNITY)
Admission: RE | Admit: 2024-02-17 | Discharge: 2024-02-17 | Disposition: A | Source: Ambulatory Visit | Attending: Family Medicine

## 2024-02-17 ENCOUNTER — Other Ambulatory Visit (HOSPITAL_COMMUNITY): Payer: Self-pay

## 2024-02-17 ENCOUNTER — Ambulatory Visit (HOSPITAL_COMMUNITY): Payer: Self-pay | Admitting: Family Medicine

## 2024-02-17 ENCOUNTER — Other Ambulatory Visit: Payer: Self-pay

## 2024-02-17 VITALS — BP 138/76 | HR 71 | Ht 63.0 in | Wt 126.0 lb

## 2024-02-17 DIAGNOSIS — E2749 Other adrenocortical insufficiency: Secondary | ICD-10-CM | POA: Diagnosis not present

## 2024-02-17 DIAGNOSIS — J449 Chronic obstructive pulmonary disease, unspecified: Secondary | ICD-10-CM | POA: Diagnosis not present

## 2024-02-17 DIAGNOSIS — I495 Sick sinus syndrome: Secondary | ICD-10-CM | POA: Diagnosis not present

## 2024-02-17 DIAGNOSIS — J8410 Pulmonary fibrosis, unspecified: Secondary | ICD-10-CM | POA: Diagnosis not present

## 2024-02-17 DIAGNOSIS — Z79899 Other long term (current) drug therapy: Secondary | ICD-10-CM | POA: Diagnosis not present

## 2024-02-17 DIAGNOSIS — E8770 Fluid overload, unspecified: Secondary | ICD-10-CM | POA: Insufficient documentation

## 2024-02-17 DIAGNOSIS — Z95810 Presence of automatic (implantable) cardiac defibrillator: Secondary | ICD-10-CM | POA: Insufficient documentation

## 2024-02-17 DIAGNOSIS — D631 Anemia in chronic kidney disease: Secondary | ICD-10-CM | POA: Insufficient documentation

## 2024-02-17 DIAGNOSIS — D500 Iron deficiency anemia secondary to blood loss (chronic): Secondary | ICD-10-CM | POA: Diagnosis not present

## 2024-02-17 DIAGNOSIS — M3214 Glomerular disease in systemic lupus erythematosus: Secondary | ICD-10-CM | POA: Insufficient documentation

## 2024-02-17 DIAGNOSIS — I272 Pulmonary hypertension, unspecified: Secondary | ICD-10-CM | POA: Insufficient documentation

## 2024-02-17 DIAGNOSIS — G4733 Obstructive sleep apnea (adult) (pediatric): Secondary | ICD-10-CM | POA: Insufficient documentation

## 2024-02-17 DIAGNOSIS — I10 Essential (primary) hypertension: Secondary | ICD-10-CM

## 2024-02-17 DIAGNOSIS — N189 Chronic kidney disease, unspecified: Secondary | ICD-10-CM | POA: Insufficient documentation

## 2024-02-17 DIAGNOSIS — K922 Gastrointestinal hemorrhage, unspecified: Secondary | ICD-10-CM

## 2024-02-17 DIAGNOSIS — Z7952 Long term (current) use of systemic steroids: Secondary | ICD-10-CM | POA: Diagnosis not present

## 2024-02-17 DIAGNOSIS — M329 Systemic lupus erythematosus, unspecified: Secondary | ICD-10-CM | POA: Diagnosis not present

## 2024-02-17 DIAGNOSIS — I5033 Acute on chronic diastolic (congestive) heart failure: Secondary | ICD-10-CM | POA: Diagnosis present

## 2024-02-17 DIAGNOSIS — I4821 Permanent atrial fibrillation: Secondary | ICD-10-CM | POA: Insufficient documentation

## 2024-02-17 DIAGNOSIS — Z953 Presence of xenogenic heart valve: Secondary | ICD-10-CM | POA: Diagnosis not present

## 2024-02-17 DIAGNOSIS — K5730 Diverticulosis of large intestine without perforation or abscess without bleeding: Secondary | ICD-10-CM | POA: Insufficient documentation

## 2024-02-17 DIAGNOSIS — I083 Combined rheumatic disorders of mitral, aortic and tricuspid valves: Secondary | ICD-10-CM | POA: Diagnosis not present

## 2024-02-17 DIAGNOSIS — I130 Hypertensive heart and chronic kidney disease with heart failure and stage 1 through stage 4 chronic kidney disease, or unspecified chronic kidney disease: Secondary | ICD-10-CM | POA: Insufficient documentation

## 2024-02-17 DIAGNOSIS — K449 Diaphragmatic hernia without obstruction or gangrene: Secondary | ICD-10-CM | POA: Diagnosis not present

## 2024-02-17 DIAGNOSIS — J849 Interstitial pulmonary disease, unspecified: Secondary | ICD-10-CM

## 2024-02-17 DIAGNOSIS — I472 Ventricular tachycardia, unspecified: Secondary | ICD-10-CM | POA: Diagnosis not present

## 2024-02-17 LAB — CBC
HCT: 28.4 % — ABNORMAL LOW (ref 36.0–46.0)
Hemoglobin: 9 g/dL — ABNORMAL LOW (ref 12.0–15.0)
MCH: 31.8 pg (ref 26.0–34.0)
MCHC: 31.7 g/dL (ref 30.0–36.0)
MCV: 100.4 fL — ABNORMAL HIGH (ref 80.0–100.0)
Platelets: 190 K/uL (ref 150–400)
RBC: 2.83 MIL/uL — ABNORMAL LOW (ref 3.87–5.11)
RDW: 21.4 % — ABNORMAL HIGH (ref 11.5–15.5)
WBC: 5.7 K/uL (ref 4.0–10.5)
nRBC: 0 % (ref 0.0–0.2)

## 2024-02-17 LAB — BASIC METABOLIC PANEL WITH GFR
Anion gap: 10 (ref 5–15)
BUN: 22 mg/dL (ref 8–23)
CO2: 30 mmol/L (ref 22–32)
Calcium: 9.4 mg/dL (ref 8.9–10.3)
Chloride: 101 mmol/L (ref 98–111)
Creatinine, Ser: 1.31 mg/dL — ABNORMAL HIGH (ref 0.44–1.00)
GFR, Estimated: 42 mL/min — ABNORMAL LOW
Glucose, Bld: 72 mg/dL (ref 70–99)
Potassium: 3.4 mmol/L — ABNORMAL LOW (ref 3.5–5.1)
Sodium: 142 mmol/L (ref 135–145)

## 2024-02-17 LAB — PRO BRAIN NATRIURETIC PEPTIDE: Pro Brain Natriuretic Peptide: 1773 pg/mL — ABNORMAL HIGH

## 2024-02-17 MED ORDER — FUROSEMIDE 10 MG/ML IJ SOLN
80.0000 mg | Freq: Once | INTRAMUSCULAR | Status: AC
  Administered 2024-02-17: 80 mg via INTRAVENOUS

## 2024-02-17 MED ORDER — FUROSEMIDE 10 MG/ML IJ SOLN
INTRAMUSCULAR | Status: AC
  Filled 2024-02-17: qty 8

## 2024-02-17 MED ORDER — POTASSIUM CHLORIDE CRYS ER 20 MEQ PO TBCR
EXTENDED_RELEASE_TABLET | ORAL | Status: AC
  Filled 2024-02-17: qty 2

## 2024-02-17 MED ORDER — POTASSIUM CHLORIDE CRYS ER 10 MEQ PO TBCR: 10.0000 meq | EXTENDED_RELEASE_TABLET | Freq: Every day | ORAL | 3 refills | Status: DC

## 2024-02-17 MED ORDER — POTASSIUM CHLORIDE CRYS ER 20 MEQ PO TBCR
40.0000 meq | EXTENDED_RELEASE_TABLET | Freq: Once | ORAL | Status: AC
  Administered 2024-02-17: 40 meq via ORAL

## 2024-02-17 MED ORDER — FUROSCIX 80 MG/10ML ~~LOC~~ CTKT
80.0000 mg | CARTRIDGE | SUBCUTANEOUS | 0 refills | Status: AC
  Filled 2024-02-17: qty 10, 10d supply, fill #0

## 2024-02-17 NOTE — Addendum Note (Signed)
 Encounter addended by: Glena Harlene HERO, FNP on: 02/17/2024 2:10 PM  Actions taken: Clinical Note Signed

## 2024-02-17 NOTE — Progress Notes (Signed)
 Specialty Pharmacy Initial Fill Coordination Note  Lindsey Huber is a 79 y.o. female contacted today regarding initial fill of specialty medication(s) Furosemide  (Furoscix , LASIX )   Patient requested [name] at [facility] at [place] date: 02/17/24   Medication will be filled on: 02/17/24    Patient is aware of $120 copayment.

## 2024-02-17 NOTE — Progress Notes (Signed)
 Remote ICD Transmission

## 2024-02-17 NOTE — Patient Instructions (Signed)
 Medication Changes:  Please report to infusion clinic today for a dose of IV Lasix  and Potassium tablets  Your provider has order Furoscix  for you. This is an on-body infuser that gives you a dose of Furosemide .   Please pick this up at [facility], we have ordered you 10 kits but only use 2, save the others and only use them if we advise you to do so  Ensure you write down the time you start your infusion so that if there is a problem you will know how long the infusion lasted  Use Furoscix  only AS DIRECTED by our office  Dosing Directions:   Day 1= Saturday 1/24 use 1 Furoscix  kit and take 40 meq of Potassium  Day 2= Sunday 1/25 use 1 Furoscix  kit and take 40 meq of Potassium  Day 3= Monday 1/26 restart Furosemide  at 60 mg (3 tabs) in AM and 40 mg (2 tabs) in PM and Potassium 10 meq Daily   Lab Work:  Labs done today, your results will be available in MyChart, we will contact you for abnormal readings.  Special Instructions // Education:  Do the following things EVERYDAY: Weigh yourself in the morning before breakfast. Write it down and keep it in a log. Take your medicines as prescribed Eat low salt foods--Limit salt (sodium) to 2000 mg per day.  Stay as active as you can everyday Limit all fluids for the day to less than 2 liters   Follow-Up in: 1 week   At the [facility], you and your health needs are our priority. We have a designated team specialized in the treatment of Heart Failure. This Care Team includes your primary Heart Failure Specialized Cardiologist (physician), Advanced Practice Providers (APPs- Physician Assistants and Nurse Practitioners), and Pharmacist who all work together to provide you with the care you need, when you need it.   You may see any of the following providers on your designated Care Team at your next follow up:  Dr. Toribio Fuel Dr. Ezra Shuck Dr. Odis Brownie Greig Mosses, NP Caffie Shed,  [name] [facility] [place], [name] Beckey Coe, NP Jordan Lee, NP Tinnie Redman, PharmD   Please be sure to bring in all your medications bottles to every appointment.   Need to Contact Us :  If you have any questions or concerns before your next appointment please send us  a message through [place] or call our office at [phone].    TO LEAVE A MESSAGE FOR THE NURSE SELECT OPTION 2, PLEASE LEAVE A MESSAGE INCLUDING: YOUR NAME DATE OF BIRTH CALL BACK NUMBER REASON FOR CALL**this is important as we prioritize the call backs  YOU WILL RECEIVE A CALL BACK THE SAME DAY AS LONG AS YOU CALL BEFORE 4:00 PM

## 2024-02-17 NOTE — Addendum Note (Signed)
 Encounter addended by: Buell Powell HERO, RN on: 02/17/2024 12:46 PM  Actions taken: Diagnosis association updated, Pharmacy for encounter modified, Order list changed, Pend clinical note, Clinical Note Signed, Charge Capture section accepted

## 2024-02-18 ENCOUNTER — Other Ambulatory Visit (HOSPITAL_COMMUNITY): Payer: Self-pay

## 2024-02-21 ENCOUNTER — Ambulatory Visit: Payer: Self-pay | Admitting: Cardiovascular Disease

## 2024-02-23 ENCOUNTER — Telehealth (HOSPITAL_COMMUNITY): Payer: Self-pay

## 2024-02-23 NOTE — Progress Notes (Incomplete)
 "   ADVANCED HF CLINIC NOTE Primary Care: Huber, Lindsey  E, PA EP Cardiologist: Dr. Cindie HF Cardiologist: Dr. Vanda  HPI: [name] is a 79 y.o. female with chronic diastolic CHF, permanent AF, AFL s/p ablation, SLE, pulmonary fibrosis, pulmonary hypertension. paroxysmal VT, OSA not on cpap and valvular disease s/p AVR/MV ring/TVR. Referred by Dr. Cindie in 10/22 for further evaluation of PAH and valvular heart disease.  Underwent  AVR/MV ring/TVR 09/25/2010.  Paroxysmal VT, ICD placed due to torsades on Flecainide with SSS requiring PPM which predated valve repar/replacement surgery 09/25/2010 and in fact ICD lead was damaged during valve replacement surgery.       History of chronic COPD and pulmonary fibrosis followed by pulmonology.  On triple therapy for COPD vs asthma, nintedanib  for IPF. Followed by Dr Annella, Did not smoke but inhaled second hand smoke from husband and mother pretty much her whole life until 01/2020.   Echo 11/20: EF 63%,normal bioprosthetic tricuspid and aortic valve with mild tricuspid regurgitation, mitral valve repair with mild mitral regurgitation, severe biatrial enlargement.   Echo 03/11/2020  LVEF 55 to 60%, moderate to severe tricuspid regurgitation, moderately elevated right ventricular systolic pressure estimate 51mm Hg, moderate mitral regurgitation.  RV not well visualized.    Seen in HF Clinic for first visit 10/27/20. Had NYHA IIIB symptoms. Referred for R/L cath--> normal coronaries, EF 60-65%, and normal left sided filling pressures.   R/LHC 11/27/20: Ao =129/63 (89) LV = 141/8 RA = 11 (v waves to 14) RV = 43/4 PA = 42/15 (25) PCW = 10 Fick cardiac output/index = 5.7/3.7 PVR = 2.1 WU SVR = 1101 PAPi = 2.45 WU. Normal coronary arteries, EF 60-65%, very mild PAH. Normal left-sided filling pressures with no significant v-waves in PCWP tracing to suggest hemodynamically significant MR. No significant stenosis across AoV, MV or TV  Admitted 11/22  with acute LGIB. CT scan showed acute GI hemorrhage involving mid descending colon, likely acute diverticular bleed. Transfused 3u RBCs. Colonoscopy 11/26 which noted hemorrhoids and diverticulosis, no active bleeding, recommended to restart Xarelto  in 3 days if stable.   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  Admitted 3/25 with GIB 2/2 diverticular bleeding. AC held then restarted. Re-admitted later in the month with GI bleeding, AC held agin. Underwent colonoscopy showing hemorrhoids/diverticulosis-no active bleeding.  AC restarted.  Admitted 11/25 with recurrent GIB. EGD showed medium size hiatal hernia, acute gastritis that was biopsied. Hgb ok   Echo 12/25 showed EF 55% RV dilated Mod AS (mean gradient 32) Marked biatrial dilation   Today she returns for HF follow up with her daughter. Overall feeling fine. Breathing has been better, she is less SOB walking around her home. She has LE swelling and weight is up to 120 lbs at home. She feels pressure at ICD site and palpitations for past week. She has dizziness when she bends over. Denies  abnormal bleeding, CP, or PND/Orthopnea. Appetite ok. Taking all medications.    Past Medical History:  Diagnosis Date   ABLA (acute blood loss anemia) 07/04/2022   Achalasia    Acquired hypothyroidism 08/20/2020   Acute lower GI bleeding 11/02/2022   Acute metabolic encephalopathy 07/25/2021   AICD (automatic cardioverter/defibrillator) present 11/14/2019   Allergic rhinitis    Ankle fracture 03/25/2022   Aortic valve disorder 03/26/2002   Atherosclerosis of abdominal aorta 05/01/2020   Cardiomyopathy (HCC)  CHB (complete heart block) (HCC) 08/18/2017   CHF (congestive heart failure) (HCC)    Cholelithiasis without obstruction 05/01/2020   Chronic gouty arthritis    Chronic heart failure with preserved ejection fraction (HCC) 05/01/2020    Chronic kidney disease (CKD), active medical management without dialysis, stage 3 (moderate) (HCC) 05/01/2020   Closed torus fracture of distal end of right radius with delayed healing 08/12/2021   Delusional thoughts (HCC)    Diverticulosis of colon 05/01/2020   Epistaxis    Essential (primary) hypertension 08/18/2017   Gastroesophageal reflux disease 05/01/2020   Gastrointestinal hemorrhage    H/O transfusion of packed red blood cells    Hemorrhagic shock (HCC) 07/02/2022   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 11/14/2019   Hypercoagulability due to atrial fibrillation (HCC) 05/01/2020   Hyperlipidemia 11/14/2019   Hypocalcemia 12/14/2020   Hypoglycemia    Hypokalemia 12/14/2020   Hypomagnesemia 12/14/2020   Hypotension 12/14/2020   Idiopathic pulmonary fibrosis (HCC) 05/01/2020   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   Iron  deficiency anemia    Long term (current) use of anticoagulants    Malnutrition of mild degree [name]: 75% to less than 90% of standard weight)    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/12/2021   Mitral and aortic incompetence 11/14/2019   Non-rheumatic atrial fibrillation (HCC) 05/01/2020   Non-toxic multinodular goiter 08/20/2020   NSVT (nonsustained ventricular tachycardia) (HCC) 08/18/2017   Oropharyngeal dysphagia    Osteoarthritis of hip 05/01/2020   Osteoarthritis of knee 05/01/2020   Osteopenia of neck of left femur    Pain of left hip joint 12/12/2017   Presence of permanent cardiac pacemaker    [employer]   Proteinuria 05/01/2020   Raynaud's disease    Recurrent falls 04/09/2021   Rheumatoid arthritis (HCC)    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     albuterol  (VENTOLIN  HFA) 108 (90 Base) MCG/ACT inhaler Inhale 2 puffs into the lungs every 6 (six) hours as needed for wheezing or shortness of breath. 2 each 6   allopurinol  (ZYLOPRIM ) 100 MG tablet TAKE 1 TABLET BY MOUTH EVERY DAY 90 tablet 3   apixaban  (ELIQUIS ) 2.5 MG TABS tablet Take 1 tablet (2.5 mg total) by mouth 2 (two) times daily. 180 tablet 3   Budeson-Glycopyrrol-Formoterol  (BREZTRI  AEROSPHERE) 160-9-4.8 MCG/ACT AERO Inhale 2 puffs into the lungs 2 (two) times daily as needed (for flares). 1 each 11   calcium -vitamin D  (OSCAL WITH D) 500-5 MG-MCG tablet Take 2 tablets by mouth 2 (two) times daily. 120 tablet 0   cetirizine  HCl (ZYRTEC ) 5 MG/5ML SOLN Take 1 mL (1 mg total) by mouth 2 (two) times daily as needed for rhinitis or allergies.     Cholecalciferol  (VITAMIN D3) 25 MCG (1000 UT) CAPS Take 1,000 Units by mouth daily with lunch.     fluticasone  (FLONASE ) 50 MCG/ACT nasal spray Place 1 spray into both nostrils as needed for allergies or rhinitis.     Furosemide  (  FUROSCIX ) 80 MG/10ML CTKT Inject 80 mg into the skin as directed. Take as directed by HF Clinic 10 each 0   furosemide  (LASIX ) 20 MG tablet Take 3 tablets (60 mg total) by mouth every morning AND 2 tablets (40 mg total) every evening. 200 tablet 6   levETIRAcetam  (KEPPRA ) 500 MG tablet Take 1 tablet (500 mg total) by mouth 2 (two) times daily. 180 tablet 1   losartan  (COZAAR ) 25 MG tablet Take 1 tablet (25 mg total) by mouth daily. 90 tablet 3   magnesium  oxide (MAG-OX) 400 (240 Mg) MG tablet TAKE 1 TABLET BY MOUTH EVERY DAY 90 tablet 3   OFEV  150 MG CAPS TAKE 1 CAPSULE BY MOUTH AT BEDTIME. 30 capsule 3   OLANZapine  (ZYPREXA ) 2.5 MG tablet TAKE 1 TABLET BY MOUTH EVERYDAY AT BEDTIME 90 tablet 1    pantoprazole  (PROTONIX ) 40 MG tablet Take 1 tablet (40 mg total) by mouth 2 (two) times daily. 60 tablet 1   polyethylene glycol powder (MIRALAX ) 17 GM/SCOOP powder Take 17 g by mouth daily as needed for mild constipation.     potassium chloride  (KLOR-CON  M) 10 MEQ tablet Take 1 tablet (10 mEq total) by mouth daily. Take extra 3 tabs when you use Furoscix  39 tablet 3   predniSONE  (DELTASONE ) 5 MG tablet TAKE 1 TABLET BY MOUTH DAILY WITH BREAKFAST. PATIENT TO DOUBLE UP DOSE DURING SICK DAY RULE 90 tablet 4   No current facility-administered medications for this visit.   Allergies  Allergen Reactions   Diprivan  [Propofol ] Shortness Of Breath and Other (See Comments)    Caused asthma   Other Other (See Comments)    Patient is to NOT EAT any foods with husks or tree nuts, popcorn   Tambocor [Flecainide] Other (See Comments)    Unknown reaction   Norvasc [Amlodipine] Other (See Comments)    Fatigue Syncope   Pacerone [Amiodarone] Other (See Comments)    Delirium Confusion Psychosis   Social History   Socioeconomic History   Marital status: Legally Separated    Spouse name: Not on file   Number of children: Not on file   Years of education: 16   Highest education level: Bachelor's degree (e.g., BA, AB, BS)  Occupational History   Occupation: 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  Tobacco Use   Smoking status: Never    Passive exposure: Past   Smokeless tobacco: Never  Vaping Use   Vaping status: Never Used  Substance and Sexual Activity   Alcohol  use: Never   Drug use: Never   Sexual activity: Not Currently  Other Topics Concern   Not on file  Social History Narrative   Right Handed    Social Drivers of Health   Tobacco Use: Low Risk (02/17/2024)   Patient History    Smoking Tobacco Use: Never    Smokeless Tobacco Use: Never    Passive Exposure: Past  Financial Resource Strain: Not on file  Food Insecurity: No Food Insecurity (12/19/2023)   Epic    Worried About  Programme Researcher, Broadcasting/film/video in the Last Year: Never true    Ran Out of Food in the Last Year: Never true  Transportation Needs: No Transportation Needs (12/19/2023)   Epic    Lack of Transportation (Medical): No    Lack of Transportation (Non-Medical): No  Physical Activity: Not on file  Stress: Not on file  Social Connections: Socially Isolated (12/19/2023)   Social Connection and Isolation Panel    Frequency of  Communication with Friends and Family: More than three times a week    Frequency of Social Gatherings with Friends and Family: More than three times a week    Attends Religious Services: Never    Database Administrator or Organizations: No    Attends Banker Meetings: Never    Marital Status: Separated  Intimate Partner Violence: Not At Risk (12/19/2023)   Epic    Fear of Current or Ex-Partner: No    Emotionally Abused: No    Physically Abused: No    Sexually Abused: No  Depression (PHQ2-9): Not on file  Alcohol  Screen: Not on file  Housing: Low Risk (12/19/2023)   Epic    Unable to Pay for Housing in the Last Year: No    Number of Times Moved in the Last Year: 0    Homeless in the Last Year: No  Utilities: Not At Risk (12/19/2023)   Epic    Threatened with loss of utilities: No  Health Literacy: Not on file   Family History  Problem Relation Age of Onset   Heart disease Mother    Hypertension Mother    Rheum arthritis Mother    Osteoarthritis Mother    Heart disease Father    Hypertension Father    Osteoarthritis Father    Heart disease Sister    Diabetes Brother    Cancer Brother    Wt Readings from Last 3 Encounters:  02/17/24 57.2 kg (126 lb)  01/30/24 53.1 kg (117 lb)  01/12/24 50.7 kg (111 lb 12.8 oz)   There were no vitals taken for this visit.  PHYSICAL EXAM: General: *** appearing. No distress  Cardiac: JVP ~*** cm. No murmurs  Resp: Lung sounds clear and equal B/L Abdomen: Soft, non-distended.  Extremities: Warm and dry.  *** edema.   Neuro: A&O x3. Affect pleasant.   MDT ICD interrogation (personally reviewed): OptiVol up and thoracic impedence down, no AF or VT, 0.1 hr/day activity, 99.3% VP.  ASSESSMENT & PLAN: Acute on Chronic HFpEF due to valvular heart disease - s/p bioprosthetic AVR/MV ring/bioprosthetic TVR 09/25/2010 - Echo 2/22: EF 55 to 60%, mod-sev TR, RVSP 51mm Hg, mod MR. AV not assessed. - RHC 11/22: Mild PAH. RA 11 (v waves to 14) PA 42/15 (25) PCWP 10 , CO/CI (Fick)  5.7/3.7, PVR 2.1 WU MVA (LV-wedge tracing) = 3.5 cm2 mean gradient across MV 3.4 mm HG - Echo 2/24: EF 50-55%, mod-sev prosthetic valve AS (mean 35), mod-sev TS (mean 10) with mild-mod TR, mild MR/MS - Echo 10/24: EF 60-65%, RV moderately reduced, moderate to severe TR, AoV mean gradient 32 mm Hg - Echo 12/25: EF 55%, RV dilated, mod AS (mean gradient 32), marked biatrial dilation  - NYHA IIIb, confounded by deconditioning. She is significantly volume overloaded today on exam and OptiVol. Weight up another 10 lbs - Use Furoscix  daily + 40 KCL daily x 3 days (hold Lasix  while using Furoscix ) - After 3 days, resume Lasix  60/40 + 10 KCL daily. - Compression hose - Continue losartan  25 mg daily - Consider spiro next visit. - Labs today. Needs BMET at follow up next week  2. 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 ILD - 11/20 HRCT with stable ILD compared with 2018. 2018 CT slowly progressive basal predominant, moderately severe pulmonary fibrosis. Favor NSIP - Followed by pulmonary   -  No change.  4. OSA - Off CPAP with weight loss  5. Permanent Afib with SSS - has ICD in place due to h/o VT  - followed by Dr. Cindie - on Eliquis  2.5 mg bid, reduced dose, presumably for GI bleeding risk.   - may need to consider discontinuation of AC with frequent falls and recurrent GIB  6. H/o VT - s/p ICD  - device interrogated  today  7. GIB - Colonoscopy 2022: Left diverticulosis - Colonoscopy 3/25: no active bleeding, suspect bleeding from colonic diverticula - 2 admission in 2025 for bleeds. - Followed by GI - CBC on 02/10/24 showed hgb 7.6. Repeat CBC today   8. SLE - Followed by Dr. Jeannetta, lupus felt to be under good control - Continued on HCQ and she is also on 5 mg prednisone  for secondary adrenal insufficiency  9. HTN - BP mildly elevated today - Diurese as above - Consider addition of spiro next visit  10. Anemia - iron  deficiency d/t blood loss and iron  deficiency - received recent iron  infusion - Recheck CBC   Follow up in 1 week with APP. She is high risk for re-admission.  Follow up in *** with ***  Hanya Guerin, NP  02/23/24  "

## 2024-02-23 NOTE — Telephone Encounter (Signed)
 Called to confirm/remind patient of their appointment at the [facility] on 02/23/24.   Appointment:   [x] Confirmed  [] Left mess   [] No answer/No voice mail  [] VM Full/unable to leave message  [] Phone not in service  Patient reminded to bring all medications and/or complete list.  Confirmed patient has transportation. Gave directions, instructed to utilize valet parking.

## 2024-02-24 ENCOUNTER — Ambulatory Visit (HOSPITAL_COMMUNITY)

## 2024-02-28 ENCOUNTER — Telehealth (HOSPITAL_COMMUNITY): Payer: Self-pay

## 2024-02-28 NOTE — Telephone Encounter (Signed)
 Called to confirm/remind patient of their appointment at the [facility] on 02/29/24.   Appointment:   [] Confirmed  [] Left mess   [] No answer/No voice mail  [] VM Full/unable to leave message  [] Phone not in service  Patient reminded to bring all medications and/or complete list.  Confirmed patient has transportation. Gave directions, instructed to utilize valet parking.

## 2024-02-29 ENCOUNTER — Encounter (HOSPITAL_COMMUNITY): Payer: Self-pay

## 2024-02-29 ENCOUNTER — Ambulatory Visit (HOSPITAL_COMMUNITY)
Admission: RE | Admit: 2024-02-29 | Discharge: 2024-02-29 | Disposition: A | Source: Ambulatory Visit | Attending: Family Medicine

## 2024-02-29 VITALS — BP 140/74 | HR 73 | Ht 63.0 in | Wt 117.0 lb

## 2024-02-29 DIAGNOSIS — J849 Interstitial pulmonary disease, unspecified: Secondary | ICD-10-CM | POA: Insufficient documentation

## 2024-02-29 DIAGNOSIS — I4821 Permanent atrial fibrillation: Secondary | ICD-10-CM | POA: Diagnosis not present

## 2024-02-29 DIAGNOSIS — I130 Hypertensive heart and chronic kidney disease with heart failure and stage 1 through stage 4 chronic kidney disease, or unspecified chronic kidney disease: Secondary | ICD-10-CM | POA: Insufficient documentation

## 2024-02-29 DIAGNOSIS — D500 Iron deficiency anemia secondary to blood loss (chronic): Secondary | ICD-10-CM | POA: Insufficient documentation

## 2024-02-29 DIAGNOSIS — Z7901 Long term (current) use of anticoagulants: Secondary | ICD-10-CM | POA: Insufficient documentation

## 2024-02-29 DIAGNOSIS — M3214 Glomerular disease in systemic lupus erythematosus: Secondary | ICD-10-CM | POA: Insufficient documentation

## 2024-02-29 DIAGNOSIS — J449 Chronic obstructive pulmonary disease, unspecified: Secondary | ICD-10-CM | POA: Insufficient documentation

## 2024-02-29 DIAGNOSIS — E2749 Other adrenocortical insufficiency: Secondary | ICD-10-CM | POA: Insufficient documentation

## 2024-02-29 DIAGNOSIS — I5032 Chronic diastolic (congestive) heart failure: Secondary | ICD-10-CM | POA: Diagnosis not present

## 2024-02-29 DIAGNOSIS — Z79899 Other long term (current) drug therapy: Secondary | ICD-10-CM | POA: Insufficient documentation

## 2024-02-29 DIAGNOSIS — Z8249 Family history of ischemic heart disease and other diseases of the circulatory system: Secondary | ICD-10-CM | POA: Insufficient documentation

## 2024-02-29 DIAGNOSIS — Z7952 Long term (current) use of systemic steroids: Secondary | ICD-10-CM | POA: Insufficient documentation

## 2024-02-29 DIAGNOSIS — G4733 Obstructive sleep apnea (adult) (pediatric): Secondary | ICD-10-CM | POA: Insufficient documentation

## 2024-02-29 DIAGNOSIS — I472 Ventricular tachycardia, unspecified: Secondary | ICD-10-CM | POA: Diagnosis not present

## 2024-02-29 DIAGNOSIS — M329 Systemic lupus erythematosus, unspecified: Secondary | ICD-10-CM | POA: Diagnosis not present

## 2024-02-29 DIAGNOSIS — I083 Combined rheumatic disorders of mitral, aortic and tricuspid valves: Secondary | ICD-10-CM | POA: Insufficient documentation

## 2024-02-29 DIAGNOSIS — K922 Gastrointestinal hemorrhage, unspecified: Secondary | ICD-10-CM | POA: Diagnosis not present

## 2024-02-29 DIAGNOSIS — I272 Pulmonary hypertension, unspecified: Secondary | ICD-10-CM | POA: Diagnosis not present

## 2024-02-29 DIAGNOSIS — Z95810 Presence of automatic (implantable) cardiac defibrillator: Secondary | ICD-10-CM | POA: Insufficient documentation

## 2024-02-29 DIAGNOSIS — Z953 Presence of xenogenic heart valve: Secondary | ICD-10-CM | POA: Insufficient documentation

## 2024-02-29 DIAGNOSIS — I10 Essential (primary) hypertension: Secondary | ICD-10-CM

## 2024-02-29 DIAGNOSIS — I495 Sick sinus syndrome: Secondary | ICD-10-CM | POA: Insufficient documentation

## 2024-02-29 LAB — BASIC METABOLIC PANEL WITH GFR
Anion gap: 12 (ref 5–15)
BUN: 28 mg/dL — ABNORMAL HIGH (ref 8–23)
CO2: 30 mmol/L (ref 22–32)
Calcium: 9.5 mg/dL (ref 8.9–10.3)
Chloride: 97 mmol/L — ABNORMAL LOW (ref 98–111)
Creatinine, Ser: 1.16 mg/dL — ABNORMAL HIGH (ref 0.44–1.00)
GFR, Estimated: 49 mL/min — ABNORMAL LOW
Glucose, Bld: 78 mg/dL (ref 70–99)
Potassium: 3.6 mmol/L (ref 3.5–5.1)
Sodium: 139 mmol/L (ref 135–145)

## 2024-02-29 LAB — PRO BRAIN NATRIURETIC PEPTIDE: Pro Brain Natriuretic Peptide: 1068 pg/mL — ABNORMAL HIGH

## 2024-02-29 MED ORDER — SPIRONOLACTONE 25 MG PO TABS: 25.0000 mg | ORAL_TABLET | Freq: Every day | ORAL | 3 refills | Status: AC

## 2024-02-29 NOTE — Patient Instructions (Addendum)
 Good to see you today!   START spironolactone  25 mg daily  STOP potassium  Labs done today, your results will be available in MyChart, we will contact you for abnormal readings.  Your physician recommends that you schedule a follow-up appointment as scheduled   If you have any questions or concerns before your next appointment please send us  a message through [place] or call our office at [phone].    TO LEAVE A MESSAGE FOR THE NURSE SELECT OPTION 2, PLEASE LEAVE A MESSAGE INCLUDING: YOUR NAME DATE OF BIRTH CALL BACK NUMBER REASON FOR CALL**this is important as we prioritize the call backs  YOU WILL RECEIVE A CALL BACK THE SAME DAY AS LONG AS YOU CALL BEFORE 4:00 PM At the [facility], you and your health needs are our priority. As part of our continuing mission to provide you with exceptional heart care, we have created designated Provider Care Teams. These Care Teams include your primary Cardiologist (physician) and Advanced Practice Providers (APPs- Physician Assistants and Nurse Practitioners) who all work together to provide you with the care you need, when you need it.   You may see any of the following providers on your designated Care Team at your next follow up: Dr Toribio Fuel Dr Ezra Shuck Dr. Morene Brownie Greig Mosses, NP Caffie Shed, [name] [facility] [place], [name] Beckey Coe, NP Jordan Lee, NP Ellouise Class, NP Tinnie Redman, PharmD Jaun Bash, PharmD   Please be sure to bring in all your medications bottles to every appointment.    Thank you for choosing [facility]

## 2024-02-29 NOTE — Addendum Note (Signed)
 Encounter addended by: Alixis Harmon M, RN on: 02/29/2024 3:11 PM  Actions taken: Charge Capture section accepted

## 2024-03-02 ENCOUNTER — Ambulatory Visit (HOSPITAL_COMMUNITY): Payer: Self-pay | Admitting: Family Medicine

## 2024-03-07 ENCOUNTER — Ambulatory Visit (HOSPITAL_COMMUNITY)

## 2024-03-29 ENCOUNTER — Other Ambulatory Visit

## 2024-05-09 ENCOUNTER — Ambulatory Visit: Admitting: Cardiovascular Disease

## 2024-05-15 ENCOUNTER — Ambulatory Visit: Payer: Medicare HMO

## 2024-05-28 ENCOUNTER — Ambulatory Visit (HOSPITAL_COMMUNITY): Admitting: Internal Medicine

## 2024-06-27 ENCOUNTER — Ambulatory Visit: Admitting: Neurology

## 2024-07-31 ENCOUNTER — Encounter: Admitting: Internal Medicine

## 2024-08-14 ENCOUNTER — Ambulatory Visit: Payer: Medicare HMO

## 2024-11-13 ENCOUNTER — Ambulatory Visit: Payer: Medicare HMO

## 2025-02-12 ENCOUNTER — Ambulatory Visit: Payer: Medicare HMO
